# Patient Record
Sex: Male | Born: 1945 | Race: Asian | Hispanic: No | Marital: Married | Smoking: Former smoker
Health system: Southern US, Community
[De-identification: ages and names within clinical notes are randomized; demographics above are authoritative.]

## PROBLEM LIST (undated history)

## (undated) DIAGNOSIS — R918 Other nonspecific abnormal finding of lung field: Secondary | ICD-10-CM

## (undated) DIAGNOSIS — E119 Type 2 diabetes mellitus without complications: Secondary | ICD-10-CM

## (undated) DIAGNOSIS — Z87438 Personal history of other diseases of male genital organs: Secondary | ICD-10-CM

## (undated) DIAGNOSIS — J439 Emphysema, unspecified: Secondary | ICD-10-CM

## (undated) DIAGNOSIS — I519 Heart disease, unspecified: Secondary | ICD-10-CM

## (undated) DIAGNOSIS — E785 Hyperlipidemia, unspecified: Secondary | ICD-10-CM

## (undated) DIAGNOSIS — Z972 Presence of dental prosthetic device (complete) (partial): Secondary | ICD-10-CM

## (undated) DIAGNOSIS — J309 Allergic rhinitis, unspecified: Secondary | ICD-10-CM

## (undated) DIAGNOSIS — J432 Centrilobular emphysema: Secondary | ICD-10-CM

## (undated) DIAGNOSIS — R300 Dysuria: Secondary | ICD-10-CM

## (undated) DIAGNOSIS — R079 Chest pain, unspecified: Secondary | ICD-10-CM

## (undated) DIAGNOSIS — J449 Chronic obstructive pulmonary disease, unspecified: Secondary | ICD-10-CM

## (undated) DIAGNOSIS — I2510 Atherosclerotic heart disease of native coronary artery without angina pectoris: Secondary | ICD-10-CM

## (undated) DIAGNOSIS — Z8679 Personal history of other diseases of the circulatory system: Secondary | ICD-10-CM

## (undated) DIAGNOSIS — Z973 Presence of spectacles and contact lenses: Secondary | ICD-10-CM

## (undated) DIAGNOSIS — Z9861 Coronary angioplasty status: Secondary | ICD-10-CM

## (undated) DIAGNOSIS — I517 Cardiomegaly: Secondary | ICD-10-CM

## (undated) DIAGNOSIS — K08109 Complete loss of teeth, unspecified cause, unspecified class: Secondary | ICD-10-CM

## (undated) DIAGNOSIS — I252 Old myocardial infarction: Secondary | ICD-10-CM

## (undated) DIAGNOSIS — C679 Malignant neoplasm of bladder, unspecified: Secondary | ICD-10-CM

## (undated) DIAGNOSIS — I10 Essential (primary) hypertension: Secondary | ICD-10-CM

## (undated) DIAGNOSIS — R319 Hematuria, unspecified: Secondary | ICD-10-CM

## (undated) DIAGNOSIS — K219 Gastro-esophageal reflux disease without esophagitis: Secondary | ICD-10-CM

## (undated) DIAGNOSIS — I700 Atherosclerosis of aorta: Secondary | ICD-10-CM

## (undated) DIAGNOSIS — R0600 Dyspnea, unspecified: Secondary | ICD-10-CM

## (undated) DIAGNOSIS — J189 Pneumonia, unspecified organism: Secondary | ICD-10-CM

## (undated) DIAGNOSIS — R1030 Lower abdominal pain, unspecified: Secondary | ICD-10-CM

## (undated) HISTORY — DX: Atherosclerotic heart disease of native coronary artery without angina pectoris: I25.10

## (undated) HISTORY — DX: Coronary angioplasty status: Z98.61

## (undated) HISTORY — DX: Gastro-esophageal reflux disease without esophagitis: K21.9

## (undated) HISTORY — PX: OTHER SURGICAL HISTORY: SHX169

## (undated) HISTORY — DX: Essential (primary) hypertension: I10

## (undated) HISTORY — PX: COLONOSCOPY: SHX174

---
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
## 2000-06-05 ENCOUNTER — Encounter: Admission: RE | Admit: 2000-06-05 | Discharge: 2000-06-05 | Payer: Self-pay | Admitting: Family Medicine

## 2000-06-05 ENCOUNTER — Encounter: Payer: Self-pay | Admitting: Family Medicine

## 2002-01-08 HISTORY — PX: ESOPHAGOGASTRODUODENOSCOPY: SHX1529

## 2002-11-04 ENCOUNTER — Inpatient Hospital Stay (HOSPITAL_COMMUNITY): Admission: EM | Admit: 2002-11-04 | Discharge: 2002-11-06 | Payer: Self-pay | Admitting: Emergency Medicine

## 2003-01-12 ENCOUNTER — Inpatient Hospital Stay (HOSPITAL_COMMUNITY): Admission: EM | Admit: 2003-01-12 | Discharge: 2003-01-14 | Payer: Self-pay | Admitting: Critical Care Medicine

## 2004-05-08 ENCOUNTER — Ambulatory Visit: Payer: Self-pay | Admitting: Critical Care Medicine

## 2004-08-02 ENCOUNTER — Ambulatory Visit: Payer: Self-pay | Admitting: Critical Care Medicine

## 2004-11-07 ENCOUNTER — Ambulatory Visit: Payer: Self-pay | Admitting: Pulmonary Disease

## 2005-11-27 ENCOUNTER — Ambulatory Visit: Payer: Self-pay | Admitting: Critical Care Medicine

## 2006-01-03 ENCOUNTER — Ambulatory Visit: Payer: Self-pay | Admitting: Critical Care Medicine

## 2006-03-26 ENCOUNTER — Ambulatory Visit: Payer: Self-pay | Admitting: Critical Care Medicine

## 2006-05-15 ENCOUNTER — Ambulatory Visit: Payer: Self-pay | Admitting: Critical Care Medicine

## 2006-11-13 ENCOUNTER — Ambulatory Visit: Payer: Self-pay | Admitting: Critical Care Medicine

## 2006-11-13 DIAGNOSIS — J449 Chronic obstructive pulmonary disease, unspecified: Secondary | ICD-10-CM

## 2006-11-13 DIAGNOSIS — K219 Gastro-esophageal reflux disease without esophagitis: Secondary | ICD-10-CM

## 2007-03-20 ENCOUNTER — Ambulatory Visit: Payer: Self-pay | Admitting: Critical Care Medicine

## 2007-05-25 ENCOUNTER — Encounter: Payer: Self-pay | Admitting: Critical Care Medicine

## 2007-06-18 ENCOUNTER — Encounter: Payer: Self-pay | Admitting: Critical Care Medicine

## 2008-01-30 ENCOUNTER — Ambulatory Visit: Payer: Self-pay | Admitting: Internal Medicine

## 2008-02-17 ENCOUNTER — Ambulatory Visit: Payer: Self-pay | Admitting: Critical Care Medicine

## 2008-03-03 ENCOUNTER — Ambulatory Visit: Payer: Self-pay | Admitting: Critical Care Medicine

## 2008-03-23 ENCOUNTER — Encounter: Payer: Self-pay | Admitting: Critical Care Medicine

## 2008-04-14 ENCOUNTER — Ambulatory Visit: Payer: Self-pay | Admitting: Critical Care Medicine

## 2008-05-19 ENCOUNTER — Ambulatory Visit: Payer: Self-pay | Admitting: Critical Care Medicine

## 2008-05-25 ENCOUNTER — Telehealth (INDEPENDENT_AMBULATORY_CARE_PROVIDER_SITE_OTHER): Payer: Self-pay | Admitting: *Deleted

## 2008-08-06 ENCOUNTER — Ambulatory Visit: Payer: Self-pay | Admitting: Critical Care Medicine

## 2008-08-27 ENCOUNTER — Ambulatory Visit: Payer: Self-pay | Admitting: Critical Care Medicine

## 2008-10-12 ENCOUNTER — Telehealth: Payer: Self-pay | Admitting: Critical Care Medicine

## 2008-10-12 ENCOUNTER — Ambulatory Visit: Payer: Self-pay | Admitting: Critical Care Medicine

## 2008-11-09 ENCOUNTER — Telehealth: Payer: Self-pay | Admitting: Critical Care Medicine

## 2009-02-24 ENCOUNTER — Ambulatory Visit: Payer: Self-pay | Admitting: Critical Care Medicine

## 2009-05-02 ENCOUNTER — Ambulatory Visit: Payer: Self-pay | Admitting: Critical Care Medicine

## 2009-05-12 ENCOUNTER — Ambulatory Visit: Payer: Self-pay | Admitting: Critical Care Medicine

## 2009-05-13 ENCOUNTER — Encounter: Payer: Self-pay | Admitting: Critical Care Medicine

## 2009-05-31 ENCOUNTER — Inpatient Hospital Stay (HOSPITAL_COMMUNITY): Admission: AD | Admit: 2009-05-31 | Discharge: 2009-06-09 | Payer: Self-pay | Admitting: Critical Care Medicine

## 2009-05-31 ENCOUNTER — Ambulatory Visit: Payer: Self-pay | Admitting: Internal Medicine

## 2009-05-31 ENCOUNTER — Ambulatory Visit: Payer: Self-pay | Admitting: Critical Care Medicine

## 2009-05-31 DIAGNOSIS — J441 Chronic obstructive pulmonary disease with (acute) exacerbation: Secondary | ICD-10-CM

## 2009-06-02 ENCOUNTER — Encounter: Payer: Self-pay | Admitting: Cardiology

## 2009-06-07 ENCOUNTER — Encounter: Payer: Self-pay | Admitting: Internal Medicine

## 2009-06-08 ENCOUNTER — Encounter: Payer: Self-pay | Admitting: Critical Care Medicine

## 2009-06-08 ENCOUNTER — Ambulatory Visit: Payer: Self-pay | Admitting: Cardiovascular Disease

## 2009-06-08 HISTORY — PX: LEFT HEART CATH AND CORONARY ANGIOGRAPHY: CATH118249

## 2009-06-13 ENCOUNTER — Ambulatory Visit: Payer: Self-pay | Admitting: Critical Care Medicine

## 2009-06-19 ENCOUNTER — Inpatient Hospital Stay (HOSPITAL_COMMUNITY): Admission: EM | Admit: 2009-06-19 | Discharge: 2009-06-22 | Payer: Self-pay | Admitting: Emergency Medicine

## 2009-06-19 ENCOUNTER — Ambulatory Visit: Payer: Self-pay | Admitting: Internal Medicine

## 2009-06-19 DIAGNOSIS — I252 Old myocardial infarction: Secondary | ICD-10-CM

## 2009-06-19 HISTORY — DX: Old myocardial infarction: I25.2

## 2009-06-22 ENCOUNTER — Encounter: Payer: Self-pay | Admitting: Internal Medicine

## 2009-06-24 ENCOUNTER — Encounter: Payer: Self-pay | Admitting: Internal Medicine

## 2009-07-01 ENCOUNTER — Ambulatory Visit: Payer: Self-pay | Admitting: Critical Care Medicine

## 2009-07-20 ENCOUNTER — Ambulatory Visit: Payer: Self-pay | Admitting: Cardiology

## 2009-08-15 ENCOUNTER — Telehealth: Payer: Self-pay | Admitting: Internal Medicine

## 2009-08-31 ENCOUNTER — Ambulatory Visit: Payer: Self-pay | Admitting: Critical Care Medicine

## 2009-09-13 ENCOUNTER — Ambulatory Visit: Payer: Self-pay | Admitting: Internal Medicine

## 2010-02-05 LAB — CONVERTED CEMR LAB
ALT: 24 units/L (ref 0–53)
Albumin: 3.8 g/dL (ref 3.5–5.2)
Alkaline Phosphatase: 64 units/L (ref 39–117)
BUN: 13 mg/dL (ref 6–23)
CO2: 27 meq/L (ref 19–32)
Chloride: 108 meq/L (ref 96–112)
Cholesterol: 133 mg/dL (ref 0–200)
Glucose, Bld: 84 mg/dL (ref 70–99)
LDL Cholesterol: 68 mg/dL (ref 0–99)
Total CHOL/HDL Ratio: 4
Total Protein: 6.1 g/dL (ref 6.0–8.3)

## 2010-02-09 NOTE — Assessment & Plan Note (Signed)
Summary: 2 MONTH ROV/SL  Medications Added MIGRAINE RELIEF 250-250-65 MG TABS (ASPIRIN-ACETAMINOPHEN-CAFFEINE) as needed ASPIRIN 81 MG TABS (ASPIRIN) one daily      Allergies Added: NKDA  History of Present Illness: Mr. [name] 64 year old Asian male patient with a h/o severe COPD  who was admitted June 2011 with a non-ST elevation MI in the setting severe COPD flare. Underwent cardiac cath which showed non-obstructive CAD LAD 40-50%, LCX 40%, RCA 25% EF 65%. Readmitted 2 weeks later with CP and mildly elevated troponin 0.57. He had a Lexiscan Myoview prior to discharge showed an ejection fraction in 68% without evidence of ischemia or infarct.  Since that time has followed with Dr. Delford Field and says his breathing is much better. He walks 20-30 minutes each day with CP. He does get SOB if he goes fast. Compliant with all meds.  Not taking ASA. Says he is no longer smoking.   Current Medications (verified): 1)  Brovana 15 Mcg/30ml  Nebu (Arformoterol Tartrate) .... One in [place] Two Times A Day 2)  Qvar 80 Mcg/act  Aers (Beclomethasone Dipropionate) .... Three  Puffs Twice Daily 3)  Xopenex Hfa 45 Mcg/act  Aero (Levalbuterol Tartrate) .... One To Two Puff Q4h As Needed 4)  Prednisone 10 Mg  Tabs (Prednisone) .... Take One By Mouth Once Daily 5)  Spiriva Handihaler 18 Mcg Caps (Tiotropium Bromide Monohydrate) .... Inhale Contents of 1 Capsule Once A Day 6)  Omeprazole 20 Mg Cpdr (Omeprazole) .[name].. 1 Once Daily 7)  Nitrostat 0.4 Mg Subl (Nitroglycerin) .... As Directed Prn 8)  Amlodipine Besylate 5 Mg Tabs (Amlodipine Besylate) .[name].. 1 Once Daily 9)  Lipitor 80 Mg Tabs (Atorvastatin Calcium) .[name].. 1 Once Daily 10)  Imdur 30 Mg Xr24h-Tab (Isosorbide Mononitrate) .... 1/2 Tab Once Daily 11)  Migraine Relief 250-250-65 Mg Tabs (Aspirin-Acetaminophen-Caffeine) .... As Needed  Allergies (verified): No Known Drug Allergies  Past History:  Past Medical History: COPD, exsmoker GERD NSTEM 6/11 -  thought due to vasospasm    -- cath 6/11: CAD LAD 40-50%, LCX 40%, RCA 25% EF 65%.    -- Lexiscan Myoview 6/11: EF 68% without evidence of ischemia or infarct. HTN  Review of Systems       As per HPI and past medical history; otherwise all systems negative.   Vital Signs:  Patient profile:   65 year old male Height:      59 inches Weight:      129 pounds BMI:     26.15 Pulse rate:   82 / minute Resp:     16 per minute BP sitting:   134 / 86  (left arm)  Vitals Entered By: Marrion Coy, CNA (September 13, 2009 8:37 AM)  Physical Exam  General:   Well-nournished, in no acute distress. Neck: No JVD, HJR, Bruit, or thyroid enlargement. ? small cyst above sternal notch Lungs: Decreased breath sounds throughout. no wheezing Cardiovascular: RRR, PMI not displaced, heart sounds normal, no murmurs, + s4 Abdomen: BS normal. Soft without organomegaly, masses, lesions or tenderness. Extremities No edema or cyanosis. severe clubbing SKin: Warm, no lesions or rashes  Musculoskeletal: No deformities Neuro: no focal signs    Impression & Recommendations:  Problem # 1:  CAD (ICD-414.00) Non-obstructive. Continue risf factor modifcation. Add ASA 81. Also Imdur fo possible vasospasm.   Problem # 2:  Hyperlipidemia Goal LDL < 70. Continue lipitor. check labs today.   Problem # 3:  ENCOUNTER FOR LONG-TERM USE OF OTHER MEDICATIONS (ICD-V58.69)  Other  Orders: EKG w/ Interpretation (93000) TLB-BMP (Basic Metabolic Panel-BMET) (80048-METABOL) TLB-Lipid Panel (80061-LIPID) TLB-Hepatic/Liver Function Pnl (80076-HEPATIC)  Patient Instructions: 1)  Your physician recommends that you schedule a follow-up appointment in: 1 yr with Dr Gala Romney 2)  Your physician recommends that you have lab work today:  BMP  Lipid and Liver 3)  Your physician has recommended you make the following change in your medication: Start Asprin 81 mg a day

## 2010-02-09 NOTE — Assessment & Plan Note (Signed)
Summary: Admission History and Physical   CC:  Acute visit.  Pt c/o increased SOB x several days.  He also c/o chest tightness.  Pt states that he wakes in the night with SOB and sweating.  He has had prod cough with white sputum.[name]  History of Present Illness: This is a 65 year old Asian male here for follow-up of chronic obstructive lung disease. FEV1 1.07 -63%, ratio of 47 (2008)  The patient maintains nebulize therapy-Brovana  twice daily and chronic corticosteroids- Prednisone 10mg  daily and  Qvar 80  May 12, 2009--Presents for an acute office visit. Complains of worsening sob, wheezing, dry cough   Mucus is thick and hard to get up.  Last visit 2 weeks ago, tx w/ Avelox and steroid burst.  We called the pharmacy and verified he is filling meds on consistent basis. Denies chest pain, dyspnea, orthopnea, hemoptysis, fever, n/v/d, edema, headache  May 31, 2009 10:47 AM Pt unimproved.  Pt still with cough and wheeze.  Maintains prednisone and brovana.  Not any better. Still with dry cough.  Very tight in breathing.  Symptoms are progressively worse. Pt in office 4 times in last 6weeks without improvement. May 31, 2009 10:47 AM Still with cough,  feels dizzy and sweaty.  Not getting any air and is tight in the chest   Preventive Screening-Counseling & Management  Alcohol-Tobacco     Smoking Status: quit > 6 months  Current Medications (verified): 1)  Brovana 15 Mcg/73ml  Nebu (Arformoterol Tartrate) .... One in [place] Two Times A Day 2)  Qvar 80 Mcg/act  Aers (Beclomethasone Dipropionate) .... Three  Puffs Twice Daily 3)  Xopenex Hfa 45 Mcg/act  Aero (Levalbuterol Tartrate) .... One To Two Puff Q4h As Needed 4)  Omeprazole 20 Mg  Tbec (Omeprazole) .[name].. 1 By Mouth Daily 5)  Prednisone 10 Mg  Tabs (Prednisone) .... Take One By Mouth Once Daily  Allergies (verified): No Known Drug Allergies  Past History:  Past medical, surgical, family and social histories (including risk factors)  reviewed, and no changes noted (except as noted below).  Past Medical History: COPD, exsmoker GERD  Family History: Reviewed history from 03/20/2007 and no changes required. non contributory  Social History: Reviewed history from 03/20/2007 and no changes required. Patient states former smoker.   Review of Systems       The patient complains of shortness of breath with activity, shortness of breath at rest, productive cough, non-productive cough, and chest pain.  The patient denies coughing up blood, irregular heartbeats, acid heartburn, indigestion, loss of appetite, weight change, abdominal pain, difficulty swallowing, sore throat, tooth/dental problems, headaches, nasal congestion/difficulty breathing through nose, sneezing, itching, ear ache, anxiety, depression, hand/feet swelling, joint stiffness or pain, rash, change in color of mucus, and fever.    Vital Signs:  Patient profile:   65 year old male Weight:      123 pounds O2 Sat:      96 % on Room air Temp:     97.5 degrees F oral Pulse rate:   88 / minute BP sitting:   150 / 90  (left arm)  Vitals Entered By: Vernie Murders (May 31, 2009 10:33 AM)  O2 Flow:  Room air  Physical Exam  General:  dyspneic.   Head:  normocephalic and atraumatic Eyes:  PERRLA/EOM intact; conjunctiva and sclera clear Ears:  TMs intact and clear with normal canals Nose:  clear nasal discharge.   Mouth:  no deformity or lesions Neck:  no  masses, thyromegaly, or abnormal cervical nodes Chest Wall:  no deformities noted Lungs:  decreased BS bilateral and wheezes bilateral.   Heart:  regular rate and rhythm, S1, S2 without murmurs, rubs, gallops, or clicks Abdomen:  bowel sounds positive; abdomen soft and non-tender without masses, or organomegaly Genitalia:  normal male, testes descended bilaterally without masses, no hernias noted Msk:  no deformity or scoliosis noted with normal posture Pulses:  pulses normal Extremities:  no clubbing,  cyanosis, edema, or deformity noted Neurologic:  CN II-XII grossly intact with normal reflexes, coordination, muscle strength and tone Skin:  intact without lesions or rashes Cervical Nodes:  no significant adenopathy Axillary Nodes:  no significant adenopathy Inguinal Nodes:  no significant adenopathy Psych:  anxious.     Impression & Recommendations:  Problem # 1:  COPD (ICD-496) Assessment Deteriorated COPD exacerbation, failed outpatient treatment plan admit IV medrol IV rocephin oxygen cxr labs see orders  Complete Medication List: 1)  Brovana 15 Mcg/53ml Nebu (Arformoterol tartrate) .... One in neb two times a day 2)  Qvar 80 Mcg/act Aers (Beclomethasone dipropionate) .... Three  puffs twice daily 3)  Xopenex Hfa 45 Mcg/act Aero (Levalbuterol tartrate) .... One to two puff q4h as needed 4)  Omeprazole 20 Mg Tbec (Omeprazole) .[name].. 1 by mouth daily 5)  Prednisone 10 Mg Tabs (Prednisone) .... Take one by mouth once daily  Other Orders: No Charge Patient Arrived (NCPA0) (NCPA0)  Patient Instructions: 1)  You will be admitted for inpatient care at [facility]

## 2010-02-09 NOTE — Miscellaneous (Signed)
Summary: CXR   Clinical Lists Changes  Observations: Added new observation of CXR RESULTS: Findings: The chest is hyperexpanded but the lungs are clear. Heart size is normal.  No pleural effusion or focal bony abnormality.   IMPRESSION: COPD without acute disease. (05/12/2009 13:30)      CXR  Procedure date:  05/12/2009  Findings:      Findings: The chest is hyperexpanded but the lungs are clear. Heart size is normal.  No pleural effusion or focal bony abnormality.   IMPRESSION: COPD without acute disease.

## 2010-02-09 NOTE — Assessment & Plan Note (Signed)
Summary: Pulmonary OV   CC:  2 month follow up.  Pt states breathing and cough have improved.  Only having "a little" SOB when "working hard."  Coughing at times - occ prod with a small amount of white mucus.  Denies wheezing and chest tightness.  Pt states he is no longer smoking.[name]  History of Present Illness: 65 year old Asian male patient with a known history of chronic obstructive pulmonary disease with FEV1 of 63% associated CAD non obstructed with recent Non STEMI 6/11.  Hyperlipidemia and HTN   August 31, 2009 2:27 PM Now new issues. Less dyspneic. Less cough. Less mucus and no chest pain since on new cardiac meds. The pt is no longer smoking.   Pt denies any significant sore throat, nasal congestion or excess secretions, fever, chills, sweats, unintended weight loss, pleurtic or exertional chest pain, orthopnea PND, or leg swelling Pt denies any increase in rescue therapy over baseline, denies waking up needing it or having any early am or nocturnal exacerbations of coughing/wheezing/or dyspnea.    Preventive Screening-Counseling & Management  Alcohol-Tobacco     Smoking Status: current     Smoking Cessation Counseling: yes     Year Quit: 2011     Tobacco Counseling: to remain off tobacco products  Current Medications (verified): 1)  Brovana 15 Mcg/63ml  Nebu (Arformoterol Tartrate) .... One in [place] Two Times A Day 2)  Qvar 80 Mcg/act  Aers (Beclomethasone Dipropionate) .... Three  Puffs Twice Daily 3)  Xopenex Hfa 45 Mcg/act  Aero (Levalbuterol Tartrate) .... One To Two Puff Q4h As Needed 4)  Prednisone 10 Mg  Tabs (Prednisone) .... Take One By Mouth Once Daily 5)  Spiriva Handihaler 18 Mcg Caps (Tiotropium Bromide Monohydrate) .... Inhale Contents of 1 Capsule Once A Day 6)  Omeprazole 20 Mg Cpdr (Omeprazole) .[name].. 1 Once Daily 7)  Nitrostat 0.4 Mg Subl (Nitroglycerin) .... As Directed Prn 8)  Amlodipine Besylate 5 Mg Tabs (Amlodipine Besylate) .[name].. 1 Once Daily 9)  Lipitor 80  Mg Tabs (Atorvastatin Calcium) .[name].. 1 Once Daily 10)  Imdur 30 Mg Xr24h-Tab (Isosorbide Mononitrate) .... 1/2 Tab Once Daily  Allergies (verified): No Known Drug Allergies  Past History:  Past medical, surgical, family and social histories (including risk factors) reviewed, and no changes noted (except as noted below).  Past Medical History: Reviewed history from 05/31/2009 and no changes required. COPD, exsmoker GERD  Past Surgical History: Reviewed history from 07/19/2009 and no changes required. He has had his left foot and ankle repaired.      Family History: Reviewed history from 03/20/2007 and no changes required. non contributory  Social History: Reviewed history from 07/19/2009 and no changes required. Patient states former smoker.  He rolled his own cigarettes since the age of 68, but   quit smoking 6 months ago.  Occasional EtOH.  He is married, with 4   children.  He is disabled secondary to lung disease.   Review of Systems       The patient complains of shortness of breath with activity.  The patient denies shortness of breath at rest, productive cough, non-productive cough, coughing up blood, chest pain, irregular heartbeats, acid heartburn, indigestion, loss of appetite, weight change, abdominal pain, difficulty swallowing, sore throat, tooth/dental problems, headaches, nasal congestion/difficulty breathing through nose, sneezing, itching, ear ache, anxiety, depression, hand/feet swelling, joint stiffness or pain, rash, change in color of mucus, and fever.    Vital Signs:  Patient profile:   65 year old male  Height:      59 inches Weight:      129.50 pounds BMI:     26.25 O2 Sat:      96 % on Room air Temp:     97.9 degrees F oral Pulse rate:   89 / minute BP sitting:   126 / 76  (left arm) Cuff size:   regular  Vitals Entered By: Gweneth Dimitri RN (August 31, 2009 2:09 PM)  O2 Flow:  Room air CC: 2 month follow up.  Pt states breathing and cough have  improved.  Only having "a little" SOB when "working hard."  Coughing at times - occ prod with a small amount of white mucus.  Denies wheezing and chest tightness.  Pt states he is no longer smoking. Comments Medications reviewed with patient Daytime contact number verified with patient. 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   Maintain treatment program as currently prescribed.  Complete Medication List: 1)  Brovana 15 Mcg/6ml Nebu (Arformoterol tartrate) .... One in neb two times a day 2)  Qvar 80 Mcg/act Aers (Beclomethasone dipropionate) .... Three  puffs twice daily 3)  Xopenex Hfa 45 Mcg/act Aero (Levalbuterol tartrate) .... One to two puff q4h as needed 4)  Prednisone 10 Mg Tabs (Prednisone) .... Take one by mouth once daily 5)  Spiriva Handihaler 18 Mcg Caps (Tiotropium bromide monohydrate) .... Inhale contents of 1 capsule once a day 6)  Omeprazole 20 Mg Cpdr (Omeprazole) .[name].. 1 once daily 7)  Nitrostat 0.4 Mg Subl (Nitroglycerin) .... As directed prn 8)  Amlodipine Besylate 5 Mg Tabs (Amlodipine besylate) .[name].. 1 once daily 9)  Lipitor 80 Mg Tabs (Atorvastatin calcium) .[name].. 1 once daily 10)  Imdur 30 Mg Xr24h-tab (Isosorbide mononitrate) .... 1/2 tab once daily  Other Orders: Est. Patient Level III  (16109)  Patient Instructions: 1)  No change in medications 2)  Return in     4     months

## 2010-02-09 NOTE — Assessment & Plan Note (Signed)
Summary: Pulmonary  OV   CC:  2 month follow up.  c/o increased SOB, prod cough with white mucus, fever, and wheezing and chest tightness x 2wks.  .  History of Present Illness: This is a 65 year old Asian male here for follow-up of chronic obstructive lung disease.  The patient maintains nebulize therapy-Brovana  twice daily and chronic corticosteroids- Prednisone 10mg  daily .  Qvar 40 is  maintain twice daily as well.      October 12, 2008 9:24 AM Still coughing,  but is better.  Mucous is beige still.  Dyspnea is the same.  Some chest tightness  February 24, 2009 1:39 PM Still with cough and clear.  The mucous is not discolored.  The pt notes more dyspnea with exertion.   The pt notes more chest burning.     May 02, 2009 11:32 AM Not much bette,  bad days.  Pain in chest .  Yellow mucous still Cont to burn in chest  at last ov we: Avelox one daily for 5 days and stop Dexilant one a day until samples gone then omeprazole one daily At the last ov this helped significantly  Preventive Screening-Counseling & Management  Alcohol-Tobacco     Smoking Status: quit > 6 months  Current Medications (verified): 1)  Brovana 15 Mcg/21ml  Nebu (Arformoterol Tartrate) .... One in [place] Two Times A Day 2)  Qvar 80 Mcg/act  Aers (Beclomethasone Dipropionate) .... Three  Puffs Twice Daily 3)  Xopenex Hfa 45 Mcg/act  Aero (Levalbuterol Tartrate) .... One To Two Puff Q4h As Needed 4)  Omeprazole 20 Mg  Tbec (Omeprazole) .[name].. 1 By Mouth Daily 5)  Prednisone 10 Mg  Tabs (Prednisone) .... One Daily  Allergies (verified): No Known Drug Allergies  Past History:  Past medical, surgical, family and social histories (including risk factors) reviewed, and no changes noted (except as noted below).  Past Medical History: Reviewed history from 11/13/2006 and no changes required. Asthma GERD  Family History: Reviewed history from 03/20/2007 and no changes required. non contributory  Social  History: Reviewed history from 03/20/2007 and no changes required. Patient states former smoker.   Review of Systems       The patient complains of shortness of breath with activity, shortness of breath at rest, productive cough, and non-productive cough.  The patient denies coughing up blood, chest pain, irregular heartbeats, acid heartburn, indigestion, loss of appetite, weight change, abdominal pain, difficulty swallowing, sore throat, tooth/dental problems, headaches, nasal congestion/difficulty breathing through nose, sneezing, itching, ear ache, anxiety, depression, hand/feet swelling, joint stiffness or pain, rash, change in color of mucus, and fever.    Vital Signs:  Patient profile:   65 year old male Height:      59 inches Weight:      120.13 pounds BMI:     24.35 O2 Sat:      97 % on Room air Temp:     97.4 degrees F oral Pulse rate:   71 / minute BP sitting:   126 / 80  (left arm) Cuff size:   regular  Vitals Entered By: Gweneth Dimitri RN (May 02, 2009 11:20 AM)  O2 Flow:  Room air CC: 2 month follow up.  c/o increased SOB, prod cough with white mucus, fever, wheezing and chest tightness x 2wks.   Comments Medications reviewed with patient Daytime contact number verified with patient. Gweneth Dimitri RN  May 02, 2009 11:21 AM    Physical Exam  Additional Exam:  Gen: Pleasant, well-nourished, in no distress , normal affect ENT: no lesions, no post nasal drip Neck: No JVD, no TMG, no carotid bruits Lungs: No use of accessory muscles, no dullness to percussion, distant BS, scattered rhonchi and expiratory wheeze Cardiovascular: RRR, heart sounds normal, no murmurs or gallops, no peripheral edema Abdomen: soft and non-tender, no HSM, BS normal Musculoskeletal: No deformities, no cyanosis or clubbing Neuro: alert, non-focal     Impression & Recommendations:  Problem # 1:  OBSTRUCTIVE CHRONIC BRONCHITIS (ICD-491.20) Assessment Deteriorated acute  tracheobronchitis with COPD exacerbation. Plan Avelox for 5 days. Pulse prednisone Continue inhaled medications as prescribed Orders: Est. Patient Level IV (04540) Prescription Created Electronically [phone])  Medications Added to Medication List This Visit: 1)  Prednisone 10 Mg Tabs (Prednisone) .... 4 each am x 4 days, 3 x 4 days, 2 x 4 days, then one a day and stay 2)  Avelox 400 Mg Tabs (Moxifloxacin hcl) .... By mouth daily 3)  Dexilant 60 Mg Cpdr (Dexlansoprazole) .... One by mouth daily until samples gone then restart omeprazole daily  Complete Medication List: 1)  Brovana 15 Mcg/41ml Nebu (Arformoterol tartrate) .... One in neb two times a day 2)  Qvar 80 Mcg/act Aers (Beclomethasone dipropionate) .... Three  puffs twice daily 3)  Xopenex Hfa 45 Mcg/act Aero (Levalbuterol tartrate) .... One to two puff q4h as needed 4)  Omeprazole 20 Mg Tbec (Omeprazole) .[name].. 1 by mouth daily 5)  Prednisone 10 Mg Tabs (Prednisone) .... 4 each am x 4 days, 3 x 4 days, 2 x 4 days, then one a day and stay 6)  Avelox 400 Mg Tabs (Moxifloxacin hcl) .... By mouth daily 7)  Dexilant 60 Mg Cpdr (Dexlansoprazole) .... One by mouth daily until samples gone then restart omeprazole daily  Patient Instructions: 1)  Avelox one daily for 5 days and stop 2)  Dexilant one a day until samples gone then omeprazole one daily 3)  Prednisone 10mg  4 each am x 4 days, 3 x 4 days, 2 x 4 days, then resume one daily 4)  No change in inhalers/nebulizer medications 5)  Return 2 months  Prescriptions: PREDNISONE 10 MG  TABS (PREDNISONE) 4 each am x 4 days, 3 x 4 days, 2 x 4 days, then one a day and stay  #100 x 6   Entered and Authorized by:   Storm Frisk MD   Signed by:   Storm Frisk MD on 05/02/2009   Method used:   Electronically to        [facility] [address] [phone]* (retail)       [address]       [place], [place]  [address]       Ph: [phone] or [phone]       Fax:  [phone]   RxID:   [id number]

## 2010-02-09 NOTE — Assessment & Plan Note (Signed)
Summary: SOB///kp   CC:  c/o worsening sob and  1 week - fatigue.  History of Present Illness: This is a 65 year old Asian male here for follow-up of chronic obstructive lung disease. FEV1 1.07 -63%, ratio of 47 (2008)  The patient maintains nebulize therapy-Brovana  twice daily and chronic corticosteroids- Prednisone 10mg  daily and  Qvar 80   October 12, 2008 9:24 AM Still coughing,  but is better.  Mucous is beige still.  Dyspnea is the same.  Some chest tightness  February 24, 2009 1:39 PM Still with cough and clear.  The mucous is not discolored.  The pt notes more dyspnea with exertion.   The pt notes more chest burning.     May 02, 2009 11:32 AM Not much bette,  bad days.  Pain in chest .  Yellow mucous still Cont to burn in chest  at last ov we: Avelox one daily for 5 days and stop Dexilant one a day until samples gone then omeprazole one daily At the last ov this helped significantly  May 12, 2009--Presents for an acute office visit. Complains of worsening sob, wheezing, dry cough   Mucus is thick and hard to get up.  Last visit 2 weeks ago, tx w/ Avelox and steroid burst.  We called the pharmacy and verified he is filling meds on consistent basis. Denies chest pain, dyspnea, orthopnea, hemoptysis, fever, n/v/d, edema, headache  Current Medications (verified): 1)  Brovana 15 Mcg/58ml  Nebu (Arformoterol Tartrate) .... One in [place] Two Times A Day 2)  Qvar 80 Mcg/act  Aers (Beclomethasone Dipropionate) .... Three  Puffs Twice Daily 3)  Xopenex Hfa 45 Mcg/act  Aero (Levalbuterol Tartrate) .... One To Two Puff Q4h As Needed 4)  Omeprazole 20 Mg  Tbec (Omeprazole) .[name].. 1 By Mouth Daily 5)  Prednisone 10 Mg  Tabs (Prednisone) .... Take One By Mouth Once Daily  Allergies (verified): No Known Drug Allergies  Comments:  Nurse/Medical Assistant: The patient's medications and allergies were reviewed with the patient and were updated in the Medication and Allergy Lists.  Past  History:  Past Medical History: Last updated: 11/13/2006 Asthma GERD  Family History: Last updated: 03/20/2007 non contributory  Social History: Last updated: 03/20/2007 Patient states former smoker.   Risk Factors: Smoking Status: quit > 6 months (05/02/2009)  Review of Systems      See HPI  Vital Signs:  Patient profile:   65 year old male Height:      59 inches Weight:      120 pounds BMI:     24.32 O2 Sat:      96 % on Room air Temp:     97.1 degrees F oral Pulse rate:   87 / minute BP sitting:   142 / 86  (left arm) Cuff size:   regular  Vitals Entered By: Abigail Miyamoto RN (May 12, 2009 10:14 AM)  O2 Flow:  Room air  Physical Exam  Additional Exam:  Gen: Pleasant, well-nourished, in no distress , normal affect ENT: no lesions, no post nasal drip Neck: No JVD, no TMG, no carotid bruits Lungs: No use of accessory muscles, no dullness to percussion, distant BS, faint exp wheeze  Cardiovascular: RRR, heart sounds normal, no murmurs or gallops, no peripheral edema Abdomen: soft and non-tender, no HSM, BS normal Musculoskeletal: No deformities, no cyanosis or clubbing Neuro: alert, non-focal     Impression & Recommendations:  Problem # 1:  OBSTRUCTIVE CHRONIC BRONCHITIS (ICD-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 CXR today reviewed w/ pt w/ no acute process noted.  REC:  Increase Prednisone 10mg  4 tabs once daily 4 days, 3 tabs daily for 4 days, 3 tabs x 4 days, 2 tabs x 4 days then 1 by mouth once daily  Dexilant 60mg  once daily until samples are gone then get over the counter Prilosec 20mg  once daily  Mucinex DM two times a day as needed cough/congestion Begin Zyrtec 10mg  at bedtime -this is over the counter  Please contact office for sooner follow up if symptoms do not improve or worsen  follow up Dr. Delford Field in 6 weeks  Orders: T-2 View CXR (71020TC) Est. Patient Level IV (11914)  Medications Added to Medication List This Visit: 1)  Prednisone 10 Mg Tabs  (Prednisone) .... Take one by mouth once daily 2)  Prednisone 10 Mg Tabs (Prednisone) .... 4 tabs for4 days, then 3 tabs for 4 days, 2 tabs for 4 days, then 1 tab for 4 days, then stop  Complete Medication List: 1)  Brovana 15 Mcg/53ml Nebu (Arformoterol tartrate) .... One in neb two times a day 2)  Qvar 80 Mcg/act Aers (Beclomethasone dipropionate) .... Three  puffs twice daily 3)  Xopenex Hfa 45 Mcg/act Aero (Levalbuterol tartrate) .... One to two puff q4h as needed 4)  Omeprazole 20 Mg Tbec (Omeprazole) .[name].. 1 by mouth daily 5)  Prednisone 10 Mg Tabs (Prednisone) .... Take one by mouth once daily 6)  Prednisone 10 Mg Tabs (Prednisone) .... 4 tabs for4 days, then 3 tabs for 4 days, 2 tabs for 4 days, then 1 tab for 4 days, then stop  Other Orders: Nebulizer Tx (78295)  Patient Instructions: 1)  Increase Prednisone 10mg  4 tabs once daily 4 days, 3 tabs daily for 4 days, 3 tabs x 4 days, 2 tabs x 4 days then 1 by mouth once daily  2)  Dexilant 60mg  once daily until samples are gone then get over the counter Prilosec 20mg  once daily  3)  Mucinex DM two times a day as needed cough/congestion 4)  Begin Zyrtec 10mg  at bedtime -this is over the counter  5)  Please contact office for sooner follow up if symptoms do not improve or worsen  6)  follow up Dr. Delford Field in 6 weeks  Prescriptions: PREDNISONE 10 MG TABS (PREDNISONE) 4 tabs for4 days, then 3 tabs for 4 days, 2 tabs for 4 days, then 1 tab for 4 days, then stop  #40 x 0   Entered and Authorized by:   Rubye Oaks NP   Signed by:   Rubina Basinski NP on 05/12/2009   Method used:   [facility]        [facility] [address] [phone]* (retail)       [address]       [place], [place]  [address]       Ph: [phone] or [phone]       Fax: [phone]   RxID:   [id number]      Medication Administration  Medication # 1:    Medication: Xopenex 1.25mg     Diagnosis: ASTHMA (ICD-493.90)    Dose:  1.25mg     Route: inhaled    Exp Date: 09-11    Lot #: [id number]    Mfr: SEPRACOR    Patient tolerated medication without complications    Given by: Elray Buba RN (May 12, 2009 12:05 PM)  Orders Added: 1)  T-2 View CXR [71020TC] 2)  Nebulizer Tx [id number] 3)  Est. Patient Level IV [21308]

## 2010-02-09 NOTE — Assessment & Plan Note (Signed)
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 Cardiac catheterization was not done because he had had a catheter June 09, 2009 revealing minimal nonobstructive coronary artery disease. Medical management was recommended. He also had a Lexus scan Myoview prior to discharge showed an ejection fraction in 68% without evidence of ischemia or infarct.  The patient stopped all his medications when they ran out. He complains of daily chest tightness with very little activity such as cooking or cleaning around the house. He denies radiation of the pain to his neck or arm but does have associated dyspnea. He denies diaphoresis dizziness or presyncope. The patient did not seem to understand the need to continue on his medications.  I also spoke with the patient about smoking cessation but he claims that he has stopped for the past 6 months.  Current Medications (verified): 1)  Brovana 15 Mcg/58ml  Nebu (Arformoterol Tartrate) .... One in [place] Two Times A Day 2)  Qvar 80 Mcg/act  Aers (Beclomethasone Dipropionate) .... Three  Puffs Twice Daily 3)  Xopenex Hfa 45 Mcg/act  Aero (Levalbuterol Tartrate) .... One To Two Puff Q4h As Needed 4)  Prednisone 10 Mg  Tabs (Prednisone) .... Take One By Mouth Once Daily 5)  Spiriva Handihaler 18 Mcg Caps (Tiotropium Bromide Monohydrate) .... Inhale Contents of 1 Capsule Once A Day 6)  Omeprazole 20 Mg Cpdr (Omeprazole) .[name].. 1 Once Daily  Allergies: No Known Drug Allergies  Past History:  Past Medical History: Last updated:  05/31/2009 COPD, exsmoker GERD  Social History: Reviewed history from 07/19/2009 and no changes required. Patient states former smoker.  He rolled his own cigarettes since the age of 65, but   quit smoking 6 months ago.  Occasional EtOH.  He is married, with 4   children.  He is disabled secondary to lung disease.   Review of Systems       see history of present illness  Vital Signs:  Patient profile:   65 year old male Height:      59 inches Weight:      131 pounds BMI:     26.55 Pulse rate:   86 / minute Pulse rhythm:   regular BP sitting:   149 / 92  (left arm) Cuff size:   regular  Physical Exam  General:   Well-nournished, in no acute distress. Neck: No JVD, HJR, Bruit, or thyroid enlargement Lungs: No tachypnea, clear without wheezing, rales, or rhonchi Cardiovascular: RRR, PMI not displaced, heart sounds normal, no murmurs, gallops, bruit, thrill, or heave. Abdomen: BS normal. Soft without organomegaly, masses, lesions or tenderness. Extremities: right radial artery without hematoma or hemorrhage good brachial pulses, lower extremities without cyanosis, clubbing or edema. Good distal pulses bilateral SKin: Warm, no lesions or rashes  Musculoskeletal: No deformities Neuro: no focal signs    Impression & Recommendations:  Problem # 1:  CAD (ICD-414.00) Patient had a non-ST elevation MI June 19, 2009 felt secondary to vasospasm. Follow up with the scan Myoview showed no ischemia or infarct ejection fraction 68%. Prior catheter June 09, 2009  showed minimal nonobstructive coronary artery disease. I will resume his amlodipine, aspirin, atorvastatin, Imdur, and nitroglycerin p.r.n. Have asked the patient to stand these medications in hopes of the lemonade and his chest pain. The following medications were removed from the medication list:    Norvasc 5 Mg Tabs (Amlodipine besylate) .[name]... Take 1 tablet by mouth once a day    Aspirin 81 Mg Tbec (Aspirin) .[name]... Take 1 tablet  by mouth once a day    Imdur 30 Mg Xr24h-tab (Isosorbide mononitrate) .[name]... 1/2 tab by mouth once daily    Nitrostat 0.4 Mg Subl (Nitroglycerin) .[name]... As needed His updated medication list for this problem includes:    Nitrostat 0.4 Mg Subl (Nitroglycerin) .[name]... As directed prn    Amlodipine Besylate 5 Mg Tabs (Amlodipine besylate) .[name]... 1 once daily    Imdur 30 Mg Xr24h-tab (Isosorbide mononitrate) .[name]... 1/2 tab once daily  Problem # 2:  COPD (ICD-496) Patient quit smoking His updated medication list for this problem includes:    Brovana 15 Mcg/44ml Nebu (Arformoterol tartrate) ..... One in neb two times a day    Qvar 80 Mcg/act Aers (Beclomethasone dipropionate) .[name]... Three  puffs twice daily    Xopenex Hfa 45 Mcg/act Aero (Levalbuterol tartrate) ..... One to two puff q4h as needed    Spiriva Handihaler 18 Mcg Caps (Tiotropium bromide monohydrate) ..... Inhale contents of 1 capsule once a day  Patient Instructions: 1)  Your physician recommends that you schedule a follow-up appointment in: 2 MONTHS WITH DR BENSIMHON 2)  Your physician has recommended you make the following change in your medication:  3)  AMLODIPINE 5 MG 1 once daily 4)  ATORVASTATIN 80 MG 1 once daily 5)  IMDUR 30 MG 1/2 TAB once daily 6)  Your physician discussed the risks, benefits and indications for preventive aspirin therapy. It is recommended that you start (or continue) taking 81 mg of aspirin a day. 7)  Your physician recommended you take 1 tablet (or 1 spray) under tongue at onset of chest pain; you may repeat every 5 minutes for up to 3 doses. If 3 or more doses are required, call 911 and proceed to the ER immediately. Prescriptions: IMDUR 30 MG XR24H-TAB (ISOSORBIDE MONONITRATE) 1/2 TAB once daily  #30 x 11   Entered by:   Scherrie Bateman, LPN   Authorized by:   Marletta Lor, PA-C   Signed by:   Scherrie Bateman, LPN on 16/10/9602   Method used:   [facility]        [facility] [address]  [phone]* (retail)       [address]       [place], [place]  [address]       Ph: [phone] or [phone]       Fax: [phone]   RxID:   [id number] LIPITOR 80 MG TABS (ATORVASTATIN CALCIUM) 1 once daily  #30 x 11   Entered by:   Scherrie Bateman, LPN   Authorized by:   Marletta Lor, PA-C   Signed by:   Scherrie Bateman, LPN on 74/25/9563   Method used:   [facility]ronically to        [facility] [address] [phone]* (retail)       [address]       [place], [place]  [address]       Ph: [phone] or [phone]  Fax: [phone]   RxID:   [id number] AMLODIPINE BESYLATE 5 MG TABS (AMLODIPINE BESYLATE) 1 once daily  #30 x 11   Entered by:   Scherrie Bateman, LPN   Authorized by:   Marletta Lor, PA-C   Signed by:   Scherrie Bateman, LPN on 73/22/0254   Method used:   Electronically to        [facility] [address] [phone]* (retail)       [address]       [place], [place]  [address]       Ph: [phone] or [phone]       Fax: [phone]   RxID:   [id number] NITROSTAT 0.4 MG SUBL (NITROGLYCERIN) AS DIRECTED PRN  #25 x 4   Entered by:   Scherrie Bateman, LPN   Authorized by:   Marletta Lor, PA-C   Signed by:   Scherrie Bateman, LPN on 89/38/1017   Method used:   Electronically to        [facility] [address] [phone]* (retail)       [address]       [place], [place]  [address]       Ph: [phone] or [phone]       Fax: [phone]   RxID:   [id number]

## 2010-02-09 NOTE — Assessment & Plan Note (Signed)
Summary: NP follow up - post hosp   CC:  post hosp follow up -.  History of Present Illness: 65 year old Asian male patient with a known history of chronic obstructive pulmonary disease with FEV1 of 63%    Presents for post hospital follow up. Admitted 05/31/2009-  06/09/2009 for Acute exacerbation of chronic obstructive pulmonary disease.and  Nonobstructive coronary artery disease. He had atypical chest pain and dyspnea.  :  He underwent a cardiac catheterization on June 08, 2009.  The findings demonstrated the left main was widely patent, the LAD demonstrated 40 to 50% stenosis, and the left circumflex was widelypatent with a 40% stenosis in the mid circ.  Essentially, the study demonstrates diffuse nonobstructive coronary artery disease.   Tx w/  empiric antibiotics, inhaled bronchodilators,    and prednisone burst taper.  Sputum cultures were obtained and   these were negative.  There was no evidence of infection; however,     he did complete a 7-day course of empiric Avelox. Discharged on steroid taper.  He is feeling so much beter. "the best in long time" .  post hosp follow up - states breathing is doing but "still hurts" in chest occasionally and weaknessAcute exacerbation of chronic obstructive pulmonary disease.  Mr.     Jason Nest was admitted to regular medical floor.  The therapeutic     interventions include empiric antibiotics, inhaled bronchodilators,     and prednisone burst taper.  Sputum cultures were obtained and     these were negative.  There was no evidence of infection; however,     he did complete a 7-day course of empiric Avelox.  He was     successfully tapered on to oral prednisone.  From a pulmonary     standpoint, he had improved; however, he continues to complain of     exertional chest tightness.  Because of this, Cardiology was     consulted. 2. Chest tightness with nonobstructive coronary artery disease     identified by cardiac catheterization on June 08, 2009.  Mr.  Jason Nest     does have a nonobstructive coronary artery disease.  This was     identified by a cardiac catheterization.  Recommendations are for     medical management including blood pressure control, aspirin, and     control of hyperlipidemia. 3. Anxiety.  It is felt that this is a large contributing factor to     Mr. Myrna Blazer chief complaints.  Because of this, he will be going     home with p.r.n. clonazepam. 4. Musculoskeletal chest discomfort.  No evidence of ischemia.  For     this, he will be treated symptomatically. 5. Hyperlipidemia.  For this, he will be started on a statin     medication.  See discharge medication list. 6. Hypertension.  See discharge medication list.      Medications Prior to Update: 1)  Brovana 15 Mcg/1ml  Nebu (Arformoterol Tartrate) .... One in [place] Two Times A Day 2)  Qvar 80 Mcg/act  Aers (Beclomethasone Dipropionate) .... Three  Puffs Twice Daily 3)  Xopenex Hfa 45 Mcg/act  Aero (Levalbuterol Tartrate) .... One To Two Puff Q4h As Needed 4)  Omeprazole 20 Mg  Tbec (Omeprazole) .[name].. 1 By Mouth Daily 5)  Prednisone 10 Mg  Tabs (Prednisone) .... Take One By Mouth Once Daily  Current Medications (verified): 1)  Brovana 15 Mcg/46ml  Nebu (Arformoterol Tartrate) .... One in [place] Two Times A Day 2)  Qvar 80  Mcg/act  Aers (Beclomethasone Dipropionate) .... Three  Puffs Twice Daily 3)  Xopenex Hfa 45 Mcg/act  Aero (Levalbuterol Tartrate) .... One To Two Puff Q4h As Needed 4)  Omeprazole 20 Mg  Tbec (Omeprazole) .[name].. 1 By Mouth Daily 5)  Prednisone 10 Mg  Tabs (Prednisone) .... Take One By Mouth Once Daily 6)  Norvasc 5 Mg Tabs (Amlodipine Besylate) .... Take 1 Tablet By Mouth Once A Day 7)  Aspirin 81 Mg Tbec (Aspirin) .... Take 1 Tablet By Mouth Once A Day 8)  Lipitor 80 Mg Tabs (Atorvastatin Calcium) .... Take 1 Tab By Mouth At Bedtime 9)  Clonazepam 0.5 Mg Tabs (Clonazepam) .... Take 1 Tablet By Mouth Two Times A Day As Needed Anxiety 10)  Mucinex 600 Mg Xr12h-Tab  (Guaifenesin) .... 2 Tabs By Mouth Two Times A Day 11)  Hydrocodone-Acetaminophen 5-325 Mg Tabs (Hydrocodone-Acetaminophen) .[name].. 1 Tab By Mouth Every 6 Hours As Needed Pain 12)  Imdur 30 Mg Xr24h-Tab (Isosorbide Mononitrate) .... 1/2 Tab By Mouth Once Daily 13)  Spiriva Handihaler 18 Mcg Caps (Tiotropium Bromide Monohydrate) .... Inhale Contents of 1 Capsule Once A Day  Allergies (verified): No Known Drug Allergies  Past History:  Past Medical History: Last updated: 05/31/2009 COPD, exsmoker GERD  Family History: Last updated: 03/20/2007 non contributory  Social History: Last updated: 03/20/2007 Patient states former smoker.   Risk Factors: Smoking Status: quit > 6 months (05/31/2009)  Review of Systems      See HPI  Vital Signs:  Patient profile:   65 year old male Height:      59 inches Weight:      130 pounds BMI:     26.35 O2 Sat:      98 % on Room air Temp:     98.9 degrees F oral Pulse rate:   82 / minute BP sitting:   130 / 68  (left arm) Cuff size:   regular  Vitals Entered By: Boone Master CNA/MA (June 13, 2009 10:17 AM)  O2 Flow:  Room air CC: post hosp follow up - Is Patient Diabetic? No Comments Medications reviewed with patient Daytime contact number verified with patient. Boone Master CNA/MA  June 13, 2009 10:17 AM    Physical Exam  Additional Exam:  Gen: Pleasant, well-nourished, in no distress , normal affect ENT: no lesions, no post nasal drip Neck: No JVD, no TMG, no carotid bruits Lungs: No use of accessory muscles, no dullness to percussion, distant BS, no wheezing  Cardiovascular: RRR, heart sounds normal, no murmurs or gallops, no peripheral edema Abdomen: soft and non-tender, no HSM, BS normal Musculoskeletal: No deformities, no cyanosis or clubbing Neuro: alert, non-focal     Impression & Recommendations:  Problem # 1:  COPD (ICD-496) Recent exacerbation now resolving.  cont on current regimen.  REC:  Taper prednisone  10mg  as directed-down to 10mg  once daily and hold until seen back in office w/ Dr. Delford Field  follow up Dr. Delford Field in 1 week as scheduled.     Problem # 2:  CAD (ICD-414.00)  cont on current regimen follow up cards as scheulded recent cath for atypical chest pain  c./w nonobstructive CAD cont medical managemnt.  His updated medication list for this problem includes:    Norvasc 5 Mg Tabs (Amlodipine besylate) .[name]... Take 1 tablet by mouth once a day    Aspirin 81 Mg Tbec (Aspirin) .[name]... Take 1 tablet by mouth once a day    Imdur 30 Mg Xr24h-tab (  Isosorbide mononitrate) .[name]... 1/2 tab by mouth once daily  Orders: Est. Patient Level III ([address])  Medications Added to Medication List This Visit: 1)  Norvasc 5 Mg Tabs (Amlodipine besylate) .... Take 1 tablet by mouth once a day 2)  Aspirin 81 Mg Tbec (Aspirin) .... Take 1 tablet by mouth once a day 3)  Lipitor 80 Mg Tabs (Atorvastatin calcium) .... Take 1 tab by mouth at bedtime 4)  Clonazepam 0.5 Mg Tabs (Clonazepam) .... Take 1 tablet by mouth two times a day as needed anxiety 5)  Mucinex 600 Mg Xr12h-tab (Guaifenesin) .... 2 tabs by mouth two times a day 6)  Hydrocodone-acetaminophen 5-325 Mg Tabs (Hydrocodone-acetaminophen) .[name].. 1 tab by mouth every 6 hours as needed pain 7)  Imdur 30 Mg Xr24h-tab (Isosorbide mononitrate) .... 1/2 tab by mouth once daily 8)  Spiriva Handihaler 18 Mcg Caps (Tiotropium bromide monohydrate) .... Inhale contents of 1 capsule once a day  Complete Medication List: 1)  Brovana 15 Mcg/67ml Nebu (Arformoterol tartrate) .... One in neb two times a day 2)  Qvar 80 Mcg/act Aers (Beclomethasone dipropionate) .... Three  puffs twice daily 3)  Xopenex Hfa 45 Mcg/act Aero (Levalbuterol tartrate) .... One to two puff q4h as needed 4)  Omeprazole 20 Mg Tbec (Omeprazole) .[name].. 1 by mouth daily 5)  Prednisone 10 Mg Tabs (Prednisone) .... Take one by mouth once daily 6)  Norvasc 5 Mg Tabs (Amlodipine besylate) .... Take 1 tablet  by mouth once a day 7)  Aspirin 81 Mg Tbec (Aspirin) .... Take 1 tablet by mouth once a day 8)  Lipitor 80 Mg Tabs (Atorvastatin calcium) .... Take 1 tab by mouth at bedtime 9)  Clonazepam 0.5 Mg Tabs (Clonazepam) .... Take 1 tablet by mouth two times a day as needed anxiety 10)  Mucinex 600 Mg Xr12h-tab (Guaifenesin) .... 2 tabs by mouth two times a day 11)  Hydrocodone-acetaminophen 5-325 Mg Tabs (Hydrocodone-acetaminophen) .[name].. 1 tab by mouth every 6 hours as needed pain 12)  Imdur 30 Mg Xr24h-tab (Isosorbide mononitrate) .... 1/2 tab by mouth once daily 13)  Spiriva Handihaler 18 Mcg Caps (Tiotropium bromide monohydrate) .... Inhale contents of 1 capsule once a day  Patient Instructions: 1)  Taper prednisone 10mg  as directed-down to 10mg  once daily and hold until seen back in office w/ Dr. Delford Field  2)  follow up Dr. Delford Field in 1 week as scheduled.  3)  You are doing a great job-keep up the good work.  4)  We are glad you are feeling better.  5)  Please contact office for sooner follow up if symptoms do not improve or worsen

## 2010-02-09 NOTE — Consult Note (Signed)
Summary: [facility]   Imported By: Roderic Ovens 07/05/2009 14:11:11  _____________________________________________________________________  External Attachment:    Type:   Image     Comment:   External Document

## 2010-02-09 NOTE — Assessment & Plan Note (Signed)
Summary: Pulmonary OV   CC:  4 mo follow up.  states no changes in breathing. states he is coughing-productive with clear mucus and having burning in chest and stomach when eating hot/spicey foods.  requesting rxs for omeprazole and levaquin.[name]  History of Present Illness: This is a 65 year old Asian male here for follow-up of chronic obstructive lung disease.  The patient maintains nebulize therapy-Brovana  twice daily and chronic corticosteroids- Prednisone 10mg  daily .  Qvar 40 is  maintain twice daily as well.      October 12, 2008 9:24 AM Still coughing,  but is better.  Mucous is beige still.  Dyspnea is the same.  Some chest tightness  February 24, 2009 1:39 PM Still with cough and clear.  The mucous is not discolored.  The pt notes more dyspnea with exertion.   The pt notes more chest burning.        Preventive Screening-Counseling & Management  Alcohol-Tobacco     Smoking Status: quit > 6 months  Current Medications (verified): 1)  Brovana 15 Mcg/19ml  Nebu (Arformoterol Tartrate) .... One in [place] Two Times A Day 2)  Qvar 80 Mcg/act  Aers (Beclomethasone Dipropionate) .... Three  Puffs Twice Daily 3)  Xopenex Hfa 45 Mcg/act  Aero (Levalbuterol Tartrate) .... One To Two Puff Q4h As Needed 4)  Omeprazole 20 Mg  Tbec (Omeprazole) .[name].. 1 By Mouth Daily 5)  Prednisone 10 Mg  Tabs (Prednisone) .... Take As Directed 4 Each Am X3days, 3 X 3days, 2 X 3days, Then One A Day and Stay 6)  Levaquin 750 Mg  Tabs (Levofloxacin) .... One Tablet By Mouth Daily  Allergies (verified): No Known Drug Allergies  Past History:  Past medical, surgical, family and social histories (including risk factors) reviewed, and no changes noted (except as noted below).  Past Medical History: Reviewed history from 11/13/2006 and no changes required. Asthma GERD  Family History: Reviewed history from 03/20/2007 and no changes required. non contributory  Social History: Reviewed history from  03/20/2007 and no changes required. Patient states former smoker.   Review of Systems       The patient complains of shortness of breath with activity, productive cough, non-productive cough, chest pain, acid heartburn, and indigestion.  The patient denies shortness of breath at rest, coughing up blood, irregular heartbeats, loss of appetite, weight change, abdominal pain, difficulty swallowing, sore throat, tooth/dental problems, headaches, nasal congestion/difficulty breathing through nose, sneezing, itching, ear ache, anxiety, depression, hand/feet swelling, joint stiffness or pain, rash, change in color of mucus, and fever.    Vital Signs:  Patient profile:   65 year old male Height:      59 inches Weight:      124 pounds BMI:     25.14 O2 Sat:      95 % on Room air Temp:     97.9 degrees F oral Pulse rate:   83 / minute BP sitting:   134 / 74  (left arm) Cuff size:   regular  Vitals Entered By: Gweneth Dimitri RN (February 24, 2009 1:30 PM)  O2 Flow:  Room air CC: 4 mo follow up.  states no changes in breathing. states he is coughing-productive with clear mucus and having burning in chest and stomach when eating hot/spicey foods.  requesting rxs for omeprazole and levaquin. Comments Medications reviewed with patient Daytime contact number verified with patient. 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 Patient Level IV ([address]109)  Medications Added to Medication List This Visit: 1)  Omeprazole 20 Mg Tbec (Omeprazole) .[name].. 1 by mouth daily 2)  Prednisone 10 Mg Tabs (Prednisone) .... One daily 3)  Avelox 400 Mg Tabs (Moxifloxacin hcl) .... By mouth daily  Complete Medication List: 1)  Brovana 15 Mcg/48ml Nebu (Arformoterol tartrate) .... One in neb two times a day 2)  Qvar 80 Mcg/act Aers (Beclomethasone dipropionate) .... Three  puffs twice daily 3)  Xopenex Hfa 45 Mcg/act Aero (Levalbuterol tartrate) .... One to two puff q4h as needed 4)  Omeprazole 20 Mg Tbec (Omeprazole) .[name].. 1 by mouth daily 5)  Prednisone 10 Mg Tabs (Prednisone) .... One daily 6)  Avelox 400 Mg Tabs (Moxifloxacin hcl) .... By mouth daily  Patient Instructions: 1)  Avelox one daily for 5 days and stop 2)  Dexilant one a day until samples gone then omeprazole one daily 3)  No change in inhalers 4)  Return two months Prescriptions: OMEPRAZOLE 20 MG  TBEC (OMEPRAZOLE) 1 by mouth daily  #30 x 6   Entered and Authorized by:   Storm Frisk MD   Signed by:   Storm Frisk MD on 02/24/2009   Method used:   [facility] [address] [phone][facility] (retail)       [address]       [place], [place]  [address]       Ph: [phone] or [phone]       Fax: [phone]   RxID:   [id number]

## 2010-02-09 NOTE — Progress Notes (Signed)
Summary: refill   Phone Note Refill Request Message from:  Patient on August 15, 2009 9:02 AM  Refills Requested: Medication #1:  AMLODIPINE BESYLATE 5 MG TABS 1 once daily [facility]  Initial call taken by: Judie Grieve,  August 15, 2009 9:02 AM    Prescriptions: AMLODIPINE BESYLATE 5 MG TABS (AMLODIPINE BESYLATE) 1 once daily  #30 x 11   Entered by:   Hardin Negus, RMA   Authorized by:   Dolores Patty, MD, [facility]   Signed by:   Hardin Negus, RMA on 08/16/2009   Method used:   [facility]        [facility] [address] [phone]* (retail)       [address]       [place], [place]  [address]       Ph: [phone] or [phone]       Fax: [phone]   RxID:   [id number]

## 2010-02-09 NOTE — Assessment & Plan Note (Signed)
Summary: Pulmonary OV   CC:  Pt c/o chest tightness, SOB with exertion, leg weakness, and productive cough with small amounts white mucus. Pt d/c from hospital on prednisone taper. Pt c/o back pain radiating to left chest when sleeping.  History of Present Illness: 65 year old Asian male patient with a known history of chronic obstructive pulmonary disease with FEV1 of 63% associated CAD non obstructed with recent Non STEMI 6/11.  Hyperlipidemia and HTN  July 01, 2009 11:06 AM Pt was in hosp.  end of 5/11 first of 6/11.  Then got worse and went to [facility].  Had Nonstemi. Pt still notes tightness in chest , still is dyspneic.  Overall is better than end of 5/11.  Pt notes no wheeze.  Pt had Myoview with readmit 6/12- 6/15 that did not show ischemia.  Pt is on medical therapy.  No outpt cardiology OV yet scheduled.  Pt now admits he was smoking until 6 months ago.         Preventive Screening-Counseling & Management  Alcohol-Tobacco     Smoking Status: current     Smoking Cessation Counseling: yes     Year Quit: 2011     Tobacco Counseling: to remain off tobacco products   Current Medications (verified): 1)  Brovana 15 Mcg/57ml  Nebu (Arformoterol Tartrate) .... One in [place] Two Times A Day 2)  Qvar 80 Mcg/act  Aers (Beclomethasone Dipropionate) .... Three  Puffs Twice Daily 3)  Xopenex Hfa 45 Mcg/act  Aero (Levalbuterol Tartrate) .... One To Two Puff Q4h As Needed 4)  Omeprazole 20 Mg  Tbec (Omeprazole) .[name].. 1 By Mouth Daily 5)  Prednisone 10 Mg  Tabs (Prednisone) .... Take One By Mouth Once Daily 6)  Norvasc 5 Mg Tabs (Amlodipine Besylate) .... Take 1 Tablet By Mouth Once A Day 7)  Aspirin 81 Mg Tbec (Aspirin) .... Take 1 Tablet By Mouth Once A Day 8)  Lipitor 80 Mg Tabs (Atorvastatin Calcium) .... Take 1 Tab By Mouth At Bedtime 9)  Clonazepam 0.5 Mg Tabs (Clonazepam) .... Take 1 Tablet By Mouth Two Times A Day As Needed Anxiety 10)  Mucinex 600 Mg Xr12h-Tab (Guaifenesin) .... 2 Tabs By  Mouth Two Times A Day 11)  Hydrocodone-Acetaminophen 5-325 Mg Tabs (Hydrocodone-Acetaminophen) .[name].. 1 Tab By Mouth Every 6 Hours As Needed Pain 12)  Imdur 30 Mg Xr24h-Tab (Isosorbide Mononitrate) .... 1/2 Tab By Mouth Once Daily 13)  Spiriva Handihaler 18 Mcg Caps (Tiotropium Bromide Monohydrate) .... Inhale Contents of 1 Capsule Once A Day 14)  Nitrostat 0.4 Mg Subl (Nitroglycerin) .... As Needed 15)  Prednisone 10 Mg Tabs (Prednisone) .... Take 4 Tabs Once Daily X 3 Days, 3 Tabs X 3 Days, 2 Tabs X 3 Days, Then 1 Daily  Allergies (verified): No Known Drug Allergies  Past History:  Past medical, surgical, family and social histories (including risk factors) reviewed, and no changes noted (except as noted below).  Past Medical History: Reviewed history from 05/31/2009 and no changes required. COPD, exsmoker GERD  Family History: Reviewed history from 03/20/2007 and no changes required. non contributory  Social History: Reviewed history from 03/20/2007 and no changes required. Patient states former smoker.  Smoking Status:  current  Review of Systems       The patient complains of shortness of breath with activity, non-productive cough, and chest pain.  The patient denies shortness of breath at rest, productive cough, coughing up blood, irregular heartbeats, acid heartburn, indigestion, loss of appetite, weight change, abdominal pain, difficulty  swallowing, sore throat, tooth/dental problems, headaches, nasal congestion/difficulty breathing through nose, sneezing, itching, ear ache, anxiety, depression, hand/feet swelling, joint stiffness or pain, rash, change in color of mucus, and fever.    Vital Signs:  Patient profile:   65 year old male Height:      59 inches Weight:      131 pounds BMI:     26.55 O2 Sat:      96 % on Room air Temp:     97.7 degrees F oral Pulse rate:   85 / minute BP sitting:   110 / 70  (left arm) Cuff size:   regular  Vitals Entered By: Zackery Barefoot CMA (July 01, 2009 10:51 AM)  O2 Flow:  Room air CC: Pt c/o chest tightness, SOB with exertion, leg weakness, productive cough with small amounts white mucus. Pt d/c from hospital on prednisone taper. Pt c/o back pain radiating to left chest when sleeping Comments Medications reviewed with patient Verified contact number and pharmacy with patient Zackery Barefoot CMA  July 01, 2009 10:51 AM    Physical Exam  Additional Exam:  Gen: Pleasant, well-nourished, in no distress , normal affect ENT: no lesions, no post nasal drip Neck: No JVD, no TMG, no carotid bruits Lungs: No use of accessory muscles, no dullness to percussion, distant BS, no wheezing  Cardiovascular: RRR, heart sounds normal, no murmurs or gallops, no peripheral edema Abdomen: soft and non-tender, no HSM, BS normal Musculoskeletal: No deformities, no cyanosis or clubbing Neuro: alert, non-focal     Impression & Recommendations:  Problem # 1:  COPD (ICD-496) Assessment Unchanged Severe Copd , only recently stopped smoking . Language barrier has been an issue. Pts DPI technique has been poor. plan We spent today with [employer] going over the pts diagnosis and describing proper DPI use with the patient using an interpreter. No change in inhaled medications.   Maintain treatment program as currently prescribed. continue pred taper to 10mg /d and stay  Problem # 2:  CAD (ICD-414.00) Assessment: Unchanged Moderate obstructive coronary disease plan  continue medical therapy f/u per cardiology  His updated medication list for this problem includes:    Norvasc 5 Mg Tabs (Amlodipine besylate) .[name]... Take 1 tablet by mouth once a day    Aspirin 81 Mg Tbec (Aspirin) .[name]... Take 1 tablet by mouth once a day    Imdur 30 Mg Xr24h-tab (Isosorbide mononitrate) .[name]... 1/2 tab by mouth once daily    Nitrostat 0.4 Mg Subl (Nitroglycerin) .[name]... As needed  Orders: Est. Patient Level V (04540)  Medications  Added to Medication List This Visit: 1)  Nitrostat 0.4 Mg Subl (Nitroglycerin) .... As needed 2)  Prednisone 10 Mg Tabs (Prednisone) .... Take 4 tabs once daily x 3 days, 3 tabs x 3 days, 2 tabs x 3 days, then 1 daily  Complete Medication List: 1)  Brovana 15 Mcg/11ml Nebu (Arformoterol tartrate) .... One in neb two times a day 2)  Qvar 80 Mcg/act Aers (Beclomethasone dipropionate) .... Three  puffs twice daily 3)  Xopenex Hfa 45 Mcg/act Aero (Levalbuterol tartrate) .... One to two puff q4h as needed 4)  Omeprazole 20 Mg Tbec (Omeprazole) .[name].. 1 by mouth daily 5)  Prednisone 10 Mg Tabs (Prednisone) .... Take one by mouth once daily 6)  Norvasc 5 Mg Tabs (Amlodipine besylate) .... Take 1 tablet by mouth once a day 7)  Aspirin 81 Mg Tbec (Aspirin) .... Take 1 tablet by mouth once a day 8)  Lipitor 80 Mg Tabs (Atorvastatin calcium) .... Take 1 tab by mouth at bedtime 9)  Clonazepam 0.5 Mg Tabs (Clonazepam) .... Take 1 tablet by mouth two times a day as needed anxiety 10)  Mucinex 600 Mg Xr12h-tab (Guaifenesin) .... 2 tabs by mouth two times a day 11)  Hydrocodone-acetaminophen 5-325 Mg Tabs (Hydrocodone-acetaminophen) .[name].. 1 tab by mouth every 6 hours as needed pain 12)  Imdur 30 Mg Xr24h-tab (Isosorbide mononitrate) .... 1/2 tab by mouth once daily 13)  Spiriva Handihaler 18 Mcg Caps (Tiotropium bromide monohydrate) .... Inhale contents of 1 capsule once a day 14)  Nitrostat 0.4 Mg Subl (Nitroglycerin) .... As needed 15)  Prednisone 10 Mg Tabs (Prednisone) .... Take 4 tabs once daily x 3 days, 3 tabs x 3 days, 2 tabs x 3 days, then 1 daily  Patient Instructions: 1)  When prednisone is down to one daily , stay on prednisone  2)  No more antibiotcis 3)  Stay off cigarettes 4)  Get appointment with Dr Teressa Lower in Cardiology 5)  Stay on nebulizer and inhalers 6)  You have coronary artery disease and chronic lung disease 7)  Return two months   Prevention & Chronic Care Immunizations    Influenza vaccine: Fluvax MCR  (10/12/2008)    Tetanus booster: Not documented    Pneumococcal vaccine: Pneumovax  (11/13/2006)    H. zoster vaccine: Not documented  Colorectal Screening   Hemoccult: Not documented    Colonoscopy: Not documented  Other Screening   PSA: Not documented   Smoking status: current  (07/01/2009)   Smoking cessation counseling: yes  (07/01/2009)  Lipids   Total Cholesterol: Not documented   LDL: Not documented   LDL Direct: Not documented   HDL: Not documented   Triglycerides: Not documented

## 2010-03-02 ENCOUNTER — Encounter: Payer: Self-pay | Admitting: Critical Care Medicine

## 2010-03-16 NOTE — Letter (Signed)
Summary: Statement of Medical Necessity / [facility]  Statement of Medical Necessity / [facility]   Imported By: Lennie Odor 03/06/2010 11:53:05  _____________________________________________________________________  External Attachment:    Type:   Image     Comment:   External Document

## 2010-03-26 LAB — CBC
Hemoglobin: 12.7 g/dL — ABNORMAL LOW (ref 13.0–17.0)
MCV: 91.9 fL (ref 78.0–100.0)
Platelets: 237 10*3/uL (ref 150–400)
RBC: 4.03 MIL/uL — ABNORMAL LOW (ref 4.22–5.81)
WBC: 9.7 10*3/uL (ref 4.0–10.5)

## 2010-03-27 LAB — DIFFERENTIAL
Basophils Relative: 0 % (ref 0–1)
Eosinophils Absolute: 0.3 10*3/uL (ref 0.0–0.7)
Eosinophils Absolute: 0 10*3/uL (ref 0.0–0.7)
Eosinophils Relative: 0 % (ref 0–5)
Lymphocytes Relative: 17 % (ref 12–46)
Lymphs Abs: 0.8 10*3/uL (ref 0.7–4.0)
Monocytes Relative: 8 % (ref 3–12)
Monocytes Relative: 5 % (ref 3–12)
Neutro Abs: 8.1 10*3/uL — ABNORMAL HIGH (ref 1.7–7.7)
Neutrophils Relative %: 91 % — ABNORMAL HIGH (ref 43–77)

## 2010-03-27 LAB — COMPREHENSIVE METABOLIC PANEL
ALT: 95 U/L — ABNORMAL HIGH (ref 0–53)
AST: 37 U/L (ref 0–37)
Albumin: 3.3 g/dL — ABNORMAL LOW (ref 3.5–5.2)
Alkaline Phosphatase: 61 U/L (ref 39–117)
BUN: 15 mg/dL (ref 6–23)
BUN: 16 mg/dL (ref 6–23)
BUN: 19 mg/dL (ref 6–23)
Calcium: 8.3 mg/dL — ABNORMAL LOW (ref 8.4–10.5)
Calcium: 9.1 mg/dL (ref 8.4–10.5)
Chloride: 108 mEq/L (ref 96–112)
Creatinine, Ser: 0.65 mg/dL (ref 0.4–1.5)
Creatinine, Ser: 0.75 mg/dL (ref 0.4–1.5)
GFR calc Af Amer: >60 mL/min (ref >60)
Glucose, Bld: 87 mg/dL (ref 70–99)
Glucose, Bld: 147 mg/dL — ABNORMAL HIGH (ref 70–99)
Glucose, Bld: 88 mg/dL (ref 70–99)
Potassium: 3 mEq/L — ABNORMAL LOW (ref 3.5–5.1)
Potassium: 3.5 mEq/L (ref 3.5–5.1)
Sodium: 143 mEq/L (ref 135–145)
Total Protein: 5.6 g/dL — ABNORMAL LOW (ref 6.0–8.3)
Total Protein: 5.7 g/dL — ABNORMAL LOW (ref 6.0–8.3)
Total Protein: 6.2 g/dL (ref 6.0–8.3)

## 2010-03-27 LAB — BASIC METABOLIC PANEL
BUN: 18 mg/dL (ref 6–23)
BUN: 18 mg/dL (ref 6–23)
CO2: 28 mEq/L (ref 19–32)
CO2: 28 mEq/L (ref 19–32)
Calcium: 8.3 mg/dL — ABNORMAL LOW (ref 8.4–10.5)
Chloride: 108 mEq/L (ref 96–112)
Chloride: 104 mEq/L (ref 96–112)
Creatinine, Ser: 0.69 mg/dL (ref 0.4–1.5)
GFR calc Af Amer: >60 mL/min (ref >60)
Glucose, Bld: 112 mg/dL — ABNORMAL HIGH (ref 70–99)
Glucose, Bld: 86 mg/dL (ref 70–99)
Potassium: 3.8 mEq/L (ref 3.5–5.1)
Potassium: 3.9 mEq/L (ref 3.5–5.1)
Potassium: 4 mEq/L (ref 3.5–5.1)
Sodium: 138 mEq/L (ref 135–145)
Sodium: 140 mEq/L (ref 135–145)

## 2010-03-27 LAB — POCT CARDIAC MARKERS: CKMB, poc: 2.4 ng/mL (ref 1.0–8.0)

## 2010-03-27 LAB — TSH: TSH: 0.094 u[IU]/mL — ABNORMAL LOW (ref 0.350–4.500)

## 2010-03-27 LAB — CBC
HCT: 39.9 % (ref 39.0–52.0)
HCT: 42.8 % (ref 39.0–52.0)
HCT: 43 % (ref 39.0–52.0)
HCT: 43.6 % (ref 39.0–52.0)
Hemoglobin: 13.5 g/dL (ref 13.0–17.0)
Hemoglobin: 13.6 g/dL (ref 13.0–17.0)
Hemoglobin: 14.3 g/dL (ref 13.0–17.0)
Hemoglobin: 14.5 g/dL (ref 13.0–17.0)
Hemoglobin: 14.6 g/dL (ref 13.0–17.0)
MCHC: 33.9 g/dL (ref 30.0–36.0)
MCHC: 34 g/dL (ref 30.0–36.0)
MCV: 91.7 fL (ref 78.0–100.0)
MCV: 90.4 fL (ref 78.0–100.0)
MCV: 91.4 fL (ref 78.0–100.0)
Platelets: 232 10*3/uL (ref 150–400)
RBC: 4.34 MIL/uL (ref 4.22–5.81)
RBC: 4.57 MIL/uL (ref 4.22–5.81)
RBC: 4.77 MIL/uL (ref 4.22–5.81)
RDW: 13.9 % (ref 11.5–15.5)
RDW: 13.3 % (ref 11.5–15.5)
RDW: 13.4 % (ref 11.5–15.5)
WBC: 10 10*3/uL (ref 4.0–10.5)
WBC: 9.3 10*3/uL (ref 4.0–10.5)
WBC: 17.9 10*3/uL — ABNORMAL HIGH (ref 4.0–10.5)

## 2010-03-27 LAB — LIPID PANEL
HDL: 73 mg/dL (ref >39)
Total CHOL/HDL Ratio: 2.9 RATIO
Triglycerides: 123 mg/dL (ref <150)
VLDL: 25 mg/dL (ref 0–40)

## 2010-03-27 LAB — PROTIME-INR: INR: 0.93 (ref 0.00–1.49)

## 2010-03-27 LAB — CARDIAC PANEL(CRET KIN+CKTOT+MB+TROPI)
Relative Index: INVALID (ref 0.0–2.5)
Relative Index: INVALID (ref 0.0–2.5)
Total CK: 51 U/L (ref 7–232)
Total CK: 47 U/L (ref 7–232)
Troponin I: 0.14 ng/mL — ABNORMAL HIGH (ref 0.00–0.06)
Troponin I: 0.25 ng/mL — ABNORMAL HIGH (ref 0.00–0.06)
Troponin I: 0.02 ng/mL (ref 0.00–0.06)

## 2010-03-27 LAB — APTT: aPTT: 29 seconds (ref 24–37)

## 2010-03-27 LAB — BRAIN NATRIURETIC PEPTIDE: Pro B Natriuretic peptide (BNP): 65.1 pg/mL (ref 0.0–100.0)

## 2010-03-27 LAB — EXPECTORATED SPUTUM ASSESSMENT W GRAM STAIN, RFLX TO RESP C

## 2010-03-27 LAB — TROPONIN I: Troponin I: 0.57 ng/mL (ref 0.00–0.06)

## 2010-03-27 LAB — D-DIMER, QUANTITATIVE: D-Dimer, Quant: 0.3 ug/mL-FEU (ref 0.00–0.48)

## 2010-03-27 LAB — HEPARIN LEVEL (UNFRACTIONATED): Heparin Unfractionated: 0.33 IU/mL (ref 0.30–0.70)

## 2010-03-27 LAB — T4, FREE: Free T4: 1.44 ng/dL (ref 0.80–1.80)

## 2010-04-11 ENCOUNTER — Other Ambulatory Visit: Payer: Self-pay | Admitting: Critical Care Medicine

## 2010-05-23 NOTE — Assessment & Plan Note (Signed)
[facility]                             PULMONARY OFFICE NOTE   REYNOLDS, KITTEL                            MRN:          [id number]  DATE:11/13/2006                            DOB:          1945/06/06    DICTATION CANCELED     Charlcie Cradle. Delford Field, MD, [facility]  Electronically Signed    PEW/MedQ  DD: 11/13/2006  DT: 11/14/2006  Job #: [id number]

## 2010-05-23 NOTE — Assessment & Plan Note (Signed)
[facility]                             PULMONARY OFFICE NOTE   STEADMAN, PROSPERI                            MRN:          [id number]  DATE:11/13/2006                            DOB:          03-13-1945    Mr. Ritter returns in followup.  Has underlying chronic obstructive lung  disease.  His level of shortness of breath and cough are the same.  He  is due now for Pneumovax and flu vaccine.  Maintains Brovana b.i.d. by  nebulization, omeprazole 20 mg daily, Q-Var 2 sprays b.i.d. 40 mg  strength, prednisone 10 mg daily, Xopenex inhaler p.r.n.   PHYSICAL EXAMINATION:  VITAL SIGNS:  Temp 98, blood pressure 126/78,  pulse 73, saturation 97% on room air.  CHEST:  Showed distant breath sounds with prolonged expiratory phase.  No wheeze or rhonchi noted.  CARDIAC:  Showed regular rate and rhythm without S3, normal S1, S2.  ABDOMEN:  Soft, nontender.  EXTREMITIES:  Showed no edema or clubbing.  SKIN:  Clear.   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   PLAN:  For patient to maintain inhaled medicines as currently  prescribed.  Flu vaccine and Pneumovax were obtained.     Charlcie Cradle Delford Field, MD, [facility]  Electronically Signed    PEW/MedQ  DD: 11/13/2006  DT: 11/14/2006  Job #: [id number]

## 2010-05-26 NOTE — Assessment & Plan Note (Signed)
[facility]                             PULMONARY OFFICE NOTE   MAGNUS, CRESCENZO                            MRN:          [id number]  DATE:01/03/2006                            DOB:          [birth year]    Mr. [name] is a 65 year old Asian male who returns in followup for  evaluation of asthmatic bronchitis.  He is maintained on DuoNeb q.i.d.,  Qvar 2 sprays b.i.d. 40 mcg strength, prednisone 10 mg daily and ProAir  p.r.n.  Pulmonary-wise, our patient has been doing fairly well with no  new complaints.  He has some mucus in the morning which he is able to  expectorate and this improves his level of breathing and functionality.   EXAMINATION:  Temperature 97, blood pressure 138/90, pulse 85,  saturation 96% room air.  CHEST:  Showed distant breath sounds with no evidence of wheeze or  rhonchi.  CARDIAC:  Showed a regular rate and rhythm, without S3, normal S1, S2.  ABDOMEN:  Soft, nontender.  EXTREMITIES:  Showed no edema or clubbing.  SKIN:  Clear.  NEUROLOGIC:  Intact.  HEENT:  Showed no jugular venous distention or lymphadenopathy,  oropharynx clear.  NECK:  Supple.   IMPRESSION:  That of asthmatic bronchitis with stable airway  functioning, chronic mucociliary dysfunction.   PLAN:  Maintain prednisone 10 mg daily, Qvar 40 mcg two sprays b.i.d.,  ProAir p.r.n., and DuoNeb q.i.d.  The patient will return in followup in  4 months.     Charlcie Cradle Delford Field, MD, [facility]  Electronically Signed    PEW/MedQ  DD: 01/03/2006  DT: 01/03/2006  Job #: [id number]

## 2010-05-26 NOTE — Assessment & Plan Note (Signed)
[facility]                             PULMONARY OFFICE NOTE   [name], [name]                            MRN:          [id number]  DATE:05/16/2006                            DOB:          1945/03/06    Mr. [name] is a 65 year old Asian male who is here today with increased  dyspnea, worse with hot temperatures. The patient is dyspneic with  minimal exertion and has occasional dry cough. This patient maintains  Qvar two sprays b.i.d. 40 mcg strength, omeprazole 20 mg daily,  prednisone 10 mg daily, nebulized albuterol and Atrovent q.i.d.   PHYSICAL EXAMINATION:  Temperature 97.5, blood pressure 130/84, pulse  80, saturation is 96% on room air.  CHEST: Showed distant breath sounds. No evidence of wheeze or rhonchi.  CARDIAC: Showed a regular rate and rhythm without S3. Normal S1, S2.  EXTREMITIES: Showed no edema or clubbing.   IMPRESSION:  Is that of chronic obstructive lung disease, asthmatic  bronchitic component.   PLAN:  For this patient is to maintain inhaled medicines as currently  dosed. However, we will switch him from the albuterol and Atrovent  compound to Spiriva once daily and Brovana b.i.d. by nebulization. He  will maintain prednisone as is and Qvar as is and will see the patient  back in return followup in one month.     [name] Cradle Delford Field, MD, [facility]  Electronically Signed    PEW/MedQ  DD: 05/16/2006  DT: 05/16/2006  Job #: [id number]
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
## 2010-05-26 NOTE — Assessment & Plan Note (Signed)
[facility]                               PULMONARY OFFICE NOTE   [name], [name]                            MRN:          [id number]  DATE:11/27/2005                            DOB:          [birth year]    HISTORY OF PRESENT ILLNESS:  This is a 65 year old Asian male, patient of  Dr. Delford Field, with a history of asthmatic bronchitis who presents for a  routine followup.  The patient has not been seen in greater than a year.  He  presents today complaining that, over the last week, he has had some  intermittent wheezing and dry cough.  The patient does report he has run out  of his medications over the last week.  He is maintained on Qvar 40 two  puffs twice daily, prednisone 10 mg daily, DuoNeb nebulizer 4 times a day,  and p.r.n. ProAir.  He reports he has been doing well up until this last  week.  The patient denies any purulent sputum, fever, chest pain, orthopnea,  PND.  The patient has had to use ProAir over the last week several times a  day.   PAST MEDICAL HISTORY:  Reviewed.   CURRENT MEDICATIONS:  Reviewed.   PHYSICAL EXAMINATION:  GENERAL: The patient is a pleasant male in no acute  distress.  VITAL SIGNS:  He is afebrile with normal vital signs.  O2 saturation is 97%  on room air.  HEENT: Unremarkable.  NECK:  Supple without adenopathy.  LUNGS: Sounds reveal some expiratory wheezes bilaterally.  CARDIAC: Regular rate and rhythm.  ABDOMEN:  Soft.  EXTREMITIES:  Warm without any edema.   IMPRESSION AND PLAN:  Asthmatic bronchitis with exacerbation off of his  maintenance regimen.  The patient was given a Xopenex nebulizer treatment in  the office.  He is to restart Qvar 40 two puffs twice daily along with his  DuoNeb nebulizer 4 times a day.  Will continue on prednisone; however,  increase up to 20 x5 days and then resume back to 10 mg daily dosing.  The  patient is advised that he needs to keep up his routine office visits, and  he  has been scheduled to see Dr. Delford Field in 2-3 weeks or sooner if needed.     Rubye Oaks, NP  Electronically Signed      Charlcie Cradle Delford Field, MD, [facility]  Electronically Signed   TP/MedQ  DD: 11/27/2005  DT: 11/27/2005  Job #: [id number]

## 2010-05-26 NOTE — Assessment & Plan Note (Signed)
[facility]                             PULMONARY OFFICE NOTE   [name], [name]                            MRN:          [id number]  DATE:03/26/2006                            DOB:          1945/06/04    [name] returns today in followup, 65 year old [place] male.  History  of shortness of breath, asthmatic bronchitis over all improved.  Maintains prednisone 10 mg daily, Qvar 2 sprays b.i.d. 40 mcg strength,  omeprazole 20 mg daily, nebulized albuterol and Atrovent q.i.d.   EXAMINATION:  Temperature 97.9, blood pressure 130/80, pulse 86,  saturation 97% room air.  CHEST:  Showed distant breath sounds without evidence of wheeze or  rhonchi.  CARDIAC:  Showed a regular rate and rhythm without S3, normal S1, S2.  ABDOMEN:  Soft, nontender.  Extremities showed no edema, clubbing, or  venous disease.   IMPRESSION:  That of chronic obstructive pulmonary disease, asthmatic  bronchitic component.   PLAN:  The patient to maintain inhaled medications as currently dosed  and will see the patient back in return followup.     Charlcie Cradle Delford Field, MD, [facility]  Electronically Signed    PEW/MedQ  DD: 03/26/2006  DT: 03/27/2006  Job #: [id number]

## 2010-05-26 NOTE — H&P (Signed)
NAME:  [name], [name]                               ACCOUNT NO.:  [id number]   MEDICAL RECORD NO.:  [id number]                   PATIENT TYPE:  INP   LOCATION:  0356                                 FACILITY:  [facility]   PHYSICIAN:  [name], M.D. [facility]            DATE OF BIRTH:  Feb 12, 1945   DATE OF ADMISSION:  01/12/2003  DATE OF DISCHARGE:                                HISTORY & PHYSICAL   HISTORY OF PRESENT ILLNESS:  Mr. Dal is a 65 year old Asian male, history  of chronic obstructive lung disease, asthmatic bronchitis, comes to the  office today with a week and one-half history of increasing shortness of  breath, increasing chest congestion, increased respiratory distress.  He has  been using his nebulizer on an every hourly basis up to 8 times a day.  He  is admitted now for further inpatient care with failure to respond to  outpatient treatment.   PAST MEDICAL HISTORY:  History of reflux disease although had a negative  upper endoscopy.  No other major medical illnesses.  History of asthmatic  bronchitis.   PAST SURGICAL HISTORY:  None.   ALLERGIES:  None.   SOCIAL HISTORY:  The patient is an ex-smoker.  He is disabled.   FAMILY HISTORY:  Noncontributory and unaware of his family history because  he is from [place] and did not have much of awareness of his family.   His medical history and review of systems noncontributory.   PHYSICAL EXAMINATION:  GENERAL:  This is a thin Asian male in no acute  distress.  VITAL SIGNS:  Temp 97, blood pressure 110/70, pulse 91.  Saturation 95% room  air.  HEENT:  No jugular venous distention, or lymphadenopathy.  Oropharynx clear.  NECK:  Supple.  CHEST:  Inspiratory and expiratory wheeze with poor air movement.  CARDIAC:  Regular rate and rhythm, S3, normal S1 & S2.  ABDOMEN:  Protuberant.  EXTREMITIES:  No edema or clubbing.  NEUROLOGICAL:  Intact.   IMPRESSION:  Impression is that of asthmatic bronchitic flair with chronic  obstructive pulmonary disease exacerbation.   PLAN:  The plan is for the patient to be admitted to a regular room, given  intensive neb treatments, IV Solu-Medrol, IV Avelox, check sputum cultures,  pulmonary functions.  We will see the patient and follow up in the hospital  setting.                                               [name], M.D. [facility]    PW/MEDQ  D:  01/12/2003  T:  01/12/2003  Job:  [id number]

## 2010-05-26 NOTE — Discharge Summary (Signed)
NAMEERASTUS, [name]                               ACCOUNT NO.:  [id number]   MEDICAL RECORD NO.:  [id number]                   PATIENT TYPE:  INP   LOCATION:  3022                                 FACILITY:  [facility]   PHYSICIAN:  [name], M.D. [facility]            DATE OF BIRTH:  1945/11/26   DATE OF ADMISSION:  11/04/2002  DATE OF DISCHARGE:                                 DISCHARGE SUMMARY   DISCHARGE DIAGNOSES:  1. Acute exacerbation of asthmatic bronchitis with chronic obstructive     pulmonary disease flare with continued tobacco abuse.  2. Gastroesophageal reflux disease.  3. Active tobacco abuse.   HISTORY OF PRESENT ILLNESS:  Mr. [name] is a 65 year old [place] male is  admitted for asthmatic bronchitis exacerbation with a previous history of  chronic obstructive pulmonary disease and continued tobacco abuse.  He was  seen in the office and given a Depo-Medrol injection and nebulizer treatment  but within four hours required admission to the emergency department and  subsequent admission to the hospital in the acute setting.   LABORATORY DATA:  Respiratory culture is pending.  Arterial blood gas on 2  liter nasal cannula:  pH 7.34, PCO2 45, PO2 of 88, with bicarb of 24.  Wbc  is 8.5, hemoglobin 13.5, hematocrit 39.6, platelets are 261.  Sodium 140,  potassium 3.4, chloride is 109, CO2 is 25, glucose 199, BUN is 8, creatinine  0.8, calcium is 8.5.  Chest x-ray shows the questionable involvement of COPD  with no active disease.   HOSPITAL COURSE BY DISCHARGE DIAGNOSIS:  #1 - ASTHMATIC BRONCHITIC FLARE.  Mr. [name] was admitted to [facility] and treated with IV steroids  and IV antibiotics along with nebulized bronchodilators.  He reached maximum  hospital benefit by November 06, 2002 and was ready for discharge home.  Of  note, he has been placed on triple combination nebulizers of albuterol,  Atrovent, and Pulmicort.  He also has been placed on Spiriva.   #2 -  CONTINUED TOBACCO ABUSE.  He was placed on nicotine patch at 21 mg x6  weeks, 14 mg x6 weeks, then 7 mg x2 weeks, then discontinue.  Of note, he is  being treated with prednisone on a taper.   #3 - GASTROESOPHAGEAL REFLUX DISEASE.  He remained on proton pump inhibitor.   DISCHARGE MEDICATIONS:  1. Prednisone 40 mg a day for four days, 30 mg a day for four days, 20 mg a     day for four days, 10 mg a day for four days, and then stop.  2. Nicotine patches 21 mg x6 weeks, 14 mg x2 weeks, 7 mg x2 weeks, and then     stop.  3. Protonix 40 mg or Prevacid 30 mg daily.  4. Spiriva one capsule daily.  5. Elavil 20 mg daily.  6. Pulmicort 0.25, Atrovent 0.5, and albuterol 2.5 hand-held nebulizer  four     times a day.   DIET:  No restriction.   He has a follow-up appointment with nurse practitioner Minor on November 12, 2002 at 1:30 p.m., with Dr. Delford Field on December 07, 2002 at 9:15 a.m.   DISPOSITION/CONDITION ON DISCHARGE:  His acute exacerbation of asthmatic  bronchitis has improved.  He is being discharged in improved condition.      Brett Canales Minor, A.C.N.P. [facility]                 [name], M.D. [facility]    SM/MEDQ  D:  11/06/2002  T:  11/06/2002  Job:  [id number]

## 2010-05-26 NOTE — H&P (Signed)
NAMEMEGHAN, [name]                               ACCOUNT NO.:  [id number]   MEDICAL RECORD NO.:  [id number]                   PATIENT TYPE:  INP   LOCATION:  1826                                 FACILITY:  [facility]   PHYSICIAN:  [name], M.D. [facility]            DATE OF BIRTH:  1945/08/25   DATE OF ADMISSION:  11/04/2002  DATE OF DISCHARGE:                                HISTORY & PHYSICAL   CHIEF COMPLAINT:  Respiratory distress.   HISTORY OF PRESENT ILLNESS:  This is a 65 year old Asian male admitted with  asthmatic bronchitic exacerbation, previous history of COPD. He was seen in  the office this morning, given a Depo Medrol injection, and nebulizer  treatments. He got worse as the afternoon progressed and was brought back in  my EMS to the emergency department  where upon he was admitted for further  inpatient care.   PAST MEDICAL HISTORY:  Medical history of COPD only, asthmatic bronchitis.  He still actively smokes a pack a day. Operations are none.  No other major  medical illnesses.   ALLERGIES:  None.   SOCIAL HISTORY:  The patient works at [employer].  He  continues to smoke.   FAMILY HISTORY:  Noncontributory. He moved here from [place].   REVIEW OF SYSTEMS:  Noncontributory.   CURRENT MEDICATIONS:  Albuterol, Atrovent, and Pulmicort by nebulization  q.i.d.  Prevacid has been stopped recently.  He has been out of these  nebulizer treatments for about a month. He has had no medications except for  a p.r.n. albuterol inhaler at home.   PHYSICAL EXAMINATION:  VITAL SIGNS: Temperature 98, pulse 100, respirations  28, blood pressure 167/87.  GENERAL: The patient is in some distress when brought in by EMS; clearing  now after several nebulizer treatments.  CHEST: Shows poor air movement. Prolonged expiratory phase.  CARDIAC: Regular rate and rhythm.  No S3 or S4.  Normal S1 and S2.  ABDOMEN: Soft and nontender.  EXTREMITIES: No edema or clubbing.  NEUROLOGIC: Intact.  HEENT: No jugular venous distention or lymphadenopathy.  Oropharynx is  clear.  NECK: Supple.   LABORATORY DATA:  Chest x-ray is pending at the time of this dictation.  On  two liters, pH is 7.34, PCO2 45, and PO2 88.  White count 8.5, hemoglobin  13.5, platelet count 261,000. Sodium 140, potassium 3.4, chloride 109, CO2  25, BUN 8, creatinine 0.8, blood sugar 199. Liver function tests  unremarkable.   IMPRESSION:  Asthmatic bronchitic exacerbation with associated ongoing  smoking with flair.    RECOMMENDATIONS:  Give intensive nebulizer treatments, give IV Solu-Medrol,  give IV Avelox, obtain sputum C&S and gram stain, and followup chest x-ray.  [name], M.D. [facility]    PW/MEDQ  D:  11/04/2002  T:  11/04/2002  Job:  [id number]

## 2010-05-31 ENCOUNTER — Other Ambulatory Visit: Payer: Self-pay | Admitting: Critical Care Medicine

## 2010-07-14 ENCOUNTER — Encounter: Payer: Self-pay | Admitting: Internal Medicine

## 2010-07-25 ENCOUNTER — Other Ambulatory Visit: Payer: Self-pay | Admitting: Critical Care Medicine

## 2010-08-29 ENCOUNTER — Other Ambulatory Visit: Payer: Self-pay | Admitting: Critical Care Medicine

## 2010-08-29 NOTE — Telephone Encounter (Signed)
Pt last seen 08/31/2009 by PW.  No pending appts. Called, spoke with pt.  Scheduled a yearly follow up on Sep 05, 2010 at 10:15am -- pt aware and aware qvar rx will be sent to pharmacy in the meantime.

## 2010-09-05 ENCOUNTER — Ambulatory Visit (INDEPENDENT_AMBULATORY_CARE_PROVIDER_SITE_OTHER)
Admission: RE | Admit: 2010-09-05 | Discharge: 2010-09-05 | Disposition: A | Discharge status: Home / Self Care | Payer: Medicare Other | Source: Ambulatory Visit | Attending: Critical Care Medicine | Admitting: Critical Care Medicine

## 2010-09-05 ENCOUNTER — Ambulatory Visit (INDEPENDENT_AMBULATORY_CARE_PROVIDER_SITE_OTHER): Payer: Medicare Other | Admitting: Critical Care Medicine

## 2010-09-05 ENCOUNTER — Encounter: Payer: Self-pay | Admitting: Critical Care Medicine

## 2010-09-05 VITALS — BP 106/70 | HR 75 | Temp 97.9°F | Ht 60.0 in | Wt 128.4 lb

## 2010-09-05 DIAGNOSIS — J449 Chronic obstructive pulmonary disease, unspecified: Secondary | ICD-10-CM

## 2010-09-05 MED ORDER — PREDNISONE 10 MG PO TABS: ORAL_TABLET | ORAL | Status: DC

## 2010-09-05 NOTE — Progress Notes (Signed)
Subjective:    Patient ID: [name], male    DOB: September 02, 1945, 65 y.o.   MRN: [id number]  HPI  65 y.o.   Asian male patient with a known history of chronic obstructive pulmonary disease with FEV1 of 63% associated CAD non obstructed with recent Non STEMI 6/11. Hyperlipidemia and HTN  August 31, 2009 2:27 PM  Now new issues. Less dyspneic. Less cough. Less mucus and no chest pain since on new cardiac meds. The pt is no longer smoking. Pt denies any significant sore throat, nasal congestion or excess secretions, fever, chills, sweats, unintended weight loss, pleurtic or exertional chest pain, orthopnea PND, or leg swelling  Pt denies any increase in rescue therapy over baseline, denies waking up needing it or having any early am or nocturnal exacerbations of coughing/wheezing/or dyspnea.   09/05/2010 If works hard is dyspneic, if takes time is ok.  Cough is present but is minimal.  If eat poorly will cough with GERD symptoms.   No real wheeze. Notes some chest pain.  No new cardiac issues. Pt denies any significant sore throat, nasal congestion or excess secretions, fever, chills, sweats, unintended weight loss, pleurtic or exertional chest pain, orthopnea PND, or leg swelling Pt denies any increase in rescue therapy over baseline, denies waking up needing it or having any early am or nocturnal exacerbations of coughing/wheezing/or dyspnea. Pt also denies any obvious fluctuation in symptoms with  weather or environmental change or other alleviating or aggravating factors  Past Medical History  Diagnosis Date  . COPD (chronic obstructive pulmonary disease)     former smoker  . GERD (gastroesophageal reflux disease)   . NSTEMI (non-ST elevated myocardial infarction) 6/11    thought due to vasospasm . cath - CAD LAD 40-50%, LCX 40%, RCA 25% EF 65%. lexiscan myoview EF 68% w/o evidence of ischemia or infarct  . HTN (hypertension)      History reviewed. No pertinent family history.   History     Social History  . Marital Status: Married    Spouse Name: N/A    Number of Children: N/A  . Years of Education: N/A   Occupational History  . Not on file.   Social History Main Topics  . Smoking status: Former Smoker -- 50 years    Types: Cigarettes    Quit date: 01/09/2008  . Smokeless tobacco: Never Used   Comment: rolled own cigarettes since age of 26,   . Alcohol Use: Not on file     occassional   . Drug Use: Not on file  . Sexually Active: Not on file   Other Topics Concern  . Not on file   Social History Narrative   Married, 4 children.Disability secondary to lung disease.      No Known Allergies   Outpatient Prescriptions Prior to Visit  Medication Sig Dispense Refill  . amLODipine (NORVASC) 5 MG tablet Take 5 mg by mouth daily.        [name] arformoterol (BROVANA) 15 MCG/2ML NEBU Take 15 mcg by nebulization 2 (two) times daily.        [name] aspirin 81 MG tablet Take 162 mg by mouth daily.       . isosorbide mononitrate (IMDUR) 30 MG 24 hr tablet Take 15 mg by mouth daily.        . nitroGLYCERIN (NITROSTAT) 0.4 MG SL tablet Place 0.4 mg under the tongue every 5 (five) minutes as needed.        [name] omeprazole (PRILOSEC) 20  MG capsule Take 20 mg by mouth daily.        . potassium chloride SA (K-DUR,KLOR-CON) 20 MEQ tablet Take 20 mEq by mouth daily.        [name] SPIRIVA HANDIHALER 18 MCG inhalation capsule INHALE 1 CAPSULE ONCE DAILY  30 each  6  . XOPENEX HFA 45 MCG/ACT inhaler USE 1-2 PUFFS EVERY 4 HOURS AS NEEDED  1 Inhaler  6  . predniSONE (DELTASONE) 10 MG tablet Take one tablet by mouth once daily  30 tablet  2  . QVAR 80 MCG/ACT inhaler THREE PUFFS TWICE DAILY  8.7 g  0  . aspirin-acetaminophen-caffeine (MIGRAINE RELIEF) 250-250-65 MG per tablet Take 1 tablet by mouth every 6 (six) hours as needed.        [name] atorvastatin (LIPITOR) 80 MG tablet Take 80 mg by mouth daily.            Review of Systems Constitutional:   No  weight loss, night sweats,  Fevers, chills,  fatigue, lassitude. HEENT:   No headaches,  Difficulty swallowing,  Tooth/dental problems,  Sore throat,                No sneezing, itching, ear ache, nasal congestion, post nasal drip,   CV:  No chest pain,  Orthopnea, PND, swelling in lower extremities, anasarca, dizziness, palpitations  GI  No heartburn, indigestion, abdominal pain, nausea, vomiting, diarrhea, change in bowel habits, loss of appetite  Resp: Notes shortness of breath with exertion not  at rest.  No excess mucus, no productive cough,  No non-productive cough,  No coughing up of blood.  No change in color of mucus.  No wheezing.  No chest wall deformity  Skin: no rash or lesions.  GU: no dysuria, change in color of urine, no urgency or frequency.  No flank pain.  MS:  No joint pain or swelling.  No decreased range of motion.  No back pain.  Psych:  No change in mood or affect. No depression or anxiety.  No memory loss.     Objective:   Physical Exam  Filed Vitals:   09/05/10 1103  BP: 106/70  Pulse: 75  Temp: 97.9 F (36.6 C)  TempSrc: Oral  Height: 5' (1.524 m)  Weight: 128 lb 6.4 oz (58.242 kg)  SpO2: 97%    Gen: Pleasant, well-nourished, in no distress,  normal affect  ENT: No lesions,  mouth clear,  oropharynx clear, no postnasal drip  Neck: No JVD, no TMG, no carotid bruits  Lungs: No use of accessory muscles, no dullness to percussion, distant BS  Cardiovascular: RRR, heart sounds normal, no murmur or gallops, no peripheral edema  Abdomen: soft and NT, no HSM,  BS normal  Musculoskeletal: No deformities, no cyanosis or clubbing  Neuro: alert, non focal  Skin: Warm, no lesions or rashes   8/28  CXR IMPRESSION: There is no evidence of acute cardiac or pulmonary process.       Assessment & Plan:

## 2010-09-05 NOTE — Progress Notes (Signed)
Quick Note:  Called, spoke with pt. He is aware xray stable, no pna per PW. He is aware to no change in meds and to cont current meds as prescribed at OV today. He verbalized understanding of this. ______

## 2010-09-05 NOTE — Patient Instructions (Signed)
Prednisone 10mg  Take 4 for three days, 3 for three days, 2 for three days, then one a day and stay Chest xray today No other changes Return 2 months

## 2010-09-05 NOTE — Progress Notes (Signed)
Quick Note:  Notify the patient that the Xray is stable and no pneumonia No change in medications are recommended. Continue current meds as prescribed at last office visit ______ 

## 2010-09-21 ENCOUNTER — Other Ambulatory Visit: Payer: Self-pay | Admitting: Critical Care Medicine

## 2010-09-22 NOTE — Telephone Encounter (Signed)
This medication has been filled in the past for 3 puffs twice daily which is what the refill request is for.  However, pt's current med list is for 2 puffs twice daily. I spoke with PW regarding this -  Pt should be on 3 puffs bid so rx was sent to pharmacy to reflect this.

## 2010-10-04 ENCOUNTER — Other Ambulatory Visit: Payer: Self-pay | Admitting: Internal Medicine

## 2010-11-08 ENCOUNTER — Encounter: Payer: Self-pay | Admitting: Critical Care Medicine

## 2010-11-08 ENCOUNTER — Ambulatory Visit (INDEPENDENT_AMBULATORY_CARE_PROVIDER_SITE_OTHER): Payer: Medicare Other | Admitting: Critical Care Medicine

## 2010-11-08 VITALS — BP 128/78 | HR 92 | Temp 98.3°F | Ht 60.0 in | Wt 132.4 lb

## 2010-11-08 DIAGNOSIS — I2510 Atherosclerotic heart disease of native coronary artery without angina pectoris: Secondary | ICD-10-CM

## 2010-11-08 DIAGNOSIS — Z23 Encounter for immunization: Secondary | ICD-10-CM

## 2010-11-08 DIAGNOSIS — J449 Chronic obstructive pulmonary disease, unspecified: Secondary | ICD-10-CM

## 2010-11-08 NOTE — Progress Notes (Signed)
Subjective:    Patient ID: [name], male    DOB: 06-19-1945, 65 y.o.   MRN: [id number]  HPI   65 y.o.   Asian male patient with a known history of chronic obstructive pulmonary disease with FEV1 of 63% associated CAD non obstructed with recent Non STEMI 6/11. Hyperlipidemia and HTN   11/08/2010 Still with AM cough.  Mucus is white.  Notes occ chest discomfort Pt denies any significant sore throat, nasal congestion or excess secretions, fever, chills, sweats, unintended weight loss, pleurtic or exertional chest pain, orthopnea PND, or leg swelling Pt denies any increase in rescue therapy over baseline, denies waking up needing it or having any early am or nocturnal exacerbations of coughing/wheezing/or dyspnea. Pt also denies any obvious fluctuation in symptoms with  weather or environmental change or other alleviating or aggravating factors    Past Medical History  Diagnosis Date  . COPD (chronic obstructive pulmonary disease)     former smoker  . GERD (gastroesophageal reflux disease)   . NSTEMI (non-ST elevated myocardial infarction) 6/11    thought due to vasospasm . cath - CAD LAD 40-50%, LCX 40%, RCA 25% EF 65%. lexiscan myoview EF 68% w/o evidence of ischemia or infarct  . HTN (hypertension)      No family history on file.   History   Social History  . Marital Status: Married    Spouse Name: N/A    Number of Children: N/A  . Years of Education: N/A   Occupational History  . Not on file.   Social History Main Topics  . Smoking status: Former Smoker -- 50 years    Types: Cigarettes    Quit date: 01/09/2008  . Smokeless tobacco: Never Used   Comment: rolled own cigarettes since age of 65,   . Alcohol Use: Not on file     occassional   . Drug Use: Not on file  . Sexually Active: Not on file   Other Topics Concern  . Not on file   Social History Narrative   Married, 4 children.Disability secondary to lung disease.      No Known Allergies   Outpatient  Prescriptions Prior to Visit  Medication Sig Dispense Refill  . amLODipine (NORVASC) 5 MG tablet Take 5 mg by mouth daily.        [name] arformoterol (BROVANA) 15 MCG/2ML NEBU Take 15 mcg by nebulization 2 (two) times daily.        [name] aspirin 81 MG tablet Take 162 mg by mouth daily.       . beclomethasone (QVAR) 80 MCG/ACT inhaler 2 puffs 2 (two) times daily.       . isosorbide mononitrate (IMDUR) 30 MG 24 hr tablet Take 15 mg by mouth daily.        [name] KLOR-CON M20 20 MEQ tablet TAKE ONE TABLET BY MOUTH DAILY  30 tablet  5  . omeprazole (PRILOSEC) 20 MG capsule Take 20 mg by mouth daily.        [name] SPIRIVA HANDIHALER 18 MCG inhalation capsule INHALE 1 CAPSULE ONCE DAILY  30 each  6  . XOPENEX HFA 45 MCG/ACT inhaler USE 1-2 PUFFS EVERY 4 HOURS AS NEEDED  1 Inhaler  6  . predniSONE (DELTASONE) 10 MG tablet Take 4 for three days, 3 for three days, 2 for three days then one daily and stay  100 tablet  6  . nitroGLYCERIN (NITROSTAT) 0.4 MG SL tablet Place 0.4 mg under the tongue every 5 (five) minutes  as needed.        [name] QVAR 80 MCG/ACT inhaler USE THREE PUFFS TWICE DAILY  8.7 g  6      Review of Systems  Constitutional:   No  weight loss, night sweats,  Fevers, chills, fatigue, lassitude. HEENT:   No headaches,  Difficulty swallowing,  Tooth/dental problems,  Sore throat,                No sneezing, itching, ear ache, nasal congestion, post nasal drip,   CV:  No chest pain,  Orthopnea, PND, swelling in lower extremities, anasarca, dizziness, palpitations  GI  No heartburn, indigestion, abdominal pain, nausea, vomiting, diarrhea, change in bowel habits, loss of appetite  Resp: Notes shortness of breath with exertion not  at rest.  No excess mucus, no productive cough,  No non-productive cough,  No coughing up of blood.  No change in color of mucus.  No wheezing.  No chest wall deformity  Skin: no rash or lesions.  GU: no dysuria, change in color of urine, no urgency or frequency.  No flank  pain.  MS:  No joint pain or swelling.  No decreased range of motion.  No back pain.  Psych:  No change in mood or affect. No depression or anxiety.  No memory loss.     Objective:   Physical Exam   Filed Vitals:   11/08/10 1624  BP: 128/78  Pulse: 92  Temp: 98.3 F (36.8 C)  TempSrc: Oral  Height: 5' (1.524 m)  Weight: 132 lb 6.4 oz (60.056 kg)  SpO2: 98%    Gen: Pleasant, well-nourished, in no distress,  normal affect  ENT: No lesions,  mouth clear,  oropharynx clear, no postnasal drip  Neck: No JVD, no TMG, no carotid bruits  Lungs: No use of accessory muscles, no dullness to percussion, distant BS  Cardiovascular: RRR, heart sounds normal, no murmur or gallops, no peripheral edema  Abdomen: soft and NT, no HSM,  BS normal  Musculoskeletal: No deformities, no cyanosis or clubbing  Neuro: alert, non focal  Skin: Warm, no lesions or rashes   8/28  CXR IMPRESSION: There is no evidence of acute cardiac or pulmonary process.       Assessment & Plan:   COPD Stable Copd Plan No change in therapy Flu vaccine    Updated Medication List Outpatient Encounter Prescriptions as of 11/08/2010  Medication Sig Dispense Refill  . amLODipine (NORVASC) 5 MG tablet Take 5 mg by mouth daily.        [name] arformoterol (BROVANA) 15 MCG/2ML NEBU Take 15 mcg by nebulization 2 (two) times daily.        [name] aspirin 81 MG tablet Take 162 mg by mouth daily.       . beclomethasone (QVAR) 80 MCG/ACT inhaler 2 puffs 2 (two) times daily.       . isosorbide mononitrate (IMDUR) 30 MG 24 hr tablet Take 15 mg by mouth daily.        [name] KLOR-CON M20 20 MEQ tablet TAKE ONE TABLET BY MOUTH DAILY  30 tablet  5  . omeprazole (PRILOSEC) 20 MG capsule Take 20 mg by mouth daily.        . predniSONE (DELTASONE) 10 MG tablet Take 10 mg by mouth daily.        [name] SPIRIVA HANDIHALER 18 MCG inhalation capsule INHALE 1 CAPSULE ONCE DAILY  30 each  6  . XOPENEX HFA 45 MCG/ACT inhaler USE 1-2 PUFFS EVERY 4  HOURS AS NEEDED  1 Inhaler  6  . DISCONTD: predniSONE (DELTASONE) 10 MG tablet Take 4 for three days, 3 for three days, 2 for three days then one daily and stay  100 tablet  6  . nitroGLYCERIN (NITROSTAT) 0.4 MG SL tablet Place 0.4 mg under the tongue every 5 (five) minutes as needed.        [name] DISCONTD: QVAR 80 MCG/ACT inhaler USE THREE PUFFS TWICE DAILY  8.7 g  6

## 2010-11-08 NOTE — Assessment & Plan Note (Addendum)
Stable Copd Plan No change in therapy Flu vaccine

## 2010-11-08 NOTE — Patient Instructions (Signed)
No change in medications. Return in         3 months Flu vaccine today A referral to Cardiology will be made

## 2010-11-17 ENCOUNTER — Ambulatory Visit: Payer: Medicare Other | Admitting: Cardiology

## 2010-12-01 ENCOUNTER — Other Ambulatory Visit: Payer: Self-pay | Admitting: Critical Care Medicine

## 2010-12-13 ENCOUNTER — Telehealth: Payer: Self-pay | Admitting: Critical Care Medicine

## 2010-12-13 ENCOUNTER — Telehealth: Payer: Self-pay | Admitting: Cardiology

## 2010-12-13 NOTE — Telephone Encounter (Signed)
Called number left to speak with patient.  A child answered the phone and stated he is not there and to call back later, after speaking with someone in a different language.  Instructed child to have pt report to ED if he is having chest pain and to call back to discuss what is going on with him.  She stated understanding.

## 2010-12-13 NOTE — Telephone Encounter (Signed)
noted 

## 2010-12-13 NOTE — Telephone Encounter (Signed)
Pt c/o increased sob and chest pain off and on x 3 weeks. He says he called and spoke with cardiology and they could not get him an appt. Pt says he has been congested and has prod cough with white mucus at times. Pt will see Dr. Craige Cotta on Thurs., 12/6 @ 2:15pm for acute visit. Pt aware to seek emergency help if his sob or chest pain gets worse. Pt verbalized understanding. Pt also given address for the office and phone number.

## 2010-12-13 NOTE — Telephone Encounter (Signed)
New problem:  C/O Chest pain.

## 2010-12-14 ENCOUNTER — Ambulatory Visit (INDEPENDENT_AMBULATORY_CARE_PROVIDER_SITE_OTHER): Payer: Medicare Other | Admitting: Pulmonary Disease

## 2010-12-14 ENCOUNTER — Ambulatory Visit (INDEPENDENT_AMBULATORY_CARE_PROVIDER_SITE_OTHER)
Admission: RE | Admit: 2010-12-14 | Discharge: 2010-12-14 | Disposition: A | Discharge status: Home / Self Care | Payer: Medicare Other | Source: Ambulatory Visit | Attending: Pulmonary Disease | Admitting: Pulmonary Disease

## 2010-12-14 ENCOUNTER — Encounter: Payer: Self-pay | Admitting: Pulmonary Disease

## 2010-12-14 VITALS — BP 120/76 | HR 87 | Temp 98.5°F | Ht 60.0 in | Wt 132.2 lb

## 2010-12-14 DIAGNOSIS — R0789 Other chest pain: Secondary | ICD-10-CM

## 2010-12-14 DIAGNOSIS — K219 Gastro-esophageal reflux disease without esophagitis: Secondary | ICD-10-CM

## 2010-12-14 DIAGNOSIS — J449 Chronic obstructive pulmonary disease, unspecified: Secondary | ICD-10-CM

## 2010-12-14 MED ORDER — CEFUROXIME AXETIL 500 MG PO TABS: 500.0000 mg | ORAL_TABLET | Freq: Two times a day (BID) | ORAL | Status: AC

## 2010-12-14 MED ORDER — PREDNISONE 10 MG PO TABS: ORAL_TABLET | ORAL | Status: DC

## 2010-12-14 NOTE — Progress Notes (Signed)
Chief Complaint  Patient presents with  . Acute Visit    Pt c/o chest burning/hurts after everything he eats, feels dizzy, increase SOB, cough w/ white phlem, chest tightness x 3 weeks    History of Present Illness: [name] is a 65 y.o. male with COPD, CAD, GERD  He is followed by Dr. Delford Field.  He is here for an acute visit.  He has noticed discomfort in his chest for 2 to 3 weeks.  He gets a burning sensation across his chest.  This gets worse when he eats or drinks, and he sometimes feels like food gets stuck.  He has a cough, but not much sputum.  He does have some wheezing.  He has been feeling feverish, but not sure if he actually had a temperature.  His sinuses have been okay.  He denies sore throat or palpitations.  He feels that his inhalers help his symptoms some.  He has not been able to take many of his pills because of stomach discomfort.  He is still using his inhalers.  He has not been using prednisone.  Past Medical History  Diagnosis Date  . COPD (chronic obstructive pulmonary disease)     former smoker  . GERD (gastroesophageal reflux disease)   . NSTEMI (non-ST elevated myocardial infarction) 6/11    thought due to vasospasm . cath - CAD LAD 40-50%, LCX 40%, RCA 25% EF 65%. lexiscan myoview EF 68% w/o evidence of ischemia or infarct  . HTN (hypertension)     Past Surgical History  Procedure Date  . Left foot surgery     repair - and ankle     Current Outpatient Prescriptions on File Prior to Visit  Medication Sig Dispense Refill  . amLODipine (NORVASC) 5 MG tablet Take 5 mg by mouth daily.        [name] arformoterol (BROVANA) 15 MCG/2ML NEBU Take 15 mcg by nebulization 2 (two) times daily.        [name] aspirin 81 MG tablet Take 162 mg by mouth daily.       . beclomethasone (QVAR) 80 MCG/ACT inhaler 2 puffs 2 (two) times daily.       . isosorbide mononitrate (IMDUR) 30 MG 24 hr tablet Take 15 mg by mouth daily.        [name] KLOR-CON M20 20 MEQ tablet TAKE ONE TABLET BY  MOUTH DAILY  30 tablet  5  . omeprazole (PRILOSEC) 20 MG capsule TAKE ONE CAPSULE BY MOUTH EVERY DAY  30 capsule  5  . SPIRIVA HANDIHALER 18 MCG inhalation capsule INHALE 1 CAPSULE ONCE DAILY  30 each  6  . XOPENEX HFA 45 MCG/ACT inhaler USE 1-2 PUFFS EVERY 4 HOURS AS NEEDED  1 Inhaler  6    No Known Allergies  Physical Exam:  Blood pressure 120/76, pulse 87, temperature 98.5 F (36.9 C), temperature source Oral, height 5' (1.524 m), weight 132 lb 3.2 oz (59.966 kg), SpO2 99.00%.  General - Thin, speaks in full sentences HEENT - No sinus tenderness, no oral exudate, no LAN, no thyromegaly Cardiac - s1s2 regular, no murmur, pulses symmetric Chest - prolonged exhalation, faint expiratory wheeze most in LUL and RLL, no rales Abdomen - soft, nontender, normal bowel sounds, no organomegaly Extremities - no edema Skin - no rashes Neurologic - normal strength Psychiatric - normal mood, behavior  ECG - normal sinus rhythm  Dg Chest 2 View  12/14/2010  *RADIOLOGY REPORT*  Clinical Data: COPD with cough and chest pain  CHEST - 2 VIEW  Comparison: 09/05/2010  Findings: COPD with hyperinflation of the lungs.  Negative for pneumonia.  Negative for heart failure or mass lesion.  Lungs are clear.   IMPRESSION: COPD.  No acute cardiopulmonary disease.   Original Report Authenticated By: Camelia Phenes, M.D.   Assessment/Plan:  Atypical chest pain Likely related to COPD exacerbation and reflux.  OBSTRUCTIVE CHRONIC BRONCHITIS He has an acute exacerbation.  Will give him course of prednisone and antibiotics.  He is to continue his inhaler regimen.  GERD Advised him to resume using prilosec.     Outpatient Encounter Prescriptions as of 12/14/2010  Medication Sig Dispense Refill  . amLODipine (NORVASC) 5 MG tablet Take 5 mg by mouth daily.        [name] arformoterol (BROVANA) 15 MCG/2ML NEBU Take 15 mcg by nebulization 2 (two) times daily.        [name] aspirin 81 MG tablet Take 162 mg by mouth  daily.       . beclomethasone (QVAR) 80 MCG/ACT inhaler 2 puffs 2 (two) times daily.       . isosorbide mononitrate (IMDUR) 30 MG 24 hr tablet Take 15 mg by mouth daily.        [name] KLOR-CON M20 20 MEQ tablet TAKE ONE TABLET BY MOUTH DAILY  30 tablet  5  . omeprazole (PRILOSEC) 20 MG capsule TAKE ONE CAPSULE BY MOUTH EVERY DAY  30 capsule  5  . predniSONE (DELTASONE) 10 MG tablet 4 pills for 2 days, 3 pills for 2 days, 2 pills for 2 days, then 1 pill daily.  60 tablet  1  . SPIRIVA HANDIHALER 18 MCG inhalation capsule INHALE 1 CAPSULE ONCE DAILY  30 each  6  . XOPENEX HFA 45 MCG/ACT inhaler USE 1-2 PUFFS EVERY 4 HOURS AS NEEDED  1 Inhaler  6  . DISCONTD: predniSONE (DELTASONE) 10 MG tablet Take 10 mg by mouth daily.        . cefUROXime (CEFTIN) 500 MG tablet Take 1 tablet (500 mg total) by mouth 2 (two) times daily.  20 tablet  0  . DISCONTD: nitroGLYCERIN (NITROSTAT) 0.4 MG SL tablet Place 0.4 mg under the tongue every 5 (five) minutes as needed.          Abbee Cremeens Pager:  [phone] 12/14/2010, 3:24 PM

## 2010-12-14 NOTE — Assessment & Plan Note (Signed)
Advised him to resume using prilosec.

## 2010-12-14 NOTE — Assessment & Plan Note (Signed)
He has an acute exacerbation.  Will give him course of prednisone and antibiotics.  He is to continue his inhaler regimen.

## 2010-12-14 NOTE — Assessment & Plan Note (Signed)
Likely related to COPD exacerbation and reflux.

## 2010-12-14 NOTE — Patient Instructions (Signed)
Prednisone 10 mg pills: 4 pills for 2 days, 3 pills for 2 days, 2 pills for 2 days, then 1 pill daily. Ceftin 500 mg twice per day for 10 days Prilosec 20 mg daily Follow up with Dr. Delford Field in one month

## 2010-12-20 ENCOUNTER — Other Ambulatory Visit: Payer: Self-pay

## 2010-12-20 ENCOUNTER — Encounter (HOSPITAL_COMMUNITY): Payer: Self-pay | Admitting: Emergency Medicine

## 2010-12-20 ENCOUNTER — Emergency Department (HOSPITAL_COMMUNITY): Payer: Medicare Other

## 2010-12-20 ENCOUNTER — Inpatient Hospital Stay (HOSPITAL_COMMUNITY)
Admission: EM | Admit: 2010-12-20 | Discharge: 2010-12-27 | DRG: 192 | Disposition: A | Discharge status: Home / Self Care | Payer: Medicare Other | Source: Ambulatory Visit | Attending: Pulmonary Disease | Admitting: Pulmonary Disease

## 2010-12-20 DIAGNOSIS — E876 Hypokalemia: Secondary | ICD-10-CM | POA: Diagnosis present

## 2010-12-20 DIAGNOSIS — I2510 Atherosclerotic heart disease of native coronary artery without angina pectoris: Secondary | ICD-10-CM | POA: Diagnosis present

## 2010-12-20 DIAGNOSIS — K219 Gastro-esophageal reflux disease without esophagitis: Secondary | ICD-10-CM

## 2010-12-20 DIAGNOSIS — Z7982 Long term (current) use of aspirin: Secondary | ICD-10-CM

## 2010-12-20 DIAGNOSIS — R071 Chest pain on breathing: Secondary | ICD-10-CM | POA: Diagnosis present

## 2010-12-20 DIAGNOSIS — I10 Essential (primary) hypertension: Secondary | ICD-10-CM | POA: Diagnosis present

## 2010-12-20 DIAGNOSIS — J449 Chronic obstructive pulmonary disease, unspecified: Secondary | ICD-10-CM

## 2010-12-20 DIAGNOSIS — R079 Chest pain, unspecified: Secondary | ICD-10-CM

## 2010-12-20 DIAGNOSIS — I252 Old myocardial infarction: Secondary | ICD-10-CM

## 2010-12-20 DIAGNOSIS — R7309 Other abnormal glucose: Secondary | ICD-10-CM | POA: Diagnosis not present

## 2010-12-20 DIAGNOSIS — T380X5A Adverse effect of glucocorticoids and synthetic analogues, initial encounter: Secondary | ICD-10-CM | POA: Diagnosis not present

## 2010-12-20 DIAGNOSIS — J441 Chronic obstructive pulmonary disease with (acute) exacerbation: Principal | ICD-10-CM | POA: Diagnosis present

## 2010-12-20 DIAGNOSIS — IMO0002 Reserved for concepts with insufficient information to code with codable children: Secondary | ICD-10-CM

## 2010-12-20 DIAGNOSIS — Z87891 Personal history of nicotine dependence: Secondary | ICD-10-CM

## 2010-12-20 DIAGNOSIS — J471 Bronchiectasis with (acute) exacerbation: Secondary | ICD-10-CM

## 2010-12-20 DIAGNOSIS — Z79899 Other long term (current) drug therapy: Secondary | ICD-10-CM

## 2010-12-20 DIAGNOSIS — Z23 Encounter for immunization: Secondary | ICD-10-CM

## 2010-12-20 LAB — BASIC METABOLIC PANEL
Chloride: 99 mEq/L (ref 96–112)
GFR calc Af Amer: >90 mL/min (ref >90)
GFR calc non Af Amer: >90 mL/min (ref >90)
Potassium: 3.3 mEq/L — ABNORMAL LOW (ref 3.5–5.1)
Sodium: 137 mEq/L (ref 135–145)

## 2010-12-20 LAB — CBC
MCH: 31.1 pg (ref 26.0–34.0)
MCHC: 34.1 g/dL (ref 30.0–36.0)
Platelets: 286 10*3/uL (ref 150–400)
RBC: 4.83 MIL/uL (ref 4.22–5.81)

## 2010-12-20 LAB — DIFFERENTIAL
Basophils Relative: 0 % (ref 0–1)
Eosinophils Absolute: 0.3 10*3/uL (ref 0.0–0.7)
Neutro Abs: 5.9 10*3/uL (ref 1.7–7.7)
Neutrophils Relative %: 70 % (ref 43–77)

## 2010-12-20 LAB — TROPONIN I: Troponin I: <0.3 ng/mL (ref <0.30)

## 2010-12-20 MED ORDER — POTASSIUM CHLORIDE CRYS ER 20 MEQ PO TBCR
20.0000 meq | EXTENDED_RELEASE_TABLET | Freq: Every day | ORAL | Status: DC
  Administered 2010-12-20 – 2010-12-27 (×8): 20 meq via ORAL
  Filled 2010-12-20 (×8): qty 1

## 2010-12-20 MED ORDER — ARFORMOTEROL TARTRATE 15 MCG/2ML IN NEBU
15.0000 ug | INHALATION_SOLUTION | Freq: Two times a day (BID) | RESPIRATORY_TRACT | Status: DC
  Administered 2010-12-21 – 2010-12-25 (×8): 15 ug via RESPIRATORY_TRACT
  Filled 2010-12-20 (×11): qty 2

## 2010-12-20 MED ORDER — AMLODIPINE BESYLATE 5 MG PO TABS
5.0000 mg | ORAL_TABLET | Freq: Every day | ORAL | Status: DC
  Administered 2010-12-21 – 2010-12-27 (×7): 5 mg via ORAL
  Filled 2010-12-20 (×7): qty 1

## 2010-12-20 MED ORDER — POTASSIUM CHLORIDE CRYS ER 20 MEQ PO TBCR
40.0000 meq | EXTENDED_RELEASE_TABLET | Freq: Once | ORAL | Status: AC
  Administered 2010-12-20: 40 meq via ORAL
  Filled 2010-12-20: qty 2

## 2010-12-20 MED ORDER — ACETAMINOPHEN 500 MG PO TABS
1000.0000 mg | ORAL_TABLET | Freq: Once | ORAL | Status: AC
  Administered 2010-12-20: 975 mg via ORAL
  Filled 2010-12-20: qty 2

## 2010-12-20 MED ORDER — ISOSORBIDE MONONITRATE 15 MG HALF TABLET
15.0000 mg | ORAL_TABLET | Freq: Every day | ORAL | Status: DC
  Administered 2010-12-21 – 2010-12-24 (×4): 15 mg via ORAL
  Filled 2010-12-20 (×5): qty 1

## 2010-12-20 MED ORDER — ASPIRIN 81 MG PO TABS: 162.0000 mg | ORAL_TABLET | Freq: Every day | ORAL | Status: DC

## 2010-12-20 MED ORDER — ALBUTEROL SULFATE (5 MG/ML) 0.5% IN NEBU
5.0000 mg | INHALATION_SOLUTION | Freq: Once | RESPIRATORY_TRACT | Status: AC
  Administered 2010-12-20: 5 mg via RESPIRATORY_TRACT
  Filled 2010-12-20: qty 1

## 2010-12-20 MED ORDER — PREDNISONE 20 MG PO TABS
60.0000 mg | ORAL_TABLET | Freq: Once | ORAL | Status: AC
  Administered 2010-12-20: 60 mg via ORAL
  Filled 2010-12-20: qty 3

## 2010-12-20 MED ORDER — ALBUTEROL SULFATE (5 MG/ML) 0.5% IN NEBU
2.5000 mg | INHALATION_SOLUTION | Freq: Four times a day (QID) | RESPIRATORY_TRACT | Status: DC
  Administered 2010-12-20 – 2010-12-22 (×5): 2.5 mg via RESPIRATORY_TRACT
  Filled 2010-12-20 (×5): qty 0.5

## 2010-12-20 MED ORDER — ACETAMINOPHEN 325 MG PO TABS
ORAL_TABLET | ORAL | Status: AC
  Administered 2010-12-20: 975 mg via ORAL
  Filled 2010-12-20: qty 3

## 2010-12-20 MED ORDER — ASPIRIN EC 81 MG PO TBEC
162.0000 mg | DELAYED_RELEASE_TABLET | Freq: Every day | ORAL | Status: DC
  Administered 2010-12-20 – 2010-12-27 (×8): 162 mg via ORAL
  Filled 2010-12-20 (×8): qty 2

## 2010-12-20 MED ORDER — PANTOPRAZOLE SODIUM 40 MG PO TBEC
40.0000 mg | DELAYED_RELEASE_TABLET | Freq: Every day | ORAL | Status: DC
  Administered 2010-12-21: 40 mg via ORAL
  Filled 2010-12-20: qty 1

## 2010-12-20 MED ORDER — METHYLPREDNISOLONE SODIUM SUCC 125 MG IJ SOLR
80.0000 mg | Freq: Three times a day (TID) | INTRAMUSCULAR | Status: DC
  Administered 2010-12-21 – 2010-12-22 (×5): 80 mg via INTRAVENOUS
  Filled 2010-12-20 (×8): qty 1.28

## 2010-12-20 MED ORDER — FLUTICASONE PROPIONATE HFA 44 MCG/ACT IN AERO
2.0000 | INHALATION_SPRAY | Freq: Two times a day (BID) | RESPIRATORY_TRACT | Status: DC
  Administered 2010-12-20: 2 via RESPIRATORY_TRACT
  Filled 2010-12-20: qty 10.6

## 2010-12-20 MED ORDER — ALBUTEROL SULFATE (5 MG/ML) 0.5% IN NEBU
2.5000 mg | INHALATION_SOLUTION | RESPIRATORY_TRACT | Status: DC | PRN
  Administered 2010-12-21 – 2010-12-25 (×3): 2.5 mg via RESPIRATORY_TRACT
  Filled 2010-12-20 (×3): qty 0.5

## 2010-12-20 MED ORDER — HEPARIN SODIUM (PORCINE) 5000 UNIT/ML IJ SOLN
5000.0000 [IU] | Freq: Three times a day (TID) | INTRAMUSCULAR | Status: DC
  Administered 2010-12-20 – 2010-12-27 (×21): 5000 [IU] via SUBCUTANEOUS
  Filled 2010-12-20 (×23): qty 1

## 2010-12-20 MED ORDER — TIOTROPIUM BROMIDE MONOHYDRATE 18 MCG IN CAPS
18.0000 ug | ORAL_CAPSULE | Freq: Every day | RESPIRATORY_TRACT | Status: DC
  Administered 2010-12-22 – 2010-12-24 (×3): 18 ug via RESPIRATORY_TRACT
  Filled 2010-12-20 (×3): qty 5

## 2010-12-20 NOTE — ED Notes (Signed)
Attempted to call report on pt to floor, RN unable to take at this time. Will return call

## 2010-12-20 NOTE — ED Notes (Signed)
Pt reports onset of shortness breath after spraying pesticide spray at home.  Pt states, "I was spraying the spray to kill some bugs".  Pt reports coughing.  Pt denies fever. Pt endorses chest pain after coughing.  Pt endorses increased shortness of breath over th past 3 days without improvement with prescribed asthma medications.

## 2010-12-20 NOTE — ED Notes (Signed)
MD aware of pt complaint of chest pain.  Pt complaining 3/10 burning chest pain.  Pt denies nausea/vomiting/shortness of breath at this time.

## 2010-12-20 NOTE — H&P (Signed)
Name: [name] MRN: [id number] DOB: September 24, 1945    LOS: 0  PCCM ADMISSION NOTE  History of Present Illness:  65 YO asian male presents on 12/12 to [facility] ER with 4 days of progressive chest tightness, non-productive cough, increased wheeze and shortness of breath after spraying Sudden death fogger in his face trying to kill a cockroach. He was seen in the ER by EDP. Treated with SABA and systemic steroids. Currently appears clinically better but still reports significant chest discomfort. He will be admitted for further therapy.    Tests / Events   Past Medical History  Diagnosis Date  . COPD (chronic obstructive pulmonary disease)     former smoker  . GERD (gastroesophageal reflux disease)   . NSTEMI (non-ST elevated myocardial infarction) 6/11    thought due to vasospasm . cath - CAD LAD 40-50%, LCX 40%, RCA 25% EF 65%. lexiscan myoview EF 68% w/o evidence of ischemia or infarct  . HTN (hypertension)   . Asthma    Past Surgical History  Procedure Date  . Left foot surgery     repair - and ankle    Prior to Admission medications   Medication Sig Start Date End Date Taking? Authorizing Provider  amLODipine (NORVASC) 5 MG tablet Take 5 mg by mouth daily.     Yes Historical Provider, MD  arformoterol (BROVANA) 15 MCG/2ML NEBU Take 15 mcg by nebulization 2 (two) times daily.     Yes Historical Provider, MD  aspirin 81 MG tablet Take 162 mg by mouth daily.    Yes Historical Provider, MD  beclomethasone (QVAR) 80 MCG/ACT inhaler 2 puffs 2 (two) times daily.  08/29/10  Yes [name], MD  cefUROXime (CEFTIN) 500 MG tablet Take 1 tablet (500 mg total) by mouth 2 (two) times daily. 12/14/10 12/24/10 Yes Coralyn Helling, MD  isosorbide mononitrate (IMDUR) 30 MG 24 hr tablet Take 15 mg by mouth daily.     Yes Historical Provider, MD  KLOR-CON M20 20 MEQ tablet TAKE ONE TABLET BY MOUTH DAILY 10/04/10  Yes Dolores Patty, MD  omeprazole (PRILOSEC) 20 MG capsule TAKE ONE CAPSULE BY MOUTH EVERY  DAY 12/01/10  Yes [name], MD  predniSONE (DELTASONE) 10 MG tablet 4 pills for 2 days, 3 pills for 2 days, 2 pills for 2 days, then 1 pill daily. Took 3  tablets today 12-11 and due to take 3 tablets tomorrow 12-20-10  12/14/10 12/14/11 Yes Coralyn Helling, MD  SPIRIVA HANDIHALER 18 MCG inhalation capsule INHALE 1 CAPSULE ONCE DAILY 07/25/10  Yes [name], MD  XOPENEX HFA 45 MCG/ACT inhaler USE 1-2 PUFFS EVERY 4 HOURS AS NEEDED 04/11/10  Yes [name], MD   Allergies No Known Allergies  Family History No family history on file.  Social History  reports that he quit smoking about 2 years ago. His smoking use included Cigarettes. He quit after 50 years of use. He has never used smokeless tobacco. His alcohol and drug histories not on file. He still actively smokes.   Review Of Systems   Review of Systems  Constitutional: Negative for fever, chills, weight loss, malaise/fatigue and diaphoresis.  HENT: Negative for hearing loss, ear pain, congestion, sore throat, tinnitus and ear discharge.   Eyes: Negative.   Respiratory: Positive for cough, shortness of breath and wheezing. Negative for hemoptysis and sputum production.   Cardiovascular: Positive for chest pain. Negative for palpitations, orthopnea, claudication, leg swelling and PND.  Gastrointestinal: Negative.  Negative for heartburn.  Genitourinary: Negative.  Musculoskeletal: Negative.   Skin: Negative.  Negative for rash.  Neurological: Positive for headaches. Negative for weakness.  Endo/Heme/Allergies: Negative.   Psychiatric/Behavioral: The patient is nervous/anxious.   (see HPI for additional ROS)  Vital Signs: Temp:  [98.8 F (37.1 C)] 98.8 F (37.1 C) (12/12 0816) Pulse Rate:  [89-95] 94  (12/12 1122) Resp:  [21-28] 24  (12/12 1122) BP: (131-146)/(83-86) 131/83 mmHg (12/12 1122) SpO2:  [93 %-99 %] 93 % (12/12 1122)    Physical Examination: General: awake, oriented, anxious Neuro:  No focal  deficits HEENT: no PND, no JVD, no adenopathy Cardiovascular:  rrr Lungs:  Prolonged expiratory wheeze Abdomen: soft NT Musculoskeletal:  No OM Skin:  No edema brisk CR    Labs and Imaging:  PCXR:clear, no infiltrates.   Lab 12/20/10 0848  NA 137  K 3.3*  CL 99  CO2 31  BUN 11  CREATININE 0.82  GLUCOSE 82    Lab 12/20/10 0848  HGB 15.0  HCT 44.0  WBC 8.4  PLT 286     Assessment and Plan:  Chest pain most likely due to bronchospasm in setting  Acute exacerbation of chronic obstructive airways disease after inhaling Sudden Death Fogger . Plan -admit to med-surg -pulse steroids -scheduled and PRN nebs -cycle enzymes -hope 24-48 hr obs   GERD plan: cont ppi     BABCOCK,PETE 12/20/2010, 11:59 AM  Attending note: I have seen and examined Mr. Berrey and agree with Mr. Hulan Fray note above.    Max Fickle Pager: [phone]

## 2010-12-20 NOTE — ED Notes (Signed)
EKG completed at this time time.  Pt receiving breathing treatment at this time. Pt with no acute distress at this time.

## 2010-12-20 NOTE — ED Notes (Signed)
Pulmonology MD at bedside

## 2010-12-20 NOTE — ED Notes (Signed)
Inserted Foley Catheter with no resistance. Pt complained of minimal pain

## 2010-12-20 NOTE — ED Notes (Signed)
Ambulated pt around nursing area without oxygen.  Pt able to ambulate greater than 300 feet without increased work of breathing.  SaO2=93% on RA prior to ambulation.  SaO2 = 94% after ambulation.  Pt. Complained of dizziness and chest tightness during ambulation.  MD aware of pt complaint of chest tightness and dizziness.

## 2010-12-20 NOTE — ED Notes (Signed)
Pt with even and unlabored respirations at this time.  Pt resting comfortably with no acute distress.  Minimal expiratory wheezing noted in right upper 1/3. No needs expressed by pt when assistance offered at this time.

## 2010-12-20 NOTE — ED Provider Notes (Signed)
History     CSN: [id number] Arrival date & time: 12/20/2010  8:02 AM   None     Chief Complaint  Patient presents with  . Shortness of Breath    (Consider location/radiation/quality/duration/timing/severity/associated sxs/prior treatment) HPI Complains of pleuritic chest pain with shortness of breath and coughing onset 4 days ago no known fever feels like asthma he's had in the past. Treated with Xopenex without adequate relief. Complains of diffuse chest pain worse with coughing or deep inspiration constant, nonradiating nonexertional not improved by anything Past Medical History  Diagnosis Date  . COPD (chronic obstructive pulmonary disease)     former smoker  . GERD (gastroesophageal reflux disease)   . NSTEMI (non-ST elevated myocardial infarction) 6/11    thought due to vasospasm . cath - CAD LAD 40-50%, LCX 40%, RCA 25% EF 65%. lexiscan myoview EF 68% w/o evidence of ischemia or infarct  . HTN (hypertension)     Past Surgical History  Procedure Date  . Left foot surgery     repair - and ankle     No family history on file.  History  Substance Use Topics  . Smoking status: Former Smoker -- 50 years    Types: Cigarettes    Quit date: 01/09/2008  . Smokeless tobacco: Never Used   Comment: rolled own cigarettes since age of 51,   . Alcohol Use: Not on file     occassional       Review of Systems  Constitutional: Negative.   HENT: Negative.   Respiratory: Positive for cough and shortness of breath.   Cardiovascular: Positive for chest pain.  Gastrointestinal: Negative.   Musculoskeletal: Negative.   Skin: Negative.   Neurological: Negative.   Hematological: Negative.   Psychiatric/Behavioral: Negative.     Allergies  Review of patient's allergies indicates no known allergies.  Home Medications   Current Outpatient Rx  Name Route Sig Dispense Refill  . AMLODIPINE BESYLATE 5 MG PO TABS Oral Take 5 mg by mouth daily.      . ARFORMOTEROL TARTRATE 15  MCG/2ML IN NEBU Nebulization Take 15 mcg by nebulization 2 (two) times daily.      . ASPIRIN 81 MG PO TABS Oral Take 162 mg by mouth daily.     . BECLOMETHASONE DIPROPIONATE 80 MCG/ACT IN AERS  2 puffs 2 (two) times daily.     [name] CEFUROXIME AXETIL 500 MG PO TABS Oral Take 1 tablet (500 mg total) by mouth 2 (two) times daily. 20 tablet 0  . ISOSORBIDE MONONITRATE ER 30 MG PO TB24 Oral Take 15 mg by mouth daily.      [name] KLOR-CON M20 20 MEQ PO TBCR  TAKE ONE TABLET BY MOUTH DAILY 30 tablet 5  . OMEPRAZOLE 20 MG PO CPDR  TAKE ONE CAPSULE BY MOUTH EVERY DAY 30 capsule 5  . PREDNISONE 10 MG PO TABS  4 pills for 2 days, 3 pills for 2 days, 2 pills for 2 days, then 1 pill daily. 60 tablet 1  . SPIRIVA HANDIHALER 18 MCG IN CAPS  INHALE 1 CAPSULE ONCE DAILY 30 each 6  . XOPENEX HFA 45 MCG/ACT IN AERO  USE 1-2 PUFFS EVERY 4 HOURS AS NEEDED 1 Inhaler 6    There were no vitals taken for this visit.  Physical Exam  Constitutional: He appears well-developed and well-nourished. No distress.  HENT:  Head: Normocephalic and atraumatic.  Eyes: Conjunctivae are normal. Pupils are equal, round, and reactive to light.  Neck: Neck supple.  No tracheal deviation present. No thyromegaly present.  Cardiovascular: Normal rate and regular rhythm.   No murmur heard. Pulmonary/Chest: Effort normal. He has wheezes.       Prolonged expiratory phase with expiratory wheezes no respiratory distress  Abdominal: Soft. Bowel sounds are normal. He exhibits no distension. There is no tenderness.  Musculoskeletal: Normal range of motion. He exhibits no edema and no tenderness.  Neurological: He is alert. Coordination normal.  Skin: Skin is warm and dry. No rash noted.  Psychiatric: He has a normal mood and affect.    ED Course  Procedures (including critical care time)  Labs Reviewed - No data to display No results found.  Date: 12/20/2010  Rate: 80  Rhythm: normal sinus rhythm  QRS Axis: normal  Intervals: normal   ST/T Wave abnormalities: normal  Conduction Disutrbances:none  Narrative Interpretation:   Old EKG Reviewed: unchanged Unchanged from 06/19/2009  No diagnosis found.  9:45 AM patient continued to complain of chest pain to the nurse. Pain is pleuritic and worse with moving or position change. Repeat EKG performed at 9:50 AM  Date: 12/20/2010  Rate: 90  Rhythm: normal sinus rhythm  QRS Axis: normal  Intervals: normal  ST/T Wave abnormalities: normal  Conduction Disutrbances:none  Narrative Interpretation:   Old EKG Reviewed: unchanged  Results for orders placed during the hospital encounter of 12/20/10  CBC      Component Value Range   WBC 8.4  4.0 - 10.5 (K/uL)   RBC 4.83  4.22 - 5.81 (MIL/uL)   Hemoglobin 15.0  13.0 - 17.0 (g/dL)   HCT 16.1  09.6 - 04.5 (%)   MCV 91.1  78.0 - 100.0 (fL)   MCH 31.1  26.0 - 34.0 (pg)   MCHC 34.1  30.0 - 36.0 (g/dL)   RDW 40.9  81.1 - 91.4 (%)   Platelets 286  150 - 400 (K/uL)  DIFFERENTIAL      Component Value Range   Neutrophils Relative 70  43 - 77 (%)   Neutro Abs 5.9  1.7 - 7.7 (K/uL)   Lymphocytes Relative 13  12 - 46 (%)   Lymphs Abs 1.1  0.7 - 4.0 (K/uL)   Monocytes Relative 13 (*) 3 - 12 (%)   Monocytes Absolute 1.1 (*) 0.1 - 1.0 (K/uL)   Eosinophils Relative 4  0 - 5 (%)   Eosinophils Absolute 0.3  0.0 - 0.7 (K/uL)   Basophils Relative 0  0 - 1 (%)   Basophils Absolute 0.0  0.0 - 0.1 (K/uL)  BASIC METABOLIC PANEL      Component Value Range   Sodium 137  135 - 145 (mEq/L)   Potassium 3.3 (*) 3.5 - 5.1 (mEq/L)   Chloride 99  96 - 112 (mEq/L)   CO2 31  19 - 32 (mEq/L)   Glucose, Bld 82  70 - 99 (mg/dL)   BUN 11  6 - 23 (mg/dL)   Creatinine, Ser 7.82  0.50 - 1.35 (mg/dL)   Calcium 8.4  8.4 - 95.6 (mg/dL)   GFR calc non Af Amer >90  >90 (mL/min)   GFR calc Af Amer >90  >90 (mL/min)  TROPONIN I      Component Value Range   Troponin I <0.30  <0.30 (ng/mL)   Dg Chest 2 View  12/14/2010  *RADIOLOGY REPORT*  Clinical Data:  COPD with cough and chest pain  CHEST - 2 VIEW  Comparison: 09/05/2010  Findings: COPD with hyperinflation of the lungs.  Negative  for pneumonia.  Negative for heart failure or mass lesion.  Lungs are clear.  IMPRESSION: COPD.  No acute cardiopulmonary disease.  Original Report Authenticated By: Camelia Phenes, M.D.   Dg Chest Port 1 View  12/20/2010  *RADIOLOGY REPORT*  Clinical Data: Shortness of breath.  PORTABLE CHEST - 1 VIEW  Comparison: 12/14/2010.  Findings: COPD. No infiltrate, congestive heart failure or pneumothorax.  Mildly tortuous aorta.  Central pulmonary vascular prominence.  IMPRESSION: COPD.  No segmental infiltrate.  Mildly tortuous aorta.  Original Report Authenticated By: Fuller Canada, M.D.  11:40 AM pain improved after treatment with Tylenol. Patient states she is breathing normally. Pulmonary critical care called by me to arrange for follow. They will come to evaluate patient in the emergency department  MDM  Chest pain felt to be musculoskeletal in etiology and atypical for acute coronary syndrome Diagnosis: Exacerbation of COPD #2 atypical chest pain #3 hypokalemia      Patient be admitted by pulmonary critical care service  Doug Sou, MD 12/20/10 1210

## 2010-12-21 DIAGNOSIS — R079 Chest pain, unspecified: Secondary | ICD-10-CM

## 2010-12-21 DIAGNOSIS — J449 Chronic obstructive pulmonary disease, unspecified: Secondary | ICD-10-CM

## 2010-12-21 LAB — CARDIAC PANEL(CRET KIN+CKTOT+MB+TROPI)
Relative Index: INVALID (ref 0.0–2.5)
Relative Index: INVALID (ref 0.0–2.5)
Relative Index: INVALID (ref 0.0–2.5)
Total CK: 64 U/L (ref 7–232)
Total CK: 57 U/L (ref 7–232)
Troponin I: <0.3 ng/mL (ref <0.30)
Troponin I: <0.3 ng/mL (ref <0.30)

## 2010-12-21 LAB — BASIC METABOLIC PANEL
BUN: 20 mg/dL (ref 6–23)
Calcium: 8.7 mg/dL (ref 8.4–10.5)
GFR calc Af Amer: >90 mL/min (ref >90)
GFR calc non Af Amer: >90 mL/min (ref >90)
Glucose, Bld: 170 mg/dL — ABNORMAL HIGH (ref 70–99)
Potassium: 4.3 mEq/L (ref 3.5–5.1)
Sodium: 136 mEq/L (ref 135–145)

## 2010-12-21 LAB — CBC
HCT: 46.9 % (ref 39.0–52.0)
Hemoglobin: 15.4 g/dL (ref 13.0–17.0)
MCH: 30 pg (ref 26.0–34.0)
MCHC: 32.8 g/dL (ref 30.0–36.0)
RDW: 13.7 % (ref 11.5–15.5)

## 2010-12-21 LAB — GLUCOSE, CAPILLARY
Glucose-Capillary: 205 mg/dL — ABNORMAL HIGH (ref 70–99)
Glucose-Capillary: 143 mg/dL — ABNORMAL HIGH (ref 70–99)

## 2010-12-21 MED ORDER — TRAMADOL HCL 50 MG PO TABS
50.0000 mg | ORAL_TABLET | Freq: Four times a day (QID) | ORAL | Status: DC
  Administered 2010-12-21 – 2010-12-27 (×24): 50 mg via ORAL
  Filled 2010-12-21 (×27): qty 1

## 2010-12-21 MED ORDER — INSULIN ASPART 100 UNIT/ML ~~LOC~~ SOLN
2.0000 [IU] | Freq: Three times a day (TID) | SUBCUTANEOUS | Status: DC
  Administered 2010-12-21: 5 [IU] via SUBCUTANEOUS
  Administered 2010-12-21: 2 [IU] via SUBCUTANEOUS
  Administered 2010-12-21: 3 [IU] via SUBCUTANEOUS
  Administered 2010-12-22 (×2): 2 [IU] via SUBCUTANEOUS
  Administered 2010-12-22 – 2010-12-23 (×4): 3 [IU] via SUBCUTANEOUS
  Administered 2010-12-24 (×2): 2 [IU] via SUBCUTANEOUS
  Administered 2010-12-24: 5 [IU] via SUBCUTANEOUS
  Administered 2010-12-25 (×3): 3 [IU] via SUBCUTANEOUS
  Administered 2010-12-26: 2 [IU] via SUBCUTANEOUS
  Administered 2010-12-26: 3 [IU] via SUBCUTANEOUS
  Administered 2010-12-26: 2 [IU] via SUBCUTANEOUS
  Administered 2010-12-26 – 2010-12-27 (×2): 3 [IU] via SUBCUTANEOUS
  Filled 2010-12-21: qty 3

## 2010-12-21 MED ORDER — BUDESONIDE 0.5 MG/2ML IN SUSP
0.5000 mg | Freq: Two times a day (BID) | RESPIRATORY_TRACT | Status: DC
  Administered 2010-12-21 – 2010-12-27 (×12): 0.5 mg via RESPIRATORY_TRACT
  Filled 2010-12-21 (×14): qty 2

## 2010-12-21 MED ORDER — PANTOPRAZOLE SODIUM 40 MG PO TBEC
40.0000 mg | DELAYED_RELEASE_TABLET | Freq: Two times a day (BID) | ORAL | Status: DC
  Administered 2010-12-21 – 2010-12-27 (×12): 40 mg via ORAL
  Filled 2010-12-21 (×11): qty 1

## 2010-12-21 NOTE — Progress Notes (Signed)
Name: [name] MRN: [id number] DOB: 01/02/1946    LOS: 1  PCCM f/u NOTE  History of Present Illness:  65 YO asian male presented on 12/12 to [facility] ER with 4 days of progressive chest tightness, non-productive cough, increased wheeze and shortness of breath after spraying Sudden death fogger in his face trying to kill a cockroach. He was seen in the ER by EDP. Treated with SABA and systemic steroids> persistent chest discomfor so admitted for further therapy.    Subjective Still no improvement  Vital Signs: Temp:  [98 F (36.7 C)-98.3 F (36.8 C)] 98 F (36.7 C) (12/13 0600) Pulse Rate:  [63-94] 67  (12/13 0600) Resp:  [17-24] 20  (12/13 0600) BP: (113-131)/(63-83) 118/63 mmHg (12/13 0600) SpO2:  [93 %-100 %] 97 % (12/13 0843) Weight:  [58.06 kg (128 lb)] 128 lb (58.06 kg) (12/12 2258) I/O last 3 completed shifts: In: 240 [P.O.:240] Out: -   Physical Examination: General: awake, oriented, anxious Neuro:  No focal deficits HEENT: no PND, no JVD, no adenopathy Cardiovascular:  rrr Lungs:  Prolonged expiratory wheeze, without sig change c/w exam on 12/12 Abdomen: soft NT Musculoskeletal:  No OM Skin:  No edema brisk CR    Labs and Imaging:  PCXR:clear, no infiltrates.   Lab 12/21/10 0545 12/20/10 0848  NA 136 137  K 4.3 3.3*  CL 102 99  CO2 24 31  BUN 20 11  CREATININE 0.60 0.82  GLUCOSE 170* 82    Lab 12/21/10 0545 12/20/10 0848  HGB 15.4 15.0  HCT 46.9 44.0  WBC 7.4 8.4  PLT 309 286     Assessment and Plan:  Chest pain most likely due to bronchospasm in setting  Acute exacerbation of chronic obstructive airways disease after inhaling Sudden Death Fogger . Cardiac enzymes negative.  Plan -pulse steroids (no change in current rx) -scheduled and PRN nebs -cont current rx. Hope d/c home next 24 to 48 hrs.   GERD plan: cont ppi max dose   Hyperglycemia (steroid induced) CBG (last 3)  No results found for this basename: GLUCAP:3 in the last 72  hours Plan: -start ssi   BABCOCK,PETE   Pt seen and examined, xrays reviewed, agree with above imp and plan  Sandrea Hughs, MD Pulmonary and Critical Care Medicine [facility] Cell [phone]

## 2010-12-21 NOTE — Plan of Care (Signed)
Problem: Phase I Progression Outcomes Goal: OOB as tolerated unless otherwise ordered Outcome: Progressing Advised on safety and out of bed   with assist smoking cessation discussed pt need  consult

## 2010-12-21 NOTE — Plan of Care (Signed)
Problem: Consults Goal: General Medical Patient Education See Patient Education Module for specific education. Outcome: Completed/Met Date Met:  12/21/10 pneunonia vaccine education

## 2010-12-22 ENCOUNTER — Inpatient Hospital Stay (HOSPITAL_COMMUNITY): Payer: Medicare Other

## 2010-12-22 DIAGNOSIS — K219 Gastro-esophageal reflux disease without esophagitis: Secondary | ICD-10-CM

## 2010-12-22 LAB — GLUCOSE, CAPILLARY
Glucose-Capillary: 146 mg/dL — ABNORMAL HIGH (ref 70–99)
Glucose-Capillary: 173 mg/dL — ABNORMAL HIGH (ref 70–99)

## 2010-12-22 MED ORDER — ALBUTEROL SULFATE (5 MG/ML) 0.5% IN NEBU
2.5000 mg | INHALATION_SOLUTION | Freq: Four times a day (QID) | RESPIRATORY_TRACT | Status: DC
  Administered 2010-12-22 – 2010-12-23 (×5): 2.5 mg via RESPIRATORY_TRACT
  Filled 2010-12-22 (×4): qty 0.5

## 2010-12-22 MED ORDER — METHYLPREDNISOLONE SODIUM SUCC 125 MG IJ SOLR
80.0000 mg | Freq: Two times a day (BID) | INTRAMUSCULAR | Status: DC
  Administered 2010-12-22 – 2010-12-25 (×6): 80 mg via INTRAVENOUS
  Filled 2010-12-22 (×6): qty 1.28

## 2010-12-22 NOTE — Progress Notes (Signed)
Name: [name] MRN: [id number] DOB: 03/23/1945    LOS: 2  PCCM f/u NOTE  History of Present Illness:  65 YO asian male presented  12/12 to [facility] ER with 4 days of progressive chest tightness, non-productive cough, increased wheeze and shortness of breath after spraying Sudden death fogger in his face trying to kill a cockroach. He was seen in the ER by EDP. Treated with SABA and systemic steroids> persistent chest discomfor so admitted for further therapy.    Subjective Chest pain a little better but still SOB.   Vital Signs: Temp:  [97.8 F (36.6 C)-98.3 F (36.8 C)] 97.8 F (36.6 C) (12/14 0600) Pulse Rate:  [77-94] 77  (12/14 0600) Resp:  [16-20] 16  (12/14 0600) BP: (110-128)/(72-86) 128/86 mmHg (12/14 0600) SpO2:  [91 %-98 %] 95 % (12/14 0842) Weight:  [58 kg (127 lb 13.9 oz)-58.786 kg (129 lb 9.6 oz)] 127 lb 13.9 oz (58 kg) (12/14 0600) I/O last 3 completed shifts: In: 120 [P.O.:120] Out: -   Physical Examination: General: awake, oriented, anxious Neuro:  No focal deficits HEENT: no PND, no JVD, no adenopathy Cardiovascular:  rrr Lungs:  Prolonged expiratory wheeze, air movement a little better  Abdomen: soft NT Musculoskeletal:  No OM Skin:  No edema brisk CR    Labs and Imaging:  PA and Lat cxr 12/14 COPD. No active lung disease.     Assessment and Plan:  Chest pain most likely due to bronchospasm in setting  Acute exacerbation of chronic obstructive airways disease after inhaling Sudden Death Fogger . Cardiac enzymes negative. Slowly improving Plan -pulse steroids -scheduled and PRN nebs -cont current rx. Hope d/c home 12/17  GERD plan: cont ppi max dose   Hyperglycemia (steroid induced) CBG (last 3)   Basename 12/22/10 0647 12/21/10 2239 12/21/10 1648  GLUCAP 146* 143* 171*   Plan: -cont  ssi   BABCOCK,PETE   Pt seen and examined, xrays reviewed, agree with above imp and plan  Sandrea Hughs, MD Pulmonary and Critical Care  Medicine [facility] Cell [phone]

## 2010-12-23 DIAGNOSIS — J471 Bronchiectasis with (acute) exacerbation: Secondary | ICD-10-CM

## 2010-12-23 LAB — GLUCOSE, CAPILLARY
Glucose-Capillary: 156 mg/dL — ABNORMAL HIGH (ref 70–99)
Glucose-Capillary: 158 mg/dL — ABNORMAL HIGH (ref 70–99)

## 2010-12-23 MED ORDER — GUAIFENESIN ER 600 MG PO TB12
1200.0000 mg | ORAL_TABLET | Freq: Two times a day (BID) | ORAL | Status: DC
  Administered 2010-12-23 – 2010-12-27 (×9): 1200 mg via ORAL
  Filled 2010-12-23 (×10): qty 2

## 2010-12-23 MED ORDER — ALBUTEROL SULFATE (5 MG/ML) 0.5% IN NEBU
2.5000 mg | INHALATION_SOLUTION | Freq: Three times a day (TID) | RESPIRATORY_TRACT | Status: DC
  Administered 2010-12-23 – 2010-12-24 (×2): 2.5 mg via RESPIRATORY_TRACT
  Filled 2010-12-23 (×2): qty 0.5

## 2010-12-23 NOTE — Progress Notes (Signed)
Name: [name] MRN: [id number] DOB: 05/14/1945    LOS: 3  PCCM f/u NOTE  History of Present Illness:  65 YO asian male presented 12/12 to [facility] ER with 4 days of progressive chest tightness, non-productive cough, increased wheeze and shortness of breath after spraying "sudden death fogger" in his face trying to kill a cockroach. He was seen in the ER by EDP. Treated with SABA and systemic steroids> persistent chest discomfort> so admitted for further therapy.    Subjective Chest pain a little better but still SOB.   Vital Signs: Temp:  [98.1 F (36.7 C)-98.5 F (36.9 C)] 98.1 F (36.7 C) (12/15 0538) Pulse Rate:  [87-92] 87  (12/15 0538) Resp:  [16-22] 20  (12/15 0538) BP: (116-136)/(63-82) 136/82 mmHg (12/15 0538) SpO2:  [94 %-96 %] 94 % (12/15 0538) Weight:  [59.2 kg (130 lb 8.2 oz)] 130 lb 8.2 oz (59.2 kg) (12/15 0500) I/O last 3 completed shifts: In: 960 [P.O.:960] Out: -   MEDS: Reviewed in EPIC> Solumedrol 80mg  Q12H Albut/ budes NEBS Spiriva   Physical Examination: General: awake, oriented, anxious Neuro:  No focal deficits HEENT: no PND, no JVD, no adenopathy Cardiovascular:  rrr Lungs:  Prolonged expiratory wheeze, air movement a little better  Abdomen: soft NT Musculoskeletal:  No OM Skin:  No edema brisk CR    Imaging:  PA and Lat cxr 12/14: COPD. No active lung disease.  Labs: FBS=115; BS yest 143-176     Assessment and Plan:  1. Chest pain most likely due to bronchospasm in setting of acute exacerbation of chronic obstructive airways disease after inhaling Sudden Death Fogger .  NSTEMI (non-ST elevated myocardial infarction) 6/11 - thought due to vasospasm . cath - CAD LAD 40-50%, LCX 40%, RCA 25% EF 65%. lexiscan myoview EF 68% w/o evidence of ischemia or infarct 2. COPD - former smoker, followed by DrWright:  -Cardiac enzymes negative.Pt slowly improving Plan: -add Mucinex -pulse steroids -scheduled and PRN nebs -cont current rx. Hope d/c  home 12/17  2. GERD plan: cont ppi max dose   3. Hyperglycemia (steroid induced) CBG (last 3)  Plan: -cont  ssi   Darriona Dehaas M Pulmonary and Critical Care Medicine [employer]

## 2010-12-24 DIAGNOSIS — R079 Chest pain, unspecified: Secondary | ICD-10-CM

## 2010-12-24 DIAGNOSIS — K219 Gastro-esophageal reflux disease without esophagitis: Secondary | ICD-10-CM

## 2010-12-24 DIAGNOSIS — J449 Chronic obstructive pulmonary disease, unspecified: Secondary | ICD-10-CM

## 2010-12-24 DIAGNOSIS — J471 Bronchiectasis with (acute) exacerbation: Secondary | ICD-10-CM

## 2010-12-24 LAB — GLUCOSE, CAPILLARY
Glucose-Capillary: 126 mg/dL — ABNORMAL HIGH (ref 70–99)
Glucose-Capillary: 210 mg/dL — ABNORMAL HIGH (ref 70–99)

## 2010-12-24 LAB — EXPECTORATED SPUTUM ASSESSMENT W GRAM STAIN, RFLX TO RESP C

## 2010-12-24 MED ORDER — ALBUTEROL SULFATE (5 MG/ML) 0.5% IN NEBU
2.5000 mg | INHALATION_SOLUTION | Freq: Two times a day (BID) | RESPIRATORY_TRACT | Status: DC
  Administered 2010-12-24: 2.5 mg via RESPIRATORY_TRACT
  Filled 2010-12-24: qty 0.5

## 2010-12-24 MED ORDER — SENNA 8.6 MG PO TABS
1.0000 | ORAL_TABLET | Freq: Every day | ORAL | Status: DC
  Administered 2010-12-24 – 2010-12-26 (×3): 8.6 mg via ORAL
  Filled 2010-12-24 (×5): qty 1

## 2010-12-24 MED ORDER — POLYETHYLENE GLYCOL 3350 17 G PO PACK
17.0000 g | PACK | Freq: Every day | ORAL | Status: DC
  Administered 2010-12-24 – 2010-12-27 (×4): 17 g via ORAL
  Filled 2010-12-24 (×5): qty 1

## 2010-12-24 MED ORDER — LEVOFLOXACIN 750 MG PO TABS
750.0000 mg | ORAL_TABLET | Freq: Every day | ORAL | Status: DC
  Administered 2010-12-24: 750 mg via ORAL
  Filled 2010-12-24 (×2): qty 1

## 2010-12-24 NOTE — Progress Notes (Signed)
Name: [name] MRN: [id number] DOB: 1945/08/19    LOS: 4  PCCM PROGRESS NOTE  History of Present Illness:  65 YO asian male presented 12/12 to [facility] ER with 4 days of progressive chest tightness, non-productive cough, increased wheeze and shortness of breath after spraying "sudden death fogger" in his face trying to kill a cockroach. He was seen in the ER by EDP. Treated with SABA and systemic steroids> persistent chest discomfort> so admitted for further therapy.    Subjective "Same, no better" he says. Notes congestion, wheezing & coughing up thick green "oysters"  Vital Signs: Temp:  [98 F (36.7 C)-98.6 F (37 C)] 98 F (36.7 C) (12/16 0611) Pulse Rate:  [85-99] 99  (12/16 0611) Resp:  [20-22] 22  (12/16 0611) BP: (122-163)/(69-81) 163/81 mmHg (12/16 0611) SpO2:  [93 %-98 %] 98 % (12/16 0759) Weight:  [57.9 kg (127 lb 10.3 oz)] 127 lb 10.3 oz (57.9 kg) (12/16 9147) I/O last 3 completed shifts: In: 1320 [P.O.:1320] Out: -   MEDS: Reviewed in EPIC> Solumedrol 80mg  Q12H Albut/ Brovana/ Budes NEBS Spiriva Mucinex & Fluids...   Physical Examination: General: awake, oriented, anxious Neuro:  No focal deficits HEENT: no PND, no JVD, no adenopathy Cardiovascular:  rrr Lungs:  Prolonged expiratory wheeze, air movement a little better, congested cough w/ thick green sputum produced. Abdomen: soft NT Musculoskeletal:  No OM Skin:  No edema brisk CR    Imaging:  PA and Lat Cxr 12/14: COPD. No active lung disease.  Labs: FBS=114; BS yest 115-158  Cultures: No sputum has been sent this adm==> check C&S, start Levaquin po empirically.   Assessment and Plan:  1. Chest pain most likely due to bronchospasm in setting of acute exacerbation of chronic obstructive airways disease after inhaling Sudden Death Fogger :  NSTEMI (non-ST elevated myocardial infarction) 6/11 - thought due to vasospasm . cath - CAD LAD 40-50%, LCX 40%, RCA 25% EF 65%. lexiscan myoview EF 68% w/o  evidence of ischemia or infarct -Cardiac enzymes negative.Pt slowly improving 2. COPD exac- former smoker, followed by DrWright> refractory episode likely due to mucus plugging: Plan: Maximize regimen w/ Solumed, NEBS, Spiriva, Mucinex, Fluids... -check sputum C&S, start Levaquin empirically  2. GERD plan: cont ppi max dose   3. Hyperglycemia (steroid induced) CBG (last 3)  Plan: -cont  ssi   Marlynn Hinckley M Pulmonary and Critical Care Medicine [employer]

## 2010-12-25 LAB — GLUCOSE, CAPILLARY: Glucose-Capillary: 165 mg/dL — ABNORMAL HIGH (ref 70–99)

## 2010-12-25 MED ORDER — MOXIFLOXACIN HCL IN NACL 400 MG/250ML IV SOLN
400.0000 mg | INTRAVENOUS | Status: DC
  Administered 2010-12-25 – 2010-12-27 (×3): 400 mg via INTRAVENOUS
  Filled 2010-12-25 (×3): qty 250

## 2010-12-25 MED ORDER — IPRATROPIUM BROMIDE 0.02 % IN SOLN
0.5000 mg | Freq: Four times a day (QID) | RESPIRATORY_TRACT | Status: DC
  Administered 2010-12-25 – 2010-12-27 (×10): 0.5 mg via RESPIRATORY_TRACT
  Filled 2010-12-25 (×10): qty 2.5

## 2010-12-25 MED ORDER — ISOSORBIDE MONONITRATE ER 30 MG PO TB24
30.0000 mg | ORAL_TABLET | Freq: Every day | ORAL | Status: DC
  Administered 2010-12-25: 15 mg via ORAL
  Administered 2010-12-26 – 2010-12-27 (×2): 30 mg via ORAL
  Filled 2010-12-25 (×3): qty 1

## 2010-12-25 MED ORDER — ALBUTEROL SULFATE (5 MG/ML) 0.5% IN NEBU
2.5000 mg | INHALATION_SOLUTION | RESPIRATORY_TRACT | Status: DC
  Administered 2010-12-25 – 2010-12-27 (×11): 2.5 mg via RESPIRATORY_TRACT
  Filled 2010-12-25 (×11): qty 0.5

## 2010-12-25 MED ORDER — METHYLPREDNISOLONE SODIUM SUCC 125 MG IJ SOLR
80.0000 mg | Freq: Four times a day (QID) | INTRAMUSCULAR | Status: DC
  Administered 2010-12-25 – 2010-12-26 (×5): 80 mg via INTRAVENOUS
  Filled 2010-12-25 (×4): qty 1.28
  Filled 2010-12-25: qty 2
  Filled 2010-12-25: qty 1.28

## 2010-12-25 NOTE — Progress Notes (Signed)
Name: [name] MRN: [id number] DOB: January 16, 1945    LOS: 5  PCCM PROGRESS NOTE  History of Present Illness:  65 YO asian male presented 12/12 to [facility] ER with 4 days of progressive chest tightness, non-productive cough, increased wheeze and shortness of breath after spraying "sudden death fogger" in his face trying to kill a cockroach. He was seen in the ER by EDP. Treated with SABA and systemic steroids> persistent chest discomfort> so admitted for further therapy.    Subjective No real changes, still having chest tightness and cough  Notes congestion, wheezing & coughing up thick green "oysters"  ABX  Levaquin 12/16 (bronchitis)>>12/17 12/17 avelox IV (bronchitis)>>  Cultures: Sputum 12/16>>> Vital Signs: Temp:  [97.9 F (36.6 C)-98 F (36.7 C)] 98 F (36.7 C) (12/17 0607) Pulse Rate:  [79-89] 79  (12/17 0607) Resp:  [20] 20  (12/17 0607) BP: (132-139)/(77-82) 132/82 mmHg (12/17 0607) SpO2:  [92 %-95 %] 92 % (12/17 0615) Weight:  [58.5 kg (128 lb 15.5 oz)] 128 lb 15.5 oz (58.5 kg) (12/17 7829)      Physical Examination: General: awake, oriented, anxious Neuro:  No focal deficits HEENT: no PND, no JVD, no adenopathy Cardiovascular:  rrr Lungs:  Prolonged expiratory wheeze, exp wheeze, poor airflow Abdomen: soft NT Musculoskeletal:  No OM Skin:  No edema brisk CR    Imaging:  PA and Lat Cxr 12/14: COPD. No active lung disease.     Assessment and Plan:  1. Chest pain most likely due to bronchospasm in setting of acute exacerbation of chronic obstructive airways disease after inhaling Sudden Death Fogger :  Doubt chest pain is d/t CAD NSTEMI (non-ST elevated myocardial infarction) 6/11 - thought due to vasospasm . cath - CAD LAD 40-50%, LCX 40%, RCA 25% EF 65%. lexiscan myoview EF 68% w/o evidence of ischemia or infarct -Cardiac enzymes negative  Monitor  2. COPD exac- former smoker, followed by DrWright> refractory episode likely due to mucus plugging: Plan:  Maximize regimen w/ Solumed, NEBS, -increase BD frequency, stop spiriva, add atrovent, flutter valve, Increase medrol frequency,  2. GERD plan: cont ppi max dose   3. Hyperglycemia (steroid induced) CBG (last 3)  Plan: -cont  ssi   [name] Pulmonary and Critical Care Medicine [facility]

## 2010-12-26 DIAGNOSIS — J449 Chronic obstructive pulmonary disease, unspecified: Secondary | ICD-10-CM

## 2010-12-26 DIAGNOSIS — J471 Bronchiectasis with (acute) exacerbation: Secondary | ICD-10-CM

## 2010-12-26 LAB — GLUCOSE, CAPILLARY
Glucose-Capillary: 145 mg/dL — ABNORMAL HIGH (ref 70–99)
Glucose-Capillary: 113 mg/dL — ABNORMAL HIGH (ref 70–99)
Glucose-Capillary: 192 mg/dL — ABNORMAL HIGH (ref 70–99)
Glucose-Capillary: 181 mg/dL — ABNORMAL HIGH (ref 70–99)

## 2010-12-26 LAB — CBC
HCT: 40.8 % (ref 39.0–52.0)
Hemoglobin: 14 g/dL (ref 13.0–17.0)
MCH: 30.6 pg (ref 26.0–34.0)
MCHC: 34.3 g/dL (ref 30.0–36.0)
MCV: 89.3 fL (ref 78.0–100.0)
Platelets: 279 K/uL (ref 150–400)
RBC: 4.57 MIL/uL (ref 4.22–5.81)
RDW: 13.5 % (ref 11.5–15.5)
WBC: 18.5 K/uL — ABNORMAL HIGH (ref 4.0–10.5)

## 2010-12-26 LAB — BASIC METABOLIC PANEL WITH GFR
BUN: 27 mg/dL — ABNORMAL HIGH (ref 6–23)
CO2: 27 meq/L (ref 19–32)
Calcium: 8.3 mg/dL — ABNORMAL LOW (ref 8.4–10.5)
Chloride: 100 meq/L (ref 96–112)
Creatinine, Ser: 0.84 mg/dL (ref 0.50–1.35)
GFR calc Af Amer: >90 mL/min
GFR calc non Af Amer: 90 mL/min — ABNORMAL LOW
Glucose, Bld: 139 mg/dL — ABNORMAL HIGH (ref 70–99)
Potassium: 4.2 meq/L (ref 3.5–5.1)
Sodium: 137 meq/L (ref 135–145)

## 2010-12-26 MED ORDER — METHYLPREDNISOLONE SODIUM SUCC 40 MG IJ SOLR
40.0000 mg | Freq: Three times a day (TID) | INTRAMUSCULAR | Status: DC
  Administered 2010-12-26 – 2010-12-27 (×3): 40 mg via INTRAVENOUS
  Filled 2010-12-26 (×5): qty 1

## 2010-12-26 NOTE — Progress Notes (Signed)
Name: [name] MRN: [id number] DOB: 11-Sep-1945    LOS: 6  PCCM PROGRESS NOTE  History of Present Illness:  65 YO asian male with COPD, FEV 1 63%  presented 12/12 to [facility] ER with 4 days of progressive chest tightness, non-productive cough, increased wheeze and shortness of breath after spraying "sudden death fogger" in his face trying to kill a cockroach. He was seen in the ER by EDP. Treated with SABA and systemic steroids> persistent chest discomfort> so admitted for further therapy.  Pulm [facility]   Subjective No real changes, still having chest tightness and cough  Notes congestion, wheezing & coughing up thick green "oysters" CO sob. Large component of VCD  ABX  Levaquin 12/16 (bronchitis)>>12/17 12/17 avelox IV (bronchitis)>>  Cultures: Sputum 12/16>>>GNR>> Vital Signs: Temp:  [97.9 F (36.6 C)-98 F (36.7 C)] 98 F (36.7 C) (12/18 0552) Pulse Rate:  [85-90] 90  (12/18 0552) Resp:  [18-20] 18  (12/18 0552) BP: (134-142)/(73-83) 136/83 mmHg (12/18 0552) SpO2:  [93 %-95 %] 95 % (12/18 0907) Weight:  [131 lb 2.8 oz (59.5 kg)] 131 lb 2.8 oz (59.5 kg) (12/18 1191)      Physical Examination: General: awake, oriented, anxious Neuro:  No focal deficits HEENT: no PND, no JVD, no adenopathy Cardiovascular:  rrr Lungs:  Prolonged expiratory wheeze, exp wheeze, poor airflow. Large component of VCD  Abdomen: soft NT Musculoskeletal:  No OM Skin:  No edema brisk CR    Imaging:  PA and Lat Cxr 12/14: COPD. No active lung disease.     Assessment and Plan:  1. Chest pain most likely due to bronchospasm in setting of acute exacerbation of chronic obstructive airways disease after inhaling Sudden Death Fogger :  Doubt chest pain is d/t CAD NSTEMI (non-ST elevated myocardial infarction) 6/11 - thought due to vasospasm . cath - CAD LAD 40-50%, LCX 40%, RCA 25% EF 65%. lexiscan myoview EF 68% w/o evidence of ischemia or infarct -Cardiac enzymes negative  Monitor  2. COPD  exac- former smoker, followed by DrWright> refractory episode likely due to mucus plugging: Plan: Maximize regimen w/ Solumed, NEBS, -increase BD frequency, stop spiriva, add atrovent, flutter valve Agree tha this sounds more like upper airway pseudowheeze, hence will decrease medrol frequency  2. GERD plan: cont ppi max dose   3. Hyperglycemia (steroid induced) CBG (last 3)  Plan: -cont  ssi   Brett Canales Minor ACNP [facility] PCCM Pager [phone] till 3 pm If no answer page [phone] 12/26/2010, 9:56 AM Shriyans Kuenzi V.

## 2010-12-27 LAB — CULTURE, RESPIRATORY W GRAM STAIN

## 2010-12-27 LAB — GLUCOSE, CAPILLARY: Glucose-Capillary: 183 mg/dL — ABNORMAL HIGH (ref 70–99)

## 2010-12-27 MED ORDER — IPRATROPIUM BROMIDE 0.02 % IN SOLN: 0.5000 mg | Freq: Four times a day (QID) | RESPIRATORY_TRACT | Status: DC

## 2010-12-27 MED ORDER — ALBUTEROL SULFATE (5 MG/ML) 0.5% IN NEBU: 2.5000 mg | INHALATION_SOLUTION | Freq: Four times a day (QID) | RESPIRATORY_TRACT | Status: DC

## 2010-12-27 MED ORDER — BUDESONIDE 0.5 MG/2ML IN SUSP: 0.5000 mg | Freq: Two times a day (BID) | RESPIRATORY_TRACT | Status: DC

## 2010-12-27 MED ORDER — GUAIFENESIN ER 600 MG PO TB12: 1200.0000 mg | ORAL_TABLET | Freq: Two times a day (BID) | ORAL | Status: DC

## 2010-12-27 MED ORDER — MOXIFLOXACIN HCL 400 MG PO TABS
400.0000 mg | ORAL_TABLET | Freq: Every day | ORAL | Status: DC
  Filled 2010-12-27: qty 1

## 2010-12-27 MED ORDER — PREDNISONE 10 MG PO TABS: ORAL_TABLET | ORAL | Status: DC

## 2010-12-27 MED ORDER — PANTOPRAZOLE SODIUM 40 MG PO TBEC: 40.0000 mg | DELAYED_RELEASE_TABLET | Freq: Two times a day (BID) | ORAL | Status: DC

## 2010-12-27 MED ORDER — MOXIFLOXACIN HCL 400 MG PO TABS: 400.0000 mg | ORAL_TABLET | Freq: Every day | ORAL | Status: AC

## 2010-12-27 MED ORDER — ISOSORBIDE MONONITRATE ER 30 MG PO TB24: 30.0000 mg | ORAL_TABLET | Freq: Every day | ORAL | Status: DC

## 2010-12-27 NOTE — Progress Notes (Signed)
   CARE MANAGEMENT NOTE 12/27/2010  Patient:  [name],[name]   Account Number:  [id number]  Date Initiated:  12/21/2010  Documentation initiated by:  [facility]  Subjective/Objective Assessment:   Acute exacerbation of COPD.     Action/Plan:   Anticipated DC Date:  12/29/2010   Anticipated DC Plan:  HOME/SELF CARE      DC Planning Services  CM consult      Choice offered to / List presented to:     DME arranged  NEBULIZER MACHINE      DME agency  [facility]        Status of service:  Completed, signed off Medicare Important Message given?   (If response is "NO", the following Medicare IM given date fields will be blank) Date Medicare IM given:   Date Additional Medicare IM given:    Discharge Disposition:  HOME/SELF CARE  Per UR Regulation:  Reviewed for med. necessity/level of care/duration of stay  Comments:  12/27/10 Merdith Adan,RN,BSN 1537 PT FOR DC HOME TODAY.  NEEDS HOME NEBULIZER MACHINE. REFERRAL TO [facility] WITH [facility] FOR DME NEEDS. Phone #[phone]   12-27-10 Eulah Pont - [phone] UR completed.  12-21-10 11:27am Avie Arenas, RNBSN [phone] UR Completed.

## 2010-12-27 NOTE — Discharge Summary (Signed)
Physician Discharge Summary  Patient ID: [name] MRN: [id number] DOB/AGE: October 16, 1945 65 y.o.  Admit date: 12/20/2010 Discharge date: 12/27/2010    Discharge Diagnoses:  Principal Problem:  *Chest pain Active Problems:  GERD  Acute exacerbation of chronic obstructive airways disease    Brief Summary: [name] is a 65 y.o. y/o male with a PMH of COPD, GERD, hypertension, asthma the patient presented on 12/12 with 4 days of progressive chest tightness, nonproductive cough, increased wheezing and shortness of breath. Symptoms began after he used a "sudden death fogger" trying to kill cockroach and spray this in his face. Patient was treated in the emergency room with short acting beta agonist and systemic steroids without improvement and pulmonary critical care was called to admit the patient.  Hospital Course by Discharge Summary   Chest pain-- this is most likely due to bronchospasm in the setting of acute exacerbation of COPD after inhaling bug spray. Chest pain likely not do to coronary artery disease. Cardiac enzymes have been negative this admission. He'll continue aspirin on discharge.   Acute exacerbation of chronic obstructive airways disease--likely brought on by inhaling bug spray. However patient has had  refractory episodes most likely due to mucous plugging and upper airway pseudo-wheeze. Patient was initially treated with IV antibiotics, inhaled bronchodilators, steroids, aggressive pulmonary hygiene. In the setting of likely upper airway pseudo-wheeze Spiriva has been stopped, max GERD treatment and patient has been started on every 6 hour nebulizers. We will discharge the patient on this regimen and have him followup with Dr. Delford [name] in the pulmonary office for further determination of new baseline COPD regimen. Patient be discharged on by mouth Avelox to complete a five-day course and by mouth prednisone taper.   GERD-- we'll discharge the patient on max dose PPI, we'll  change him from his low-dose over-the-counter to twice-daily Protonix.  Hyperglycemia-- likely steroid induced. Patient's blood sugar should return to normal as prednisone is tapered down.   ABX  Levaquin 12/16 (bronchitis)>>12/17  12/17 avelox IV (bronchitis)>>    Cultures:  Sputum 12/16>>>GNR>>H Flu    Discharge Exam: General: awake, oriented, anxious  Neuro: No focal deficits  HEENT: no PND, no JVD, no adenopathy  Cardiovascular: rrr  Lungs: resps even non labored on RA, no audible wheeze, component of VCD  Abdomen: soft NT  Musculoskeletal: No OM  Skin: No edema brisk CR   Discharge Labs BMET    Component Value Date/Time   NA 137 12/26/2010 0655   K 4.2 12/26/2010 0655   CL 100 12/26/2010 0655   CO2 27 12/26/2010 0655   GLUCOSE 139* 12/26/2010 0655   BUN 27* 12/26/2010 0655   CREATININE 0.84 12/26/2010 0655   CALCIUM 8.3* 12/26/2010 0655   GFRNONAA 90* 12/26/2010 0655   GFRAA >90 12/26/2010 0655   Lab Results  Component Value Date   WBC 18.5* 12/26/2010   HGB 14.0 12/26/2010   HCT 40.8 12/26/2010   MCV 89.3 12/26/2010   PLT 279 12/26/2010      Discharge Orders    Future Appointments: Provider: Department: Dept Phone: Center:   01/17/2011 10:30 AM [name] Levans, MD [facility] [phone] None       [name]  Home Medication Instructions JYN:[id number]   Printed on:12/27/10 1544  Medication Information                    XOPENEX HFA 45 MCG/ACT inhaler USE 1-2 PUFFS EVERY 4 HOURS AS NEEDED  amLODipine (NORVASC) 5 MG tablet Take 5 mg by mouth daily.             aspirin 81 MG tablet Take 162 mg by mouth daily.            KLOR-CON M20 20 MEQ tablet TAKE ONE TABLET BY MOUTH DAILY           guaiFENesin (MUCINEX) 600 MG 12 hr tablet Take 2 tablets (1,200 mg total) by mouth 2 (two) times daily.           isosorbide mononitrate (IMDUR) 30 MG 24 hr tablet Take 1 tablet (30 mg total) by mouth daily.           moxifloxacin (AVELOX)  400 MG tablet Take 1 tablet (400 mg total) by mouth daily at 6 PM.           predniSONE (DELTASONE) 10 MG tablet 4 tabs PO daily x 3 days then 3 tabs PO daily x 3 days then 2 tabs PO daily x 3 days then 1 tab PO daily x 3 days then STOP            pantoprazole (PROTONIX) 40 MG tablet Take 1 tablet (40 mg total) by mouth 2 (two) times daily before a meal.           albuterol (PROVENTIL) (5 MG/ML) 0.5% nebulizer solution Take 0.5 mLs (2.5 mg total) by nebulization every 6 (six) hours.           budesonide (PULMICORT) 0.5 MG/2ML nebulizer solution Take 2 mLs (0.5 mg total) by nebulization 2 (two) times daily.           ipratropium (ATROVENT) 0.02 % nebulizer solution Take 2.5 mLs (0.5 mg total) by nebulization every 6 (six) hours.              Follow-up Information    Follow up with [name] Levans, MD on 01/17/2011. (1030am)    Contact information:   [address] [facility] [address] [place] [address] [place] [address] [phone]          Disposition: Home  Discharged Condition: [name] has met maximum benefit of inpatient care and is medically stable and cleared for discharge.  Patient is pending follow up as above.      Time spent on disposition:  Greater than 35 minutes.   SignedDanford Bad, NP 12/27/2010  3:57 PM  *Care during the described time interval was provided by me and/or other providers on the critical care team. I have reviewed this patient's available data, including medical history, events of note, physical examination and test results as part of my evaluation.

## 2010-12-27 NOTE — Progress Notes (Signed)
Name: [name] MRN: [id number] DOB: 02-Feb-1945    LOS: 7  PCCM PROGRESS NOTE  History of Present Illness:  65 YO asian male with COPD, FEV 1 63%  presented 12/12 to [facility] ER with 4 days of progressive chest tightness, non-productive cough, increased wheeze and shortness of breath after spraying "sudden death fogger" in his face trying to kill a cockroach. He was seen in the ER by EDP. Treated with SABA and systemic steroids> persistent chest discomfort> so admitted for further therapy.  Pulm [name]   Subjective Much improved, decreased congestion, cough, breathing better  ABX  Levaquin 12/16 (bronchitis)>>12/17 12/17 avelox IV (bronchitis)>>  Cultures: Sputum 12/16>>>GNR>> Vital Signs: Temp:  [97.9 F (36.6 C)] 97.9 F (36.6 C) (12/19 0538) Pulse Rate:  [80-86] 86  (12/19 0538) Resp:  [18] 18  (12/19 0538) BP: (127-142)/(66-81) 142/81 mmHg (12/19 0538) SpO2:  [9 %-97 %] 9 % (12/19 1211) I/O last 3 completed shifts: In: 970 [P.O.:720; IV Piggyback:250] Out: -     Physical Examination: General: awake, oriented, anxious Neuro:  No focal deficits HEENT: no PND, no JVD, no adenopathy Cardiovascular:  rrr Lungs:  Prolonged expiratory wheeze, improved airflow. + component of VCD  Abdomen: soft NT Musculoskeletal:  No OM Skin:  No edema brisk CR    Imaging:  PA and Lat Cxr 12/14: COPD. No active lung disease.     Assessment and Plan:  1. Chest pain most likely due to bronchospasm in setting of acute exacerbation of chronic obstructive airways disease after inhaling Sudden Death Fogger :  Doubt chest pain is d/t CAD NSTEMI (non-ST elevated myocardial infarction) 6/11 - thought due to vasospasm . cath - CAD LAD 40-50%, LCX 40%, RCA 25% EF 65%. lexiscan myoview EF 68% w/o evidence of ischemia or infarct -Cardiac enzymes negative    2. COPD exac- former smoker, followed by DrWright> refractory episode likely due to mucus plugging: Plan: Maximize regimen w/ Solumed,  NEBS, -decrease BD frequency to q6h, stopped spiriva, add atrovent, flutter valve Agree that this sounds more like upper airway pseudowheeze >> change to PO prednisone PO avelox x 5ds total  2. GERD plan: cont ppi max dose   3. Hyperglycemia (steroid induced) CBG (last 3)  Plan: -cont  ssi   Louvinia Cumbo V. [phone] If no answer page [phone] 12/27/2010, 2:19 PM Ziva Nunziata V.

## 2010-12-28 ENCOUNTER — Telehealth: Payer: Self-pay | Admitting: Critical Care Medicine

## 2010-12-28 MED ORDER — ESOMEPRAZOLE MAGNESIUM 40 MG PO CPDR: 40.0000 mg | DELAYED_RELEASE_CAPSULE | Freq: Every day | ORAL | Status: DC

## 2010-12-28 NOTE — Telephone Encounter (Signed)
Spoke with Aneta Mins and called in new rx for nexium. He states that the pulmicort does need a PA and will not be covered under medicare part B. He is faxing PA request form to triage. Will await fax.

## 2010-12-28 NOTE — Telephone Encounter (Signed)
Spoke with Aneta Mins. He states that the protonix is not preffered but nexium is- please advise if okay to switch to this.  I advised that pulmicort should be filed under medicare part B. He states that this pharmacy does not file under part B medicare. He is going to call the insurance company and make sure that this is what the issue is and call us back with recs.

## 2010-12-28 NOTE — Telephone Encounter (Signed)
Called the pharmacy and was placed on hold for over 5 min- WCB later

## 2010-12-28 NOTE — Telephone Encounter (Signed)
Received PA forms for budesonide, atrovent and albuterol neb sol- Called number listed on form [phone] to initiate PA's. Pt ID number [id number].  Was advised by rep that all of the meds need to be filed under part B. [facility] does not file under part B so the pt will need to get these meds from a different pharm.  ATC pt and advise- had to [facility]

## 2010-12-28 NOTE — Telephone Encounter (Signed)
Ok to switch to nexium.

## 2010-12-29 NOTE — Telephone Encounter (Signed)
[facility] does not file with MCR Part B so they cannot fill this medication for the patient. We have also [facility] from [facility] and when I spoke with the pharmacist there they had not tried running with MCR Part B. Pharmacist did this and received an error regarding deductible  And they were going to contact the insurance company to find out what this is about. Pt informed that [facility] cannot fill the Budesonide and [facility] is checking into this for him. I also told him that he may need to take his insurance cards to the pharmacy to make sure they have the correct information.

## 2010-12-29 NOTE — Telephone Encounter (Signed)
Patient returning call.  [phone]

## 2011-01-04 ENCOUNTER — Telehealth: Payer: Self-pay | Admitting: Critical Care Medicine

## 2011-01-04 MED ORDER — ALBUTEROL SULFATE (2.5 MG/3ML) 0.083% IN NEBU: 2.5000 mg | INHALATION_SOLUTION | Freq: Four times a day (QID) | RESPIRATORY_TRACT | Status: DC | PRN

## 2011-01-04 NOTE — Telephone Encounter (Signed)
PER CY ok to send  Albuterol 2.5 mg /20ml every  6 hours in nebulizer. Dx 491.30 [facility]  [address]

## 2011-01-04 NOTE — Telephone Encounter (Signed)
Albuterol prescription was written for Albuterol 5mg /mL sol instead of Albuterol 2.5mg /30mL Sol.  Pls advise if okay to send rx for Albuterol 2.5mg /40mL to use every 6 hours in nebulizer. Prescription needs to be sent with diagnosis code and instructions to file under MCR Part B. Dr. Delford Field is not in the office. Dr. Maple Hudson, pls advise if okay to change Albuterol prescription.

## 2011-01-10 ENCOUNTER — Telehealth: Payer: Self-pay | Admitting: Critical Care Medicine

## 2011-01-10 ENCOUNTER — Other Ambulatory Visit: Payer: Self-pay | Admitting: *Deleted

## 2011-01-10 MED ORDER — BUDESONIDE 0.5 MG/2ML IN SUSP: 0.5000 mg | Freq: Two times a day (BID) | RESPIRATORY_TRACT | Status: DC

## 2011-01-10 NOTE — Telephone Encounter (Signed)
Fax received from [facility] for Budesonide PA. Pharmacist aware that this [facility] has been [facility] [address] .

## 2011-01-13 ENCOUNTER — Other Ambulatory Visit: Payer: Self-pay | Admitting: Critical Care Medicine

## 2011-01-15 ENCOUNTER — Other Ambulatory Visit: Payer: Self-pay | Admitting: *Deleted

## 2011-01-15 MED ORDER — ALBUTEROL SULFATE (2.5 MG/3ML) 0.083% IN NEBU: 2.5000 mg | INHALATION_SOLUTION | Freq: Four times a day (QID) | RESPIRATORY_TRACT | Status: DC | PRN

## 2011-01-17 ENCOUNTER — Ambulatory Visit (INDEPENDENT_AMBULATORY_CARE_PROVIDER_SITE_OTHER): Payer: Medicare HMO | Admitting: Critical Care Medicine

## 2011-01-17 ENCOUNTER — Other Ambulatory Visit: Payer: Self-pay | Admitting: Internal Medicine

## 2011-01-17 ENCOUNTER — Encounter: Payer: Self-pay | Admitting: Critical Care Medicine

## 2011-01-17 DIAGNOSIS — J449 Chronic obstructive pulmonary disease, unspecified: Secondary | ICD-10-CM

## 2011-01-17 MED ORDER — MOXIFLOXACIN HCL 400 MG PO TABS: 400.0000 mg | ORAL_TABLET | Freq: Every day | ORAL | Status: AC

## 2011-01-17 MED ORDER — PREDNISONE 10 MG PO TABS: ORAL_TABLET | ORAL | Status: DC

## 2011-01-17 MED ORDER — ISOSORBIDE MONONITRATE ER 30 MG PO TB24: 30.0000 mg | ORAL_TABLET | Freq: Every day | ORAL | Status: DC

## 2011-01-17 NOTE — Progress Notes (Signed)
Subjective:    Patient ID: [name], male    DOB: 25-Jul-1945, 66 y.o.   MRN: [id number]  HPI   66 y.o.   Asian male patient with a known history of chronic obstructive pulmonary disease with FEV1 of 63% associated CAD non obstructed with recent Non STEMI 6/11. Hyperlipidemia and HTN   01/17/2011 Pt in hospital in 12/12 for exacerbation. Pt still with chest tightness and cough and is minimal.  Cough is productive of food material Cough is worse in the AM.  Past Medical History  Diagnosis Date  . COPD (chronic obstructive pulmonary disease)     former smoker  . GERD (gastroesophageal reflux disease)   . NSTEMI (non-ST elevated myocardial infarction) 6/11    thought due to vasospasm . cath - CAD LAD 40-50%, LCX 40%, RCA 25% EF 65%. lexiscan myoview EF 68% w/o evidence of ischemia or infarct  . HTN (hypertension)   . Asthma      No family history on file.   History   Social History  . Marital Status: Married    Spouse Name: N/A    Number of Children: N/A  . Years of Education: N/A   Occupational History  . Not on file.   Social History Main Topics  . Smoking status: Former Smoker -- 50 years    Types: Cigarettes    Quit date: 01/09/2008  . Smokeless tobacco: Never Used   Comment: rolled own cigarettes since age of 29,   . Alcohol Use: Not on file     occassional   . Drug Use: Not on file  . Sexually Active: Not on file     pt sts he only smoke 1 cigarette, not every day   Other Topics Concern  . Not on file   Social History Narrative   Married, 4 children.Disability secondary to lung disease.      No Known Allergies   Outpatient Prescriptions Prior to Visit  Medication Sig Dispense Refill  . albuterol (PROVENTIL) (2.5 MG/3ML) 0.083% nebulizer solution Take 3 mLs (2.5 mg total) by nebulization every 6 (six) hours as needed for wheezing. Dx 496,  FILE MCR PART B  360 mL  5  . amLODipine (NORVASC) 5 MG tablet Take 5 mg by mouth daily.        [name] aspirin 81 MG  tablet Take 81 mg by mouth daily.       . budesonide (PULMICORT) 0.5 MG/2ML nebulizer solution Take 2 mLs (0.5 mg total) by nebulization 2 (two) times daily. DX:  496, FILE MCR PART B  120 mL  5  . esomeprazole (NEXIUM) 40 MG capsule Take 1 capsule (40 mg total) by mouth daily.  30 capsule  6  . ipratropium (ATROVENT) 0.02 % nebulizer solution Take 2.5 mLs (0.5 mg total) by nebulization every 6 (six) hours.  300 mL  0  . KLOR-CON M20 20 MEQ tablet TAKE ONE TABLET BY MOUTH DAILY  30 tablet  5  . predniSONE (DELTASONE) 10 MG tablet 4 tabs PO daily x 3 days then 3 tabs PO daily x 3 days then 2 tabs PO daily x 3 days then 1 tab PO daily x 3 days then STOP   30 tablet  0  . XOPENEX HFA 45 MCG/ACT inhaler INHALE 1 TO 2 PUFFS EVERY 4 HOURS AS NEEDED  1 Inhaler  6  . guaiFENesin (MUCINEX) 600 MG 12 hr tablet Take 2 tablets (1,200 mg total) by mouth 2 (two) times daily.      [name]  isosorbide mononitrate (IMDUR) 30 MG 24 hr tablet Take 1 tablet (30 mg total) by mouth daily.      . pantoprazole (PROTONIX) 40 MG tablet Take 1 tablet (40 mg total) by mouth 2 (two) times daily before a meal.  60 tablet  0      Review of Systems  Constitutional:   No  weight loss, night sweats,  Fevers, chills, fatigue, lassitude. HEENT:   No headaches,  Difficulty swallowing,  Tooth/dental problems,  Sore throat,                No sneezing, itching, ear ache, nasal congestion, post nasal drip,   CV:  No chest pain,  Orthopnea, PND, swelling in lower extremities, anasarca, dizziness, palpitations  GI  No heartburn, indigestion, abdominal pain, nausea, vomiting, diarrhea, change in bowel habits, loss of appetite  Resp: Notes shortness of breath with exertion not  at rest.  No excess mucus, no productive cough,  No non-productive cough,  No coughing up of blood.  No change in color of mucus.  No wheezing.  No chest wall deformity  Skin: no rash or lesions.  GU: no dysuria, change in color of urine, no urgency or frequency.   No flank pain.  MS:  No joint pain or swelling.  No decreased range of motion.  No back pain.  Psych:  No change in mood or affect. No depression or anxiety.  No memory loss.     Objective:   Physical Exam   Filed Vitals:   01/17/11 1044  BP: 152/98  Pulse: 82  Temp: 98.5 F (36.9 C)  TempSrc: Oral  Height: 5' (1.524 m)  Weight: 134 lb (60.782 kg)  SpO2: 97%    Gen: Pleasant, well-nourished, in no distress,  normal affect  ENT: No lesions,  mouth clear,  oropharynx clear, no postnasal drip  Neck: No JVD, no TMG, no carotid bruits  Lungs: No use of accessory muscles, no dullness to percussion, distant BS  Cardiovascular: RRR, heart sounds normal, no murmur or gallops, no peripheral edema  Abdomen: soft and NT, no HSM,  BS normal  Musculoskeletal: No deformities, no cyanosis or clubbing  Neuro: alert, non focal  Skin: Warm, no lesions or rashes   8/28  CXR IMPRESSION: There is no evidence of acute cardiac or pulmonary process.       Assessment & Plan:   OBSTRUCTIVE CHRONIC BRONCHITIS Recent hosp stay for Copd AB exacerbation and chest pain syndrome.  ? If any of this is ischemic with pt out of IMDUR The pt did not follow instructions on pred and avelox upon d/c Language barrier   Plan Resume avelox one daily REsume prednisone pulse REsume Imdur, sent to pharmacy Return 3 weeks for recheck with Tammy Parrett>>>Med Calendar      Updated Medication List Outpatient Encounter Prescriptions as of 01/17/2011  Medication Sig Dispense Refill  . albuterol (PROVENTIL) (2.5 MG/3ML) 0.083% nebulizer solution Take 3 mLs (2.5 mg total) by nebulization every 6 (six) hours as needed for wheezing. Dx 496,  FILE MCR PART B  360 mL  5  . amLODipine (NORVASC) 5 MG tablet Take 5 mg by mouth daily.        [name] aspirin 81 MG tablet Take 81 mg by mouth daily.       . beclomethasone (QVAR) 80 MCG/ACT inhaler Inhale 2 puffs into the lungs 2 (two) times daily.      . budesonide  (PULMICORT) 0.5 MG/2ML nebulizer solution Take 2  mLs (0.5 mg total) by nebulization 2 (two) times daily. DX:  496, FILE MCR PART B  120 mL  5  . cefUROXime (CEFTIN) 500 MG tablet Take 500 mg by mouth 2 (two) times daily.      [name] esomeprazole (NEXIUM) 40 MG capsule Take 1 capsule (40 mg total) by mouth daily.  30 capsule  6  . ipratropium (ATROVENT) 0.02 % nebulizer solution Take 2.5 mLs (0.5 mg total) by nebulization every 6 (six) hours.  300 mL  0  . KLOR-CON M20 20 MEQ tablet TAKE ONE TABLET BY MOUTH DAILY  30 tablet  5  . predniSONE (DELTASONE) 10 MG tablet 4 tabs PO daily x 3 days then 3 tabs PO daily x 3 days then 2 tabs PO daily x 3 days then 1 tab PO daily x 3 days then STOP  30 tablet  0  . tiotropium (SPIRIVA) 18 MCG inhalation capsule Place 18 mcg into inhaler and inhale daily.      Pauline Aus HFA 45 MCG/ACT inhaler INHALE 1 TO 2 PUFFS EVERY 4 HOURS AS NEEDED  1 Inhaler  6  . DISCONTD: predniSONE (DELTASONE) 10 MG tablet 4 tabs PO daily x 3 days then 3 tabs PO daily x 3 days then 2 tabs PO daily x 3 days then 1 tab PO daily x 3 days then STOP   30 tablet  0  . isosorbide mononitrate (IMDUR) 30 MG 24 hr tablet Take 1 tablet (30 mg total) by mouth daily.  30 tablet  6  . moxifloxacin (AVELOX) 400 MG tablet Take 1 tablet (400 mg total) by mouth daily.  5 tablet  0  . pantoprazole (PROTONIX) 40 MG tablet Take 1 tablet (40 mg total) by mouth 2 (two) times daily before a meal.  60 tablet  0  . DISCONTD: guaiFENesin (MUCINEX) 600 MG 12 hr tablet Take 2 tablets (1,200 mg total) by mouth 2 (two) times daily.      [name] DISCONTD: isosorbide mononitrate (IMDUR) 30 MG 24 hr tablet Take 1 tablet (30 mg total) by mouth daily.

## 2011-01-17 NOTE — Patient Instructions (Addendum)
Resume avelox one daily REsume prednisone pulse REsume Imdur, sent to pharmacy Return 3 weeks for recheck with Tammy Parrett

## 2011-01-18 NOTE — Assessment & Plan Note (Signed)
Recent hosp stay for Copd AB exacerbation and chest pain syndrome.  ? If any of this is ischemic with pt out of IMDUR The pt did not follow instructions on pred and avelox upon d/c Language barrier   Plan Resume avelox one daily REsume prednisone pulse REsume Imdur, sent to pharmacy Return 3 weeks for recheck with Tammy Parrett>>>Med Calendar

## 2011-02-07 ENCOUNTER — Ambulatory Visit (INDEPENDENT_AMBULATORY_CARE_PROVIDER_SITE_OTHER): Payer: Medicare HMO | Admitting: Adult Health

## 2011-02-07 ENCOUNTER — Encounter: Payer: Self-pay | Admitting: Adult Health

## 2011-02-07 VITALS — BP 140/100 | HR 79 | Temp 97.6°F | Ht 60.0 in | Wt 133.6 lb

## 2011-02-07 DIAGNOSIS — J449 Chronic obstructive pulmonary disease, unspecified: Secondary | ICD-10-CM

## 2011-02-07 DIAGNOSIS — J441 Chronic obstructive pulmonary disease with (acute) exacerbation: Secondary | ICD-10-CM

## 2011-02-07 MED ORDER — OMEPRAZOLE 20 MG PO CPDR: 20.0000 mg | DELAYED_RELEASE_CAPSULE | Freq: Two times a day (BID) | ORAL | Status: DC

## 2011-02-07 MED ORDER — BUDESONIDE 0.5 MG/2ML IN SUSP: 0.5000 mg | Freq: Two times a day (BID) | RESPIRATORY_TRACT | Status: DC

## 2011-02-07 MED ORDER — ARFORMOTEROL TARTRATE 15 MCG/2ML IN NEBU: 15.0000 ug | INHALATION_SOLUTION | Freq: Two times a day (BID) | RESPIRATORY_TRACT | Status: DC

## 2011-02-07 NOTE — Assessment & Plan Note (Signed)
Recent exacerbation now resolved with abx and steroid taper.  Will try to send pulmicort /Brovana thru DME to help with cost factor.   Will cont on current regimen of  spiriva , pulmicort /brovana  May need to change to Symbicort if not covered better by DME.

## 2011-02-07 NOTE — Patient Instructions (Signed)
We are sending PUlmicort NEB request to DME company to see if cheaper.  Continue on current regimen.  follow up Dr. Delford Field  In 6 weeks and As needed

## 2011-02-07 NOTE — Progress Notes (Signed)
Subjective:    Patient ID: [name], male    DOB: 1945/05/31, 66 y.o.   MRN: [id number]  HPI   66 y.o.   Asian male patient with a known history of chronic obstructive pulmonary disease with FEV1 of 63% associated CAD non obstructed with recent Non STEMI 6/11. Hyperlipidemia and HTN   01/17/2011  Pt in hospital in 12/12 for exacerbation. Pt still with chest tightness and cough and is minimal.  Cough is productive of food material Cough is worse in the AM. >rx Avelox and steroid taper   02/07/2011 Follow up  Pt returns for follow up . 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  He feels good now, back to baselline. No increased SABA use.  He is confused with his meds today. Wants to take pulmicort and brovana but too expensive thru pharmacy. Also has 3 PPI on list. He does not know which is covered by insurance. Has bottle of prilosec w/ recent refill. Will contact pharmacy and update MAR.  No fever or chest pain . No edema.     Past Medical History  Diagnosis Date  . COPD (chronic obstructive pulmonary disease)     former smoker  . GERD (gastroesophageal reflux disease)   . NSTEMI (non-ST elevated myocardial infarction) 6/11    thought due to vasospasm . cath - CAD LAD 40-50%, LCX 40%, RCA 25% EF 65%. lexiscan myoview EF 68% w/o evidence of ischemia or infarct  . HTN (hypertension)   . Asthma      No family history on file.   History   Social History  . Marital Status: Married    Spouse Name: N/A    Number of Children: N/A  . Years of Education: N/A   Occupational History  . Not on file.   Social History Main Topics  . Smoking status: Former Smoker -- 50 years    Types: Cigarettes    Quit date: 01/09/2008  . Smokeless tobacco: Never Used   Comment: rolled own cigarettes since age of 70,   . Alcohol Use: Not on file     occassional   . Drug Use: Not on file  . Sexually Active: Not on file     pt sts he only smoke 1 cigarette, not every day    Other Topics Concern  . Not on file   Social History Narrative   Married, 4 children.Disability secondary to lung disease.      No Known Allergies   Outpatient Prescriptions Prior to Visit  Medication Sig Dispense Refill  . amLODipine (NORVASC) 5 MG tablet TAKE 1 TABLET EVERY DAY  30 tablet  6  . aspirin 81 MG tablet Take 81 mg by mouth daily.       . beclomethasone (QVAR) 80 MCG/ACT inhaler Inhale 2 puffs into the lungs 2 (two) times daily.      . budesonide (PULMICORT) 0.5 MG/2ML nebulizer solution Take 2 mLs (0.5 mg total) by nebulization 2 (two) times daily. DX:  496, FILE MCR PART B  120 mL  5  . esomeprazole (NEXIUM) 40 MG capsule Take 1 capsule (40 mg total) by mouth daily.  30 capsule  6  . pantoprazole (PROTONIX) 40 MG tablet Take 1 tablet (40 mg total) by mouth 2 (two) times daily before a meal.  60 tablet  0  . predniSONE (DELTASONE) 10 MG tablet 4 tabs PO daily x 3 days then 3 tabs PO daily x 3 days then 2 tabs  PO daily x 3 days then 1 tab PO daily x 3 days then STOP  30 tablet  0  . tiotropium (SPIRIVA) 18 MCG inhalation capsule Place 18 mcg into inhaler and inhale daily.      Pauline Aus HFA 45 MCG/ACT inhaler INHALE 1 TO 2 PUFFS EVERY 4 HOURS AS NEEDED  1 Inhaler  6  . albuterol (PROVENTIL) (2.5 MG/3ML) 0.083% nebulizer solution Take 3 mLs (2.5 mg total) by nebulization every 6 (six) hours as needed for wheezing. Dx 496,  FILE MCR PART B  360 mL  5  . cefUROXime (CEFTIN) 500 MG tablet Take 500 mg by mouth 2 (two) times daily.      [name] ipratropium (ATROVENT) 0.02 % nebulizer solution Take 2.5 mLs (0.5 mg total) by nebulization every 6 (six) hours.  300 mL  0  . isosorbide mononitrate (IMDUR) 30 MG 24 hr tablet Take 1 tablet (30 mg total) by mouth daily.  30 tablet  6  . KLOR-CON M20 20 MEQ tablet TAKE ONE TABLET BY MOUTH DAILY  30 tablet  5      Review of Systems  Constitutional:   No  weight loss, night sweats,  Fevers, chills, + fatigue, lassitude. HEENT:   No  headaches,  Difficulty swallowing,  Tooth/dental problems,  Sore throat,                No sneezing, itching, ear ache, nasal congestion, post nasal drip,   CV:  No chest pain,  Orthopnea, PND, swelling in lower extremities, anasarca, dizziness, palpitations  GI  No heartburn, indigestion, abdominal pain, nausea, vomiting, diarrhea, change in bowel habits, loss of appetite  Resp:  .  No excess mucus, no productive cough,  No non-productive cough,  No coughing up of blood.  No change in color of mucus.  No wheezing.  No chest wall deformity  Skin: no rash or lesions.  GU: no dysuria, change in color of urine, no urgency or frequency.  No flank pain.  MS:  No joint pain or swelling.  No decreased range of motion.  No back pain.  Psych:  No change in mood or affect. No depression or anxiety.  No memory loss.     Objective:   Physical Exam   Filed Vitals:   02/07/11 0942  BP: 140/100  Pulse: 79  Temp: 97.6 F (36.4 C)  TempSrc: Oral  Height: 5' (1.524 m)  Weight: 133 lb 9.6 oz (60.601 kg)  SpO2: 97%    Gen: Pleasant, well-nourished, in no distress,  normal affect   ENT: No lesions,  mouth clear,  oropharynx clear, no postnasal drip  Neck: No JVD, no TMG, no carotid bruits  Lungs: No use of accessory muscles, no dullness to percussion, distant BS, no wheezing  Cardiovascular: RRR, heart sounds normal, no murmur or gallops, no peripheral edema  Abdomen: soft and NT, no HSM,  BS normal  Musculoskeletal: No deformities, no cyanosis or clubbing  Neuro: alert, non focal  Skin: Warm, no lesions or rashes   8/28  CXR IMPRESSION: There is no evidence of acute cardiac or pulmonary process.       Assessment & Plan:    Updated Medication List Outpatient Encounter Prescriptions as of 02/07/2011  Medication Sig Dispense Refill  . amLODipine (NORVASC) 5 MG tablet TAKE 1 TABLET EVERY DAY  30 tablet  6  . aspirin 81 MG tablet Take 81 mg by mouth daily.       .  beclomethasone (  QVAR) 80 MCG/ACT inhaler Inhale 2 puffs into the lungs 2 (two) times daily.      . budesonide (PULMICORT) 0.5 MG/2ML nebulizer solution Take 2 mLs (0.5 mg total) by nebulization 2 (two) times daily. DX:  496, FILE MCR PART B  120 mL  5  . esomeprazole (NEXIUM) 40 MG capsule Take 1 capsule (40 mg total) by mouth daily.  30 capsule  6  . isosorbide mononitrate (IMDUR) 30 MG 24 hr tablet Take 15 mg by mouth daily.      . pantoprazole (PROTONIX) 40 MG tablet Take 1 tablet (40 mg total) by mouth 2 (two) times daily before a meal.  60 tablet  0  . predniSONE (DELTASONE) 10 MG tablet 4 tabs PO daily x 3 days then 3 tabs PO daily x 3 days then 2 tabs PO daily x 3 days then 1 tab PO daily x 3 days then STOP  30 tablet  0  . tiotropium (SPIRIVA) 18 MCG inhalation capsule Place 18 mcg into inhaler and inhale daily.      Pauline Aus HFA 45 MCG/ACT inhaler INHALE 1 TO 2 PUFFS EVERY 4 HOURS AS NEEDED  1 Inhaler  6  . albuterol (PROVENTIL) (2.5 MG/3ML) 0.083% nebulizer solution Take 3 mLs (2.5 mg total) by nebulization every 6 (six) hours as needed for wheezing. Dx 496,  FILE MCR PART B  360 mL  5  . omeprazole (PRILOSEC) 20 MG capsule Take 1 capsule (20 mg total) by mouth 2 (two) times daily.  60 capsule  1  . DISCONTD: cefUROXime (CEFTIN) 500 MG tablet Take 500 mg by mouth 2 (two) times daily.      [name] DISCONTD: ipratropium (ATROVENT) 0.02 % nebulizer solution Take 2.5 mLs (0.5 mg total) by nebulization every 6 (six) hours.  300 mL  0  . DISCONTD: isosorbide mononitrate (IMDUR) 30 MG 24 hr tablet Take 1 tablet (30 mg total) by mouth daily.  30 tablet  6  . DISCONTD: KLOR-CON M20 20 MEQ tablet TAKE ONE TABLET BY MOUTH DAILY  30 tablet  5

## 2011-02-09 ENCOUNTER — Telehealth: Payer: Self-pay | Admitting: Critical Care Medicine

## 2011-02-09 NOTE — Telephone Encounter (Signed)
I spoke with Okey Regal from [place] and she states that pt's nebulizer medications are not cheaper than getting it from local pharmacy. She states bc they can't bill to medicaid the budesonide alone will cost $131 and brovana will be well over $200. Looks like pt saw TP and this is why it was being sent to dme company to see if it was cheaper. Per libby pt is going to have to get this filled through a local pharmacy. atc pt but phone line keeps  Ringing na to leave VM [facility]

## 2011-02-09 NOTE — Telephone Encounter (Signed)
[id number] x1 for [name] 

## 2011-02-12 NOTE — Telephone Encounter (Signed)
I called the pt and I was told by family member that he went to the pharmacy to pick up his medication. Carron Curie, CMA

## 2011-02-17 ENCOUNTER — Other Ambulatory Visit: Payer: Self-pay | Admitting: Critical Care Medicine

## 2011-03-21 ENCOUNTER — Ambulatory Visit (INDEPENDENT_AMBULATORY_CARE_PROVIDER_SITE_OTHER): Payer: Medicare HMO | Admitting: Critical Care Medicine

## 2011-03-21 ENCOUNTER — Encounter: Payer: Self-pay | Admitting: Critical Care Medicine

## 2011-03-21 VITALS — BP 118/84 | HR 80 | Temp 97.5°F | Ht 60.0 in | Wt 130.0 lb

## 2011-03-21 DIAGNOSIS — J449 Chronic obstructive pulmonary disease, unspecified: Secondary | ICD-10-CM

## 2011-03-21 MED ORDER — ALBUTEROL SULFATE HFA 108 (90 BASE) MCG/ACT IN AERS: 2.0000 | INHALATION_SPRAY | RESPIRATORY_TRACT | Status: DC | PRN

## 2011-03-21 MED ORDER — ALBUTEROL SULFATE (2.5 MG/3ML) 0.083% IN NEBU: 2.5000 mg | INHALATION_SOLUTION | Freq: Three times a day (TID) | RESPIRATORY_TRACT | Status: DC

## 2011-03-21 NOTE — Progress Notes (Signed)
Subjective:    Patient ID: [name], male    DOB: 05-17-1945, 66 y.o.   MRN: [id number]  HPI   66 y.o.   Asian male patient with a known history of chronic obstructive pulmonary disease with FEV1 of 63% associated CAD non obstructed with recent Non STEMI 6/11. Hyperlipidemia and HTN   01/17/2011  Pt in hospital in 12/12 for exacerbation. Pt still with chest tightness and cough and is minimal.  Cough is productive of food material Cough is worse in the AM. >rx Avelox and steroid taper    02/07/11  Follow up  Pt returns for follow up . 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  He feels good now, back to baselline. No increased SABA use.  He is confused with his meds today. Wants to take pulmicort and brovana but too expensive thru pharmacy. Also has 3 PPI on list. He does not know which is covered by insurance. Has bottle of prilosec w/ recent refill. Will contact pharmacy and update MAR.  No fever or chest pain . No edema.   3/13  Not able to pay for neb meds.   Notes some cough with white mucus. No real heart burn.  Dyspnea is the same. Pt denies any significant sore throat, nasal congestion or excess secretions, fever, chills, sweats, unintended weight loss, pleurtic or exertional chest pain, orthopnea PND, or leg swelling Pt denies any increase in rescue therapy over baseline, denies waking up needing it or having any early am or nocturnal exacerbations of coughing/wheezing/or dyspnea. Pt also denies any obvious fluctuation in symptoms with  weather or environmental change or other alleviating or aggravating factors    Past Medical History  Diagnosis Date  . COPD (chronic obstructive pulmonary disease)     former smoker  . GERD (gastroesophageal reflux disease)   . NSTEMI (non-ST elevated myocardial infarction) 6/11    thought due to vasospasm . cath - CAD LAD 40-50%, LCX 40%, RCA 25% EF 65%. lexiscan myoview EF 68% w/o evidence of ischemia or infarct  . HTN  (hypertension)   . Asthma      No family history on file.   History   Social History  . Marital Status: Married    Spouse Name: N/A    Number of Children: N/A  . Years of Education: N/A   Occupational History  . Not on file.   Social History Main Topics  . Smoking status: Former Smoker -- 50 years    Types: Cigarettes    Quit date: 01/09/2008  . Smokeless tobacco: Never Used   Comment: rolled own cigarettes since age of 39,   . Alcohol Use: Not on file     occassional   . Drug Use: Not on file  . Sexually Active: Not on file     pt sts he only smoke 1 cigarette, not every day   Other Topics Concern  . Not on file   Social History Narrative   Married, 4 children.Disability secondary to lung disease.      No Known Allergies   Outpatient Prescriptions Prior to Visit  Medication Sig Dispense Refill  . amLODipine (NORVASC) 5 MG tablet TAKE 1 TABLET EVERY DAY  30 tablet  6  . aspirin 81 MG tablet Take 81 mg by mouth daily.       . beclomethasone (QVAR) 80 MCG/ACT inhaler Inhale 2 puffs into the lungs 2 (two) times daily.      . isosorbide mononitrate (  IMDUR) 30 MG 24 hr tablet Take 15-30 mg by mouth daily.       [name] SPIRIVA HANDIHALER 18 MCG inhalation capsule INHALE 1 CAPSULE ONCE DAILY  30 each  6  . albuterol (PROVENTIL) (2.5 MG/3ML) 0.083% nebulizer solution Take 3 mLs (2.5 mg total) by nebulization every 6 (six) hours as needed for wheezing. Dx 496,  FILE MCR PART B  360 mL  5  . arformoterol (BROVANA) 15 MCG/2ML NEBU Take 2 mLs (15 mcg total) by nebulization 2 (two) times daily. Dx code: 496  120 mL  11  . omeprazole (PRILOSEC) 20 MG capsule Take 1 capsule (20 mg total) by mouth 2 (two) times daily.  60 capsule  1  . predniSONE (DELTASONE) 10 MG tablet 4 tabs PO daily x 3 days then 3 tabs PO daily x 3 days then 2 tabs PO daily x 3 days then 1 tab PO daily x 3 days then STOP  30 tablet  0  . tiotropium (SPIRIVA) 18 MCG inhalation capsule Place 18 mcg into inhaler and  inhale daily.      . budesonide (PULMICORT) 0.5 MG/2ML nebulizer solution Take 2 mLs (0.5 mg total) by nebulization 2 (two) times daily. DX:  496  120 mL  11  . esomeprazole (NEXIUM) 40 MG capsule Take 1 capsule (40 mg total) by mouth daily.  30 capsule  6  . pantoprazole (PROTONIX) 40 MG tablet Take 1 tablet (40 mg total) by mouth 2 (two) times daily before a meal.  60 tablet  0  . XOPENEX HFA 45 MCG/ACT inhaler INHALE 1 TO 2 PUFFS EVERY 4 HOURS AS NEEDED  1 Inhaler  6      Review of Systems  Constitutional:   No  weight loss, night sweats,  Fevers, chills, + fatigue, lassitude. HEENT:   No headaches,  Difficulty swallowing,  Tooth/dental problems,  Sore throat,                No sneezing, itching, ear ache, nasal congestion, post nasal drip,   CV:  No chest pain,  Orthopnea, PND, swelling in lower extremities, anasarca, dizziness, palpitations  GI  No heartburn, indigestion, abdominal pain, nausea, vomiting, diarrhea, change in bowel habits, loss of appetite  Resp:  .  No excess mucus, no productive cough,  No non-productive cough,  No coughing up of blood.  No change in color of mucus.  No wheezing.  No chest wall deformity  Skin: no rash or lesions.  GU: no dysuria, change in color of urine, no urgency or frequency.  No flank pain.  MS:  No joint pain or swelling.  No decreased range of motion.  No back pain.  Psych:  No change in mood or affect. No depression or anxiety.  No memory loss.     Objective:   Physical Exam   Filed Vitals:   03/21/11 0913  BP: 118/84  Pulse: 80  Temp: 97.5 F (36.4 C)  TempSrc: Oral  Height: 5' (1.524 m)  Weight: 130 lb (58.968 kg)  SpO2: 97%    Gen: Pleasant, well-nourished, in no distress,  normal affect   ENT: No lesions,  mouth clear,  oropharynx clear, no postnasal drip  Neck: No JVD, no TMG, no carotid bruits  Lungs: No use of accessory muscles, no dullness to percussion, distant BS, no wheezing  Cardiovascular: RRR, heart  sounds normal, no murmur or gallops, no peripheral edema  Abdomen: soft and NT, no HSM,  BS normal  Musculoskeletal: No deformities, no cyanosis or clubbing  Neuro: alert, non focal  Skin: Warm, no lesions or rashes   8/28  CXR IMPRESSION: There is no evidence of acute cardiac or pulmonary process.       Assessment & Plan:    Updated Medication List Outpatient Encounter Prescriptions as of 03/21/2011  Medication Sig Dispense Refill  . albuterol (PROVENTIL) (2.5 MG/3ML) 0.083% nebulizer solution Take 3 mLs (2.5 mg total) by nebulization 3 (three) times daily. And as needed. Dx 496,  FILE MCR PART B  360 mL  5  . amLODipine (NORVASC) 5 MG tablet TAKE 1 TABLET EVERY DAY  30 tablet  6  . aspirin 81 MG tablet Take 81 mg by mouth daily.       . beclomethasone (QVAR) 80 MCG/ACT inhaler Inhale 2 puffs into the lungs 2 (two) times daily.      . isosorbide mononitrate (IMDUR) 30 MG 24 hr tablet Take 15-30 mg by mouth daily.       [name] omeprazole (PRILOSEC) 20 MG capsule Take 20 mg by mouth daily.      . potassium chloride SA (K-DUR,KLOR-CON) 20 MEQ tablet Take 20 mEq by mouth daily.      . predniSONE (DELTASONE) 10 MG tablet Take 10 mg by mouth daily.      [name] SPIRIVA HANDIHALER 18 MCG inhalation capsule INHALE 1 CAPSULE ONCE DAILY  30 each  6  . DISCONTD: albuterol (PROVENTIL) (2.5 MG/3ML) 0.083% nebulizer solution Take 3 mLs (2.5 mg total) by nebulization every 6 (six) hours as needed for wheezing. Dx 496,  FILE MCR PART B  360 mL  5  . DISCONTD: arformoterol (BROVANA) 15 MCG/2ML NEBU Take 2 mLs (15 mcg total) by nebulization 2 (two) times daily. Dx code: 496  120 mL  11  . DISCONTD: omeprazole (PRILOSEC) 20 MG capsule Take 1 capsule (20 mg total) by mouth 2 (two) times daily.  60 capsule  1  . DISCONTD: predniSONE (DELTASONE) 10 MG tablet 4 tabs PO daily x 3 days then 3 tabs PO daily x 3 days then 2 tabs PO daily x 3 days then 1 tab PO daily x 3 days then STOP  30 tablet  0  . DISCONTD:  tiotropium (SPIRIVA) 18 MCG inhalation capsule Place 18 mcg into inhaler and inhale daily.      [name] albuterol (PROVENTIL HFA;VENTOLIN HFA) 108 (90 BASE) MCG/ACT inhaler Inhale 2 puffs into the lungs every 4 (four) hours as needed for wheezing.  1 Inhaler  2  . DISCONTD: budesonide (PULMICORT) 0.5 MG/2ML nebulizer solution Take 2 mLs (0.5 mg total) by nebulization 2 (two) times daily. DX:  496  120 mL  11  . DISCONTD: esomeprazole (NEXIUM) 40 MG capsule Take 1 capsule (40 mg total) by mouth daily.  30 capsule  6  . DISCONTD: pantoprazole (PROTONIX) 40 MG tablet Take 1 tablet (40 mg total) by mouth 2 (two) times daily before a meal.  60 tablet  0  . DISCONTD: XOPENEX HFA 45 MCG/ACT inhaler INHALE 1 TO 2 PUFFS EVERY 4 HOURS AS NEEDED  1 Inhaler  6

## 2011-03-21 NOTE — Patient Instructions (Signed)
Stop brovana and budesonide and xopenex Stay on Qvar Stay on prednisone Start proventil/ventolin as needed Start albuterol in nebulizer three times daily Return 3 months

## 2011-03-22 NOTE — Assessment & Plan Note (Signed)
Copd Golds C Cannot afford inhaled neb meds Plan Stop brovana and budesonide and xopenex Stay on Qvar Stay on prednisone Start proventil/ventolin as needed Start albuterol in nebulizer three times daily Return 3 months

## 2011-05-02 ENCOUNTER — Other Ambulatory Visit: Payer: Self-pay | Admitting: *Deleted

## 2011-05-02 MED ORDER — POTASSIUM CHLORIDE CRYS ER 20 MEQ PO TBCR: 20.0000 meq | EXTENDED_RELEASE_TABLET | Freq: Every day | ORAL | Status: DC

## 2011-06-05 ENCOUNTER — Other Ambulatory Visit: Payer: Self-pay | Admitting: Critical Care Medicine

## 2011-07-11 ENCOUNTER — Encounter: Payer: Self-pay | Admitting: Critical Care Medicine

## 2011-07-11 ENCOUNTER — Ambulatory Visit (INDEPENDENT_AMBULATORY_CARE_PROVIDER_SITE_OTHER): Payer: Medicare HMO | Admitting: Critical Care Medicine

## 2011-07-11 VITALS — BP 138/88 | HR 91 | Temp 98.6°F | Ht 60.0 in | Wt 127.0 lb

## 2011-07-11 DIAGNOSIS — J449 Chronic obstructive pulmonary disease, unspecified: Secondary | ICD-10-CM

## 2011-07-11 NOTE — Progress Notes (Signed)
Subjective:    Patient ID: [name], male    DOB: 1945/03/15, 66 y.o.   MRN: [id number]  HPI   66 y.o.   Asian male patient with a known history of chronic obstructive pulmonary disease with FEV1 of 63% associated CAD non obstructed with recent Non STEMI 6/11. Hyperlipidemia and HTN    07/11/2011 CAT11.  No real change. Min cough.  Dyspnea sl better, worse if works hard, cuts grass. Pt denies any significant sore throat, nasal congestion or excess secretions, fever, chills, sweats, unintended weight loss, pleurtic or exertional chest pain, orthopnea PND, or leg swelling Pt denies any increase in rescue therapy over baseline, denies waking up needing it or having any early am or nocturnal exacerbations of coughing/wheezing/or dyspnea. Pt also denies any obvious fluctuation in symptoms with  weather or environmental change or other alleviating or aggravating factors    Past Medical History  Diagnosis Date  . COPD (chronic obstructive pulmonary disease)     former smoker  . GERD (gastroesophageal reflux disease)   . NSTEMI (non-ST elevated myocardial infarction) 6/11    thought due to vasospasm . cath - CAD LAD 40-50%, LCX 40%, RCA 25% EF 65%. lexiscan myoview EF 68% w/o evidence of ischemia or infarct  . HTN (hypertension)   . Asthma      No family history on file.   History   Social History  . Marital Status: Married    Spouse Name: N/A    Number of Children: N/A  . Years of Education: N/A   Occupational History  . Not on file.   Social History Main Topics  . Smoking status: Former Smoker -- 50 years    Types: Cigarettes    Quit date: 01/09/2008  . Smokeless tobacco: Never Used   Comment: rolled own cigarettes since age of 39,   . Alcohol Use: Not on file     occassional   . Drug Use: Not on file  . Sexually Active: Not on file     pt sts he only smoke 1 cigarette, not every day   Other Topics Concern  . Not on file   Social History Narrative   Married, 4  children.Disability secondary to lung disease.      No Known Allergies   Outpatient Prescriptions Prior to Visit  Medication Sig Dispense Refill  . albuterol (PROVENTIL) (2.5 MG/3ML) 0.083% nebulizer solution Take 3 mLs (2.5 mg total) by nebulization 3 (three) times daily. And as needed. Dx 496,  FILE MCR PART B  360 mL  5  . amLODipine (NORVASC) 5 MG tablet TAKE 1 TABLET EVERY DAY  30 tablet  6  . aspirin 81 MG tablet Take 81 mg by mouth daily.       [name] omeprazole (PRILOSEC) 20 MG capsule Take 20 mg by mouth daily.      . predniSONE (DELTASONE) 10 MG tablet Take 10 mg by mouth daily.      [name] SPIRIVA HANDIHALER 18 MCG inhalation capsule INHALE 1 CAPSULE ONCE DAILY  30 each  6  . QVAR 80 MCG/ACT inhaler USE THREE PUFFS TWICE DAILY  8.7 g  2  . albuterol (PROVENTIL HFA;VENTOLIN HFA) 108 (90 BASE) MCG/ACT inhaler Inhale 2 puffs into the lungs every 4 (four) hours as needed for wheezing.  1 Inhaler  2  . beclomethasone (QVAR) 80 MCG/ACT inhaler Inhale 2 puffs into the lungs 2 (two) times daily.      . isosorbide mononitrate (IMDUR) 30 MG 24 hr  tablet Take 15-30 mg by mouth daily.       . potassium chloride SA (K-DUR,KLOR-CON) 20 MEQ tablet Take 1 tablet (20 mEq total) by mouth daily.  30 tablet  2      Review of Systems  Constitutional:   No  weight loss, night sweats,  Fevers, chills, + fatigue, lassitude. HEENT:   No headaches,  Difficulty swallowing,  Tooth/dental problems,  Sore throat,                No sneezing, itching, ear ache, nasal congestion, post nasal drip,   CV:  No chest pain,  Orthopnea, PND, swelling in lower extremities, anasarca, dizziness, palpitations  GI  No heartburn, indigestion, abdominal pain, nausea, vomiting, diarrhea, change in bowel habits, loss of appetite  Resp:  .  No excess mucus, no productive cough,  No non-productive cough,  No coughing up of blood.  No change in color of mucus.  No wheezing.  No chest wall deformity  Skin: no rash or lesions.  GU:  no dysuria, change in color of urine, no urgency or frequency.  No flank pain.  MS:  No joint pain or swelling.  No decreased range of motion.  No back pain.  Psych:  No change in mood or affect. No depression or anxiety.  No memory loss.     Objective:   Physical Exam   Filed Vitals:   07/11/11 1450  BP: 138/88  Pulse: 91  Temp: 98.6 F (37 C)  TempSrc: Oral  Height: 5' (1.524 m)  Weight: 57.607 kg (127 lb)  SpO2: 94%    Gen: Pleasant, well-nourished, in no distress,  normal affect   ENT: No lesions,  mouth clear,  oropharynx clear, no postnasal drip  Neck: No JVD, no TMG, no carotid bruits  Lungs: No use of accessory muscles, no dullness to percussion, distant BS, no wheezing  Cardiovascular: RRR, heart sounds normal, no murmur or gallops, no peripheral edema  Abdomen: soft and NT, no HSM,  BS normal  Musculoskeletal: No deformities, no cyanosis or clubbing  Neuro: alert, non focal  Skin: Warm, no lesions or rashes   8/28  CXR IMPRESSION: There is no evidence of acute cardiac or pulmonary process.       Assessment & Plan:   COPD Copd Golds C, stable if can take /get Inhaled meds Plan No change in inhaled or maintenance medications. Return in  4  mo   Updated Medication List Outpatient Encounter Prescriptions as of 07/11/2011  Medication Sig Dispense Refill  . albuterol (PROVENTIL) (2.5 MG/3ML) 0.083% nebulizer solution Take 3 mLs (2.5 mg total) by nebulization 3 (three) times daily. And as needed. Dx 496,  FILE MCR PART B  360 mL  5  . amLODipine (NORVASC) 5 MG tablet TAKE 1 TABLET EVERY DAY  30 tablet  6  . aspirin 81 MG tablet Take 81 mg by mouth daily.       . beclomethasone (QVAR) 80 MCG/ACT inhaler 2 puffs 2 (two) times daily.       [name] omeprazole (PRILOSEC) 20 MG capsule Take 20 mg by mouth daily.      . predniSONE (DELTASONE) 10 MG tablet Take 10 mg by mouth daily.      [name] SPIRIVA HANDIHALER 18 MCG inhalation capsule INHALE 1 CAPSULE ONCE DAILY   30 each  6  . DISCONTD: QVAR 80 MCG/ACT inhaler USE THREE PUFFS TWICE DAILY  8.7 g  2  . albuterol (PROVENTIL HFA;VENTOLIN HFA) 108 (  90 BASE) MCG/ACT inhaler Inhale 2 puffs into the lungs every 4 (four) hours as needed for wheezing.  1 Inhaler  2  . DISCONTD: beclomethasone (QVAR) 80 MCG/ACT inhaler Inhale 2 puffs into the lungs 2 (two) times daily.      [name] DISCONTD: isosorbide mononitrate (IMDUR) 30 MG 24 hr tablet Take 15-30 mg by mouth daily.       [name] DISCONTD: potassium chloride SA (K-DUR,KLOR-CON) 20 MEQ tablet Take 1 tablet (20 mEq total) by mouth daily.  30 tablet  2

## 2011-07-11 NOTE — Patient Instructions (Addendum)
No change in medications. Return in         4 months 

## 2011-07-12 NOTE — Assessment & Plan Note (Addendum)
Copd Golds C, stable if can take /get Inhaled meds Plan No change in inhaled or maintenance medications. Return in  4  mo

## 2011-08-03 ENCOUNTER — Other Ambulatory Visit: Payer: Self-pay | Admitting: Critical Care Medicine

## 2011-08-29 ENCOUNTER — Other Ambulatory Visit: Payer: Self-pay | Admitting: Critical Care Medicine

## 2011-09-21 ENCOUNTER — Other Ambulatory Visit: Payer: Self-pay | Admitting: Critical Care Medicine

## 2011-10-20 ENCOUNTER — Other Ambulatory Visit: Payer: Self-pay | Admitting: Critical Care Medicine

## 2011-10-23 NOTE — Telephone Encounter (Signed)
Isosorbide not on pt's current med list.  Will need to contact office.

## 2011-11-13 ENCOUNTER — Other Ambulatory Visit: Payer: Self-pay | Admitting: Critical Care Medicine

## 2011-11-24 ENCOUNTER — Other Ambulatory Visit: Payer: Self-pay | Admitting: Critical Care Medicine

## 2011-11-26 MED ORDER — PREDNISONE 10 MG PO TABS: 10.0000 mg | ORAL_TABLET | Freq: Every day | ORAL | Status: DC

## 2011-12-11 ENCOUNTER — Ambulatory Visit (INDEPENDENT_AMBULATORY_CARE_PROVIDER_SITE_OTHER): Payer: Medicare HMO | Admitting: Critical Care Medicine

## 2011-12-11 ENCOUNTER — Encounter: Payer: Self-pay | Admitting: Critical Care Medicine

## 2011-12-11 VITALS — BP 120/80 | HR 77 | Temp 98.0°F | Ht 59.0 in | Wt 130.8 lb

## 2011-12-11 DIAGNOSIS — J4489 Other specified chronic obstructive pulmonary disease: Secondary | ICD-10-CM

## 2011-12-11 DIAGNOSIS — J209 Acute bronchitis, unspecified: Secondary | ICD-10-CM

## 2011-12-11 DIAGNOSIS — J449 Chronic obstructive pulmonary disease, unspecified: Secondary | ICD-10-CM

## 2011-12-11 MED ORDER — AZITHROMYCIN 250 MG PO TABS: 250.0000 mg | ORAL_TABLET | Freq: Every day | ORAL | Status: DC

## 2011-12-11 MED ORDER — PREDNISONE 10 MG PO TABS: ORAL_TABLET | ORAL | Status: DC

## 2011-12-11 NOTE — Patient Instructions (Signed)
Azithromycin 250mg  Take two once then one daily until gone Prednisone 10mg  Take 4 for three days 3 for three days 2 for three days then one daily and stay No other medication changes Return 4 months

## 2011-12-11 NOTE — Progress Notes (Signed)
Subjective:    Patient ID: [name], male    DOB: 02/22/1945, 66 y.o.   MRN: [id number]  HPI   66 y.o.   Asian male patient with a known history of chronic obstructive pulmonary disease with FEV1 of 63% associated CAD non obstructed with recent Non STEMI 6/11. Hyperlipidemia and HTN    07/11/2011 CAT11.  No real change. Min cough.  Dyspnea sl better, worse if works hard, cuts grass. Pt denies any significant sore throat, nasal congestion or excess secretions, fever, chills, sweats, unintended weight loss, pleurtic or exertional chest pain, orthopnea PND, or leg swelling Pt denies any increase in rescue therapy over baseline, denies waking up needing it or having any early am or nocturnal exacerbations of coughing/wheezing/or dyspnea. Pt also denies any obvious fluctuation in symptoms with  weather or environmental change or other alleviating or aggravating factors  12/11/2011 Pt notes more dyspnea over past few months. Having to use albuterol several times a day Notes mucus is white.  No chest pain  No f/c/s.  No GERD symptoms Notes more night symptoms     Past Medical History  Diagnosis Date  . COPD (chronic obstructive pulmonary disease)     former smoker  . GERD (gastroesophageal reflux disease)   . NSTEMI (non-ST elevated myocardial infarction) 6/11    thought due to vasospasm . cath - CAD LAD 40-50%, LCX 40%, RCA 25% EF 65%. lexiscan myoview EF 68% w/o evidence of ischemia or infarct  . HTN (hypertension)   . Asthma      No family history on file.   History   Social History  . Marital Status: Married    Spouse Name: N/A    Number of Children: N/A  . Years of Education: N/A   Occupational History  . Not on file.   Social History Main Topics  . Smoking status: Former Smoker -- 1.0 packs/day for 50 years    Types: Cigarettes    Quit date: 01/09/2008  . Smokeless tobacco: Never Used     Comment: rolled own cigarettes since age of 9,   . Alcohol Use: Not on file      Comment: occassional   . Drug Use: Not on file  . Sexually Active: Not on file     Comment: pt sts he only smoke 1 cigarette, not every day   Other Topics Concern  . Not on file   Social History Narrative   Married, 4 children.Disability secondary to lung disease.      No Known Allergies   Outpatient Prescriptions Prior to Visit  Medication Sig Dispense Refill  . aspirin 81 MG tablet Take 81 mg by mouth daily as needed.       . beclomethasone (QVAR) 80 MCG/ACT inhaler Inhale 2 puffs into the lungs 2 (two) times daily.  1 Inhaler  4  . omeprazole (PRILOSEC) 20 MG capsule TAKE ONE CAPSULE BY MOUTH EVERY DAY  30 capsule  1  . PROVENTIL HFA 108 (90 BASE) MCG/ACT inhaler INHALE 2 PUFFS INTO THE LUNGS EVERY 4 (FOUR) HOURS AS NEEDED FOR WHEEZING.  6.7 each  2  . SPIRIVA HANDIHALER 18 MCG inhalation capsule INHALE 1 CAPSULE ONCE DAILY  30 each  6  . [DISCONTINUED] albuterol (PROVENTIL) (2.5 MG/3ML) 0.083% nebulizer solution Take 3 mLs (2.5 mg total) by nebulization 3 (three) times daily. And as needed. Dx 496,  FILE MCR PART B  360 mL  5  . [DISCONTINUED] amLODipine (NORVASC) 5 MG tablet TAKE 1  TABLET EVERY DAY  30 tablet  6  . [DISCONTINUED] beclomethasone (QVAR) 80 MCG/ACT inhaler 2 puffs 2 (two) times daily.       . [DISCONTINUED] omeprazole (PRILOSEC) 20 MG capsule Take 20 mg by mouth daily.      . [DISCONTINUED] predniSONE (DELTASONE) 10 MG tablet Take 1 tablet (10 mg total) by mouth daily.  30 tablet  1  Last reviewed on 12/11/2011 11:19 AM by Storm Frisk, MD    Review of Systems  Constitutional:   No  weight loss, night sweats,  Fevers, chills, + fatigue, lassitude. HEENT:   No headaches,  Difficulty swallowing,  Tooth/dental problems,  Sore throat,                No sneezing, itching, ear ache, nasal congestion, post nasal drip,   CV:  No chest pain,  Orthopnea, PND, swelling in lower extremities, anasarca, dizziness, palpitations  GI  No heartburn, indigestion,  abdominal pain, nausea, vomiting, diarrhea, change in bowel habits, loss of appetite  Resp:  .  No excess mucus, no productive cough,  No non-productive cough,  No coughing up of blood.  No change in color of mucus.  No wheezing.  No chest wall deformity  Skin: no rash or lesions.  GU: no dysuria, change in color of urine, no urgency or frequency.  No flank pain.  MS:  No joint pain or swelling.  No decreased range of motion.  No back pain.  Psych:  No change in mood or affect. No depression or anxiety.  No memory loss.     Objective:   Physical Exam   Filed Vitals:   12/11/11 1056  BP: 120/80  Pulse: 77  Temp: 98 F (36.7 C)  TempSrc: Oral  Height: 4\' 11"  (1.499 m)  Weight: 130 lb 12.8 oz (59.33 kg)  SpO2: 96%    Gen: Pleasant, well-nourished, in no distress,  normal affect   ENT: No lesions,  mouth clear,  oropharynx clear, no postnasal drip  Neck: No JVD, no TMG, no carotid bruits  Lungs: No use of accessory muscles, no dullness to percussion, distant BS, no wheezing  Cardiovascular: RRR, heart sounds normal, no murmur or gallops, no peripheral edema  Abdomen: soft and NT, no HSM,  BS normal  Musculoskeletal: No deformities, no cyanosis or clubbing  Neuro: alert, non focal  Skin: Warm, no lesions or rashes   8/28  CXR IMPRESSION: There is no evidence of acute cardiac or pulmonary process.       Assessment & Plan:   COPD Gold C Chronic obstructive lung disease gold stage C. with exacerbation Plan  Pulse prednisone Azithromycin for 5 days Maintain inhaled medications as prescribed    Updated Medication List Outpatient Encounter Prescriptions as of 12/11/2011  Medication Sig Dispense Refill  . albuterol (PROVENTIL) (2.5 MG/3ML) 0.083% nebulizer solution Take 2.5 mg by nebulization 2 (two) times daily. And as needed. Dx 496,  FILE MCR PART B      . amLODipine (NORVASC) 5 MG tablet       . aspirin 81 MG tablet Take 81 mg by mouth daily as needed.        . beclomethasone (QVAR) 80 MCG/ACT inhaler Inhale 2 puffs into the lungs 2 (two) times daily.  1 Inhaler  4  . omeprazole (PRILOSEC) 20 MG capsule TAKE ONE CAPSULE BY MOUTH EVERY DAY  30 capsule  1  . potassium chloride SA (K-DUR,KLOR-CON) 20 MEQ tablet Take 20 mEq by mouth  daily.      . predniSONE (DELTASONE) 10 MG tablet Take 4 for three days 3 for three days 2 for three days then one daily and stay  60 tablet  6  . PROVENTIL HFA 108 (90 BASE) MCG/ACT inhaler INHALE 2 PUFFS INTO THE LUNGS EVERY 4 (FOUR) HOURS AS NEEDED FOR WHEEZING.  6.7 each  2  . SPIRIVA HANDIHALER 18 MCG inhalation capsule INHALE 1 CAPSULE ONCE DAILY  30 each  6  . [DISCONTINUED] albuterol (PROVENTIL) (2.5 MG/3ML) 0.083% nebulizer solution Take 3 mLs (2.5 mg total) by nebulization 3 (three) times daily. And as needed. Dx 496,  FILE MCR PART B  360 mL  5  . [DISCONTINUED] amLODipine (NORVASC) 5 MG tablet TAKE 1 TABLET EVERY DAY  30 tablet  6  . [DISCONTINUED] beclomethasone (QVAR) 80 MCG/ACT inhaler 2 puffs 2 (two) times daily.       . [DISCONTINUED] omeprazole (PRILOSEC) 20 MG capsule Take 20 mg by mouth daily.      . [DISCONTINUED] predniSONE (DELTASONE) 10 MG tablet Take 1 tablet (10 mg total) by mouth daily.  30 tablet  1  . [DISCONTINUED] predniSONE (DELTASONE) 10 MG tablet Take 20 mg by mouth daily as needed.      [name] azithromycin (ZITHROMAX) 250 MG tablet Take 1 tablet (250 mg total) by mouth daily. Take two once then one daily until gone  6 each  0

## 2011-12-11 NOTE — Assessment & Plan Note (Signed)
Chronic obstructive lung disease gold stage C. with exacerbation Plan  Pulse prednisone Azithromycin for 5 days Maintain inhaled medications as prescribed

## 2011-12-18 ENCOUNTER — Other Ambulatory Visit: Payer: Self-pay | Admitting: Critical Care Medicine

## 2012-01-11 ENCOUNTER — Other Ambulatory Visit: Payer: Self-pay | Admitting: Critical Care Medicine

## 2012-02-15 ENCOUNTER — Ambulatory Visit (HOSPITAL_COMMUNITY): Admit: 2012-02-15 | Payer: Self-pay | Admitting: Cardiology

## 2012-02-15 ENCOUNTER — Other Ambulatory Visit: Payer: Self-pay

## 2012-02-15 ENCOUNTER — Encounter (HOSPITAL_COMMUNITY)
Admission: EM | Disposition: A | Discharge status: Home / Self Care | Payer: Self-pay | Source: Home / Self Care | Attending: Cardiology

## 2012-02-15 ENCOUNTER — Encounter (HOSPITAL_COMMUNITY): Payer: Self-pay | Admitting: *Deleted

## 2012-02-15 ENCOUNTER — Inpatient Hospital Stay (HOSPITAL_COMMUNITY)
Admission: EM | Admit: 2012-02-15 | Discharge: 2012-02-17 | DRG: 249 | Disposition: A | Discharge status: Home / Self Care | Payer: Medicare HMO | Attending: Cardiology | Admitting: Cardiology

## 2012-02-15 DIAGNOSIS — J309 Allergic rhinitis, unspecified: Secondary | ICD-10-CM

## 2012-02-15 DIAGNOSIS — I219 Acute myocardial infarction, unspecified: Secondary | ICD-10-CM

## 2012-02-15 DIAGNOSIS — Z720 Tobacco use: Secondary | ICD-10-CM

## 2012-02-15 DIAGNOSIS — I2510 Atherosclerotic heart disease of native coronary artery without angina pectoris: Secondary | ICD-10-CM

## 2012-02-15 DIAGNOSIS — Z955 Presence of coronary angioplasty implant and graft: Secondary | ICD-10-CM

## 2012-02-15 DIAGNOSIS — Z79899 Other long term (current) drug therapy: Secondary | ICD-10-CM

## 2012-02-15 DIAGNOSIS — K219 Gastro-esophageal reflux disease without esophagitis: Secondary | ICD-10-CM | POA: Diagnosis present

## 2012-02-15 DIAGNOSIS — J4489 Other specified chronic obstructive pulmonary disease: Secondary | ICD-10-CM | POA: Diagnosis present

## 2012-02-15 DIAGNOSIS — I10 Essential (primary) hypertension: Secondary | ICD-10-CM

## 2012-02-15 DIAGNOSIS — Z7902 Long term (current) use of antithrombotics/antiplatelets: Secondary | ICD-10-CM

## 2012-02-15 DIAGNOSIS — E1169 Type 2 diabetes mellitus with other specified complication: Secondary | ICD-10-CM

## 2012-02-15 DIAGNOSIS — I2119 ST elevation (STEMI) myocardial infarction involving other coronary artery of inferior wall: Principal | ICD-10-CM

## 2012-02-15 DIAGNOSIS — J449 Chronic obstructive pulmonary disease, unspecified: Secondary | ICD-10-CM | POA: Diagnosis present

## 2012-02-15 DIAGNOSIS — Z87891 Personal history of nicotine dependence: Secondary | ICD-10-CM

## 2012-02-15 DIAGNOSIS — I213 ST elevation (STEMI) myocardial infarction of unspecified site: Secondary | ICD-10-CM

## 2012-02-15 DIAGNOSIS — J441 Chronic obstructive pulmonary disease with (acute) exacerbation: Secondary | ICD-10-CM | POA: Diagnosis present

## 2012-02-15 DIAGNOSIS — I252 Old myocardial infarction: Secondary | ICD-10-CM

## 2012-02-15 DIAGNOSIS — Z7982 Long term (current) use of aspirin: Secondary | ICD-10-CM

## 2012-02-15 DIAGNOSIS — E785 Hyperlipidemia, unspecified: Secondary | ICD-10-CM

## 2012-02-15 HISTORY — DX: Hyperlipidemia, unspecified: E78.5

## 2012-02-15 HISTORY — DX: Allergic rhinitis, unspecified: J30.9

## 2012-02-15 HISTORY — PX: LEFT HEART CATHETERIZATION WITH CORONARY ANGIOGRAM: SHX5451

## 2012-02-15 HISTORY — DX: Old myocardial infarction: I25.2

## 2012-02-15 HISTORY — PX: PERCUTANEOUS CORONARY STENT INTERVENTION (PCI-S): SHX5485

## 2012-02-15 LAB — CBC
HCT: 44.9 % (ref 39.0–52.0)
MCH: 31.7 pg (ref 26.0–34.0)
MCHC: 35.3 g/dL (ref 30.0–36.0)
MCV: 90.7 fL (ref 78.0–100.0)
Platelets: 288 10*3/uL (ref 150–400)
RBC: 5.08 MIL/uL (ref 4.22–5.81)
RBC: 4.95 MIL/uL (ref 4.22–5.81)
WBC: 8.7 10*3/uL (ref 4.0–10.5)

## 2012-02-15 LAB — POCT I-STAT TROPONIN I

## 2012-02-15 LAB — BASIC METABOLIC PANEL
BUN: 16 mg/dL (ref 6–23)
CO2: 27 mEq/L (ref 19–32)
Chloride: 104 mEq/L (ref 96–112)
Creatinine, Ser: 0.85 mg/dL (ref 0.50–1.35)
Glucose, Bld: 102 mg/dL — ABNORMAL HIGH (ref 70–99)

## 2012-02-15 LAB — POCT I-STAT, CHEM 8
BUN: 20 mg/dL (ref 6–23)
Chloride: 106 mEq/L (ref 96–112)
Creatinine, Ser: 1 mg/dL (ref 0.50–1.35)
Potassium: 3.9 mEq/L (ref 3.5–5.1)
Sodium: 141 mEq/L (ref 135–145)

## 2012-02-15 LAB — COMPREHENSIVE METABOLIC PANEL
ALT: 17 U/L (ref 0–53)
AST: 18 U/L (ref 0–37)
Albumin: 3.4 g/dL — ABNORMAL LOW (ref 3.5–5.2)
CO2: 25 mEq/L (ref 19–32)
Calcium: 8.6 mg/dL (ref 8.4–10.5)
Sodium: 139 mEq/L (ref 135–145)
Total Protein: 6.3 g/dL (ref 6.0–8.3)

## 2012-02-15 LAB — POCT ACTIVATED CLOTTING TIME: Activated Clotting Time: 442 seconds

## 2012-02-15 SURGERY — LEFT HEART CATHETERIZATION WITH CORONARY ANGIOGRAM
Anesthesia: LOCAL

## 2012-02-15 MED ORDER — ONDANSETRON HCL 4 MG/2ML IJ SOLN
INTRAMUSCULAR | Status: AC
  Administered 2012-02-15: 4 mg via INTRAVENOUS
  Filled 2012-02-15: qty 2

## 2012-02-15 MED ORDER — DIPHENHYDRAMINE HCL 25 MG PO CAPS
25.0000 mg | ORAL_CAPSULE | Freq: Once | ORAL | Status: AC
  Administered 2012-02-15: 25 mg via ORAL
  Filled 2012-02-15: qty 1

## 2012-02-15 MED ORDER — NITROGLYCERIN 1 MG/10 ML FOR IR/CATH LAB
INTRA_ARTERIAL | Status: AC
  Filled 2012-02-15: qty 10

## 2012-02-15 MED ORDER — HEPARIN (PORCINE) IN NACL 2-0.9 UNIT/ML-% IJ SOLN
INTRAMUSCULAR | Status: AC
  Filled 2012-02-15: qty 1000

## 2012-02-15 MED ORDER — ALBUTEROL SULFATE HFA 108 (90 BASE) MCG/ACT IN AERS: 2.0000 | INHALATION_SPRAY | Freq: Four times a day (QID) | RESPIRATORY_TRACT | Status: DC | PRN

## 2012-02-15 MED ORDER — ASPIRIN EC 81 MG PO TBEC
81.0000 mg | DELAYED_RELEASE_TABLET | Freq: Every day | ORAL | Status: DC
  Administered 2012-02-15 – 2012-02-17 (×3): 81 mg via ORAL
  Filled 2012-02-15 (×3): qty 1

## 2012-02-15 MED ORDER — HEPARIN SODIUM (PORCINE) 5000 UNIT/ML IJ SOLN: 60.0000 [IU]/kg | INTRAMUSCULAR | Status: DC

## 2012-02-15 MED ORDER — MORPHINE SULFATE 2 MG/ML IJ SOLN: 2.0000 mg | INTRAMUSCULAR | Status: DC | PRN

## 2012-02-15 MED ORDER — LIDOCAINE HCL (PF) 1 % IJ SOLN
INTRAMUSCULAR | Status: AC
  Filled 2012-02-15: qty 30

## 2012-02-15 MED ORDER — TIOTROPIUM BROMIDE MONOHYDRATE 18 MCG IN CAPS
18.0000 ug | ORAL_CAPSULE | Freq: Every day | RESPIRATORY_TRACT | Status: DC
  Administered 2012-02-16 – 2012-02-17 (×2): 18 ug via RESPIRATORY_TRACT
  Filled 2012-02-15 (×2): qty 5

## 2012-02-15 MED ORDER — HEPARIN SODIUM (PORCINE) 5000 UNIT/ML IJ SOLN
60.0000 [IU]/kg | Freq: Once | INTRAMUSCULAR | Status: DC
  Administered 2012-02-15: 4000 [IU] via INTRAVENOUS

## 2012-02-15 MED ORDER — LISINOPRIL 5 MG PO TABS
5.0000 mg | ORAL_TABLET | Freq: Every day | ORAL | Status: DC
  Administered 2012-02-15 – 2012-02-17 (×3): 5 mg via ORAL
  Filled 2012-02-15 (×3): qty 1

## 2012-02-15 MED ORDER — SODIUM CHLORIDE 0.9 % IV SOLN: INTRAVENOUS | Status: AC

## 2012-02-15 MED ORDER — FENTANYL CITRATE 0.05 MG/ML IJ SOLN
INTRAMUSCULAR | Status: AC
  Filled 2012-02-15: qty 2

## 2012-02-15 MED ORDER — ATORVASTATIN CALCIUM 80 MG PO TABS
80.0000 mg | ORAL_TABLET | Freq: Every day | ORAL | Status: DC
  Administered 2012-02-15 – 2012-02-16 (×2): 80 mg via ORAL
  Filled 2012-02-15 (×3): qty 1

## 2012-02-15 MED ORDER — MORPHINE SULFATE 2 MG/ML IJ SOLN
INTRAMUSCULAR | Status: AC
  Administered 2012-02-15: 2 mg via INTRAVENOUS
  Filled 2012-02-15: qty 1

## 2012-02-15 MED ORDER — FLUTICASONE PROPIONATE HFA 44 MCG/ACT IN AERO
2.0000 | INHALATION_SPRAY | Freq: Two times a day (BID) | RESPIRATORY_TRACT | Status: DC
  Administered 2012-02-15 – 2012-02-17 (×4): 2 via RESPIRATORY_TRACT
  Filled 2012-02-15: qty 10.6

## 2012-02-15 MED ORDER — HEPARIN SODIUM (PORCINE) 5000 UNIT/ML IJ SOLN
INTRAMUSCULAR | Status: AC
  Filled 2012-02-15: qty 1

## 2012-02-15 MED ORDER — SODIUM CHLORIDE 0.9 % IV SOLN: 1.0000 mL/kg/h | INTRAVENOUS | Status: AC

## 2012-02-15 MED ORDER — POTASSIUM CHLORIDE CRYS ER 20 MEQ PO TBCR
20.0000 meq | EXTENDED_RELEASE_TABLET | Freq: Every day | ORAL | Status: DC
  Administered 2012-02-15 – 2012-02-17 (×3): 20 meq via ORAL
  Filled 2012-02-15 (×3): qty 1

## 2012-02-15 MED ORDER — ATROPINE SULFATE 1 MG/ML IJ SOLN
INTRAMUSCULAR | Status: AC
  Filled 2012-02-15: qty 1

## 2012-02-15 MED ORDER — CLOPIDOGREL BISULFATE 300 MG PO TABS
ORAL_TABLET | ORAL | Status: AC
  Filled 2012-02-15: qty 2

## 2012-02-15 MED ORDER — PREDNISONE 10 MG PO TABS
10.0000 mg | ORAL_TABLET | Freq: Every day | ORAL | Status: DC
  Administered 2012-02-15 – 2012-02-17 (×3): 10 mg via ORAL
  Filled 2012-02-15 (×4): qty 1

## 2012-02-15 MED ORDER — ONDANSETRON HCL 4 MG/2ML IJ SOLN: 4.0000 mg | Freq: Four times a day (QID) | INTRAMUSCULAR | Status: DC | PRN

## 2012-02-15 MED ORDER — SODIUM CHLORIDE 0.9 % IV SOLN
INTRAVENOUS | Status: DC
  Administered 2012-02-15: 01:00:00 via INTRAVENOUS

## 2012-02-15 MED ORDER — ONDANSETRON HCL 4 MG/2ML IJ SOLN
4.0000 mg | Freq: Once | INTRAMUSCULAR | Status: AC
  Administered 2012-02-15: 4 mg via INTRAVENOUS

## 2012-02-15 MED ORDER — CLOPIDOGREL BISULFATE 75 MG PO TABS
75.0000 mg | ORAL_TABLET | Freq: Every day | ORAL | Status: DC
  Administered 2012-02-15 – 2012-02-17 (×3): 75 mg via ORAL
  Filled 2012-02-15 (×3): qty 1

## 2012-02-15 MED ORDER — LORATADINE 10 MG PO TABS
10.0000 mg | ORAL_TABLET | Freq: Every day | ORAL | Status: DC
  Administered 2012-02-15 – 2012-02-17 (×3): 10 mg via ORAL
  Filled 2012-02-15 (×3): qty 1

## 2012-02-15 MED ORDER — MIDAZOLAM HCL 2 MG/2ML IJ SOLN
INTRAMUSCULAR | Status: AC
  Filled 2012-02-15: qty 2

## 2012-02-15 MED ORDER — ACETAMINOPHEN 325 MG PO TABS: 650.0000 mg | ORAL_TABLET | ORAL | Status: DC | PRN

## 2012-02-15 MED ORDER — BIVALIRUDIN 250 MG IV SOLR
INTRAVENOUS | Status: AC
  Filled 2012-02-15: qty 250

## 2012-02-15 MED ORDER — HEPARIN (PORCINE) IN NACL 100-0.45 UNIT/ML-% IJ SOLN
650.0000 [IU]/h | INTRAMUSCULAR | Status: DC
  Filled 2012-02-15: qty 250

## 2012-02-15 MED ORDER — MORPHINE SULFATE 2 MG/ML IJ SOLN
2.0000 mg | Freq: Once | INTRAMUSCULAR | Status: AC
  Administered 2012-02-15: 2 mg via INTRAVENOUS

## 2012-02-15 MED ORDER — AMLODIPINE BESYLATE 5 MG PO TABS
5.0000 mg | ORAL_TABLET | Freq: Every day | ORAL | Status: DC
  Filled 2012-02-15: qty 1

## 2012-02-15 NOTE — CV Procedure (Signed)
  Cardiac Catheterization Procedure Note  Name: [name] MRN: [id number] DOB: 1945/04/19  Procedure: Left Heart Cath, Selective Coronary Angiography, LV angiography,  PTCA/Stent of the mid RCA  Indication: 67 year old [place] male presents with an acute ST elevation myocardial infarction with 2-3 mm of ST elevation in the inferior leads.   Diagnostic Procedure Details: The right groin was prepped, draped, and anesthetized with 1% lidocaine. Using the modified Seldinger technique, a 6 French sheath was introduced into the right femoral artery. Standard Judkins catheters were used for selective coronary angiography and left ventriculography. Catheter exchanges were performed over a wire.  The diagnostic procedure was well-tolerated without immediate complications.  PROCEDURAL FINDINGS Hemodynamics: AO 157/87 with a mean of 114 mmHg LV 158/12 mmHg  Coronary angiography: Coronary dominance: right  Left mainstem: There is mild tapering of the distal left main of 20%.  Left anterior descending (LAD): The LAD has 30-40% disease in the mid vessel at the takeoff of the diagonal. The diagonal has 50% ostial stenosis.  Left circumflex (LCx): The left circumflex gives rise to a single marginal branch. There is a 50-60% stenosis in the mid left circumflex.  Right coronary artery (RCA): The right coronary is a large dominant vessel. It has a focal 99% stenosis in the mid vessel. The distal vessel is diffusely diseased up to 20-30%. The posterior lateral branch has a 50% stenosis proximally.  Left ventriculography: Left ventricular systolic function is normal, LVEF is estimated at 55-65%, there is no significant mitral regurgitation   PCI Procedure Note:  Following the diagnostic procedure, the decision was made to proceed with PCI of the mid RCA. Weight-based bivalirudin was given for anticoagulation. Plavix 600 mg was given orally. Once a therapeutic ACT was achieved, a 6 [place] FL4 guide  catheter was inserted.  A pro-water coronary guidewire was used to cross the lesion.  The lesion was predilated with a 2.5 mm compliant balloon.  The lesion was then stented with a 3.0 x 12 mm Veriflex stent.  The stent was postdilated with a 3.75 mm noncompliant balloon.  Following PCI, there was 0% residual stenosis and TIMI-3 flow. Final angiography confirmed an excellent result. Femoral hemostasis was achieved with manual compression.  The patient tolerated the PCI procedure well. There were no immediate procedural complications.  The patient was transferred to the post catheterization recovery area for further monitoring.  PCI Data: Vessel - RCA/Segment - mid Percent Stenosis (pre)  99% TIMI-flow 3 Stent 3.5 x 12 mm Veriflex Percent Stenosis (post) 0% TIMI-flow (post) 3  Final Conclusions:   1. Single vessel obstructive coronary disease with culprit lesion in the mid RCA. 2. Normal LV function. 3. Successful stenting of the mid RCA with a bare-metal stents.  Recommendations: Continue dual antiplatelet therapy for one year. Patient will be admitted to fast track and may be eligible for discharge in 48 hours if his course is stable.  [name] [facility] 02/15/2012, 2:02 AM

## 2012-02-15 NOTE — Progress Notes (Signed)
Cardiac CathLab Sheath Removal: Right groin level 0 prior to removal, sheath removed at 0414, manual pressure applied for 30 minutes with out complications. Patient tolerated well, vital signs stable. Groin level 0 after sheath pull. Peripheral pulse +2. Instructions provided regarding bedrest, site protection and care to patient and spouse who is at the bedside. Pressure dressing applied to site.  Jorge Ny [place]

## 2012-02-15 NOTE — Progress Notes (Signed)
ANTICOAGULATION CONSULT NOTE - Initial Consult  Pharmacy Consult for heparin Indication: chest pain/ACS  No Known Allergies  Patient Measurements: Height: 5' (152.4 cm) Weight: 120 lb (54.432 kg) IBW/kg (Calculated) : 50  Heparin Dosing Weight: 54 kg  Vital Signs: Pulse Rate: 62  (02/07 0104)  Labs:  Basename 02/15/12 0109  HGB 16.3  HCT 48.0  PLT --  APTT --  LABPROT --  INR --  HEPARINUNFRC --  CREATININE 1.00  CKTOTAL --  CKMB --  TROPONINI --    Estimated Creatinine Clearance: 50.7 ml/min (by C-G formula based on Cr of 1).   Medical History: Past Medical History  Diagnosis Date  . COPD (chronic obstructive pulmonary disease)     former smoker  . GERD (gastroesophageal reflux disease)   . NSTEMI (non-ST elevated myocardial infarction) 6/11    thought due to vasospasm . cath - CAD LAD 40-50%, LCX 40%, RCA 25% EF 65%. lexiscan myoview EF 68% w/o evidence of ischemia or infarct  . HTN (hypertension)   . Asthma     Medications:  Scheduled:    . [COMPLETED] heparin  60 Units/kg Intravenous Once  . heparin  60 Units/kg Intravenous STAT  . [COMPLETED]  morphine injection  2 mg Intravenous Once  . [COMPLETED] ondansetron (ZOFRAN) IV  4 mg Intravenous Once    Assessment: 67 yo male presented with r/o ACS. Pharmacy to manage heparin. Patient has already received 4000 units IV heparin bolus. Baseline labs pending.   Goal of Therapy:  Heparin level 0.3-0.7 units/ml Monitor platelets by anticoagulation protocol: Yes   Plan:  1. Heparin IV infusion of 650 units/hr.  2. Heparin level in 6 hours.  3. Daily CBC, heparin level.  Emeline Gins 02/15/2012,1:16 AM

## 2012-02-15 NOTE — Progress Notes (Signed)
Pt is sneezing uncontrollably, nose running perfusely, eyes watery, appears to have allergy to something. Theodore Demark PA notified, benedryl and claritin ordered. Emelda Brothers RN

## 2012-02-15 NOTE — Progress Notes (Signed)
Patient Name: [name] Date of Encounter: 02/15/2012     Active Problems:  CAD  OBSTRUCTIVE CHRONIC BRONCHITIS  COPD Gold C    SUBJECTIVE - Patient feels much better, has mild chest pain, 2/10 in severity. Has mild cough and SOB which is at her baseline due to COPD.  - surgical site look fine - No fever or chills.  CURRENT MEDS Scheduled Meds:    . amLODipine  5 mg Oral Daily  . aspirin EC  81 mg Oral Daily  . atorvastatin  80 mg Oral q1800  . clopidogrel  75 mg Oral Q breakfast  . fluticasone  2 puff Inhalation BID  . heparin      . potassium chloride SA  20 mEq Oral Daily  . predniSONE  10 mg Oral Q breakfast  . tiotropium  18 mcg Inhalation Daily   Continuous Infusions:    . sodium chloride    . sodium chloride     PRN Meds:.acetaminophen, albuterol, morphine injection, ondansetron (ZOFRAN) IV  OBJECTIVE  Filed Vitals:   02/15/12 0515 02/15/12 0530 02/15/12 0600 02/15/12 0630  BP:  134/73 132/77 135/72  Pulse: 66 65 66 68  Temp:      TempSrc:      Resp: 14 19 16 15   Height:      Weight:      SpO2: 99% 99% 98% 98%    Intake/Output Summary (Last 24 hours) at 02/15/12 0705 Last data filed at 02/15/12 0700  Gross per 24 hour  Intake 286.67 ml  Output    650 ml  Net -363.33 ml   CVP:  Filed Weights   02/15/12 0114 02/15/12 0231  Weight: 120 lb (54.432 kg) 130 lb 4.7 oz (59.1 kg)    PHYSICAL EXAM  General: Pleasant, NAD. Neuro: Alert and oriented X 3. Moves all extremities spontaneously. Psych: Normal affect. HEENT:  Normal  Neck: Supple without bruits or JVD. Lungs: Good air movement, has diffused wheezing, no rales or rubs.  Heart: RRR no s3, s4, or murmurs. Abdomen: Soft, non-tender, non-distended, BS + x 4.  Extremities: No clubbing, cyanosis or edema. DP/PT/Radials 2+ and equal bilaterally.  Accessory Clinical Findings  CBC  Basename 02/15/12 0532 02/15/12 0109 02/15/12 0108  WBC 8.7 -- 14.0*  NEUTROABS -- -- --  HGB 15.3 16.3  --  HCT 44.9 48.0 --  MCV 90.7 -- 89.8  PLT 258 -- 288   Basic Metabolic Panel   Lab 02/15/12 1610 02/15/12 0109 02/15/12 0108  NA 138 141 139  K 4.1 3.9 --  CL 104 106 104  CO2 27 -- 25  GLUCOSE 102* 117* 114*  BUN 16 20 19   CREATININE 0.85 1.00 0.90  CALCIUM 8.5 -- 8.6  MG -- -- --  PHOS -- -- --     Liver Function Tests  Basename 02/15/12 0108  AST 18  ALT 17  ALKPHOS 66  BILITOT 0.2*  PROT 6.3  ALBUMIN 3.4*   No results found for this basename: LIPASE:2,AMYLASE:2 in the last 72 hours Cardiac Enzymes No results found for this basename: CKTOTAL:3,CKMB:3,CKMBINDEX:3,TROPONINI:3 in the last 72 hours BNP No components found with this basename: POCBNP:3 D-Dimer No results found for this basename: DDIMER:2 in the last 72 hours Hemoglobin A1C No results found for this basename: HGBA1C in the last 72 hours Fasting Lipid Panel No results found for this basename: CHOL,HDL,LDLCALC,TRIG,CHOLHDL,LDLDIRECT in the last 72 hours Thyroid Function Tests No results found for this basename: TSH,T4TOTAL,FREET3,T3FREE,THYROIDAB in the last  72 hours   TELE: sinus rhythm, regular, HR is about 70/min  ECG: ST elevation improved significantly.   2D-Echo: 06/08/09  Left ventricle: The cavity size was normal. Zenaida Tesar thickness was increased in a pattern of mild LVH. The estimated ejection fraction was 65%. Caylor Cerino motion was normal; there were no regional Carmalita Wakefield motion abnormalities. Doppler parameters are consistent with abnormal left ventricular relaxation (grade 1 diastolic dysfunction).   Radiology/Studies  No results found.  ASSESSMENT AND PLAN  Patient is 67 yo man with PMH of CAD and COPD, who presents with several hours of chest pain ( patient could not tell exact how long his chest pain lasted). He was found to have STEMI in inferior leads. He is s/p 3.5x12 BMS (postdilated to 3.75) to mid RCA. LV function preserved.   #: STEMI: s/p BMS. EKG is better. Chest pain  improved.  - continue ASA and Plavix. - continue Lipitor - consider to do 2D echo - risk factor stratification: check A1c and lipid profile  # COPD: has diffused wheezing, but no rale or productive cough, no need for antibiotics currently.  - Continue albuterol,  Flovent and Spiriva inhalers, and prednisone  # HTN: bp is 144/66 by A line.  -continue amlodipine. May increase to 10 mg daily.  Signed, Lorretta Harp NP Patient examined and agree except changes made. Will change amlodopine to lisinopril. Tobacco cessation. Cardiac rehab. Move to telemetry.  Valera Castle, MD 02/15/2012 9:11 AM

## 2012-02-15 NOTE — ED Notes (Signed)
Pt took 325 of asa at home today

## 2012-02-15 NOTE — ED Notes (Addendum)
Per EMS: pt c/o chest pain. Family reported chest pain for two days. pt given 6mg  morphine, nitro x1. Pt is diaphoretic, EKG showed ST elevation. No shortness of breath. Pt speaks very limited english. EDP Opitiz and Dr. Charm Barges with cardiology at bedside

## 2012-02-15 NOTE — ED Notes (Signed)
Zoll pads placed on pt.

## 2012-02-15 NOTE — Care Management Note (Signed)
    Page 1 of 1   02/15/2012     9:13:46 AM   CARE MANAGEMENT NOTE 02/15/2012  Patient:  [name], [name]   Account Number:  [id number]  Date Initiated:  02/15/2012  Documentation initiated by:  Junius Creamer  Subjective/Objective Assessment:   adm w mi     Action/Plan:   lives w wife   Anticipated DC Date:     Anticipated DC Plan:        DC Planning Services  CM consult      Choice offered to / List presented to:             Status of service:   Medicare Important Message given?   (If response is "NO", the following Medicare IM given date fields will be blank) Date Medicare IM given:   Date Additional Medicare IM given:    Discharge Disposition:    Per UR Regulation:  Reviewed for med. necessity/level of care/duration of stay  If discussed at Long Length of Stay Meetings, dates discussed:    Comments:  2/7 0912 debbie Kristina Bertone rn,bsn

## 2012-02-15 NOTE — ED Notes (Signed)
Pt transport to cath lab.

## 2012-02-15 NOTE — ED Notes (Signed)
4000 unit heparin bolus given in left AC IV site

## 2012-02-15 NOTE — ED Provider Notes (Signed)
History     CSN: [id number]  Arrival date & time 02/15/12  0106   First MD Initiated Contact with Patient 02/15/12 0106      Chief Complaint  Patient presents with  . Code STEMI    (Consider location/radiation/quality/duration/timing/severity/associated sxs/prior treatment) HPI History provided by EMS and limited history provided by patient as he is not primarily English-speaking. Son-in-law called EMS tonight for severe chest pain onset a few hours prior to calling EMS. No associated vomiting. Unable to say if he has any shortness of breath but does have a history of COPD. has history of in NSTEMI in the past. Onset at home. Level V caveat applies for language barrier.  Code STEMI called in route to the hospital. Aspirin provided.  Past Medical History  Diagnosis Date  . COPD (chronic obstructive pulmonary disease)     former smoker  . GERD (gastroesophageal reflux disease)   . NSTEMI (non-ST elevated myocardial infarction) 6/11    thought due to vasospasm . cath - CAD LAD 40-50%, LCX 40%, RCA 25% EF 65%. lexiscan myoview EF 68% w/o evidence of ischemia or infarct  . HTN (hypertension)   . Asthma     Past Surgical History  Procedure Date  . Left foot surgery     repair - and ankle     No family history on file.  History  Substance Use Topics  . Smoking status: Former Smoker -- 1.0 packs/day for 50 years    Types: Cigarettes    Quit date: 01/09/2008  . Smokeless tobacco: Never Used     Comment: rolled own cigarettes since age of 65,   . Alcohol Use: Not on file     Comment: occassional       Review of Systems  Unable to perform ROS  positive for chest pain otherwise unable to obtain - level V caveat as above  Allergies  Review of patient's allergies indicates no known allergies.  Home Medications   Current Outpatient Rx  Name  Route  Sig  Dispense  Refill  . ALBUTEROL SULFATE (2.5 MG/3ML) 0.083% IN NEBU   Nebulization   Take 2.5 mg by nebulization 2 (two)  times daily. And as needed. Dx 496,  FILE MCR PART B         . ALBUTEROL SULFATE (2.5 MG/3ML) 0.083% IN NEBU      USE 1 VIAL VIA NEBULIZER EVERY 6 HOURS AS NEEDED FOR WHEEZING   375 mL   5   . AMLODIPINE BESYLATE 5 MG PO TABS               . ASPIRIN 81 MG PO TABS   Oral   Take 81 mg by mouth daily as needed.          . AZITHROMYCIN 250 MG PO TABS   Oral   Take 1 tablet (250 mg total) by mouth daily. Take two once then one daily until gone   6 each   0   . BECLOMETHASONE DIPROPIONATE 80 MCG/ACT IN AERS   Inhalation   Inhale 2 puffs into the lungs 2 (two) times daily.   1 Inhaler   4   . OMEPRAZOLE 20 MG PO CPDR      TAKE ONE CAPSULE BY MOUTH EVERY DAY   30 capsule   3   . POTASSIUM CHLORIDE CRYS ER 20 MEQ PO TBCR   Oral   Take 20 mEq by mouth daily.         [name]  PREDNISONE 10 MG PO TABS      Take 4 for three days 3 for three days 2 for three days then one daily and stay   60 tablet   6   . PROVENTIL HFA 108 (90 BASE) MCG/ACT IN AERS      INHALE 2 PUFFS INTO THE LUNGS EVERY 4 (FOUR) HOURS AS NEEDED FOR WHEEZING.   6.7 each   2   . SPIRIVA HANDIHALER 18 MCG IN CAPS      INHALE 1 CAPSULE ONCE DAILY   30 each   6     Pulse 62  Resp 24  SpO2 100%  Physical Exam  Constitutional: He appears well-developed and well-nourished.  HENT:  Head: Normocephalic and atraumatic.  Eyes: Conjunctivae normal are normal. Pupils are equal, round, and reactive to light. No scleral icterus.  Neck: Normal range of motion. Neck supple.  Cardiovascular: Normal rate, regular rhythm and intact distal pulses.   Pulmonary/Chest: Effort normal and breath sounds normal. No stridor. No respiratory distress. He has no wheezes.  Abdominal: Soft. Bowel sounds are normal. He exhibits no distension. There is no tenderness.  Musculoskeletal: Normal range of motion. He exhibits no edema and no tenderness.  Neurological:       Awake alert and appropriately interactive despite  language barrier  Skin: Skin is warm and dry.    ED Course  Procedures (including critical care time)  Results for orders placed during the hospital encounter of 02/15/12  APTT      Component Value Range   aPTT 29  24 - 37 seconds  CBC      Component Value Range   WBC 14.0 (*) 4.0 - 10.5 K/uL   RBC 5.08  4.22 - 5.81 MIL/uL   Hemoglobin 16.1  13.0 - 17.0 g/dL   HCT 16.1  09.6 - 04.5 %   MCV 89.8  78.0 - 100.0 fL   MCH 31.7  26.0 - 34.0 pg   MCHC 35.3  30.0 - 36.0 g/dL   RDW 40.9  81.1 - 91.4 %   Platelets 288  150 - 400 K/uL  COMPREHENSIVE METABOLIC PANEL      Component Value Range   Sodium 139  135 - 145 mEq/L   Potassium 3.9  3.5 - 5.1 mEq/L   Chloride 104  96 - 112 mEq/L   CO2 25  19 - 32 mEq/L   Glucose, Bld 114 (*) 70 - 99 mg/dL   BUN 19  6 - 23 mg/dL   Creatinine, Ser 7.82  0.50 - 1.35 mg/dL   Calcium 8.6  8.4 - 95.6 mg/dL   Total Protein 6.3  6.0 - 8.3 g/dL   Albumin 3.4 (*) 3.5 - 5.2 g/dL   AST 18  0 - 37 U/L   ALT 17  0 - 53 U/L   Alkaline Phosphatase 66  39 - 117 U/L   Total Bilirubin 0.2 (*) 0.3 - 1.2 mg/dL   GFR calc non Af Amer 86 (*) >90 mL/min   GFR calc Af Amer >90  >90 mL/min  PROTIME-INR      Component Value Range   Prothrombin Time 12.4  11.6 - 15.2 seconds   INR 0.93  0.00 - 1.49  POCT I-STAT, CHEM 8      Component Value Range   Sodium 141  135 - 145 mEq/L   Potassium 3.9  3.5 - 5.1 mEq/L   Chloride 106  96 - 112 mEq/L   BUN 20  6 - 23 mg/dL   Creatinine, Ser 4.09  0.50 - 1.35 mg/dL   Glucose, Bld 811 (*) 70 - 99 mg/dL   Calcium, Ion 9.14 (*) 1.13 - 1.30 mmol/L   TCO2 24  0 - 100 mmol/L   Hemoglobin 16.3  13.0 - 17.0 g/dL   HCT 78.2  95.6 - 21.3 %  POCT I-STAT TROPONIN I      Component Value Range   Troponin i, poc 0.14 (*) 0.00 - 0.08 ng/mL   Comment NOTIFIED PHYSICIAN     Comment 3           MRSA PCR SCREENING      Component Value Range   MRSA by PCR NEGATIVE  NEGATIVE  CBC      Component Value Range   WBC 8.7  4.0 - 10.5 K/uL    RBC 4.95  4.22 - 5.81 MIL/uL   Hemoglobin 15.3  13.0 - 17.0 g/dL   HCT 08.6  57.8 - 46.9 %   MCV 90.7  78.0 - 100.0 fL   MCH 30.9  26.0 - 34.0 pg   MCHC 34.1  30.0 - 36.0 g/dL   RDW 62.9  52.8 - 41.3 %   Platelets 258  150 - 400 K/uL   CRITICAL CARE Performed by: Sunnie Nielsen   Total critical care time: 30  Critical care time was exclusive of separately billable procedures and treating other patients.  Critical care was necessary to treat or prevent imminent or life-threatening deterioration.  Critical care was time spent personally by me on the following activities: development of treatment plan with patient and/or surrogate as well as nursing, discussions with consultants, evaluation of patient's response to treatment, examination of patient, obtaining history from patient or surrogate, ordering and performing treatments and interventions, ordering and review of laboratory studies, ordering and review of radiographic studies, pulse oximetry and re-evaluation of patient's condition. Family updated bedside, patient's son-in-law confirms limited history of present illness per EMS as above    Date: 02/15/2012  Rate: 60  Rhythm: normal sinus rhythm  QRS Axis: normal  Intervals: normal  ST/T Wave abnormalities: ST elevations inferiorly  Conduction Disutrbances:none  Narrative Interpretation:   Old EKG Reviewed: changes noted previous ECG NSR no ST changes    1. STEMI (ST elevation myocardial infarction)    ASA PTA  Cardiology fellow bedside on patient arrival to emergency dept. Cath Lab activated and pending. Labs and heparin ordered. IV morphine provided pain control. Patient sent to the Cath Lab without any significant changes MDM   Chest pain/ STEMI  ECG reviewed.  Troponin elevated  Cath Lab  Cardiology admit      Sunnie Nielsen, MD 02/15/12 [phone]

## 2012-02-15 NOTE — Progress Notes (Signed)
Ed completed through interpreter. Family came at end and added clarification. Pt thinking about quitting smoking, gave resources. Thinking about [facility] and requests his name be sent to [place] [facility]. Will send. Pt's nose dripped/ran entire time with him.Many times pt needed to sneeze but could not. Skin appears red across bridge of nose.  [id number] [name] CES, ACSM

## 2012-02-15 NOTE — Progress Notes (Signed)
Report given to [place]; Pt transferred to 3W by RN; oriented to room; hooked up to portable monitor;

## 2012-02-15 NOTE — Progress Notes (Signed)
Chaplain responded to EMS Code [facility] page. Patient was assessed by medical and taken to Cath Lab.  Chaplain provided pastoral present to family present.  Assisted family to cath lab waiting area to wait of medical up-date from Dr.  Memory Argue comfort measures and assistance to family during this time.  Chaplain will follow-up at later time to check on status. Patient admitted to [address]. On-Call Chaplain Janell Quiet [phone]

## 2012-02-15 NOTE — H&P (Signed)
Cardiology H&P  Primary Care Povider: No primary provider on file.  HPI: [name] is a 67 y.o.male with COPD and prior NSTEMI without obvious culprit lesion who presented to the ED tonight with chest pain and inferior STEMI.  He was in a great deal of pain at presentation and difficult to obtain a history from.  He also speaks limited [place].  His son-in-law was the only family present and some of the history was obtained from him.  The patient reports pain that began a few hours ago but he is not sure when.  It is severe and substernal.  He has not had any nausea/vomiting.  He does not remember ever having pain this severe before.  His son-in-law reports that he has COPD and takes multiple inhalers for this.  He was taken directly to the cath lab and underwent PCI of his mRCA with BMS.    Past Medical History  Diagnosis Date  . COPD (chronic obstructive pulmonary disease)     former smoker  . GERD (gastroesophageal reflux disease)   . NSTEMI (non-ST elevated myocardial infarction) 6/11    thought due to vasospasm . cath - CAD LAD 40-50%, LCX 40%, RCA 25% EF 65%. lexiscan myoview EF 68% w/o evidence of ischemia or infarct  . HTN (hypertension)   . Asthma     Past Surgical History  Procedure Date  . Left foot surgery     repair - and ankle     No family history on file. - unknown, patient could not provide  Social History:  reports that he quit smoking about 4 years ago. His smoking use included Cigarettes. He has a 50 pack-year smoking history. He has never used smokeless tobacco. His alcohol and drug histories not on file.  Allergies: No Known Allergies  Current Facility-Administered Medications  Medication Dose Route Frequency Provider Last Rate Last Dose  . 0.9 %  sodium chloride infusion  1 mL/kg/hr Intravenous Continuous Peter M Swaziland, MD      . 0.9 %  sodium chloride infusion   Intravenous Continuous Peter M Swaziland, MD      . acetaminophen (TYLENOL) tablet 650 mg  650 mg Oral  Q4H PRN Peter M Swaziland, MD      . albuterol (PROVENTIL HFA;VENTOLIN HFA) 108 (90 BASE) MCG/ACT inhaler 2 puff  2 puff Inhalation Q6H PRN Peter M Swaziland, MD      . amLODipine (NORVASC) tablet 5 mg  5 mg Oral Daily Peter M Swaziland, MD      . aspirin EC tablet 81 mg  81 mg Oral Daily Peter M Swaziland, MD      . atorvastatin (LIPITOR) tablet 80 mg  80 mg Oral q1800 Peter M Swaziland, MD      . clopidogrel (PLAVIX) tablet 75 mg  75 mg Oral Q breakfast Peter M Swaziland, MD      . fluticasone (FLOVENT HFA) 44 MCG/ACT inhaler 2 puff  2 puff Inhalation BID Peter M Swaziland, MD      . heparin 5000 UNIT/ML injection           . morphine 2 MG/ML injection 2 mg  2 mg Intravenous Q2H PRN Peter M Swaziland, MD      . ondansetron [facility]) injection 4 mg  4 mg Intravenous Q6H PRN Peter M Swaziland, MD      . potassium chloride SA (K-DUR,KLOR-CON) CR tablet 20 mEq  20 mEq Oral Daily Peter M Swaziland, MD      .  predniSONE (DELTASONE) tablet 10 mg  10 mg Oral Q breakfast Peter M Swaziland, MD      . tiotropium [facility]) inhalation capsule 18 mcg  18 mcg Inhalation Daily Peter M Swaziland, MD        ROS: unable to obtain a full review of systems due to language barrier  Physical Exam: Blood pressure 116/72, pulse 68, temperature 97.4 F (36.3 C), temperature source Oral, resp. rate 15, height 5\' 4"  (1.626 m), weight 59.1 kg (130 lb 4.7 oz), SpO2 100.00%.  GENERAL: no acute distress.  EYES: Extra ocular movements are intact. There is no lid lag. Sclera is anicteric.  ENT: Oropharynx is clear. Dentition is within normal limits.  NECK: Supple. The thyroid is not enlarged.  LYMPH: There are no masses or lymphadenopathy present.  HEART: Regular rate and rhythm with no m/g/r.  Normal S1/S2. No JVD LUNGS: Clear to auscultation There are no rales, rhonchi, or wheezes.  ABDOMEN: Soft, non-tender, and non-distended with normoactive bowel sounds. There is no hepatosplenomegaly.  EXTREMITIES: No clubbing, cyanosis, or edema.  PULSES: Femoral  pulses were +2 and equal bilaterally. DP/PT pulses were +2 and equal bilaterally.  SKIN: Warm, dry, and intact.  NEUROLOGIC: The patient was oriented to person, place, and time. No overt neurologic deficits were detected.  PSYCH: Normal judgment and insight, mood is appropriate.   Results: Results for orders placed during the hospital encounter of 02/15/12 (from the past 24 hour(s))  POCT I-STAT TROPONIN I     Status: Abnormal   Collection Time   02/15/12  1:07 AM      Component Value Range   Troponin i, poc 0.14 (*) 0.00 - 0.08 ng/mL   Comment NOTIFIED PHYSICIAN     Comment 3           APTT     Status: Normal   Collection Time   02/15/12  1:08 AM      Component Value Range   aPTT 29  24 - 37 seconds  CBC     Status: Abnormal   Collection Time   02/15/12  1:08 AM      Component Value Range   WBC 14.0 (*) 4.0 - 10.5 K/uL   RBC 5.08  4.22 - 5.81 MIL/uL   Hemoglobin 16.1  13.0 - 17.0 g/dL   HCT 16.1  09.6 - 04.5 %   MCV 89.8  78.0 - 100.0 fL   MCH 31.7  26.0 - 34.0 pg   MCHC 35.3  30.0 - 36.0 g/dL   RDW 40.9  81.1 - 91.4 %   Platelets 288  150 - 400 K/uL  COMPREHENSIVE METABOLIC PANEL     Status: Abnormal   Collection Time   02/15/12  1:08 AM      Component Value Range   Sodium 139  135 - 145 mEq/L   Potassium 3.9  3.5 - 5.1 mEq/L   Chloride 104  96 - 112 mEq/L   CO2 25  19 - 32 mEq/L   Glucose, Bld 114 (*) 70 - 99 mg/dL   BUN 19  6 - 23 mg/dL   Creatinine, Ser 7.82  0.50 - 1.35 mg/dL   Calcium 8.6  8.4 - 95.6 mg/dL   Total Protein 6.3  6.0 - 8.3 g/dL   Albumin 3.4 (*) 3.5 - 5.2 g/dL   AST 18  0 - 37 U/L   ALT 17  0 - 53 U/L   Alkaline Phosphatase 66  39 - 117 U/L  Total Bilirubin 0.2 (*) 0.3 - 1.2 mg/dL   GFR calc non Af Amer 86 (*) >90 mL/min   GFR calc Af Amer >90  >90 mL/min  PROTIME-INR     Status: Normal   Collection Time   02/15/12  1:08 AM      Component Value Range   Prothrombin Time 12.4  11.6 - 15.2 seconds   INR 0.93  0.00 - 1.49  POCT I-STAT, CHEM 8      Status: Abnormal   Collection Time   02/15/12  1:09 AM      Component Value Range   Sodium 141  135 - 145 mEq/L   Potassium 3.9  3.5 - 5.1 mEq/L   Chloride 106  96 - 112 mEq/L   BUN 20  6 - 23 mg/dL   Creatinine, Ser 1.47  0.50 - 1.35 mg/dL   Glucose, Bld 829 (*) 70 - 99 mg/dL   Calcium, Ion 5.62 (*) 1.13 - 1.30 mmol/L   TCO2 24  0 - 100 mmol/L   Hemoglobin 16.3  13.0 - 17.0 g/dL   HCT 13.0  86.5 - 78.4 %    EKG: NSR with inferior STE  Assessment/Plan: 67 yo with COPD admitted with inferior STEMI 1. CAD/STEMI: s/p 3.5x12 BMS (postdilated to 3.75) to mid RCA.  LV function preserved - ASA/plavix - lipitor and BB as tolerated 2. COPD: - continue home meds 3. HTN:  - continue amlodipine  Swayze Pries 02/15/2012, 2:38 AM

## 2012-02-15 NOTE — ED Notes (Signed)
Troponin results given to Dr. Dierdre Highman by B. Bing Plume, EMT

## 2012-02-15 NOTE — Progress Notes (Signed)
CARDIAC REHAB PHASE I   PRE:  Rate/Rhythm: 78SR    BP: sitting 132/73    SaO2:   MODE:  Ambulation: 250 ft   POST:  Rate/Rhythm: 88 SR    BP: sitting 142/62     SaO2:   Pt unsteady upon standing. Apparently has bad foot. Used gait belt. Pt sts his chest is tight. Could not tell if this was with breathing or movement or exertion. Seems that it did not get worse with exertion. Pt sts he is really tired walking. To recliner and fell asleep. Will educate with translator.  [phone]  Harriet Masson CES, ACSM

## 2012-02-16 LAB — BASIC METABOLIC PANEL
BUN: 20 mg/dL (ref 6–23)
Calcium: 8.9 mg/dL (ref 8.4–10.5)
Creatinine, Ser: 0.87 mg/dL (ref 0.50–1.35)
GFR calc non Af Amer: 87 mL/min — ABNORMAL LOW (ref >90)
Glucose, Bld: 98 mg/dL (ref 70–99)

## 2012-02-16 LAB — LIPID PANEL
Total CHOL/HDL Ratio: 4.1 RATIO
VLDL: 17 mg/dL (ref 0–40)

## 2012-02-16 MED ORDER — SODIUM CHLORIDE 0.9 % IJ SOLN
3.0000 mL | Freq: Two times a day (BID) | INTRAMUSCULAR | Status: DC
  Administered 2012-02-16 – 2012-02-17 (×2): 3 mL via INTRAVENOUS

## 2012-02-16 MED ORDER — DIPHENHYDRAMINE HCL 25 MG PO CAPS
25.0000 mg | ORAL_CAPSULE | Freq: Every day | ORAL | Status: DC | PRN
  Administered 2012-02-16: 25 mg via ORAL
  Filled 2012-02-16: qty 1

## 2012-02-16 MED ORDER — SODIUM CHLORIDE 0.9 % IJ SOLN
3.0000 mL | Freq: Two times a day (BID) | INTRAMUSCULAR | Status: DC
  Administered 2012-02-16 – 2012-02-17 (×3): 3 mL via INTRAVENOUS

## 2012-02-16 NOTE — Progress Notes (Signed)
CARDIAC REHAB PHASE I   PRE:  Rate/Rhythm: 91SR w/PVCs   BP:  Supine:   Sitting: 110/64  Standing:    SaO2: 97%RA  MODE:  Ambulation: 400 ft   POST:  Rate/Rhythem: 101ST w/PVCs  BP:  Supine:   Sitting: 122/62  Standing:    SaO2: 96%RA 0910-0930 Pt tolerated ambulation fairly with minimal assistance. Pt c/o of mild chest tightness, but stated that some was relieved by walking. Pt still has runny nose, but said it was much better than yesterday. Returned pt to upright bed with call light and phone within reach.  Deetta Perla

## 2012-02-16 NOTE — Progress Notes (Signed)
Patient ID: [name], male   DOB: Mar 24, 1945, 67 y.o.   MRN: [id number]   Patient Name: [name] Date of Encounter: 02/16/2012    SUBJECTIVE  No chest pain or discomfort. Complains of his nose being dry and stopped up. He is wheezing this morning. He's been a smoker prior to admission.  CURRENT MEDS . aspirin EC  81 mg Oral Daily  . atorvastatin  80 mg Oral q1800  . clopidogrel  75 mg Oral Q breakfast  . fluticasone  2 puff Inhalation BID  . lisinopril  5 mg Oral Daily  . loratadine  10 mg Oral Daily  . potassium chloride SA  20 mEq Oral Daily  . predniSONE  10 mg Oral Q breakfast  . tiotropium  18 mcg Inhalation Daily    OBJECTIVE  Filed Vitals:   02/15/12 2006 02/15/12 2030 02/16/12 0547 02/16/12 0742  BP: 122/78  104/71   Pulse: 78  75   Temp: 98.1 F (36.7 C)  98.4 F (36.9 C)   TempSrc: Oral  Oral   Resp: 17  17   Height:      Weight:   128 lb 14.4 oz (58.469 kg)   SpO2: 94% 98% 93% 94%    Intake/Output Summary (Last 24 hours) at 02/16/12 0947 Last data filed at 02/16/12 0700  Gross per 24 hour  Intake     54 ml  Output      0 ml  Net     54 ml   Filed Weights   02/15/12 0114 02/15/12 0231 02/16/12 0547  Weight: 120 lb (54.432 kg) 130 lb 4.7 oz (59.1 kg) 128 lb 14.4 oz (58.469 kg)    PHYSICAL EXAM  General: Pleasant, NAD. Neuro: Alert and oriented X 3. Moves all extremities spontaneously. Psych: Normal affect. HEENT:  Normal  Neck: Supple without bruits or JVD. Lungs:  Resp regular and unlabored, inspiratory expiratory wheezing Heart: RRR no s3, s4, or murmurs. Abdomen: Soft, non-tender, non-distended, BS + x 4.  Extremities: No clubbing, cyanosis or edema. DP/PT/Radials 2+ and equal bilaterally.  Accessory Clinical Findings  CBC  Recent Labs  02/15/12 0108 02/15/12 0109 02/15/12 0532  WBC 14.0*  --  8.7  HGB 16.1 16.3 15.3  HCT 45.6 48.0 44.9  MCV 89.8  --  90.7  PLT 288  --  258   Basic Metabolic Panel  Recent Labs   30/86/57 0532 02/16/12 0550  NA 138 138  K 4.1 4.1  CL 104 102  CO2 27 24  GLUCOSE 102* 98  BUN 16 20  CREATININE 0.85 0.87  CALCIUM 8.5 8.9   Liver Function Tests  Recent Labs  02/15/12 0108  AST 18  ALT 17  ALKPHOS 66  BILITOT 0.2*  PROT 6.3  ALBUMIN 3.4*   No results found for this basename: LIPASE, AMYLASE,  in the last 72 hours Cardiac Enzymes No results found for this basename: CKTOTAL, CKMB, CKMBINDEX, TROPONINI,  in the last 72 hours BNP No components found with this basename: POCBNP,  D-Dimer No results found for this basename: DDIMER,  in the last 72 hours Hemoglobin A1C No results found for this basename: HGBA1C,  in the last 72 hours Fasting Lipid Panel  Recent Labs  02/16/12 0550  CHOL 199  HDL 48  LDLCALC 134*  TRIG 86  CHOLHDL 4.1   Thyroid Function Tests No results found for this basename: TSH, T4TOTAL, FREET3, T3FREE, THYROIDAB,  in the last 72 hours  TELE  Normal  sinus rhythm  ECG  Normal sinus rhythm, evolving inferior changes  Radiology/Studies  No results found.  ASSESSMENT AND PLAN  Active Problems:   CAD   OBSTRUCTIVE CHRONIC BRONCHITIS   COPD Gold C   Patient status post a inferior Michelina Mexicano STEMI and bare-metal stent placed to the RCA. He has COPD and is wheezing this morning. Continue with respiratory therapy as ordered. Cardiac rehabilitation in place. If stable tomorrow will discharge home.  Signed, Valera Castle MD

## 2012-02-17 ENCOUNTER — Encounter (HOSPITAL_COMMUNITY): Payer: Self-pay | Admitting: Physician Assistant

## 2012-02-17 DIAGNOSIS — I10 Essential (primary) hypertension: Secondary | ICD-10-CM

## 2012-02-17 DIAGNOSIS — E1169 Type 2 diabetes mellitus with other specified complication: Secondary | ICD-10-CM

## 2012-02-17 DIAGNOSIS — Z720 Tobacco use: Secondary | ICD-10-CM

## 2012-02-17 DIAGNOSIS — I2119 ST elevation (STEMI) myocardial infarction involving other coronary artery of inferior wall: Secondary | ICD-10-CM

## 2012-02-17 DIAGNOSIS — J309 Allergic rhinitis, unspecified: Secondary | ICD-10-CM

## 2012-02-17 MED ORDER — CLOPIDOGREL BISULFATE 75 MG PO TABS: 75.0000 mg | ORAL_TABLET | Freq: Every day | ORAL | Status: DC

## 2012-02-17 MED ORDER — VARENICLINE TARTRATE 1 MG PO TABS: 1.0000 mg | ORAL_TABLET | Freq: Two times a day (BID) | ORAL | Status: DC

## 2012-02-17 MED ORDER — ATORVASTATIN CALCIUM 80 MG PO TABS: 80.0000 mg | ORAL_TABLET | Freq: Every day | ORAL | Status: DC

## 2012-02-17 MED ORDER — NITROGLYCERIN 0.4 MG SL SUBL: 0.4000 mg | SUBLINGUAL_TABLET | SUBLINGUAL | Status: DC | PRN

## 2012-02-17 MED ORDER — LISINOPRIL 5 MG PO TABS: 5.0000 mg | ORAL_TABLET | Freq: Every day | ORAL | Status: DC

## 2012-02-17 MED ORDER — PANTOPRAZOLE SODIUM 40 MG PO TBEC: 40.0000 mg | DELAYED_RELEASE_TABLET | Freq: Every day | ORAL | Status: DC

## 2012-02-17 NOTE — Progress Notes (Signed)
Patient ID: [name], male   DOB: 20-Aug-1945, 67 y.o.   MRN: [id number]   Patient Name: [name] Date of Encounter: 02/17/2012    SUBJECTIVE No further chest pain or shortness of breath. Does not want to smoke again. Wants Chantix.  CURRENT MEDS . aspirin EC  81 mg Oral Daily  . atorvastatin  80 mg Oral q1800  . clopidogrel  75 mg Oral Q breakfast  . fluticasone  2 puff Inhalation BID  . lisinopril  5 mg Oral Daily  . loratadine  10 mg Oral Daily  . potassium chloride SA  20 mEq Oral Daily  . predniSONE  10 mg Oral Q breakfast  . sodium chloride  3 mL Intravenous Q12H  . sodium chloride  3 mL Intravenous Q12H  . tiotropium  18 mcg Inhalation Daily    OBJECTIVE  Filed Vitals:   02/16/12 1400 02/16/12 2100 02/17/12 0600 02/17/12 0902  BP: 127/69 128/66 143/77   Pulse: 77 70 85   Temp: 97.7 F (36.5 C) 97.8 F (36.6 C) 97.6 F (36.4 C)   TempSrc: Oral Oral Oral   Resp: 16 18 18    Height:      Weight:   128 lb 15.5 oz (58.5 kg)   SpO2: 97% 98% 98% 96%    Intake/Output Summary (Last 24 hours) at 02/17/12 0953 Last data filed at 02/16/12 1700  Gross per 24 hour  Intake    600 ml  Output      0 ml  Net    600 ml   Filed Weights   02/15/12 0231 02/16/12 0547 02/17/12 0600  Weight: 130 lb 4.7 oz (59.1 kg) 128 lb 14.4 oz (58.469 kg) 128 lb 15.5 oz (58.5 kg)    PHYSICAL EXAM  General: Pleasant, NAD. Neuro: Alert and oriented X 3. Moves all extremities spontaneously. Psych: Normal affect. HEENT:  Normal  Neck: Supple without bruits or JVD. Lungs:  Resp regular and unlabored, CTA. Heart: RRR no s3, s4, or murmurs. Abdomen: Soft, non-tender, non-distended, BS + x 4.  Extremities: No clubbing, cyanosis or edema. DP/PT/Radials 2+ and equal bilaterally.  Accessory Clinical Findings  CBC  Recent Labs  02/15/12 0108 02/15/12 0109 02/15/12 0532  WBC 14.0*  --  8.7  HGB 16.1 16.3 15.3  HCT 45.6 48.0 44.9  MCV 89.8  --  90.7  PLT 288  --  258   Basic  Metabolic Panel  Recent Labs  02/15/12 0532 02/16/12 0550  NA 138 138  K 4.1 4.1  CL 104 102  CO2 27 24  GLUCOSE 102* 98  BUN 16 20  CREATININE 0.85 0.87  CALCIUM 8.5 8.9   Liver Function Tests  Recent Labs  02/15/12 0108  AST 18  ALT 17  ALKPHOS 66  BILITOT 0.2*  PROT 6.3  ALBUMIN 3.4*   No results found for this basename: LIPASE, AMYLASE,  in the last 72 hours Cardiac Enzymes No results found for this basename: CKTOTAL, CKMB, CKMBINDEX, TROPONINI,  in the last 72 hours BNP No components found with this basename: POCBNP,  D-Dimer No results found for this basename: DDIMER,  in the last 72 hours Hemoglobin A1C  Recent Labs  02/16/12 0550  HGBA1C 6.2*   Fasting Lipid Panel  Recent Labs  02/16/12 0550  CHOL 199  HDL 48  LDLCALC 134*  TRIG 86  CHOLHDL 4.1   Thyroid Function Tests No results found for this basename: TSH, T4TOTAL, FREET3, T3FREE, THYROIDAB,  in the last  72 hours  TELE  Normal sinus rhythm  ECG   Radiology/Studies  No results found.  ASSESSMENT AND PLAN  Active Problems:   CAD   OBSTRUCTIVE CHRONIC BRONCHITIS   COPD Gold C    He is ready for discharge. We'll prescribe Chantix. His wheezing has improved but will not start a beta blocker. He needs to followup with Dr. Swaziland in the office in a couple weeks.  Signed, Valera Castle MD

## 2012-02-17 NOTE — Discharge Summary (Signed)
Discharge Summary   Patient ID: [name],  MRN: [id number], DOB/AGE: [birth year] 67 y.o.  Admit date: 02/15/2012 Discharge date: 02/17/2012  Primary Physician: No primary provider on file. Primary Cardiologist: seen in consultation by Dr. Gala Romney in 2011; Dr. Swaziland performed PCI  Discharge Diagnoses Principal Problem:   STEMI (ST elevation myocardial infarction)  - s/p cardiac cath 02/15/12: 20% distal LM, 30-40% mid LAD, 50% ostial diag, 50-60% mid LCx, 99% focal mid RCA stenosis s/p BMS; 20% distal RCA disease, 50% prox PLB; LVEF 55-65%  - DAPT- ASA/Plavix x 1 year Active Problems:   CAD  - ASA/Plavix/ACEi/statin/NTG SL PRN  - BB deferred secondary to COPD and active wheezing on initial admission   - Addition of BB to be determined on follow-up   COPD Gold C  - To resume Spiriva and SABA   GERD  - Omeprazole switched to pantoprazole with concomitant Plavix use   Hyperlipidemia  - High-dose atorvastatin added  - Check LFTs in 6 weeks   Essential hypertension  - ACEi added, check BMET on follow-up   Tobacco abuse  - To discharge on Chantix for NRT  - Establish/follow-up with PCP   Allergic rhinitis   Allergies No Known Allergies  Diagnostic Studies/Procedures  CARDIAC CATHETERIZATION + PERCUTANEOUS CORONARY INTERVENTION - 02/15/12  Hemodynamics:  AO 157/87 with a mean of 114 mmHg  LV 158/12 mmHg  Coronary angiography:  Coronary dominance: right  Left mainstem: There is mild tapering of the distal left main of 20%.  Left anterior descending (LAD): The LAD has 30-40% disease in the mid vessel at the takeoff of the diagonal. The diagonal has 50% ostial stenosis.  Left circumflex (LCx): The left circumflex gives rise to a single marginal branch. There is a 50-60% stenosis in the mid left circumflex.  Right coronary artery (RCA): The right coronary is a large dominant vessel. It has a focal 99% stenosis in the mid vessel. The distal vessel is diffusely diseased up to  20-30%. The posterior lateral branch has a 50% stenosis proximally.  Left ventriculography: Left ventricular systolic function is normal, LVEF is estimated at 55-65%, there is no significant mitral regurgitation   PCI Data:  Vessel - RCA/Segment - mid  Percent Stenosis (pre) 99%  TIMI-flow 3  Stent 3.5 x 12 mm Veriflex  Percent Stenosis (post) 0%  TIMI-flow (post) 3  Final Conclusions:  1. Single vessel obstructive coronary disease with culprit lesion in the mid RCA.  2. Normal LV function.  3. Successful stenting of the mid RCA with a bare-metal stents.  History of Present Illness/Hospital Course    [name] is a 67 y.o. [place] male who speaks limited English who was admitted to [facility] on 02/15/12 with the above problem list. He has a history of significant tobacco abuse results of COPD, and prior and STEMI without an obvious culprit lesion identified during cath in 2011 (this was believed to be secondary to coronary vasospasm). Given his limited fluency and English, but history was difficult to obtain. He presented to [facility] emergency department complaining of several hours of substernal chest pain. EKG in the ED revealed acute inferolateral ST elevation. Code STEMI was activated, and he was transported emergently to the cardiac cath lab.   Access was obtained to the right groin. This demonstrated  the details described above. A 99% mid RCA stenosis was identified as the culprit lesion. A bare-metal stent was successfully placed to the area stenosis. Recommendation was made to continue  dual antiplatelet therapy-aspirin/Plavix- for one year per Dr. Swaziland. He tolerated procedure well without complications. He was admitted to CCU to be placed on fast-track and eligible discharge in 48 hours post PCI. He remained stable overnight with improved chest pain and resolution of ST changes. A lipid panel was obtained which did reveal hyperlipidemia. He was started on high-dose  atorvastatin and a low-dose ACE inhibitor. Given active wheezing on initial admission, a beta blocker was deferred. Hgb A1C returned WNL at 6.2%. He was subsequently transferred to telemetry. He ambulated well with cardiac rehabilitation. He did develop rhinorrhea attributed to allergies, and was started on Claritin and Benadryl with improvement.   He was evaluated by Dr. Daleen Squibb today, and deemed stable for discharge. He has elected to receive nicotine replacement therapy in an effort to assist with tobacco cessation in the form of Chantix. He will followup in the office in </= 7 days given post-STEMI status. Will need to have a BMET checked on follow-up for the addition of ACEi, and LFTs in 6 weeks for starting a statin. The addition of a beta blocker will be assessed on followup. This information, including post cath instructions, has been clearly outlined in the discharge AVS.    Discharge Vitals:  Blood pressure 143/77, pulse 85, temperature 97.6 F (36.4 C), temperature source Oral, resp. rate 18, height 5\' 4"  (1.626 m), weight 58.5 kg (128 lb 15.5 oz), SpO2 96.00%.   Labs: Recent Labs     02/15/12  0108  02/15/12  0109  02/15/12  0532  WBC  14.0*   --   8.7  HGB  16.1  16.3  15.3  HCT  45.6  48.0  44.9  MCV  89.8   --   90.7  PLT  288   --   258    Recent Labs Lab 02/15/12 0108 02/15/12 0109 02/15/12 0532 02/16/12 0550  NA 139 141 138 138  K 3.9 3.9 4.1 4.1  CL 104 106 104 102  CO2 25  --  27 24  BUN 19 20 16 20   CREATININE 0.90 1.00 0.85 0.87  CALCIUM 8.6  --  8.5 8.9  PROT 6.3  --   --   --   BILITOT 0.2*  --   --   --   ALKPHOS 66  --   --   --   ALT 17  --   --   --   AST 18  --   --   --   GLUCOSE 114* 117* 102* 98   Recent Labs     02/16/12  0550  HGBA1C  6.2*   Recent Labs     02/16/12  0550  CHOL  199  HDL  48  LDLCALC  134*  TRIG  86  CHOLHDL  4.1   Disposition:  Discharge Orders   Future Orders Complete By Expires     Amb Referral to Cardiac  Rehabilitation  As directed     Diet - low sodium heart healthy  As directed     Increase activity slowly  As directed           Follow-up Information   Follow up with [facility]. (Office will call you with an appointment date and time. )    Contact information:   [address] [place] [address]       Please follow up. (Please follow-up/establish with a primary care provider for post-hospital follow-up in 1-2 weeks. )  Discharge Medications:    Medication List    STOP taking these medications       omeprazole 20 MG capsule  Commonly known as:  PRILOSEC      TAKE these medications       albuterol (2.5 MG/3ML) 0.083% nebulizer solution  Commonly known as:  PROVENTIL  Take 2.5 mg by nebulization 2 (two) times daily. And as needed. Dx 496,  FILE MCR PART B     albuterol 108 (90 BASE) MCG/ACT inhaler  Commonly known as:  PROVENTIL HFA;VENTOLIN HFA  Inhale 2 puffs into the lungs every 6 (six) hours as needed. For shortness of breath     amLODipine 5 MG tablet  Commonly known as:  NORVASC  Take 5 mg by mouth daily.     aspirin 81 MG tablet  Take 81 mg by mouth daily as needed.     atorvastatin 80 MG tablet  Commonly known as:  LIPITOR  Take 1 tablet (80 mg total) by mouth daily at 6 PM.     beclomethasone 80 MCG/ACT inhaler  Commonly known as:  QVAR  Inhale 2 puffs into the lungs 2 (two) times daily.     clopidogrel 75 MG tablet  Commonly known as:  PLAVIX  Take 1 tablet (75 mg total) by mouth daily with breakfast.     lisinopril 5 MG tablet  Commonly known as:  PRINIVIL,ZESTRIL  Take 1 tablet (5 mg total) by mouth daily.     nitroGLYCERIN 0.4 MG SL tablet  Commonly known as:  NITROSTAT  Place 1 tablet (0.4 mg total) under the tongue every 5 (five) minutes as needed for chest pain.     pantoprazole 40 MG tablet  Commonly known as:  PROTONIX  Take 1 tablet (40 mg total) by mouth daily.     potassium chloride SA 20 MEQ tablet    Commonly known as:  K-DUR,KLOR-CON  Take 20 mEq by mouth daily.     SPIRIVA HANDIHALER 18 MCG inhalation capsule  Generic drug:  tiotropium  INHALE 1 CAPSULE ONCE DAILY     varenicline 1 MG tablet  Commonly known as:  CHANTIX CONTINUING MONTH PAK  Take 1 tablet (1 mg total) by mouth 2 (two) times daily.       Outstanding Labs/Studies: BMET in ~ 1 week; LFTs in 6 weeks  Duration of Discharge Encounter: Greater than 30 minutes including physician time.  Signed, R. Hurman Horn, PA-C 02/17/2012, 11:00 AM   Jesse Sans. Daleen Squibb, MD, [facility] Pager:  [phone]

## 2012-02-18 MED FILL — Dextrose Inj 5%: INTRAVENOUS | Qty: 50 | Status: AC

## 2012-02-21 ENCOUNTER — Other Ambulatory Visit: Payer: Medicare HMO

## 2012-02-21 ENCOUNTER — Ambulatory Visit: Payer: Medicare HMO | Admitting: Nurse Practitioner

## 2012-02-25 ENCOUNTER — Other Ambulatory Visit (INDEPENDENT_AMBULATORY_CARE_PROVIDER_SITE_OTHER): Payer: Medicare HMO

## 2012-02-25 ENCOUNTER — Ambulatory Visit (INDEPENDENT_AMBULATORY_CARE_PROVIDER_SITE_OTHER): Payer: Medicare HMO | Admitting: Nurse Practitioner

## 2012-02-25 ENCOUNTER — Encounter: Payer: Self-pay | Admitting: Nurse Practitioner

## 2012-02-25 VITALS — BP 126/72 | HR 77 | Ht 64.0 in | Wt 131.0 lb

## 2012-02-25 DIAGNOSIS — R0989 Other specified symptoms and signs involving the circulatory and respiratory systems: Secondary | ICD-10-CM

## 2012-02-25 DIAGNOSIS — I219 Acute myocardial infarction, unspecified: Secondary | ICD-10-CM

## 2012-02-25 DIAGNOSIS — I213 ST elevation (STEMI) myocardial infarction of unspecified site: Secondary | ICD-10-CM

## 2012-02-25 DIAGNOSIS — I214 Non-ST elevation (NSTEMI) myocardial infarction: Secondary | ICD-10-CM

## 2012-02-25 NOTE — Progress Notes (Signed)
[name] Date of Birth: [birth year] Medical Record #[id number]  History of Present Illness: [name] is seen back today for a post hospital visit. He is seen for [name]. He has had a recent STEMI with BMS to the RCA. Presented pretty quickly. Other issues include HLD, HTN, tobacco abuse with COPD and GERD. He is on Plavix for one year with his Aspirin. He is also given Chantix.   He comes in today. He is here with an interpreter and his daughter. He is doing much better. No more chest pain. Sometimes gets a little headache and some mild nausea. But overall, much better than on his presentation with his MI. Taking his medicines. Needs follow up labs today. Not smoking! No problems with his groin.  Current Outpatient Prescriptions on File Prior to Visit  Medication Sig Dispense Refill  . albuterol (PROVENTIL HFA;VENTOLIN HFA) 108 (90 BASE) MCG/ACT inhaler Inhale 2 puffs into the lungs every 6 (six) hours as needed. For shortness of breath      . albuterol (PROVENTIL) (2.5 MG/3ML) 0.083% nebulizer solution Take 2.5 mg by nebulization 2 (two) times daily. And as needed. Dx 496,  FILE MCR PART B      . aspirin 81 MG tablet Take 81 mg by mouth daily as needed.       [name] atorvastatin (LIPITOR) 80 MG tablet Take 1 tablet (80 mg total) by mouth daily at 6 PM.  30 tablet  3  . beclomethasone (QVAR) 80 MCG/ACT inhaler Inhale 2 puffs into the lungs 2 (two) times daily.  1 Inhaler  4  . clopidogrel (PLAVIX) 75 MG tablet Take 1 tablet (75 mg total) by mouth daily with breakfast.  30 tablet  3  . lisinopril (PRINIVIL,ZESTRIL) 5 MG tablet Take 1 tablet (5 mg total) by mouth daily.  30 tablet  3  . nitroGLYCERIN (NITROSTAT) 0.4 MG SL tablet Place 1 tablet (0.4 mg total) under the tongue every 5 (five) minutes as needed for chest pain.  25 tablet  3  . pantoprazole (PROTONIX) 40 MG tablet Take 1 tablet (40 mg total) by mouth daily.  30 tablet  3  . SPIRIVA HANDIHALER 18 MCG inhalation capsule INHALE 1 CAPSULE  ONCE DAILY  30 each  6  . varenicline (CHANTIX CONTINUING MONTH PAK) 1 MG tablet Take 1 tablet (1 mg total) by mouth 2 (two) times daily.  30 tablet  0  . [DISCONTINUED] arformoterol (BROVANA) 15 MCG/2ML NEBU Take 2 mLs (15 mcg total) by nebulization 2 (two) times daily. Dx code: 496  120 mL  11  . [DISCONTINUED] budesonide (PULMICORT) 0.5 MG/2ML nebulizer solution Take 2 mLs (0.5 mg total) by nebulization 2 (two) times daily. DX:  496  120 mL  11  . [DISCONTINUED] esomeprazole (NEXIUM) 40 MG capsule Take 1 capsule (40 mg total) by mouth daily.  30 capsule  6   No current facility-administered medications on file prior to visit.    No Known Allergies  Past Medical History  Diagnosis Date  . COPD (chronic obstructive pulmonary disease)     former smoker  . GERD (gastroesophageal reflux disease)   . CAD (coronary artery disease), native coronary artery 6/11, 2/14    a. thought due to vasospasm . cath - CAD LAD 40-50%, LCX 40%, RCA 25% EF 65%. lexiscan myoview EF 68% w/o evidence of ischemia or infarct b. STEMI s/p BMS-mid RCA  . HTN (hypertension)   . Asthma   . Hyperlipidemia   . Tobacco  abuse   . Allergic rhinitis     Past Surgical History  Procedure Laterality Date  . Left foot surgery      repair - and ankle   . Coronary angioplasty with stent placement  02/15/12    20% distal LM, 30-40% mid LAD, 50% ostial diag, 50-60% mid LCx, 99% focal mid RCA stenosis s/p BMS; 20% distal RCA disease, 50% prox PLB; LVEF 55-65%    History  Smoking status  . Former Smoker -- 1.00 packs/day for 50 years  . Types: Cigarettes  . Quit date: 01/09/2008  Smokeless tobacco  . Never Used    Comment: rolled own cigarettes since age of 73,     History  Alcohol Use No    Comment: occassional     History reviewed. No pertinent family history.  Review of Systems: The review of systems is per the HPI.  All other systems were reviewed and are negative.  Physical Exam: BP 126/72  Pulse 77  Ht  5\' 4"  (1.626 m)  Wt 131 lb (59.421 kg)  BMI 22.47 kg/m2 Patient is very pleasant and in no acute distress. Skin is warm and dry. Color is normal.  HEENT is unremarkable. Normocephalic/atraumatic. PERRL. Sclera are nonicteric. Neck is supple. No masses. No JVD. Lungs are clear. Cardiac exam shows a regular rate and rhythm. Abdomen is soft. Extremities are without edema. Gait and ROM are intact. No gross neurologic deficits noted.  LABORATORY DATA: Pending  EKG today shows sinus rhythm and has normalized. Tracing was reviewed with [name].     Chemistry      Component Value Date/Time   NA 138 02/16/2012 0550   K 4.1 02/16/2012 0550   CL 102 02/16/2012 0550   CO2 24 02/16/2012 0550   BUN 20 02/16/2012 0550   CREATININE 0.87 02/16/2012 0550      Component Value Date/Time   CALCIUM 8.9 02/16/2012 0550   ALKPHOS 66 02/15/2012 0108   AST 18 02/15/2012 0108   ALT 17 02/15/2012 0108   BILITOT 0.2* 02/15/2012 0108     Lab Results  Component Value Date   WBC 8.7 02/15/2012   HGB 15.3 02/15/2012   HCT 44.9 02/15/2012   MCV 90.7 02/15/2012   PLT 258 02/15/2012   Lab Results  Component Value Date   CKTOTAL 57 12/21/2010   CKMB 2.4 12/21/2010   TROPONINI <0.30 12/21/2010   Coronary angiography:   Left mainstem: There is mild tapering of the distal left main of 20%.  Left anterior descending (LAD): The LAD has 30-40% disease in the mid vessel at the takeoff of the diagonal. The diagonal has 50% ostial stenosis.  Left circumflex (LCx): The left circumflex gives rise to a single marginal branch. There is a 50-60% stenosis in the mid left circumflex.  Right coronary artery (RCA): The right coronary is a large dominant vessel. It has a focal 99% stenosis in the mid vessel. The distal vessel is diffusely diseased up to 20-30%. The posterior lateral branch has a 50% stenosis proximally.   Left ventriculography: Left ventricular systolic function is normal, LVEF is estimated at 55-65%, there is no significant mitral  regurgitation   PCI Procedure Note: Following the diagnostic procedure, the decision was made to proceed with PCI of the mid RCA. Weight-based bivalirudin was given for anticoagulation. Plavix 600 mg was given orally. Once a therapeutic ACT was achieved, a 6 [place] FL4 guide catheter was inserted. A pro-water coronary guidewire was used to cross the lesion.  The lesion was predilated with a 2.5 mm compliant balloon. The lesion was then stented with a 3.0 x 12 mm Veriflex stent. The stent was postdilated with a 3.75 mm noncompliant balloon. Following PCI, there was 0% residual stenosis and TIMI-3 flow. Final angiography confirmed an excellent result. Femoral hemostasis was achieved with manual compression. The patient tolerated the PCI procedure well. There were no immediate procedural complications. The patient was transferred to the post catheterization recovery area for further monitoring.   PCI Data:  Vessel - RCA/Segment - mid  Percent Stenosis (pre) 99%  TIMI-flow 3  Stent 3.5 x 12 mm Veriflex  Percent Stenosis (post) 0%  TIMI-flow (post) 3   Final Conclusions:  1. Single vessel obstructive coronary disease with culprit lesion in the mid RCA.  2. Normal LV function.  3. Successful stenting of the mid RCA with a bare-metal stents.   Recommendations: Continue dual antiplatelet therapy for one year. Patient will be admitted to fast track and may be eligible for discharge in 48 hours if his course is stable.  [facility]  02/15/2012, 2:02 AM  Assessment / Plan: 1. Recent STEMI - s/p BMS to the RCA. He is on Plavix/Aspirin for one year given his MI. He looks to be doing well. No change in his current regimen. EF was normal at 55 to 65%.   2. Tobacco abuse - not smoking with the help of Chantix. Hopefully he will be able to abstain. Seems to be tolerating ok.   3. HLD - on statin therapy  4. COPD - I have held off on starting beta blocker therapy at this time.   I think overall he  is doing ok. No change in his medicines. Will see him back in 3 weeks.   Patient is agreeable to this plan and will call if any problems develop in the interim.

## 2012-02-25 NOTE — Patient Instructions (Addendum)
I will see you in 3 weeks. We will check fasting labs at that visit  I congratulate you for not smoking  Stay on your current medicines  Call the [facility] office at [phone] if you have any questions, problems or concerns.

## 2012-03-07 ENCOUNTER — Telehealth: Payer: Self-pay | Admitting: Cardiology

## 2012-03-07 ENCOUNTER — Telehealth: Payer: Self-pay | Admitting: Critical Care Medicine

## 2012-03-07 NOTE — Telephone Encounter (Signed)
New problem    Daughter calling.    C/o side effect plavix  75 mg . Chest burns.

## 2012-03-07 NOTE — Telephone Encounter (Signed)
See previous 03/07/12 note.

## 2012-03-07 NOTE — Telephone Encounter (Signed)
Spoke with patient's daughter she stated patient is unable to take plavix.States makes father sick,nausea,chest burns,weak.States takes plavix with food.States has been sick after taking plavix for 1 week.Daughter was told Dr.Jordan not in office will send him message for his advice.

## 2012-03-07 NOTE — Telephone Encounter (Signed)
Follow-up:    Patient returned your call.  Please call back. 

## 2012-03-07 NOTE — Telephone Encounter (Signed)
Called and spoke with pts daughter and she was confused about who she had called.  i explained to her that PW is the pts pulmonologist.  Daughter stated that the pt had an MI a couple of weeks ago and was in the hospital and started on several different meds.  She stated that the plavix that the pt is taking has been causing some burning in his chest.  Pt would like to stop this medication. i advised the daughter that he should not stop this medication without talking with cardiology first.  Daughter stated that she has lm at the office for cardiology as well but wanted to make PW aware.  Will forward this message to PW.  Daughter stated that she will call cardiology back. Nothing further is needed.

## 2012-03-07 NOTE — Telephone Encounter (Signed)
He had stenting of the RCA with a bare metal stent on 02/15/12. Ideally would like him to take dual antiplatelet therapy for one year. Given intolerance we could stop Plavix at one month- so I would have him take until 03/14/12 then discontinue. Take protonix for GI upset.  Danean Marner Swaz[place]and MD, [facility]

## 2012-03-07 NOTE — Telephone Encounter (Signed)
Spoke with patient's daughter was told received message from Dr.Jordan he advised ideally needs to take plavix for 1 year but given the intolerance may stop plavix 03/14/12.Advised to take protonix 40 mg daily for GI upset.

## 2012-03-07 NOTE — Telephone Encounter (Signed)
Patient's daughter called no answer.LMTC.

## 2012-03-10 ENCOUNTER — Other Ambulatory Visit: Payer: Self-pay | Admitting: Critical Care Medicine

## 2012-03-10 ENCOUNTER — Other Ambulatory Visit (HOSPITAL_COMMUNITY): Payer: Self-pay | Admitting: Physician Assistant

## 2012-03-14 ENCOUNTER — Other Ambulatory Visit: Payer: Self-pay | Admitting: Critical Care Medicine

## 2012-03-17 ENCOUNTER — Ambulatory Visit (INDEPENDENT_AMBULATORY_CARE_PROVIDER_SITE_OTHER): Payer: Medicare HMO | Admitting: Nurse Practitioner

## 2012-03-17 ENCOUNTER — Ambulatory Visit (INDEPENDENT_AMBULATORY_CARE_PROVIDER_SITE_OTHER): Payer: Medicare HMO | Admitting: *Deleted

## 2012-03-17 ENCOUNTER — Encounter: Payer: Self-pay | Admitting: Nurse Practitioner

## 2012-03-17 VITALS — BP 130/70 | HR 72 | Ht <= 58 in | Wt 132.4 lb

## 2012-03-17 DIAGNOSIS — I219 Acute myocardial infarction, unspecified: Secondary | ICD-10-CM

## 2012-03-17 DIAGNOSIS — E785 Hyperlipidemia, unspecified: Secondary | ICD-10-CM

## 2012-03-17 DIAGNOSIS — R0989 Other specified symptoms and signs involving the circulatory and respiratory systems: Secondary | ICD-10-CM

## 2012-03-17 DIAGNOSIS — I213 ST elevation (STEMI) myocardial infarction of unspecified site: Secondary | ICD-10-CM

## 2012-03-17 LAB — LIPID PANEL
Cholesterol: 203 mg/dL — ABNORMAL HIGH (ref 0–200)
HDL: 42.3 mg/dL (ref >39.00)
Total CHOL/HDL Ratio: 5
Triglycerides: 125 mg/dL (ref 0.0–149.0)
VLDL: 25 mg/dL (ref 0.0–40.0)

## 2012-03-17 LAB — LDL CHOLESTEROL, DIRECT: Direct LDL: 142.6 mg/dL

## 2012-03-17 MED ORDER — OMEPRAZOLE 20 MG PO CPDR: 20.0000 mg | DELAYED_RELEASE_CAPSULE | Freq: Every day | ORAL | Status: DC

## 2012-03-17 NOTE — Patient Instructions (Addendum)
I'm glad you are not smoking  Stay on your current medicines - you can get back on your Omeprazole like you were taking before.  Keep walking  Checking labs today  See Dr. Swaziland in 3 months.  Call the [facility] office at [phone] if you have any questions, problems or concerns.

## 2012-03-17 NOTE — Addendum Note (Signed)
Addended by: Tonita Phoenix on: 03/17/2012 09:59 AM   Modules accepted: Orders

## 2012-03-17 NOTE — Progress Notes (Signed)
[name] Date of Birth: [birth year] Medical Record #[id number]  History of Present Illness: [name] is seen back today for a 3 week check. He is seen for Dr. Swaziland. He has had a recent STEMI with BMS to the RCA (02/15/12). EF was normal. Other issues include HLD, HTN, tobacco abuse, COPD and GERD. He was to be on Plavix for a year.  I saw him 3 weeks ago. He was not smoking. Doing ok.  He comes back today. He is here with the interpreter. Has not tolerated the Plavix due to GI upset. This was stopped on 03/14/12. Told to take Protonix for GI upset. He says this made him feel bad - said it made him "burn up all over". He is off the Protonix and the Plavix. Now feels fine. Wants to get back to Omeprazole. Says he is not having any heart pain. He is walking daily.   Current Outpatient Prescriptions on File Prior to Visit  Medication Sig Dispense Refill  . albuterol (PROVENTIL HFA;VENTOLIN HFA) 108 (90 BASE) MCG/ACT inhaler Inhale 2 puffs into the lungs every 6 (six) hours as needed. For shortness of breath      . albuterol (PROVENTIL) (2.5 MG/3ML) 0.083% nebulizer solution Take 2.5 mg by nebulization 2 (two) times daily. And as needed. Dx 496,  FILE MCR PART B      . aspirin 81 MG tablet Take 81 mg by mouth daily as needed.       [name] atorvastatin (LIPITOR) 80 MG tablet Take 1 tablet (80 mg total) by mouth daily at 6 PM.  30 tablet  3  . beclomethasone (QVAR) 80 MCG/ACT inhaler Inhale 2 puffs into the lungs 2 (two) times daily.  1 Inhaler  4  . lisinopril (PRINIVIL,ZESTRIL) 5 MG tablet Take 1 tablet (5 mg total) by mouth daily.  30 tablet  3  . nitroGLYCERIN (NITROSTAT) 0.4 MG SL tablet Place 1 tablet (0.4 mg total) under the tongue every 5 (five) minutes as needed for chest pain.  25 tablet  3  . SPIRIVA HANDIHALER 18 MCG inhalation capsule INHALE 1 CAPSULE ONCE DAILY  30 capsule  6  . [DISCONTINUED] arformoterol (BROVANA) 15 MCG/2ML NEBU Take 2 mLs (15 mcg total) by nebulization 2 (two) times daily.  Dx code: 496  120 mL  11  . [DISCONTINUED] budesonide (PULMICORT) 0.5 MG/2ML nebulizer solution Take 2 mLs (0.5 mg total) by nebulization 2 (two) times daily. DX:  496  120 mL  11  . [DISCONTINUED] esomeprazole (NEXIUM) 40 MG capsule Take 1 capsule (40 mg total) by mouth daily.  30 capsule  6   No current facility-administered medications on file prior to visit.    No Known Allergies  Past Medical History  Diagnosis Date  . COPD (chronic obstructive pulmonary disease)     former smoker  . GERD (gastroesophageal reflux disease)   . CAD (coronary artery disease), native coronary artery 6/11, 2/14    a. thought due to vasospasm . cath - CAD LAD 40-50%, LCX 40%, RCA 25% EF 65%. lexiscan myoview EF 68% w/o evidence of ischemia or infarct b. STEMI s/p BMS-mid RCA  . HTN (hypertension)   . Asthma   . Hyperlipidemia   . Tobacco abuse   . Allergic rhinitis     Past Surgical History  Procedure Laterality Date  . Left foot surgery      repair - and ankle   . Coronary angioplasty with stent placement  02/15/12    20% distal  LM, 30-40% mid LAD, 50% ostial diag, 50-60% mid LCx, 99% focal mid RCA stenosis s/p BMS; 20% distal RCA disease, 50% prox PLB; LVEF 55-65%    History  Smoking status  . Former Smoker -- 1.00 packs/day for 50 years  . Types: Cigarettes  . Quit date: 01/09/2008  Smokeless tobacco  . Never Used    Comment: rolled own cigarettes since age of 70,     History  Alcohol Use No    Comment: occassional     History reviewed. No pertinent family history.  Review of Systems: The review of systems is per the HPI.  All other systems were reviewed and are negative.  Physical Exam: BP 130/70  Pulse 72  Ht 4\' 7"  (1.397 m)  Wt 132 lb 6.4 oz (60.056 kg)  BMI 30.77 kg/m2 Patient is pleasant and in no acute distress. Skin is warm and dry. Color is normal.  HEENT is unremarkable. Normocephalic/atraumatic. PERRL. Sclera are nonicteric. Neck is supple. No masses. No JVD. Lungs  are clear. Cardiac exam shows a regular rate and rhythm. Abdomen is soft. Extremities are without edema. Gait and ROM are intact. No gross neurologic deficits noted.  LABORATORY DATA: Pending for today    Chemistry      Component Value Date/Time   NA 138 02/16/2012 0550   K 4.1 02/16/2012 0550   CL 102 02/16/2012 0550   CO2 24 02/16/2012 0550   BUN 20 02/16/2012 0550   CREATININE 0.87 02/16/2012 0550      Component Value Date/Time   CALCIUM 8.9 02/16/2012 0550   ALKPHOS 66 02/15/2012 0108   AST 18 02/15/2012 0108   ALT 17 02/15/2012 0108   BILITOT 0.2* 02/15/2012 0108     Lab Results  Component Value Date   CHOL 199 02/16/2012   HDL 48 02/16/2012   LDLCALC 213* 02/16/2012   TRIG 86 02/16/2012   CHOLHDL 4.1 02/16/2012    Assessment / Plan: 1. Recent STEMI - one month out from his event/PCI - no longer on Plavix due to intolerance. Doing ok.   2. GERD - he wants to get back on his Omeprazole that he was taking before. This is ok. No longer on Plavix now.   3. Tobacco abuse - not smoking.  4. HLD - checking labs today.  Will see Dr. Swaziland back in 3 months.   Patient is agreeable to this plan and will call if any problems develop in the interim.

## 2012-03-18 ENCOUNTER — Other Ambulatory Visit: Payer: Self-pay | Admitting: *Deleted

## 2012-03-18 ENCOUNTER — Encounter: Payer: Self-pay | Admitting: *Deleted

## 2012-03-18 DIAGNOSIS — E785 Hyperlipidemia, unspecified: Secondary | ICD-10-CM

## 2012-03-18 MED ORDER — PRAVASTATIN SODIUM 20 MG PO TABS: 20.0000 mg | ORAL_TABLET | Freq: Every evening | ORAL | Status: DC

## 2012-03-18 MED ORDER — VARENICLINE TARTRATE 0.5 MG PO TABS: 0.5000 mg | ORAL_TABLET | Freq: Two times a day (BID) | ORAL | Status: DC

## 2012-03-18 MED ORDER — VARENICLINE TARTRATE 1 MG PO TABS: 1.0000 mg | ORAL_TABLET | Freq: Two times a day (BID) | ORAL | Status: DC

## 2012-03-18 NOTE — Telephone Encounter (Signed)
Spoke to pharmacist at [facility] chantix starting pk refilled for 30 days then continuing pack refilled for 60 days.

## 2012-03-29 ENCOUNTER — Other Ambulatory Visit: Payer: Self-pay | Admitting: Critical Care Medicine

## 2012-04-21 ENCOUNTER — Ambulatory Visit: Payer: Self-pay | Admitting: Nurse Practitioner

## 2012-05-05 ENCOUNTER — Other Ambulatory Visit: Payer: Self-pay | Admitting: *Deleted

## 2012-05-05 ENCOUNTER — Telehealth: Payer: Self-pay | Admitting: *Deleted

## 2012-05-05 DIAGNOSIS — E785 Hyperlipidemia, unspecified: Secondary | ICD-10-CM

## 2012-05-05 NOTE — Telephone Encounter (Signed)
Pt showed up today and wanted to know what pt letter said about 4/29, I s/w pt and explained tomorrow pt comes in for a lipid profile and pt stated not taking pravachol ran out and never refilled it. I stated if pt not taking med we will have to wait to do lab work, we will do lab work on visit with Dr. Thomasene Lot 06/18/12. Pt speaks very little english, I asked to call someone I could t/w and pt said there is no one I put in order today for lab and made [facility] aware of pt.

## 2012-05-27 ENCOUNTER — Encounter: Payer: Self-pay | Admitting: Critical Care Medicine

## 2012-05-27 ENCOUNTER — Ambulatory Visit (INDEPENDENT_AMBULATORY_CARE_PROVIDER_SITE_OTHER): Payer: Medicare HMO | Admitting: Critical Care Medicine

## 2012-05-27 ENCOUNTER — Telehealth: Payer: Self-pay | Admitting: Critical Care Medicine

## 2012-05-27 VITALS — BP 116/80 | HR 75 | Temp 98.1°F | Ht <= 58 in | Wt 138.0 lb

## 2012-05-27 DIAGNOSIS — J449 Chronic obstructive pulmonary disease, unspecified: Secondary | ICD-10-CM

## 2012-05-27 NOTE — Assessment & Plan Note (Signed)
Chronic obstructive lung disease gold stage C. Stable at this time Plan Maintain inhaled medications as prescribed Return 4 months

## 2012-05-27 NOTE — Patient Instructions (Signed)
No change in medications. Return in         4 months 

## 2012-05-27 NOTE — Progress Notes (Signed)
Subjective:    Patient ID: [name], male    DOB: April 16, 1945, 67 y.o.   MRN: [id number]  HPI   67 y.o.   Asian male patient with a known history of chronic obstructive pulmonary disease with FEV1 of 63% associated CAD non obstructed with recent Non STEMI 6/11. Hyperlipidemia and HTN    05/27/2012 Does well if on meds. Pt coughs up white mucus.  No real chest pain.  No edema in feet.  Occ chest pain and uses NTG. THis helps.  Not often the pain.   Past Medical History  Diagnosis Date  . COPD (chronic obstructive pulmonary disease)     former smoker  . GERD (gastroesophageal reflux disease)   . CAD (coronary artery disease), native coronary artery 6/11, 2/14    a. thought due to vasospasm . cath - CAD LAD 40-50%, LCX 40%, RCA 25% EF 65%. lexiscan myoview EF 68% w/o evidence of ischemia or infarct b. STEMI s/p BMS-mid RCA  . HTN (hypertension)   . Asthma   . Hyperlipidemia   . Tobacco abuse   . Allergic rhinitis      No family history on file.   History   Social History  . Marital Status: Married    Spouse Name: N/A    Number of Children: N/A  . Years of Education: N/A   Occupational History  . Not on file.   Social History Main Topics  . Smoking status: Former Smoker -- 1.00 packs/day for 50 years    Types: Cigarettes    Quit date: 01/09/2008  . Smokeless tobacco: Never Used     Comment: rolled own cigarettes since age of 60,   . Alcohol Use: No     Comment: occassional   . Drug Use: No  . Sexually Active: Not Currently     Comment: pt sts he only smoke 1 cigarette, not every day   Other Topics Concern  . Not on file   Social History Narrative   Married, 4 children.   Disability secondary to lung disease.            No Known Allergies   Outpatient Prescriptions Prior to Visit  Medication Sig Dispense Refill  . albuterol (PROVENTIL HFA;VENTOLIN HFA) 108 (90 BASE) MCG/ACT inhaler Inhale 2 puffs into the lungs every 6 (six) hours as needed. For shortness  of breath      . albuterol (PROVENTIL) (2.5 MG/3ML) 0.083% nebulizer solution Take 2.5 mg by nebulization 2 (two) times daily. And as needed. Dx 496,  FILE MCR PART B      . aspirin 81 MG tablet Take 81 mg by mouth daily as needed.       . nitroGLYCERIN (NITROSTAT) 0.4 MG SL tablet Place 1 tablet (0.4 mg total) under the tongue every 5 (five) minutes as needed for chest pain.  25 tablet  3  . potassium chloride SA (K-DUR,KLOR-CON) 20 MEQ tablet Take 20 mEq by mouth daily.      . pravastatin (PRAVACHOL) 20 MG tablet Take 1 tablet (20 mg total) by mouth every evening.  30 tablet  6  . QVAR 80 MCG/ACT inhaler INHALE 2 PUFFS INTO THE LUNGS 2 (TWO) TIMES DAILY.  8.7 g  5  . SPIRIVA HANDIHALER 18 MCG inhalation capsule INHALE 1 CAPSULE ONCE DAILY  30 capsule  6  . amLODipine (NORVASC) 5 MG tablet Take 5 mg by mouth daily.      [name] atorvastatin (LIPITOR) 80 MG tablet Take 1 tablet (  80 mg total) by mouth daily at 6 PM.  30 tablet  3  . lisinopril (PRINIVIL,ZESTRIL) 5 MG tablet Take 1 tablet (5 mg total) by mouth daily.  30 tablet  3  . omeprazole (PRILOSEC) 20 MG capsule Take 1 capsule (20 mg total) by mouth daily.  30 capsule  11  . varenicline (CHANTIX CONTINUING MONTH PAK) 1 MG tablet Take 1 tablet (1 mg total) by mouth 2 (two) times daily.  30 tablet  1  . varenicline (CHANTIX) 0.5 MG tablet Take 1 tablet (0.5 mg total) by mouth 2 (two) times daily.  30 tablet  0   No facility-administered medications prior to visit.      Review of Systems  Constitutional:   No  weight loss, night sweats,  Fevers, chills, + fatigue, lassitude. HEENT:   No headaches,  Difficulty swallowing,  Tooth/dental problems,  Sore throat,                No sneezing, itching, ear ache, nasal congestion, post nasal drip,   CV:  No chest pain,  Orthopnea, PND, swelling in lower extremities, anasarca, dizziness, palpitations  GI  No heartburn, indigestion, abdominal pain, nausea, vomiting, diarrhea, change in bowel habits,  loss of appetite  Resp:  .  No excess mucus, no productive cough,  No non-productive cough,  No coughing up of blood.  No change in color of mucus.  No wheezing.  No chest wall deformity  Skin: no rash or lesions.  GU: no dysuria, change in color of urine, no urgency or frequency.  No flank pain.  MS:  No joint pain or swelling.  No decreased range of motion.  No back pain.  Psych:  No change in mood or affect. No depression or anxiety.  No memory loss.     Objective:   Physical Exam   Filed Vitals:   05/27/12 0849  BP: 116/80  Pulse: 75  Temp: 98.1 F (36.7 C)  TempSrc: Oral  Height: 4\' 9"  (1.448 m)  Weight: 138 lb (62.596 kg)  SpO2: 99%    Gen: Pleasant, well-nourished, in no distress,  normal affect   ENT: No lesions,  mouth clear,  oropharynx clear, no postnasal drip  Neck: No JVD, no TMG, no carotid bruits  Lungs: No use of accessory muscles, no dullness to percussion, distant BS, no wheezing  Cardiovascular: RRR, heart sounds normal, no murmur or gallops, no peripheral edema  Abdomen: soft and NT, no HSM,  BS normal  Musculoskeletal: No deformities, no cyanosis or clubbing  Neuro: alert, non focal  Skin: Warm, no lesions or rashes      Assessment & Plan:   COPD Gold C Chronic obstructive lung disease gold stage C. Stable at this time Plan Maintain inhaled medications as prescribed Return 4 months    Updated Medication List Outpatient Encounter Prescriptions as of 05/27/2012  Medication Sig Dispense Refill  . albuterol (PROVENTIL HFA;VENTOLIN HFA) 108 (90 BASE) MCG/ACT inhaler Inhale 2 puffs into the lungs every 6 (six) hours as needed. For shortness of breath      . albuterol (PROVENTIL) (2.5 MG/3ML) 0.083% nebulizer solution Take 2.5 mg by nebulization 2 (two) times daily. And as needed. Dx 496,  FILE MCR PART B      . aspirin 81 MG tablet Take 81 mg by mouth daily as needed.       . nitroGLYCERIN (NITROSTAT) 0.4 MG SL tablet Place 1 tablet (0.4  mg total) under the tongue every 5 (  five) minutes as needed for chest pain.  25 tablet  3  . potassium chloride SA (K-DUR,KLOR-CON) 20 MEQ tablet Take 20 mEq by mouth daily.      . pravastatin (PRAVACHOL) 20 MG tablet Take 1 tablet (20 mg total) by mouth every evening.  30 tablet  6  . predniSONE (DELTASONE) 10 MG tablet Take 10 mg by mouth daily.      [name] QVAR 80 MCG/ACT inhaler INHALE 2 PUFFS INTO THE LUNGS 2 (TWO) TIMES DAILY.  8.7 g  5  . SPIRIVA HANDIHALER 18 MCG inhalation capsule INHALE 1 CAPSULE ONCE DAILY  30 capsule  6  . amLODipine (NORVASC) 5 MG tablet Take 5 mg by mouth daily.      . [DISCONTINUED] atorvastatin (LIPITOR) 80 MG tablet Take 1 tablet (80 mg total) by mouth daily at 6 PM.  30 tablet  3  . [DISCONTINUED] lisinopril (PRINIVIL,ZESTRIL) 5 MG tablet Take 1 tablet (5 mg total) by mouth daily.  30 tablet  3  . [DISCONTINUED] omeprazole (PRILOSEC) 20 MG capsule Take 1 capsule (20 mg total) by mouth daily.  30 capsule  11  . [DISCONTINUED] varenicline (CHANTIX CONTINUING MONTH PAK) 1 MG tablet Take 1 tablet (1 mg total) by mouth 2 (two) times daily.  30 tablet  1  . [DISCONTINUED] varenicline (CHANTIX) 0.5 MG tablet Take 1 tablet (0.5 mg total) by mouth 2 (two) times daily.  30 tablet  0   No facility-administered encounter medications on file as of 05/27/2012.

## 2012-05-28 ENCOUNTER — Other Ambulatory Visit: Payer: Self-pay

## 2012-05-28 ENCOUNTER — Telehealth: Payer: Self-pay | Admitting: Critical Care Medicine

## 2012-05-28 MED ORDER — AMLODIPINE BESYLATE 5 MG PO TABS: 5.0000 mg | ORAL_TABLET | Freq: Every day | ORAL | Status: DC

## 2012-05-28 NOTE — Telephone Encounter (Signed)
i am ok to refill this med  

## 2012-05-28 NOTE — Telephone Encounter (Signed)
ATC patient x2 No answer, lmomtcb  Medication has already been sent to pharmacy

## 2012-05-28 NOTE — Telephone Encounter (Signed)
Empty encounter

## 2012-05-28 NOTE — Telephone Encounter (Signed)
I spoke with pt and he is requesting refill on amlodipine. I advised him Dr. Clarise Cruz has been refilling this for him and that who he needs to call for refill. Pt stated no Dr. Delford Field is his doctor and who has been refilling this. I advised him again our records show his cardiologists has been refill this. He stated if we don't want to refill this then he just want take it. I asked him if he has even called his cardiologists for the refill and he stated he has not. I attempted to give pt the phone # and he just keep repeating to me he did not understand. He asked that I ask Dr. Delford Field for the refill. Please advise Dr. Delford Field thanks

## 2012-05-29 NOTE — Telephone Encounter (Signed)
Pt aware. Jennifer Castillo, CMA  

## 2012-05-30 MED ORDER — AMLODIPINE BESYLATE 5 MG PO TABS: 5.0000 mg | ORAL_TABLET | Freq: Every day | ORAL | Status: DC

## 2012-05-30 NOTE — Addendum Note (Signed)
Addended by: Meda Klinefelter D on: 05/30/2012 04:48 PM   Modules accepted: Orders

## 2012-06-05 ENCOUNTER — Other Ambulatory Visit: Payer: Self-pay | Admitting: *Deleted

## 2012-06-05 MED ORDER — ALBUTEROL SULFATE HFA 108 (90 BASE) MCG/ACT IN AERS: 2.0000 | INHALATION_SPRAY | Freq: Four times a day (QID) | RESPIRATORY_TRACT | Status: DC | PRN

## 2012-06-18 ENCOUNTER — Ambulatory Visit (INDEPENDENT_AMBULATORY_CARE_PROVIDER_SITE_OTHER): Payer: Medicare HMO | Admitting: Cardiology

## 2012-06-18 ENCOUNTER — Encounter: Payer: Self-pay | Admitting: Cardiology

## 2012-06-18 ENCOUNTER — Other Ambulatory Visit (INDEPENDENT_AMBULATORY_CARE_PROVIDER_SITE_OTHER): Payer: Medicare HMO

## 2012-06-18 VITALS — BP 144/82 | HR 83 | Ht <= 58 in | Wt 133.1 lb

## 2012-06-18 DIAGNOSIS — I2510 Atherosclerotic heart disease of native coronary artery without angina pectoris: Secondary | ICD-10-CM

## 2012-06-18 DIAGNOSIS — Z720 Tobacco use: Secondary | ICD-10-CM

## 2012-06-18 DIAGNOSIS — K219 Gastro-esophageal reflux disease without esophagitis: Secondary | ICD-10-CM

## 2012-06-18 DIAGNOSIS — F17200 Nicotine dependence, unspecified, uncomplicated: Secondary | ICD-10-CM

## 2012-06-18 DIAGNOSIS — E785 Hyperlipidemia, unspecified: Secondary | ICD-10-CM

## 2012-06-18 LAB — HEPATIC FUNCTION PANEL
ALT: 24 U/L (ref 0–53)
AST: 24 U/L (ref 0–37)
Albumin: 3.9 g/dL (ref 3.5–5.2)
Alkaline Phosphatase: 56 U/L (ref 39–117)
Bilirubin, Direct: 0 mg/dL (ref 0.0–0.3)
Total Bilirubin: 0.6 mg/dL (ref 0.3–1.2)
Total Protein: 7.2 g/dL (ref 6.0–8.3)

## 2012-06-18 LAB — LIPID PANEL
Cholesterol: 156 mg/dL (ref 0–200)
HDL: 45.3 mg/dL (ref >39.00)
LDL Cholesterol: 89 mg/dL (ref 0–99)
Total CHOL/HDL Ratio: 3
Triglycerides: 111 mg/dL (ref 0.0–149.0)
VLDL: 22.2 mg/dL (ref 0.0–40.0)

## 2012-06-18 MED ORDER — NITROGLYCERIN 0.4 MG SL SUBL: 0.4000 mg | SUBLINGUAL_TABLET | SUBLINGUAL | Status: DC | PRN

## 2012-06-18 NOTE — Progress Notes (Signed)
[name] Date of Birth: Mar 11, 1945 Medical Record #[id number]  History of Present Illness: [name] is seen back today for followup of his coronary disease. 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 EF was normal. Other issues include HLD, HTN, tobacco abuse, COPD and GERD. He reports that he is no longer smoking. He is seen with an interpreter today. In general he states he feels a lot better. He has however been using sublingual nitroglycerin for symptoms of chest pain. This is worse with exertion and has occurred for times in the past month. He thinks it is worse over the past 2 weeks. This is sometimes associated with shortness of breath. He denies any significant reflux symptoms.  Current Outpatient Prescriptions on File Prior to Visit  Medication Sig Dispense Refill  . albuterol (PROVENTIL HFA;VENTOLIN HFA) 108 (90 BASE) MCG/ACT inhaler Inhale 2 puffs into the lungs every 6 (six) hours as needed.  3 Inhaler  1  . albuterol (PROVENTIL) (2.5 MG/3ML) 0.083% nebulizer solution Take 2.5 mg by nebulization 2 (two) times daily. And as needed. Dx 496,  FILE MCR PART B      . amLODipine (NORVASC) 5 MG tablet Take 1 tablet (5 mg total) by mouth daily.  30 tablet  11  . aspirin 81 MG tablet Take 81 mg by mouth daily as needed.       . potassium chloride SA (K-DUR,KLOR-CON) 20 MEQ tablet Take 20 mEq by mouth daily.      . pravastatin (PRAVACHOL) 20 MG tablet Take 1 tablet (20 mg total) by mouth every evening.  30 tablet  6  . predniSONE (DELTASONE) 10 MG tablet Take 10 mg by mouth daily.      [name] QVAR 80 MCG/ACT inhaler INHALE 2 PUFFS INTO THE LUNGS 2 (TWO) TIMES DAILY.  8.7 g  5  . SPIRIVA HANDIHALER 18 MCG inhalation capsule INHALE 1 CAPSULE ONCE DAILY  30 capsule  6  . [DISCONTINUED] arformoterol (BROVANA) 15 MCG/2ML NEBU Take 2 mLs (15 mcg total) by nebulization 2 (two) times daily. Dx code: 496  120 mL  11  . [DISCONTINUED] budesonide (PULMICORT) 0.5 MG/2ML nebulizer solution Take 2 mLs  (0.5 mg total) by nebulization 2 (two) times daily. DX:  496  120 mL  11  . [DISCONTINUED] esomeprazole (NEXIUM) 40 MG capsule Take 1 capsule (40 mg total) by mouth daily.  30 capsule  6   No current facility-administered medications on file prior to visit.    No Known Allergies  Past Medical History  Diagnosis Date  . COPD (chronic obstructive pulmonary disease)     former smoker  . GERD (gastroesophageal reflux disease)   . CAD (coronary artery disease), native coronary artery 6/11, 2/14    a. thought due to vasospasm . cath - CAD LAD 40-50%, LCX 40%, RCA 25% EF 65%. lexiscan myoview EF 68% w/o evidence of ischemia or infarct b. STEMI s/p BMS-mid RCA  . HTN (hypertension)   . Asthma   . Hyperlipidemia   . Tobacco abuse   . Allergic rhinitis     Past Surgical History  Procedure Laterality Date  . Left foot surgery      repair - and ankle   . Coronary angioplasty with stent placement  02/15/12    20% distal LM, 30-40% mid LAD, 50% ostial diag, 50-60% mid LCx, 99% focal mid RCA stenosis s/p BMS; 20% distal RCA disease, 50% prox PLB; LVEF 55-65%    History  Smoking status  .  Former Smoker -- 1.00 packs/day for 50 years  . Types: Cigarettes  . Quit date: 01/09/2008  Smokeless tobacco  . Never Used    Comment: rolled own cigarettes since age of 21,     History  Alcohol Use No    Comment: occassional     History reviewed. No pertinent family history.  Review of Systems: The review of systems is per the HPI.  All other systems were reviewed and are negative.  Physical Exam: BP 144/82  Pulse 83  Ht 4\' 9"  (1.448 m)  Wt 133 lb 1.9 oz (60.383 kg)  BMI 28.8 kg/m2  SpO2 95% Patient is pleasant and in no acute distress. Skin is warm and dry. Color is normal.  HEENT is unremarkable. Normocephalic/atraumatic. PERRL. Sclera are nonicteric. Neck is supple. No masses. No JVD. Lungs are clear. Cardiac exam shows a regular rate and rhythm. Abdomen is soft. Extremities are without  edema. Gait and ROM are intact. No gross neurologic deficits noted.  LABORATORY DATA: Pending for today    Chemistry      Component Value Date/Time   NA 138 02/16/2012 0550   K 4.1 02/16/2012 0550   CL 102 02/16/2012 0550   CO2 24 02/16/2012 0550   BUN 20 02/16/2012 0550   CREATININE 0.87 02/16/2012 0550      Component Value Date/Time   CALCIUM 8.9 02/16/2012 0550   ALKPHOS 66 02/15/2012 0108   AST 18 02/15/2012 0108   ALT 17 02/15/2012 0108   BILITOT 0.2* 02/15/2012 0108     Lab Results  Component Value Date   CHOL 203* 03/17/2012   HDL 42.30 03/17/2012   LDLCALC 134* 02/16/2012   LDLDIRECT 142.6 03/17/2012   TRIG 125.0 03/17/2012   CHOLHDL 5 03/17/2012    Assessment / Plan: 1. Status post inferior STEMI - status post stenting of the RCA with a bare-metal stent. Patient intolerant to Plavix due to to GI upset. He appears to be having some recurrent anginal symptoms. I recommended a stress Myoview to evaluate further.  2. GERD -well controlled on omeprazole.  3. Tobacco abuse - not smoking.  4. HLD - on high-dose statin therapy. Encourage dietary modification.  5. COPD-on chronic steroid therapy. Followed by Dr. Delford Field.

## 2012-06-18 NOTE — Patient Instructions (Signed)
We will schedule you for a nuclear stress test.  Continue your current medication   

## 2012-06-25 ENCOUNTER — Ambulatory Visit (HOSPITAL_COMMUNITY): Payer: Medicare HMO | Attending: Cardiology | Admitting: Radiology

## 2012-06-25 VITALS — BP 136/86 | Ht <= 58 in | Wt 134.0 lb

## 2012-06-25 DIAGNOSIS — K219 Gastro-esophageal reflux disease without esophagitis: Secondary | ICD-10-CM

## 2012-06-25 DIAGNOSIS — R0609 Other forms of dyspnea: Secondary | ICD-10-CM | POA: Insufficient documentation

## 2012-06-25 DIAGNOSIS — R079 Chest pain, unspecified: Secondary | ICD-10-CM | POA: Insufficient documentation

## 2012-06-25 DIAGNOSIS — I4891 Unspecified atrial fibrillation: Secondary | ICD-10-CM | POA: Insufficient documentation

## 2012-06-25 DIAGNOSIS — I10 Essential (primary) hypertension: Secondary | ICD-10-CM | POA: Insufficient documentation

## 2012-06-25 DIAGNOSIS — J449 Chronic obstructive pulmonary disease, unspecified: Secondary | ICD-10-CM | POA: Insufficient documentation

## 2012-06-25 DIAGNOSIS — I252 Old myocardial infarction: Secondary | ICD-10-CM | POA: Insufficient documentation

## 2012-06-25 DIAGNOSIS — R0789 Other chest pain: Secondary | ICD-10-CM

## 2012-06-25 DIAGNOSIS — E785 Hyperlipidemia, unspecified: Secondary | ICD-10-CM | POA: Insufficient documentation

## 2012-06-25 DIAGNOSIS — Z720 Tobacco use: Secondary | ICD-10-CM

## 2012-06-25 DIAGNOSIS — Z87891 Personal history of nicotine dependence: Secondary | ICD-10-CM | POA: Insufficient documentation

## 2012-06-25 DIAGNOSIS — I2510 Atherosclerotic heart disease of native coronary artery without angina pectoris: Secondary | ICD-10-CM | POA: Insufficient documentation

## 2012-06-25 DIAGNOSIS — R5381 Other malaise: Secondary | ICD-10-CM | POA: Insufficient documentation

## 2012-06-25 DIAGNOSIS — J4489 Other specified chronic obstructive pulmonary disease: Secondary | ICD-10-CM | POA: Insufficient documentation

## 2012-06-25 DIAGNOSIS — R5383 Other fatigue: Secondary | ICD-10-CM | POA: Insufficient documentation

## 2012-06-25 DIAGNOSIS — R0989 Other specified symptoms and signs involving the circulatory and respiratory systems: Secondary | ICD-10-CM | POA: Insufficient documentation

## 2012-06-25 DIAGNOSIS — R0602 Shortness of breath: Secondary | ICD-10-CM

## 2012-06-25 HISTORY — PX: NM MYOVIEW LTD: HXRAD82

## 2012-06-25 MED ORDER — TECHNETIUM TC 99M SESTAMIBI GENERIC - CARDIOLITE
10.0000 | Freq: Once | INTRAVENOUS | Status: AC | PRN
  Administered 2012-06-25: 10 via INTRAVENOUS

## 2012-06-25 MED ORDER — REGADENOSON 0.4 MG/5ML IV SOLN
0.4000 mg | Freq: Once | INTRAVENOUS | Status: AC
  Administered 2012-06-25: 0.4 mg via INTRAVENOUS

## 2012-06-25 MED ORDER — TECHNETIUM TC 99M SESTAMIBI GENERIC - CARDIOLITE
30.0000 | Freq: Once | INTRAVENOUS | Status: AC | PRN
  Administered 2012-06-25: 30 via INTRAVENOUS

## 2012-06-25 NOTE — Progress Notes (Signed)
[facility] SITE 3 NUCLEAR MED [address] [place], [place] [address] [phone]    Cardiology Nuclear Med Study  [name] is a 67 y.o. male     MRN : [id number]     DOB: 06-08-1945  Procedure Date: 06/25/2012  Nuclear Med Background Indication for Stress Test:  Evaluation for Ischemia History:  Asthma, COPD and H/O AFIB, '11 ECHO: EF:65% 2/14 MI-Heart Cath: EF: 55-65% mod CAD-STENT RCA, 06/22/09 MPS: EF; 68%  Cardiac Risk Factors: History of Smoking, Hypertension and Lipids  Symptoms:  Chest Pain, DOE, Fatigue and SOB   Nuclear Pre-Procedure Caffeine/Decaff Intake:  None NPO After: 8:00pm   Lungs:  clear O2 Sat: 98% on room air. IV 0.9% NS with Angio Cath:  22g  IV Site: R Hand  IV Started by:  Bonnita Levan, RN  Chest Size (in):  42 Cup Size: n/a  Height: 4\' 9"  (1.448 m)  Weight:  134 lb (60.782 kg)  BMI:  Body mass index is 28.99 kg/(m^2). Tech Comments:  This patient was scheduled for a treadmill test x2 and again had to be switched to a walking Lexiscan and again couldn't walk with that either. If this patient needs another stress test he needs to be scheduled for a sitting Lexiscan only. Aminophylline 75 mg given for the reversal of all symptoms.    Nuclear Med Study 1 or 2 day study: 1 day  Stress Test Type:  Stress  Reading MD: Marca Ancona, MD  Order Authorizing Provider:  Peter Swaziland, MD  Resting Radionuclide: Technetium 65m Sestamibi  Resting Radionuclide Dose: 11.0 mCi   Stress Radionuclide:  Technetium 65m Sestamibi  Stress Radionuclide Dose: 33.0 mCi           Stress Protocol Rest HR: 62 Stress HR: 100  Rest BP: 136/86 Stress BP: 149/90  Exercise Time (min): 4:26 METS: 4.60   Predicted Max HR: 153 bpm % Max HR: 65.36 bpm Rate Pressure Product: 21308   Dose of Adenosine (mg):  n/a Dose of Lexiscan: 0.4 mg  Dose of Atropine (mg): n/a Dose of Dobutamine: n/a mcg/kg/min (at max HR)  Stress Test Technologist: Milana Na, EMT-P  Nuclear  Technologist:  Domenic Polite, CNMT     Rest Procedure:  Myocardial perfusion imaging was performed at rest 45 minutes following the intravenous administration of Technetium 79m Sestamibi. Rest ECG: NSR - Normal EKG  Stress Procedure:  The patient received IV Lexiscan 0.4 mg over 15-seconds with concurrent low level exercise and then Technetium 91m Sestamibi was injected at 30-seconds while the patient continued walking one more minute. This patient was switched to a walking Lexiscan and became very sob with the Lexiscan injection. Quantitative spect images were obtained after a 45-minute delay. Stress ECG: No significant change from baseline ECG  QPS Raw Data Images:  Normal; no motion artifact; normal heart/lung ratio. Stress Images:  Medium-sized, mild basal to mid inferior perfusion defect.  Rest Images:  Small, mild basal to mid inferior perfusion defect.  Subtraction (SDS):  Primarily reversible, medium-sized basal to mid inferior perfusion defect.  Transient Ischemic Dilatation (Normal <1.22):  1.01 Lung/Heart Ratio (Normal <0.45):  0.25  Quantitative Gated Spect Images QGS EDV:  66 ml QGS ESV:  25 ml  Impression Exercise Capacity:  Lexiscan with low level exercise. BP Response:  Normal blood pressure response. Clinical Symptoms:  Severe dyspnea on treadmill.  ECG Impression:  No significant ST segment change suggestive of ischemia. Comparison with Prior Nuclear Study: No images to  compare  Overall Impression:  Intermediate risk stress nuclear study with a medium-sized, mild primarily reversible basal to mid inferior perfusion defect suggestive of ischemia. .  LV Ejection Fraction: 63%.  LV Wall Motion:  NL LV Function; NL Wall Motion  Marca Ancona 06/25/2012

## 2012-06-27 ENCOUNTER — Other Ambulatory Visit: Payer: Self-pay

## 2012-06-27 DIAGNOSIS — R079 Chest pain, unspecified: Secondary | ICD-10-CM

## 2012-06-30 ENCOUNTER — Other Ambulatory Visit (INDEPENDENT_AMBULATORY_CARE_PROVIDER_SITE_OTHER): Payer: Medicare HMO

## 2012-06-30 ENCOUNTER — Encounter: Payer: Self-pay | Admitting: *Deleted

## 2012-06-30 ENCOUNTER — Other Ambulatory Visit: Payer: Self-pay | Admitting: Cardiology

## 2012-06-30 DIAGNOSIS — R9439 Abnormal result of other cardiovascular function study: Secondary | ICD-10-CM

## 2012-06-30 DIAGNOSIS — I2510 Atherosclerotic heart disease of native coronary artery without angina pectoris: Secondary | ICD-10-CM | POA: Insufficient documentation

## 2012-06-30 DIAGNOSIS — R079 Chest pain, unspecified: Secondary | ICD-10-CM

## 2012-06-30 LAB — PROTIME-INR
INR: 1 ratio (ref 0.8–1.0)
Prothrombin Time: 10.8 s (ref 10.2–12.4)

## 2012-06-30 LAB — CBC WITH DIFFERENTIAL/PLATELET
Basophils Absolute: 0 10*3/uL (ref 0.0–0.1)
Eosinophils Absolute: 0.2 10*3/uL (ref 0.0–0.7)
Hemoglobin: 14.5 g/dL (ref 13.0–17.0)
Lymphocytes Relative: 29.2 % (ref 12.0–46.0)
MCHC: 33.5 g/dL (ref 30.0–36.0)
Neutro Abs: 4.1 10*3/uL (ref 1.4–7.7)
Neutrophils Relative %: 59.4 % (ref 43.0–77.0)
RDW: 13.9 % (ref 11.5–14.6)

## 2012-06-30 LAB — BASIC METABOLIC PANEL
BUN: 12 mg/dL (ref 6–23)
CO2: 27 mEq/L (ref 19–32)
Chloride: 106 mEq/L (ref 96–112)
Potassium: 3.2 mEq/L — ABNORMAL LOW (ref 3.5–5.1)

## 2012-07-01 ENCOUNTER — Ambulatory Visit (HOSPITAL_COMMUNITY)
Admission: RE | Admit: 2012-07-01 | Discharge: 2012-07-02 | Disposition: A | Discharge status: Home / Self Care | Payer: Medicare HMO | Source: Ambulatory Visit | Attending: Cardiology | Admitting: Cardiology

## 2012-07-01 ENCOUNTER — Encounter (HOSPITAL_COMMUNITY)
Admission: RE | Disposition: A | Discharge status: Home / Self Care | Payer: Self-pay | Source: Ambulatory Visit | Attending: Cardiology

## 2012-07-01 DIAGNOSIS — Z79899 Other long term (current) drug therapy: Secondary | ICD-10-CM | POA: Insufficient documentation

## 2012-07-01 DIAGNOSIS — K219 Gastro-esophageal reflux disease without esophagitis: Secondary | ICD-10-CM | POA: Diagnosis present

## 2012-07-01 DIAGNOSIS — I2510 Atherosclerotic heart disease of native coronary artery without angina pectoris: Secondary | ICD-10-CM

## 2012-07-01 DIAGNOSIS — J309 Allergic rhinitis, unspecified: Secondary | ICD-10-CM | POA: Insufficient documentation

## 2012-07-01 DIAGNOSIS — E876 Hypokalemia: Secondary | ICD-10-CM

## 2012-07-01 DIAGNOSIS — J441 Chronic obstructive pulmonary disease with (acute) exacerbation: Secondary | ICD-10-CM | POA: Diagnosis present

## 2012-07-01 DIAGNOSIS — Z955 Presence of coronary angioplasty implant and graft: Secondary | ICD-10-CM

## 2012-07-01 DIAGNOSIS — Z720 Tobacco use: Secondary | ICD-10-CM

## 2012-07-01 DIAGNOSIS — I10 Essential (primary) hypertension: Secondary | ICD-10-CM | POA: Diagnosis present

## 2012-07-01 DIAGNOSIS — R9439 Abnormal result of other cardiovascular function study: Secondary | ICD-10-CM

## 2012-07-01 DIAGNOSIS — J449 Chronic obstructive pulmonary disease, unspecified: Secondary | ICD-10-CM | POA: Insufficient documentation

## 2012-07-01 DIAGNOSIS — J4489 Other specified chronic obstructive pulmonary disease: Secondary | ICD-10-CM | POA: Insufficient documentation

## 2012-07-01 DIAGNOSIS — E1169 Type 2 diabetes mellitus with other specified complication: Secondary | ICD-10-CM | POA: Diagnosis present

## 2012-07-01 DIAGNOSIS — IMO0002 Reserved for concepts with insufficient information to code with codable children: Secondary | ICD-10-CM | POA: Insufficient documentation

## 2012-07-01 DIAGNOSIS — Z87891 Personal history of nicotine dependence: Secondary | ICD-10-CM | POA: Insufficient documentation

## 2012-07-01 DIAGNOSIS — F17200 Nicotine dependence, unspecified, uncomplicated: Secondary | ICD-10-CM | POA: Insufficient documentation

## 2012-07-01 DIAGNOSIS — E785 Hyperlipidemia, unspecified: Secondary | ICD-10-CM | POA: Insufficient documentation

## 2012-07-01 HISTORY — PX: LEFT HEART CATHETERIZATION WITH CORONARY ANGIOGRAM: SHX5451

## 2012-07-01 HISTORY — PX: PERCUTANEOUS CORONARY STENT INTERVENTION (PCI-S): SHX5485

## 2012-07-01 LAB — POCT ACTIVATED CLOTTING TIME: Activated Clotting Time: 555 seconds

## 2012-07-01 SURGERY — LEFT HEART CATHETERIZATION WITH CORONARY ANGIOGRAM
Anesthesia: LOCAL

## 2012-07-01 MED ORDER — POTASSIUM CHLORIDE CRYS ER 20 MEQ PO TBCR: 40.0000 meq | EXTENDED_RELEASE_TABLET | Freq: Once | ORAL | Status: DC

## 2012-07-01 MED ORDER — SODIUM CHLORIDE 0.9 % IV SOLN
250.0000 mL | INTRAVENOUS | Status: DC | PRN
Start: 2012-07-01 — End: 2012-07-01

## 2012-07-01 MED ORDER — ASPIRIN 81 MG PO CHEW
324.0000 mg | CHEWABLE_TABLET | ORAL | Status: AC
  Administered 2012-07-01: 324 mg via ORAL
  Filled 2012-07-01: qty 4

## 2012-07-01 MED ORDER — VERAPAMIL HCL 2.5 MG/ML IV SOLN
INTRAVENOUS | Status: AC
  Filled 2012-07-01: qty 2

## 2012-07-01 MED ORDER — SODIUM CHLORIDE 0.9 % IV SOLN
INTRAVENOUS | Status: DC
Start: 2012-07-02 — End: 2012-07-01
  Administered 2012-07-01: 10:00:00 via INTRAVENOUS

## 2012-07-01 MED ORDER — POTASSIUM CHLORIDE CRYS ER 20 MEQ PO TBCR
20.0000 meq | EXTENDED_RELEASE_TABLET | Freq: Every day | ORAL | Status: DC
  Administered 2012-07-01 – 2012-07-02 (×2): 20 meq via ORAL
  Filled 2012-07-01 (×2): qty 1

## 2012-07-01 MED ORDER — SODIUM CHLORIDE 0.9 % IV SOLN: 1.0000 mL/kg/h | INTRAVENOUS | Status: AC

## 2012-07-01 MED ORDER — TIOTROPIUM BROMIDE MONOHYDRATE 18 MCG IN CAPS
18.0000 ug | ORAL_CAPSULE | Freq: Every day | RESPIRATORY_TRACT | Status: DC
  Administered 2012-07-02: 08:00:00 18 ug via RESPIRATORY_TRACT
  Filled 2012-07-01: qty 5

## 2012-07-01 MED ORDER — FAMOTIDINE IN NACL 20-0.9 MG/50ML-% IV SOLN
INTRAVENOUS | Status: AC
  Filled 2012-07-01: qty 50

## 2012-07-01 MED ORDER — ALBUTEROL SULFATE (5 MG/ML) 0.5% IN NEBU: 2.5000 mg | INHALATION_SOLUTION | Freq: Four times a day (QID) | RESPIRATORY_TRACT | Status: DC | PRN

## 2012-07-01 MED ORDER — BIVALIRUDIN 250 MG IV SOLR
INTRAVENOUS | Status: AC
  Filled 2012-07-01: qty 250

## 2012-07-01 MED ORDER — LIDOCAINE HCL (PF) 1 % IJ SOLN
INTRAMUSCULAR | Status: AC
  Filled 2012-07-01: qty 30

## 2012-07-01 MED ORDER — SODIUM CHLORIDE 0.9 % IJ SOLN: 3.0000 mL | INTRAMUSCULAR | Status: DC | PRN

## 2012-07-01 MED ORDER — PREDNISONE 10 MG PO TABS
10.0000 mg | ORAL_TABLET | Freq: Every day | ORAL | Status: DC
  Administered 2012-07-02: 10 mg via ORAL
  Filled 2012-07-01: qty 1

## 2012-07-01 MED ORDER — ACETAMINOPHEN 325 MG PO TABS: 650.0000 mg | ORAL_TABLET | ORAL | Status: DC | PRN

## 2012-07-01 MED ORDER — HEPARIN (PORCINE) IN NACL 2-0.9 UNIT/ML-% IJ SOLN
INTRAMUSCULAR | Status: AC
  Filled 2012-07-01: qty 1000

## 2012-07-01 MED ORDER — AMLODIPINE BESYLATE 5 MG PO TABS
5.0000 mg | ORAL_TABLET | Freq: Every day | ORAL | Status: DC
  Administered 2012-07-01 – 2012-07-02 (×2): 5 mg via ORAL
  Filled 2012-07-01 (×2): qty 1

## 2012-07-01 MED ORDER — FAMOTIDINE IN NACL 20-0.9 MG/50ML-% IV SOLN
20.0000 mg | Freq: Two times a day (BID) | INTRAVENOUS | Status: DC
  Administered 2012-07-01: 20 mg via INTRAVENOUS

## 2012-07-01 MED ORDER — FLUTICASONE PROPIONATE HFA 44 MCG/ACT IN AERO
1.0000 | INHALATION_SPRAY | Freq: Two times a day (BID) | RESPIRATORY_TRACT | Status: DC
  Administered 2012-07-01 – 2012-07-02 (×2): 1 via RESPIRATORY_TRACT
  Filled 2012-07-01: qty 10.6

## 2012-07-01 MED ORDER — FENTANYL CITRATE 0.05 MG/ML IJ SOLN
INTRAMUSCULAR | Status: AC
  Filled 2012-07-01: qty 2

## 2012-07-01 MED ORDER — SIMVASTATIN 20 MG PO TABS
20.0000 mg | ORAL_TABLET | Freq: Every day | ORAL | Status: DC
  Administered 2012-07-01: 20 mg via ORAL
  Filled 2012-07-01 (×2): qty 1

## 2012-07-01 MED ORDER — ONDANSETRON HCL 4 MG/2ML IJ SOLN: 4.0000 mg | Freq: Four times a day (QID) | INTRAMUSCULAR | Status: DC | PRN

## 2012-07-01 MED ORDER — NITROGLYCERIN 0.2 MG/ML ON CALL CATH LAB
INTRAVENOUS | Status: AC
  Filled 2012-07-01: qty 1

## 2012-07-01 MED ORDER — HEPARIN SODIUM (PORCINE) 1000 UNIT/ML IJ SOLN
INTRAMUSCULAR | Status: AC
  Filled 2012-07-01: qty 1

## 2012-07-01 MED ORDER — SODIUM CHLORIDE 0.9 % IJ SOLN: 3.0000 mL | Freq: Two times a day (BID) | INTRAMUSCULAR | Status: DC

## 2012-07-01 MED ORDER — CLOPIDOGREL BISULFATE 300 MG PO TABS
ORAL_TABLET | ORAL | Status: AC
  Filled 2012-07-01: qty 2

## 2012-07-01 MED ORDER — CLOPIDOGREL BISULFATE 75 MG PO TABS
75.0000 mg | ORAL_TABLET | Freq: Every day | ORAL | Status: DC
  Administered 2012-07-02: 75 mg via ORAL
  Filled 2012-07-01: qty 1

## 2012-07-01 MED ORDER — ASPIRIN 81 MG PO TABS: 81.0000 mg | ORAL_TABLET | Freq: Every day | ORAL | Status: DC

## 2012-07-01 MED ORDER — FAMOTIDINE IN NACL 20-0.9 MG/50ML-% IV SOLN: 20.0000 mg | Freq: Two times a day (BID) | INTRAVENOUS | Status: DC

## 2012-07-01 MED ORDER — ASPIRIN EC 81 MG PO TBEC
81.0000 mg | DELAYED_RELEASE_TABLET | Freq: Every day | ORAL | Status: DC
  Administered 2012-07-02: 81 mg via ORAL
  Filled 2012-07-01: qty 1

## 2012-07-01 MED ORDER — ALBUTEROL SULFATE HFA 108 (90 BASE) MCG/ACT IN AERS
2.0000 | INHALATION_SPRAY | Freq: Four times a day (QID) | RESPIRATORY_TRACT | Status: DC | PRN
  Filled 2012-07-01: qty 6.7

## 2012-07-01 MED ORDER — NITROGLYCERIN 0.4 MG SL SUBL
0.4000 mg | SUBLINGUAL_TABLET | SUBLINGUAL | Status: DC | PRN
  Administered 2012-07-02 (×2): 0.4 mg via SUBLINGUAL
  Filled 2012-07-01: qty 25

## 2012-07-01 MED ORDER — MIDAZOLAM HCL 2 MG/2ML IJ SOLN
INTRAMUSCULAR | Status: AC
  Filled 2012-07-01: qty 2

## 2012-07-01 NOTE — H&P (Signed)
PREVIOUS OFFICE NOTE DRAFTED BY DR. Swaziland 06/18/12 ADDENDED TO REFLECT CURRENT PRESENTATION.   [name] Date of Birth: April 21, 1945 Medical Record #[id number]  History of Present Illness: [name] is seen back today for followup of his coronary disease. 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 EF was normal. Other issues include HLD, HTN, tobacco abuse, COPD and GERD. He reports that he is no longer smoking. He is seen with an interpreter today. In general he states he feels a lot better. He has however been using sublingual nitroglycerin for symptoms of chest pain. This is worse with exertion and has occurred for times in the past month. He thinks it is worse over the past 2 weeks. This is sometimes associated with shortness of breath. He denies any significant reflux symptoms.  ADDENDUM: The patients symptoms were concerning for recurrent angina, especially in the setting of Plavix intolerance due to GI upset. ETT Myoview was arranged on 06/25/12. This was ultimately switched to a Lexiscan study as the due to suboptimal exercise capacity. This was interpreted as intermediate risk with a medium-sized, mild primarily reversible basal to mid inferior perfusion defect suggestive of ischemia; LVEF 63%. Given these findings, the recommendation was made to proceed with repeat cardiac catheterization. The risks, benefits and details were discussed with the patient who agreed to proceed. He presents today for this procedure.    No current facility-administered medications on file prior to encounter.   Current Outpatient Prescriptions on File Prior to Encounter  Medication Sig Dispense Refill  . albuterol (PROVENTIL HFA;VENTOLIN HFA) 108 (90 BASE) MCG/ACT inhaler Inhale 2 puffs into the lungs every 6 (six) hours as needed.  3 Inhaler  1  . albuterol (PROVENTIL) (2.5 MG/3ML) 0.083% nebulizer solution Take 2.5 mg by nebulization 2 (two) times daily. And as needed. Dx 496,  FILE MCR PART B      .  amLODipine (NORVASC) 5 MG tablet Take 1 tablet (5 mg total) by mouth daily.  30 tablet  11  . aspirin 81 MG tablet Take 81 mg by mouth daily as needed.       . nitroGLYCERIN (NITROSTAT) 0.4 MG SL tablet Place 1 tablet (0.4 mg total) under the tongue every 5 (five) minutes as needed for chest pain.  25 tablet  11  . potassium chloride SA (K-DUR,KLOR-CON) 20 MEQ tablet Take 20 mEq by mouth daily.      . pravastatin (PRAVACHOL) 20 MG tablet Take 1 tablet (20 mg total) by mouth every evening.  30 tablet  6  . predniSONE (DELTASONE) 10 MG tablet Take 10 mg by mouth daily.      . [DISCONTINUED] arformoterol (BROVANA) 15 MCG/2ML NEBU Take 2 mLs (15 mcg total) by nebulization 2 (two) times daily. Dx code: 496  120 mL  11  . [DISCONTINUED] budesonide (PULMICORT) 0.5 MG/2ML nebulizer solution Take 2 mLs (0.5 mg total) by nebulization 2 (two) times daily. DX:  496  120 mL  11  . [DISCONTINUED] esomeprazole (NEXIUM) 40 MG capsule Take 1 capsule (40 mg total) by mouth daily.  30 capsule  6    No Known Allergies  Past Medical History  Diagnosis Date  . COPD (chronic obstructive pulmonary disease)     former smoker  . GERD (gastroesophageal reflux disease)   . CAD (coronary artery disease), native coronary artery 6/11, 2/14    a. thought due to vasospasm . cath - CAD LAD 40-50%, LCX 40%, RCA 25% EF 65%. lexiscan myoview EF  68% w/o evidence of ischemia or infarct b. STEMI s/p BMS-mid RCA  . HTN (hypertension)   . Asthma   . Hyperlipidemia   . Tobacco abuse   . Allergic rhinitis     Past Surgical History  Procedure Laterality Date  . Left foot surgery      repair - and ankle   . Coronary angioplasty with stent placement  02/15/12    20% distal LM, 30-40% mid LAD, 50% ostial diag, 50-60% mid LCx, 99% focal mid RCA stenosis s/p BMS; 20% distal RCA disease, 50% prox PLB; LVEF 55-65%   Family history:  Mother deceased. Father deceased. No cardiac family history.   History  Smoking status  . Former  Smoker -- 1.00 packs/day for 50 years  . Types: Cigarettes  . Quit date: 01/09/2008  Smokeless tobacco  . Never Used    Comment: rolled own cigarettes since age of 31,     History  Alcohol Use No    Comment: occassional    Review of Systems: The review of systems is per the HPI.  All other systems were reviewed and are negative.  Physical Exam: BP 135/83  Pulse 74  Temp(Src) 97.6 F (36.4 C) (Oral)  Resp 18  Ht 4\' 9"  (1.448 m)  Wt 60.782 kg (134 lb)  BMI 28.99 kg/m2  SpO2 99% Patient is pleasant and in no acute distress. Skin is warm and dry. Color is normal.  HEENT is unremarkable. Normocephalic/atraumatic. PERRL. Sclera are nonicteric. Neck is supple. No masses. No JVD. Lungs are clear. Cardiac exam shows a regular rate and rhythm. Abdomen is soft. Extremities are without edema. Gait and ROM are intact. No gross neurologic deficits noted.  LABORATORY DATA: Pending for today    Chemistry      Component Value Date/Time   NA 142 06/30/2012 1041   K 3.2* 06/30/2012 1041   CL 106 06/30/2012 1041   CO2 27 06/30/2012 1041   BUN 12 06/30/2012 1041   CREATININE 1.0 06/30/2012 1041      Component Value Date/Time   CALCIUM 8.8 06/30/2012 1041   ALKPHOS 56 06/18/2012 1028   AST 24 06/18/2012 1028   ALT 24 06/18/2012 1028   BILITOT 0.6 06/18/2012 1028     CBC    Component Value Date/Time   WBC 6.9 06/30/2012 1041   RBC 4.74 06/30/2012 1041   HGB 14.5 06/30/2012 1041   HCT 43.2 06/30/2012 1041   PLT 289.0 06/30/2012 1041   MCV 91.0 06/30/2012 1041   MCH 30.9 02/15/2012 0532   MCHC 33.5 06/30/2012 1041   RDW 13.9 06/30/2012 1041   LYMPHSABS 2.0 06/30/2012 1041   MONOABS 0.6 06/30/2012 1041   EOSABS 0.2 06/30/2012 1041   BASOSABS 0.0 06/30/2012 1041    EKG: NSR, 65 bpm, IVCD II, III, aVF, no ST/T changes  Assessment / Plan:  67yo male with PMHx s/f STEMI (s/p BMS-RCA), COPD, HLD, GERD and tobacco abuse who presents today for repeat cardiac catheterization in the setting of abnormal  stress test.   1. CAD, recent h/o STEMI s/p BMS-RCA 2. GERD 3. COPD on chronic steroid therapy 4. HLD 5. Tobacco abuse 6. Plavix intolerance 7. Hypokalemia  Will proceed with repeat cardiac catheterization today. Precath labwork indicated at K of 3.2. Will replete. Further plan to be determined by the interventionalist at the time of cath.  Patient seen and examined and history reviewed. Agree with above findings and plan.  Theron Arista [facility] 07/01/2012 12:33 PM

## 2012-07-01 NOTE — Interval H&P Note (Signed)
History and Physical Interval Note:  07/01/2012 12:34 PM  [name]  has presented today for surgery, with the diagnosis of abnoraml stress test  The various methods of treatment have been discussed with the patient and family. After consideration of risks, benefits and other options for treatment, the patient has consented to  Procedure(s): LEFT HEART CATHETERIZATION WITH CORONARY ANGIOGRAM (N/A) as a surgical intervention .  The patient's history has been reviewed, patient examined, no change in status, stable for surgery.  I have reviewed the patient's chart and labs.  Questions were answered to the patient's satisfaction.     Theron Arista [facility] 07/01/2012 12:34 PM

## 2012-07-01 NOTE — CV Procedure (Signed)
Cardiac Catheterization Procedure Note  Name: [name] MRN: [id number] DOB: 1945/07/05  Procedure: Left Heart Cath, Selective Coronary Angiography, LV angiography, PTCA and stenting of the proximal RCA and PLOM  Indication: 67 year old Asian male status post stenting of the mid RCA in February 2014. He presents now with class III angina. Stress Myoview study shows evidence of inferior wall ischemia. He has been 1 antianginal medication. He is intolerant to beta blockers.  Procedural Details:  The right wrist was prepped, draped, and anesthetized with 1% lidocaine. Using the modified Seldinger technique, a 5 French sheath was introduced into the right radial artery. 3 mg of verapamil was administered through the sheath, weight-based unfractionated heparin was administered intravenously. Standard Judkins catheters were used for selective coronary angiography and left ventriculography. Catheter exchanges were performed over an exchange length guidewire.  PROCEDURAL FINDINGS Hemodynamics: AO 131/70 with a mean of 99 mmHg LV 126/8 mmHg   Coronary angiography: Coronary dominance: right  Left mainstem: Normal.  Left anterior descending (LAD): The left anterior descending artery is a large vessel extending to the apex. It gives rise to a single moderate diagonal branch. There is 30% disease in the proximal and mid LAD.  Left circumflex (LCx): The left circumflex gives rise to a single marginal branch. It has mild disease in the mid vessel up to 20%.  Right coronary artery (RCA): The right coronary is a dominant vessel. In the proximal vessel there is a 90% focal stenosis. The stent in the mid vessel is widely patent. In the distal RCA there is diffuse 30-40% disease. The posterior lateral branch has a focal 80% stenosis proximally.  Left ventriculography: Left ventricular systolic function is normal, LVEF is estimated at 55-65%, there is no significant mitral regurgitation   PCI Note:   Following the diagnostic procedure, the decision was made to proceed with PCI. The radial sheath was upsized to a 6 [place]. Weight-based bivalirudin was given for anticoagulation. Plavix 600 mg was given orally. Once a therapeutic ACT was achieved, a 6 [place] FR4 guide catheter was inserted.  A pro-water coronary guidewire was used to cross the lesion.  The lesion in the proximal RCA was predilated with a 2.5 mm balloon.  The lesion was then stented with a 4.0 x 12 mm Veriflex stent.  The stent was postdilated with a 4.0 mm noncompliant balloon. At this point the lesion in the posterior lateral branch appear to be more severe following intracoronary nitroglycerin. We predilated this lesion also with a 2.5 mm balloon. The lesion was then stented with a 3.0 x 12 mm Veriflex stent. The stent was postdilated with a 3.0 mm noncompliant balloon.  Following PCI, there was 0% residual stenosis and TIMI-3 flow. Final angiography confirmed an excellent result. The patient tolerated the procedure well. There were no immediate procedural complications. A TR band was used for radial hemostasis. The patient was transferred to the post catheterization recovery area for further monitoring.  PCI Data: Vessel - RCA/Segment - proximal and PLOM Percent Stenosis (pre)  90%/80% TIMI-flow 3 Stent 4.0 x 12 mm and 3.0 x 12 mm Veriflex stents Percent Stenosis (post) 0% TIMI-flow (post) 3   Final Conclusions:   1. Severe single vessel obstructive coronary disease. New lesions in the proximal RCA and the PLOM branch. The stent in the mid RCA is still widely patent.  2. Normal LV function. 3. Successful stenting of the proximal RCA and the PLOM with bare-metal stents.  Recommendations:  Continue dual antiplatelet therapy for at  least one month.  Theron Arista [facility] 07/01/2012, 1:37 PM

## 2012-07-01 NOTE — Progress Notes (Signed)
TR BAND REMOVAL  LOCATION:  right radial  DEFLATED PER PROTOCOL:  yes  TIME BAND OFF / DRESSING APPLIED:   1700   SITE UPON ARRIVAL:   Level 0  SITE AFTER BAND REMOVAL:  Level 0  REVERSE ALLEN'S TEST:    positive  CIRCULATION SENSATION AND MOVEMENT:  Within Normal Limits  yes  COMMENTS:  Restriction for arm given by interpreter in cath lab per reportp

## 2012-07-02 ENCOUNTER — Encounter (HOSPITAL_COMMUNITY): Payer: Self-pay | Admitting: *Deleted

## 2012-07-02 DIAGNOSIS — K219 Gastro-esophageal reflux disease without esophagitis: Secondary | ICD-10-CM

## 2012-07-02 DIAGNOSIS — J449 Chronic obstructive pulmonary disease, unspecified: Secondary | ICD-10-CM

## 2012-07-02 DIAGNOSIS — E876 Hypokalemia: Secondary | ICD-10-CM

## 2012-07-02 DIAGNOSIS — R9439 Abnormal result of other cardiovascular function study: Secondary | ICD-10-CM

## 2012-07-02 DIAGNOSIS — I2510 Atherosclerotic heart disease of native coronary artery without angina pectoris: Secondary | ICD-10-CM

## 2012-07-02 DIAGNOSIS — F17200 Nicotine dependence, unspecified, uncomplicated: Secondary | ICD-10-CM

## 2012-07-02 DIAGNOSIS — I10 Essential (primary) hypertension: Secondary | ICD-10-CM

## 2012-07-02 LAB — CBC
Hemoglobin: 15.3 g/dL (ref 13.0–17.0)
MCHC: 34.4 g/dL (ref 30.0–36.0)
WBC: 8.2 10*3/uL (ref 4.0–10.5)

## 2012-07-02 LAB — BASIC METABOLIC PANEL
Chloride: 107 mEq/L (ref 96–112)
GFR calc Af Amer: >90 mL/min (ref >90)
GFR calc non Af Amer: 89 mL/min — ABNORMAL LOW (ref >90)
Glucose, Bld: 91 mg/dL (ref 70–99)
Potassium: 3.8 mEq/L (ref 3.5–5.1)
Sodium: 139 mEq/L (ref 135–145)

## 2012-07-02 MED ORDER — BISOPROLOL FUMARATE 5 MG PO TABS
5.0000 mg | ORAL_TABLET | Freq: Every day | ORAL | Status: DC
  Administered 2012-07-02: 5 mg via ORAL
  Filled 2012-07-02: qty 1

## 2012-07-02 MED ORDER — PANTOPRAZOLE SODIUM 40 MG PO TBEC: 40.0000 mg | DELAYED_RELEASE_TABLET | Freq: Every day | ORAL | Status: DC

## 2012-07-02 MED ORDER — ATORVASTATIN CALCIUM 20 MG PO TABS: 20.0000 mg | ORAL_TABLET | Freq: Every day | ORAL | Status: DC

## 2012-07-02 MED ORDER — CLOPIDOGREL BISULFATE 75 MG PO TABS: 75.0000 mg | ORAL_TABLET | Freq: Every day | ORAL | Status: DC

## 2012-07-02 MED ORDER — BISOPROLOL FUMARATE 5 MG PO TABS: 5.0000 mg | ORAL_TABLET | Freq: Every day | ORAL | Status: DC

## 2012-07-02 MED FILL — Sodium Chloride IV Soln 0.9%: INTRAVENOUS | Qty: 50 | Status: AC

## 2012-07-02 NOTE — Progress Notes (Signed)
CARDIAC REHAB PHASE I   PRE:  Rate/Rhythm: 75 SR  BP:  Supine:   Sitting: 151/74  Standing:    SaO2:   MODE:  Ambulation: 1000 ft   POST:  Rate/Rhythm: 88 SR  BP:  Supine:   Sitting: 166/69  Standing:    SaO2:  1610-9604 Pt tolerated ambulation well without c/o of cp or SOB. BP after walk 166/69. Reviewed stent discharge education with pt. Discussed plavix, walking, proper use of sl NTG, and Outpt. CRP. Pt states that he did not come last admission to cardiac rehab because he is to busy.He states that he will try to come this time. Will send referral to [facility] here for ambulation and education.   Melina Copa RN 07/02/2012 8:42 AM   \

## 2012-07-02 NOTE — Discharge Summary (Signed)
Discharge Summary   Patient ID: [name],  MRN: [id number], DOB/AGE: Aug 24, 1945 67 y.o.  Admit date: 07/01/2012 Discharge date: 07/02/2012  Primary Physician: No primary provider on file. Primary Cardiologist: P. [place], MD  Discharge Diagnoses Principal Problem:   Abnormal stress test   - Lexiscan Myoview 06/18/12 showed intermediate risk with a medium-sized, mild primarily reversible basal to mid inferior perfusion defect suggestive of ischemia; LVEF 63%  - Cardiac cath + PCI 07/01/12: 30% prox LAD, 30% mid LAD, 20% mid LCx, 90% prox RCA s/p BMS, patent mid RCA stent, 30-40% distal RCA, 80% PLB s/p BMS; EF 55-65%  - DAPT- ASA/Plavix x at least 1 month   - Continue use past this timeframe can be determined at follow-up Active Problems:   CAD  - Discharged on ASA/Plavix/bisoprolol (B1 selective)/statin/NTG SL PRN  - GI upset on Plavix in the past => Protonix added   COPD Gold C  - On chronic steroid therapy    GERD  - Protonix added    Hyperlipidemia  - Pravastatin replaced with moderate-dose atorvastatin  - Recommend checking LFTs, lipid panel in 6 weeks   Essential hypertension   Hypokalemia  - Repleted   Allergies No Known Allergies  Diagnostic Studies/Procedures  CARDIAC CATHETERIZATION + PERCUTANEOUS CORONARY INTERVENTION - 07/01/12  Hemodynamics:  AO 131/70 with a mean of 99 mmHg  LV 126/8 mmHg  Coronary angiography:  Coronary dominance: right  Left mainstem: Normal.  Left anterior descending (LAD): The left anterior descending artery is a large vessel extending to the apex. It gives rise to a single moderate diagonal branch. There is 30% disease in the proximal and mid LAD.  Left circumflex (LCx): The left circumflex gives rise to a single marginal branch. It has mild disease in the mid vessel up to 20%.  Right coronary artery (RCA): The right coronary is a dominant vessel. In the proximal vessel there is a 90% focal stenosis. The stent in the mid vessel is  widely patent. In the distal RCA there is diffuse 30-40% disease. The posterior lateral branch has a focal 80% stenosis proximally.  Left ventriculography: Left ventricular systolic function is normal, LVEF is estimated at 55-65%, there is no significant mitral regurgitation   PCI Data:  Vessel - RCA/Segment - proximal and PLOM  Percent Stenosis (pre) 90%/80%  TIMI-flow 3  Stent 4.0 x 12 mm and 3.0 x 12 mm Veriflex stents  Percent Stenosis (post) 0%  TIMI-flow (post) 3  History of Present Illness [name] is a 67 y.o. male who was hospitalized at [facility] on 07/01/12 with the above problem list.   He has a PMHx s/f CAD/recent h/o STEMI (s/p BMS-RCA), COPD, HLD, GERD and tobacco abuse who presented yesterday for repeat cardiac catheterization in the setting of class III angina and abnormal stress test.   He was mildly hypokalemic on arrival (K 3.2). This was repleted. He was informed, consented and prepped for cardiac catheterization which was accessed via the R radial artery and revealed the above details and resulted in the successful placement of two drug eluting stents to a 90% prox RCA and 80% PLOM branch, respectively. He tolerated the procedure well without complications and the recommendation was made to continue DAPT- ASA/Plavix x at least 12 months.   Hospital Course   He was transferred to CRU for overnight monitoring. He was asymptomatic overnight, but did develop substernal chest discomfort at rest relieved by NTG SL x 2 early this morning. EKG performed revealed  no ischemic changes. The patient had previously been intolerant to beta blockers given his steroid-dependent COPD. A B1 selective BB in bisoprolol was added for CAD and antianginal benefit. He was deemed stable for discharge. He also has a history of GI upset on Plavix. Protonix was added. He will be discharged on the medication regimen outlined below otherwise. He will follow-up in the office in 2-4 weeks. LFTs and  lipid panel should be obtained in 6 weeks given new statin therapy. This information, including post-cath instructions and activity restrictions, has been clearly outlined in the discharge AVS.   Discharge Vitals:  Blood pressure 151/74, pulse 77, temperature 98 F (36.7 C), temperature source Oral, resp. rate 20, height 4\' 9"  (1.448 m), weight 61.7 kg (136 lb 0.4 oz), SpO2 97.00%.   Labs: Recent Labs     06/30/12  1041  07/02/12  0530  WBC  6.9  8.2  HGB  14.5  15.3  HCT  43.2  44.5  MCV  91.0  87.8  PLT  289.0  255    Recent Labs Lab 06/30/12 1041 07/02/12 0530  NA 142 139  K 3.2* 3.8  CL 106 107  CO2 27 25  BUN 12 16  CREATININE 1.0 0.83  CALCIUM 8.8 8.5  GLUCOSE 108* 91   Disposition:  Discharge Orders   Future Appointments Provider Department Dept Phone   07/25/2012 9:00 AM Rosalio Macadamia, NP  [facility] [place]) [phone]   Future Orders Complete By Expires     Amb Referral to Cardiac Rehabilitation  As directed     Diet - low sodium heart healthy  As directed     Increase activity slowly  As directed           Follow-up Information   Follow up with Norma Fredrickson, NP On 07/25/2012. (At 9:00 AM for post-hospital cardiology follow-up. )    Contact information:   [address] [place] [address] [phone]       Discharge Medications:    Medication List    STOP taking these medications       omeprazole 20 MG capsule  Commonly known as:  PRILOSEC     pravastatin 20 MG tablet  Commonly known as:  PRAVACHOL      TAKE these medications       albuterol (2.5 MG/3ML) 0.083% nebulizer solution  Commonly known as:  PROVENTIL  Take 2.5 mg by nebulization 2 (two) times daily. And as needed. Dx 496,  FILE MCR PART B     albuterol 108 (90 BASE) MCG/ACT inhaler  Commonly known as:  PROVENTIL HFA;VENTOLIN HFA  Inhale 2 puffs into the lungs every 6 (six) hours as needed.     amLODipine 5 MG tablet  Commonly known  as:  NORVASC  Take 1 tablet (5 mg total) by mouth daily.     aspirin 81 MG tablet  Take 81 mg by mouth daily as needed.     atorvastatin 20 MG tablet  Commonly known as:  LIPITOR  Take 1 tablet (20 mg total) by mouth daily.     beclomethasone 80 MCG/ACT inhaler  Commonly known as:  QVAR  Inhale 2 puffs into the lungs 2 (two) times daily.     bisoprolol 5 MG tablet  Commonly known as:  ZEBETA  Take 1 tablet (5 mg total) by mouth daily.     clopidogrel 75 MG tablet  Commonly known as:  PLAVIX  Take 1 tablet (  75 mg total) by mouth daily with breakfast.     nitroGLYCERIN 0.4 MG SL tablet  Commonly known as:  NITROSTAT  Place 1 tablet (0.4 mg total) under the tongue every 5 (five) minutes as needed for chest pain.     pantoprazole 40 MG tablet  Commonly known as:  PROTONIX  Take 1 tablet (40 mg total) by mouth daily at 6 (six) AM.     potassium chloride SA 20 MEQ tablet  Commonly known as:  K-DUR,KLOR-CON  Take 20 mEq by mouth daily.     predniSONE 10 MG tablet  Commonly known as:  DELTASONE  Take 10 mg by mouth daily.     tiotropium 18 MCG inhalation capsule  Commonly known as:  SPIRIVA  Place 18 mcg into inhaler and inhale daily.       Outstanding Labs/Studies: LFTs, lipid panel in 6 weeks  Duration of Discharge Encounter: Greater than 30 minutes including physician time.  Signed, R. Hurman Horn, PA-C 07/02/2012, 11:47 AM

## 2012-07-02 NOTE — Progress Notes (Signed)
Patient discharge with interpreter at bedside  No complications.

## 2012-07-02 NOTE — Progress Notes (Addendum)
Patient Name: [name] Date of Encounter: 07/02/2012     Principal Problem:   Abnormal stress test Active Problems:   CAD   COPD Gold C   GERD   Hyperlipidemia   Essential hypertension   Hypokalemia    SUBJECTIVE: He had an episode of chest pain at rest, described as "burning" similar to prior angina and completely relieved by NTG SL x 2 ~ 0500 this morning. Chest pain free currently. Had been ambulating in room w/o incident.    OBJECTIVE  Filed Vitals:   07/01/12 2000 07/01/12 2037 07/02/12 0044 07/02/12 0551  BP: 130/67  136/76 141/82  Pulse: 73  69 69  Temp: 97.4 F (36.3 C)  97.8 F (36.6 C) 98.3 F (36.8 C)  TempSrc: Oral  Oral Oral  Resp: 17  20 20   Height:      Weight:   61.7 kg (136 lb 0.4 oz)   SpO2: 98% 98% 97% 96%    Intake/Output Summary (Last 24 hours) at 07/02/12 0724 Last data filed at 07/02/12 0556  Gross per 24 hour  Intake 534.21 ml  Output    600 ml  Net -65.79 ml   Weight change:   PHYSICAL EXAM  General: Well developed, well nourished, in no acute distress. Head: Normocephalic, atraumatic, sclera non-icteric, no xanthomas, nares are without discharge.  Neck: Supple without bruits or JVD. Lungs:  Distant lungs sound, increased AP diameter, resp regular and unlabored, CTA. Heart: RRR no s3, s4, or murmurs. Abdomen: Soft, non-tender, non-distended, BS + x 4.  Msk:  Strength and tone appears normal for age. Extremities: R radial site without evidence of swelling, ecchymosis, discharge or tenderness. No clubbing, cyanosis or edema. DP/PT/Radials 2+ and equal bilaterally. Neuro: Alert and oriented X 3. Moves all extremities spontaneously. Psych: Normal affect.  LABS:  Recent Labs     06/30/12  1041  07/02/12  0530  WBC  6.9  8.2  HGB  14.5  15.3  HCT  43.2  44.5  MCV  91.0  87.8  PLT  289.0  255   Recent Labs Lab 06/30/12 1041 07/02/12 0530  NA 142 139  K 3.2* 3.8  CL 106 107  CO2 27 25  BUN 12 16  CREATININE 1.0 0.83    CALCIUM 8.8 8.5  GLUCOSE 108* 91   TELE: NSR, 60-80 bpm  ECG at 0556 this AM: NSR, 69 bpm, subtle STE (< 0.5 mm) V1, V2 when compared to yesterday's tracings, isolated PVC, no ST/T changes in II, III, aVF  Radiology/Studies:  No results found.  Current Medications:  . amLODipine  5 mg Oral Daily  . aspirin EC  81 mg Oral Daily  . clopidogrel  75 mg Oral Q breakfast  . fluticasone  1 puff Inhalation BID  . potassium chloride SA  20 mEq Oral Daily  . predniSONE  10 mg Oral Daily  . simvastatin  20 mg Oral q1800  . tiotropium  18 mcg Inhalation Daily    ASSESSMENT AND PLAN:  67yo male with PMHx s/f CAD/recent h/o STEMI (s/p BMS-RCA), COPD, HLD, GERD and tobacco abuse who presented yesterday for repeat cardiac catheterization in the setting of class III angina and abnormal stress test.   1. Class III angina/abnormal stress test  2. CAD, recent h/o STEMI s/p BMS-RCA  3. GERD  4. COPD on chronic steroid therapy  5. HLD  6. Tobacco abuse  7. Plavix intolerance  8. Hypokalemia  9. Beta blocker intolerance  Cardiac catheterization yesterday revealed 30% prox LAD, 30% mid LAD, 20% mid LCx, 90% prox RCA s/p BMS, patent mid RCA stent, 30-40% distal RCA, 80% PLB s/p BMS; EF 55-65%. Recommended DAPT- ASA/Plavix x at least 1 month. The patient is intolerant to beta blockers. He has had GI upset on Plavix in the past. Add Protonix. He did have an episode of substernal chest pain this AM reminiscent of his typical angina, and relieved by NTG SL x 2. There were nonspecific EKG changes in the anteroseptal leads. LAD with minimal nonobstructive disease on cath yesterday. Will have Dr. [place] review. Currently chest pain free. There is a note of possible vasospasm in the patient's history. Consider adding CCB or long-acting nitrate. Continue ASA/Plavix/statin/NTG SL PRN. Normokalemic this AM.      Signed, R. Hurman Horn, PA-C 07/02/2012, 7:24 AM Patient seen and examined and history  reviewed. Agree with above findings and plan. Patient did have some chest burning this am relieved with sl NTG. Ecg without change. Just ambulated with cardiac Rehab without any difficulty. BP is elevated. Patient was not on beta blocker in the past due to history of COPD. I favor starting a selective beta blocker for BP and angina. Will start bisoprolol 5 mg daily. Continue amlodipine. May use Ntg sl prn. If no relief he is to come to ED. Will DC home today and follow up in office in 2 weeks. Stressed importance of smoking cessation.  Theron Arista [facility] 07/02/2012 8:30 AM

## 2012-07-02 NOTE — Discharge Summary (Signed)
Patient seen and examined and history reviewed. Agree with above findings and plan. See earlier rounding note.  Thedora Hinders 07/02/2012 3:54 PM

## 2012-07-25 ENCOUNTER — Encounter: Payer: Self-pay | Admitting: Nurse Practitioner

## 2012-07-25 ENCOUNTER — Ambulatory Visit (INDEPENDENT_AMBULATORY_CARE_PROVIDER_SITE_OTHER): Payer: Medicare HMO | Admitting: Nurse Practitioner

## 2012-07-25 VITALS — BP 160/78 | HR 60 | Ht 60.0 in | Wt 138.4 lb

## 2012-07-25 DIAGNOSIS — I2510 Atherosclerotic heart disease of native coronary artery without angina pectoris: Secondary | ICD-10-CM

## 2012-07-25 LAB — BASIC METABOLIC PANEL
BUN: 13 mg/dL (ref 6–23)
CO2: 28 mEq/L (ref 19–32)
Calcium: 8.9 mg/dL (ref 8.4–10.5)
Chloride: 105 mEq/L (ref 96–112)
Creatinine, Ser: 1 mg/dL (ref 0.4–1.5)
GFR: 80.95 mL/min (ref >60.00)
Glucose, Bld: 86 mg/dL (ref 70–99)
Potassium: 3.8 mEq/L (ref 3.5–5.1)
Sodium: 138 mEq/L (ref 135–145)

## 2012-07-25 LAB — CBC WITH DIFFERENTIAL/PLATELET
Basophils Absolute: 0 10*3/uL (ref 0.0–0.1)
Basophils Relative: 0.6 % (ref 0.0–3.0)
Eosinophils Absolute: 0.4 10*3/uL (ref 0.0–0.7)
Eosinophils Relative: 6.4 % — ABNORMAL HIGH (ref 0.0–5.0)
HCT: 44 % (ref 39.0–52.0)
Hemoglobin: 14.8 g/dL (ref 13.0–17.0)
Lymphocytes Relative: 34.7 % (ref 12.0–46.0)
Lymphs Abs: 2.1 10*3/uL (ref 0.7–4.0)
MCHC: 33.7 g/dL (ref 30.0–36.0)
MCV: 90.2 fl (ref 78.0–100.0)
Monocytes Absolute: 0.6 10*3/uL (ref 0.1–1.0)
Monocytes Relative: 9.1 % (ref 3.0–12.0)
Neutro Abs: 3 10*3/uL (ref 1.4–7.7)
Neutrophils Relative %: 49.2 % (ref 43.0–77.0)
Platelets: 305 10*3/uL (ref 150.0–400.0)
RBC: 4.88 Mil/uL (ref 4.22–5.81)
RDW: 13.5 % (ref 11.5–14.6)
WBC: 6.1 10*3/uL (ref 4.5–10.5)

## 2012-07-25 NOTE — Patient Instructions (Addendum)
Stay on your current medicines  We will see you in 4 weeks with fasting labs at that visit  We will check labs today  Call the [facility] office at [phone] if you have any questions, problems or concerns.

## 2012-07-25 NOTE — Progress Notes (Signed)
[name] Date of Birth: 04-28-1945 Medical Record #[id number]  History of Present Illness: [name] is seen back today for a post hospital visit. Seen for Dr. [place]. 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 EF was normal. Other issues include HLD, HTN, tobacco abuse, COPD (on chronic steroid therapy) and GERD. He reports that he is no longer smoking.  Most recently admitted for cath following abnormal Myoview with class III angina and inferior ischemia. He had 2 bare metal Veriflex to a 90% proximal RCA and 80% PLOM branch respectively. To continue DAPT for at least 1 month and perhaps longer. He has had trouble tolerating Plavix in the past. His pravastatin was replaced with moderate dose Lipitor. Protonix was added due to GI upset with Plavix in the past. B1 selective BB started as well - he has been intolerant in the past due to his COPD.   Comes in today. Here with an interpreter. Doing ok. His history is very hard to follow even with the interpreter but overall it does sound like he is feeling better since his PCI. He has only used NTG x 1 - apparently was using more prior to the PCI. No medicines taken yet today. Seems to be taking all of his medicines. Some generalized weakness and also complaining of ED symptoms.   Current Outpatient Prescriptions  Medication Sig Dispense Refill  . albuterol (PROVENTIL HFA;VENTOLIN HFA) 108 (90 BASE) MCG/ACT inhaler Inhale 2 puffs into the lungs every 6 (six) hours as needed.  3 Inhaler  1  . albuterol (PROVENTIL) (2.5 MG/3ML) 0.083% nebulizer solution Take 2.5 mg by nebulization 2 (two) times daily. And as needed. Dx 496,  FILE MCR PART B      . amLODipine (NORVASC) 5 MG tablet Take 1 tablet (5 mg total) by mouth daily.  30 tablet  11  . aspirin 81 MG tablet Take 81 mg by mouth daily as needed.       [name] atorvastatin (LIPITOR) 20 MG tablet Take 1 tablet (20 mg total) by mouth daily.  30 tablet  3  . beclomethasone (QVAR) 80 MCG/ACT inhaler  Inhale 2 puffs into the lungs 2 (two) times daily.      . bisoprolol (ZEBETA) 5 MG tablet Take 1 tablet (5 mg total) by mouth daily.  30 tablet  3  . clopidogrel (PLAVIX) 75 MG tablet Take 1 tablet (75 mg total) by mouth daily with breakfast.  30 tablet  1  . fluticasone (FLOVENT HFA) 44 MCG/ACT inhaler Inhale 1 puff into the lungs 2 (two) times daily.      . nitroGLYCERIN (NITROSTAT) 0.4 MG SL tablet Place 1 tablet (0.4 mg total) under the tongue every 5 (five) minutes as needed for chest pain.  25 tablet  11  . pantoprazole (PROTONIX) 40 MG tablet Take 1 tablet (40 mg total) by mouth daily at 6 (six) AM.  30 tablet  3  . potassium chloride SA (K-DUR,KLOR-CON) 20 MEQ tablet Take 20 mEq by mouth daily.      . predniSONE (DELTASONE) 10 MG tablet Take 10 mg by mouth daily.      . [DISCONTINUED] arformoterol (BROVANA) 15 MCG/2ML NEBU Take 2 mLs (15 mcg total) by nebulization 2 (two) times daily. Dx code: 496  120 mL  11  . [DISCONTINUED] budesonide (PULMICORT) 0.5 MG/2ML nebulizer solution Take 2 mLs (0.5 mg total) by nebulization 2 (two) times daily. DX:  496  120 mL  11  . [DISCONTINUED]  esomeprazole (NEXIUM) 40 MG capsule Take 1 capsule (40 mg total) by mouth daily.  30 capsule  6   No current facility-administered medications for this visit.    No Known Allergies  Past Medical History  Diagnosis Date  . COPD (chronic obstructive pulmonary disease)     former smoker  . GERD (gastroesophageal reflux disease)   . CAD (coronary artery disease), native coronary artery 6/11, 2/14, 6/14    a. thought due to vasospasm . cath - CAD LAD 40-50%, LCX 40%, RCA 25% EF 65%. lexiscan myoview EF 68% w/o evidence of ischemia or infarct b. STEMI s/p BMS-mid RCA c. BMS x 2- prox RCA, PLOM  . HTN (hypertension)   . Asthma   . Hyperlipidemia   . Tobacco abuse   . Allergic rhinitis     Past Surgical History  Procedure Laterality Date  . Left foot surgery      repair - and ankle   . Coronary angioplasty  with stent placement  02/15/12    20% distal LM, 30-40% mid LAD, 50% ostial diag, 50-60% mid LCx, 99% focal mid RCA stenosis s/p BMS; 20% distal RCA disease, 50% prox PLB; LVEF 55-65%  . Coronary angioplasty with stent placement  07/01/12    30% prox LAD, 30% mid LAD, 20% mid LCx, 90% prox RCA s/p BMS, patent mid RCA stent, 30-40% distal RCA, 80% PLB s/p BMS; EF 55-65%    History  Smoking status  . Former Smoker -- 1.00 packs/day for 50 years  . Types: Cigarettes  . Quit date: 01/09/2008  Smokeless tobacco  . Never Used    Comment: rolled own cigarettes since age of 34,     History  Alcohol Use No    Comment: occassional     History reviewed. No pertinent family history.  Review of Systems: The review of systems is per the HPI.  All other systems were reviewed and are negative.  Physical Exam: BP 160/78  Pulse 60  Ht 5' (1.524 m)  Wt 138 lb 6.4 oz (62.778 kg)  BMI 27.03 kg/m2 BP by me is down to 120/70.  Patient is very pleasant and in no acute distress. Skin is warm and dry. Color is normal.  HEENT is unremarkable. Normocephalic/atraumatic. PERRL. Sclera are nonicteric. Neck is supple. No masses. No JVD. Lungs are clear. Cardiac exam shows a regular rate and rhythm. Abdomen is soft. Extremities are without edema. Gait and ROM are intact. No gross neurologic deficits noted.  LABORATORY DATA: BMET and CBC pending    Chemistry      Component Value Date/Time   NA 139 07/02/2012 0530   K 3.8 07/02/2012 0530   CL 107 07/02/2012 0530   CO2 25 07/02/2012 0530   BUN 16 07/02/2012 0530   CREATININE 0.83 07/02/2012 0530      Component Value Date/Time   CALCIUM 8.5 07/02/2012 0530   ALKPHOS 56 06/18/2012 1028   AST 24 06/18/2012 1028   ALT 24 06/18/2012 1028   BILITOT 0.6 06/18/2012 1028     Lab Results  Component Value Date   WBC 8.2 07/02/2012   HGB 15.3 07/02/2012   HCT 44.5 07/02/2012   MCV 87.8 07/02/2012   PLT 255 07/02/2012    Coronary angiography:   Left mainstem: Normal.    Left anterior descending (LAD): The left anterior descending artery is a large vessel extending to the apex. It gives rise to a single moderate diagonal branch. There is 30% disease in the proximal  and mid LAD.  Left circumflex (LCx): The left circumflex gives rise to a single marginal branch. It has mild disease in the mid vessel up to 20%.  Right coronary artery (RCA): The right coronary is a dominant vessel. In the proximal vessel there is a 90% focal stenosis. The stent in the mid vessel is widely patent. In the distal RCA there is diffuse 30-40% disease. The posterior lateral branch has a focal 80% stenosis proximally.   Left ventriculography: Left ventricular systolic function is normal, LVEF is estimated at 55-65%, there is no significant mitral regurgitation   Final Conclusions:  1. Severe single vessel obstructive coronary disease. New lesions in the proximal RCA and the PLOM branch. The stent in the mid RCA is still widely patent.  2. Normal LV function.  3. Successful stenting of the proximal RCA and the PLOM with bare-metal stents.   Recommendations:  Continue dual antiplatelet therapy for at least one month.   Theron Arista [facility]  07/01/2012, 1:37 PM    Assessment / Plan: 1. CAD with recent stenting of the proximal RCA and PLOM with BMS - back on Plavix for at least a month - will try to keep on longer if able to tolerate - he currently seems to be doing well. Only used NTG x 1  2. ED - will readdress on return visit  3. HLD - recheck labs on return visit  4. HTN - BP by me has improved.   Patient is agreeable to this plan and will call if any problems develop in the interim.   Rosalio Macadamia, RN, ANP-C [facility] [address] [place], [place]  [address]

## 2012-07-31 ENCOUNTER — Other Ambulatory Visit: Payer: Self-pay | Admitting: *Deleted

## 2012-07-31 MED ORDER — ALBUTEROL SULFATE HFA 108 (90 BASE) MCG/ACT IN AERS: 2.0000 | INHALATION_SPRAY | Freq: Four times a day (QID) | RESPIRATORY_TRACT | Status: DC | PRN

## 2012-07-31 MED ORDER — ALBUTEROL SULFATE (2.5 MG/3ML) 0.083% IN NEBU: 2.5000 mg | INHALATION_SOLUTION | Freq: Two times a day (BID) | RESPIRATORY_TRACT | Status: DC

## 2012-08-01 ENCOUNTER — Telehealth: Payer: Self-pay | Admitting: Critical Care Medicine

## 2012-08-01 NOTE — Telephone Encounter (Signed)
This was faxed on 07/31/2012 per our records.

## 2012-08-22 ENCOUNTER — Encounter: Payer: Self-pay | Admitting: Nurse Practitioner

## 2012-08-22 ENCOUNTER — Ambulatory Visit (INDEPENDENT_AMBULATORY_CARE_PROVIDER_SITE_OTHER): Payer: Medicare HMO | Admitting: Nurse Practitioner

## 2012-08-22 VITALS — BP 138/82 | HR 60

## 2012-08-22 DIAGNOSIS — E785 Hyperlipidemia, unspecified: Secondary | ICD-10-CM

## 2012-08-22 LAB — HEPATIC FUNCTION PANEL
ALT: 23 U/L (ref 0–53)
AST: 19 U/L (ref 0–37)
Albumin: 3.9 g/dL (ref 3.5–5.2)
Alkaline Phosphatase: 55 U/L (ref 39–117)
Bilirubin, Direct: 0.1 mg/dL (ref 0.0–0.3)
Total Bilirubin: 0.8 mg/dL (ref 0.3–1.2)
Total Protein: 6.6 g/dL (ref 6.0–8.3)

## 2012-08-22 LAB — BASIC METABOLIC PANEL
BUN: 15 mg/dL (ref 6–23)
CO2: 32 mEq/L (ref 19–32)
Calcium: 8.8 mg/dL (ref 8.4–10.5)
Chloride: 105 mEq/L (ref 96–112)
Creatinine, Ser: 1 mg/dL (ref 0.4–1.5)
GFR: 81.9 mL/min (ref >60.00)
Glucose, Bld: 96 mg/dL (ref 70–99)
Potassium: 4.2 mEq/L (ref 3.5–5.1)
Sodium: 138 mEq/L (ref 135–145)

## 2012-08-22 LAB — LIPID PANEL
Cholesterol: 209 mg/dL — ABNORMAL HIGH (ref 0–200)
HDL: 37.3 mg/dL — ABNORMAL LOW (ref >39.00)
Total CHOL/HDL Ratio: 6
Triglycerides: 163 mg/dL — ABNORMAL HIGH (ref 0.0–149.0)
VLDL: 32.6 mg/dL (ref 0.0–40.0)

## 2012-08-22 LAB — LDL CHOLESTEROL, DIRECT: Direct LDL: 148.5 mg/dL

## 2012-08-22 NOTE — Patient Instructions (Addendum)
We will be checking labs today  Stop the bisoprolol but stay on all your other medicines  I will see you in a month  Call the [facility] office at [phone] if you have any questions, problems or concerns.

## 2012-08-22 NOTE — Progress Notes (Signed)
Gabryel Crisafulli Date of Birth: 12/24/1945 Medical Record #[id number]  History of Present Illness: Mr. Riebel is seen back today for a 4 week check. Seen for Dr. [place]. 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 EF was normal. Other issues include HLD, HTN, tobacco abuse, COPD (on chronic steroid therapy) and GERD. He reports that he is no longer smoking. He has had trouble tolerating Plavix in the past.   Admitted back in June of 2014 for cath following abnormal Myoview with class III angina and inferior ischemia. He had 2 bare metal Veriflex to a 90% proximal RCA and 80% PLOM branch respectively. To continue DAPT for at least 1 month and perhaps longer. He has had trouble tolerating Plavix in the past. His pravastatin was replaced with moderate dose Lipitor. Protonix was added due to GI upset with Plavix in the past. B1 selective BB started as well - he has been intolerant in the past due to his COPD.   Seen a month ago and seemed to be doing ok. Was complaining of ED symptoms.   Comes back today. No interpreter (despite call). He is fasting today.  He actually does speak some English and seems to understand what I am asking. Some dizziness reported by him. Says it is coming from the Plavix/Prontonix/Zebeta. No chest pain. No NTG use. Shortness of breath is unchanged. Other than the dizziness, he feels good.   Current Outpatient Prescriptions  Medication Sig Dispense Refill  . albuterol (PROVENTIL HFA;VENTOLIN HFA) 108 (90 BASE) MCG/ACT inhaler Inhale 2 puffs into the lungs every 6 (six) hours as needed.  1 Inhaler  3  . albuterol (PROVENTIL) (2.5 MG/3ML) 0.083% nebulizer solution Take 3 mLs (2.5 mg total) by nebulization 2 (two) times daily. And as needed. Dx 496,  FILE MCR PART B  360 mL  2  . amLODipine (NORVASC) 5 MG tablet Take 1 tablet (5 mg total) by mouth daily.  30 tablet  11  . aspirin 81 MG tablet Take 81 mg by mouth daily as needed.       [name] atorvastatin (LIPITOR) 20 MG tablet  Take 1 tablet (20 mg total) by mouth daily.  30 tablet  3  . beclomethasone (QVAR) 80 MCG/ACT inhaler Inhale 2 puffs into the lungs 2 (two) times daily.      . clopidogrel (PLAVIX) 75 MG tablet Take 1 tablet (75 mg total) by mouth daily with breakfast.  30 tablet  1  . fluticasone (FLOVENT HFA) 44 MCG/ACT inhaler Inhale 1 puff into the lungs 2 (two) times daily.      . nitroGLYCERIN (NITROSTAT) 0.4 MG SL tablet Place 1 tablet (0.4 mg total) under the tongue every 5 (five) minutes as needed for chest pain.  25 tablet  11  . pantoprazole (PROTONIX) 40 MG tablet Take 1 tablet (40 mg total) by mouth daily at 6 (six) AM.  30 tablet  3  . potassium chloride SA (K-DUR,KLOR-CON) 20 MEQ tablet Take 20 mEq by mouth daily.      . predniSONE (DELTASONE) 10 MG tablet Take 10 mg by mouth daily.      [name] SPIRIVA HANDIHALER 18 MCG inhalation capsule Place 18 mcg into inhaler and inhale daily.       . [DISCONTINUED] arformoterol (BROVANA) 15 MCG/2ML NEBU Take 2 mLs (15 mcg total) by nebulization 2 (two) times daily. Dx code: 496  120 mL  11  . [DISCONTINUED] budesonide (PULMICORT) 0.5 MG/2ML nebulizer solution Take 2 mLs (0.5  mg total) by nebulization 2 (two) times daily. DX:  496  120 mL  11  . [DISCONTINUED] esomeprazole (NEXIUM) 40 MG capsule Take 1 capsule (40 mg total) by mouth daily.  30 capsule  6   No current facility-administered medications for this visit.    No Known Allergies  Past Medical History  Diagnosis Date  . COPD (chronic obstructive pulmonary disease)     former smoker  . GERD (gastroesophageal reflux disease)   . CAD (coronary artery disease), native coronary artery 6/11, 2/14, 6/14    a. thought due to vasospasm . cath - CAD LAD 40-50%, LCX 40%, RCA 25% EF 65%. lexiscan myoview EF 68% w/o evidence of ischemia or infarct b. STEMI s/p BMS-mid RCA c. BMS x 2- prox RCA, PLOM  . HTN (hypertension)   . Asthma   . Hyperlipidemia   . Tobacco abuse   . Allergic rhinitis     Past Surgical  History  Procedure Laterality Date  . Left foot surgery      repair - and ankle   . Coronary angioplasty with stent placement  02/15/12    20% distal LM, 30-40% mid LAD, 50% ostial diag, 50-60% mid LCx, 99% focal mid RCA stenosis s/p BMS; 20% distal RCA disease, 50% prox PLB; LVEF 55-65%  . Coronary angioplasty with stent placement  07/01/12    30% prox LAD, 30% mid LAD, 20% mid LCx, 90% prox RCA s/p BMS, patent mid RCA stent, 30-40% distal RCA, 80% PLB s/p BMS; EF 55-65%    History  Smoking status  . Former Smoker -- 1.00 packs/day for 50 years  . Types: Cigarettes  . Quit date: 01/09/2008  Smokeless tobacco  . Never Used    Comment: rolled own cigarettes since age of 48,     History  Alcohol Use No    Comment: occassional     History reviewed. No pertinent family history.  Review of Systems: The review of systems is per the HPI.  All other systems were reviewed and are negative.  Physical Exam: BP 138/82  Pulse 60 BP by me is 118/60.  Patient is very pleasant and in no acute distress. Skin is warm and dry. Color is normal.  HEENT is unremarkable. Normocephalic/atraumatic. PERRL. Sclera are nonicteric. Neck is supple. No masses. No JVD. Lungs are clear. Cardiac exam shows a regular rate and rhythm. Abdomen is soft. Extremities are without edema. Gait and ROM are intact. No gross neurologic deficits noted.  LABORATORY DATA: Fasting labs pending for today.     Chemistry      Component Value Date/Time   NA 138 07/25/2012 0926   K 3.8 07/25/2012 0926   CL 105 07/25/2012 0926   CO2 28 07/25/2012 0926   BUN 13 07/25/2012 0926   CREATININE 1.0 07/25/2012 0926      Component Value Date/Time   CALCIUM 8.9 07/25/2012 0926   ALKPHOS 56 06/18/2012 1028   AST 24 06/18/2012 1028   ALT 24 06/18/2012 1028   BILITOT 0.6 06/18/2012 1028     Lab Results  Component Value Date   WBC 6.1 07/25/2012   HGB 14.8 07/25/2012   HCT 44.0 07/25/2012   MCV 90.2 07/25/2012   PLT 305.0 07/25/2012   Lab  Results  Component Value Date   CHOL 156 06/18/2012   HDL 45.30 06/18/2012   LDLCALC 89 06/18/2012   LDLDIRECT 142.6 03/17/2012   TRIG 111.0 06/18/2012   CHOLHDL 3 06/18/2012    Coronary angiography:  Left mainstem: Normal.  Left anterior descending (LAD): The left anterior descending artery is a large vessel extending to the apex. It gives rise to a single moderate diagonal branch. There is 30% disease in the proximal and mid LAD.  Left circumflex (LCx): The left circumflex gives rise to a single marginal branch. It has mild disease in the mid vessel up to 20%.  Right coronary artery (RCA): The right coronary is a dominant vessel. In the proximal vessel there is a 90% focal stenosis. The stent in the mid vessel is widely patent. In the distal RCA there is diffuse 30-40% disease. The posterior lateral branch has a focal 80% stenosis proximally.  Left ventriculography: Left ventricular systolic function is normal, LVEF is estimated at 55-65%, there is no significant mitral regurgitation   Final Conclusions:  1. Severe single vessel obstructive coronary disease. New lesions in the proximal RCA and the PLOM branch. The stent in the mid RCA is still widely patent.  2. Normal LV function.  3. Successful stenting of the proximal RCA and the PLOM with bare-metal stents.   Recommendations:  Continue dual antiplatelet therapy for at least one month.  Theron Arista [facility]  07/01/2012, 1:37 PM   Assessment / Plan: 1. CAD - with stenting of the proximal RCA and PLOM with BMS - remains on Plavix - will try to continue for another month.   2. HTN - BP looks good. Complaining of dizziness with his Plavix/Protonix/Zebeta - I think this is coming from the Zebeta - will stop only this and continue his other medicines for hopefully another month.   3. ED   4. HLD - now on lipitor - needs repeat labs today.   See him back in 4 weeks.   Patient is agreeable to this plan and will call if any problems develop  in the interim.   Rosalio Macadamia, RN, ANP-C [facility] [address] [place], [place]  [address]

## 2012-08-27 ENCOUNTER — Encounter: Payer: Self-pay | Admitting: Physician Assistant

## 2012-09-02 ENCOUNTER — Encounter: Payer: Self-pay | Admitting: Physician Assistant

## 2012-09-02 ENCOUNTER — Ambulatory Visit (INDEPENDENT_AMBULATORY_CARE_PROVIDER_SITE_OTHER): Payer: Medicare HMO | Admitting: Physician Assistant

## 2012-09-02 ENCOUNTER — Ambulatory Visit: Payer: Medicare HMO | Admitting: Physician Assistant

## 2012-09-02 VITALS — BP 140/80 | HR 73 | Ht <= 58 in | Wt 138.2 lb

## 2012-09-02 DIAGNOSIS — Z1211 Encounter for screening for malignant neoplasm of colon: Secondary | ICD-10-CM

## 2012-09-02 NOTE — Patient Instructions (Addendum)
We recommend you have a colonoscopy or virtual colonoscopy x ray for screening for colon cancer. If you decide to go ahead with the colonoscopy come back in February 2015 to get scheduled. Call in December 2-14 to make office appointment for 02/2013.

## 2012-09-02 NOTE — Progress Notes (Signed)
Subjective:    Patient ID: [name], male    DOB: 10/16/1945, 67 y.o.   MRN: [id number]  HPI  [name] is a 66 year old [place] male known to very mildly to Dr. Jarold Motto. Patient is referred by his primary care physician today to discuss screening colonoscopy. Patient has not had prior colon screening. He did have an upper endoscopy here in 2004 which was a normal exam. Patient has no current GI complaints, specifically denies any alteration in bowel habits melena or hematochezia and has not had any abdominal pain. He is uncertain about his family history He does have history of coronary artery disease and had an MI in February of 2014 and had a stent placed to the RCA at that time. He has been maintained on Plavix and aspirin. He had an abnormal Myoview in June of 2014 as well as anginal symptoms and had repeat cath done with 2 more stents placed at that time. Patient relates that he has been feeling well since. He is concerned about having a colonoscopy and at this time states that he does not wish to pursue colonoscopy.    Review of Systems  Constitutional: Negative.   HENT: Negative.   Eyes: Negative.   Respiratory: Negative.   Cardiovascular: Negative.   Gastrointestinal: Negative.   Endocrine: Negative.   Genitourinary: Negative.   Musculoskeletal: Negative.   Skin: Negative.   Allergic/Immunologic: Negative.   Neurological: Negative.   Hematological: Negative.   Psychiatric/Behavioral: Negative.    Outpatient Prescriptions Prior to Visit  Medication Sig Dispense Refill  . albuterol (PROVENTIL HFA;VENTOLIN HFA) 108 (90 BASE) MCG/ACT inhaler Inhale 2 puffs into the lungs every 6 (six) hours as needed.  1 Inhaler  3  . albuterol (PROVENTIL) (2.5 MG/3ML) 0.083% nebulizer solution Take 3 mLs (2.5 mg total) by nebulization 2 (two) times daily. And as needed. Dx 496,  FILE MCR PART B  360 mL  2  . amLODipine (NORVASC) 5 MG tablet Take 1 tablet (5 mg total) by mouth daily.  30  tablet  11  . aspirin 81 MG tablet Take 81 mg by mouth daily as needed.       [name] atorvastatin (LIPITOR) 20 MG tablet Take 1 tablet (20 mg total) by mouth daily.  30 tablet  3  . beclomethasone (QVAR) 80 MCG/ACT inhaler Inhale 2 puffs into the lungs 2 (two) times daily.      . clopidogrel (PLAVIX) 75 MG tablet Take 1 tablet (75 mg total) by mouth daily with breakfast.  30 tablet  1  . fluticasone (FLOVENT HFA) 44 MCG/ACT inhaler Inhale 1 puff into the lungs 2 (two) times daily.      . nitroGLYCERIN (NITROSTAT) 0.4 MG SL tablet Place 1 tablet (0.4 mg total) under the tongue every 5 (five) minutes as needed for chest pain.  25 tablet  11  . pantoprazole (PROTONIX) 40 MG tablet Take 1 tablet (40 mg total) by mouth daily at 6 (six) AM.  30 tablet  3  . potassium chloride SA (K-DUR,KLOR-CON) 20 MEQ tablet Take 20 mEq by mouth daily.      . predniSONE (DELTASONE) 10 MG tablet Take 10 mg by mouth daily.      [name] SPIRIVA HANDIHALER 18 MCG inhalation capsule Place 18 mcg into inhaler and inhale daily.        No facility-administered medications prior to visit.   No Known Allergies Patient Active Problem List   Diagnosis Date Noted  . Abnormal stress test 07/02/2012  .  Hypokalemia 07/02/2012  . STEMI (ST elevation myocardial infarction) 02/17/2012  . Hyperlipidemia 02/17/2012  . Essential hypertension 02/17/2012  . Tobacco abuse 02/17/2012  . Allergic rhinitis 02/17/2012  . CAD 06/14/2009  . COPD Gold C 05/31/2009  . OBSTRUCTIVE CHRONIC BRONCHITIS 11/13/2006  . GERD 11/13/2006   History  Substance Use Topics  . Smoking status: Former Smoker -- 1.00 packs/day for 50 years    Types: Cigarettes    Quit date: 01/09/2008  . Smokeless tobacco: Never Used     Comment: rolled own cigarettes since age of 89,   . Alcohol Use: No     Comment: occassional    Family history is unknown by patient.     Objective:   Physical Exam  well-developed Asian male in no acute distress. Accompanied by an  interpreter blood pressure 140/80 pulse 73 height 4 foot 9 weight 1:30. HEENT nontraumatic normocephalic EOMI PERRLA sclera anicteric, Supple no JVD, Cardiovascular regular rate and rhythm with S1-S2 no murmur or gallop, Pulmonary clear bilaterally, Abdomen soft nontender nondistended bowel sounds are active there is no palpable mass or hepatosplenomegaly, Rectal exam not done, Extremities no clubbing cyanosis or edema skin warm and, Psych mood and affect normal and appropriate        Assessment & Plan:  #53 67 year old  [place] male referred for colon neoplasia screening. Patient is asymptomatic. #2 coronary artery disease status post MI February 2014 with stent placed to the RCA. Patient had angina and an abnormal Myoview 06/19/2012 and required repeat catheter with placement of 2 additional coronary stents. He is on Plavix and aspirin.  #3 COPD  Plan; patient is not a good candidate for screening colonoscopy at this time with recent coronary stent placement 2 months ago . Would prefer to wait one year post MI and stent placement to pursue screening colonoscopy off Plavix . Long discussion with the patient via interpreter regarding timing of colonoscopy and also of alternative screening methods including virtual colonoscopy which could be done now with him on Plavix and aspirin. He is not sure that he is interested in pursuing any colon screening but voices clear understanding of his options and understands purpose of prevention of colon cancer . Should he decide to pursue virtual colonoscopy he will call back to be scheduled. If he would like to proceed with colonoscopy have advised him to come back in February or March of 2015 when he is a  year out post MI.

## 2012-09-14 ENCOUNTER — Other Ambulatory Visit: Payer: Self-pay | Admitting: Critical Care Medicine

## 2012-09-19 ENCOUNTER — Ambulatory Visit (INDEPENDENT_AMBULATORY_CARE_PROVIDER_SITE_OTHER): Payer: Medicare HMO | Admitting: Nurse Practitioner

## 2012-09-19 ENCOUNTER — Encounter: Payer: Self-pay | Admitting: Nurse Practitioner

## 2012-09-19 VITALS — BP 128/76 | HR 72 | Ht 59.0 in | Wt 143.2 lb

## 2012-09-19 DIAGNOSIS — I2510 Atherosclerotic heart disease of native coronary artery without angina pectoris: Secondary | ICD-10-CM

## 2012-09-19 MED ORDER — ATORVASTATIN CALCIUM 40 MG PO TABS: 40.0000 mg | ORAL_TABLET | Freq: Every day | ORAL | Status: DC

## 2012-09-19 NOTE — Progress Notes (Signed)
[name] Date of Birth: [birth year] Medical Record #[id number]  History of Present Illness: [name] is seen back today for a one month check. Seen for Dr. Swaziland. 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 EF was normal. Other issues include HLD, HTN, tobacco abuse, COPD (on chronic steroid therapy) and GERD. He reports that he is no longer smoking. He has had trouble tolerating Plavix in the past.   Admitted back in June of 2014 for cath following abnormal Myoview with class III angina and inferior ischemia. He had 2 bare metal Veriflex to a 90% proximal RCA and 80% PLOM branch respectively. To continue DAPT for at least 1 month and perhaps longer. He has had trouble tolerating Plavix in the past. His pravastatin was replaced with moderate dose Lipitor. Protonix was added due to GI upset with Plavix in the past. B1 selective BB started as well - he has been intolerant in the past due to his COPD.   Seen a month ago for follow up and complaining of dizziness. Says it is coming from the Plavix/Prontonix/Zebeta. No chest pain. No NTG use. Shortness of breath is unchanged. Other than the dizziness, he was feeling good. We stopped his Zebeta and tried to keep him on his Plavix a little longer.   Comes back today. Here with an interpreter today. He is doing good. No chest pain. Not dizzy any more. Feels good on his current medicines. Walking 30 minutes a day. We did review his lipids and have increased his dose today.   Current Outpatient Prescriptions  Medication Sig Dispense Refill  . albuterol (PROVENTIL HFA;VENTOLIN HFA) 108 (90 BASE) MCG/ACT inhaler Inhale 2 puffs into the lungs every 6 (six) hours as needed.  1 Inhaler  3  . albuterol (PROVENTIL) (2.5 MG/3ML) 0.083% nebulizer solution Take 3 mLs (2.5 mg total) by nebulization 2 (two) times daily. And as needed. Dx 496,  FILE MCR PART B  360 mL  2  . amLODipine (NORVASC) 5 MG tablet Take 1 tablet (5 mg total) by mouth daily.  30 tablet   11  . aspirin 81 MG tablet Take 81 mg by mouth daily as needed.       [name] atorvastatin (LIPITOR) 20 MG tablet Take 1 tablet (20 mg total) by mouth daily.  30 tablet  3  . beclomethasone (QVAR) 80 MCG/ACT inhaler Inhale 2 puffs into the lungs 2 (two) times daily.      . clopidogrel (PLAVIX) 75 MG tablet Take 1 tablet (75 mg total) by mouth daily with breakfast.  30 tablet  1  . fluticasone (FLOVENT HFA) 44 MCG/ACT inhaler Inhale 1 puff into the lungs 2 (two) times daily.      . nitroGLYCERIN (NITROSTAT) 0.4 MG SL tablet Place 1 tablet (0.4 mg total) under the tongue every 5 (five) minutes as needed for chest pain.  25 tablet  11  . pantoprazole (PROTONIX) 40 MG tablet Take 1 tablet (40 mg total) by mouth daily at 6 (six) AM.  30 tablet  3  . potassium chloride SA (K-DUR,KLOR-CON) 20 MEQ tablet Take 20 mEq by mouth daily.      . predniSONE (DELTASONE) 10 MG tablet Take 10 mg by mouth daily.      [name] QVAR 80 MCG/ACT inhaler INHALE 2 PUFFS INTO THE LUNGS 2 (TWO) TIMES DAILY.  8.7 g  5  . SPIRIVA HANDIHALER 18 MCG inhalation capsule Place 18 mcg into inhaler and inhale daily.       . [  DISCONTINUED] arformoterol (BROVANA) 15 MCG/2ML NEBU Take 2 mLs (15 mcg total) by nebulization 2 (two) times daily. Dx code: 496  120 mL  11  . [DISCONTINUED] budesonide (PULMICORT) 0.5 MG/2ML nebulizer solution Take 2 mLs (0.5 mg total) by nebulization 2 (two) times daily. DX:  496  120 mL  11  . [DISCONTINUED] esomeprazole (NEXIUM) 40 MG capsule Take 1 capsule (40 mg total) by mouth daily.  30 capsule  6   No current facility-administered medications for this visit.    No Known Allergies  Past Medical History  Diagnosis Date  . COPD (chronic obstructive pulmonary disease)     former smoker  . GERD (gastroesophageal reflux disease)   . CAD (coronary artery disease), native coronary artery 6/11, 2/14, 6/14    a. thought due to vasospasm . cath - CAD LAD 40-50%, LCX 40%, RCA 25% EF 65%. lexiscan myoview EF 68% w/o  evidence of ischemia or infarct b. STEMI s/p BMS-mid RCA c. BMS x 2- prox RCA, PLOM  . HTN (hypertension)   . Asthma   . Hyperlipidemia   . Tobacco abuse   . Allergic rhinitis     Past Surgical History  Procedure Laterality Date  . Left foot surgery      repair - and ankle   . Coronary angioplasty with stent placement  02/15/12    20% distal LM, 30-40% mid LAD, 50% ostial diag, 50-60% mid LCx, 99% focal mid RCA stenosis s/p BMS; 20% distal RCA disease, 50% prox PLB; LVEF 55-65%  . Coronary angioplasty with stent placement  07/01/12    30% prox LAD, 30% mid LAD, 20% mid LCx, 90% prox RCA s/p BMS, patent mid RCA stent, 30-40% distal RCA, 80% PLB s/p BMS; EF 55-65%    History  Smoking status  . Former Smoker -- 1.00 packs/day for 50 years  . Types: Cigarettes  . Quit date: 01/09/2008  Smokeless tobacco  . Never Used    Comment: rolled own cigarettes since age of 89,     History  Alcohol Use No    Comment: occassional     History reviewed. No pertinent family history.  Review of Systems: The review of systems is per the HPI.  All other systems were reviewed and are negative.  Physical Exam: BP 128/76  Pulse 72  Ht 4\' 11"  (1.499 m)  Wt 143 lb 3.2 oz (64.955 kg)  BMI 28.91 kg/m2 Patient is very pleasant and in no acute distress. Skin is warm and dry. Color is normal.  HEENT is unremarkable. Normocephalic/atraumatic. PERRL. Sclera are nonicteric. Neck is supple. No masses. No JVD. Lungs are clear. Cardiac exam shows a regular rate and rhythm. Abdomen is soft. Extremities are without edema. Gait and ROM are intact. No gross neurologic deficits noted.  LABORATORY DATA:   Chemistry      Component Value Date/Time   NA 138 08/22/2012 0955   K 4.2 08/22/2012 0955   CL 105 08/22/2012 0955   CO2 32 08/22/2012 0955   BUN 15 08/22/2012 0955   CREATININE 1.0 08/22/2012 0955      Component Value Date/Time   CALCIUM 8.8 08/22/2012 0955   ALKPHOS 55 08/22/2012 0955   AST 19 08/22/2012 0955    ALT 23 08/22/2012 0955   BILITOT 0.8 08/22/2012 0955     Lab Results  Component Value Date   WBC 6.1 07/25/2012   HGB 14.8 07/25/2012   HCT 44.0 07/25/2012   MCV 90.2 07/25/2012   PLT 305.0  07/25/2012   Lab Results  Component Value Date   CHOL 209* 08/22/2012   HDL 37.30* 08/22/2012   LDLCALC 89 06/18/2012   LDLDIRECT 148.5 08/22/2012   TRIG 163.0* 08/22/2012   CHOLHDL 6 08/22/2012    Wt Readings from Last 3 Encounters:  09/19/12 143 lb 3.2 oz (64.955 kg)  09/02/12 138 lb 3.2 oz (62.687 kg)  07/25/12 138 lb 6.4 oz (62.778 kg)     Assessment / Plan: 1. CAD - with stenting of the proximal RCA and PLOM with BMS - remains on Plavix - tolerating his current regimen without any trouble. See Dr. Swaziland back in 3 months.    2. HTN - BP looks good. Dizziness resolved with stopping Zebeta.   3. ED   4. HLD - now on lipitor - will increase his dose based on recent labs. Recheck labs in 3 months.   Patient is agreeable to this plan and will call if any problems develop in the interim.   Rosalio Macadamia, RN, ANP-C  [facility]  [address]  [place], [place] [address]

## 2012-09-19 NOTE — Patient Instructions (Addendum)
Stay on your current medicines but I am going to increase your Lipitor to 40 mg a day - this prescription is at the drug store  See Dr. Swaziland in 3 months with fasting labs  Stay active and try to work on your weight  Call the [facility] office at [phone] if you have any questions, problems or concerns.

## 2012-10-01 ENCOUNTER — Encounter (HOSPITAL_COMMUNITY): Payer: Self-pay | Admitting: Emergency Medicine

## 2012-10-01 ENCOUNTER — Emergency Department (HOSPITAL_COMMUNITY)
Admission: EM | Admit: 2012-10-01 | Discharge: 2012-10-01 | Disposition: A | Discharge status: Home / Self Care | Payer: No Typology Code available for payment source | Attending: Emergency Medicine | Admitting: Emergency Medicine

## 2012-10-01 ENCOUNTER — Emergency Department (HOSPITAL_COMMUNITY): Payer: No Typology Code available for payment source

## 2012-10-01 DIAGNOSIS — Y9389 Activity, other specified: Secondary | ICD-10-CM | POA: Insufficient documentation

## 2012-10-01 DIAGNOSIS — IMO0002 Reserved for concepts with insufficient information to code with codable children: Secondary | ICD-10-CM | POA: Diagnosis not present

## 2012-10-01 DIAGNOSIS — I10 Essential (primary) hypertension: Secondary | ICD-10-CM | POA: Diagnosis not present

## 2012-10-01 DIAGNOSIS — Z9861 Coronary angioplasty status: Secondary | ICD-10-CM | POA: Diagnosis not present

## 2012-10-01 DIAGNOSIS — Z79899 Other long term (current) drug therapy: Secondary | ICD-10-CM | POA: Diagnosis not present

## 2012-10-01 DIAGNOSIS — Z7902 Long term (current) use of antithrombotics/antiplatelets: Secondary | ICD-10-CM | POA: Insufficient documentation

## 2012-10-01 DIAGNOSIS — I2510 Atherosclerotic heart disease of native coronary artery without angina pectoris: Secondary | ICD-10-CM | POA: Insufficient documentation

## 2012-10-01 DIAGNOSIS — Z87891 Personal history of nicotine dependence: Secondary | ICD-10-CM | POA: Insufficient documentation

## 2012-10-01 DIAGNOSIS — E785 Hyperlipidemia, unspecified: Secondary | ICD-10-CM | POA: Diagnosis not present

## 2012-10-01 DIAGNOSIS — R05 Cough: Secondary | ICD-10-CM | POA: Insufficient documentation

## 2012-10-01 DIAGNOSIS — J449 Chronic obstructive pulmonary disease, unspecified: Secondary | ICD-10-CM | POA: Insufficient documentation

## 2012-10-01 DIAGNOSIS — Z7982 Long term (current) use of aspirin: Secondary | ICD-10-CM | POA: Insufficient documentation

## 2012-10-01 DIAGNOSIS — J4489 Other specified chronic obstructive pulmonary disease: Secondary | ICD-10-CM | POA: Insufficient documentation

## 2012-10-01 DIAGNOSIS — R0602 Shortness of breath: Secondary | ICD-10-CM | POA: Insufficient documentation

## 2012-10-01 DIAGNOSIS — K219 Gastro-esophageal reflux disease without esophagitis: Secondary | ICD-10-CM | POA: Insufficient documentation

## 2012-10-01 DIAGNOSIS — Y92410 Unspecified street and highway as the place of occurrence of the external cause: Secondary | ICD-10-CM | POA: Insufficient documentation

## 2012-10-01 DIAGNOSIS — R0789 Other chest pain: Secondary | ICD-10-CM

## 2012-10-01 DIAGNOSIS — S298XXA Other specified injuries of thorax, initial encounter: Secondary | ICD-10-CM | POA: Insufficient documentation

## 2012-10-01 DIAGNOSIS — R059 Cough, unspecified: Secondary | ICD-10-CM | POA: Insufficient documentation

## 2012-10-01 LAB — HEPATIC FUNCTION PANEL
Albumin: 3.9 g/dL (ref 3.5–5.2)
Alkaline Phosphatase: 55 U/L (ref 39–117)
Bilirubin, Direct: 0.1 mg/dL (ref 0.0–0.3)
Indirect Bilirubin: 0.3 mg/dL (ref 0.3–0.9)
Total Bilirubin: 0.4 mg/dL (ref 0.3–1.2)
Total Protein: 7 g/dL (ref 6.0–8.3)

## 2012-10-01 LAB — POCT I-STAT, CHEM 8
Chloride: 104 mEq/L (ref 96–112)
Creatinine, Ser: 0.9 mg/dL (ref 0.50–1.35)
Glucose, Bld: 104 mg/dL — ABNORMAL HIGH (ref 70–99)
HCT: 49 % (ref 39.0–52.0)
Hemoglobin: 16.7 g/dL (ref 13.0–17.0)
Sodium: 139 mEq/L (ref 135–145)
TCO2: 22 mmol/L (ref 0–100)

## 2012-10-01 LAB — TROPONIN I: Troponin I: <0.3 ng/mL (ref <0.30)

## 2012-10-01 LAB — CBC WITH DIFFERENTIAL/PLATELET
Basophils Absolute: 0 10*3/uL (ref 0.0–0.1)
Basophils Relative: 0 % (ref 0–1)
Eosinophils Relative: 2 % (ref 0–5)
HCT: 46.6 % (ref 39.0–52.0)
Lymphocytes Relative: 19 % (ref 12–46)
MCHC: 35 g/dL (ref 30.0–36.0)
Monocytes Absolute: 0.7 10*3/uL (ref 0.1–1.0)
Neutro Abs: 6.6 10*3/uL (ref 1.7–7.7)
Platelets: 299 10*3/uL (ref 150–400)
RDW: 13.1 % (ref 11.5–15.5)
WBC: 9.3 10*3/uL (ref 4.0–10.5)

## 2012-10-01 MED ORDER — ONDANSETRON HCL 4 MG/2ML IJ SOLN
4.0000 mg | Freq: Once | INTRAMUSCULAR | Status: AC
  Administered 2012-10-01: 4 mg via INTRAVENOUS
  Filled 2012-10-01: qty 2

## 2012-10-01 MED ORDER — SODIUM CHLORIDE 0.9 % IV SOLN
Freq: Once | INTRAVENOUS | Status: AC
  Administered 2012-10-01: 10:00:00 via INTRAVENOUS

## 2012-10-01 MED ORDER — IOHEXOL 350 MG/ML SOLN
100.0000 mL | Freq: Once | INTRAVENOUS | Status: AC | PRN
  Administered 2012-10-01: 100 mL via INTRAVENOUS

## 2012-10-01 MED ORDER — TRAMADOL HCL 50 MG PO TABS: ORAL_TABLET | ORAL | Status: DC

## 2012-10-01 MED ORDER — MORPHINE SULFATE 4 MG/ML IJ SOLN
4.0000 mg | Freq: Once | INTRAMUSCULAR | Status: AC
  Administered 2012-10-01: 4 mg via INTRAVENOUS
  Filled 2012-10-01: qty 1

## 2012-10-01 MED ORDER — IPRATROPIUM BROMIDE 0.02 % IN SOLN
0.5000 mg | Freq: Once | RESPIRATORY_TRACT | Status: AC
  Administered 2012-10-01: 0.5 mg via RESPIRATORY_TRACT
  Filled 2012-10-01: qty 2.5

## 2012-10-01 MED ORDER — ALBUTEROL SULFATE (5 MG/ML) 0.5% IN NEBU
5.0000 mg | INHALATION_SOLUTION | Freq: Once | RESPIRATORY_TRACT | Status: AC
  Administered 2012-10-01: 5 mg via RESPIRATORY_TRACT
  Filled 2012-10-01: qty 1

## 2012-10-01 MED ORDER — METHOCARBAMOL 500 MG PO TABS: 500.0000 mg | ORAL_TABLET | Freq: Four times a day (QID) | ORAL | Status: DC | PRN
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
## 2012-10-01 NOTE — ED Notes (Signed)
Lamar Sprinkles (daughter) [phone]
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
## 2012-10-21 ENCOUNTER — Ambulatory Visit: Payer: Medicare HMO | Admitting: Critical Care Medicine

## 2012-10-22 ENCOUNTER — Encounter: Payer: Self-pay | Admitting: Critical Care Medicine

## 2012-10-22 ENCOUNTER — Ambulatory Visit (INDEPENDENT_AMBULATORY_CARE_PROVIDER_SITE_OTHER): Payer: Commercial Managed Care - HMO | Admitting: Critical Care Medicine

## 2012-10-22 VITALS — BP 138/86 | HR 76 | Temp 97.8°F | Ht 64.0 in | Wt 141.2 lb

## 2012-10-22 DIAGNOSIS — Z23 Encounter for immunization: Secondary | ICD-10-CM

## 2012-10-22 DIAGNOSIS — J449 Chronic obstructive pulmonary disease, unspecified: Secondary | ICD-10-CM

## 2012-10-22 DIAGNOSIS — J4489 Other specified chronic obstructive pulmonary disease: Secondary | ICD-10-CM | POA: Diagnosis not present

## 2012-10-22 MED ORDER — AZITHROMYCIN 250 MG PO TABS: ORAL_TABLET | ORAL | Status: DC

## 2012-10-22 MED ORDER — ALBUTEROL SULFATE (2.5 MG/3ML) 0.083% IN NEBU: 2.5000 mg | INHALATION_SOLUTION | Freq: Two times a day (BID) | RESPIRATORY_TRACT | Status: DC

## 2012-10-22 MED ORDER — AZITHROMYCIN 250 MG PO TABS: 250.0000 mg | ORAL_TABLET | Freq: Every day | ORAL | Status: DC

## 2012-10-22 NOTE — Patient Instructions (Signed)
Azithromycin 250mg  Take two once then one daily until gone Albuterol as needed and twice daily Both Sent daily to [facility] near [address] No other medication changes Flu vaccine and pneumovax given Return 4 months
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
## 2012-10-23 NOTE — Assessment & Plan Note (Signed)
COPD gold stage C.  stable at this time Access issues to medications due to cost Plan Samples given Patient directed to a pharmacy with lower cost on albuterol nebulizer

## 2012-12-17 ENCOUNTER — Other Ambulatory Visit: Payer: Self-pay | Admitting: Critical Care Medicine

## 2012-12-18 ENCOUNTER — Ambulatory Visit (INDEPENDENT_AMBULATORY_CARE_PROVIDER_SITE_OTHER): Payer: Medicare HMO | Admitting: Cardiology

## 2012-12-18 ENCOUNTER — Encounter: Payer: Self-pay | Admitting: Cardiology

## 2012-12-18 VITALS — BP 150/80 | HR 88 | Ht 64.0 in | Wt 143.0 lb

## 2012-12-18 DIAGNOSIS — F17200 Nicotine dependence, unspecified, uncomplicated: Secondary | ICD-10-CM

## 2012-12-18 DIAGNOSIS — I10 Essential (primary) hypertension: Secondary | ICD-10-CM

## 2012-12-18 DIAGNOSIS — I2510 Atherosclerotic heart disease of native coronary artery without angina pectoris: Secondary | ICD-10-CM

## 2012-12-18 DIAGNOSIS — E785 Hyperlipidemia, unspecified: Secondary | ICD-10-CM

## 2012-12-18 DIAGNOSIS — Z720 Tobacco use: Secondary | ICD-10-CM

## 2012-12-18 NOTE — Progress Notes (Signed)
Philopateer Baird Date of Birth: 1945/12/26 Medical Record #[id number]  History of Present Illness: [name] is seen back today for a follow up visit.  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 EF was normal. In June of 2014 for cath following abnormal Myoview with class III angina and inferior ischemia. He had 2 bare metal Veriflex to a 90% proximal RCA and 80% PLOM branch respectively.Other issues include HLD, HTN, tobacco abuse, COPD (on chronic steroid therapy) and GERD. He reports that he is no longer smoking. He has had trouble tolerating Plavix in the past but seems to be doing OK on it now. Breathing better off Zbeta. No cough. Denies any recurrent angina. Tolerating his meds well.  Current Outpatient Prescriptions  Medication Sig Dispense Refill  . albuterol (PROVENTIL HFA;VENTOLIN HFA) 108 (90 BASE) MCG/ACT inhaler Inhale 2 puffs into the lungs every 6 (six) hours as needed.  1 Inhaler  3  . albuterol (PROVENTIL) (2.5 MG/3ML) 0.083% nebulizer solution Take 3 mLs (2.5 mg total) by nebulization 2 (two) times daily. And as needed. Dx 496,  FILE MCR PART B  270 mL  11  . amLODipine (NORVASC) 5 MG tablet Take 1 tablet (5 mg total) by mouth daily.  30 tablet  11  . aspirin 81 MG tablet Take 81 mg by mouth daily as needed.       . beclomethasone (QVAR) 80 MCG/ACT inhaler Inhale 2 puffs into the lungs 2 (two) times daily.      . clopidogrel (PLAVIX) 75 MG tablet Take 1 tablet (75 mg total) by mouth daily with breakfast.  30 tablet  1  . nitroGLYCERIN (NITROSTAT) 0.4 MG SL tablet Place 1 tablet (0.4 mg total) under the tongue every 5 (five) minutes as needed for chest pain.  25 tablet  11  . pantoprazole (PROTONIX) 40 MG tablet Take 1 tablet (40 mg total) by mouth daily at 6 (six) AM.  30 tablet  3  . potassium chloride SA (K-DUR,KLOR-CON) 20 MEQ tablet Take 20 mEq by mouth daily.      . predniSONE (DELTASONE) 10 MG tablet TAKE 1 TABLET (10 MG TOTAL) BY MOUTH DAILY.  30 tablet  4  . SPIRIVA  HANDIHALER 18 MCG inhalation capsule Place 18 mcg into inhaler and inhale daily.       [name] atorvastatin (LIPITOR) 40 MG tablet Take 1 tablet (40 mg total) by mouth daily.  30 tablet  3  . [DISCONTINUED] arformoterol (BROVANA) 15 MCG/2ML NEBU Take 2 mLs (15 mcg total) by nebulization 2 (two) times daily. Dx code: 496  120 mL  11  . [DISCONTINUED] budesonide (PULMICORT) 0.5 MG/2ML nebulizer solution Take 2 mLs (0.5 mg total) by nebulization 2 (two) times daily. DX:  496  120 mL  11  . [DISCONTINUED] esomeprazole (NEXIUM) 40 MG capsule Take 1 capsule (40 mg total) by mouth daily.  30 capsule  6   No current facility-administered medications for this visit.    No Known Allergies  Past Medical History  Diagnosis Date  . COPD (chronic obstructive pulmonary disease)     former smoker  . GERD (gastroesophageal reflux disease)   . CAD (coronary artery disease), native coronary artery 6/11, 2/14, 6/14    a. thought due to vasospasm . cath - CAD LAD 40-50%, LCX 40%, RCA 25% EF 65%. lexiscan myoview EF 68% w/o evidence of ischemia or infarct b. STEMI s/p BMS-mid RCA c. BMS x 2- prox RCA, PLOM  . HTN (  hypertension)   . Asthma   . Hyperlipidemia   . Tobacco abuse   . Allergic rhinitis     Past Surgical History  Procedure Laterality Date  . Left foot surgery      repair - and ankle   . Coronary angioplasty with stent placement  02/15/12    20% distal LM, 30-40% mid LAD, 50% ostial diag, 50-60% mid LCx, 99% focal mid RCA stenosis s/p BMS; 20% distal RCA disease, 50% prox PLB; LVEF 55-65%  . Coronary angioplasty with stent placement  07/01/12    30% prox LAD, 30% mid LAD, 20% mid LCx, 90% prox RCA s/p BMS, patent mid RCA stent, 30-40% distal RCA, 80% PLB s/p BMS; EF 55-65%    History  Smoking status  . Former Smoker -- 1.00 packs/day for 50 years  . Types: Cigarettes  . Quit date: 01/09/2008  Smokeless tobacco  . Never Used    Comment: rolled own cigarettes since age of 19,     History   Alcohol Use No    Comment: occassional     History reviewed. No pertinent family history.  Review of Systems: The review of systems is per the HPI.  All other systems were reviewed and are negative.  Physical Exam: BP 150/80  Pulse 88  Ht 5\' 4"  (1.626 m)  Wt 143 lb (64.864 kg)  BMI 24.53 kg/m2 Patient is very pleasant and in no acute distress. Skin is warm and dry. Color is normal.  HEENT is unremarkable. Normocephalic/atraumatic. PERRL. Sclera are nonicteric. Neck is supple. No masses. No JVD. Lungs are clear. Cardiac exam shows a regular rate and rhythm. Abdomen is soft. Extremities are without edema. Gait and ROM are intact. No gross neurologic deficits noted.  LABORATORY DATA:   Chemistry      Component Value Date/Time   NA 139 10/01/2012 0918   K 3.5 10/01/2012 0918   CL 104 10/01/2012 0918   CO2 32 08/22/2012 0955   BUN 17 10/01/2012 0918   CREATININE 0.90 10/01/2012 0918      Component Value Date/Time   CALCIUM 8.8 08/22/2012 0955   ALKPHOS 55 10/01/2012 0908   AST 23 10/01/2012 0908   ALT 30 10/01/2012 0908   BILITOT 0.4 10/01/2012 0908     Lab Results  Component Value Date   WBC 9.3 10/01/2012   HGB 16.7 10/01/2012   HCT 49.0 10/01/2012   MCV 87.6 10/01/2012   PLT 299 10/01/2012   Lab Results  Component Value Date   CHOL 209* 08/22/2012   HDL 37.30* 08/22/2012   LDLCALC 89 06/18/2012   LDLDIRECT 148.5 08/22/2012   TRIG 163.0* 08/22/2012   CHOLHDL 6 08/22/2012    Wt Readings from Last 3 Encounters:  12/18/12 143 lb (64.864 kg)  10/22/12 141 lb 3.2 oz (64.048 kg)  09/19/12 143 lb 3.2 oz (64.955 kg)     Assessment / Plan: 1. CAD - with stenting of the proximal RCA and PLOM with BMS 6/14, stenting of the mid RCA 2/14- remains on Plavix - tolerating his current regimen without any trouble. Consider stopping plavix in June 2015.   2. HTN - BP well controlled.  3. ED   4. HLD - now on lipitor -he is not fasting today. Will follow up in 3 months and check fasting labs  then.  5. COPD- followed by Dr. Delford Field.

## 2012-12-18 NOTE — Patient Instructions (Signed)
Continue your current therapy  I will see you again in 3 months with fasting lab work

## 2012-12-25 ENCOUNTER — Other Ambulatory Visit: Payer: Self-pay | Admitting: Internal Medicine

## 2012-12-25 DIAGNOSIS — R229 Localized swelling, mass and lump, unspecified: Secondary | ICD-10-CM

## 2012-12-28 ENCOUNTER — Other Ambulatory Visit: Payer: Self-pay | Admitting: Critical Care Medicine

## 2013-01-12 ENCOUNTER — Ambulatory Visit
Admission: RE | Admit: 2013-01-12 | Discharge: 2013-01-12 | Disposition: A | Discharge status: Home / Self Care | Payer: Commercial Managed Care - HMO | Source: Ambulatory Visit | Attending: Internal Medicine | Admitting: Internal Medicine

## 2013-01-12 DIAGNOSIS — R229 Localized swelling, mass and lump, unspecified: Secondary | ICD-10-CM

## 2013-01-18 ENCOUNTER — Other Ambulatory Visit: Payer: Self-pay | Admitting: Nurse Practitioner

## 2013-02-10 ENCOUNTER — Other Ambulatory Visit: Payer: Self-pay | Admitting: Internal Medicine

## 2013-02-10 DIAGNOSIS — J449 Chronic obstructive pulmonary disease, unspecified: Secondary | ICD-10-CM

## 2013-02-13 ENCOUNTER — Ambulatory Visit
Admission: RE | Admit: 2013-02-13 | Discharge: 2013-02-13 | Disposition: A | Discharge status: Home / Self Care | Payer: Commercial Managed Care - HMO | Source: Ambulatory Visit | Attending: Internal Medicine | Admitting: Internal Medicine

## 2013-02-13 DIAGNOSIS — J449 Chronic obstructive pulmonary disease, unspecified: Secondary | ICD-10-CM

## 2013-02-13 MED ORDER — IOHEXOL 300 MG/ML  SOLN
75.0000 mL | Freq: Once | INTRAMUSCULAR | Status: AC | PRN
  Administered 2013-02-13: 75 mL via INTRAVENOUS

## 2013-03-05 ENCOUNTER — Other Ambulatory Visit: Payer: Self-pay | Admitting: Internal Medicine

## 2013-03-06 ENCOUNTER — Other Ambulatory Visit: Payer: Self-pay | Admitting: Critical Care Medicine

## 2013-03-26 ENCOUNTER — Ambulatory Visit (INDEPENDENT_AMBULATORY_CARE_PROVIDER_SITE_OTHER): Payer: Commercial Managed Care - HMO | Admitting: Cardiology

## 2013-03-26 ENCOUNTER — Encounter: Payer: Self-pay | Admitting: Cardiology

## 2013-03-26 ENCOUNTER — Other Ambulatory Visit: Payer: Commercial Managed Care - HMO | Admitting: *Deleted

## 2013-03-26 VITALS — BP 132/90 | HR 68 | Ht 64.0 in | Wt 148.0 lb

## 2013-03-26 DIAGNOSIS — Z720 Tobacco use: Secondary | ICD-10-CM

## 2013-03-26 DIAGNOSIS — E785 Hyperlipidemia, unspecified: Secondary | ICD-10-CM

## 2013-03-26 DIAGNOSIS — J449 Chronic obstructive pulmonary disease, unspecified: Secondary | ICD-10-CM | POA: Diagnosis not present

## 2013-03-26 DIAGNOSIS — I10 Essential (primary) hypertension: Secondary | ICD-10-CM

## 2013-03-26 DIAGNOSIS — I2510 Atherosclerotic heart disease of native coronary artery without angina pectoris: Secondary | ICD-10-CM

## 2013-03-26 DIAGNOSIS — F17200 Nicotine dependence, unspecified, uncomplicated: Secondary | ICD-10-CM

## 2013-03-26 LAB — BASIC METABOLIC PANEL
BUN: 18 mg/dL (ref 6–23)
CALCIUM: 9 mg/dL (ref 8.4–10.5)
CO2: 26 mEq/L (ref 19–32)
CREATININE: 0.9 mg/dL (ref 0.4–1.5)
Chloride: 103 mEq/L (ref 96–112)
GFR: 88 mL/min (ref >60.00)
GLUCOSE: 125 mg/dL — AB (ref 70–99)
Potassium: 4.4 mEq/L (ref 3.5–5.1)
SODIUM: 138 meq/L (ref 135–145)

## 2013-03-26 LAB — LIPID PANEL
Cholesterol: 254 mg/dL — ABNORMAL HIGH (ref 0–200)
HDL: 58.6 mg/dL (ref >39.00)
LDL Cholesterol: 176 mg/dL — ABNORMAL HIGH (ref 0–99)
TRIGLYCERIDES: 99 mg/dL (ref 0.0–149.0)
Total CHOL/HDL Ratio: 4
VLDL: 19.8 mg/dL (ref 0.0–40.0)

## 2013-03-26 LAB — CBC WITH DIFFERENTIAL/PLATELET
BASOS ABS: 0 10*3/uL (ref 0.0–0.1)
Basophils Relative: 0.2 % (ref 0.0–3.0)
Eosinophils Absolute: 0 10*3/uL (ref 0.0–0.7)
Eosinophils Relative: 0.1 % (ref 0.0–5.0)
HCT: 46.3 % (ref 39.0–52.0)
Hemoglobin: 15.5 g/dL (ref 13.0–17.0)
LYMPHS PCT: 10.8 % — AB (ref 12.0–46.0)
Lymphs Abs: 1.4 10*3/uL (ref 0.7–4.0)
MCHC: 33.5 g/dL (ref 30.0–36.0)
MCV: 89.5 fl (ref 78.0–100.0)
Monocytes Absolute: 0.6 10*3/uL (ref 0.1–1.0)
Monocytes Relative: 4.8 % (ref 3.0–12.0)
NEUTROS PCT: 84.1 % — AB (ref 43.0–77.0)
Neutro Abs: 11.3 10*3/uL — ABNORMAL HIGH (ref 1.4–7.7)
PLATELETS: 325 10*3/uL (ref 150.0–400.0)
RBC: 5.17 Mil/uL (ref 4.22–5.81)
RDW: 14.3 % (ref 11.5–14.6)
WBC: 13.4 10*3/uL — ABNORMAL HIGH (ref 4.5–10.5)

## 2013-03-26 LAB — TSH: TSH: 0.24 u[IU]/mL — ABNORMAL LOW (ref 0.35–5.50)

## 2013-03-26 NOTE — Patient Instructions (Signed)
You need to reduce your calorie intake- particularly reduce your rice intake.  We will check your lab work today.  Let me know if you have increased chest pain  I will see you in 6 months.

## 2013-03-26 NOTE — Addendum Note (Signed)
Addended by: Burnett Kanaris on: 03/26/2013 09:50 AM   Modules accepted: Orders

## 2013-03-26 NOTE — Progress Notes (Signed)
[name] Date of Birth: [birth year] Medical Record #[id number]  History of Present Illness: [name] is seen back today for a follow up visit. He is seen with an interpreter. 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 EF was normal. In June of 2014 he had a cath following abnormal Myoview with class III angina and inferior ischemia. He had 2 bare metal Veriflex to a 90% proximal RCA and 80% PLOM branch respectively.Other issues include HLD, HTN, tobacco abuse- quit in 2010, COPD (on chronic steroid therapy) and GERD.  Breathing better off Zbeta. No cough. He reports 3 episodes of angina since December relieved readily with sl Ntg. Complains of feeling full and heavy. He has gained 5 lbs.  Current Outpatient Prescriptions  Medication Sig Dispense Refill  . albuterol (PROVENTIL HFA) 108 (90 BASE) MCG/ACT inhaler Inhale 2 puffs into the lungs every 6 (six) hours as needed for wheezing or shortness of breath.  6.7 each  4  . albuterol (PROVENTIL) (2.5 MG/3ML) 0.083% nebulizer solution Take 3 mLs (2.5 mg total) by nebulization 2 (two) times daily. And as needed. Dx 496,  FILE MCR PART B  270 mL  11  . amLODipine (NORVASC) 5 MG tablet Take 1 tablet (5 mg total) by mouth daily.  30 tablet  11  . aspirin 81 MG tablet Take 81 mg by mouth daily as needed.       [name] atorvastatin (LIPITOR) 40 MG tablet TAKE 1 TABLET (40 MG TOTAL) BY MOUTH DAILY.  30 tablet  3  . clopidogrel (PLAVIX) 75 MG tablet Take 1 tablet (75 mg total) by mouth daily with breakfast.  30 tablet  1  . KLOR-CON M20 20 MEQ tablet TAKE 1 TABLET (20 MEQ TOTAL) BY MOUTH DAILY.  30 tablet  1  . nitroGLYCERIN (NITROSTAT) 0.4 MG SL tablet Place 1 tablet (0.4 mg total) under the tongue every 5 (five) minutes as needed for chest pain.  25 tablet  11  . omeprazole (PRILOSEC) 20 MG capsule TAKE ONE CAPSULE BY MOUTH EVERY DAY  30 capsule  5  . pantoprazole (PROTONIX) 40 MG tablet Take 1 tablet (40 mg total) by mouth daily at 6 (six) AM.  30  tablet  3  . predniSONE (DELTASONE) 10 MG tablet TAKE 1 TABLET (10 MG TOTAL) BY MOUTH DAILY.  30 tablet  4  . QVAR 80 MCG/ACT inhaler INHALE 2 PUFFS INTO THE LUNGS 2 (TWO) TIMES DAILY.  8.7 g  5  . SPIRIVA HANDIHALER 18 MCG inhalation capsule Place 18 mcg into inhaler and inhale daily.       . [DISCONTINUED] arformoterol (BROVANA) 15 MCG/2ML NEBU Take 2 mLs (15 mcg total) by nebulization 2 (two) times daily. Dx code: 496  120 mL  11  . [DISCONTINUED] budesonide (PULMICORT) 0.5 MG/2ML nebulizer solution Take 2 mLs (0.5 mg total) by nebulization 2 (two) times daily. DX:  496  120 mL  11  . [DISCONTINUED] esomeprazole (NEXIUM) 40 MG capsule Take 1 capsule (40 mg total) by mouth daily.  30 capsule  6   No current facility-administered medications for this visit.    No Known Allergies  Past Medical History  Diagnosis Date  . COPD (chronic obstructive pulmonary disease)     former smoker  . GERD (gastroesophageal reflux disease)   . CAD (coronary artery disease), native coronary artery 6/11, 2/14, 6/14    a. thought due to vasospasm . cath - CAD LAD 40-50%, LCX 40%, RCA 25%  EF 65%. lexiscan myoview EF 68% w/o evidence of ischemia or infarct b. STEMI s/p BMS-mid RCA c. BMS x 2- prox RCA, PLOM  . HTN (hypertension)   . Asthma   . Hyperlipidemia   . Tobacco abuse   . Allergic rhinitis     Past Surgical History  Procedure Laterality Date  . Left foot surgery      repair - and ankle   . Coronary angioplasty with stent placement  02/15/12    20% distal LM, 30-40% mid LAD, 50% ostial diag, 50-60% mid LCx, 99% focal mid RCA stenosis s/p BMS; 20% distal RCA disease, 50% prox PLB; LVEF 55-65%  . Coronary angioplasty with stent placement  07/01/12    30% prox LAD, 30% mid LAD, 20% mid LCx, 90% prox RCA s/p BMS, patent mid RCA stent, 30-40% distal RCA, 80% PLB s/p BMS; EF 55-65%    History  Smoking status  . Former Smoker -- 1.00 packs/day for 50 years  . Types: Cigarettes  . Quit date:  01/09/2008  Smokeless tobacco  . Never Used    Comment: rolled own cigarettes since age of 68,     History  Alcohol Use No    Comment: occassional     History reviewed. No pertinent family history.  Review of Systems: The review of systems is per the HPI.  All other systems were reviewed and are negative.  Physical Exam: BP 132/90  Pulse 68  Ht 5\' 4"  (1.626 m)  Wt 148 lb (67.132 kg)  BMI 25.39 kg/m2 Patient is very pleasant and in no acute distress. Skin is warm and dry. Color is normal.  HEENT is unremarkable. Normocephalic/atraumatic. PERRL. Sclera are nonicteric. Neck is supple. Some soft tissue swelling at base of neck anteriorly. No JVD. Lungs are clear. Cardiac exam shows a regular rate and rhythm. Abdomen is soft. Extremities are without edema. Gait and ROM are intact. No gross neurologic deficits noted.  LABORATORY DATA:   Chemistry      Component Value Date/Time   NA 139 10/01/2012 0918   K 3.5 10/01/2012 0918   CL 104 10/01/2012 0918   CO2 32 08/22/2012 0955   BUN 17 10/01/2012 0918   CREATININE 0.90 10/01/2012 0918      Component Value Date/Time   CALCIUM 8.8 08/22/2012 0955   ALKPHOS 55 10/01/2012 0908   AST 23 10/01/2012 0908   ALT 30 10/01/2012 0908   BILITOT 0.4 10/01/2012 0908     Lab Results  Component Value Date   WBC 9.3 10/01/2012   HGB 16.7 10/01/2012   HCT 49.0 10/01/2012   MCV 87.6 10/01/2012   PLT 299 10/01/2012   Lab Results  Component Value Date   CHOL 209* 08/22/2012   HDL 37.30* 08/22/2012   LDLCALC 89 06/18/2012   LDLDIRECT 148.5 08/22/2012   TRIG 163.0* 08/22/2012   CHOLHDL 6 08/22/2012    Wt Readings from Last 3 Encounters:  03/26/13 148 lb (67.132 kg)  12/18/12 143 lb (64.864 kg)  10/22/12 141 lb 3.2 oz (64.048 kg)     Assessment / Plan: 1. CAD - with stenting of the proximal RCA and PLOM with BMS 6/14, stenting of the mid RCA 2/14- remains on Plavix - tolerating his current regimen without any trouble. Infrequent symptoms of angina.  Consider stopping plavix in June 2015.   2. HTN - BP well controlled.  3. ED   4. HLD - now on lipitor -will check fasting lab work today.   5.  COPD- followed by Dr. Joya Gaskins.  6. Weight gain. Will check TFTs today. I suspect this is related to chronic steroids.

## 2013-03-27 ENCOUNTER — Other Ambulatory Visit: Payer: Self-pay

## 2013-03-27 DIAGNOSIS — E785 Hyperlipidemia, unspecified: Secondary | ICD-10-CM

## 2013-03-27 MED ORDER — ATORVASTATIN CALCIUM 80 MG PO TABS: 80.0000 mg | ORAL_TABLET | Freq: Every day | ORAL | Status: DC

## 2013-04-30 ENCOUNTER — Other Ambulatory Visit: Payer: Self-pay | Admitting: Cardiology

## 2013-05-14 ENCOUNTER — Other Ambulatory Visit: Payer: Self-pay | Admitting: Critical Care Medicine

## 2013-05-19 ENCOUNTER — Other Ambulatory Visit: Payer: Self-pay | Admitting: Critical Care Medicine

## 2013-06-09 ENCOUNTER — Other Ambulatory Visit: Payer: Self-pay | Admitting: Cardiology

## 2013-06-18 ENCOUNTER — Other Ambulatory Visit: Payer: Self-pay | Admitting: Critical Care Medicine

## 2013-06-29 ENCOUNTER — Other Ambulatory Visit (INDEPENDENT_AMBULATORY_CARE_PROVIDER_SITE_OTHER): Payer: Commercial Managed Care - HMO

## 2013-06-29 DIAGNOSIS — E785 Hyperlipidemia, unspecified: Secondary | ICD-10-CM

## 2013-06-29 LAB — HEPATIC FUNCTION PANEL
ALT: 25 U/L (ref 0–53)
AST: 22 U/L (ref 0–37)
Albumin: 4 g/dL (ref 3.5–5.2)
Alkaline Phosphatase: 46 U/L (ref 39–117)
BILIRUBIN DIRECT: 0 mg/dL (ref 0.0–0.3)
TOTAL PROTEIN: 6.5 g/dL (ref 6.0–8.3)
Total Bilirubin: 0.7 mg/dL (ref 0.2–1.2)

## 2013-06-29 LAB — LIPID PANEL
CHOL/HDL RATIO: 4
Cholesterol: 231 mg/dL — ABNORMAL HIGH (ref 0–200)
HDL: 52.6 mg/dL (ref >39.00)
LDL Cholesterol: 141 mg/dL — ABNORMAL HIGH (ref 0–99)
NonHDL: 178.4
Triglycerides: 188 mg/dL — ABNORMAL HIGH (ref 0.0–149.0)
VLDL: 37.6 mg/dL (ref 0.0–40.0)

## 2013-07-07 ENCOUNTER — Other Ambulatory Visit: Payer: Self-pay

## 2013-07-07 DIAGNOSIS — I2510 Atherosclerotic heart disease of native coronary artery without angina pectoris: Secondary | ICD-10-CM

## 2013-07-07 DIAGNOSIS — E785 Hyperlipidemia, unspecified: Secondary | ICD-10-CM

## 2013-07-07 MED ORDER — EZETIMIBE 10 MG PO TABS: 10.0000 mg | ORAL_TABLET | Freq: Every day | ORAL | Status: DC

## 2013-08-01 ENCOUNTER — Inpatient Hospital Stay (HOSPITAL_COMMUNITY)
Admission: EM | Admit: 2013-08-01 | Discharge: 2013-08-04 | DRG: 192 | Disposition: A | Discharge status: Home / Self Care | Payer: Medicare HMO | Attending: Cardiology | Admitting: Cardiology

## 2013-08-01 ENCOUNTER — Encounter (HOSPITAL_COMMUNITY): Payer: Self-pay | Admitting: Emergency Medicine

## 2013-08-01 ENCOUNTER — Emergency Department (HOSPITAL_COMMUNITY): Payer: Medicare HMO

## 2013-08-01 DIAGNOSIS — Z87891 Personal history of nicotine dependence: Secondary | ICD-10-CM | POA: Diagnosis not present

## 2013-08-01 DIAGNOSIS — I252 Old myocardial infarction: Secondary | ICD-10-CM | POA: Diagnosis not present

## 2013-08-01 DIAGNOSIS — IMO0002 Reserved for concepts with insufficient information to code with codable children: Secondary | ICD-10-CM | POA: Diagnosis not present

## 2013-08-01 DIAGNOSIS — D72829 Elevated white blood cell count, unspecified: Secondary | ICD-10-CM | POA: Diagnosis not present

## 2013-08-01 DIAGNOSIS — K219 Gastro-esophageal reflux disease without esophagitis: Secondary | ICD-10-CM | POA: Diagnosis present

## 2013-08-01 DIAGNOSIS — Z7902 Long term (current) use of antithrombotics/antiplatelets: Secondary | ICD-10-CM | POA: Diagnosis not present

## 2013-08-01 DIAGNOSIS — Z9861 Coronary angioplasty status: Secondary | ICD-10-CM | POA: Diagnosis not present

## 2013-08-01 DIAGNOSIS — J441 Chronic obstructive pulmonary disease with (acute) exacerbation: Secondary | ICD-10-CM | POA: Diagnosis not present

## 2013-08-01 DIAGNOSIS — I10 Essential (primary) hypertension: Secondary | ICD-10-CM | POA: Diagnosis present

## 2013-08-01 DIAGNOSIS — E876 Hypokalemia: Secondary | ICD-10-CM | POA: Diagnosis not present

## 2013-08-01 DIAGNOSIS — E785 Hyperlipidemia, unspecified: Secondary | ICD-10-CM | POA: Diagnosis present

## 2013-08-01 DIAGNOSIS — R079 Chest pain, unspecified: Secondary | ICD-10-CM | POA: Diagnosis present

## 2013-08-01 DIAGNOSIS — Z7982 Long term (current) use of aspirin: Secondary | ICD-10-CM

## 2013-08-01 DIAGNOSIS — I200 Unstable angina: Secondary | ICD-10-CM | POA: Diagnosis present

## 2013-08-01 DIAGNOSIS — J449 Chronic obstructive pulmonary disease, unspecified: Secondary | ICD-10-CM

## 2013-08-01 DIAGNOSIS — I2510 Atherosclerotic heart disease of native coronary artery without angina pectoris: Secondary | ICD-10-CM | POA: Diagnosis present

## 2013-08-01 DIAGNOSIS — J45901 Unspecified asthma with (acute) exacerbation: Principal | ICD-10-CM

## 2013-08-01 DIAGNOSIS — E1169 Type 2 diabetes mellitus with other specified complication: Secondary | ICD-10-CM | POA: Diagnosis present

## 2013-08-01 DIAGNOSIS — Z720 Tobacco use: Secondary | ICD-10-CM | POA: Diagnosis present

## 2013-08-01 LAB — CBC
HCT: 44.6 % (ref 39.0–52.0)
Hemoglobin: 15.3 g/dL (ref 13.0–17.0)
MCH: 30.7 pg (ref 26.0–34.0)
MCHC: 34.3 g/dL (ref 30.0–36.0)
MCV: 89.4 fL (ref 78.0–100.0)
Platelets: 248 10*3/uL (ref 150–400)
RBC: 4.99 MIL/uL (ref 4.22–5.81)
RDW: 14.1 % (ref 11.5–15.5)
WBC: 13.2 10*3/uL — AB (ref 4.0–10.5)

## 2013-08-01 LAB — BASIC METABOLIC PANEL
Anion gap: 15 (ref 5–15)
BUN: 13 mg/dL (ref 6–23)
CALCIUM: 8.6 mg/dL (ref 8.4–10.5)
CO2: 25 mEq/L (ref 19–32)
Chloride: 100 mEq/L (ref 96–112)
Creatinine, Ser: 0.84 mg/dL (ref 0.50–1.35)
GFR calc Af Amer: >90 mL/min (ref >90)
GFR, EST NON AFRICAN AMERICAN: 88 mL/min — AB (ref >90)
GLUCOSE: 92 mg/dL (ref 70–99)
POTASSIUM: 3.6 meq/L — AB (ref 3.7–5.3)
SODIUM: 140 meq/L (ref 137–147)

## 2013-08-01 LAB — PRO B NATRIURETIC PEPTIDE: Pro B Natriuretic peptide (BNP): 60.5 pg/mL (ref 0–125)

## 2013-08-01 LAB — TROPONIN I: Troponin I: <0.3 ng/mL (ref <0.30)

## 2013-08-01 LAB — PROTIME-INR
INR: 0.9 (ref 0.00–1.49)
Prothrombin Time: 12.2 seconds (ref 11.6–15.2)

## 2013-08-01 LAB — I-STAT TROPONIN, ED: Troponin i, poc: 0.01 ng/mL (ref 0.00–0.08)

## 2013-08-01 LAB — D-DIMER, QUANTITATIVE (NOT AT ARMC)

## 2013-08-01 MED ORDER — ALBUTEROL SULFATE (2.5 MG/3ML) 0.083% IN NEBU: 2.5000 mg | INHALATION_SOLUTION | RESPIRATORY_TRACT | Status: DC | PRN

## 2013-08-01 MED ORDER — HEPARIN (PORCINE) IN NACL 100-0.45 UNIT/ML-% IJ SOLN
850.0000 [IU]/h | INTRAMUSCULAR | Status: DC
  Administered 2013-08-01: 1000 [IU]/h via INTRAVENOUS
  Administered 2013-08-02: 800 [IU]/h via INTRAVENOUS
  Filled 2013-08-01 (×4): qty 250

## 2013-08-01 MED ORDER — SODIUM CHLORIDE 0.9 % IJ SOLN: 3.0000 mL | Freq: Two times a day (BID) | INTRAMUSCULAR | Status: DC

## 2013-08-01 MED ORDER — AMLODIPINE BESYLATE 10 MG PO TABS
10.0000 mg | ORAL_TABLET | Freq: Every day | ORAL | Status: DC
  Administered 2013-08-02 – 2013-08-04 (×3): 10 mg via ORAL
  Filled 2013-08-01 (×3): qty 1

## 2013-08-01 MED ORDER — NITROGLYCERIN 0.4 MG SL SUBL
0.4000 mg | SUBLINGUAL_TABLET | SUBLINGUAL | Status: DC | PRN
  Administered 2013-08-01 (×2): 0.4 mg via SUBLINGUAL
  Filled 2013-08-01: qty 1

## 2013-08-01 MED ORDER — BUDESONIDE-FORMOTEROL FUMARATE 160-4.5 MCG/ACT IN AERO
2.0000 | INHALATION_SPRAY | Freq: Two times a day (BID) | RESPIRATORY_TRACT | Status: DC
  Administered 2013-08-01 – 2013-08-04 (×6): 2 via RESPIRATORY_TRACT
  Filled 2013-08-01: qty 6

## 2013-08-01 MED ORDER — TIOTROPIUM BROMIDE MONOHYDRATE 18 MCG IN CAPS
18.0000 ug | ORAL_CAPSULE | Freq: Every day | RESPIRATORY_TRACT | Status: DC
Start: 2013-08-02 — End: 2013-08-04
  Administered 2013-08-02 – 2013-08-04 (×3): 18 ug via RESPIRATORY_TRACT
  Filled 2013-08-01: qty 5

## 2013-08-01 MED ORDER — ACETAMINOPHEN 325 MG PO TABS: 650.0000 mg | ORAL_TABLET | ORAL | Status: DC | PRN

## 2013-08-01 MED ORDER — ATORVASTATIN CALCIUM 80 MG PO TABS
80.0000 mg | ORAL_TABLET | Freq: Every day | ORAL | Status: DC
  Administered 2013-08-01 – 2013-08-04 (×4): 80 mg via ORAL
  Filled 2013-08-01 (×4): qty 1

## 2013-08-01 MED ORDER — ASPIRIN EC 81 MG PO TBEC
81.0000 mg | DELAYED_RELEASE_TABLET | Freq: Every day | ORAL | Status: DC
  Administered 2013-08-01 – 2013-08-04 (×4): 81 mg via ORAL
  Filled 2013-08-01 (×4): qty 1

## 2013-08-01 MED ORDER — SODIUM CHLORIDE 0.9 % IJ SOLN: 3.0000 mL | INTRAMUSCULAR | Status: DC | PRN

## 2013-08-01 MED ORDER — ASPIRIN 81 MG PO CHEW
324.0000 mg | CHEWABLE_TABLET | Freq: Once | ORAL | Status: AC
  Administered 2013-08-01: 324 mg via ORAL
  Filled 2013-08-01: qty 4

## 2013-08-01 MED ORDER — ONDANSETRON HCL 4 MG/2ML IJ SOLN
4.0000 mg | Freq: Four times a day (QID) | INTRAMUSCULAR | Status: DC | PRN
  Administered 2013-08-02: 4 mg via INTRAVENOUS
  Filled 2013-08-01: qty 2

## 2013-08-01 MED ORDER — ALBUTEROL SULFATE (2.5 MG/3ML) 0.083% IN NEBU
2.5000 mg | INHALATION_SOLUTION | Freq: Four times a day (QID) | RESPIRATORY_TRACT | Status: DC
  Administered 2013-08-01: 2.5 mg via RESPIRATORY_TRACT
  Filled 2013-08-01: qty 3

## 2013-08-01 MED ORDER — SODIUM CHLORIDE 0.9 % IV SOLN: 250.0000 mL | INTRAVENOUS | Status: DC | PRN

## 2013-08-01 MED ORDER — EZETIMIBE 10 MG PO TABS
10.0000 mg | ORAL_TABLET | Freq: Every day | ORAL | Status: DC
  Administered 2013-08-01 – 2013-08-03 (×3): 10 mg via ORAL
  Filled 2013-08-01 (×3): qty 1

## 2013-08-01 MED ORDER — POTASSIUM CHLORIDE CRYS ER 20 MEQ PO TBCR
20.0000 meq | EXTENDED_RELEASE_TABLET | Freq: Every day | ORAL | Status: DC
  Administered 2013-08-02 – 2013-08-04 (×3): 20 meq via ORAL
  Filled 2013-08-01 (×3): qty 1

## 2013-08-01 MED ORDER — HEPARIN BOLUS VIA INFUSION
4000.0000 [IU] | Freq: Once | INTRAVENOUS | Status: AC
  Administered 2013-08-01: 4000 [IU] via INTRAVENOUS
  Filled 2013-08-01: qty 4000

## 2013-08-01 MED ORDER — PANTOPRAZOLE SODIUM 40 MG PO TBEC
40.0000 mg | DELAYED_RELEASE_TABLET | Freq: Every day | ORAL | Status: DC
  Administered 2013-08-01 – 2013-08-04 (×4): 40 mg via ORAL
  Filled 2013-08-01 (×4): qty 1

## 2013-08-01 MED ORDER — PREDNISONE 50 MG PO TABS
60.0000 mg | ORAL_TABLET | Freq: Every day | ORAL | Status: DC
  Administered 2013-08-02 – 2013-08-04 (×3): 60 mg via ORAL
  Filled 2013-08-01 (×4): qty 1

## 2013-08-01 NOTE — ED Notes (Signed)
Pt back from XR. Pt reports no pain but c/o feet still being numb.

## 2013-08-01 NOTE — Plan of Care (Signed)
Problem: Consults Goal: Cardiac Cath Patient Education (See Patient Education module for education specifics.) Outcome: Not Met (add Reason) Not met at this time due to language barrier. Goal: Tobacco Cessation referral if indicated Outcome: Not Met (add Reason) Not met due to language barrier

## 2013-08-01 NOTE — H&P (Signed)
Physician History and Physical    Patient ID: [name] MRN: [id number] DOB/AGE: 05-26-1945 68 y.o. Admit date: 08/01/2013  Primary Care Physician: [name], MD Primary Cardiologist Elaina Cara [name] MD  HPI: Mr. [name] is well known to me. He is seen with family who act as interpreter. He has been experiencing progressive chest pain over the past 2 weeks. This is described as a burning in the epigastric region radiating into his chest. He also has pressure and SOB. Symptoms relieved with sl Ntg. He has COPD and does note more wheezing. Chest pain symptoms are similar to prior MI but not as severe. He is not smoking. Sometimes he skips his medication because it makes him jittery. Plavix was stopped in June since he was a year out from his stent. 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 EF was normal. In June of 2014 he had a cath following abnormal Myoview with class III angina and inferior ischemia. He had 2 bare metal Veriflex to a 90% proximal RCA and 80% PLOM branch respectively.Other issues include HLD, HTN, tobacco abuse- quit in 2010, COPD (on chronic steroid therapy) and GERD. Beta blockers including bisoprolol have made his breathing worse.   Review of systems complete and found to be negative unless listed above  Past Medical History  Diagnosis Date  . COPD (chronic obstructive pulmonary disease)     former smoker  . GERD (gastroesophageal reflux disease)   . CAD (coronary artery disease), native coronary artery 6/11, 2/14, 6/14    a. thought due to vasospasm . cath - CAD LAD 40-50%, LCX 40%, RCA 25% EF 65%. lexiscan myoview EF 68% w/o evidence of ischemia or infarct b. STEMI s/p BMS-mid RCA c. BMS x 2- prox RCA, PLOM  . HTN (hypertension)   . Asthma   . Hyperlipidemia   . Tobacco abuse   . Allergic rhinitis     History reviewed. No pertinent family history.  History   Social History  . Marital Status: Married    Spouse Name: N/A    Number of Children: N/A  .  Years of Education: N/A   Occupational History  . disability    Social History Main Topics  . Smoking status: Former Smoker -- 1.00 packs/day for 50 years    Types: Cigarettes    Quit date: 01/09/2008  . Smokeless tobacco: Never Used     Comment: rolled own cigarettes since age of 80,   . Alcohol Use: No     Comment: occassional   . Drug Use: No  . Sexual Activity: Not Currently     Comment: pt sts he only smoke 1 cigarette, not every day   Other Topics Concern  . Not on file   Social History Narrative   Married, 4 children.   Disability secondary to lung disease.           Past Surgical History  Procedure Laterality Date  . Left foot surgery      repair - and ankle   . Coronary angioplasty with stent placement  02/15/12    20% distal LM, 30-40% mid LAD, 50% ostial diag, 50-60% mid LCx, 99% focal mid RCA stenosis s/p BMS; 20% distal RCA disease, 50% prox PLB; LVEF 55-65%  . Coronary angioplasty with stent placement  07/01/12    30% prox LAD, 30% mid LAD, 20% mid LCx, 90% prox RCA s/p BMS, patent mid RCA stent, 30-40% distal RCA, 80% PLB s/p BMS; EF 55-65%  Medication List    ASK your doctor about these medications       albuterol 108 (90 BASE) MCG/ACT inhaler  Commonly known as:  PROVENTIL HFA;VENTOLIN HFA  Inhale 2 puffs into the lungs every 6 (six) hours as needed for wheezing or shortness of breath.     albuterol (2.5 MG/3ML) 0.083% nebulizer solution  Commonly known as:  PROVENTIL  Take 3 mLs (2.5 mg total) by nebulization 2 (two) times daily. And as needed. Dx 496,  FILE MCR PART B     amLODipine 5 MG tablet  Commonly known as:  NORVASC  Take 10 mg by mouth daily.     aspirin 81 MG tablet  Take 81 mg by mouth daily as needed.     budesonide-formoterol 160-4.5 MCG/ACT inhaler  Commonly known as:  SYMBICORT  Inhale 2 puffs into the lungs 2 (two) times daily.     ezetimibe 10 MG tablet  Commonly known as:  ZETIA  Take 1 tablet (10 mg total) by mouth  daily.     nitroGLYCERIN 0.4 MG SL tablet  Commonly known as:  NITROSTAT  Place 1 tablet (0.4 mg total) under the tongue every 5 (five) minutes as needed for chest pain.     omeprazole 20 MG capsule  Commonly known as:  PRILOSEC  Take 20 mg by mouth daily.     potassium chloride SA 20 MEQ tablet  Commonly known as:  K-DUR,KLOR-CON  Take 20 mEq by mouth daily.     predniSONE 10 MG tablet  Commonly known as:  DELTASONE  Take 10 mg by mouth daily with breakfast.     SPIRIVA HANDIHALER 18 MCG inhalation capsule  Generic drug:  tiotropium  Place 18 mcg into inhaler and inhale daily.        Physical Exam: Blood pressure 125/75, pulse 76, temperature 98.6 F (37 C), temperature source Oral, resp. rate 20, SpO2 96.00%. Current Weight  03/26/13 148 lb (67.132 kg)  12/18/12 143 lb (64.864 kg)  10/22/12 141 lb 3.2 oz (64.048 kg)    GENERAL:  Well appearing asian male in NAD HEENT:  PERRL, EOMI, sclera are clear. Oropharynx is clear. NECK:  No jugular venous distention, carotid upstroke brisk and symmetric, no bruits, no thyromegaly or adenopathy LUNGS:  Bilateral diffuse wheezing. CHEST:  Unremarkable HEART:  RRR,  PMI not displaced or sustained,S1 and S2 within normal limits, no S3, no S4: no clicks, no rubs, no murmurs ABD:  Soft, nontender. BS +, no masses or bruits. No hepatomegaly, no splenomegaly EXT:  2 + pulses throughout, no edema, no cyanosis no clubbing SKIN:  Warm and dry.  No rashes NEURO:  Alert and oriented x 3. Cranial nerves II through XII intact. PSYCH:  Cognitively intact    Labs:   Lab Results  Component Value Date   WBC 13.2* 08/01/2013   HGB 15.3 08/01/2013   HCT 44.6 08/01/2013   MCV 89.4 08/01/2013   PLT 248 08/01/2013    Recent Labs Lab 08/01/13 1155  NA 140  K 3.6*  CL 100  CO2 25  BUN 13  CREATININE 0.84  CALCIUM 8.6  GLUCOSE 92   Lab Results  Component Value Date   CKMB 2.4 12/21/2010   CKMB 2.5 12/21/2010   CKMB 2.0 12/21/2010    TROPONINI <0.30 10/01/2012   TROPONINI <0.30 10/01/2012   TROPONINI <0.30 12/21/2010    Lab Results  Component Value Date   CHOL 231* 06/29/2013   CHOL 254* 03/26/2013  CHOL 209* 08/22/2012   Lab Results  Component Value Date   HDL 52.60 06/29/2013   HDL 58.60 03/26/2013   HDL 37.30* 08/22/2012   Lab Results  Component Value Date   LDLCALC 141* 06/29/2013   LDLCALC 176* 03/26/2013   LDLCALC 89 06/18/2012   Lab Results  Component Value Date   TRIG 188.0* 06/29/2013   TRIG 99.0 03/26/2013   TRIG 163.0* 08/22/2012   Lab Results  Component Value Date   CHOLHDL 4 06/29/2013   CHOLHDL 4 03/26/2013   CHOLHDL 6 08/22/2012   Lab Results  Component Value Date   LDLDIRECT 148.5 08/22/2012   LDLDIRECT 142.6 03/17/2012    Lab Results  Component Value Date   PROBNP 60.5 08/01/2013   PROBNP 65.1 06/02/2009   Lab Results  Component Value Date   TSH 0.24* 03/26/2013   Lab Results  Component Value Date   HGBA1C 6.2* 02/16/2012   D-dimer < 0.27  Radiology: CLINICAL DATA: Shortness of breath.  EXAM:  CHEST 2 VIEW  COMPARISON: 10/01/2012.  FINDINGS:  The cardiac silhouette, mediastinal and hilar contours are within  normal limits and stable. The lungs demonstrate mild chronic  emphysematous changes and pulmonary scarring. No acute infiltrate,  mass or pleural effusion.  IMPRESSION:  Chronic lung changes but no acute overlying pulmonary process.  Electronically Signed  By: Kalman Jewels M.D.  On: 08/01/2013 13:25  EKG: NSR, normal Ecg  ASSESSMENT AND PLAN:  1. Unstable angina pectoris. Patient has had 2 prior PCIs of the RCA with BMS. Higher risk for restenosis. Will anticoagulate with IV heparin. Check serial enzymes and Ecg. Plan cardiac cath/ possible PCI on Monday. Not a candidate for beta blocker due to COPD with active wheezing. Continue ASA. Statin therapy.  2. COPD exacerbation with bronchospasm. Rx with albuterol nebs. Steroid taper. Not febrile. No cough and CXR clear so will  not start antibiotics at this point.  3. CAD s/p stenting of RCA as noted.  4. Hyperlipidemia. On lipitor and Zetia.  5. HTN  6. Tobacco abuse. Patient reports he is not smoking now.   Signed: Zacory Fiola [name], [place]  08/01/2013, 2:24 PM

## 2013-08-01 NOTE — ED Notes (Signed)
Pt transport to xray.

## 2013-08-01 NOTE — ED Provider Notes (Signed)
CSN: [id number]     Arrival date & time 08/01/13  1139 History   First MD Initiated Contact with Patient 08/01/13 1156     Chief Complaint  Patient presents with  . Chest Pain  . Shortness of Breath     (Consider location/radiation/quality/duration/timing/severity/associated sxs/prior Treatment) The history is provided by the patient and medical records. The history is limited by a language barrier. A language interpreter was used.   This is a 68 y.o. M with PMH significant for COPD, HTN, HLP, CAD, MI x2 with stenting, presenting to the ED for generalized chest heaviness and shortness of breath over the past 2 weeks. Patient states symptoms have been intermittent, variable in severity.  No alleviating or exacerbating factors.  He does not typically experience anginal pain. Denies palpitations, diaphoresis, nausea, vomiting, or abdominal pain.  No pain of neck of upper extremities.  Pt also notes some mild dizziness and lightheadedness.  He has noticed some slight swelling of BLE and states they feel very heavy.  Denies significant weight gain or night time orthopnea.  No recent illness, fever, chills, sweats. Pt is not a smoker.  Patient stopped plavix last month.  Pt is a former smoker.  He follows with cardiology, Dr. [name].  Past Medical History  Diagnosis Date  . COPD (chronic obstructive pulmonary disease)     former smoker  . GERD (gastroesophageal reflux disease)   . CAD (coronary artery disease), native coronary artery 6/11, 2/14, 6/14    a. thought due to vasospasm . cath - CAD LAD 40-50%, LCX 40%, RCA 25% EF 65%. lexiscan myoview EF 68% w/o evidence of ischemia or infarct b. STEMI s/p BMS-mid RCA c. BMS x 2- prox RCA, PLOM  . HTN (hypertension)   . Asthma   . Hyperlipidemia   . Tobacco abuse   . Allergic rhinitis    Past Surgical History  Procedure Laterality Date  . Left foot surgery      repair - and ankle   . Coronary angioplasty with stent placement  02/15/12    20%  distal LM, 30-40% mid LAD, 50% ostial diag, 50-60% mid LCx, 99% focal mid RCA stenosis s/p BMS; 20% distal RCA disease, 50% prox PLB; LVEF 55-65%  . Coronary angioplasty with stent placement  07/01/12    30% prox LAD, 30% mid LAD, 20% mid LCx, 90% prox RCA s/p BMS, patent mid RCA stent, 30-40% distal RCA, 80% PLB s/p BMS; EF 55-65%   History reviewed. No pertinent family history. History  Substance Use Topics  . Smoking status: Former Smoker -- 1.00 packs/day for 50 years    Types: Cigarettes    Quit date: 01/09/2008  . Smokeless tobacco: Never Used     Comment: rolled own cigarettes since age of 77,   . Alcohol Use: No     Comment: occassional     Review of Systems  Respiratory: Positive for shortness of breath.   Cardiovascular: Positive for chest pain.  Neurological: Positive for dizziness and light-headedness.  All other systems reviewed and are negative.     Allergies  Review of patient's allergies indicates no known allergies.  Home Medications   Prior to Admission medications   Medication Sig Start Date End Date Taking? Authorizing Provider  albuterol (PROVENTIL) (2.5 MG/3ML) 0.083% nebulizer solution Take 3 mLs (2.5 mg total) by nebulization 2 (two) times daily. And as needed. Dx 496,  FILE MCR PART B 10/22/12   Elsie Stain, MD  amLODipine (NORVASC) 5 MG  tablet TAKE 1 TABLET (5 MG TOTAL) BY MOUTH DAILY. 06/09/13   Peter M [name], MD  aspirin 81 MG tablet Take 81 mg by mouth daily as needed.     Historical Provider, MD  atorvastatin (LIPITOR) 80 MG tablet Take 1 tablet (80 mg total) by mouth daily. 03/27/13   Peter M [name], MD  clopidogrel (PLAVIX) 75 MG tablet Take 1 tablet (75 mg total) by mouth daily with breakfast. 07/02/12   Roger A Arguello, PA-C  ezetimibe (ZETIA) 10 MG tablet Take 1 tablet (10 mg total) by mouth daily. 07/07/13   Peter M [name], MD  KLOR-CON M20 20 MEQ tablet TAKE 1 TABLET BY MOUTH DAILY. 04/30/13   Peter M [name], MD  nitroGLYCERIN (NITROSTAT)  0.4 MG SL tablet Place 1 tablet (0.4 mg total) under the tongue every 5 (five) minutes as needed for chest pain. 06/18/12   Peter M [name], MD  omeprazole (PRILOSEC) 20 MG capsule TAKE ONE CAPSULE BY MOUTH EVERY DAY 06/18/13   Elsie Stain, MD  pantoprazole (PROTONIX) 40 MG tablet Take 1 tablet (40 mg total) by mouth daily at 6 (six) AM. 07/02/12   Roger A Arguello, PA-C  predniSONE (DELTASONE) 10 MG tablet TAKE 1 TABLET (10 MG TOTAL) BY MOUTH DAILY. 05/14/13   Elsie Stain, MD  PROVENTIL HFA 108 (90 BASE) MCG/ACT inhaler INHALE 2 PUFFS INTO THE LUNGS EVERY 6 HOURS AS NEEDED FOR WHEEZING OR SHORTNESS OF BREATH 05/19/13   Elsie Stain, MD  QVAR 80 MCG/ACT inhaler INHALE 2 PUFFS INTO THE LUNGS 2 (TWO) TIMES DAILY. 03/06/13   Elsie Stain, MD  SPIRIVA HANDIHALER 18 MCG inhalation capsule Place 18 mcg into inhaler and inhale daily.  08/18/12   Historical Provider, MD   BP 129/66  Pulse 86  Temp(Src) 98.6 F (37 C) (Oral)  Resp 22  SpO2 94%  Physical Exam  Nursing note and vitals reviewed. Constitutional: He is oriented to person, place, and time. He appears well-developed and well-nourished. No distress.  HENT:  Head: Normocephalic and atraumatic.  Mouth/Throat: Oropharynx is clear and moist.  Eyes: Conjunctivae and EOM are normal. Pupils are equal, round, and reactive to light.  Neck: Normal range of motion. Neck supple.  Cardiovascular: Normal rate, regular rhythm, normal heart sounds, intact distal pulses and normal pulses.   Palpable pulses to extremities x4  Pulmonary/Chest: Effort normal and breath sounds normal. No respiratory distress. He has no wheezes.  Abdominal: Soft. Bowel sounds are normal. There is no tenderness. There is no guarding.  Musculoskeletal: Normal range of motion. He exhibits no edema.  Trace edema BLE No calf asymmetry, tenderness, or palpable cords; no overlying erythema or warmth to touch  Neurological: He is alert and oriented to person, place, and  time.  Skin: Skin is warm and dry. He is not diaphoretic.  Psychiatric: He has a normal mood and affect.    ED Course  Procedures (including critical care time)  Labs Review Labs Reviewed  BASIC METABOLIC PANEL - Abnormal; Notable for the following:    Potassium 3.6 (*)    GFR calc non Af Amer 88 (*)    All other components within normal limits  CBC - Abnormal; Notable for the following:    WBC 13.2 (*)    All other components within normal limits  PRO B NATRIURETIC PEPTIDE  D-DIMER, QUANTITATIVE  I-STAT TROPOININ, ED    Imaging Review Dg Chest 2 View  08/01/2013   CLINICAL DATA:  Shortness of  breath.  EXAM: CHEST  2 VIEW  COMPARISON:  10/01/2012.  FINDINGS: The cardiac silhouette, mediastinal and hilar contours are within normal limits and stable. The lungs demonstrate mild chronic emphysematous changes and pulmonary scarring. No acute infiltrate, mass or pleural effusion.  IMPRESSION: Chronic lung changes but no acute overlying pulmonary process.   Electronically Signed   By: Kalman Jewels M.D.   On: 08/01/2013 13:25     EKG Interpretation   Date/Time:  Saturday August 01 2013 11:44:02 EDT Ventricular Rate:  87 PR Interval:  136 QRS Duration: 82 QT Interval:  354 QTC Calculation: 425 R Axis:   55 Text Interpretation:  Normal sinus rhythm Normal ECG Confirmed by BEATON   MD, ROBERT ([id number]) on 08/01/2013 12:05:00 PM      MDM   Final diagnoses:  Chest pain, unspecified chest pain type   68 y.o. M with intermittent chest pain and SOB x2 weeks.  Sx variable in severity without alleviating factors.  Pt has known CAD, STEMI February 2014, under care of Dr. [name].  EKG obtained in triage, NSR without acute changes.  Will obtain basic labs, trop, d-dimer, cxr.  ASA and SL NTG given.  Lab work is reassuring.  Trop and d-dimer negative.  BNP WNL.  CXR clear.  After meds pt states he is feeling much better however his sx are concerning for unstable angina given his prior  cardiac hx.  Case discussed with Dr. [name], will admit to cardiology service for further evaluation/management.  Larene Pickett, PA-C 08/01/13 [phone]

## 2013-08-01 NOTE — ED Notes (Signed)
Pt and family member reports pt having sob and chest heaviness for over two weeks. Having dizziness and feeling lightheaded. ekg done at triage.

## 2013-08-02 DIAGNOSIS — I2510 Atherosclerotic heart disease of native coronary artery without angina pectoris: Secondary | ICD-10-CM

## 2013-08-02 DIAGNOSIS — K219 Gastro-esophageal reflux disease without esophagitis: Secondary | ICD-10-CM

## 2013-08-02 DIAGNOSIS — I200 Unstable angina: Secondary | ICD-10-CM

## 2013-08-02 DIAGNOSIS — J441 Chronic obstructive pulmonary disease with (acute) exacerbation: Principal | ICD-10-CM

## 2013-08-02 DIAGNOSIS — I10 Essential (primary) hypertension: Secondary | ICD-10-CM

## 2013-08-02 DIAGNOSIS — J45901 Unspecified asthma with (acute) exacerbation: Principal | ICD-10-CM

## 2013-08-02 LAB — LIPID PANEL
Cholesterol: 172 mg/dL (ref 0–200)
HDL: 52 mg/dL (ref >39)
LDL CALC: 94 mg/dL (ref 0–99)
TRIGLYCERIDES: 129 mg/dL (ref <150)
Total CHOL/HDL Ratio: 3.3 RATIO
VLDL: 26 mg/dL (ref 0–40)

## 2013-08-02 LAB — BASIC METABOLIC PANEL
Anion gap: 13 (ref 5–15)
BUN: 15 mg/dL (ref 6–23)
CO2: 23 mEq/L (ref 19–32)
CREATININE: 0.99 mg/dL (ref 0.50–1.35)
Calcium: 8.3 mg/dL — ABNORMAL LOW (ref 8.4–10.5)
Chloride: 102 mEq/L (ref 96–112)
GFR, EST NON AFRICAN AMERICAN: 82 mL/min — AB (ref >90)
Glucose, Bld: 105 mg/dL — ABNORMAL HIGH (ref 70–99)
Potassium: 4.1 mEq/L (ref 3.7–5.3)
Sodium: 138 mEq/L (ref 137–147)

## 2013-08-02 LAB — TROPONIN I: Troponin I: <0.3 ng/mL (ref <0.30)

## 2013-08-02 LAB — HEPARIN LEVEL (UNFRACTIONATED)
Heparin Unfractionated: 0.44 IU/mL (ref 0.30–0.70)
Heparin Unfractionated: 0.72 IU/mL — ABNORMAL HIGH (ref 0.30–0.70)
Heparin Unfractionated: 0.89 IU/mL — ABNORMAL HIGH (ref 0.30–0.70)

## 2013-08-02 MED ORDER — NITROGLYCERIN IN D5W 200-5 MCG/ML-% IV SOLN: 5.0000 ug/min | INTRAVENOUS | Status: DC

## 2013-08-02 MED ORDER — MORPHINE SULFATE 2 MG/ML IJ SOLN: 2.0000 mg | INTRAMUSCULAR | Status: DC | PRN

## 2013-08-02 NOTE — Progress Notes (Signed)
ANTICOAGULATION CONSULT NOTE - Follow Up Consult  Pharmacy Consult for heparin Indication: chest pain/ACS  No Known Allergies  Patient Measurements: Height: 5' 4.17" (163 cm) Weight: 146 lb (66.225 kg) IBW/kg (Calculated) : 59.6 Heparin Dosing Weight:   Vital Signs: Temp: 98.8 F (37.1 C) (07/26 1546) Temp src: Oral (07/26 1546) BP: 114/55 mmHg (07/26 1546) Pulse Rate: 86 (07/26 1546)  Labs:  Recent Labs  08/01/13 1155 08/01/13 1830 08/01/13 2250 08/02/13 0001 08/02/13 0717 08/02/13 1640  HGB 15.3  --   --   --   --   --   HCT 44.6  --   --   --   --   --   PLT 248  --   --   --   --   --   LABPROT  --  12.2  --   --   --   --   INR  --  0.90  --   --   --   --   HEPARINUNFRC  --   --   --  0.89* 0.72* 0.44  CREATININE 0.84  --   --   --  0.99  --   TROPONINI  --  <0.30 <0.30  --  <0.30  --     Estimated Creatinine Clearance: 60.2 ml/min (by C-G formula based on Cr of 0.99).   Medications:  Scheduled:  . amLODipine  10 mg Oral Daily  . aspirin EC  81 mg Oral Daily  . atorvastatin  80 mg Oral q1800  . budesonide-formoterol  2 puff Inhalation BID  . ezetimibe  10 mg Oral Daily  . pantoprazole  40 mg Oral Daily  . potassium chloride SA  20 mEq Oral Daily  . predniSONE  60 mg Oral Q breakfast  . sodium chloride  3 mL Intravenous Q12H  . sodium chloride  3 mL Intravenous Q12H  . tiotropium  18 mcg Inhalation Daily   Infusions:  . heparin 800 Units/hr (08/02/13 9357)    Assessment: 68 yo male with chest pain receiving anticoagulation with Heparin.  His heparin level is now therapeutic after rate adjustment.  Goal of Therapy:  Heparin level 0.3-0.7 units/ml Monitor platelets by anticoagulation protocol: Yes   Plan:  Continue Heparin at 800 units/hr Check AM Heparin level and CBC  Legrand Como, Pharm.D., BCPS, AAHIVP Clinical Pharmacist Phone: [phone] or [phone] 08/02/2013, 6:00 PM

## 2013-08-02 NOTE — Progress Notes (Signed)
ANTICOAGULATION CONSULT NOTE - Follow Up Consult  Pharmacy Consult for Heparin Indication: chest pain/ACS  No Known Allergies  Patient Measurements: Height: 5' 4.17" (163 cm) Weight: 146 lb (66.225 kg) IBW/kg (Calculated) : 59.6  Vital Signs: Temp: 100.7 F (38.2 C) (07/25 2203) Temp src: Oral (07/25 2203) BP: 133/64 mmHg (07/25 2203) Pulse Rate: 88 (07/25 2203)  Labs:  Recent Labs  08/01/13 1155 08/01/13 1830 08/01/13 2250 08/02/13 0001  HGB 15.3  --   --   --   HCT 44.6  --   --   --   PLT 248  --   --   --   LABPROT  --  12.2  --   --   INR  --  0.90  --   --   HEPARINUNFRC  --   --   --  0.89*  CREATININE 0.84  --   --   --   TROPONINI  --  <0.30 <0.30  --     Estimated Creatinine Clearance: 71 ml/min (by C-G formula based on Cr of 0.84).   Medications:  Prescriptions prior to admission  Medication Sig Dispense Refill  . albuterol (PROVENTIL HFA;VENTOLIN HFA) 108 (90 BASE) MCG/ACT inhaler Inhale 2 puffs into the lungs every 6 (six) hours as needed for wheezing or shortness of breath.      [name] albuterol (PROVENTIL) (2.5 MG/3ML) 0.083% nebulizer solution Take 3 mLs (2.5 mg total) by nebulization 2 (two) times daily. And as needed. Dx 496,  FILE MCR PART B  270 mL  11  . amLODipine (NORVASC) 5 MG tablet Take 10 mg by mouth daily.      [name] aspirin 81 MG tablet Take 81 mg by mouth daily as needed.       . budesonide-formoterol (SYMBICORT) 160-4.5 MCG/ACT inhaler Inhale 2 puffs into the lungs 2 (two) times daily.      [name] ezetimibe (ZETIA) 10 MG tablet Take 1 tablet (10 mg total) by mouth daily.  30 tablet  6  . nitroGLYCERIN (NITROSTAT) 0.4 MG SL tablet Place 1 tablet (0.4 mg total) under the tongue every 5 (five) minutes as needed for chest pain.  25 tablet  11  . omeprazole (PRILOSEC) 20 MG capsule Take 20 mg by mouth daily.      . potassium chloride SA (K-DUR,KLOR-CON) 20 MEQ tablet Take 20 mEq by mouth daily.      . predniSONE (DELTASONE) 10 MG tablet Take 10 mg by  mouth daily with breakfast.      . SPIRIVA HANDIHALER 18 MCG inhalation capsule Place 18 mcg into inhaler and inhale daily.         Assessment: 68 y.o. male with chest pain for heparin   Goal of Therapy:  Heparin level 0.3-0.7 units/ml Monitor platelets by anticoagulation protocol: Yes   Plan:  Decrease Heparin 900 units/hr Follow-up am labs.   Brilynn Biasi, [name] Curb 08/02/2013,12:27 AM

## 2013-08-02 NOTE — ED Provider Notes (Signed)
Medical screening examination/treatment/procedure(s) were performed by non-physician practitioner and as supervising physician I was immediately available for consultation/collaboration.    Dot Lanes, MD 08/02/13 [phone]

## 2013-08-02 NOTE — Progress Notes (Signed)
ANTICOAGULATION CONSULT NOTE - Follow Up Consult  Pharmacy Consult for heparin Indication: chest pain/ACS  No Known Allergies  Patient Measurements: Height: 5' 4.17" (163 cm) Weight: 146 lb (66.225 kg) IBW/kg (Calculated) : 59.6 Heparin Dosing Weight:   Vital Signs: Temp: 99.6 F (37.6 C) (07/26 0500) Temp src: Oral (07/26 0500) BP: 121/70 mmHg (07/26 0500) Pulse Rate: 86 (07/26 0500)  Labs:  Recent Labs  08/01/13 1155 08/01/13 1830 08/01/13 2250 08/02/13 0001 08/02/13 0717  HGB 15.3  --   --   --   --   HCT 44.6  --   --   --   --   PLT 248  --   --   --   --   LABPROT  --  12.2  --   --   --   INR  --  0.90  --   --   --   HEPARINUNFRC  --   --   --  0.89* 0.72*  CREATININE 0.84  --   --   --  0.99  TROPONINI  --  <0.30 <0.30  --  <0.30    Estimated Creatinine Clearance: 60.2 ml/min (by C-G formula based on Cr of 0.99).   Medications:  Scheduled:  . amLODipine  10 mg Oral Daily  . aspirin EC  81 mg Oral Daily  . atorvastatin  80 mg Oral q1800  . budesonide-formoterol  2 puff Inhalation BID  . ezetimibe  10 mg Oral Daily  . pantoprazole  40 mg Oral Daily  . potassium chloride SA  20 mEq Oral Daily  . predniSONE  60 mg Oral Q breakfast  . sodium chloride  3 mL Intravenous Q12H  . sodium chloride  3 mL Intravenous Q12H  . tiotropium  18 mcg Inhalation Daily   Infusions:  . heparin 900 Units/hr (08/02/13 0032)    Assessment: 68 yo male with chest pain is currently on slightly supratherapeutic heparin.  Heparin level is 0.72. Goal of Therapy:  Heparin level 0.3-0.7 units/ml Monitor platelets by anticoagulation protocol: Yes   Plan:  1) Reduce heparin to 800 units/hr.  2) 8hr heparin level  Maricela Schreur, Tsz-Yin 08/02/2013,8:13 AM

## 2013-08-02 NOTE — Progress Notes (Signed)
TELEMETRY: Reviewed telemetry pt in NSR with occ PVC: Filed Vitals:   08/01/13 2203 08/01/13 2300 08/02/13 0500 08/02/13 0813  BP: 133/64  121/70 123/62  Pulse: 88  86 82  Temp: 100.7 F (38.2 C)  99.6 F (37.6 C) 98.6 F (37 C)  TempSrc: Oral  Oral Oral  Resp: 18  18 18   Height:  5' 4.17" (1.63 m)    Weight:      SpO2: 97%   96%    Intake/Output Summary (Last 24 hours) at 08/02/13 0900 Last data filed at 08/02/13 0700  Gross per 24 hour  Intake    480 ml  Output      0 ml  Net    480 ml   Filed Weights   08/01/13 1643  Weight: 146 lb (66.225 kg)    Subjective Complains of chest burning across precordium after eating breakfast. Breathing is better.  [name] amLODipine  10 mg Oral Daily  . aspirin EC  81 mg Oral Daily  . atorvastatin  80 mg Oral q1800  . budesonide-formoterol  2 puff Inhalation BID  . ezetimibe  10 mg Oral Daily  . pantoprazole  40 mg Oral Daily  . potassium chloride SA  20 mEq Oral Daily  . predniSONE  60 mg Oral Q breakfast  . sodium chloride  3 mL Intravenous Q12H  . sodium chloride  3 mL Intravenous Q12H  . tiotropium  18 mcg Inhalation Daily   . heparin 800 Units/hr (08/02/13 0814)  . nitroGLYCERIN      LABS: Basic Metabolic Panel:  Recent Labs  08/01/13 1155 08/02/13 0717  NA 140 138  K 3.6* 4.1  CL 100 102  CO2 25 23  GLUCOSE 92 105*  BUN 13 15  CREATININE 0.84 0.99  CALCIUM 8.6 8.3*   Liver Function Tests: No results found for this basename: AST, ALT, ALKPHOS, BILITOT, PROT, ALBUMIN,  in the last 72 hours No results found for this basename: LIPASE, AMYLASE,  in the last 72 hours CBC:  Recent Labs  08/01/13 1155  WBC 13.2*  HGB 15.3  HCT 44.6  MCV 89.4  PLT 248   Cardiac Enzymes:  Recent Labs  08/01/13 1830 08/01/13 2250 08/02/13 0717  TROPONINI <0.30 <0.30 <0.30   BNP:  Recent Labs  08/01/13 1155  PROBNP 60.5   D-Dimer:  Recent Labs  08/01/13 1239  DDIMER <0.27   Hemoglobin A1C: No results found  for this basename: HGBA1C,  in the last 72 hours Fasting Lipid Panel:  Recent Labs  08/02/13 0717  CHOL 172  HDL 52  LDLCALC 94  TRIG 129  CHOLHDL 3.3   Thyroid Function Tests: No results found for this basename: TSH, T4TOTAL, FREET3, T3FREE, THYROIDAB,  in the last 72 hours   Radiology/Studies:  Dg Chest 2 View  08/01/2013   CLINICAL DATA:  Shortness of breath.  EXAM: CHEST  2 VIEW  COMPARISON:  10/01/2012.  FINDINGS: The cardiac silhouette, mediastinal and hilar contours are within normal limits and stable. The lungs demonstrate mild chronic emphysematous changes and pulmonary scarring. No acute infiltrate, mass or pleural effusion.  IMPRESSION: Chronic lung changes but no acute overlying pulmonary process.   Electronically Signed   By: Kalman Jewels M.D.   On: 08/01/2013 13:25    PHYSICAL EXAM General: Well developed, well nourished, in no acute distress. Head: Normocephalic, atraumatic, sclera non-icteric, oropharynx is clear Neck: Negative for carotid bruits. JVD not elevated. No adenopathy Lungs: Decreased BS without  wheezes, rales, or rhonchi. Breathing is unlabored. Heart: RRR S1 S2 without murmurs, rubs, or gallops.  Abdomen: Soft, non-tender, non-distended with normoactive bowel sounds. No hepatomegaly. No rebound/guarding. No obvious abdominal masses. Msk:  Strength and tone appears normal for age. Extremities: No clubbing, cyanosis or edema.  Distal pedal pulses are 2+ and equal bilaterally. Neuro: Alert and oriented X 3. Moves all extremities spontaneously. Psych:  Responds to questions appropriately with a normal affect.  ASSESSMENT AND PLAN: 1. Unstable angina. He has ruled out for MI. Ecg pending this am. Suspect current burning pain is GERD. Will give Protonix. Continue IV heparin, norvasc, ASA. Plan cardiac cath tomorrow.  2. COPD exacerbation with bronchospasm. Clinically better with steroids and nebulizers.   3.CAD s/p BMS of RCA x 2.   4. Hyperlipidemia.  On lipitor and Zetia.  5. HTN  6. Tobacco abuse- currently stopped.  Present on Admission:  . Unstable angina  Signed, Avelino Herren [name], [place] 08/02/2013 9:00 AM

## 2013-08-03 ENCOUNTER — Encounter (HOSPITAL_COMMUNITY)
Admission: EM | Disposition: A | Discharge status: Home / Self Care | Payer: Self-pay | Source: Home / Self Care | Attending: Cardiology

## 2013-08-03 ENCOUNTER — Encounter (HOSPITAL_COMMUNITY): Payer: Self-pay | Admitting: General Practice

## 2013-08-03 DIAGNOSIS — E785 Hyperlipidemia, unspecified: Secondary | ICD-10-CM

## 2013-08-03 DIAGNOSIS — J449 Chronic obstructive pulmonary disease, unspecified: Secondary | ICD-10-CM

## 2013-08-03 DIAGNOSIS — I2510 Atherosclerotic heart disease of native coronary artery without angina pectoris: Secondary | ICD-10-CM

## 2013-08-03 HISTORY — PX: LEFT HEART CATHETERIZATION WITH CORONARY ANGIOGRAM: SHX5451

## 2013-08-03 LAB — CBC
HEMATOCRIT: 44.4 % (ref 39.0–52.0)
Hemoglobin: 14.6 g/dL (ref 13.0–17.0)
MCH: 30.1 pg (ref 26.0–34.0)
MCHC: 32.9 g/dL (ref 30.0–36.0)
MCV: 91.5 fL (ref 78.0–100.0)
Platelets: 232 10*3/uL (ref 150–400)
RBC: 4.85 MIL/uL (ref 4.22–5.81)
RDW: 14 % (ref 11.5–15.5)
WBC: 15.9 10*3/uL — ABNORMAL HIGH (ref 4.0–10.5)

## 2013-08-03 LAB — HEPARIN LEVEL (UNFRACTIONATED): HEPARIN UNFRACTIONATED: 0.35 [IU]/mL (ref 0.30–0.70)

## 2013-08-03 SURGERY — LEFT HEART CATHETERIZATION WITH CORONARY ANGIOGRAM
Anesthesia: LOCAL

## 2013-08-03 MED ORDER — NITROGLYCERIN 1 MG/10 ML FOR IR/CATH LAB
INTRA_ARTERIAL | Status: AC
  Filled 2013-08-03: qty 10

## 2013-08-03 MED ORDER — SODIUM CHLORIDE 0.9 % IV SOLN
INTRAVENOUS | Status: DC
  Administered 2013-08-03: 55 mL via INTRAVENOUS

## 2013-08-03 MED ORDER — LIDOCAINE HCL (PF) 1 % IJ SOLN
INTRAMUSCULAR | Status: AC
  Filled 2013-08-03: qty 30

## 2013-08-03 MED ORDER — HEPARIN (PORCINE) IN NACL 2-0.9 UNIT/ML-% IJ SOLN
INTRAMUSCULAR | Status: AC
  Filled 2013-08-03: qty 500

## 2013-08-03 MED ORDER — HEPARIN SODIUM (PORCINE) 1000 UNIT/ML IJ SOLN
INTRAMUSCULAR | Status: AC
Start: 2013-08-03 — End: 2013-08-03
  Filled 2013-08-03: qty 1

## 2013-08-03 MED ORDER — SODIUM CHLORIDE 0.9 % IV SOLN: 1.0000 mL/kg/h | INTRAVENOUS | Status: AC

## 2013-08-03 MED ORDER — HEPARIN (PORCINE) IN NACL 2-0.9 UNIT/ML-% IJ SOLN
INTRAMUSCULAR | Status: AC
Start: 2013-08-03 — End: 2013-08-03
  Filled 2013-08-03: qty 1000

## 2013-08-03 NOTE — CV Procedure (Signed)
    Cardiac Catheterization Procedure Note  Name: [name] MRN: [id number] DOB: May 30, 1945  Procedure: Left Heart Cath, Selective Coronary Angiography, LV angiography  Indication: [name] presents with unstable angina. Prior BMS of the proximal, mid RCA and PLOM in 2014.   Procedural Details: The right wrist was prepped, draped, and anesthetized with 1% lidocaine. Using the modified Seldinger technique, a 6 French slender sheath was introduced into the right radial artery. 3 mg of verapamil was administered through the sheath, weight-based unfractionated heparin was administered intravenously. Standard Judkins catheters were used for selective coronary angiography and left ventriculography. Catheter exchanges were performed over an exchange length guidewire. There were no immediate procedural complications. A TR band was used for radial hemostasis at the completion of the procedure.  The patient was transferred to the post catheterization recovery area for further monitoring.  Procedural Findings: Hemodynamics: AO 123/62 mean 87 mm Hg LV 122/19 mm Hg  Coronary angiography: Coronary dominance: right  Left mainstem: Normal  Left anterior descending (LAD): Mild disease in the proximal and mid LAD less than 20%.  Left circumflex (LCx): Mild disease in the mid vessel to 20%  Right coronary artery (RCA): The stents in the proximal and Mid RCA are widely patent. The stent in the POM has mild diffuse disease up to 30%.  Left ventriculography: Left ventricular systolic function is normal, LVEF is estimated at 55-65%, there is no significant mitral regurgitation   Final Conclusions:   1. Nonobstructive CAD 2. Normal LV function.  Recommendations: Will continue medical therapy. I think his initial presentation was related to COPD exacerbation with bronchospasm. Plan Steroid taper to prior dose over the next 5-6 days.   Peter [name], [place]  08/03/2013, 3:54 PM

## 2013-08-03 NOTE — Progress Notes (Signed)
UR Completed Samra Pesch Graves-Bigelow, RN,BSN [phone]  

## 2013-08-03 NOTE — H&P (View-Only) (Signed)
Patient Name: [name] Date of Encounter: 08/03/2013     Active Problems:   Unstable angina   Asthma exacerbation in COPD    SUBJECTIVE  Refused to sign consent. I called a vietnamese interpreter and we spoke about the cardiac cath. He is willing to proceed. Angry he cannot eat or drink anything. Feeling better after our talk.   CURRENT MEDS . amLODipine  10 mg Oral Daily  . aspirin EC  81 mg Oral Daily  . atorvastatin  80 mg Oral q1800  . budesonide-formoterol  2 puff Inhalation BID  . ezetimibe  10 mg Oral Daily  . pantoprazole  40 mg Oral Daily  . potassium chloride SA  20 mEq Oral Daily  . predniSONE  60 mg Oral Q breakfast  . sodium chloride  3 mL Intravenous Q12H  . sodium chloride  3 mL Intravenous Q12H  . tiotropium  18 mcg Inhalation Daily    OBJECTIVE  Filed Vitals:   08/02/13 1546 08/02/13 1945 08/03/13 0555 08/03/13 0951  BP: 114/55 109/70 126/78   Pulse: 86 90 74 75  Temp: 98.8 F (37.1 C) 98.1 F (36.7 C) 98.2 F (36.8 C)   TempSrc: Oral Oral Oral   Resp: 18 18 18 19   Height:      Weight:      SpO2: 96% 95% 100% 98%   No intake or output data in the 24 hours ending 08/03/13 1139 Filed Weights   08/01/13 1643  Weight: 146 lb (66.225 kg)    PHYSICAL EXAM  General: Pleasant, NAD. Neuro: Alert and oriented X 3. Moves all extremities spontaneously. Psych: Normal affect. HEENT:  Normal  Neck: Supple without bruits or JVD. Lungs:  Resp regular and unlabored, CTA. Heart: RRR no s3, s4, or murmurs. Abdomen: Soft, non-tender, non-distended, BS + x 4.  Extremities: No clubbing, cyanosis or edema. DP/PT/Radials 2+ and equal bilaterally.  Accessory Clinical Findings  CBC  Recent Labs  08/01/13 1155 08/03/13 0825  WBC 13.2* 15.9*  HGB 15.3 14.6  HCT 44.6 44.4  MCV 89.4 91.5  PLT 248 585   Basic Metabolic Panel  Recent Labs  08/01/13 1155 08/02/13 0717  NA 140 138  K 3.6* 4.1  CL 100 102  CO2 25 23  GLUCOSE 92 105*  BUN 13 15    CREATININE 0.84 0.99  CALCIUM 8.6 8.3*    Cardiac Enzymes  Recent Labs  08/01/13 1830 08/01/13 2250 08/02/13 0717  TROPONINI <0.30 <0.30 <0.30    D-Dimer  Recent Labs  08/01/13 1239  DDIMER <0.27   Hemoglobin A1C  Fasting Lipid Panel  Recent Labs  08/02/13 0717  CHOL 172  HDL 52  LDLCALC 94  TRIG 129  CHOLHDL 3.3    TELE  NSR with some PVCs  Radiology/Studies  Dg Chest 2 View  08/01/2013   CLINICAL DATA:  Shortness of breath.  EXAM: CHEST  2 VIEW  COMPARISON:  10/01/2012.  FINDINGS: The cardiac silhouette, mediastinal and hilar contours are within normal limits and stable. The lungs demonstrate mild chronic emphysematous changes and pulmonary scarring. No acute infiltrate, mass or pleural effusion.  IMPRESSION: Chronic lung changes but no acute overlying pulmonary process.      ASSESSMENT AND PLAN  [name] is a 68 y.o. male with a history of HLD, HTN, tobacco abuse- quit in 2010, COPD (on chronic steroid therapy), GERD and CAD s/p BMS of RCA x 2 in 02/2012 and BMS x2 to [place] and PLOM in 06/2012  who presented to [facility] on 08/01/13 with chest pain concerning for [place].  Unstable angina- in the setting of CAD with multiple stents placed last year. -- He has ruled out for MI.  -- Chest burning suspected to be related to GERD -continue on Protonix.  -- Continue IV heparin, norvasc, ASA. Plan for cardiac cath today.   COPD exacerbation with bronchospasm. Clinically better with steroids and nebulizers.   Hyperlipidemia. Well controlled on lipitor and Zetia.   HTN - BP controlled   Tobacco abuse- currently stopped.  Leukocytosis- WBC 15.9. CXR with no acute pulmonary process. Afebrile currently.  -- Continue to monitor  Hypokalemia- resolved. On K supplementation.   Tyrell Antonio PA-C  Pager [phone]  I have examined the patient and reviewed assessment and plan and discussed with patient.  Agree with above as stated.  Planning for cath with an  interpreter present.  Elener Custodio S.

## 2013-08-03 NOTE — Interval H&P Note (Signed)
History and Physical Interval Note:  08/03/2013 3:19 PM  [name]  has presented today for surgery, with the diagnosis of chest pain  The various methods of treatment have been discussed with the patient and family. After consideration of risks, benefits and other options for treatment, the patient has consented to  Procedure(s): LEFT HEART CATHETERIZATION WITH CORONARY ANGIOGRAM (N/A) as a surgical intervention .  The patient's history has been reviewed, patient examined, no change in status, stable for surgery.  I have reviewed the patient's chart and labs.  Questions were answered to the patient's satisfaction.   Cath Lab Visit (complete for each Cath Lab visit)  Clinical Evaluation Leading to the Procedure:   ACS: Yes.    Non-ACS:    Anginal Classification: CCS IV  Anti-ischemic medical therapy: Maximal Therapy (2 or more classes of medications)  Non-Invasive Test Results: No non-invasive testing performed  Prior CABG: No previous CABG        [name] Cvp [facility] 08/03/2013 3:19 PM

## 2013-08-03 NOTE — Progress Notes (Signed)
Translation line called spoke with translator  # [phone] , to interpret for the consent for cardiac cath, pt has stated that he wishes to discuss the procedure further with doctor before signing consent. Pt has questions related to not having procedure done.

## 2013-08-03 NOTE — Progress Notes (Addendum)
Patient Name: [name] Date of Encounter: 08/03/2013     Active Problems:   Unstable angina   Asthma exacerbation in COPD    SUBJECTIVE  Refused to sign consent. I called a vietnamese interpreter and we spoke about the cardiac cath. He is willing to proceed. Angry he cannot eat or drink anything. Feeling better after our talk.   CURRENT MEDS . amLODipine  10 mg Oral Daily  . aspirin EC  81 mg Oral Daily  . atorvastatin  80 mg Oral q1800  . budesonide-formoterol  2 puff Inhalation BID  . ezetimibe  10 mg Oral Daily  . pantoprazole  40 mg Oral Daily  . potassium chloride SA  20 mEq Oral Daily  . predniSONE  60 mg Oral Q breakfast  . sodium chloride  3 mL Intravenous Q12H  . sodium chloride  3 mL Intravenous Q12H  . tiotropium  18 mcg Inhalation Daily    OBJECTIVE  Filed Vitals:   08/02/13 1546 08/02/13 1945 08/03/13 0555 08/03/13 0951  BP: 114/55 109/70 126/78   Pulse: 86 90 74 75  Temp: 98.8 F (37.1 C) 98.1 F (36.7 C) 98.2 F (36.8 C)   TempSrc: Oral Oral Oral   Resp: 18 18 18 19   Height:      Weight:      SpO2: 96% 95% 100% 98%   No intake or output data in the 24 hours ending 08/03/13 1139 Filed Weights   08/01/13 1643  Weight: 146 lb (66.225 kg)    PHYSICAL EXAM  General: Pleasant, NAD. Neuro: Alert and oriented X 3. Moves all extremities spontaneously. Psych: Normal affect. HEENT:  Normal  Neck: Supple without bruits or JVD. Lungs:  Resp regular and unlabored, CTA. Heart: RRR no s3, s4, or murmurs. Abdomen: Soft, non-tender, non-distended, BS + x 4.  Extremities: No clubbing, cyanosis or edema. DP/PT/Radials 2+ and equal bilaterally.  Accessory Clinical Findings  CBC  Recent Labs  08/01/13 1155 08/03/13 0825  WBC 13.2* 15.9*  HGB 15.3 14.6  HCT 44.6 44.4  MCV 89.4 91.5  PLT 248 528   Basic Metabolic Panel  Recent Labs  08/01/13 1155 08/02/13 0717  NA 140 138  K 3.6* 4.1  CL 100 102  CO2 25 23  GLUCOSE 92 105*  BUN 13 15    CREATININE 0.84 0.99  CALCIUM 8.6 8.3*    Cardiac Enzymes  Recent Labs  08/01/13 1830 08/01/13 2250 08/02/13 0717  TROPONINI <0.30 <0.30 <0.30    D-Dimer  Recent Labs  08/01/13 1239  DDIMER <0.27   Hemoglobin A1C  Fasting Lipid Panel  Recent Labs  08/02/13 0717  CHOL 172  HDL 52  LDLCALC 94  TRIG 129  CHOLHDL 3.3    TELE  NSR with some PVCs  Radiology/Studies  Dg Chest 2 View  08/01/2013   CLINICAL DATA:  Shortness of breath.  EXAM: CHEST  2 VIEW  COMPARISON:  10/01/2012.  FINDINGS: The cardiac silhouette, mediastinal and hilar contours are within normal limits and stable. The lungs demonstrate mild chronic emphysematous changes and pulmonary scarring. No acute infiltrate, mass or pleural effusion.  IMPRESSION: Chronic lung changes but no acute overlying pulmonary process.      ASSESSMENT AND PLAN  Anthonee Gelin is a 68 y.o. male with a history of HLD, HTN, tobacco abuse- quit in 2010, COPD (on chronic steroid therapy), GERD and CAD s/p BMS of RCA x 2 in 02/2012 and BMS x2 to [place] and PLOM in 06/2012  who presented to [facility] on 08/01/13 with chest pain concerning for [place].  Unstable angina- in the setting of CAD with multiple stents placed last year. -- He has ruled out for MI.  -- Chest burning suspected to be related to GERD -continue on Protonix.  -- Continue IV heparin, norvasc, ASA. Plan for cardiac cath today.   COPD exacerbation with bronchospasm. Clinically better with steroids and nebulizers.   Hyperlipidemia. Well controlled on lipitor and Zetia.   HTN - BP controlled   Tobacco abuse- currently stopped.  Leukocytosis- WBC 15.9. CXR with no acute pulmonary process. Afebrile currently.  -- Continue to monitor  Hypokalemia- resolved. On K supplementation.   Tyrell Antonio PA-C  Pager [phone]  I have examined the patient and reviewed assessment and plan and discussed with patient.  Agree with above as stated.  Planning for cath with an  interpreter present.  VARANASI,JAYADEEP S.

## 2013-08-03 NOTE — Progress Notes (Addendum)
ANTICOAGULATION CONSULT NOTE - Follow Up Consult  Pharmacy Consult for Heparin Indication: chest pain/ACS  No Known Allergies  Patient Measurements: Height: 5' 4.17" (163 cm) Weight: 146 lb (66.225 kg) IBW/kg (Calculated) : 59.6  Vital Signs: Temp: 98.2 F (36.8 C) (07/27 0555) Temp src: Oral (07/27 0555) BP: 126/78 mmHg (07/27 0555) Pulse Rate: 75 (07/27 0951)  Labs:  Recent Labs  08/01/13 1155 08/01/13 1830 08/01/13 2250  08/02/13 0717 08/02/13 1640 08/03/13 0415 08/03/13 0825  HGB 15.3  --   --   --   --   --   --  14.6  HCT 44.6  --   --   --   --   --   --  44.4  PLT 248  --   --   --   --   --   --  232  LABPROT  --  12.2  --   --   --   --   --   --   INR  --  0.90  --   --   --   --   --   --   HEPARINUNFRC  --   --   --   < > 0.72* 0.44 0.35  --   CREATININE 0.84  --   --   --  0.99  --   --   --   TROPONINI  --  <0.30 <0.30  --  <0.30  --   --   --   < > = values in this interval not displayed.  Estimated Creatinine Clearance: 60.2 ml/min (by C-G formula based on Cr of 0.99).   Medications:  Heparin 800 units/hr  Assessment: 67yom on heparin for CP/ACS. Heparin level (0.35) is therapeutic but is trending towards lower end of goal - will increase slightly to keep therapeutic and follow-up post cath orders. - H/H and Plts wnl - No significant bleeding reported  Goal of Therapy:  Heparin level 0.3-0.7 units/ml Monitor platelets by anticoagulation protocol: Yes   Plan:  1. Increase heparin drip to 850 units/hr (8.5 ml/hr) 2. Follow-up daily heparin level/CBC or post cath orders  Earleen Newport [phone] 08/03/2013,12:09 PM

## 2013-08-04 ENCOUNTER — Encounter (HOSPITAL_COMMUNITY): Payer: Self-pay | Admitting: Cardiology

## 2013-08-04 DIAGNOSIS — R079 Chest pain, unspecified: Secondary | ICD-10-CM

## 2013-08-04 MED ORDER — PREDNISONE 10 MG PO TABS: 10.0000 mg | ORAL_TABLET | Freq: Every day | ORAL | Status: DC

## 2013-08-04 MED ORDER — OMEPRAZOLE 20 MG PO CPDR: 20.0000 mg | DELAYED_RELEASE_CAPSULE | Freq: Two times a day (BID) | ORAL | Status: DC

## 2013-08-04 MED ORDER — ACETAMINOPHEN 325 MG PO TABS: 650.0000 mg | ORAL_TABLET | ORAL | Status: DC | PRN

## 2013-08-04 MED ORDER — PREDNISONE 20 MG PO TABS
40.0000 mg | ORAL_TABLET | Freq: Every day | ORAL | Status: DC
  Filled 2013-08-04: qty 2

## 2013-08-04 MED ORDER — ATORVASTATIN CALCIUM 20 MG PO TABS: 80.0000 mg | ORAL_TABLET | Freq: Every day | ORAL | Status: DC

## 2013-08-04 NOTE — Discharge Instructions (Signed)
Call [facility] at [phone] if any bleeding, swelling or drainage at cath site.  May shower, no tub baths for 48 hours for groin sticks.  No lifting over 5 pounds for 3 days No driving for 3 days.  Heart Healthy diet.  Call if further problems    B?nh ph?i t?c ngh?n m?n tnh (Chronic Obstructive Pulmonary Disease) B?nh ph?i t?c ngh?n m?n tnh (COPD) l tnh tr?ng ph? bi?n ? ph?i, trong ?, dng kh t? ph?i ra b? h?n ch?. COPD l m?t thu?t ng? chung c th? s? d?ng ?? m t? nh?ng v?n ?? khc nhau ? ph?i lm h?n ch? dng kh, k? c? vim ph? qu?n kinh nin v b?nh kh ph? th?ng. N?u qu v? b? COPD, ch?c n?ng ph?i c?a qu v? c th? s? khng bao gi? tr? l?i bnh th??ng, nh?ng c nh?ng bi?n php qu v? c th? th?c hi?n ?? c?i thi?n ch?c n?ng ph?i v lm qu v? c?m th?y kh?e h?n.  NGUYN NHN   Ht thu?c (ph? bi?n).  Ti?p xc v?i khi thu?c l th? ??ng.  Nh?ng v?n ?? v? di truy?n.  B?nh vim ph?i m?n tnh ho?c nhi?m trng l?p ?i l?p l?i. TRI?U CH?NG   Kh th?, ??c bi?t khi tham gia vo ho?t ??ng th? ch?t.  Ho su, ko di (m?n tnh) v?i m?t l??ng l?n d?ch nh?y ??c.  Th? kh kh.  Th? nhanh (nh?p th? nhanh).  Da ??i mu xm ho?c h?i xanh (xanh tm), ??c bi?t ? cc ngn tay, ngn chn ho?c mi.  M?t m?i.  S?t cn.  Nhi?m trng th??ng xuyn ho?c cc giai ?o?n m cc tri?u ch?ng v? th? tr? nn t?i t? h?n (b?nh tr?m tr?ng h?n).  T?c ng?c. CH?N ?ON  Chuyn gia ch?m [place] s?c kh?e c?a qu v? s? khai thc b?nh s? v ti?n hnh khm th?c th? ?? c ch?n ?on ban ??u. Cc ki?m tra b? sung v? COPD c th? bao g?m:   Ki?m tra ch?c n?ng ph?i (ph?i).  Ch?p X quang ng?c.  Ch?p CT.  Xt nghi?m mu. ?I?U TR?  Cc bi?n php ?i?u tr? s?n c gip qu v? c?m th?y kh?e h?n khi b? COPD bao g?m:   Thu?c dng qua ?ng x?t ho?c my kh dung. Nh?ng lo?i thu?c ny gip x? l cc tri?u ch?ng c?a COPD v lm qu v? th? d? dng h?n.  B? sung thm  xy. B? sung thm  xy ch? c tc  d?ng n?u qu v? c n?ng ?? -xy trong mu th?p.  T?p th? d?c v ho?t ??ng th? ch?t. Nh?ng bi?n php ny h?u ch cho t?t c? nh?ng ng??i b? COPD. M?t s? ng??i c th? h??ng l?i t? ch??ng trnh ph?c h?i ch?c n?ng h h?p. H??NG D?N CH?M [place] T?I NH   S? d?ng t?t c? cc lo?i thu?c (d?ng ht ho?c d?ng vin) theo ch? d?n c?a chuyn gia ch?m [place] s?c kh?e.  Hessie Diener s? d?ng thu?c ho?c xi-r ho khng c?n k ??n lm kh ???ng h h?p c?a qu v? (ch?ng h?n thu?c khng histamin) v lm ch?m qu trnh lo?i b? cc ch?t ti?t, tr? khi chuyn gia ch?m [place] s?c kh?e h??ng d?n khc.  N?u qu v? l ng??i ht thu?c, ?i?u quan tr?ng nh?t m qu v? c th? lm l b? ht thu?c. Ti?p t?c ht thu?c s? lm t?n th??ng ph?i thm v gy ra cc v?n ?? v? h h?p. ?? ngh? chuyn gia  ch?m [place] s?c kh?e gip ?? ?? b? ht thu?c. Chuyn gia ch?m [place] s?c kh?e c th? h??ng d?n qu v? s? d?ng cc ngu?n l?c trong c?ng ??ng ho?c cc b?nh vi?n c h? tr? cai thu?c.  Trnh ti?p xc v?i cc ch?t kch thch, ch?ng h?n khi thu?c, ha ch?t v khi lm tr?m tr?ng thm v?n ?? th? c?a qu v?.  S? d?ng li?u php -xy v h?i ph?c ch?c n?ng h h?p n?u ???c chuyn gia ch?m [place] s?c kh?e ch? d?n. N?u qu v? c?n li?u php -xy t?i nh, hy h?i chuyn gia ch?m [place] s?c kh?e xem li?u qu v? c c?n mua my ?o ?? bo ha -xy trong mu ?? ?o n?ng ?? -xy ? nh hay khng.  Trnh ti?p xc v?i nh?ng ng??i c b?nh truy?n nhi?m.  Hessie Diener thay ??i nhi?t ?? v ?? ?m qu m?c.  ?n th?c ?n c l?i cho s?c kh?e. ?n nhi?u b?a nh? h?n, th??ng xuyn h?n v ngh? ng?i tr??c khi ?n c th? gip qu v? duy tr s?c b?n c?a mnh.  Ti?p t?c ho?t ??ng tch c?c, nh?ng hy cn b?ng ho?t ??ng v?i th?i gian ngh? ng?i. T?p th? d?c v ho?t ??ng th? ch?t s? gip qu v? duy tr kh? n?ng lm nh?ng ?i?u qu v? mu?n lm.  Trnh nhi?m trng v nh?p vi?n l vi?c r?t quan tr?ng khi qu v? b? COPD. B?o ??m vi?c s? d?ng t?t c? cc lo?i v?c-xin m chuyn gia ch?m [place] s?c kh?e khuy?n ngh?, ??c bi?t l  v?c-xin ph? c?u khu?n v v?c xin cm. Hy h?i chuyn gia ch?m [place] s?c kh?e xem qu v? c c?n dng v?c-xin vim ph?i hay khng.  Tm hi?u v s? d?ng cc k? thu?t th? gin ?? x? l c?ng th?ng.  Tm hi?u v s? d?ng cc k? thu?t th? c ki?m sot theo ch? d?n c?a chuyn gia ch?m [place] s?c kh?e. K? thu?t th? c ki?m sot bao g?m:  Th? mm mi. B?t ??u v?i vi?c th? vo (ht) b?ng m?i trong 1 giy. Sau ?, mm mi nh? th? qu v? s? hut so v th? ra (th?)  Th? b?ng c? honh. B?t ??u b?ng cch ??t m?t tay ln b?ng, ngay trn th?t l?ng c?a qu v?. Ht vo th?t ch?m b?ng m?i. Bn tay trn b?ng c?n di chuy?n ra ngoi. Sau ? mm mi v th? ra th?t ch?m. Qu v? s? c th? c?m nh?n ???c bn tay trn b?ng di chuy?n trong khi th? ra.  Tm hi?u v s? d?ng k? thu?t ho c ki?m sot ?? ??y d?ch nh?y ra kh?i ph?i. Ho c ki?m sot l m?t lo?t cc l?n ho ng?n, t?ng d?n. Cc b??c c?a k? thu?t ho c ki?m sot l: 1. H?i nghing ??u v? pha tr??c. 2. Th? th?t su b?ng cch s? d?ng th? b?ng c? honh. 3. C? g?ng nn th? trong 3 giy. 4. Lun ?? mi?ng h?i h trong khi ho hai l?n. 5. Nh? b?t c? d?ch nh?y no vo kh?n gi?y. 6. Ngh? ng?i v l?p l?i cc b??c ny m?t l?n ho?c hai l?n khi c?n thi?t. ?I KHM N?U:   Qu v? b? ho ra nhi?u d?ch nh?y h?n bnh th??ng.  C s? thay ??i v? mu s?c ho?c ?? ??c c?a d?ch nh?y.  Qu v? th? n?ng nh?c h?n bnh th??ng.  Qu v? th? nhanh h?n bnh th??ng. NGAY L?P T?C ?I KHM N?U:   Qu v? b?  kh th? trong khi ngh? ng?i.  Qu v? b? kh th? khi?n qu v? khng th?:  Ni chuy?n.  Th?c hi?n cc ho?t ??ng th? ch?t bnh th??ng.  Qu v? b? ?au ng?c ko di h?n 5 pht.  Mu da c?a qu v? xanh tm h?n bnh th??ng.  Qu v? ?o ?? bo ha x-xy th?p trong h?n 5 pht b?ng m?t my ?o ?? bo ha -xy trong mu. ??M B?O QU V?:   Hi?u r cc h??ng d?n ny.  S? theo di tnh tr?ng c?a mnh.  S? yu c?u tr? gip ngay l?p t?c n?u qu v? c?m th?y khng kh?e ho?c th?y tr?m tr?ng h?n. Document  Released: 10/04/2004 Document Revised: 05/11/2013 [facility] Patient Information 2015 [place]. This information is not intended to replace advice given to you by your health care provider. Make sure you discuss any questions you have with your health care provider.

## 2013-08-04 NOTE — Progress Notes (Addendum)
SUBJECTIVE:  Still has some SHOB.  No chest pain.  Feels better with prednisone.  OBJECTIVE:   Vitals:   Filed Vitals:   08/03/13 1930 08/03/13 2026 08/03/13 2102 08/04/13 0639  BP: 96/46  122/61 130/72  Pulse: 72  89 79  Temp:   97.9 F (36.6 C) 98.3 F (36.8 C)  TempSrc:   Oral Oral  Resp:   20 20  Height:      Weight:    145 lb 3.2 oz (65.862 kg)  SpO2: 98% 98% 92% 96%   I&O's:   Intake/Output Summary (Last 24 hours) at 08/04/13 1236 Last data filed at 08/03/13 2102  Gross per 24 hour  Intake 787.32 ml  Output    575 ml  Net 212.32 ml   TELEMETRY: Reviewed telemetry pt in NSR:     PHYSICAL EXAM General: Well developed, well nourished, in no acute distress Head:   Normal cephalic and atramatic  Lungs:  Mild wheezing bilaterally to auscultation. Heart:  HRRR S1 S2  No JVD.   Abdomen: abdomen soft and non-tender Msk:  Back normal,  Normal strength and tone for age. Extremities:  No edema.  2+ right radial pulse Neuro: Alert and oriented. Psych:  Normal affect, responds appropriately   LABS: Basic Metabolic Panel:  Recent Labs  08/02/13 0717  NA 138  K 4.1  CL 102  CO2 23  GLUCOSE 105*  BUN 15  CREATININE 0.99  CALCIUM 8.3*   Liver Function Tests: No results found for this basename: AST, ALT, ALKPHOS, BILITOT, PROT, ALBUMIN,  in the last 72 hours No results found for this basename: LIPASE, AMYLASE,  in the last 72 hours CBC:  Recent Labs  08/03/13 0825  WBC 15.9*  HGB 14.6  HCT 44.4  MCV 91.5  PLT 232   Cardiac Enzymes:  Recent Labs  08/01/13 1830 08/01/13 2250 08/02/13 0717  TROPONINI <0.30 <0.30 <0.30   BNP: No components found with this basename: POCBNP,  D-Dimer:  Recent Labs  08/01/13 1239  DDIMER <0.27   Hemoglobin A1C: No results found for this basename: HGBA1C,  in the last 72 hours Fasting Lipid Panel:  Recent Labs  08/02/13 0717  CHOL 172  HDL 52  LDLCALC 94  TRIG 129  CHOLHDL 3.3   Thyroid Function  Tests: No results found for this basename: TSH, T4TOTAL, FREET3, T3FREE, THYROIDAB,  in the last 72 hours Anemia Panel: No results found for this basename: VITAMINB12, FOLATE, FERRITIN, TIBC, IRON, RETICCTPCT,  in the last 72 hours Coag Panel:   Lab Results  Component Value Date   INR 0.90 08/01/2013   INR 1.0 06/30/2012   INR 0.93 02/15/2012    RADIOLOGY: Dg Chest 2 View  08/01/2013   CLINICAL DATA:  Shortness of breath.  EXAM: CHEST  2 VIEW  COMPARISON:  10/01/2012.  FINDINGS: The cardiac silhouette, mediastinal and hilar contours are within normal limits and stable. The lungs demonstrate mild chronic emphysematous changes and pulmonary scarring. No acute infiltrate, mass or pleural effusion.  IMPRESSION: Chronic lung changes but no acute overlying pulmonary process.   Electronically Signed   By: Kalman Jewels M.D.   On: 08/01/2013 13:25      ASSESSMENT: Kathyrn Lass:    CAD: medical therapy planned.  SHOB- thought to be a COPD exacerbation.  Plan Prednisone taper over about a week. 50 mg for two days, 40 mg for two days, 30 mg for two days, 20 mg for two days, then 10 mg  daily.  Plan d/c later today .  Jettie Booze., MD  08/04/2013  12:36 PM

## 2013-08-04 NOTE — Discharge Summary (Signed)
Physician Discharge Summary       Patient ID: [name] MRN: [id number] DOB/AGE: 03/13/1945 68 y.o.  Admit date: 08/01/2013 Discharge date: 08/04/2013  Discharge Diagnoses:  Principal Problem:   Asthma exacerbation in COPD Active Problems:   CAD, hx of stents to RCA and patent to nonobstructive on cath 03/06/13   GERD   Hyperlipidemia   Essential hypertension   Tobacco abuse, has stopped   Chest pain, negative MI, secondary due to COPD exacerbation    Discharged Condition: good  Primary Cardiologist:  Dr. [name]  Procedures: 08/03/13 cardiac cath by Dr. [name]  Hospital Course: 68 year old male followed by Dr. [name] had been experiencing progressive chest pain over the past 2 weeks. This is described as a burning in the epigastric region radiating into his chest. He also has pressure and SOB. Symptoms relieved with sl Ntg. He has COPD and does note more wheezing. Chest pain symptoms are similar to prior MI but not as severe. He is not smoking. Sometimes he skips his medication because it makes him jittery. Plavix was stopped in June since he was a year out from his stent. 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 EF was normal. In June of 2014 he had a cath following abnormal Myoview with class III angina and inferior ischemia. He had 2 bare metal Veriflex to a 90% proximal RCA and 80% PLOM branch respectively.Other issues include HLD, HTN, tobacco abuse- quit in 2010, COPD (on chronic steroid therapy) and GERD. Beta blockers including bisoprolol have made his breathing worse.   He was admitted and placed on IV Heparin.  Not a candidate for BB due to COPD with wheezing. He also had COPD exacerbation with bronchospasm and treated with albuterol nebs and steroids.   Cardiac enzymes were negative.  Protonix added as well for burning pain- GI. Pt was scheduled for cardiac cath which he underwent 08/03/13.   CARDIAC Cath: Procedural Findings:  Hemodynamics:  AO 123/62 mean  87 mm Hg  LV 122/19 mm Hg  Coronary angiography:  Coronary dominance: right  Left mainstem: Normal  Left anterior descending (LAD): Mild disease in the proximal and mid LAD less than 20%.  Left circumflex (LCx): Mild disease in the mid vessel to 20%  Right coronary artery (RCA): The stents in the proximal and Mid RCA are widely patent. The stent in the POM has mild diffuse disease up to 30%.  Left ventriculography: Left ventricular systolic function is normal, LVEF is estimated at 55-65%, there is no significant mitral regurgitation  Final Conclusions:  1. Nonobstructive CAD  2. Normal LV function.  Recommendations: Will continue medical therapy. I think his initial presentation was related to COPD exacerbation with bronchospasm. Plan Steroid taper to prior dose over the next 5-6 days.   By the AM of 08/04/13 pt was improved, much better with steroids.  Plan for steroid taper.  Pt is chronically on 10 mg prednisone.  White count elevated due to increase of steroids. Pt was seen and evaluated by Dr. Irish Lack and found stable for discharge home. We also increased his Prilosec to 2 daily.  Consults: None  Significant Diagnostic Studies:  BMET    Component Value Date/Time   NA 138 08/02/2013 0717   K 4.1 08/02/2013 0717   CL 102 08/02/2013 0717   CO2 23 08/02/2013 0717   GLUCOSE 105* 08/02/2013 0717   BUN 15 08/02/2013 0717   CREATININE 0.99 08/02/2013 0717   CALCIUM 8.3* 08/02/2013 4098  GFRNONAA 82* 08/02/2013 0717   GFRAA >90 08/02/2013 0717    CBC    Component Value Date/Time   WBC 15.9* 08/03/2013 0825   RBC 4.85 08/03/2013 0825   HGB 14.6 08/03/2013 0825   HCT 44.4 08/03/2013 0825   PLT 232 08/03/2013 0825   MCV 91.5 08/03/2013 0825   MCH 30.1 08/03/2013 0825   MCHC 32.9 08/03/2013 0825   RDW 14.0 08/03/2013 0825   LYMPHSABS 1.4 03/26/2013 0950   MONOABS 0.6 03/26/2013 0950   EOSABS 0.0 03/26/2013 0950   BASOSABS 0.0 03/26/2013 0950   Troponin <0.30 X 3 Pro BNP 60.5  D-Dimer  <0.27   CHEST 2 VIEW  COMPARISON: 10/01/2012.  FINDINGS:  The cardiac silhouette, mediastinal and hilar contours are within  normal limits and stable. The lungs demonstrate mild chronic  emphysematous changes and pulmonary scarring. No acute infiltrate,  mass or pleural effusion.  IMPRESSION:  Chronic lung changes but no acute overlying pulmonary process.   Discharge Exam: Blood pressure 125/94, pulse 90, temperature 97.7 F (36.5 C), temperature source Oral, resp. rate 20, height 5' 4.17" (1.63 m), weight 145 lb 3.2 oz (65.862 kg), SpO2 94.00%.   Disposition: 01-Home or Self Care     Medication List         acetaminophen 325 MG tablet  Commonly known as:  TYLENOL  Take 2 tablets (650 mg total) by mouth every 4 (four) hours as needed for headache or mild pain.     albuterol 108 (90 BASE) MCG/ACT inhaler  Commonly known as:  PROVENTIL HFA;VENTOLIN HFA  Inhale 2 puffs into the lungs every 6 (six) hours as needed for wheezing or shortness of breath.     albuterol (2.5 MG/3ML) 0.083% nebulizer solution  Commonly known as:  PROVENTIL  Take 3 mLs (2.5 mg total) by nebulization 2 (two) times daily. And as needed. Dx 496,  FILE MCR PART B     amLODipine 5 MG tablet  Commonly known as:  NORVASC  Take 10 mg by mouth daily.     aspirin 81 MG tablet  Take 81 mg by mouth daily as needed.     atorvastatin 20 MG tablet  Commonly known as:  LIPITOR  Take 4 tablets (80 mg total) by mouth daily at 6 PM.     budesonide-formoterol 160-4.5 MCG/ACT inhaler  Commonly known as:  SYMBICORT  Inhale 2 puffs into the lungs 2 (two) times daily.     ezetimibe 10 MG tablet  Commonly known as:  ZETIA  Take 1 tablet (10 mg total) by mouth daily.     nitroGLYCERIN 0.4 MG SL tablet  Commonly known as:  NITROSTAT  Place 1 tablet (0.4 mg total) under the tongue every 5 (five) minutes as needed for chest pain.     omeprazole 20 MG capsule  Commonly known as:  PRILOSEC  Take 1 capsule (20 mg  total) by mouth 2 (two) times daily before a meal.     potassium chloride SA 20 MEQ tablet  Commonly known as:  K-DUR,KLOR-CON  Take 20 mEq by mouth daily.     predniSONE 10 MG tablet  Commonly known as:  DELTASONE  - Take 1 tablet (10 mg total) by mouth daily with breakfast. Take 4 tabs every morning for 2 days 08/05/13 and 08/06/13  - Take 3 tabs every morning for 2 days 08/07/13 and 08/08/13  - Then 2 tabs every morning for 2 days 08/09/13 and 08/10/13  - Then resume 10 mg  daily     SPIRIVA HANDIHALER 18 MCG inhalation capsule  Generic drug:  tiotropium  Place 18 mcg into inhaler and inhale daily.       Follow-up Information   Follow up with Peter [name], MD. (our office will call you with date and time, if you have not heard in 1-2 days call the office.)    Specialty:  Cardiology   Contact information:   [address] [place] [address] [phone]        Discharge Instructions: Call [facility] at [phone] if any bleeding, swelling or drainage at cath site.  May shower, no tub baths for 48 hours for groin sticks.  No lifting over 5 pounds for 3 days No driving for 3 days.  Heart Healthy diet.  Call if further problems    Signed: [facility]: [facility] 08/04/2013, 2:15 PM  Time spent on discharge  >30 minutes.    I have examined the patient and reviewed assessment and plan and discussed with patient.  Agree with above as stated.  Noncardiac cause of SHOB.  Prednisone taper.  Brockton Mckesson S.

## 2013-08-06 ENCOUNTER — Telehealth: Payer: Self-pay | Admitting: Cardiology

## 2013-08-10 ENCOUNTER — Other Ambulatory Visit: Payer: Self-pay | Admitting: Critical Care Medicine

## 2013-08-11 NOTE — Telephone Encounter (Signed)
Closed enounter °

## 2013-08-11 NOTE — Telephone Encounter (Signed)
Received escribe request for prednisone 10 mg tablets. Per pt's chart, prednisone 10 mg rx was printed by Cecilie Kicks, NP on 08/04/13 for #[phone] x 6. Spoke with Judson Roch with [facility].  Was advised pt did bring in the prednisone rx from Cecilie Kicks, NP and picked up medication on Aug 08, 2013. Judson Roch states the escribe prednisone request we received was sent "by accident" and nothing further needed at this time.

## 2013-08-26 ENCOUNTER — Encounter: Payer: Self-pay | Admitting: Cardiology

## 2013-08-26 ENCOUNTER — Encounter (HOSPITAL_COMMUNITY): Payer: Self-pay | Admitting: Emergency Medicine

## 2013-08-26 ENCOUNTER — Emergency Department (HOSPITAL_COMMUNITY): Payer: Medicare HMO

## 2013-08-26 ENCOUNTER — Inpatient Hospital Stay (HOSPITAL_COMMUNITY)
Admission: EM | Admit: 2013-08-26 | Discharge: 2013-08-31 | DRG: 190 | Disposition: A | Discharge status: Home / Self Care | Payer: Medicare HMO | Attending: Internal Medicine | Admitting: Internal Medicine

## 2013-08-26 ENCOUNTER — Ambulatory Visit (INDEPENDENT_AMBULATORY_CARE_PROVIDER_SITE_OTHER): Payer: Commercial Managed Care - HMO | Admitting: Cardiology

## 2013-08-26 VITALS — BP 114/64 | HR 87 | Ht 63.0 in | Wt 149.2 lb

## 2013-08-26 DIAGNOSIS — J441 Chronic obstructive pulmonary disease with (acute) exacerbation: Secondary | ICD-10-CM

## 2013-08-26 DIAGNOSIS — J449 Chronic obstructive pulmonary disease, unspecified: Secondary | ICD-10-CM

## 2013-08-26 DIAGNOSIS — Z7982 Long term (current) use of aspirin: Secondary | ICD-10-CM | POA: Diagnosis not present

## 2013-08-26 DIAGNOSIS — K219 Gastro-esophageal reflux disease without esophagitis: Secondary | ICD-10-CM | POA: Diagnosis present

## 2013-08-26 DIAGNOSIS — E876 Hypokalemia: Secondary | ICD-10-CM | POA: Diagnosis present

## 2013-08-26 DIAGNOSIS — R079 Chest pain, unspecified: Secondary | ICD-10-CM

## 2013-08-26 DIAGNOSIS — T380X5A Adverse effect of glucocorticoids and synthetic analogues, initial encounter: Secondary | ICD-10-CM | POA: Diagnosis present

## 2013-08-26 DIAGNOSIS — E1169 Type 2 diabetes mellitus with other specified complication: Secondary | ICD-10-CM | POA: Diagnosis present

## 2013-08-26 DIAGNOSIS — I10 Essential (primary) hypertension: Secondary | ICD-10-CM

## 2013-08-26 DIAGNOSIS — R000 Tachycardia, unspecified: Secondary | ICD-10-CM | POA: Diagnosis present

## 2013-08-26 DIAGNOSIS — I2510 Atherosclerotic heart disease of native coronary artery without angina pectoris: Secondary | ICD-10-CM

## 2013-08-26 DIAGNOSIS — J9600 Acute respiratory failure, unspecified whether with hypoxia or hypercapnia: Secondary | ICD-10-CM | POA: Diagnosis present

## 2013-08-26 DIAGNOSIS — E118 Type 2 diabetes mellitus with unspecified complications: Secondary | ICD-10-CM

## 2013-08-26 DIAGNOSIS — R071 Chest pain on breathing: Secondary | ICD-10-CM

## 2013-08-26 DIAGNOSIS — Z9861 Coronary angioplasty status: Secondary | ICD-10-CM | POA: Diagnosis not present

## 2013-08-26 DIAGNOSIS — Z87891 Personal history of nicotine dependence: Secondary | ICD-10-CM

## 2013-08-26 DIAGNOSIS — J45901 Unspecified asthma with (acute) exacerbation: Secondary | ICD-10-CM | POA: Diagnosis present

## 2013-08-26 DIAGNOSIS — D72829 Elevated white blood cell count, unspecified: Secondary | ICD-10-CM | POA: Diagnosis present

## 2013-08-26 DIAGNOSIS — J309 Allergic rhinitis, unspecified: Secondary | ICD-10-CM | POA: Diagnosis present

## 2013-08-26 DIAGNOSIS — R0602 Shortness of breath: Secondary | ICD-10-CM | POA: Diagnosis present

## 2013-08-26 DIAGNOSIS — E785 Hyperlipidemia, unspecified: Secondary | ICD-10-CM | POA: Diagnosis present

## 2013-08-26 DIAGNOSIS — R739 Hyperglycemia, unspecified: Secondary | ICD-10-CM

## 2013-08-26 LAB — I-STAT CHEM 8, ED
BUN: 15 mg/dL (ref 6–23)
CREATININE: 0.9 mg/dL (ref 0.50–1.35)
Calcium, Ion: 1.05 mmol/L — ABNORMAL LOW (ref 1.13–1.30)
Chloride: 102 mEq/L (ref 96–112)
GLUCOSE: 129 mg/dL — AB (ref 70–99)
HCT: 49 % (ref 39.0–52.0)
HEMOGLOBIN: 16.7 g/dL (ref 13.0–17.0)
POTASSIUM: 3 meq/L — AB (ref 3.7–5.3)
Sodium: 140 mEq/L (ref 137–147)
TCO2: 27 mmol/L (ref 0–100)

## 2013-08-26 LAB — PRO B NATRIURETIC PEPTIDE: Pro B Natriuretic peptide (BNP): 84.4 pg/mL (ref 0–125)

## 2013-08-26 LAB — I-STAT TROPONIN, ED: Troponin i, poc: 0.01 ng/mL (ref 0.00–0.08)

## 2013-08-26 LAB — BASIC METABOLIC PANEL
ANION GAP: 16 — AB (ref 5–15)
BUN: 15 mg/dL (ref 6–23)
CO2: 24 mEq/L (ref 19–32)
Calcium: 8.7 mg/dL (ref 8.4–10.5)
Chloride: 102 mEq/L (ref 96–112)
Creatinine, Ser: 0.87 mg/dL (ref 0.50–1.35)
GFR calc Af Amer: >90 mL/min (ref >90)
GFR, EST NON AFRICAN AMERICAN: 87 mL/min — AB (ref >90)
GLUCOSE: 123 mg/dL — AB (ref 70–99)
Potassium: 3.3 mEq/L — ABNORMAL LOW (ref 3.7–5.3)
SODIUM: 142 meq/L (ref 137–147)

## 2013-08-26 LAB — I-STAT ARTERIAL BLOOD GAS, ED
ACID-BASE EXCESS: 5 mmol/L — AB (ref 0.0–2.0)
BICARBONATE: 29.2 meq/L — AB (ref 20.0–24.0)
O2 SAT: 100 %
TCO2: 30 mmol/L (ref 0–100)
pCO2 arterial: 41.7 mmHg (ref 35.0–45.0)
pH, Arterial: 7.454 — ABNORMAL HIGH (ref 7.350–7.450)
pO2, Arterial: 161 mmHg — ABNORMAL HIGH (ref 80.0–100.0)

## 2013-08-26 LAB — CBC
HCT: 46 % (ref 39.0–52.0)
Hemoglobin: 15.3 g/dL (ref 13.0–17.0)
MCH: 30.2 pg (ref 26.0–34.0)
MCHC: 33.3 g/dL (ref 30.0–36.0)
MCV: 90.7 fL (ref 78.0–100.0)
PLATELETS: 257 10*3/uL (ref 150–400)
RBC: 5.07 MIL/uL (ref 4.22–5.81)
RDW: 14.3 % (ref 11.5–15.5)
WBC: 11.9 10*3/uL — ABNORMAL HIGH (ref 4.0–10.5)

## 2013-08-26 LAB — MRSA PCR SCREENING: MRSA by PCR: NEGATIVE

## 2013-08-26 LAB — TROPONIN I

## 2013-08-26 LAB — GLUCOSE, CAPILLARY: GLUCOSE-CAPILLARY: 354 mg/dL — AB (ref 70–99)

## 2013-08-26 MED ORDER — ONDANSETRON HCL 4 MG PO TABS: 4.0000 mg | ORAL_TABLET | Freq: Four times a day (QID) | ORAL | Status: DC | PRN

## 2013-08-26 MED ORDER — ACETAMINOPHEN 650 MG RE SUPP: 650.0000 mg | Freq: Four times a day (QID) | RECTAL | Status: DC | PRN

## 2013-08-26 MED ORDER — MORPHINE SULFATE 2 MG/ML IJ SOLN
2.0000 mg | INTRAMUSCULAR | Status: DC | PRN
  Administered 2013-08-27 – 2013-08-28 (×2): 2 mg via INTRAVENOUS
  Filled 2013-08-26 (×2): qty 1

## 2013-08-26 MED ORDER — HEPARIN SODIUM (PORCINE) 5000 UNIT/ML IJ SOLN
5000.0000 [IU] | Freq: Three times a day (TID) | INTRAMUSCULAR | Status: DC
  Administered 2013-08-26 – 2013-08-31 (×15): 5000 [IU] via SUBCUTANEOUS
  Filled 2013-08-26 (×18): qty 1

## 2013-08-26 MED ORDER — LEVOFLOXACIN IN D5W 500 MG/100ML IV SOLN
500.0000 mg | INTRAVENOUS | Status: DC
  Administered 2013-08-27 – 2013-08-28 (×2): 500 mg via INTRAVENOUS
  Filled 2013-08-26 (×3): qty 100

## 2013-08-26 MED ORDER — AMLODIPINE BESYLATE 5 MG PO TABS
5.0000 mg | ORAL_TABLET | Freq: Every day | ORAL | Status: DC
  Administered 2013-08-27 – 2013-08-29 (×3): 5 mg via ORAL
  Filled 2013-08-26 (×3): qty 1

## 2013-08-26 MED ORDER — POTASSIUM CHLORIDE CRYS ER 20 MEQ PO TBCR
20.0000 meq | EXTENDED_RELEASE_TABLET | Freq: Every day | ORAL | Status: DC
  Administered 2013-08-27 – 2013-08-29 (×3): 20 meq via ORAL
  Filled 2013-08-26 (×3): qty 1

## 2013-08-26 MED ORDER — IPRATROPIUM-ALBUTEROL 0.5-2.5 (3) MG/3ML IN SOLN: 3.0000 mL | RESPIRATORY_TRACT | Status: DC | PRN

## 2013-08-26 MED ORDER — ONDANSETRON HCL 4 MG/2ML IJ SOLN: 4.0000 mg | Freq: Four times a day (QID) | INTRAMUSCULAR | Status: DC | PRN

## 2013-08-26 MED ORDER — POTASSIUM CHLORIDE CRYS ER 20 MEQ PO TBCR: 40.0000 meq | EXTENDED_RELEASE_TABLET | Freq: Once | ORAL | Status: DC

## 2013-08-26 MED ORDER — EZETIMIBE 10 MG PO TABS
10.0000 mg | ORAL_TABLET | Freq: Every day | ORAL | Status: DC
  Administered 2013-08-26 – 2013-08-31 (×6): 10 mg via ORAL
  Filled 2013-08-26 (×6): qty 1

## 2013-08-26 MED ORDER — ACETAMINOPHEN 325 MG PO TABS: 650.0000 mg | ORAL_TABLET | Freq: Four times a day (QID) | ORAL | Status: DC | PRN

## 2013-08-26 MED ORDER — PANTOPRAZOLE SODIUM 40 MG PO TBEC
40.0000 mg | DELAYED_RELEASE_TABLET | Freq: Every day | ORAL | Status: DC
  Administered 2013-08-26 – 2013-08-31 (×6): 40 mg via ORAL
  Filled 2013-08-26 (×6): qty 1

## 2013-08-26 MED ORDER — ATORVASTATIN CALCIUM 40 MG PO TABS
40.0000 mg | ORAL_TABLET | Freq: Every morning | ORAL | Status: DC
  Administered 2013-08-27 – 2013-08-31 (×5): 40 mg via ORAL
  Filled 2013-08-26 (×5): qty 1

## 2013-08-26 MED ORDER — ALBUTEROL (5 MG/ML) CONTINUOUS INHALATION SOLN
10.0000 mg/h | INHALATION_SOLUTION | Freq: Once | RESPIRATORY_TRACT | Status: AC
  Administered 2013-08-26: 10 mg/h via RESPIRATORY_TRACT
  Filled 2013-08-26: qty 20

## 2013-08-26 MED ORDER — PREDNISONE 10 MG PO TABS: 10.0000 mg | ORAL_TABLET | Freq: Every day | ORAL | Status: DC

## 2013-08-26 MED ORDER — ASPIRIN 81 MG PO CHEW
81.0000 mg | CHEWABLE_TABLET | Freq: Every day | ORAL | Status: DC
  Administered 2013-08-26 – 2013-08-31 (×6): 81 mg via ORAL
  Filled 2013-08-26 (×6): qty 1

## 2013-08-26 MED ORDER — IPRATROPIUM-ALBUTEROL 0.5-2.5 (3) MG/3ML IN SOLN
3.0000 mL | RESPIRATORY_TRACT | Status: DC
  Administered 2013-08-26: 3 mL via RESPIRATORY_TRACT
  Filled 2013-08-26: qty 3

## 2013-08-26 MED ORDER — NITROGLYCERIN 0.4 MG SL SUBL: 0.4000 mg | SUBLINGUAL_TABLET | SUBLINGUAL | Status: DC | PRN

## 2013-08-26 MED ORDER — ASPIRIN 81 MG PO TABS
81.0000 mg | ORAL_TABLET | Freq: Every day | ORAL | Status: DC
  Filled 2013-08-26: qty 1

## 2013-08-26 MED ORDER — LEVOFLOXACIN IN D5W 500 MG/100ML IV SOLN
500.0000 mg | Freq: Once | INTRAVENOUS | Status: AC
  Administered 2013-08-26: 500 mg via INTRAVENOUS
  Filled 2013-08-26: qty 100

## 2013-08-26 MED ORDER — PNEUMOCOCCAL VAC POLYVALENT 25 MCG/0.5ML IJ INJ
0.5000 mL | INJECTION | INTRAMUSCULAR | Status: AC
  Administered 2013-08-27: 0.5 mL via INTRAMUSCULAR
  Filled 2013-08-26: qty 0.5

## 2013-08-26 MED ORDER — METHYLPREDNISOLONE SODIUM SUCC 125 MG IJ SOLR
60.0000 mg | Freq: Four times a day (QID) | INTRAMUSCULAR | Status: DC
  Filled 2013-08-26 (×2): qty 0.96

## 2013-08-26 MED ORDER — SODIUM CHLORIDE 0.9 % IJ SOLN
3.0000 mL | Freq: Two times a day (BID) | INTRAMUSCULAR | Status: DC
  Administered 2013-08-27 – 2013-08-31 (×8): 3 mL via INTRAVENOUS

## 2013-08-26 MED ORDER — METHYLPREDNISOLONE SODIUM SUCC 125 MG IJ SOLR
60.0000 mg | Freq: Four times a day (QID) | INTRAMUSCULAR | Status: DC
  Administered 2013-08-27 (×3): 60 mg via INTRAVENOUS
  Filled 2013-08-26 (×4): qty 0.96

## 2013-08-26 MED ORDER — IPRATROPIUM-ALBUTEROL 0.5-2.5 (3) MG/3ML IN SOLN
3.0000 mL | Freq: Two times a day (BID) | RESPIRATORY_TRACT | Status: DC
  Administered 2013-08-27: 3 mL via RESPIRATORY_TRACT
  Filled 2013-08-26: qty 3

## 2013-08-26 NOTE — ED Notes (Signed)
Attempted report X1

## 2013-08-26 NOTE — Patient Instructions (Signed)
Your physician recommends that you schedule a follow-up appointment with your PCP in 1 week and 4 Months with [name]  Go to ER if not better with your Asthma

## 2013-08-26 NOTE — Progress Notes (Signed)
**Note De-Identified  Obfuscation** Patient removed from BIPAP and continues to tolerating Albuterol CAT; BBS dim/slight expiratory wheeze.

## 2013-08-26 NOTE — Progress Notes (Signed)
BIPAP in room in case needed throughout night. RT will continue to monitor,

## 2013-08-26 NOTE — Progress Notes (Signed)
ANTIBIOTIC CONSULT NOTE - INITIAL  Pharmacy Consult for Levaquin Indication: acute bronchitis  No Known Allergies  Patient Measurements:    Vital Signs: Temp: 99.3 F (37.4 C) (08/19 1656) Temp src: Oral (08/19 1656) BP: 126/68 mmHg (08/19 1808) Pulse Rate: 96 (08/19 1808) Intake/Output from previous day:   Intake/Output from this shift:    Labs:  Recent Labs  08/26/13 1653 08/26/13 1704  WBC 11.9*  --   HGB 15.3 16.7  PLT 257  --   CREATININE 0.87 0.90   The CrCl is unknown because both a height and weight (above a minimum accepted value) are required for this calculation. No results found for this basename: VANCOTROUGH, VANCOPEAK, VANCORANDOM, GENTTROUGH, GENTPEAK, GENTRANDOM, TOBRATROUGH, TOBRAPEAK, TOBRARND, AMIKACINPEAK, AMIKACINTROU, AMIKACIN,  in the last 72 hours   Microbiology: No results found for this or any previous visit (from the past 720 hour(s)).  Medical History: Past Medical History  Diagnosis Date  . COPD (chronic obstructive pulmonary disease)     former smoker  . GERD (gastroesophageal reflux disease)   . CAD (coronary artery disease), native coronary artery 6/11, 2/14, 6/14; 7/15    a. thought due to vasospasm . cath - CAD LAD 40-50%, LCX 40%, RCA 25% EF 65%. lexiscan myoview EF 68% w/o evidence of ischemia or infarct b. STEMI s/p BMS-mid RCA c. BMS x 2- prox RCA, PLOM  . HTN (hypertension)   . Asthma   . Hyperlipidemia   . Tobacco abuse   . Allergic rhinitis     Medications:  See electronic PTA medication list  Assessment: 68 y/o male with COPD who presents to the ED with increased SOB. Pharmacy consulted to begin Levaquin for acute bronchitis. He is afebrile, WBC are slightly elevated, and renal function is normal. No antibiotics have been given yet.  Goal of Therapy:  Resolution of infection  Plan:  - Levaquin 500 mg IV q24h - Recommend 7 days of therapy - Monitor renal function  [facility], Pharm.D., BCPS Clinical  Pharmacist Pager: [phone] 08/26/2013 6:56 PM

## 2013-08-26 NOTE — ED Provider Notes (Addendum)
CSN: [id number]     Arrival date & time 08/26/13  1638 History   First MD Initiated Contact with Patient 08/26/13 1638     Chief Complaint  Patient presents with  . Respiratory Distress   Patient is a 68 y.o. male presenting with shortness of breath. The history is provided by the patient.  Shortness of Breath Severity:  Severe Onset quality:  Gradual Duration:  2 days Timing:  Constant Progression:  Worsening Chronicity:  Recurrent Relieved by:  Nothing Worsened by:  Activity Ineffective treatments:  Inhaler Associated symptoms: chest pain, diaphoresis and wheezing   Associated symptoms: no fever, no sputum production and no vomiting    the patient has a history of COPD and congestive heart failure. For the last few days he's had issues with worsening shortness of breath. He has tried inhalers as well as nitroglycerin. Today he started having some discomfort in his chest. He describes it as central as well as left and right-sided. Nothing seems to make it worse. The symptoms became very severe today. He is having severe distress with his breathing. He does not feel like he can take it any longer.  EMS gave the patient Solu-Medrol as well as albuterol Atrovent treatments.  Past Medical History  Diagnosis Date  . COPD (chronic obstructive pulmonary disease)     former smoker  . GERD (gastroesophageal reflux disease)   . CAD (coronary artery disease), native coronary artery 6/11, 2/14, 6/14; 7/15    a. thought due to vasospasm . cath - CAD LAD 40-50%, LCX 40%, RCA 25% EF 65%. lexiscan myoview EF 68% w/o evidence of ischemia or infarct b. STEMI s/p BMS-mid RCA c. BMS x 2- prox RCA, PLOM  . HTN (hypertension)   . Asthma   . Hyperlipidemia   . Tobacco abuse   . Allergic rhinitis    Past Surgical History  Procedure Laterality Date  . Left foot surgery      repair - and ankle   . Coronary angioplasty with stent placement  02/15/12    20% distal LM, 30-40% mid LAD, 50% ostial diag,  50-60% mid LCx, 99% focal mid RCA stenosis s/p BMS; 20% distal RCA disease, 50% prox PLB; LVEF 55-65%  . Coronary angioplasty with stent placement  07/01/12    30% prox LAD, 30% mid LAD, 20% mid LCx, 90% prox RCA s/p BMS, patent mid RCA stent, 30-40% distal RCA, 80% PLB s/p BMS; EF 55-65%  . Cardiac catheterization  08/03/13    non obstructive disease LCX 20%; LAD 20%; stents in prox RCA and mid are patent, mild in stent stenosis POM, 30%, normal EF 55-65%   No family history on file. History  Substance Use Topics  . Smoking status: Former Smoker -- 1.00 packs/day for 50 years    Types: Cigarettes    Quit date: 01/09/2008  . Smokeless tobacco: Never Used     Comment: rolled own cigarettes since age of 48,   . Alcohol Use: No     Comment: occassional     Review of Systems  Constitutional: Positive for diaphoresis. Negative for fever.  Respiratory: Positive for shortness of breath and wheezing. Negative for sputum production.   Cardiovascular: Positive for chest pain.  Gastrointestinal: Negative for vomiting.  All other systems reviewed and are negative.     Allergies  Review of patient's allergies indicates no known allergies.  Home Medications   Prior to Admission medications   Medication Sig Start Date End Date Taking? Authorizing Provider  albuterol (PROVENTIL HFA;VENTOLIN HFA) 108 (90 BASE) MCG/ACT inhaler Inhale 2 puffs into the lungs every 6 (six) hours as needed for wheezing or shortness of breath.   Yes Historical Provider, MD  albuterol (PROVENTIL) (2.5 MG/3ML) 0.083% nebulizer solution Take 3 mLs (2.5 mg total) by nebulization 2 (two) times daily. And as needed. Dx 496,  FILE MCR PART B 10/22/12  Yes Elsie Stain, MD  amLODipine (NORVASC) 5 MG tablet Take 5 mg by mouth daily.    Yes Historical Provider, MD  aspirin 81 MG tablet Take 81 mg by mouth daily.    Yes Historical Provider, MD  atorvastatin (LIPITOR) 40 MG tablet Take 40 mg by mouth every morning.   Yes  Historical Provider, MD  budesonide-formoterol (SYMBICORT) 160-4.5 MCG/ACT inhaler Inhale 2 puffs into the lungs 2 (two) times daily.   Yes Historical Provider, MD  ezetimibe (ZETIA) 10 MG tablet Take 1 tablet (10 mg total) by mouth daily. 07/07/13  Yes Peter M [name], MD  omeprazole (PRILOSEC) 20 MG capsule Take 1 capsule (20 mg total) by mouth 2 (two) times daily before a meal. 08/04/13  Yes Cecilie Kicks, NP  omeprazole (PRILOSEC) 20 MG capsule Take 20 mg by mouth daily.   Yes Historical Provider, MD  potassium chloride SA (K-DUR,KLOR-CON) 20 MEQ tablet Take 20 mEq by mouth daily.   Yes Historical Provider, MD  predniSONE (DELTASONE) 10 MG tablet Take 1 tablet (10 mg total) by mouth daily with breakfast. Take 4 tabs every morning for 2 days 08/28/13 and 08/29/13 Take 3 tabs every morning for 2 days 08/30/13 and 08/31/13 Then 2 tabs every morning for 2 days 09/01/13 and 09/02/13 Then resume 10 mg daily 08/26/13  Yes Cecilie Kicks, NP  SPIRIVA HANDIHALER 18 MCG inhalation capsule Place 18 mcg into inhaler and inhale daily.  08/18/12  Yes Historical Provider, MD  nitroGLYCERIN (NITROSTAT) 0.4 MG SL tablet Place 1 tablet (0.4 mg total) under the tongue every 5 (five) minutes as needed for chest pain. 06/18/12   Peter M [name], MD   BP 119/73  Pulse 106  Temp(Src) 99.3 F (37.4 C) (Oral)  Resp 19  SpO2 97% Physical Exam  Nursing note and vitals reviewed. Constitutional: He appears distressed.  HENT:  Head: Normocephalic and atraumatic.  Right Ear: External ear normal.  Left Ear: External ear normal.  Eyes: Conjunctivae are normal. Right eye exhibits no discharge. Left eye exhibits no discharge. No scleral icterus.  Neck: Neck supple. No tracheal deviation present.  Cardiovascular: Normal rate, regular rhythm and intact distal pulses.   Pulmonary/Chest: Accessory muscle usage present. No stridor. Tachypnea noted. He is in respiratory distress. He has decreased breath sounds. He has wheezes. He has  rales.  Abdominal: Soft. Bowel sounds are normal. He exhibits no distension. There is no tenderness. There is no rebound and no guarding.  Musculoskeletal: He exhibits no edema and no tenderness.  Neurological: He is alert. He has normal strength. No cranial nerve deficit (no facial droop, extraocular movements intact, no slurred speech) or sensory deficit. He exhibits normal muscle tone. He displays no seizure activity. Coordination normal.  Skin: Skin is warm. No rash noted. He is diaphoretic.  Psychiatric: He has a normal mood and affect.    ED Course  Procedures (including critical care time) Labs Review Labs Reviewed  CBC - Abnormal; Notable for the following:    WBC 11.9 (*)    All other components within normal limits  I-STAT CHEM 8, ED - Abnormal; Notable  for the following:    Potassium 3.0 (*)    Glucose, Bld 129 (*)    Calcium, Ion 1.05 (*)    All other components within normal limits  I-STAT ARTERIAL BLOOD GAS, ED - Abnormal; Notable for the following:    pH, Arterial 7.454 (*)    pO2, Arterial 161.0 (*)    Bicarbonate 29.2 (*)    Acid-Base Excess 5.0 (*)    All other components within normal limits  PRO B NATRIURETIC PEPTIDE  BASIC METABOLIC PANEL  Randolm Idol, ED    Imaging Review Dg Chest Port 1 View  08/26/2013   CLINICAL DATA:  Respiratory distress.  EXAM: PORTABLE CHEST - 1 VIEW  COMPARISON:  08/01/2013 radiographs.  CT 02/13/2013.  FINDINGS: 1713 hr. The heart size and mediastinal contours are normal. The lungs are clear. There is no pleural effusion or pneumothorax. No acute osseous findings are demonstrated. Telemetry leads overlie the chest.  IMPRESSION: No active cardiopulmonary process.   Electronically Signed   By: Camie Patience M.D.   On: 08/26/2013 17:20     EKG Interpretation   Date/Time:  Wednesday August 26 2013 16:44:50 EDT Ventricular Rate:  128 PR Interval:  129 QRS Duration: 89 QT Interval:  339 QTC Calculation: 495 R Axis:   87 Text  Interpretation:  Sinus tachycardia Borderline right axis deviation  Borderline prolonged QT interval Since last tracing rate faster Confirmed  by Areyanna Figeroa  MD-J, Ryker [place]bury ([id number]) on 08/26/2013 4:53:29 PM      MDM  1645 patient appears to be in severe respiratory distress. Significant labored breathing. Labs and x-rays.  We will start BiPAP and reassess. If the patient does not improve he may end up requiring admission. 1730  the patient is significantly improved after the treatment with BiPAP. He is no longer diaphoretic he is no longer struggling. Patient admits he is feeling much better.  Final diagnoses:  COPD exacerbation    Patient's EKG is unremarkable. His chest x-ray does not show evidence of congestive heart failure or pneumonia. I suspect his symptoms are related to a COPD exacerbation.  His hypokalemia is most likely related to the beta agonist treatments.  Plan on admission to the hospital for further treatment.    Dorie Rank, MD 08/26/13 1750  Discussed with Dr Wyline Copas.  Will take patient off Bipap and see how he is doing.  Pt appears much more comfortable.  May be able to go to regular floor as opposed to stepdown.  Dorie Rank, MD 08/26/13 [phone]

## 2013-08-26 NOTE — Progress Notes (Signed)
08/30/2013   PCP: [name], MD   Chief Complaint  Patient presents with  . Follow-up    post hospital for chest pain, pt c/o swelling in leg and SOB, dizziness and blurred vision    Primary Cardiologist:  Dr. P. [name]   HPI:  68 year old male followed by Dr. [name] is here today for hospital followup. He was admitted for chest pain. He also was noted to be short of breath he has COPD and had more wheezing. He has a history of coronary disease with a STEMI in 2014 with a bare-metal stent to the RCA. EF was normal. Patient underwent cardiac catheterization on this admission and he had nonobstructive coronary disease with stents in the proximal and mid RCA were patent the stent in the POM with mild diffuse disease. He had normal LV function. It was thought his chest pain was related to COPD exacerbation. He was on nonsteroidal taper.  Presents today with continued shortness of breath him not sure he was taking his steroids appropriately he had an interpreter with him today.  He has not been using his nebulizer at home secondary to financial considerations.  He asked me to refill his steroids which I did he is tight on exam.  He denies any chest pain. I explained to the patient and the interpreter that he needed to go to the emergency room if the shortness of breath did not improve with the higher dose of steroids.  No Known Allergies  No current facility-administered medications for this visit.   Current Outpatient Prescriptions  Medication Sig Dispense Refill  . [DISCONTINUED] arformoterol (BROVANA) 15 MCG/2ML NEBU Take 2 mLs (15 mcg total) by nebulization 2 (two) times daily. Dx code: 496  120 mL  11  . [DISCONTINUED] budesonide (PULMICORT) 0.5 MG/2ML nebulizer solution Take 2 mLs (0.5 mg total) by nebulization 2 (two) times daily. DX:  496  120 mL  11  . [DISCONTINUED] esomeprazole (NEXIUM) 40 MG capsule Take 1 capsule (40 mg total) by mouth daily.  30 capsule  6     Facility-Administered Medications Ordered in Other Visits  Medication Dose Route Frequency Provider Last Rate Last Dose  . acetaminophen (TYLENOL) tablet 650 mg  650 mg Oral Q6H PRN Donne Hazel, MD       Or  . acetaminophen (TYLENOL) suppository 650 mg  650 mg Rectal Q6H PRN Donne Hazel, MD      . aspirin chewable tablet 81 mg  81 mg Oral Daily Donne Hazel, MD   81 mg at 08/30/13 1013  . atorvastatin (LIPITOR) tablet 40 mg  40 mg Oral q morning - 10a Donne Hazel, MD   40 mg at 08/30/13 1013  . budesonide-formoterol (SYMBICORT) 160-4.5 MCG/ACT inhaler 2 puff  2 puff Inhalation BID Cherene Altes, MD   2 puff at 08/30/13 1001  . dextromethorphan-guaiFENesin (MUCINEX DM) 30-600 MG per 12 hr tablet 1 tablet  1 tablet Oral BID Allie Bossier, MD   1 tablet at 08/30/13 1013  . ezetimibe (ZETIA) tablet 10 mg  10 mg Oral Daily Donne Hazel, MD   10 mg at 08/30/13 1014  . heparin injection 5,000 Units  5,000 Units Subcutaneous 3 times per day Donne Hazel, MD   5,000 Units at 08/30/13 1535  . insulin aspart (novoLOG) injection 0-5 Units  0-5 Units Subcutaneous QHS Samella Parr, NP      . insulin aspart (  novoLOG) injection 0-9 Units  0-9 Units Subcutaneous TID WC Samella Parr, NP   3 Units at 08/30/13 2015  . levalbuterol (XOPENEX) nebulizer solution 0.63 mg  0.63 mg Nebulization Q3H PRN Cherene Altes, MD      . levofloxacin [facility]) tablet 500 mg  500 mg Oral Daily Cherene Altes, MD   500 mg at 08/30/13 1014  . lisinopril (PRINIVIL,ZESTRIL) tablet 5 mg  5 mg Oral Daily Cherene Altes, MD   5 mg at 08/30/13 1014  . morphine 2 MG/ML injection 2 mg  2 mg Intravenous Q4H PRN Donne Hazel, MD   2 mg at 08/28/13 2217  . nitroGLYCERIN (NITROSTAT) SL tablet 0.4 mg  0.4 mg Sublingual Q5 min PRN Donne Hazel, MD      . ondansetron [facility]) tablet 4 mg  4 mg Oral Q6H PRN Donne Hazel, MD       Or  . ondansetron [facility]) injection 4 mg  4 mg Intravenous Q6H PRN Donne Hazel, MD      . pantoprazole (PROTONIX) EC tablet 40 mg  40 mg Oral Daily Donne Hazel, MD   40 mg at 08/30/13 1000  . [START ON 08/31/2013] predniSONE (DELTASONE) tablet 20 mg  20 mg Oral Q breakfast Cherene Altes, MD      . sodium chloride 0.9 % injection 3 mL  3 mL Intravenous Q12H Donne Hazel, MD   3 mL at 08/30/13 1015  . tiotropium (SPIRIVA) inhalation capsule 18 mcg  18 mcg Inhalation Daily Cherene Altes, MD   18 mcg at 08/30/13 1001    Past Medical History  Diagnosis Date  . COPD (chronic obstructive pulmonary disease)     former smoker  . GERD (gastroesophageal reflux disease)   . CAD (coronary artery disease), native coronary artery 6/11, 2/14, 6/14; 7/15    a. thought due to vasospasm . cath - CAD LAD 40-50%, LCX 40%, RCA 25% EF 65%. lexiscan myoview EF 68% w/o evidence of ischemia or infarct b. STEMI s/p BMS-mid RCA c. BMS x 2- prox RCA, PLOM  . HTN (hypertension)   . Asthma   . Hyperlipidemia   . Tobacco abuse   . Allergic rhinitis     Past Surgical History  Procedure Laterality Date  . Left foot surgery      repair - and ankle   . Coronary angioplasty with stent placement  02/15/12    20% distal LM, 30-40% mid LAD, 50% ostial diag, 50-60% mid LCx, 99% focal mid RCA stenosis s/p BMS; 20% distal RCA disease, 50% prox PLB; LVEF 55-65%  . Coronary angioplasty with stent placement  07/01/12    30% prox LAD, 30% mid LAD, 20% mid LCx, 90% prox RCA s/p BMS, patent mid RCA stent, 30-40% distal RCA, 80% PLB s/p BMS; EF 55-65%  . Cardiac catheterization  08/03/13    non obstructive disease LCX 20%; LAD 20%; stents in prox RCA and mid are patent, mild in stent stenosis POM, 30%, normal EF 55-65%    TIW:PYKDXIP:JA colds or fevers, no weight changes + SOB Skin:no rashes or ulcers HEENT:no blurred vision, no congestion CV:see HPI PUL:see HPI GI:no diarrhea constipation or melena, no indigestion GU:no hematuria, no dysuria MS:no joint pain, no claudication Neuro:no  syncope, no lightheadedness Endo:no diabetes, no thyroid disease  Wt Readings from Last 3 Encounters:  08/29/13 147 lb 0.8 oz (66.7 kg)  08/26/13 149 lb 3.2 oz (67.677 kg)  08/04/13  145 lb 3.2 oz (65.862 kg)    PHYSICAL EXAM BP 114/64  Pulse 87  Ht 5\' 3"  (1.6 m)  Wt 149 lb 3.2 oz (67.677 kg)  BMI 26.44 kg/m2  SpO2 92% General:Pleasant affect, NAD Skin:Warm and dry, brisk capillary refill HEENT:normocephalic, sclera clear, mucus membranes moist Neck:supple, no JVD, no bruits  Heart:S1S2 RRR without murmur, gallup, rub or click Lungs:diminished breath sounds without rales, rhonchi, + wheezes KGS:UPJS, non tender, + BS, do not palpate liver spleen or masses Ext:no lower ext edema, 2+ pedal pulses, 2+ radial pulses Neuro:alert and oriented, MAE, follows commands, + facial symmetry EKG: Sinus rhythm no acute changes heart rate 87  ASSESSMENT AND PLAN CAD, hx of stents to RCA and patent to nonobstructive on cath 03/06/13 Stable coronary artery disease on recent cardiac cath no chest pain reported. Continues with shortness of breath secondary to COPD  COPD exacerbation Recent COPD exacerbation with chest pain with stable cardiac cath.  I've asked the patient he uses albuterol inhaler and to use his albute nebulizer as well, he does not have the medication, I asked him to see his primary care today or to go to the emergency room. I refilled his steroids to take 4 tablets for 2 days 3 tablets for 2 days 2 tablets for 2 days and then resume 10 mg daily who called this into the pharmacy.   In no acute distress currently but he definitely needs nebulizers.  Essential hypertension Stable

## 2013-08-26 NOTE — H&P (Addendum)
[facility] History and Physical  [name] NFA:[id number] DOB: [birth year] DOA: 08/26/2013  Referring physician: Emergency Department PCP: [name], MD  Specialists:   Chief Complaint: SOB  HPI: [name] is a 68 y.o. male  With a hx of prior tobacco abuse (quit 54yrs ago), cad, htn, and copd who presents to the ed with worsening sob and wheezing. Pt reportedly tried bronchodilators at home without improvement. Sx cont to worsen and pt ultimately presented to ED. En route, pt was given IV steroids and breathing tx without significant improvement. Upon arrival to ED, pt was started on bipap and cont on breathing tx with marked improvement afterwards. Breath sounds initially were diminished with wheezing, since improved with multiple nebs. CXR in the ED was found to be unremarkable.  When seen, pt was weaned off bipap. Daughter at bedside reports pt seems much improved, however pt remains with increased work of breathing, wheezing, and diaphoresis. Pt reports mild chest "tightness" worse with deep inspiration. No sick contacts however daughter is concerned about possible dust exposure at home.  Review of Systems: Per above, the remainder of the 10pt ros reviewed and are neg  Past Medical History  Diagnosis Date  . COPD (chronic obstructive pulmonary disease)     former smoker  . GERD (gastroesophageal reflux disease)   . CAD (coronary artery disease), native coronary artery 6/11, 2/14, 6/14; 7/15    a. thought due to vasospasm . cath - CAD LAD 40-50%, LCX 40%, RCA 25% EF 65%. lexiscan myoview EF 68% w/o evidence of ischemia or infarct b. STEMI s/p BMS-mid RCA c. BMS x 2- prox RCA, PLOM  . HTN (hypertension)   . Asthma   . Hyperlipidemia   . Tobacco abuse   . Allergic rhinitis    Past Surgical History  Procedure Laterality Date  . Left foot surgery      repair - and ankle   . Coronary angioplasty with stent placement  02/15/12    20% distal LM, 30-40% mid LAD, 50% ostial  diag, 50-60% mid LCx, 99% focal mid RCA stenosis s/p BMS; 20% distal RCA disease, 50% prox PLB; LVEF 55-65%  . Coronary angioplasty with stent placement  07/01/12    30% prox LAD, 30% mid LAD, 20% mid LCx, 90% prox RCA s/p BMS, patent mid RCA stent, 30-40% distal RCA, 80% PLB s/p BMS; EF 55-65%  . Cardiac catheterization  08/03/13    non obstructive disease LCX 20%; LAD 20%; stents in prox RCA and mid are patent, mild in stent stenosis POM, 30%, normal EF 55-65%   Social History:  reports that he quit smoking about 5 years ago. His smoking use included Cigarettes. He has a 50 pack-year smoking history. He has never used smokeless tobacco. He reports that he does not drink alcohol or use illicit drugs.  where does patient live--home, ALF, SNF? and with whom if at home?  Can patient participate in ADLs?  No Known Allergies  No family history on file. reviewed and is noncontributory to this particular case (be sure to complete)  Prior to Admission medications   Medication Sig Start Date End Date Taking? Authorizing Provider  albuterol (PROVENTIL HFA;VENTOLIN HFA) 108 (90 BASE) MCG/ACT inhaler Inhale 2 puffs into the lungs every 6 (six) hours as needed for wheezing or shortness of breath.   Yes Historical Provider, MD  albuterol (PROVENTIL) (2.5 MG/3ML) 0.083% nebulizer solution Take 3 mLs (2.5 mg total) by nebulization 2 (two) times daily. And as needed. Dx 496,  FILE MCR PART B 10/22/12  Yes Elsie Stain, MD  amLODipine (NORVASC) 5 MG tablet Take 5 mg by mouth daily.    Yes Historical Provider, MD  aspirin 81 MG tablet Take 81 mg by mouth daily.    Yes Historical Provider, MD  atorvastatin (LIPITOR) 40 MG tablet Take 40 mg by mouth every morning.   Yes Historical Provider, MD  budesonide-formoterol (SYMBICORT) 160-4.5 MCG/ACT inhaler Inhale 2 puffs into the lungs 2 (two) times daily.   Yes Historical Provider, MD  ezetimibe (ZETIA) 10 MG tablet Take 1 tablet (10 mg total) by mouth daily.  07/07/13  Yes Peter M [name], MD  omeprazole (PRILOSEC) 20 MG capsule Take 1 capsule (20 mg total) by mouth 2 (two) times daily before a meal. 08/04/13  Yes Cecilie Kicks, NP  omeprazole (PRILOSEC) 20 MG capsule Take 20 mg by mouth daily.   Yes Historical Provider, MD  potassium chloride SA (K-DUR,KLOR-CON) 20 MEQ tablet Take 20 mEq by mouth daily.   Yes Historical Provider, MD  predniSONE (DELTASONE) 10 MG tablet Take 1 tablet (10 mg total) by mouth daily with breakfast. Take 4 tabs every morning for 2 days 08/28/13 and 08/29/13 Take 3 tabs every morning for 2 days 08/30/13 and 08/31/13 Then 2 tabs every morning for 2 days 09/01/13 and 09/02/13 Then resume 10 mg daily 08/26/13  Yes Cecilie Kicks, NP  SPIRIVA HANDIHALER 18 MCG inhalation capsule Place 18 mcg into inhaler and inhale daily.  08/18/12  Yes Historical Provider, MD  nitroGLYCERIN (NITROSTAT) 0.4 MG SL tablet Place 1 tablet (0.4 mg total) under the tongue every 5 (five) minutes as needed for chest pain. 06/18/12   Peter M [name], MD   Physical Exam: Filed Vitals:   08/26/13 1700 08/26/13 1715 08/26/13 1804 08/26/13 1808  BP: 119/77 119/73  126/68  Pulse: 114 106  96  Temp:      TempSrc:      Resp: 19 19  18   SpO2: 98% 97% 97% 96%     General:  Awake, appears diaphoretic, increased work of breathing  Eyes: PERRL B  ENT: membranes moist, dentition fair  Neck: trachea midline, neck supple  Cardiovascular: tachycardic, s1, s2  Respiratory: increased work, of breathing, decreased bs throughout with end-expiratory wheezing  Abdomen: soft, nondistended  Skin: normal skin turgor, no abnormal skin lesions seen  Musculoskeletal: perfused, no clubbing  Psychiatric: mood/affect normal//no auditory/visual hallucinations  Neurologic: cn2-12 grossly intact, strength/sensation intact  Labs on Admission:  Basic Metabolic Panel:  Recent Labs Lab 08/26/13 1653 08/26/13 1704  NA 142 140  K 3.3* 3.0*  CL 102 102  CO2 24  --   GLUCOSE  123* 129*  BUN 15 15  CREATININE 0.87 0.90  CALCIUM 8.7  --    Liver Function Tests: No results found for this basename: AST, ALT, ALKPHOS, BILITOT, PROT, ALBUMIN,  in the last 168 hours No results found for this basename: LIPASE, AMYLASE,  in the last 168 hours No results found for this basename: AMMONIA,  in the last 168 hours CBC:  Recent Labs Lab 08/26/13 1653 08/26/13 1704  WBC 11.9*  --   HGB 15.3 16.7  HCT 46.0 49.0  MCV 90.7  --   PLT 257  --    Cardiac Enzymes: No results found for this basename: CKTOTAL, CKMB, CKMBINDEX, TROPONINI,  in the last 168 hours  BNP (last 3 results)  Recent Labs  08/01/13 1155 08/26/13 1653  PROBNP 60.5 84.4   CBG:  No results found for this basename: GLUCAP,  in the last 168 hours  Radiological Exams on Admission: Dg Chest Port 1 View  08/26/2013   CLINICAL DATA:  Respiratory distress.  EXAM: PORTABLE CHEST - 1 VIEW  COMPARISON:  08/01/2013 radiographs.  CT 02/13/2013.  FINDINGS: 1713 hr. The heart size and mediastinal contours are normal. The lungs are clear. There is no pleural effusion or pneumothorax. No acute osseous findings are demonstrated. Telemetry leads overlie the chest.  IMPRESSION: No active cardiopulmonary process.   Electronically Signed   By: Camie Patience M.D.   On: 08/26/2013 17:20    EKG: Independently reviewed. Sinus tach  Assessment/Plan Principal Problem:   OBSTRUCTIVE CHRONIC BRONCHITIS Active Problems:   Essential hypertension   Asthma exacerbation in COPD   COPD exacerbation   1. Acute COPD exacerbation 1. Improved with IV steroids, nebs, and bipap in ED although pt still appears with increased work of breathing, has decreased BS throughout, and appears diaphoretic 2. CXR unremarkable 3. Will cont scheduled IV steroids 60mg  IV q6hrs 4. Cont scheduled duonebs q4hrs with q2PRN 5. Cont to wean O2 as tolerated 6. Will start empiric levaquin given severity of sx as well as mildly elevated WBC and low  grade temp of 6F 7. Admit to stepdown for closer monitoring 8. Pt is agreeable to mechanical ventilation should the need arise 2. HTN 1. BP stable 2. Cont home meds 3. CAD 1. Mild chest pain/pressure reported, although I suspect this may be musculoskeletal component in setting of COPD exacerbation 2. EKG unremarkable 3. Will follow serial enzymes 4. Hypokalemia 1. Replaced in ED 2. Follow lytes and cont to replace as needed 5. Leukocytosis 1. Per above, empiric levaquin started 2. Monitor 6. DVT prophylaxis 1. Heparin subq  Code Status: Full Family Communication: Pt in room, daughter at bedside Disposition Plan: Home when symptoms resolve  Time spent: 50min  CH[facility], [facility] Pager [phone]  If 7PM-7AM, please contact night-coverage [email or web address] Password [facility] 08/26/2013, 6:44 PM

## 2013-08-26 NOTE — ED Notes (Signed)
Pt tolerating BiPap well at this time, no distress noted.

## 2013-08-26 NOTE — ED Notes (Signed)
Respiratory at bedside putting pt on Bipap

## 2013-08-26 NOTE — ED Notes (Addendum)
Pt to ED via [facility] from home- per pt for the past 3 days pt reports respiratory distress at home, upon EMS arrival pt tripod using accessory muscles to breath and diaphoretic- EMS administered 10 of Albuterol without relief and 125 Solumedrol.  Dr. Tomi Bamberger at bedside upon arrival.

## 2013-08-27 DIAGNOSIS — E876 Hypokalemia: Secondary | ICD-10-CM

## 2013-08-27 DIAGNOSIS — R7309 Other abnormal glucose: Secondary | ICD-10-CM

## 2013-08-27 DIAGNOSIS — J441 Chronic obstructive pulmonary disease with (acute) exacerbation: Secondary | ICD-10-CM | POA: Diagnosis not present

## 2013-08-27 DIAGNOSIS — J45901 Unspecified asthma with (acute) exacerbation: Principal | ICD-10-CM

## 2013-08-27 LAB — COMPREHENSIVE METABOLIC PANEL
ALT: 45 U/L (ref 0–53)
ANION GAP: 19 — AB (ref 5–15)
AST: 18 U/L (ref 0–37)
Albumin: 3.2 g/dL — ABNORMAL LOW (ref 3.5–5.2)
Alkaline Phosphatase: 74 U/L (ref 39–117)
BUN: 18 mg/dL (ref 6–23)
CO2: 22 mEq/L (ref 19–32)
Calcium: 9 mg/dL (ref 8.4–10.5)
Chloride: 99 mEq/L (ref 96–112)
Creatinine, Ser: 0.83 mg/dL (ref 0.50–1.35)
GFR calc non Af Amer: 88 mL/min — ABNORMAL LOW (ref >90)
Glucose, Bld: 283 mg/dL — ABNORMAL HIGH (ref 70–99)
Potassium: 4.1 mEq/L (ref 3.7–5.3)
Sodium: 140 mEq/L (ref 137–147)
TOTAL PROTEIN: 6.4 g/dL (ref 6.0–8.3)
Total Bilirubin: 0.4 mg/dL (ref 0.3–1.2)

## 2013-08-27 LAB — TROPONIN I: Troponin I: <0.3 ng/mL (ref <0.30)

## 2013-08-27 LAB — CBC
HCT: 43.1 % (ref 39.0–52.0)
HEMOGLOBIN: 14.1 g/dL (ref 13.0–17.0)
MCH: 29.4 pg (ref 26.0–34.0)
MCHC: 32.7 g/dL (ref 30.0–36.0)
MCV: 89.8 fL (ref 78.0–100.0)
PLATELETS: 258 10*3/uL (ref 150–400)
RBC: 4.8 MIL/uL (ref 4.22–5.81)
RDW: 14.3 % (ref 11.5–15.5)
WBC: 8.5 10*3/uL (ref 4.0–10.5)

## 2013-08-27 LAB — GLUCOSE, CAPILLARY
GLUCOSE-CAPILLARY: 193 mg/dL — AB (ref 70–99)
GLUCOSE-CAPILLARY: 170 mg/dL — AB (ref 70–99)
GLUCOSE-CAPILLARY: 151 mg/dL — AB (ref 70–99)
Glucose-Capillary: 145 mg/dL — ABNORMAL HIGH (ref 70–99)

## 2013-08-27 LAB — HEMOGLOBIN A1C
Hgb A1c MFr Bld: 7.4 % — ABNORMAL HIGH (ref <5.7)
Mean Plasma Glucose: 166 mg/dL — ABNORMAL HIGH (ref <117)

## 2013-08-27 MED ORDER — INSULIN ASPART 100 UNIT/ML ~~LOC~~ SOLN
0.0000 [IU] | Freq: Three times a day (TID) | SUBCUTANEOUS | Status: DC
  Administered 2013-08-27 (×2): 2 [IU] via SUBCUTANEOUS
  Administered 2013-08-29: 3 [IU] via SUBCUTANEOUS
  Administered 2013-08-29: 2 [IU] via SUBCUTANEOUS
  Administered 2013-08-29: 1 [IU] via SUBCUTANEOUS
  Administered 2013-08-30 (×2): 3 [IU] via SUBCUTANEOUS
  Administered 2013-08-31: 1 [IU] via SUBCUTANEOUS

## 2013-08-27 MED ORDER — LEVALBUTEROL HCL 0.63 MG/3ML IN NEBU
0.6300 mg | INHALATION_SOLUTION | Freq: Four times a day (QID) | RESPIRATORY_TRACT | Status: DC | PRN
  Administered 2013-08-27: 0.63 mg via RESPIRATORY_TRACT
  Filled 2013-08-27: qty 3

## 2013-08-27 MED ORDER — INSULIN ASPART 100 UNIT/ML ~~LOC~~ SOLN
0.0000 [IU] | Freq: Every day | SUBCUTANEOUS | Status: DC
  Administered 2013-08-30: 2 [IU] via SUBCUTANEOUS

## 2013-08-27 MED ORDER — DM-GUAIFENESIN ER 30-600 MG PO TB12
1.0000 | ORAL_TABLET | Freq: Two times a day (BID) | ORAL | Status: DC
  Administered 2013-08-27 – 2013-08-31 (×8): 1 via ORAL
  Filled 2013-08-27 (×9): qty 1

## 2013-08-27 MED ORDER — METHYLPREDNISOLONE SODIUM SUCC 125 MG IJ SOLR
60.0000 mg | Freq: Every day | INTRAMUSCULAR | Status: DC
  Administered 2013-08-28 – 2013-08-29 (×2): 60 mg via INTRAVENOUS
  Filled 2013-08-27 (×2): qty 0.96

## 2013-08-27 NOTE — Progress Notes (Signed)
Pt not to wear bipap tonight, only if needed. Left in room by bedside. RT will continue to monitor.

## 2013-08-27 NOTE — Progress Notes (Signed)
Utilization Review Completed.  

## 2013-08-27 NOTE — Progress Notes (Addendum)
[name]  Edgar Reisz KTG:[id number] DOB: 06-06-1945 DOA: 08/26/2013 PCP: [name], MD   Brief narrative: 68 yo Asian male with PMH of COPD, CAD, HTN, and tobacco abuse (quit 2 years ago), presented to the ED with worsening SOB and wheezing, despite bronchodilator treatments at home.  En route, he was give IV steroids, and breathing treatment with minimal relief.  In the ED, He was started on BIPAP and  breathing treatments were continued, with marked improvements in symptoms.  When weaned off of the BIPAP, he felt short of breath, had increased work of breathing, wheezing, and was diaphoretic.  CXR obtained was unremarkable.   Patient denies sick exposure, but daughter is concerned about dust exposure at home.   HPI/Subjective: Alert- patient is sitting up in the bed, states he is "tight" in his chest, and has increased work of breathing when he talks.   Assessment/Plan:   Acute hypoxic respiratory failure due to COPD exacerbation -patient off BiPAP and on cannula oxygen -chest tightness and wheezing markedly improved but not resolved so monitor closely in event needs to resume BiPAP -IV steroids continued at 60mg  IV q6hrs -cancelled Duoneb in favor of Xopenex due to tachycardia and chest tightness with duoneb  -Levaquin is started empirically given severity of symptoms. -Flutter valve q 4hr while awake -Mucinex DM BID  Essential hypertension -BP stable  -continue home medication  Hyperglycemia  -improved from 354 on 8/19 to 145 on 8/20 -Suspect due to IV steroids  -Novolog sliding scale Q8H -Patient denies personal history or family history of diabetes -will obtain A1c  Hypokalemia -repleted and now resolved    DVT prophylaxis:  Heparin sq Code Status:  Full Family Communication: No family at bedside Disposition Plan/Expected LOS: Remain in SDU- high risk to resume BiPAP   Consultants: None   Procedures: None    Cultures:   8/20- Negative MRSA PCR  Antibiotics: 8/19 Levaquin >>>  Objective: Blood pressure 131/75, pulse 96, temperature 97.5 F (36.4 C), temperature source Oral, resp. rate 20, height 5\' 4"  (1.626 m), weight 66.8 kg (147 lb 4.3 oz), SpO2 100.00%.  Intake/Output Summary (Last 24 hours) at 08/27/13 1102 Last data filed at 08/27/13 0600  Gross per 24 hour  Intake    100 ml  Output    700 ml  Net   -600 ml     Exam: Gen: alert, patient  has increased work of breathing when he talks Chest: decreased breath sounds in the back lower lung fields w/poor air movement throughout, on RA but with observed minimal exertion dyspnea Cardiac: Regular rate and rhythm, S1-S2, no rubs murmurs or gallops, no peripheral edema, no JVD Abdomen: Soft nontender nondistended without obvious hepatosplenomegaly, no ascites Extremities: Symmetrical in appearance without cyanosis, clubbing or effusion  Scheduled Meds:  Scheduled Meds: . amLODipine  5 mg Oral Daily  . aspirin  81 mg Oral Daily  . atorvastatin  40 mg Oral q morning - 10a  . ezetimibe  10 mg Oral Daily  . heparin  5,000 Units Subcutaneous 3 times per day  . insulin aspart  0-5 Units Subcutaneous QHS  . insulin aspart  0-9 Units Subcutaneous TID WC  . levofloxacin (LEVAQUIN) IV  500 mg Intravenous Q24H  . methylPREDNISolone (SOLU-MEDROL) injection  60 mg Intravenous Q6H  . pantoprazole  40 mg Oral Daily  . potassium chloride SA  20 mEq Oral Daily  . potassium chloride  40 mEq Oral Once  .  sodium chloride  3 mL Intravenous Q12H   Continuous Infusions:   Data Reviewed: Basic Metabolic Panel:  Recent Labs Lab 08/26/13 1653 08/26/13 1704 08/27/13 0150  NA 142 140 140  K 3.3* 3.0* 4.1  CL 102 102 99  CO2 24  --  22  GLUCOSE 123* 129* 283*  BUN 15 15 18   CREATININE 0.87 0.90 0.83  CALCIUM 8.7  --  9.0   Liver Function Tests:  Recent Labs Lab 08/27/13 0150  AST 18  ALT 45  ALKPHOS 74  BILITOT 0.4  PROT 6.4   ALBUMIN 3.2*   No results found for this basename: LIPASE, AMYLASE,  in the last 168 hours No results found for this basename: AMMONIA,  in the last 168 hours CBC:  Recent Labs Lab 08/26/13 1653 08/26/13 1704 08/27/13 0150  WBC 11.9*  --  8.5  HGB 15.3 16.7 14.1  HCT 46.0 49.0 43.1  MCV 90.7  --  89.8  PLT 257  --  258   Cardiac Enzymes:  Recent Labs Lab 08/26/13 2034 08/27/13 0150 08/27/13 0800  TROPONINI <0.30 <0.30 <0.30   BNP (last 3 results)  Recent Labs  08/01/13 1155 08/26/13 1653  PROBNP 60.5 84.4   CBG:  Recent Labs Lab 08/26/13 2222 08/27/13 0818  GLUCAP 354* 145*    Recent Results (from the past 240 hour(s))  MRSA PCR SCREENING     Status: None   Collection Time    08/26/13  7:56 PM      Result Value Ref Range Status   MRSA by PCR NEGATIVE  NEGATIVE Final   Comment:            The GeneXpert MRSA Assay (FDA     approved for NASAL specimens     only), is one component of a     comprehensive MRSA colonization     surveillance program. It is not     intended to diagnose MRSA     infection nor to guide or     monitor treatment for     MRSA infections.     Studies:  Recent x-ray studies have been reviewed in detail by the Attending Physician  Time spent :    **If unable to reach the above provider after paging please contact the Flow Manager @ [email or web address], PA-C On-Call/Text Page: [email or web address]      password [email or web address]  If 7PM-7AM, please contact night-coverage [email or web address] Password [email or web address] 08/27/2013, 11:02 AM   LOS: 1 day   Examined patient with ANP Ebony Hail, discussed assessment and plan and agree with the above plan. Patient with multiple complex medical problems> 40 minutes spent on direct patient care

## 2013-08-28 LAB — CBC
HCT: 41.7 % (ref 39.0–52.0)
HEMOGLOBIN: 13.9 g/dL (ref 13.0–17.0)
MCH: 30.2 pg (ref 26.0–34.0)
MCHC: 33.3 g/dL (ref 30.0–36.0)
MCV: 90.5 fL (ref 78.0–100.0)
Platelets: 246 10*3/uL (ref 150–400)
RBC: 4.61 MIL/uL (ref 4.22–5.81)
RDW: 14.2 % (ref 11.5–15.5)
WBC: 21 10*3/uL — AB (ref 4.0–10.5)

## 2013-08-28 LAB — BASIC METABOLIC PANEL
ANION GAP: 12 (ref 5–15)
BUN: 23 mg/dL (ref 6–23)
CHLORIDE: 104 meq/L (ref 96–112)
CO2: 24 mEq/L (ref 19–32)
Calcium: 9 mg/dL (ref 8.4–10.5)
Creatinine, Ser: 0.85 mg/dL (ref 0.50–1.35)
GFR calc non Af Amer: 88 mL/min — ABNORMAL LOW (ref >90)
Glucose, Bld: 170 mg/dL — ABNORMAL HIGH (ref 70–99)
POTASSIUM: 4 meq/L (ref 3.7–5.3)
Sodium: 140 mEq/L (ref 137–147)

## 2013-08-28 LAB — GLUCOSE, CAPILLARY
GLUCOSE-CAPILLARY: 119 mg/dL — AB (ref 70–99)
GLUCOSE-CAPILLARY: 205 mg/dL — AB (ref 70–99)
Glucose-Capillary: 118 mg/dL — ABNORMAL HIGH (ref 70–99)
Glucose-Capillary: 162 mg/dL — ABNORMAL HIGH (ref 70–99)

## 2013-08-28 MED ORDER — LEVALBUTEROL HCL 0.63 MG/3ML IN NEBU
0.6300 mg | INHALATION_SOLUTION | RESPIRATORY_TRACT | Status: DC | PRN
  Filled 2013-08-28: qty 3

## 2013-08-28 NOTE — Progress Notes (Signed)
[name]  [name] ZOX:[id number] DOB: [birth year] DOA: 08/26/2013 PCP: [name], MD  Brief narrative: 68 yo male with PMH of COPD, CAD, HTN, and tobacco abuse (quit 2 years ago), who presented to the ED with worsening SOB and wheezing, despite bronchodilator treatments at home. En route, he was give IV steroids, and breathing treatments with minimal relief.   In the ED, He was started on BIPAP and breathing treatments were continued, with marked improvements in symptoms. When weaned off of the BIPAP, he felt short of breath, had increased work of breathing, wheezing, and was diaphoretic. CXR was unremarkable. Patient denies sick exposure, but daughter is concerned about dust exposure at home.   HPI/Subjective: states still feels "tight" in the chest, and has episodes of coughing.  Assessment/Plan:  Acute hypoxic respiratory failure due to COPD exacerbation -chest tightness improved but not resolved- monitor closely in event needs resumption of BiPAP -patient off nasal canula, on RA at O2 sat of 96 -IV steroids frequency decreased to 60mg  IV daily -Xopenex>>>  -Levaquin>>> -Flutter valve q 4hr while awake>>> -Mucinex DM BID>>>  Diabetes - newly diagnosed  -A1c 7.4 -Diabetes could be secondary to chronic/frequent steroid use for COPD  -Novolog sliding scare -Diabetes educator service ordered -Educated patient about following up with PCP (Dr. Jeanie Cooks) for the close management of his diabetes  Essential hypertension -BP stable - continue home meds  Hypokalemia -resolved w/ replacement   DVT prophylaxis: Heparin sq Code Status: Full Family Communication: No family at bedside  Disposition Plan/Expected LOS: Remain in SDU- high risk to resume BiPAP  Consultants: None  Procedures: None  Antibiotics: 8/19 Levaquin>>  Objective: Blood pressure 123/68, pulse 81, temperature 98 F (36.7 C), temperature source Oral, resp. rate  16, height 5\' 4"  (1.626 m), weight 67.5 kg (148 lb 13 oz), SpO2 96.00%.  Intake/Output Summary (Last 24 hours) at 08/28/13 1332 Last data filed at 08/28/13 1323  Gross per 24 hour  Intake   1283 ml  Output   1650 ml  Net   -367 ml   Exam: Gen: alert and oriented Chest: decreased breath sounds throughout the lung fields w/o active wheeze  Cardiac: Regular rate and rhythm, S1-S2, no rubs murmurs or gallops, no peripheral edema Abdomen: Soft nontender nondistended without obvious hepatosplenomegaly, no ascites Extremities: Symmetrical in appearance without cyanosis, clubbing or effusion  Scheduled Meds:  Scheduled Meds: . amLODipine  5 mg Oral Daily  . aspirin  81 mg Oral Daily  . atorvastatin  40 mg Oral q morning - 10a  . dextromethorphan-guaiFENesin  1 tablet Oral BID  . ezetimibe  10 mg Oral Daily  . heparin  5,000 Units Subcutaneous 3 times per day  . insulin aspart  0-5 Units Subcutaneous QHS  . insulin aspart  0-9 Units Subcutaneous TID WC  . levofloxacin (LEVAQUIN) IV  500 mg Intravenous Q24H  . methylPREDNISolone (SOLU-MEDROL) injection  60 mg Intravenous Daily  . pantoprazole  40 mg Oral Daily  . potassium chloride SA  20 mEq Oral Daily  . potassium chloride  40 mEq Oral Once  . sodium chloride  3 mL Intravenous Q12H    Data Reviewed: Basic Metabolic Panel:  Recent Labs Lab 08/26/13 1653 08/26/13 1704 08/27/13 0150 08/28/13 0338  NA 142 140 140 140  K 3.3* 3.0* 4.1 4.0  CL 102 102 99 104  CO2 24  --  22 24  GLUCOSE 123* 129* 283* 170*  BUN  15 15 18 23   CREATININE 0.87 0.90 0.83 0.85  CALCIUM 8.7  --  9.0 9.0   Liver Function Tests:  Recent Labs Lab 08/27/13 0150  AST 18  ALT 45  ALKPHOS 74  BILITOT 0.4  PROT 6.4  ALBUMIN 3.2*   CBC:  Recent Labs Lab 08/26/13 1653 08/26/13 1704 08/27/13 0150 08/28/13 0338  WBC 11.9*  --  8.5 21.0*  HGB 15.3 16.7 14.1 13.9  HCT 46.0 49.0 43.1 41.7  MCV 90.7  --  89.8 90.5  PLT 257  --  258 246    Cardiac Enzymes:  Recent Labs Lab 08/26/13 2034 08/27/13 0150 08/27/13 0800  TROPONINI <0.30 <0.30 <0.30   BNP (last 3 results)  Recent Labs  08/01/13 1155 08/26/13 1653  PROBNP 60.5 84.4   CBG:  Recent Labs Lab 08/27/13 1159 08/27/13 1708 08/27/13 2130 08/28/13 0834 08/28/13 1155  GLUCAP 193* 170* 151* 118* 119*    Recent Results (from the past 240 hour(s))  MRSA PCR SCREENING     Status: None   Collection Time    08/26/13  7:56 PM      Result Value Ref Range Status   MRSA by PCR NEGATIVE  NEGATIVE Final   Comment:            The GeneXpert MRSA Assay (FDA     approved for NASAL specimens     only), is one component of a     comprehensive MRSA colonization     surveillance program. It is not     intended to diagnose MRSA     infection nor to guide or     monitor treatment for     MRSA infections.     Studies:  Recent x-ray studies have been reviewed in detail by the Attending Physician  Time spent : 35 mins  Silas Flood [facility] Pager [phone]  **If unable to reach the above provider after paging please contact the [place] @ [phone]  On-Call/Text Page:      [email or web address]      password [email or web address]  If 7PM-7AM, please contact night-coverage [email or web address] Password [email or web address] 08/28/2013, 1:32 PM   LOS: 2 days   I have personally examined this patient and reviewed the entire database. I have reviewed the above note, made any necessary editorial changes, and agree with its content.  Cherene Altes, MD [facility]

## 2013-08-29 LAB — GLUCOSE, CAPILLARY
GLUCOSE-CAPILLARY: 197 mg/dL — AB (ref 70–99)
GLUCOSE-CAPILLARY: 137 mg/dL — AB (ref 70–99)
Glucose-Capillary: 150 mg/dL — ABNORMAL HIGH (ref 70–99)
Glucose-Capillary: 139 mg/dL — ABNORMAL HIGH (ref 70–99)
Glucose-Capillary: 234 mg/dL — ABNORMAL HIGH (ref 70–99)

## 2013-08-29 MED ORDER — TIOTROPIUM BROMIDE MONOHYDRATE 18 MCG IN CAPS
18.0000 ug | ORAL_CAPSULE | Freq: Every day | RESPIRATORY_TRACT | Status: DC
  Administered 2013-08-30 – 2013-08-31 (×2): 18 ug via RESPIRATORY_TRACT
  Filled 2013-08-29: qty 5

## 2013-08-29 MED ORDER — PREDNISONE 20 MG PO TABS
40.0000 mg | ORAL_TABLET | Freq: Every day | ORAL | Status: DC
  Administered 2013-08-30: 40 mg via ORAL
  Filled 2013-08-29 (×2): qty 2

## 2013-08-29 MED ORDER — LISINOPRIL 5 MG PO TABS
5.0000 mg | ORAL_TABLET | Freq: Every day | ORAL | Status: DC
  Administered 2013-08-29 – 2013-08-31 (×3): 5 mg via ORAL
  Filled 2013-08-29 (×3): qty 1

## 2013-08-29 MED ORDER — BUDESONIDE-FORMOTEROL FUMARATE 160-4.5 MCG/ACT IN AERO
2.0000 | INHALATION_SPRAY | Freq: Two times a day (BID) | RESPIRATORY_TRACT | Status: DC
  Administered 2013-08-29 – 2013-08-31 (×3): 2 via RESPIRATORY_TRACT
  Filled 2013-08-29 (×2): qty 6

## 2013-08-29 MED ORDER — LEVOFLOXACIN 750 MG PO TABS: 750.0000 mg | ORAL_TABLET | Freq: Every day | ORAL | Status: DC

## 2013-08-29 MED ORDER — LEVOFLOXACIN 500 MG PO TABS
500.0000 mg | ORAL_TABLET | Freq: Every day | ORAL | Status: DC
  Administered 2013-08-29 – 2013-08-31 (×3): 500 mg via ORAL
  Filled 2013-08-29 (×3): qty 1

## 2013-08-29 NOTE — Progress Notes (Signed)
Received on unit at [place], very sob, came up in [place]. Will allow to rest, check sat to see if needs o2 on. Sat is 95% on r/a, oriented to unit.

## 2013-08-29 NOTE — Progress Notes (Signed)
[name]  [name] TJQ:[id number] DOB: 19-Apr-1945 DOA: 08/26/2013 PCP: [name], MD  Brief narrative: 68 yo male with PMH of COPD, CAD, HTN, and tobacco abuse (quit 2 years ago), who presented to the ED with worsening SOB and wheezing, despite bronchodilator treatments at home. En route, he was give IV steroids, and breathing treatments with minimal relief.   In the ED, he was started on BIPAP and breathing treatments were continued, with marked improvements in symptoms. When weaned off of the BIPAP, he felt short of breath, had increased work of breathing, wheezing, and was diaphoretic. CXR was unremarkable.   Since admission the pt has slowly improved.  He is now free of BIPAP, and requiring only nasal cannula oxygen.  We will begin to taper steroids, begin to ambulate w/ assist, and determine if home O2 will be necessary.  He is stable enough for transfer to a medical bed.     HPI/Subjective: Pt states he still feels sob.  He appears to be resting more comfortably.  Denies cp, n/v, or abdom pain.    Assessment/Plan:  Acute hypoxic respiratory failure due to acute bronchospastic COPD exacerbation -continue usual tx for COPD exac w/ nebs, steroids, and empiric abx - change abx to oral form - begin steroid taper - ambulate and assess O2 stability   Diabetes - newly diagnosed  -A1c 7.4 meeting criteria for true DM - likely secondary to chronic/frequent steroid use for COPD - Novolog sliding scale - attempt to control w/ diet alone, but may need to initiate glucophage prior to d/c - Diabetes educator service ordered - educated patient about following up with PCP (Dr. Jeanie Cooks) for the close management of his diabetes  Essential hypertension -BP stable - continue home meds  Hypokalemia -resolved w/ replacement   DVT prophylaxis: Heparin sq Code Status: Full Family Communication: No family at bedside  Disposition Plan/Expected LOS:  transfer to medical bed - ambulate - possible d/c home in 24hrs   Consultants: None  Procedures: None  Antibiotics: 8/19 Levaquin>>  Objective: Blood pressure 136/85, pulse 78, temperature 97.8 F (36.6 C), temperature source Oral, resp. rate 15, height 5\' 4"  (1.626 m), weight 66.7 kg (147 lb 0.8 oz), SpO2 97.00%.  Intake/Output Summary (Last 24 hours) at 08/29/13 1355 Last data filed at 08/29/13 1055  Gross per 24 hour  Intake   1083 ml  Output   1500 ml  Net   -417 ml   Exam: Gen: no acute resp distress Chest: distant breath sounds throughout all lung fields w/o active wheeze  Cardiac: Regular rate and rhythm, no rubs murmurs or gallops Abdomen: Soft nontender nondistended without obvious hepatosplenomegaly, no ascites, BS+  Extremities: Symmetrical in appearance without cyanosis, clubbing or edema   Scheduled Meds:  Scheduled Meds: . amLODipine  5 mg Oral Daily  . aspirin  81 mg Oral Daily  . atorvastatin  40 mg Oral q morning - 10a  . dextromethorphan-guaiFENesin  1 tablet Oral BID  . ezetimibe  10 mg Oral Daily  . heparin  5,000 Units Subcutaneous 3 times per day  . insulin aspart  0-5 Units Subcutaneous QHS  . insulin aspart  0-9 Units Subcutaneous TID WC  . levofloxacin (LEVAQUIN) IV  500 mg Intravenous Q24H  . methylPREDNISolone (SOLU-MEDROL) injection  60 mg Intravenous Daily  . pantoprazole  40 mg Oral Daily  . potassium chloride SA  20 mEq Oral Daily  . potassium chloride  40  mEq Oral Once  . sodium chloride  3 mL Intravenous Q12H    Data Reviewed: Basic Metabolic Panel:  Recent Labs Lab 08/26/13 1653 08/26/13 1704 08/27/13 0150 08/28/13 0338  NA 142 140 140 140  K 3.3* 3.0* 4.1 4.0  CL 102 102 99 104  CO2 24  --  22 24  GLUCOSE 123* 129* 283* 170*  BUN 15 15 18 23   CREATININE 0.87 0.90 0.83 0.85  CALCIUM 8.7  --  9.0 9.0   Liver Function Tests:  Recent Labs Lab 08/27/13 0150  AST 18  ALT 45  ALKPHOS 74  BILITOT 0.4  PROT 6.4    ALBUMIN 3.2*   CBC:  Recent Labs Lab 08/26/13 1653 08/26/13 1704 08/27/13 0150 08/28/13 0338  WBC 11.9*  --  8.5 21.0*  HGB 15.3 16.7 14.1 13.9  HCT 46.0 49.0 43.1 41.7  MCV 90.7  --  89.8 90.5  PLT 257  --  258 246   Cardiac Enzymes:  Recent Labs Lab 08/26/13 2034 08/27/13 0150 08/27/13 0800  TROPONINI <0.30 <0.30 <0.30   BNP (last 3 results)  Recent Labs  08/01/13 1155 08/26/13 1653  PROBNP 60.5 84.4   CBG:  Recent Labs Lab 08/28/13 1751 08/28/13 2210 08/29/13 0821 08/29/13 1054 08/29/13 1229  GLUCAP 205* 162* 150* 197* 139*    Recent Results (from the past 240 hour(s))  MRSA PCR SCREENING     Status: None   Collection Time    08/26/13  7:56 PM      Result Value Ref Range Status   MRSA by PCR NEGATIVE  NEGATIVE Final   Comment:            The GeneXpert MRSA Assay (FDA     approved for NASAL specimens     only), is one component of a     comprehensive MRSA colonization     surveillance program. It is not     intended to diagnose MRSA     infection nor to guide or     monitor treatment for     MRSA infections.     Studies:  Recent x-ray studies have been reviewed in detail by the Attending Physician  Time spent : 36 mins  Cherene Altes, MD [facility] For Consults/Admissions - Flow Manager - [phone] Office  [phone] Pager [phone]  On-Call/Text Page:      [email or web address]      password [email or web address]   08/29/2013, 1:55 PM   LOS: 3 days

## 2013-08-30 LAB — GLUCOSE, CAPILLARY
Glucose-Capillary: 116 mg/dL — ABNORMAL HIGH (ref 70–99)
Glucose-Capillary: 204 mg/dL — ABNORMAL HIGH (ref 70–99)
Glucose-Capillary: 208 mg/dL — ABNORMAL HIGH (ref 70–99)
Glucose-Capillary: 247 mg/dL — ABNORMAL HIGH (ref 70–99)

## 2013-08-30 MED ORDER — PREDNISONE 20 MG PO TABS
20.0000 mg | ORAL_TABLET | Freq: Every day | ORAL | Status: DC
  Administered 2013-08-31: 20 mg via ORAL
  Filled 2013-08-30: qty 1

## 2013-08-30 NOTE — Progress Notes (Signed)
Ambulated pt in hallway multiple times, sats remained 93-96% on RA.

## 2013-08-30 NOTE — Progress Notes (Signed)
PT Cancellation Note  Patient Details Name: [name] MRN: [id number] DOB: December 20, 1945   Cancelled Treatment:    Reason Eval/Treat Not Completed: Other (comment) Discussed pt with RN, who reports pt had just walked the hallways (see previous RN note)  Will return tomorrow for formal PT eval  Thanks,  Roney Marion, [employer] Pager [phone] Office [phone]    [place] 08/30/2013, 3:57 PM

## 2013-08-30 NOTE — Assessment & Plan Note (Signed)
Stable

## 2013-08-30 NOTE — Assessment & Plan Note (Signed)
Stable coronary artery disease on recent cardiac cath no chest pain reported. Continues with shortness of breath secondary to COPD

## 2013-08-30 NOTE — Assessment & Plan Note (Addendum)
Recent COPD exacerbation with chest pain with stable cardiac cath.  I've asked the patient he uses albuterol inhaler and to use his albute nebulizer as well, he does not have the medication, I asked him to see his primary care today or to go to the emergency room. I refilled his steroids to take 4 tablets for 2 days 3 tablets for 2 days 2 tablets for 2 days and then resume 10 mg daily who called this into the pharmacy.   In no acute distress currently but he definitely needs nebulizers.

## 2013-08-30 NOTE — Progress Notes (Signed)
[name]  Raymel Cull BPZ:[id number] DOB: 04-Jun-1945 DOA: 08/26/2013 PCP: [name], MD  Brief narrative: 68 yo male with PMH of COPD, CAD, HTN, and tobacco abuse (quit 2 years ago), who presented to the ED with worsening SOB and wheezing, despite bronchodilator treatments at home. En route, he was give IV steroids, and breathing treatments with minimal relief.   In the ED, he was started on BIPAP and breathing treatments were continued, with marked improvements in symptoms. When weaned off of the BIPAP, he felt short of breath, had increased work of breathing, wheezing, and was diaphoretic. CXR was unremarkable.   Since admission the pt has slowly improved.  He is now free of BIPAP, and requiring only nasal cannula oxygen.  We will begin to taper steroids, begin to ambulate w/ assist, and determine if home O2 will be necessary.  He is stable enough for transfer to a medical bed.     HPI/Subjective: Reports that he feels better when at rest, but becomes severely SOB and light headed when attempting to ambulate.  Denies n/v, cp, or abdom pain.    Assessment/Plan:  Acute hypoxic respiratory failure due to acute bronchospastic COPD exacerbation -continue usual tx for COPD exac w/ nebs, steroids, and empiric abx - steroid taper - ambulate and assess O2 stability   Diabetes - newly diagnosed  -A1c 7.4 meeting criteria for true DM - likely secondary to chronic/frequent steroid use for COPD - Novolog sliding scale - attempt to control w/ diet alone, but may need to initiate glucophage prior to d/c - Diabetes educator service ordered - educated patient about following up with PCP (Dr. Jeanie Cooks) for the close management of his diabetes  Essential hypertension -BP stable - continue home meds  Hypokalemia -resolved w/ replacement   DVT prophylaxis: Heparin sq Code Status: Full Family Communication: No family at bedside  Disposition Plan/Expected LOS:  transfer to medical bed - ambulate - possible d/c home in 24hrs   Consultants: None  Procedures: None  Antibiotics: 8/19 Levaquin>>  Objective: Blood pressure 135/78, pulse 75, temperature 97.6 F (36.4 C), temperature source Oral, resp. rate 20, height 5\' 4"  (1.626 m), weight 66.7 kg (147 lb 0.8 oz), SpO2 96.00%.  Intake/Output Summary (Last 24 hours) at 08/30/13 1440 Last data filed at 08/29/13 1700  Gross per 24 hour  Intake    240 ml  Output    250 ml  Net    -10 ml   Exam: Gen: no acute resp distress Chest: distant breath sounds throughout all lung fields w/o active wheeze  Cardiac: Regular rate and rhythm, no rubs murmurs or gallops Abdomen: Soft nontender nondistended no ascites, BS+  Extremities: without cyanosis, clubbing or edema   Scheduled Meds:  Scheduled Meds: . aspirin  81 mg Oral Daily  . atorvastatin  40 mg Oral q morning - 10a  . budesonide-formoterol  2 puff Inhalation BID  . dextromethorphan-guaiFENesin  1 tablet Oral BID  . ezetimibe  10 mg Oral Daily  . heparin  5,000 Units Subcutaneous 3 times per day  . insulin aspart  0-5 Units Subcutaneous QHS  . insulin aspart  0-9 Units Subcutaneous TID WC  . levofloxacin  500 mg Oral Daily  . lisinopril  5 mg Oral Daily  . pantoprazole  40 mg Oral Daily  . predniSONE  40 mg Oral Q breakfast  . sodium chloride  3 mL Intravenous Q12H  . tiotropium  18 mcg Inhalation  Daily    Data Reviewed: Basic Metabolic Panel:  Recent Labs Lab 08/26/13 1653 08/26/13 1704 08/27/13 0150 08/28/13 0338  NA 142 140 140 140  K 3.3* 3.0* 4.1 4.0  CL 102 102 99 104  CO2 24  --  22 24  GLUCOSE 123* 129* 283* 170*  BUN 15 15 18 23   CREATININE 0.87 0.90 0.83 0.85  CALCIUM 8.7  --  9.0 9.0   Liver Function Tests:  Recent Labs Lab 08/27/13 0150  AST 18  ALT 45  ALKPHOS 74  BILITOT 0.4  PROT 6.4  ALBUMIN 3.2*   CBC:  Recent Labs Lab 08/26/13 1653 08/26/13 1704 08/27/13 0150 08/28/13 0338  WBC 11.9*   --  8.5 21.0*  HGB 15.3 16.7 14.1 13.9  HCT 46.0 49.0 43.1 41.7  MCV 90.7  --  89.8 90.5  PLT 257  --  258 246   Cardiac Enzymes:  Recent Labs Lab 08/26/13 2034 08/27/13 0150 08/27/13 0800  TROPONINI <0.30 <0.30 <0.30   BNP (last 3 results)  Recent Labs  08/01/13 1155 08/26/13 1653  PROBNP 60.5 84.4   CBG:  Recent Labs Lab 08/29/13 1229 08/29/13 1644 08/29/13 2146 08/30/13 0756 08/30/13 1158  GLUCAP 139* 234* 137* 116* 204*    Recent Results (from the past 240 hour(s))  MRSA PCR SCREENING     Status: None   Collection Time    08/26/13  7:56 PM      Result Value Ref Range Status   MRSA by PCR NEGATIVE  NEGATIVE Final   Comment:            The GeneXpert MRSA Assay (FDA     approved for NASAL specimens     only), is one component of a     comprehensive MRSA colonization     surveillance program. It is not     intended to diagnose MRSA     infection nor to guide or     monitor treatment for     MRSA infections.     Studies:  Recent x-ray studies have been reviewed in detail by the Attending Physician  Time spent : 25 mins  Cherene Altes, MD [facility] For Consults/Admissions - Flow Manager - [phone] Office  [phone] Pager [phone]  On-Call/Text Page:      [email or web address]      password [facility]   08/30/2013, 2:40 PM   LOS: 4 days

## 2013-08-31 LAB — GLUCOSE, CAPILLARY
Glucose-Capillary: 94 mg/dL (ref 70–99)
Glucose-Capillary: 139 mg/dL — ABNORMAL HIGH (ref 70–99)
Glucose-Capillary: 323 mg/dL — ABNORMAL HIGH (ref 70–99)

## 2013-08-31 MED ORDER — LISINOPRIL 5 MG PO TABS
5.0000 mg | ORAL_TABLET | Freq: Every day | ORAL | Status: DC
Start: 2013-08-31 — End: 2013-09-18

## 2013-08-31 MED ORDER — LIVING WELL WITH DIABETES BOOK
Freq: Once | Status: AC
  Administered 2013-08-31: 11:00:00
  Filled 2013-08-31: qty 1

## 2013-08-31 MED ORDER — BLOOD GLUCOSE METER KIT: PACK | Status: DC

## 2013-08-31 MED ORDER — ALBUTEROL SULFATE (2.5 MG/3ML) 0.083% IN NEBU: 2.5000 mg | INHALATION_SOLUTION | Freq: Two times a day (BID) | RESPIRATORY_TRACT | Status: DC

## 2013-08-31 MED ORDER — ALBUTEROL SULFATE (2.5 MG/3ML) 0.083% IN NEBU: 2.0000 mL | INHALATION_SOLUTION | Freq: Four times a day (QID) | RESPIRATORY_TRACT | Status: DC | PRN

## 2013-08-31 NOTE — Progress Notes (Signed)
AVS discharge was given and went over with patient and his family. Ask patient if he wanted to use interpreter over the phone,  he declined and wanted his family/friend member to interpret. Patient was given prescription for lisinopril to take to his pharmacy and he was reminded to pick up his glucose meter and glucose  supplies from his pharmacy. Patient demostrated how to check his blood sugar twice today with minimal prompting before being discharged. Patient was also made aware [facility]. Patient that he did not have any questions. Patient ambulated to his transportation with his family.

## 2013-08-31 NOTE — Plan of Care (Addendum)
Note/chart reviewed.  Katie Jaeveon [place], RD, LDN Pager #: [phone] After-Hours Pager #: [phone]  

## 2013-08-31 NOTE — Care Management Note (Signed)
    Page 1 of 1   08/31/2013     10:38:10 AM CARE MANAGEMENT NOTE 08/31/2013  Patient:  MAVERIK, FOOT   Account Number:  [id number]  Date Initiated:  08/27/2013  Documentation initiated by:  MAYO,HENRIETTA  Subjective/Objective Assessment:   dx COPD exac; lives with spouse    PCP  [name]     Action/Plan:   Anticipated DC Date:     Anticipated DC Plan:        [place]  CM consult      Choice offered to / List presented to:             Status of service:  In process, will continue to follow Medicare Important Message given?  YES (If response is "NO", the following Medicare IM given date fields will be blank) Date Medicare IM given:  08/31/2013 Medicare IM given by:  Magdalen Spatz Date Additional Medicare IM given:   Additional Medicare IM given by:    Discharge Disposition:    Per UR Regulation:  Reviewed for med. necessity/level of care/duration of stay  If discussed at [place] of Stay Meetings, dates discussed:    Comments:

## 2013-08-31 NOTE — Discharge Summary (Signed)
DISCHARGE SUMMARY  [name]  MR#: [id number]  DOB:[birth year]  Date of Admission: 08/26/2013 Date of Discharge: 08/31/2013  Attending Physician:Elesa Garman T  Patient's GMW:NUUVOZD,GUYQI A, MD  Consults:  none   Disposition: d/c home   Follow-up Appts:     Follow-up Information   Follow up with AVBUERE,EDWIN A, MD. Schedule an appointment as soon as possible for a visit in 1 week.   Specialty:  Internal Medicine   Contact information:   [address] [place] [address] [phone]      Tests Needing Follow-up: -Check CBG log to determine if initiation of oral DM medication indicated  -Assure pt is tolerating ACE and that K+ and Crt are stable   Discharge Diagnoses: Acute hypoxic respiratory failure due to acute bronchospastic COPD exacerbation  Diabetes - newly diagnosed  Essential hypertension  Hypokalemia   Initial presentation: 68 yo [place] male with PMH of COPD, CAD, HTN, and tobacco abuse (quit 2 years ago), who presented to the ED with worsening SOB and wheezing, despite bronchodilator treatments at home. En route, he was give IV steroids, and breathing treatments with minimal relief.   In the ED, he was started on BIPAP and breathing treatments were continued, with marked improvements in symptoms. When weaned off of the BIPAP, he felt short of breath, had increased work of breathing, wheezing, and was diaphoretic. CXR was unremarkable.   Hospital Course: Since admission the pt has slowly improved. He was liberated from BIPAP, and eventually weaned all the way down to room air. Ambulatory sats were 90% or greater w/o supplemental O2 prior to d/c.     Acute hypoxic respiratory failure due to acute bronchospastic COPD exacerbation  -corrected with usual tx for COPD exac w/ nebs, steroids, and empiric abx - steroids rapidly tapered to off prior to d/c - ambulated and O2 stable on RA prior to d/c - resumed home inhaler regimen prior to d/c    Diabetes - newly diagnosed  -A1c 7.4 meeting criteria for true DM - likely secondary to chronic/frequent steroid use for COPD - Novolog sliding scale utilized during admission - will attempt to control w/ diet alone after d/c, but may need to initiate glucophage in outpt setting if CBGs remain above goal - Diabetes educator spoke w/ pt at length - Rx for CBG meter provided - educated patient about following up with PCP (Dr. Jeanie Cooks) for the close management of his diabetes and to record his CBG values at least daily  - norvasc stopped and ACE initiated - pt already on statin and ASA  Essential hypertension  -BP stable - continue home meds   Hypokalemia  -resolved w/ replacement     Medication List    STOP taking these medications       amLODipine 5 MG tablet  Commonly known as:  NORVASC     potassium chloride SA 20 MEQ tablet  Commonly known as:  K-DUR,KLOR-CON     predniSONE 10 MG tablet  Commonly known as:  DELTASONE      TAKE these medications       albuterol 108 (90 BASE) MCG/ACT inhaler  Commonly known as:  PROVENTIL HFA;VENTOLIN HFA  Inhale 2 puffs into the lungs every 6 (six) hours as needed for wheezing or shortness of breath.     albuterol (2.5 MG/3ML) 0.083% nebulizer solution  Commonly known as:  PROVENTIL  Take 3 mLs (2.5 mg total) by nebulization 2 (two) times daily. And as needed. Dx 496,  FILE MCR PART B  aspirin 81 MG tablet  Take 81 mg by mouth daily.     atorvastatin 40 MG tablet  Commonly known as:  LIPITOR  Take 40 mg by mouth every morning.     blood glucose meter kit and supplies  Dispense based on patient and insurance preference. Use up to four times daily as directed. (FOR ICD-9 250.00, 250.01).     budesonide-formoterol 160-4.5 MCG/ACT inhaler  Commonly known as:  SYMBICORT  Inhale 2 puffs into the lungs 2 (two) times daily.     ezetimibe 10 MG tablet  Commonly known as:  ZETIA  Take 1 tablet (10 mg total) by mouth daily.      lisinopril 5 MG tablet  Commonly known as:  PRINIVIL,ZESTRIL  Take 1 tablet (5 mg total) by mouth daily.     nitroGLYCERIN 0.4 MG SL tablet  Commonly known as:  NITROSTAT  Place 1 tablet (0.4 mg total) under the tongue every 5 (five) minutes as needed for chest pain.     omeprazole 20 MG capsule  Commonly known as:  PRILOSEC  Take 1 capsule (20 mg total) by mouth 2 (two) times daily before a meal.     SPIRIVA HANDIHALER 18 MCG inhalation capsule  Generic drug:  tiotropium  Place 18 mcg into inhaler and inhale daily.       Day of Discharge BP 126/79  Pulse 86  Temp(Src) 98.1 F (36.7 C) (Oral)  Resp 18  Ht 5' 4"  (1.626 m)  Wt 66.7 kg (147 lb 0.8 oz)  BMI 25.23 kg/m2  SpO2 98%  Physical Exam: General: No acute respiratory distress - ambulating in room w/o dyspnea  Lungs: Clear to auscultation bilaterally without wheezes or crackles Cardiovascular: Regular rate and rhythm without murmur gallop or rub normal S1 and S2 Abdomen: Nontender, nondistended, soft, bowel sounds positive, no rebound, no ascites, no appreciable mass Extremities: No significant cyanosis, clubbing, or edema bilateral lower extremities  Results for orders placed during the hospital encounter of 08/26/13 (from the past 24 hour(s))  GLUCOSE, CAPILLARY     Status: Abnormal   Collection Time    08/30/13  5:48 PM      Result Value Ref Range   Glucose-Capillary 208 (*) 70 - 99 mg/dL   Comment 1 Notify RN    GLUCOSE, CAPILLARY     Status: Abnormal   Collection Time    08/30/13 10:12 PM      Result Value Ref Range   Glucose-Capillary 247 (*) 70 - 99 mg/dL   Comment 1 Notify RN     Comment 2 Documented in Chart    GLUCOSE, CAPILLARY     Status: None   Collection Time    08/31/13  7:21 AM      Result Value Ref Range   Glucose-Capillary 94  70 - 99 mg/dL  GLUCOSE, CAPILLARY     Status: Abnormal   Collection Time    08/31/13 11:37 AM      Result Value Ref Range   Glucose-Capillary 139 (*) 70 - 99 mg/dL      Time spent in discharge (includes decision making & examination of pt): >30 minutes  08/31/2013, 3:17 PM   Cherene Altes, MD [facility] Office  [phone] Pager [phone]  On-Call/Text Page:      [email or web address]      password [facility]

## 2013-08-31 NOTE — Plan of Care (Signed)
Problem: Discharge Progression Outcomes Goal: Dyspnea controlled Outcome: Adequate for Discharge No dyspnea at rest or with ambulation.

## 2013-08-31 NOTE — Progress Notes (Signed)
Inpatient Diabetes Program Recommendations  AACE/ADA: New Consensus Statement on Inpatient Glycemic Control (2013)  Target Ranges:  Prepandial:   less than 140 mg/dL      Peak postprandial:   less than 180 mg/dL (1-2 hours)      Critically ill patients:  140 - 180 mg/dL     Results for [name], [name] (MRN [id number]) as of 08/31/2013 10:25  Ref. Range 08/30/2013 07:56 08/30/2013 11:58 08/30/2013 17:48 08/30/2013 22:12  Glucose-Capillary Latest Range: 70-99 mg/dL 116 (H) 204 (H) 208 (H) 247 (H)    Results for [name], [name] (MRN [id number]) as of 08/31/2013 10:25  Ref. Range 08/27/2013 09:10  Hemoglobin A1C Latest Range: <5.7 % 7.4 (H)     Admitted with COPD flare.  New diagnosis of DM made this admission based on A1c of 7.4%.  Attempted to speak with pt about new diagnosis.  Discussed A1C results with him and explained what an A1C is, basic pathophysiology of DM Type 2, basic home care, importance of checking CBGs and maintaining good CBG control to prevent long-term and short-term complications.  Encouraged patient to check his CBGs at least once per day and to vary the time of day he checks and to record all CBGs in a book for Dr. Jeanie Cooks.   Conversation with patient limited in that patient's first language is [place] and his English is very limited.  Patient understood some of our conversation however he told me that he really wants the MD and the RN to review his list of medications with him before d/c so he knows when and what to take at what time.  RNs to provide ongoing basic DM education at bedside with this patient.  Have ordered educational booklet, RD consult, and DM videos.  Patient told me he will watch DM videos as best he can even though he may not understand all the information in [name].  Have also asked RN caring for patient to please allow patient to practice checking fingerstick sugars at least once or twice before d/c.  Reviewed some basic dietary information with patient.   Instructed patient to avoid sweet tea and regular sodas and fruit juice.  Patient stated he drinks mostly water and tea with sugar.  Encouraged patient to drink tea without sugar.  Also encouraged patient to eat smaller portions of rice and larger portions of vegetables.  Will place Outpatient DM education referral to the [place] and DM management center.  Will have OP center arrange for [employer].    MD- Please make sure to give patient a Rx for a CBG meter at time of discharge.  Use Order # [id number].  May want to consider starting Metformin 500 mg bid at time of d/c home     Will follow Wyn Quaker RN, MSN, CDE Diabetes Coordinator Inpatient Diabetes Program Team Pager: [phone] (8a-10p)

## 2013-08-31 NOTE — Progress Notes (Signed)
OT Cancellation Note  Patient Details Name: Sion Reinders MRN: [id number] DOB: 21-Apr-1945   Cancelled Treatment:    Reason Eval/Treat Not Completed: OT screened, no needs identified, will sign off. Pt independent with PT 500 feet earlier today. In to speak with patient and he does not report any issues with BADLs. Pt up and moving around his room independently.   Almon Register [phone] 08/31/2013, 3:58 PM

## 2013-08-31 NOTE — Discharge Instructions (Signed)
B?nh ti?u ???ng típ 2 °(Type 2 Diabetes Mellitus) °B?nh ti?u ???ng típ 2, th??ng g?i ??n gi?n là ti?u ???ng típ 2, là m?t b?nh kéo dài (m?n tính). Trong ti?u ???ng típ 2, tuy?n t?y không s?n xu?t ?? insulin (hocmon), các t? bào ít ?áp ?ng v?i insulin làm cho ( kháng insulin), ho?c c? hai. Thông th??ng, insulin v?n chuy?n ???ng t? th?c ?n vào các t? bào ? mô. Các t? bào ? mô s? d?ng ???ng ?? s?n sinh ra n?ng l??ng. Thi?u h?t insulin ho?c không ?áp ?ng bình th??ng v?i insulin gây ra l??ng ???ng d? th?a tích t? trong máu thay vì ?i vào các t? bào ? mô. K?t qu? là, làm cho l??ng ???ng trong máu cao (t?ng ???ng huy?t). ?nh h??ng c?a hàm l??ng ???ng (glucose) cao có th? gây ra nhi?u bi?n ch?ng.  °B?nh ti?u ???ng típ 2 tr??c ?ây còn ???c g?i là b?nh ti?u ???ng kh?i phát ? ng??i l?n, nh?ng nó có th? x?y ra ? b?t c? l?a tu?i nào.  °CÁC Y?U T? NGUY C?  °M?t ng??i d? b? b?nh ti?u ???ng típ 2 n?u có ai ?ó trong gia ?ình b? b?nh này, ??ng th?i có m?t ho?c nhi?u y?u t? nguy c? chính sau ?ây: °· Th?a cân. °· L?i s?ng ít ho?t ??ng. °· Ti?n s? liên t?c ?n th?c ?n nhi?u n?ng l??ng. °Duy trì cân n?ng bình th??ng và ho?t ??ng thân th? th??ng xuyên có th? làm gi?m nguy c? phát tri?n b?nh ti?u ???ng típ 2. °TRI?U CH?NG  °Ban ??u, m?t ng??i b? b?nh ti?u ???ng típ 2 có th? không có các tri?u ch?ng. Các tri?u ch?ng c?a b?nh ti?u ???ng típ 2 xu?t hi?n t? t?. Các tri?u ch?ng bao g?m: °· Khát n??c nhi?u (ch?ng khát nhi?u). °· Ti?u ti?n nhi?u (?a ni?u). °· ?i ti?u nhi?u vào ban ?êm (ti?u ?êm). °· S?t cân. Hi?n t??ng gi?m cân này có th? r?t nhanh. °· Th??ng xuyên b? nhi?m trùng tái phát. °· M?t m?i (m?t) °· Y?u. °· Thay ??i th? l?c, ch?ng h?n nh? nhìn m?. °· Mùi trái cây trong h?i th? c?a quý v?. °· ?au b?ng. °· Bu?n nôn ho?c nôn m?a. °· V?t c?t ho?c v?t b?m tím lâu lành. °· ?au bu?t ho?c tê ? bàn tay ho?c bàn chân. °CH?N ?OÁN °B?nh ti?u ???ng típ 2 th??ng không ???c ch?n ?oán cho ??n khi xu?t hi?n các bi?n ch?ng c?a b?nh ti?u ???ng. B?nh ti?u  ???ng típ 2 ???c ch?n ?oán khi xu?t hi?n các tri?u ch?ng c?ng nh? bi?n ch?ng và khi l??ng ???ng huy?t t?ng. L??ng ???ng huy?t có th? ???c ki?m tra b?ng m?t ho?c nhi?u xét nghi?m máu sau ?ây: °· Xét nghi?m ???ng huy?t lúc ?ói. Quý v? s? không ???c phép ?n trong ít nh?t là 8 ti?ng tr??c khi l?y m?u máu. °· Xét nghi?m ???ng huy?t ng?u nhiên. ???ng huy?t ???c xét nghi?m b?t k? lúc nào trong ngày, b?t k? quý v? ?n lúc nào. °· Xét nghi?m ???ng huy?t A1c hemoglobin. Xét nghi?m A1c hemoglobin cung c?p thông tin v? vi?c ki?m soát ???ng huy?t trong 3 tháng tr??c ?ó. °· Xét nghi?m dung n?p glucose theo ???ng u?ng (OGTT). ???ng huy?t c?a quý v? ???c ?o sau khi quý v? ch?a ?n (nh?n ?n) trong 2 gi? và sau ?ó là sau khi quý v? u?ng ?? u?ng có ch?a glucose. °?I?U TR?  °· Quý v? có th? c?n dùng insulin ho?c thu?c tr? ti?u ???ng hàng ngày ??   gi? cho l??ng ???ng huy?t trong ph?m vi mong mu?n. °· N?u quý v? dùng insulin, quý v? có th? c?n ?i?u ch?nh li?u thu?c tùy thu?c vào l??ng carbohydrate mà quý v? ?n trong m?i b?a ?n chính ho?c b?a ?n nh?. °M?c tiêu ?i?u tr? là ?? duy trì l??ng ???ng trong máu tr??c b?a ?n (glucose tr??c ?n) ? m?c 70-130 mg/dL. °H??NG D?N CH?M SÓC T?I NHÀ  °· L??ng A1c hemoglobin c?a quý v? ???c ki?m tra hai l?n m?i n?m. °· Th?c hi?n vi?c theo dõi ???ng huy?t hàng ngày theo ch? d?n c?a chuyên gia ch?m sóc s?c kh?e. °· Theo dõi k[place] trong n??c ti?u khi quý v? b? b?nh và theo ch? d?n c?a chuyên gia ch?m sóc s?c kh?e. °· S? d?ng thu?c tr? ti?u ???ng ho?c insulin theo ch? d?n c?a chuyên gia ch?m sóc s?c kh?e ?? duy trì l??ng ???ng huy?t trong ph?m vi mong mu?n. °¨ Không bao gi? ?? h?t thu?c tr? ti?u ???ng ho?c insulin. Thu?c c?n ph?i dùng hàng ngày. °¨ N?u quý v? ?ang dùng insulin, quý v? có th? ?i?u ch?nh l??ng insulin d?a vào l??ng carbohydrates quý v? ?n. Carbohydrate có th? làm t?ng l??ng ???ng huy?t nh?ng c?n ph?i bao g?m trong ch? ?? ?n u?ng c?a quý v?. Carbohydrate cung c?p vitamin, khoáng ch?t và ch?t x?, là  m?t ph?n thi?t y?u c?a ch? ?? ?n u?ng có l?i cho s?c kh?e. Carbohydrate ???c tìm th?y trong trái cây, rau, ng? c?c, các s?n ph?m t? s?a, các lo?i ??u và các lo?i th?c ph?m có b? sung thêm ???ng. °· ?n th?c ?n có l?i cho s?c kh?e. Quý v? c?n h?n g?p m?t chuyên gia dinh d??ng có ??ng ký hành ngh? ?? giúp quý v? ??a ra m?t k? ho?ch ?n u?ng phù h?p. °· Gi?m cân n?u quý v? th?a cân. °· Mang theo th? c?nh báo y t? ho?c ?eo ?? trang s?c có c?nh báo y t?. °· Mang theo ?? ?n nh? ch?a 15 gam carbohydrate m?i lúc ?? ?i?u tr? h? ???ng huy?t (h? ???ng huy?t). M?t s? ví d? v? ?? ?n nh? ch?a 15 gam carbohydrate bao g?m: °¨ Viên glucose, 3 ho?c 4. °¨ Gel glucose, ?ng 15 gam. °¨ Nho khô, 2 mu?ng (24 gam). °¨ Th?ch hình h?t ??u, 6. °¨ Bánh quy hình con gi?ng, 8. °¨ N??c u?ng có ga thông th??ng, 4 aox? (120 ml) °¨ K?o chíp chíp, 9. °· Nh?n bi?t h? ???ng huy?t. H? ???ng huy?t x?y ra khi l??ng ???ng huy?t t? 70 mg/dL tr? xu?ng. Nguy c? h? ???ng huy?t gia t?ng khi nh?n ?n ho?c b? b?a, trong và sau khi t?p th? d?c c??ng ?? cao và trong khi ng?. Các tri?u ch?ng h? ???ng huy?t có th? bao g?m: °¨ Run ho?c l?c. °¨ Gi?m kh? n?ng t?p trung. °¨ ?? m? hôi. °¨ Nh?p tim t?ng. °¨ ?au ??u. °¨ Khô mi?ng. °¨ ?ói. °¨ D? b? kích thích. °¨ Lo âu. °¨ Ng? không yên. °¨ Thay ??i l?i nói ho?c s? ph?i h?p. °¨ B? lú l?n. °· ?i?u tr? h? ???ng huy?t k?p th?i. N?u quý v? t?nh táo và có th? nu?t m?t cách an toàn, hãy theo quy t?c 15:15: °¨ Dùng 15-20 gam glucose ho?c carbohydrate có tác d?ng nhanh. L?a ch?n tác ??ng nhanh bao g?m gel glucose, viên glucose ho?c 4 aox? (120 ml) n??c ép trái cây, soda bình th??ng ho?c s?a ít béo. °¨ Ki?m tra l??ng ???ng huy?t c?a quý v?   15 phút sau khi u?ng glucose. °¨ Dùng t? 15-20 gam glucose tr? lên n?u l??ng ???ng huy?t ???c ?o l?i v?n ? m?c 70 mg/dL tr? xu?ng. °¨ ?n theo b?a ?n bình th??ng ho?c ?? ?n nh? trong vòng 1 ti?ng sau khi l??ng ???ng huy?t tr? l?i bình th??ng. °· Hãy c?nh giác v?i c?m giác r?t khát và ?i ti?u ti?n nhi?u  l?n h?n bình th??ng vì ?ây là nh?ng d?u hi?u s?m c?a t?ng ???ng huy?t. Vi?c phát hi?n t?ng ???ng huy?t s?m cho phép ?i?u tr? k?p th?i. ?i?u tr? t?ng ???ng huy?t theo ch? d?n c?a chuyên gia ch?m sóc s?c kh?e. °· M?i tu?n tham gia vào ít nh?t 150 phút ho?t ??ng thân th? v?i c??ng ?? trung bình, phân b? trong ít nh?t 3 ngày trong tu?n ho?c theo ch? d?n c?a chuyên gia ch?m sóc s?c kh?e. Ngoài ra, quý v? nên tham gia vào bài t?p có s?c c?n ít nh?t 2 l?n m?t tu?n ho?c theo ch? d?n c?a chuyên gia ch?m sóc s?c kh?e. C? g?ng dành không quá 90 phút m?i l?n không ho?t ??ng. °· ?i?u ch?nh thu?c và l??ng th?c ?n khi c?n n?u quý v? b?t ??u m?t bài t?p ho?c m?t môn th? thao m?i. °· Làm theo k? ho?ch trong ngày b? b?nh c?a quý v? b?t c? lúc nào mà quý v? không th? ?n ho?c u?ng nh? bình th??ng. °· Không s? d?ng các s?n ph?m thu?c lá bao g?m thu?c lá hút, thu?c lá d?ng nhai ho?c thu?c lá ?i?n t?. N?u quý v? c?n giúp ?? ?? cai thu?c, hãy h?i chuyên gia ch?m sóc s?c kh?e. °· Gi?i h?n l??ng r??u quý v? u?ng không quá 1 ly m?i ngày v?i ph? n? không mang thai và 2 ly m?i ngày v?i nam gi?i. Quý v? ch? nên u?ng r??u khi ?n. Nói chuy?n v?i chuyên gia ch?m sóc s?c kh?e xem u?ng r??u có an toàn cho quý v? hay không. Cho chuyên gia ch?m sóc s?c kh?e bi?t n?u quý v? u?ng r??u vài l?n m?i tu?n. °· Tuân th? m?i cu?c h?n khám l?i theo ch? d?n c?a chuyên gia ch?m sóc s?c kh?e. ?i?u này là quan tr?ng. °· S?p x?p bu?i khám m?t ngay sau khi ch?n ?oán b?nh ti?u ???ng típ 2 và sau ?ó là hàng n?m. °· Th?c hi?n ch?m sóc da và bàn chân hàng ngày. Ki?m tra da và bàn chân hàng ngày xem có v?t c?t, v?t b?m tím, t?y ??, v?n ?? v? móng, ch?y máu, m?n n??c hay l? loét không. Bàn chân c?n ???c chuyên gia ch?m sóc s?c kh?e khám hàng n?m. °· ?ánh r?ng và l?i ít nh?t hai l?n m?i ngày và dùng ch? nha khoa ít nh?t m?t l?n m?i ngày. G?p nha s? ?? khám l?i th??ng xuyên. °· Chia s? k? ho?ch qu?n lý b?nh ti?u ???ng c?a quý v? ? n?i làm vi?c ho?c tr??ng h?c c?a quý  v?. °· Luôn c?p nh?t vi?c tiêm ch?ng. Ng??i b? b?nh ti?u ???ng trên 65 tu?i ???c khuy?n ngh? tiêm v?cxin viêm ph?i. Trong m?t s? tr??ng h?p, quý v? có th? ???c tiêm hai m?i khác nhau. H?i chuyên gia ch?m sóc s?c kh?e xem vi?c tiêm phòng viêm ph?i c?a quý v? có c?p nh?t không. °· H?c cách qu?n lý c?ng th?ng. °· Xin ???c giáo d?c và h? tr? v? b?nh ti?u ???ng th??ng xuyên khi c?n. °· Tham gia ho?c tìm cách ph?c h?i ch?c n?ng khi c?n thi?t ??   duy tr ho?c c?i thi?n kh? n?ng ??c l?p v ch?t l??ng cu?c s?ng. Yu c?u chuy?n sang v?t l tr? li?u ho?c li?u php ngh? nghi?p n?u qu v? b? t bn chn ho?c t tay, ho?c kh ch?i ??u, kh m?c qu?n o, ?n u?ng ho?c ho?t ??ng th? ch?t. ?I KHM N?U:   Qu v? khng th? ?n ho?c u?ng trong h?n 6 ti?ng.  Qu v? b? bu?n nn v nn m?a trong h?n 6 ti?ng.  L??ng ???ng huy?t c?a qu v? cao trn 240 mg/dL.  C thay ??i tr?ng thi tinh th?n.  Qu v? b? thm m?t c?n b?nh nghim tr?ng.  Qu v? b? tiu ch?y trong h?n 6 ti?ng.  Qu v? ? b? ?m ho?c b? s?t trong m?t vi ngy v khng ?? h?n.  Qu v? b? ?au trong khi tham gia b?t k? ho?t ??ng thn th? no. NGAY L?P T?C ?I KHM N?U:  Qu v? b? kh th?.  Qu v? c l??ng ketone ? m?c trung bnh ??n cao. ??M B?O QU V?:  Hi?u r cc h??ng d?n ny.  S? theo di tnh tr?ng c?a mnh.  S? yu c?u tr? gip ngay l?p t?c n?u qu v? c?m th?y khng kh?e ho?c th?y tr?m tr?ng h?n. Document Released: 12/25/2004 Document Revised: 05/11/2013 [facility] Patient Information 2015 [employer], [place]. This information is not intended to replace advice given to you by your health care provider. Make sure you discuss any questions you have with your health care provider.  Type 2 Diabetes Mellitus Type 2 diabetes mellitus, often simply referred to as type 2 diabetes, is a long-lasting (chronic) disease. In type 2 diabetes, the pancreas does not make enough insulin (a hormone), the cells are less responsive to the insulin that is made (insulin  resistance), or both. Normally, insulin moves sugars from food into the tissue cells. The tissue cells use the sugars for energy. The lack of insulin or the lack of normal response to insulin causes excess sugars to build up in the blood instead of going into the tissue cells. As a result, high blood sugar (hyperglycemia) develops. The effect of high sugar (glucose) levels can cause many complications. Type 2 diabetes was also previously called adult-onset diabetes, but it can occur at any age.  RISK FACTORS  A person is predisposed to developing type 2 diabetes if someone in the family has the disease and also has one or more of the following primary risk factors:  Overweight.  An inactive lifestyle.  A history of consistently eating high-calorie foods. Maintaining a normal weight and regular physical activity can reduce the chance of developing type 2 diabetes. SYMPTOMS  A person with type 2 diabetes may not show symptoms initially. The symptoms of type 2 diabetes appear slowly. The symptoms include:  Increased thirst (polydipsia).  Increased urination (polyuria).  Increased urination during the night (nocturia).  Weight loss. This weight loss may be rapid.  Frequent, recurring infections.  Tiredness (fatigue).  Weakness.  Vision changes, such as blurred vision.  Fruity smell to your breath.  Abdominal pain.  Nausea or vomiting.  Cuts or bruises which are slow to heal.  Tingling or numbness in the hands or feet. DIAGNOSIS Type 2 diabetes is frequently not diagnosed until complications of diabetes are present. Type 2 diabetes is diagnosed when symptoms or complications are present and when blood glucose levels are increased. Your blood glucose level may be checked by one or more of the following blood tests:  A fasting  blood glucose test. You will not be allowed to eat for at least 8 hours before a blood sample is taken.  A random blood glucose test. Your blood glucose  is checked at any time of the day regardless of when you ate.  A hemoglobin A1c blood glucose test. A hemoglobin A1c test provides information about blood glucose control over the previous 3 months.  An oral glucose tolerance test (OGTT). Your blood glucose is measured after you have not eaten (fasted) for 2 hours and then after you drink a glucose-containing beverage. TREATMENT   You may need to take insulin or diabetes medicine daily to keep blood glucose levels in the desired range.  If you use insulin, you may need to adjust the dosage depending on the carbohydrates that you eat with each meal or snack. The treatment goal is to maintain the before meal blood sugar (preprandial glucose) level at 70-130 mg/dL. HOME CARE INSTRUCTIONS   Have your hemoglobin A1c level checked twice a year.  Perform daily blood glucose monitoring as directed by your health care provider.  Monitor urine ketones when you are ill and as directed by your health care provider.  Take your diabetes medicine or insulin as directed by your health care provider to maintain your blood glucose levels in the desired range.  Never run out of diabetes medicine or insulin. It is needed every day.  If you are using insulin, you may need to adjust the amount of insulin given based on your intake of carbohydrates. Carbohydrates can raise blood glucose levels but need to be included in your diet. Carbohydrates provide vitamins, minerals, and fiber which are an essential part of a healthy diet. Carbohydrates are found in fruits, vegetables, whole grains, dairy products, legumes, and foods containing added sugars.  Eat healthy foods. You should make an appointment to see a registered dietitian to help you create an eating plan that is right for you.  Lose weight if you are overweight.  Carry a medical alert card or wear your medical alert jewelry.  Carry a 15-gram carbohydrate snack with you at all times to treat low blood  glucose (hypoglycemia). Some examples of 15-gram carbohydrate snacks include:  Glucose tablets, 3 or 4.  Glucose gel, 15-gram tube.  Raisins, 2 tablespoons (24 grams).  Jelly beans, 6.  Animal crackers, 8.  Regular pop, 4 ounces (120 mL).  Gummy treats, 9.  Recognize hypoglycemia. Hypoglycemia occurs with blood glucose levels of 70 mg/dL and below. The risk for hypoglycemia increases when fasting or skipping meals, during or after intense exercise, and during sleep. Hypoglycemia symptoms can include:  Tremors or shakes.  Decreased ability to concentrate.  Sweating.  Increased heart rate.  Headache.  Dry mouth.  Hunger.  Irritability.  Anxiety.  Restless sleep.  Altered speech or coordination.  Confusion.  Treat hypoglycemia promptly. If you are alert and able to safely swallow, follow the 15:15 rule:  Take 15-20 grams of rapid-acting glucose or carbohydrate. Rapid-acting options include glucose gel, glucose tablets, or 4 ounces (120 mL) of fruit juice, regular soda, or low-fat milk.  Check your blood glucose level 15 minutes after taking the glucose.  Take 15-20 grams more of glucose if the repeat blood glucose level is still 70 mg/dL or below.  Eat a meal or snack within 1 hour once blood glucose levels return to normal.  Be alert to feeling very thirsty and urinating more frequently than usual, which are early signs of hyperglycemia. An early awareness of  hyperglycemia allows for prompt treatment. Treat hyperglycemia as directed by your health care provider.  Engage in at least 150 minutes of moderate-intensity physical activity a week, spread over at least 3 days of the week or as directed by your health care provider. In addition, you should engage in resistance exercise at least 2 times a week or as directed by your health care provider. Try to spend no more than 90 minutes at one time inactive.  Adjust your medicine and food intake as needed if you  start a new exercise or sport.  Follow your sick-day plan anytime you are unable to eat or drink as usual.  Do not use any tobacco products including cigarettes, chewing tobacco, or electronic cigarettes. If you need help quitting, ask your health care provider.  Limit alcohol intake to no more than 1 drink per day for nonpregnant women and 2 drinks per day for men. You should drink alcohol only when you are also eating food. Talk with your health care provider whether alcohol is safe for you. Tell your health care provider if you drink alcohol several times a week.  Keep all follow-up visits as directed by your health care provider. This is important.  Schedule an eye exam soon after the diagnosis of type 2 diabetes and then annually.  Perform daily skin and foot care. Examine your skin and feet daily for cuts, bruises, redness, nail problems, bleeding, blisters, or sores. A foot exam by a health care provider should be done annually.  Brush your teeth and gums at least twice a day and floss at least once a day. Follow up with your dentist regularly.  Share your diabetes management plan with your workplace or school.  Stay up-to-date with immunizations. It is recommended that people with diabetes who are over 105 years old get the pneumonia vaccine. In some cases, two separate shots may be given. Ask your health care provider if your pneumonia vaccination is up-to-date.  Learn to manage stress.  Obtain ongoing diabetes education and support as needed.  Participate in or seek rehabilitation as needed to maintain or improve independence and quality of life. Request a physical or occupational therapy referral if you are having foot or hand numbness, or difficulties with grooming, dressing, eating, or physical activity. SEEK MEDICAL CARE IF:   You are unable to eat food or drink fluids for more than 6 hours.  You have nausea and vomiting for more than 6 hours.  Your blood glucose level is  over 240 mg/dL.  There is a change in mental status.  You develop an additional serious illness.  You have diarrhea for more than 6 hours.  You have been sick or have had a fever for a couple of days and are not getting better.  You have pain during any physical activity.  SEEK IMMEDIATE MEDICAL CARE IF:  You have difficulty breathing.  You have moderate to large ketone levels. MAKE SURE YOU:  Understand these instructions.  Will watch your condition.  Will get help right away if you are not doing well or get worse. Document Released: 12/25/2004 Document Revised: 05/11/2013 Document Reviewed: 07/24/2011 [facility] Patient Information 2015 [place], [place]. This information is not intended to replace advice given to you by your health care provider. Make sure you discuss any questions you have with your health care provider.   B?nh ph?i t?c ngh?n m?n tnh (Chronic Obstructive Pulmonary Disease) B?nh ph?i t?c ngh?n m?n tnh (COPD) l tnh tr?ng ph? bi?n ? ph?i,  trong ?, dng kh t? ph?i ra b? h?n ch?. COPD l m?t thu?t ng? chung c th? s? d?ng ?? m t? nh?ng v?n ?? khc nhau ? ph?i lm h?n ch? dng kh, k? c? vim ph? qu?n kinh nin v b?nh kh ph? th?ng. N?u qu v? b? COPD, ch?c n?ng ph?i c?a qu v? c th? s? khng bao gi? tr? l?i bnh th??ng, nh?ng c nh?ng bi?n php qu v? c th? th?c hi?n ?? c?i thi?n ch?c n?ng ph?i v lm qu v? c?m th?y kh?e h?n.  NGUYN NHN   Ht thu?c (ph? bi?n).  Ti?p xc v?i khi thu?c l th? ??ng.  Nh?ng v?n ?? v? di truy?n.  B?nh vim ph?i m?n tnh ho?c nhi?m trng l?p ?i l?p l?i. TRI?U CH?NG   Kh th?, ??c bi?t khi tham gia vo ho?t ??ng th? ch?t.  Ho su, ko di (m?n tnh) v?i m?t l??ng l?n d?ch nh?y ??c.  Th? kh kh.  Th? nhanh (nh?p th? nhanh).  Da ??i mu xm ho?c h?i xanh (xanh tm), ??c bi?t ? cc ngn tay, ngn chn ho?c mi.  M?t m?i.  S?t cn.  Nhi?m trng th??ng xuyn ho?c cc giai ?o?n m cc tri?u ch?ng v? th? tr? nn  t?i t? h?n (b?nh tr?m tr?ng h?n).  T?c ng?c. CH?N ?ON  Chuyn gia ch?m [place] s?c kh?e c?a qu v? s? khai thc b?nh s? v ti?n hnh khm th?c th? ?? c ch?n ?on ban ??u. Cc ki?m tra b? sung v? COPD c th? bao g?m:   Ki?m tra ch?c n?ng ph?i (ph?i).  Ch?p X quang ng?c.  Ch?p CT.  Xt nghi?m mu. ?I?U TR?  Cc bi?n php ?i?u tr? s?n c gip qu v? c?m th?y kh?e h?n khi b? COPD bao g?m:   Thu?c dng qua ?ng x?t ho?c my kh dung. Nh?ng lo?i thu?c ny gip x? l cc tri?u ch?ng c?a COPD v lm qu v? th? d? dng h?n.  B? sung thm  xy. B? sung thm  xy ch? c tc d?ng n?u qu v? c n?ng ?? -xy trong mu th?p.  T?p th? d?c v ho?t ??ng th? ch?t. Nh?ng bi?n php ny h?u ch cho t?t c? nh?ng ng??i b? COPD. M?t s? ng??i c th? h??ng l?i t? ch??ng trnh ph?c h?i ch?c n?ng h h?p. H??NG D?N CH?M [place] T?I NH   S? d?ng t?t c? cc lo?i thu?c (d?ng ht ho?c d?ng vin) theo ch? d?n c?a chuyn gia ch?m [place] s?c kh?e.  Hessie Diener s? d?ng thu?c ho?c xi-r ho khng c?n k ??n lm kh ???ng h h?p c?a qu v? (ch?ng h?n thu?c khng histamin) v lm ch?m qu trnh lo?i b? cc ch?t ti?t, tr? khi chuyn gia ch?m [place] s?c kh?e h??ng d?n khc.  N?u qu v? l ng??i ht thu?c, ?i?u quan tr?ng nh?t m qu v? c th? lm l b? ht thu?c. Ti?p t?c ht thu?c s? lm t?n th??ng ph?i thm v gy ra cc v?n ?? v? h h?p. ?? ngh? chuyn gia ch?m [place] s?c kh?e gip ?? ?? b? ht thu?c. Chuyn gia ch?m  s?c kh?e c th? h??ng d?n qu v? s? d?ng cc ngu?n l?c trong c?ng ??ng ho?c cc b?nh vi?n c h? tr? cai thu?c.  Trnh ti?p xc v?i cc ch?t kch thch, ch?ng h?n khi thu?c, ha ch?t v khi lm tr?m tr?ng thm v?n ?? th? c?a qu v?.  S? d?ng li?u php -xy v h?i ph?c ch?c n?ng h  h?p n?u ???c chuyn gia ch?m [place] s?c kh?e ch? d?n. N?u qu v? c?n li?u php -xy t?i nh, hy h?i chuyn gia ch?m [place] s?c kh?e xem li?u qu v? c c?n mua my ?o ?? bo ha -xy trong mu ?? ?o n?ng ?? -xy ? nh hay khng.  Trnh ti?p xc v?i nh?ng  ng??i c b?nh truy?n nhi?m.  Hessie Diener thay ??i nhi?t ?? v ?? ?m qu m?c.  ?n th?c ?n c l?i cho s?c kh?e. ?n nhi?u b?a nh? h?n, th??ng xuyn h?n v ngh? ng?i tr??c khi ?n c th? gip qu v? duy tr s?c b?n c?a mnh.  Ti?p t?c ho?t ??ng tch c?c, nh?ng hy cn b?ng ho?t ??ng v?i th?i gian ngh? ng?i. T?p th? d?c v ho?t ??ng th? ch?t s? gip qu v? duy tr kh? n?ng lm nh?ng ?i?u qu v? mu?n lm.  Trnh nhi?m trng v nh?p vi?n l vi?c r?t quan tr?ng khi qu v? b? COPD. B?o ??m vi?c s? d?ng t?t c? cc lo?i v?c-xin m chuyn gia ch?m [place] s?c kh?e khuy?n ngh?, ??c bi?t l v?c-xin ph? c?u khu?n v v?c xin cm. Hy h?i chuyn gia ch?m [place] s?c kh?e xem qu v? c c?n dng v?c-xin vim ph?i hay khng.  Tm hi?u v s? d?ng cc k? thu?t th? gin ?? x? l c?ng th?ng.  Tm hi?u v s? d?ng cc k? thu?t th? c ki?m sot theo ch? d?n c?a chuyn gia ch?m [place] s?c kh?e. K? thu?t th? c ki?m sot bao g?m:  Th? mm mi. B?t ??u v?i vi?c th? vo (ht) b?ng m?i trong 1 giy. Sau ?, mm mi nh? th? qu v? s? hut so v th? ra (th?)  Th? b?ng c? honh. B?t ??u b?ng cch ??t m?t tay ln b?ng, ngay trn th?t l?ng c?a qu v?. Ht vo th?t ch?m b?ng m?i. Bn tay trn b?ng c?n di chuy?n ra ngoi. Sau ? mm mi v th? ra th?t ch?m. Qu v? s? c th? c?m nh?n ???c bn tay trn b?ng di chuy?n trong khi th? ra.  Tm hi?u v s? d?ng k? thu?t ho c ki?m sot ?? ??y d?ch nh?y ra kh?i ph?i. Ho c ki?m sot l m?t lo?t cc l?n ho ng?n, t?ng d?n. Cc b??c c?a k? thu?t ho c ki?m sot l: 1. H?i nghing ??u v? pha tr??c. 2. Th? th?t su b?ng cch s? d?ng th? b?ng c? honh. 3. C? g?ng nn th? trong 3 giy. 4. Lun ?? mi?ng h?i h trong khi ho hai l?n. 5. Nh? b?t c? d?ch nh?y no vo kh?n gi?y. 6. Ngh? ng?i v l?p l?i cc b??c ny m?t l?n ho?c hai l?n khi c?n thi?t. ?I [facility] N?U:   Qu v? b? ho ra nhi?u d?ch nh?y h?n bnh th??ng.  C s? thay ??i v? mu s?c ho?c ?? ??c c?a d?ch nh?y.  Qu v? th? n?ng nh?c h?n bnh th??ng.  Qu  v? th? nhanh h?n bnh th??ng. NGAY L?P T?C ?I [facility] N?U:   Qu v? b? kh th? trong khi ngh? ng?i.  Qu v? b? kh th? khi?n qu v? khng th?:  Ni chuy?n.  Th?c hi?n cc ho?t ??ng th? ch?t bnh th??ng.  Qu v? b? ?au ng?c ko di h?n 5 pht.  Mu da c?a qu v? xanh tm h?n bnh th??ng.  Qu v? ?o ?? bo ha x-xy th?p trong h?n 5 pht b?ng m?t my ?o ?? bo ha -xy trong mu. ??M B?O  QU V?:   Hi?u r cc h??ng d?n ny.  S? theo di tnh tr?ng c?a mnh.  S? yu c?u tr? gip ngay l?p t?c n?u qu v? c?m th?y khng kh?e ho?c th?y tr?m tr?ng h?n. Document Released: 10/04/2004 Document Revised: 05/11/2013 [facility] Patient Information 2015 [place]. This information is not intended to replace advice given to you by your health care provider. Make sure you discuss any questions you have with your health care provider.  Chronic Obstructive Pulmonary Disease Chronic obstructive pulmonary disease (COPD) is a common lung condition in which airflow from the lungs is limited. COPD is a general term that can be used to describe many different lung problems that limit airflow, including both chronic bronchitis and emphysema. If you have COPD, your lung function will probably never return to normal, but there are measures you can take to improve lung function and make yourself feel better.  CAUSES   Smoking (common).   Exposure to secondhand smoke.   Genetic problems.  Chronic inflammatory lung diseases or recurrent infections. SYMPTOMS   Shortness of breath, especially with physical activity.   Deep, persistent (chronic) cough with a large amount of thick mucus.   Wheezing.   Rapid breaths (tachypnea).   Gray or bluish discoloration (cyanosis) of the skin, especially in fingers, toes, or lips.   Fatigue.   Weight loss.   Frequent infections or episodes when breathing symptoms become much worse (exacerbations).   Chest tightness. DIAGNOSIS  Your health care  provider will take a medical history and perform a physical examination to make the initial diagnosis. Additional tests for COPD may include:   Lung (pulmonary) function tests.  Chest X-ray.  CT scan.  Blood tests. TREATMENT  Treatment available to help you feel better when you have COPD includes:   Inhaler and nebulizer medicines. These help manage the symptoms of COPD and make your breathing more comfortable.  Supplemental oxygen. Supplemental oxygen is only helpful if you have a low oxygen level in your blood.   Exercise and physical activity. These are beneficial for nearly all people with COPD. Some people may also benefit from a pulmonary rehabilitation program. HOME CARE INSTRUCTIONS   Take all medicines (inhaled or pills) as directed by your health care provider.  Avoid over-the-counter medicines or cough syrups that dry up your airway (such as antihistamines) and slow down the elimination of secretions unless instructed otherwise by your health care provider.   If you are a smoker, the most important thing that you can do is stop smoking. Continuing to smoke will cause further lung damage and breathing trouble. Ask your health care provider for help with quitting smoking. He or she can direct you to community resources or hospitals that provide support.  Avoid exposure to irritants such as smoke, chemicals, and fumes that aggravate your breathing.  Use oxygen therapy and pulmonary rehabilitation if directed by your health care provider. If you require home oxygen therapy, ask your health care provider whether you should purchase a pulse oximeter to measure your oxygen level at home.   Avoid contact with individuals who have a contagious illness.  Avoid extreme temperature and humidity changes.  Eat healthy foods. Eating smaller, more frequent meals and resting before meals may help you maintain your strength.  Stay active, but balance activity with periods of rest.  Exercise and physical activity will help you maintain your ability to do things you want to do.  Preventing infection and hospitalization is very important when you  have COPD. Make sure to receive all the vaccines your health care provider recommends, especially the pneumococcal and influenza vaccines. Ask your health care provider whether you need a pneumonia vaccine.  Learn and use relaxation techniques to manage stress.  Learn and use controlled breathing techniques as directed by your health care provider. Controlled breathing techniques include:   Pursed lip breathing. Start by breathing in (inhaling) through your nose for 1 second. Then, purse your lips as if you were going to whistle and breathe out (exhale) through the pursed lips for 2 seconds.   Diaphragmatic breathing. Start by putting one hand on your abdomen just above your waist. Inhale slowly through your nose. The hand on your abdomen should move out. Then purse your lips and exhale slowly. You should be able to feel the hand on your abdomen moving in as you exhale.   Learn and use controlled coughing to clear mucus from your lungs. Controlled coughing is a series of short, progressive coughs. The steps of controlled coughing are:  1. Lean your head slightly forward.  2. Breathe in deeply using diaphragmatic breathing.  3. Try to hold your breath for 3 seconds.  4. Keep your mouth slightly open while coughing twice.  5. Spit any mucus out into a tissue.  6. Rest and repeat the steps once or twice as needed. SEEK MEDICAL CARE IF:   You are coughing up more mucus than usual.   There is a change in the color or thickness of your mucus.   Your breathing is more labored than usual.   Your breathing is faster than usual.  SEEK IMMEDIATE MEDICAL CARE IF:   You have shortness of breath while you are resting.   You have shortness of breath that prevents you from:  Being able to talk.   Performing your usual  physical activities.   You have chest pain lasting longer than 5 minutes.   Your skin color is more cyanotic than usual.  You measure low oxygen saturations for longer than 5 minutes with a pulse oximeter. MAKE SURE YOU:   Understand these instructions.  Will watch your condition.  Will get help right away if you are not doing well or get worse. Document Released: 10/04/2004 Document Revised: 05/11/2013 Document Reviewed: 08/21/2012 [facility] Patient Information 2015 [place], [place]. This information is not intended to replace advice given to you by your health care provider. Make sure you discuss any questions you have with your health care provider.

## 2013-08-31 NOTE — Plan of Care (Signed)
Problem: Food- and Nutrition-Related Knowledge Deficit (NB-1.1) Goal: Nutrition education Formal process to instruct or train a patient/client in a skill or to impart knowledge to help patients/clients voluntarily manage or modify food choices and eating behavior to maintain or improve health. Outcome: Completed/Met Date Met:  08/31/13  RD consulted for nutrition education regarding diabetes.   Pt is newly diagnosed with diabetes. Pt reports never having previous diabetes education before. Discussed what diabetes is and the role of insulin in the body. Pt reports eating an asian diet with plenty of rice and vegetables, discussed the effect rice and other carbohydrates have on blood sugar levels. Pt is unsure of plan outside of hospital, encouraged pt to seek outpatient services for further diabetes education.     Lab Results  Component Value Date    HGBA1C 7.4* 08/27/2013    RD provided "Type 2 Diabetes Nutrition Therapy" and "Using Nutrition Labels: Carbohydrates" handout from the Academy of Nutrition and Dietetics. Discussed different food groups and their effects on blood sugar, emphasizing carbohydrate-containing foods. Provided list of carbohydrates and recommended serving sizes of common foods.  Discussed importance of controlled and consistent carbohydrate intake throughout the day. Provided examples of ways to balance meals/snacks and encouraged intake of high-fiber, whole grain complex carbohydrates. Teach back method used.  Expect fair compliance.  Body mass index is 25.23 kg/(m^2). Pt meets criteria for overweight based on current BMI.  Current diet order is modified carb, patient is consuming approximately 100% of meals at this time. Labs and medications reviewed. No further nutrition interventions warranted at this time. RD contact information provided. If additional nutrition issues arise, please re-consult RD.   , MS, PLDN Provisionally Licensed Dietitian  Nutritionist Pager: [phone]         

## 2013-08-31 NOTE — Evaluation (Signed)
Physical Therapy Evaluation Patient Details Name: [name] MRN: [id number] DOB: September 11, 1945 Today's Date: 08/31/2013   History of Present Illness  68 y.o. male With a hx of prior tobacco abuse (quit 13yrs ago), cad, htn, and copd who presents to the ed with worsening sob and wheezing. Dx of obstructive chronic bronchitis.   Clinical Impression  *Pt independently walked 500' without assistive device, no LOB. Mild SOB with walking, SaO2 95% on RA, HR 96 with walking. No further PT indicated as pt is independent with mobility. Pt is safe to walk independently in halls, RN notified. PT signing off. **    Follow Up Recommendations No PT follow up    Equipment Recommendations  None recommended by PT    Recommendations for Other Services       Precautions / Restrictions Precautions Precautions: None Restrictions Weight Bearing Restrictions: No      Mobility  Bed Mobility Overal bed mobility: Independent                Transfers Overall transfer level: Independent                  Ambulation/Gait Ambulation/Gait assistance: Independent Ambulation Distance (Feet): 500 Feet Assistive device: None Gait Pattern/deviations: WFL(Within Functional Limits)   Gait velocity interpretation: at or above normal speed for age/gender General Gait Details: Mild 2/4 SOB with walking, SaO2 95% on RA with walking, HR 96 walking  Stairs            Wheelchair Mobility    Modified Rankin (Stroke Patients Only)       Balance Overall balance assessment: Independent                                           Pertinent Vitals/Pain Pain Assessment: 0-10 Pain Score: 3  Pain Location: chest Pain Descriptors / Indicators: Tightness Pain Intervention(s): Monitored during session    Home Living Family/patient expects to be discharged to:: Private residence Living Arrangements: Spouse/significant other Available Help at Discharge: Family;Available 24  hours/day   Home Access: Level entry     Home Layout: One level        Prior Function Level of Independence: Independent               Hand Dominance        Extremity/Trunk Assessment   Upper Extremity Assessment: Overall WFL for tasks assessed           Lower Extremity Assessment: Overall WFL for tasks assessed      Cervical / Trunk Assessment: Normal  Communication   Communication: No difficulties;Prefers language other than English (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, corporate)  but can communicate in [name])  Cognition Arousal/Alertness: Awake/alert Behavior During Therapy: WFL for tasks assessed/performed Overall Cognitive Status: Within Functional Limits for tasks assessed                      General Comments      Exercises        Assessment/Plan    PT Assessment Patent does not need any further PT services  PT Diagnosis     PT Problem List    PT Treatment Interventions     PT Goals (Current goals can be found in the Care Plan section) Acute Rehab PT Goals Patient Stated Goal: to go home PT Goal Formulation: No goals set, d/c therapy  Frequency     Barriers to discharge        Co-evaluation               End of Session   Activity Tolerance: Patient tolerated treatment well Patient left: in chair;with call bell/phone within reach Nurse Communication: Mobility status         Time: 5993-5701 PT Time Calculation (min): 11 min   Charges:     PT Treatments $Gait Training: 8-22 mins   PT G Codes:          Philomena Doheny 08/31/2013, 9:42 AM [phone]

## 2013-09-11 ENCOUNTER — Other Ambulatory Visit: Payer: Self-pay | Admitting: Critical Care Medicine

## 2013-09-18 ENCOUNTER — Encounter: Payer: Self-pay | Admitting: Critical Care Medicine

## 2013-09-18 ENCOUNTER — Ambulatory Visit (INDEPENDENT_AMBULATORY_CARE_PROVIDER_SITE_OTHER): Payer: Commercial Managed Care - HMO | Admitting: Critical Care Medicine

## 2013-09-18 VITALS — BP 130/82 | HR 83 | Temp 97.0°F | Ht 61.0 in | Wt 142.6 lb

## 2013-09-18 DIAGNOSIS — E669 Obesity, unspecified: Secondary | ICD-10-CM

## 2013-09-18 DIAGNOSIS — J449 Chronic obstructive pulmonary disease, unspecified: Secondary | ICD-10-CM

## 2013-09-18 MED ORDER — LOSARTAN POTASSIUM 50 MG PO TABS: 50.0000 mg | ORAL_TABLET | Freq: Every day | ORAL | Status: DC

## 2013-09-18 MED ORDER — PREDNISONE 10 MG PO TABS: ORAL_TABLET | ORAL | Status: DC

## 2013-09-18 MED ORDER — LEVOFLOXACIN 500 MG PO TABS: 500.0000 mg | ORAL_TABLET | Freq: Every day | ORAL | Status: DC

## 2013-09-18 NOTE — Assessment & Plan Note (Signed)
Copd Gold C with exacerbation Plan Prednisone 10mg  Take 4 for three days 3 for three days 2 for three days 1 for three days and stop Levaquin 500mg  one daily for 7days Stop lisinopril  Start losartan one daily Stay on symbicort and spiriva Return 2 weeks recheck tammy parrett and Flu vaccine at that time along with Prevnar 13 vaccine,

## 2013-09-18 NOTE — Progress Notes (Signed)
Subjective:    Patient ID: [name], male    DOB: April 14, 1945, 68 y.o.   MRN: [id number]  HPI   68 y.o.   Asian male patient with a known history of chronic obstructive pulmonary disease with FEV1 of 63% associated CAD non obstructed with recent Non STEMI 6/11. Hyperlipidemia and HTN   09/18/2013 Chief Complaint  Patient presents with  . Acute Visit    increased SOB, prod cough with thick mucus, and chest burning/lung pain.  More dyspnea, cough worse, thick mucus, heartburn, pain in chest area. No radiation of pain.  Burning pain is noted .   No edema in feet.  NTG is used and helps to some degree. Pt in hosp 08/2013 2x for dyspnea and copd flare   Adm 7/28 for Chest pain: Cath done: 08/03/13.  CARDIAC Cath:  Procedural Findings:  Hemodynamics:  AO 123/62 mean 87 mm Hg  LV 122/19 mm Hg  Coronary angiography:  Coronary dominance: right  Left mainstem: Normal  Left anterior descending (LAD): Mild disease in the proximal and mid LAD less than 20%.  Left circumflex (LCx): Mild disease in the mid vessel to 20%  Right coronary artery (RCA): The stents in the proximal and Mid RCA are widely patent. The stent in the POM has mild diffuse disease up to 30%.  Left ventriculography: Left ventricular systolic function is normal, LVEF is estimated at 55-65%, there is no significant mitral regurgitation  Final Conclusions:  1. Nonobstructive CAD  2. Normal LV function.  Recommendations: Will continue medical therapy. I think his initial presentation was related to COPD exacerbation with bronchospasm. Plan Steroid taper to prior dose over the next 5-6 days.     Review of Systems  Constitutional:   No  weight loss, night sweats,  Fevers, chills, + fatigue, lassitude. HEENT:   No headaches,  Difficulty swallowing,  Tooth/dental problems,  Sore throat,                No sneezing, itching, ear ache, nasal congestion, post nasal drip,   CV:  No chest pain,  Orthopnea, PND, swelling in lower  extremities, anasarca, dizziness, palpitations  GI  No heartburn, indigestion, abdominal pain, nausea, vomiting, diarrhea, change in bowel habits, loss of appetite  Resp:  .  Notes excess mucus, notes productive cough,  No non-productive cough,  No coughing up of blood.  Notes  change in color of mucus.  No wheezing.  No chest wall deformity  Skin: no rash or lesions.  GU: no dysuria, change in color of urine, no urgency or frequency.  No flank pain.  MS:  No joint pain or swelling.  No decreased range of motion.  No back pain.  Psych:  No change in mood or affect. No depression or anxiety.  No memory loss.     Objective:   Physical Exam   Filed Vitals:   09/18/13 1025  BP: 130/82  Pulse: 83  Temp: 97 F (36.1 C)  TempSrc: Oral  Height: 5' 1"  (1.549 m)  Weight: 142 lb 9.6 oz (64.683 kg)  SpO2: 94%    Gen: Pleasant, well-nourished, in no distress,  normal affect   ENT: No lesions,  mouth clear,  oropharynx clear, no postnasal drip  Neck: No JVD, no TMG, no carotid bruits  Lungs: No use of accessory muscles, no dullness to percussion, distant BS,  exp wheezing  Cardiovascular: RRR, heart sounds normal, no murmur or gallops, no peripheral edema  Abdomen: soft and NT, no  HSM,  BS normal  Musculoskeletal: No deformities, no cyanosis or clubbing  Neuro: alert, non focal  Skin: Warm, no lesions or rashes      Assessment & Plan:   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 albuterol (PROVENTIL HFA) 108 (90 BASE) MCG/ACT inhaler 2 puffs twice daily  . albuterol (PROVENTIL) (2.5 MG/3ML) 0.083% nebulizer solution USE 1 VIAL IN NEBULIZER TWICE A DAY AS NEEDED  . amLODipine (NORVASC) 5 MG tablet Take 1 tablet by mouth daily.  . Marland Kitchenspirin 81 MG tablet Take 81 mg by mouth daily.   . budesonide-formoterol (SYMBICORT) 160-4.5 MCG/ACT inhaler Inhale 2 puffs into the lungs 2 (two) times daily.  . Marland Kitchenzetimibe (ZETIA) 10 MG tablet Take 1 tablet (10 mg total) by mouth daily.  . Marland KitchenLOR-CON M20 20 MEQ tablet Take 1 tablet by mouth daily.  . pantoprazole (PROTONIX) 40 MG tablet Take 40 mg by mouth daily.  . Marland KitchenPIRIVA HANDIHALER 18 MCG inhalation capsule Place 18 mcg into inhaler and inhale daily.   . [DISCONTINUED] lisinopril (PRINIVIL,ZESTRIL) 5 MG tablet Take 1 tablet (5 mg total) by mouth daily.  . [DISCONTINUED] PROVENTIL HFA 108 (90 BASE) MCG/ACT inhaler INHALE 2 PUFFS INTO THE LUNGS EVERY 6 HOURS AS NEEDED FOR WHEEZING OR SHORTNESS OF BREATH  . Blood Glucose Monitoring Suppl (BLOOD GLUCOSE METER KIT AND SUPPLIES) Dispense based on patient and insurance preference. Use up to four times daily as directed. (FOR ICD-9 250.00, 250.01).  . Marland Kitchenevofloxacin (LEVAQUIN) 500 MG tablet Take 1 tablet (500 mg total) by mouth daily.  . Marland Kitchenosartan (COZAAR) 50 MG tablet Take 1 tablet (50 mg total) by mouth daily.  . nitroGLYCERIN (NITROSTAT) 0.4 MG SL tablet Place 1 tablet (0.4 mg total) under the tongue every 5 (five) minutes as needed for chest pain.  . predniSONE (DELTASONE) 10 MG tablet Take 4 for three days 3 for three days 2 for three days 1 for three days and stop  . [DISCONTINUED] albuterol (PROVENTIL HFA;VENTOLIN HFA) 108 (90 BASE) MCG/ACT inhaler Inhale 2 puffs into the lungs every 6 (six) hours as needed for wheezing or shortness of breath.  . [DISCONTINUED] atorvastatin (LIPITOR) 40 MG tablet Take 40 mg by mouth every morning.  . [DISCONTINUED] omeprazole  (PRILOSEC) 20 MG capsule Take 1 capsule (20 mg total) by mouth 2 (two) times daily before a meal.

## 2013-09-18 NOTE — Patient Instructions (Signed)
Prednisone 10mg  Take 4 for three days 3 for three days 2 for three days 1 for three days and stop Levaquin 500mg  one daily for 7days Stop lisinopril  Start losartan one daily Stay on symbicort and spiriva Return 2 weeks recheck tammy parrett and Flu vaccine at that time along with Prevnar 13 vaccine,  Too ill for this now

## 2013-09-25 ENCOUNTER — Other Ambulatory Visit: Payer: Self-pay | Admitting: Critical Care Medicine

## 2013-10-01 ENCOUNTER — Ambulatory Visit (INDEPENDENT_AMBULATORY_CARE_PROVIDER_SITE_OTHER): Payer: Commercial Managed Care - HMO | Admitting: Adult Health

## 2013-10-01 ENCOUNTER — Encounter: Payer: Self-pay | Admitting: Adult Health

## 2013-10-01 VITALS — BP 112/64 | HR 83 | Temp 98.6°F | Ht 61.0 in | Wt 145.6 lb

## 2013-10-01 DIAGNOSIS — J4489 Other specified chronic obstructive pulmonary disease: Secondary | ICD-10-CM

## 2013-10-01 DIAGNOSIS — Z23 Encounter for immunization: Secondary | ICD-10-CM

## 2013-10-01 DIAGNOSIS — J449 Chronic obstructive pulmonary disease, unspecified: Secondary | ICD-10-CM

## 2013-10-01 NOTE — Progress Notes (Signed)
   Subjective:    Patient ID: [name], male    DOB: 28-Jan-1945, 68 y.o.   MRN: [id number]  HPI 79  Asian male patient with a known history of chronic obstructive pulmonary disease with FEV1 of 63% associated CAD non obstructed with recent Non STEMI 6/11. Hyperlipidemia and HTN   10/01/2013 Follow up COPD  Interpretor present for exam.  Returns for 2 week follow up .  Seen last ov for AECOPD , tx/ w steroid and levaquin.  Also changed ACE to Losartan.  He is feeling better with less cough and wheezing .  Pt is tolerating Losartan w/ good b/p 112/64.  Has someone at home to ready instructions.  Wants flu shot today.    Review of Systems Constitutional:   No  weight loss, night sweats,  Fevers, chills,  +tigue, or  lassitude.  HEENT:   No headaches,  Difficulty swallowing,  Tooth/dental problems, or  Sore throat,                No sneezing, itching, ear ache,  +nasal congestion, post nasal drip,   CV:  No chest pain,  Orthopnea, PND, swelling in lower extremities, anasarca, dizziness, palpitations, syncope.   GI  No heartburn, indigestion, abdominal pain, nausea, vomiting, diarrhea, change in bowel habits, loss of appetite, bloody stools.   Resp:   No chest wall deformity  Skin: no rash or lesions.  GU: no dysuria, change in color of urine, no urgency or frequency.  No flank pain, no hematuria   MS:  No joint pain or swelling.  No decreased range of motion.  No back pain.  Psych:  No change in mood or affect. No depression or anxiety.  No memory loss.         Objective:   Physical Exam GEN: A/Ox3; pleasant , NAD, elderly   HEENT:  [place]/AT,  EACs-clear, TMs-wnl, NOSE-clear, THROAT-clear, no lesions, no postnasal drip or exudate noted.   NECK:  Supple w/ fair ROM; no JVD; normal carotid impulses w/o bruits; no thyromegaly or nodules palpated; no lymphadenopathy.  RESP  Clear  P & A; w/o, wheezes/ rales/ or rhonchi.no accessory muscle use, no dullness to  percussion  CARD:  RRR, no m/r/g  , no peripheral edema, pulses intact, no cyanosis or clubbing.  GI:   Soft & nt; nml bowel sounds; no organomegaly or masses detected.  Musco: Warm bil, no deformities or joint swelling noted.   Neuro: alert, no focal deficits noted.    Skin: Warm, no lesions or rashes         Assessment & Plan:

## 2013-10-01 NOTE — Patient Instructions (Signed)
Continue on Symbicort and Spiriva  Flu shot today  Follow up with Primary doctor for blood pressure management .  Follow up Dr. Joya Gaskins  In 3 months , will give Prevnar vaccine at that time .  Please contact office for sooner follow up if symptoms do not improve or worsen or seek emergency care

## 2013-10-01 NOTE — Assessment & Plan Note (Signed)
Exacerbation now resolved   Plan  Continue on Symbicort and Spiriva  Flu shot today  Follow up with Primary doctor for blood pressure management .  Follow up Dr. Joya Gaskins  In 3 months , will give Prevnar vaccine at that time  Please contact office for sooner follow up if symptoms do not improve or worsen or seek emergency care

## 2013-10-01 NOTE — Telephone Encounter (Signed)
Received levaquin rx request from pharmacy.   Levaquin was given x 7 days on 09/18/13 OV with PW. Called, spoke with pt - pt reports he finished the 7 days course given on 09/18/13 but has some chest heaviness.  Believes the abx helps with this and would like rx.  Pt has a pending appt for a 2 wk follow up with TP tomorrow.  We have rescheduled this to today at 3:15 at [facility] office.  Pt confirmed appt and voiced no further questions or concerns at this time.

## 2013-10-02 ENCOUNTER — Ambulatory Visit: Payer: Commercial Managed Care - HMO | Admitting: Adult Health

## 2013-10-02 ENCOUNTER — Ambulatory Visit: Payer: Medicare HMO | Admitting: Adult Health

## 2013-10-03 ENCOUNTER — Other Ambulatory Visit: Payer: Self-pay | Admitting: Critical Care Medicine

## 2013-10-05 NOTE — Telephone Encounter (Signed)
Received refill request for prednisone 10 mg tablets.   Medication is not on pt's current med list. According to last OV with Dr. Joya Gaskins on 09/18/13: Prednisone 10mg  Take 4 for three days 3 for three days 2 for three days 1 for three days and stop During visit on 10/01/13 with Tammy P, NP, it was not mentioned to continue with prednisone in pt instructions.  Called, spoke with pt.  Pt reports he did not request this and nothing further needed.

## 2013-10-09 ENCOUNTER — Ambulatory Visit: Payer: Medicare HMO | Admitting: *Deleted

## 2013-10-14 ENCOUNTER — Other Ambulatory Visit: Payer: Self-pay | Admitting: Critical Care Medicine

## 2013-10-30 ENCOUNTER — Other Ambulatory Visit: Payer: Self-pay

## 2013-10-30 MED ORDER — POTASSIUM CHLORIDE CRYS ER 20 MEQ PO TBCR: 20.0000 meq | EXTENDED_RELEASE_TABLET | Freq: Every day | ORAL | Status: DC

## 2013-12-17 ENCOUNTER — Encounter (HOSPITAL_COMMUNITY): Payer: Self-pay | Admitting: Cardiology

## 2014-01-04 ENCOUNTER — Other Ambulatory Visit: Payer: Commercial Managed Care - HMO

## 2014-01-05 ENCOUNTER — Ambulatory Visit: Payer: Commercial Managed Care - HMO | Admitting: Cardiology

## 2014-01-12 ENCOUNTER — Other Ambulatory Visit: Payer: Self-pay | Admitting: Critical Care Medicine

## 2014-03-05 ENCOUNTER — Other Ambulatory Visit: Payer: Self-pay | Admitting: Critical Care Medicine

## 2014-03-25 ENCOUNTER — Telehealth: Payer: Self-pay | Admitting: Critical Care Medicine

## 2014-03-25 NOTE — Telephone Encounter (Signed)
i am ok with the change to ventolin

## 2014-03-25 NOTE — Telephone Encounter (Signed)
Received a form from [facility] stating that they will not cover the pts proventil HFA but they will cover the ventolin HFA.  PW please advise if ok to change this medication.  Thanks  No Known Allergies  Current Outpatient Prescriptions on File Prior to Visit  Medication Sig Dispense Refill  . albuterol (PROVENTIL HFA) 108 (90 BASE) MCG/ACT inhaler 2 puffs twice daily    . albuterol (PROVENTIL) (2.5 MG/3ML) 0.083% nebulizer solution USE 1 VIAL IN NEBULIZER TWICE A DAY AS NEEDED 375 mL 0  . amLODipine (NORVASC) 5 MG tablet Take 1 tablet by mouth daily.    [name] aspirin 81 MG tablet Take 81 mg by mouth daily.     . Blood Glucose Monitoring Suppl (BLOOD GLUCOSE METER KIT AND SUPPLIES) Dispense based on patient and insurance preference. Use up to four times daily as directed. (FOR ICD-9 250.00, 250.01). 1 each 0  . budesonide-formoterol (SYMBICORT) 160-4.5 MCG/ACT inhaler Inhale 2 puffs into the lungs 2 (two) times daily.    [name] ezetimibe (ZETIA) 10 MG tablet Take 1 tablet (10 mg total) by mouth daily. 30 tablet 6  . losartan (COZAAR) 50 MG tablet Take 1 tablet (50 mg total) by mouth daily. 60 tablet 6  . nitroGLYCERIN (NITROSTAT) 0.4 MG SL tablet Place 1 tablet (0.4 mg total) under the tongue every 5 (five) minutes as needed for chest pain. 25 tablet 11  . pantoprazole (PROTONIX) 40 MG tablet Take 40 mg by mouth daily.    . potassium chloride SA (KLOR-CON M20) 20 MEQ tablet Take 1 tablet (20 mEq total) by mouth daily. 30 tablet 6  . PROVENTIL HFA 108 (90 BASE) MCG/ACT inhaler INHALE 2 PUFFS INTO THE LUNGS EVERY 6 HOURS AS NEEDED FOR WHEEZING OR SHORTNESS OF BREATH 6.7 each 5  . PROVENTIL HFA 108 (90 BASE) MCG/ACT inhaler INHALE 2 PUFFS INTO THE LUNGS EVERY 6 HOURS AS NEEDED FOR WHEEZING OR SHORTNESS OF BREATH 6.7 Inhaler 5  . SPIRIVA HANDIHALER 18 MCG inhalation capsule Place 18 mcg into inhaler and inhale daily.     . [DISCONTINUED] arformoterol (BROVANA) 15 MCG/2ML NEBU Take 2 mLs (15 mcg total) by  nebulization 2 (two) times daily. Dx code: 496 120 mL 11  . [DISCONTINUED] budesonide (PULMICORT) 0.5 MG/2ML nebulizer solution Take 2 mLs (0.5 mg total) by nebulization 2 (two) times daily. DX:  496 120 mL 11  . [DISCONTINUED] esomeprazole (NEXIUM) 40 MG capsule Take 1 capsule (40 mg total) by mouth daily. 30 capsule 6   No current facility-administered medications on file prior to visit.

## 2014-03-26 MED ORDER — ALBUTEROL SULFATE HFA 108 (90 BASE) MCG/ACT IN AERS: 2.0000 | INHALATION_SPRAY | Freq: Four times a day (QID) | RESPIRATORY_TRACT | Status: DC | PRN

## 2014-03-26 NOTE — Telephone Encounter (Signed)
Rx sent to pharmacy for Ventolin.  Patient notified.

## 2014-04-30 ENCOUNTER — Emergency Department (HOSPITAL_COMMUNITY): Payer: Commercial Managed Care - HMO

## 2014-04-30 ENCOUNTER — Encounter (HOSPITAL_COMMUNITY): Payer: Self-pay | Admitting: Emergency Medicine

## 2014-04-30 ENCOUNTER — Inpatient Hospital Stay (HOSPITAL_COMMUNITY)
Admission: EM | Admit: 2014-04-30 | Discharge: 2014-05-06 | DRG: 871 | Disposition: A | Discharge status: Home / Self Care | Payer: Commercial Managed Care - HMO | Attending: Internal Medicine | Admitting: Internal Medicine

## 2014-04-30 DIAGNOSIS — J209 Acute bronchitis, unspecified: Secondary | ICD-10-CM | POA: Diagnosis present

## 2014-04-30 DIAGNOSIS — J45909 Unspecified asthma, uncomplicated: Secondary | ICD-10-CM | POA: Diagnosis present

## 2014-04-30 DIAGNOSIS — I739 Peripheral vascular disease, unspecified: Secondary | ICD-10-CM | POA: Diagnosis present

## 2014-04-30 DIAGNOSIS — E1165 Type 2 diabetes mellitus with hyperglycemia: Secondary | ICD-10-CM | POA: Diagnosis present

## 2014-04-30 DIAGNOSIS — I10 Essential (primary) hypertension: Secondary | ICD-10-CM | POA: Diagnosis not present

## 2014-04-30 DIAGNOSIS — J441 Chronic obstructive pulmonary disease with (acute) exacerbation: Secondary | ICD-10-CM | POA: Diagnosis present

## 2014-04-30 DIAGNOSIS — E785 Hyperlipidemia, unspecified: Secondary | ICD-10-CM | POA: Diagnosis not present

## 2014-04-30 DIAGNOSIS — Z7982 Long term (current) use of aspirin: Secondary | ICD-10-CM | POA: Diagnosis not present

## 2014-04-30 DIAGNOSIS — R0602 Shortness of breath: Secondary | ICD-10-CM | POA: Diagnosis not present

## 2014-04-30 DIAGNOSIS — I252 Old myocardial infarction: Secondary | ICD-10-CM | POA: Diagnosis not present

## 2014-04-30 DIAGNOSIS — K219 Gastro-esophageal reflux disease without esophagitis: Secondary | ICD-10-CM | POA: Diagnosis present

## 2014-04-30 DIAGNOSIS — T380X5A Adverse effect of glucocorticoids and synthetic analogues, initial encounter: Secondary | ICD-10-CM | POA: Diagnosis present

## 2014-04-30 DIAGNOSIS — Z87891 Personal history of nicotine dependence: Secondary | ICD-10-CM

## 2014-04-30 DIAGNOSIS — E876 Hypokalemia: Secondary | ICD-10-CM | POA: Diagnosis present

## 2014-04-30 DIAGNOSIS — R05 Cough: Secondary | ICD-10-CM | POA: Diagnosis not present

## 2014-04-30 DIAGNOSIS — J439 Emphysema, unspecified: Secondary | ICD-10-CM | POA: Diagnosis not present

## 2014-04-30 DIAGNOSIS — J181 Lobar pneumonia, unspecified organism: Secondary | ICD-10-CM | POA: Diagnosis not present

## 2014-04-30 DIAGNOSIS — IMO0001 Reserved for inherently not codable concepts without codable children: Secondary | ICD-10-CM | POA: Insufficient documentation

## 2014-04-30 DIAGNOSIS — D72829 Elevated white blood cell count, unspecified: Secondary | ICD-10-CM | POA: Diagnosis not present

## 2014-04-30 DIAGNOSIS — E1169 Type 2 diabetes mellitus with other specified complication: Secondary | ICD-10-CM | POA: Diagnosis present

## 2014-04-30 DIAGNOSIS — A419 Sepsis, unspecified organism: Principal | ICD-10-CM | POA: Diagnosis present

## 2014-04-30 DIAGNOSIS — I2510 Atherosclerotic heart disease of native coronary artery without angina pectoris: Secondary | ICD-10-CM | POA: Diagnosis present

## 2014-04-30 DIAGNOSIS — J9601 Acute respiratory failure with hypoxia: Secondary | ICD-10-CM | POA: Diagnosis present

## 2014-04-30 DIAGNOSIS — J189 Pneumonia, unspecified organism: Secondary | ICD-10-CM | POA: Diagnosis not present

## 2014-04-30 LAB — BLOOD GAS, ARTERIAL
Acid-Base Excess: 0.9 mmol/L (ref 0.0–2.0)
BICARBONATE: 23.8 meq/L (ref 20.0–24.0)
DRAWN BY: 422461
O2 Content: 2 L/min
O2 Saturation: 93.7 %
PCO2 ART: 36.6 mmHg (ref 35.0–45.0)
PO2 ART: 76.9 mmHg — AB (ref 80.0–100.0)
Patient temperature: 101.1
TCO2: 20.6 mmol/L (ref 0–100)
pH, Arterial: 7.435 (ref 7.350–7.450)

## 2014-04-30 LAB — CBC WITH DIFFERENTIAL/PLATELET
BASOS ABS: 0 10*3/uL (ref 0.0–0.1)
BASOS PCT: 0 % (ref 0–1)
Eosinophils Absolute: 0.8 10*3/uL — ABNORMAL HIGH (ref 0.0–0.7)
Eosinophils Relative: 7 % — ABNORMAL HIGH (ref 0–5)
HCT: 43.8 % (ref 39.0–52.0)
Hemoglobin: 14.6 g/dL (ref 13.0–17.0)
Lymphocytes Relative: 18 % (ref 12–46)
Lymphs Abs: 2 10*3/uL (ref 0.7–4.0)
MCH: 29.9 pg (ref 26.0–34.0)
MCHC: 33.3 g/dL (ref 30.0–36.0)
MCV: 89.8 fL (ref 78.0–100.0)
Monocytes Absolute: 0.9 10*3/uL (ref 0.1–1.0)
Monocytes Relative: 8 % (ref 3–12)
NEUTROS PCT: 67 % (ref 43–77)
Neutro Abs: 7.5 10*3/uL (ref 1.7–7.7)
Platelets: 295 10*3/uL (ref 150–400)
RBC: 4.88 MIL/uL (ref 4.22–5.81)
RDW: 14.7 % (ref 11.5–15.5)
WBC: 11.1 10*3/uL — ABNORMAL HIGH (ref 4.0–10.5)

## 2014-04-30 LAB — COMPREHENSIVE METABOLIC PANEL
ALBUMIN: 3.7 g/dL (ref 3.5–5.2)
ALT: 31 U/L (ref 0–53)
AST: 27 U/L (ref 0–37)
Alkaline Phosphatase: 72 U/L (ref 39–117)
Anion gap: 5 (ref 5–15)
BUN: 13 mg/dL (ref 6–23)
CALCIUM: 8.3 mg/dL — AB (ref 8.4–10.5)
CHLORIDE: 107 mmol/L (ref 96–112)
CO2: 24 mmol/L (ref 19–32)
Creatinine, Ser: 0.9 mg/dL (ref 0.50–1.35)
GFR calc Af Amer: >90 mL/min (ref >90)
GFR, EST NON AFRICAN AMERICAN: 85 mL/min — AB (ref >90)
Glucose, Bld: 116 mg/dL — ABNORMAL HIGH (ref 70–99)
Potassium: 3.3 mmol/L — ABNORMAL LOW (ref 3.5–5.1)
SODIUM: 136 mmol/L (ref 135–145)
Total Bilirubin: 0.5 mg/dL (ref 0.3–1.2)
Total Protein: 6.6 g/dL (ref 6.0–8.3)

## 2014-04-30 LAB — I-STAT CG4 LACTIC ACID, ED: LACTIC ACID, VENOUS: 1.83 mmol/L (ref 0.5–2.0)

## 2014-04-30 LAB — URINALYSIS, ROUTINE W REFLEX MICROSCOPIC
BILIRUBIN URINE: NEGATIVE
Glucose, UA: NEGATIVE mg/dL
HGB URINE DIPSTICK: NEGATIVE
Ketones, ur: NEGATIVE mg/dL
LEUKOCYTES UA: NEGATIVE
Nitrite: NEGATIVE
Protein, ur: NEGATIVE mg/dL
SPECIFIC GRAVITY, URINE: 1.02 (ref 1.005–1.030)
Urobilinogen, UA: 0.2 mg/dL (ref 0.0–1.0)
pH: 5.5 (ref 5.0–8.0)

## 2014-04-30 LAB — I-STAT TROPONIN, ED: Troponin i, poc: 0 ng/mL (ref 0.00–0.08)

## 2014-04-30 MED ORDER — VANCOMYCIN HCL IN DEXTROSE 1-5 GM/200ML-% IV SOLN
1000.0000 mg | Freq: Once | INTRAVENOUS | Status: AC
  Administered 2014-04-30: 1000 mg via INTRAVENOUS
  Filled 2014-04-30: qty 200

## 2014-04-30 MED ORDER — IPRATROPIUM-ALBUTEROL 0.5-2.5 (3) MG/3ML IN SOLN
3.0000 mL | Freq: Once | RESPIRATORY_TRACT | Status: AC
  Administered 2014-04-30: 3 mL via RESPIRATORY_TRACT
  Filled 2014-04-30: qty 3

## 2014-04-30 MED ORDER — SODIUM CHLORIDE 0.9 % IV BOLUS (SEPSIS)
1000.0000 mL | Freq: Once | INTRAVENOUS | Status: AC
  Administered 2014-04-30: 1000 mL via INTRAVENOUS

## 2014-04-30 MED ORDER — IOHEXOL 350 MG/ML SOLN
100.0000 mL | Freq: Once | INTRAVENOUS | Status: AC | PRN
  Administered 2014-04-30: 100 mL via INTRAVENOUS

## 2014-04-30 MED ORDER — PIPERACILLIN-TAZOBACTAM 3.375 G IVPB
3.3750 g | Freq: Once | INTRAVENOUS | Status: AC
  Administered 2014-04-30: 3.375 g via INTRAVENOUS
  Filled 2014-04-30: qty 50

## 2014-04-30 MED ORDER — ACETAMINOPHEN 325 MG PO TABS
650.0000 mg | ORAL_TABLET | Freq: Once | ORAL | Status: AC
  Administered 2014-04-30: 650 mg via ORAL
  Filled 2014-04-30: qty 2

## 2014-04-30 NOTE — ED Provider Notes (Signed)
CSN: [id number]     Arrival date & time 04/30/14  2036 History   First MD Initiated Contact with Patient 04/30/14 2041     Chief Complaint  Patient presents with  . Shortness of Breath     (Consider location/radiation/quality/duration/timing/severity/associated sxs/prior Treatment) Patient is a 69 y.o. male presenting with shortness of breath.  Shortness of Breath Severity:  Moderate Onset quality:  Sudden Duration:  2 days Timing:  Constant Progression:  Worsening Chronicity:  New Associated symptoms: chest pain, cough, fever, sputum production and wheezing     Past Medical History  Diagnosis Date  . COPD (chronic obstructive pulmonary disease)     former smoker  . GERD (gastroesophageal reflux disease)   . CAD (coronary artery disease), native coronary artery 6/11, 2/14, 6/14; 7/15    a. thought due to vasospasm . cath - CAD LAD 40-50%, LCX 40%, RCA 25% EF 65%. lexiscan myoview EF 68% w/o evidence of ischemia or infarct b. STEMI s/p BMS-mid RCA c. BMS x 2- prox RCA, PLOM  . HTN (hypertension)   . Asthma   . Hyperlipidemia   . Tobacco abuse   . Allergic rhinitis    Past Surgical History  Procedure Laterality Date  . Left foot surgery      repair - and ankle   . Coronary angioplasty with stent placement  02/15/12    20% distal LM, 30-40% mid LAD, 50% ostial diag, 50-60% mid LCx, 99% focal mid RCA stenosis s/p BMS; 20% distal RCA disease, 50% prox PLB; LVEF 55-65%  . Coronary angioplasty with stent placement  07/01/12    30% prox LAD, 30% mid LAD, 20% mid LCx, 90% prox RCA s/p BMS, patent mid RCA stent, 30-40% distal RCA, 80% PLB s/p BMS; EF 55-65%  . Cardiac catheterization  08/03/13    non obstructive disease LCX 20%; LAD 20%; stents in prox RCA and mid are patent, mild in stent stenosis POM, 30%, normal EF 55-65%  . Left heart catheterization with coronary angiogram N/A 02/15/2012    Procedure: LEFT HEART CATHETERIZATION WITH CORONARY ANGIOGRAM;  Surgeon: Peter M [name], MD;   Location: [facility] CATH LAB;  Service: Cardiovascular;  Laterality: N/A;  . Percutaneous coronary stent intervention (pci-s) N/A 02/15/2012    Procedure: PERCUTANEOUS CORONARY STENT INTERVENTION (PCI-S);  Surgeon: Peter M [name], MD;  Location: [facility] CATH LAB;  Service: Cardiovascular;  Laterality: N/A;  . Left heart catheterization with coronary angiogram N/A 07/01/2012    Procedure: LEFT HEART CATHETERIZATION WITH CORONARY ANGIOGRAM;  Surgeon: Peter M [name], MD;  Location: [facility] CATH LAB;  Service: Cardiovascular;  Laterality: N/A;  . Percutaneous coronary stent intervention (pci-s)  07/01/2012    Procedure: PERCUTANEOUS CORONARY STENT INTERVENTION (PCI-S);  Surgeon: Peter M [name], MD;  Location: [facility] CATH LAB;  Service: Cardiovascular;;  . Left heart catheterization with coronary angiogram N/A 08/03/2013    Procedure: LEFT HEART CATHETERIZATION WITH CORONARY ANGIOGRAM;  Surgeon: Peter M [name], MD;  Location: [facility] CATH LAB;  Service: Cardiovascular;  Laterality: N/A;   No family history on file. History  Substance Use Topics  . Smoking status: Former Smoker -- 1.00 packs/day for 50 years    Types: Cigarettes    Quit date: 01/09/2008  . Smokeless tobacco: Never Used     Comment: rolled own cigarettes since age of 42,   . Alcohol Use: No     Comment: occassional     Review of Systems  Constitutional: Positive for fever.  Respiratory: Positive for cough, sputum production, shortness  of breath and wheezing.   Cardiovascular: Positive for chest pain.  Gastrointestinal: Positive for nausea.  Musculoskeletal: Positive for arthralgias.  All other systems reviewed and are negative.     Allergies  Review of patient's allergies indicates no known allergies.  Home Medications   Prior to Admission medications   Medication Sig Start Date End Date Taking? Authorizing Provider  albuterol (PROVENTIL HFA) 108 (90 BASE) MCG/ACT inhaler 2 puffs twice daily 09/11/13   Elsie Stain, MD  albuterol (PROVENTIL  HFA;VENTOLIN HFA) 108 (90 BASE) MCG/ACT inhaler Inhale 2 puffs into the lungs every 6 (six) hours as needed for wheezing or shortness of breath. 03/26/14   Elsie Stain, MD  albuterol (PROVENTIL) (2.5 MG/3ML) 0.083% nebulizer solution USE 1 VIAL IN NEBULIZER TWICE A DAY AS NEEDED 09/11/13   Elsie Stain, MD  amLODipine (NORVASC) 5 MG tablet Take 1 tablet by mouth daily.    Historical Provider, MD  aspirin 81 MG tablet Take 81 mg by mouth daily.     Historical Provider, MD  Blood Glucose Monitoring Suppl (BLOOD GLUCOSE METER KIT AND SUPPLIES) Dispense based on patient and insurance preference. Use up to four times daily as directed. (FOR ICD-9 250.00, 250.01). 08/31/13   Cherene Altes, MD  budesonide-formoterol [facility]) 160-4.5 MCG/ACT inhaler Inhale 2 puffs into the lungs 2 (two) times daily.    Historical Provider, MD  ezetimibe (ZETIA) 10 MG tablet Take 1 tablet (10 mg total) by mouth daily. 07/07/13   Peter M [name], MD  losartan (COZAAR) 50 MG tablet Take 1 tablet (50 mg total) by mouth daily. 09/18/13   Elsie Stain, MD  nitroGLYCERIN (NITROSTAT) 0.4 MG SL tablet Place 1 tablet (0.4 mg total) under the tongue every 5 (five) minutes as needed for chest pain. 06/18/12   Peter M [name], MD  pantoprazole (PROTONIX) 40 MG tablet Take 40 mg by mouth daily.    Historical Provider, MD  potassium chloride SA (KLOR-CON M20) 20 MEQ tablet Take 1 tablet (20 mEq total) by mouth daily. 10/30/13   Peter M [name], MD  PROVENTIL HFA 108 [phone] BASE) MCG/ACT inhaler INHALE 2 PUFFS INTO THE LUNGS EVERY 6 HOURS AS NEEDED FOR WHEEZING OR SHORTNESS OF BREATH 01/12/14   Elsie Stain, MD  PROVENTIL HFA 108 (90 BASE) MCG/ACT inhaler INHALE 2 PUFFS INTO THE LUNGS EVERY 6 HOURS AS NEEDED FOR WHEEZING OR SHORTNESS OF BREATH 03/05/14   Elsie Stain, MD  SPIRIVA HANDIHALER 18 MCG inhalation capsule Place 18 mcg into inhaler and inhale daily.  08/18/12   Historical Provider, MD   BP 171/88 mmHg  Pulse 119   Temp(Src) 102.5 F (39.2 C) (Oral)  Resp 22  SpO2 94% Physical Exam  Constitutional: He is oriented to person, place, and time. He appears well-developed and well-nourished.  HENT:  Head: Normocephalic.  Eyes: Pupils are equal, round, and reactive to light.  Neck: Neck supple.  Cardiovascular: Intact distal pulses.   Pulmonary/Chest: He is in respiratory distress. He has wheezes.  Abdominal: Soft. Bowel sounds are normal.  Musculoskeletal: He exhibits tenderness. He exhibits no edema.  Lymphadenopathy:    He has no cervical adenopathy.  Neurological: He is alert and oriented to person, place, and time.  Skin: Skin is warm and dry.  Psychiatric: He has a normal mood and affect.  Nursing note and vitals reviewed.   ED Course  Procedures (including critical care time) Labs Review Labs Reviewed  CULTURE, BLOOD (ROUTINE X 2)  CULTURE,  BLOOD (ROUTINE X 2)  URINE CULTURE  COMPREHENSIVE METABOLIC PANEL  CBC WITH DIFFERENTIAL/PLATELET  URINALYSIS, ROUTINE W REFLEX MICROSCOPIC  I-STAT CG4 LACTIC ACID, ED  I-STAT TROPOININ, ED    Imaging Review No results found.   EKG Interpretation   Date/Time:  Friday April 30 2014 20:39:40 EDT Ventricular Rate:  117 PR Interval:  133 QRS Duration: 88 QT Interval:  339 QTC Calculation: 473 R Axis:   81 Text Interpretation:  Sinus tachycardia Borderline right axis deviation No  significant change since last tracing Confirmed by ALLEN  MD, ANTHONY  ([id number]) on 04/30/2014 8:44:57 PM     Patient arrives in ED with shortness of breath, fever, tachycardia. Meets SIRS criteria.  Low oxygen saturation on ABG. No evidence of pneumonia or pulmonary embolism. Lactic acid 1.62. WBC 11.1. K 3.3. Ca 8.3. Normal troponin.  Initial temp of 102.5. Sepsis work-up initiated.  1000 cc saline fluid bolus.  Vancomycin and zosyn started.    Patient discussed with and seen by Dr. Mingo Amber.  Admission requested.  Discussed with Gasper Lloyd, MD, who will  admit. MDM   Final diagnoses:  SOB (shortness of breath)        Etta Quill, NP 05/01/14 [id number]  Evelina Bucy, MD 05/01/14 1539

## 2014-04-30 NOTE — ED Notes (Signed)
Bed: RK27 Expected date:  Expected time:  Means of arrival:  Comments: EMS [facility] fever

## 2014-04-30 NOTE — ED Notes (Signed)
Attempted to call RT for ABG blood draw. Line is busy. Will call back.

## 2014-04-30 NOTE — ED Notes (Signed)
Attempted to call RT on two different extensions. No answer. Will call again.

## 2014-04-30 NOTE — ED Notes (Signed)
Pt transported from home by EMS with [facility], productive cough, yellow phlegm, fever x 2 days. Motrin at 1300.

## 2014-05-01 ENCOUNTER — Encounter (HOSPITAL_COMMUNITY): Payer: Self-pay

## 2014-05-01 DIAGNOSIS — K219 Gastro-esophageal reflux disease without esophagitis: Secondary | ICD-10-CM | POA: Diagnosis present

## 2014-05-01 DIAGNOSIS — J209 Acute bronchitis, unspecified: Secondary | ICD-10-CM | POA: Diagnosis present

## 2014-05-01 DIAGNOSIS — J441 Chronic obstructive pulmonary disease with (acute) exacerbation: Secondary | ICD-10-CM

## 2014-05-01 DIAGNOSIS — I2510 Atherosclerotic heart disease of native coronary artery without angina pectoris: Secondary | ICD-10-CM

## 2014-05-01 DIAGNOSIS — Z87891 Personal history of nicotine dependence: Secondary | ICD-10-CM | POA: Diagnosis not present

## 2014-05-01 DIAGNOSIS — E1165 Type 2 diabetes mellitus with hyperglycemia: Secondary | ICD-10-CM

## 2014-05-01 DIAGNOSIS — I10 Essential (primary) hypertension: Secondary | ICD-10-CM

## 2014-05-01 DIAGNOSIS — I739 Peripheral vascular disease, unspecified: Secondary | ICD-10-CM | POA: Diagnosis present

## 2014-05-01 DIAGNOSIS — E785 Hyperlipidemia, unspecified: Secondary | ICD-10-CM | POA: Diagnosis present

## 2014-05-01 DIAGNOSIS — J9601 Acute respiratory failure with hypoxia: Secondary | ICD-10-CM | POA: Diagnosis present

## 2014-05-01 DIAGNOSIS — J181 Lobar pneumonia, unspecified organism: Secondary | ICD-10-CM | POA: Diagnosis present

## 2014-05-01 DIAGNOSIS — J189 Pneumonia, unspecified organism: Secondary | ICD-10-CM | POA: Diagnosis not present

## 2014-05-01 DIAGNOSIS — T380X5A Adverse effect of glucocorticoids and synthetic analogues, initial encounter: Secondary | ICD-10-CM | POA: Diagnosis present

## 2014-05-01 DIAGNOSIS — I252 Old myocardial infarction: Secondary | ICD-10-CM | POA: Diagnosis not present

## 2014-05-01 DIAGNOSIS — IMO0001 Reserved for inherently not codable concepts without codable children: Secondary | ICD-10-CM | POA: Insufficient documentation

## 2014-05-01 DIAGNOSIS — R0602 Shortness of breath: Secondary | ICD-10-CM

## 2014-05-01 DIAGNOSIS — A419 Sepsis, unspecified organism: Principal | ICD-10-CM

## 2014-05-01 DIAGNOSIS — Z7982 Long term (current) use of aspirin: Secondary | ICD-10-CM | POA: Diagnosis not present

## 2014-05-01 DIAGNOSIS — J45909 Unspecified asthma, uncomplicated: Secondary | ICD-10-CM | POA: Diagnosis present

## 2014-05-01 DIAGNOSIS — E876 Hypokalemia: Secondary | ICD-10-CM | POA: Diagnosis present

## 2014-05-01 LAB — BASIC METABOLIC PANEL
Anion gap: 9 (ref 5–15)
BUN: 12 mg/dL (ref 6–23)
CALCIUM: 8.2 mg/dL — AB (ref 8.4–10.5)
CO2: 24 mmol/L (ref 19–32)
CREATININE: 0.99 mg/dL (ref 0.50–1.35)
Chloride: 106 mmol/L (ref 96–112)
GFR calc non Af Amer: 82 mL/min — ABNORMAL LOW (ref >90)
Glucose, Bld: 168 mg/dL — ABNORMAL HIGH (ref 70–99)
Potassium: 3.8 mmol/L (ref 3.5–5.1)
Sodium: 139 mmol/L (ref 135–145)

## 2014-05-01 LAB — CBC
HEMATOCRIT: 43.8 % (ref 39.0–52.0)
Hemoglobin: 14.2 g/dL (ref 13.0–17.0)
MCH: 29.2 pg (ref 26.0–34.0)
MCHC: 32.4 g/dL (ref 30.0–36.0)
MCV: 90.1 fL (ref 78.0–100.0)
Platelets: 285 10*3/uL (ref 150–400)
RBC: 4.86 MIL/uL (ref 4.22–5.81)
RDW: 14.8 % (ref 11.5–15.5)
WBC: 8.5 10*3/uL (ref 4.0–10.5)

## 2014-05-01 LAB — GLUCOSE, CAPILLARY
Glucose-Capillary: 277 mg/dL — ABNORMAL HIGH (ref 70–99)
Glucose-Capillary: 173 mg/dL — ABNORMAL HIGH (ref 70–99)

## 2014-05-01 LAB — I-STAT CG4 LACTIC ACID, ED: Lactic Acid, Venous: 1.62 mmol/L (ref 0.5–2.0)

## 2014-05-01 LAB — PROCALCITONIN: PROCALCITONIN: 0.55 ng/mL

## 2014-05-01 LAB — LACTIC ACID, PLASMA
LACTIC ACID, VENOUS: 1 mmol/L (ref 0.5–2.0)
Lactic Acid, Venous: 1 mmol/L (ref 0.5–2.0)

## 2014-05-01 MED ORDER — ALBUTEROL SULFATE (2.5 MG/3ML) 0.083% IN NEBU
2.5000 mg | INHALATION_SOLUTION | Freq: Four times a day (QID) | RESPIRATORY_TRACT | Status: DC
  Administered 2014-05-01: 2.5 mg via RESPIRATORY_TRACT

## 2014-05-01 MED ORDER — POTASSIUM CHLORIDE CRYS ER 20 MEQ PO TBCR
20.0000 meq | EXTENDED_RELEASE_TABLET | Freq: Every day | ORAL | Status: DC
  Administered 2014-05-01 – 2014-05-06 (×6): 20 meq via ORAL
  Filled 2014-05-01 (×6): qty 1

## 2014-05-01 MED ORDER — HYDROMORPHONE HCL 1 MG/ML IJ SOLN: 0.5000 mg | INTRAMUSCULAR | Status: DC | PRN

## 2014-05-01 MED ORDER — ONDANSETRON HCL 4 MG/2ML IJ SOLN: 4.0000 mg | Freq: Four times a day (QID) | INTRAMUSCULAR | Status: DC | PRN

## 2014-05-01 MED ORDER — BUDESONIDE-FORMOTEROL FUMARATE 160-4.5 MCG/ACT IN AERO
2.0000 | INHALATION_SPRAY | Freq: Two times a day (BID) | RESPIRATORY_TRACT | Status: DC
  Administered 2014-05-01 – 2014-05-05 (×8): 2 via RESPIRATORY_TRACT
  Filled 2014-05-01: qty 6

## 2014-05-01 MED ORDER — METHYLPREDNISOLONE SODIUM SUCC 125 MG IJ SOLR
80.0000 mg | Freq: Two times a day (BID) | INTRAMUSCULAR | Status: DC
  Administered 2014-05-01 – 2014-05-03 (×4): 80 mg via INTRAVENOUS
  Filled 2014-05-01 (×5): qty 1.28

## 2014-05-01 MED ORDER — INSULIN ASPART 100 UNIT/ML ~~LOC~~ SOLN
0.0000 [IU] | Freq: Three times a day (TID) | SUBCUTANEOUS | Status: DC
  Administered 2014-05-01: 8 [IU] via SUBCUTANEOUS
  Administered 2014-05-02: 2 [IU] via SUBCUTANEOUS
  Administered 2014-05-02: 5 [IU] via SUBCUTANEOUS
  Administered 2014-05-02: 3 [IU] via SUBCUTANEOUS
  Administered 2014-05-03 (×2): 2 [IU] via SUBCUTANEOUS
  Administered 2014-05-03: 3 [IU] via SUBCUTANEOUS
  Administered 2014-05-04: 2 [IU] via SUBCUTANEOUS
  Administered 2014-05-04 – 2014-05-05 (×2): 3 [IU] via SUBCUTANEOUS
  Administered 2014-05-05: 5 [IU] via SUBCUTANEOUS

## 2014-05-01 MED ORDER — ONDANSETRON HCL 4 MG PO TABS
4.0000 mg | ORAL_TABLET | Freq: Four times a day (QID) | ORAL | Status: DC | PRN
  Administered 2014-05-03 – 2014-05-04 (×2): 4 mg via ORAL
  Filled 2014-05-01 (×2): qty 1

## 2014-05-01 MED ORDER — ACETAMINOPHEN 650 MG RE SUPP: 650.0000 mg | Freq: Four times a day (QID) | RECTAL | Status: DC | PRN

## 2014-05-01 MED ORDER — IPRATROPIUM-ALBUTEROL 0.5-2.5 (3) MG/3ML IN SOLN: 3.0000 mL | RESPIRATORY_TRACT | Status: DC

## 2014-05-01 MED ORDER — ASPIRIN 81 MG PO CHEW
81.0000 mg | CHEWABLE_TABLET | Freq: Every day | ORAL | Status: DC
  Administered 2014-05-01 – 2014-05-06 (×6): 81 mg via ORAL
  Filled 2014-05-01 (×7): qty 1

## 2014-05-01 MED ORDER — OXYCODONE HCL 5 MG PO TABS
5.0000 mg | ORAL_TABLET | ORAL | Status: DC | PRN
  Administered 2014-05-01 (×2): 5 mg via ORAL
  Filled 2014-05-01 (×2): qty 1

## 2014-05-01 MED ORDER — ENOXAPARIN SODIUM 40 MG/0.4ML ~~LOC~~ SOLN
40.0000 mg | SUBCUTANEOUS | Status: DC
  Administered 2014-05-01 – 2014-05-05 (×5): 40 mg via SUBCUTANEOUS
  Filled 2014-05-01 (×6): qty 0.4

## 2014-05-01 MED ORDER — TIOTROPIUM BROMIDE MONOHYDRATE 18 MCG IN CAPS
18.0000 ug | ORAL_CAPSULE | Freq: Every day | RESPIRATORY_TRACT | Status: DC
  Administered 2014-05-02: 18 ug via RESPIRATORY_TRACT
  Filled 2014-05-01: qty 5

## 2014-05-01 MED ORDER — AMLODIPINE BESYLATE 5 MG PO TABS
5.0000 mg | ORAL_TABLET | Freq: Every day | ORAL | Status: DC
  Administered 2014-05-01 – 2014-05-06 (×6): 5 mg via ORAL
  Filled 2014-05-01 (×8): qty 1

## 2014-05-01 MED ORDER — NITROGLYCERIN 0.4 MG SL SUBL: 0.4000 mg | SUBLINGUAL_TABLET | SUBLINGUAL | Status: DC | PRN

## 2014-05-01 MED ORDER — ALBUTEROL SULFATE (2.5 MG/3ML) 0.083% IN NEBU: 2.5000 mg | INHALATION_SOLUTION | RESPIRATORY_TRACT | Status: DC | PRN

## 2014-05-01 MED ORDER — METHYLPREDNISOLONE SODIUM SUCC 125 MG IJ SOLR
125.0000 mg | Freq: Once | INTRAMUSCULAR | Status: AC
  Administered 2014-05-01: 125 mg via INTRAVENOUS
  Filled 2014-05-01: qty 2

## 2014-05-01 MED ORDER — ACETAMINOPHEN 325 MG PO TABS
650.0000 mg | ORAL_TABLET | Freq: Four times a day (QID) | ORAL | Status: DC | PRN
  Administered 2014-05-01 – 2014-05-05 (×5): 650 mg via ORAL
  Filled 2014-05-01 (×6): qty 2

## 2014-05-01 MED ORDER — INSULIN DETEMIR 100 UNIT/ML ~~LOC~~ SOLN
5.0000 [IU] | Freq: Every day | SUBCUTANEOUS | Status: DC
  Administered 2014-05-01 – 2014-05-04 (×4): 5 [IU] via SUBCUTANEOUS
  Filled 2014-05-01 (×4): qty 0.05

## 2014-05-01 MED ORDER — INSULIN ASPART 100 UNIT/ML ~~LOC~~ SOLN: 0.0000 [IU] | Freq: Every day | SUBCUTANEOUS | Status: DC

## 2014-05-01 MED ORDER — IPRATROPIUM-ALBUTEROL 0.5-2.5 (3) MG/3ML IN SOLN
3.0000 mL | Freq: Four times a day (QID) | RESPIRATORY_TRACT | Status: DC
  Administered 2014-05-01 – 2014-05-03 (×9): 3 mL via RESPIRATORY_TRACT
  Filled 2014-05-01 (×9): qty 3

## 2014-05-01 MED ORDER — PIPERACILLIN-TAZOBACTAM 3.375 G IVPB
3.3750 g | Freq: Three times a day (TID) | INTRAVENOUS | Status: DC
  Administered 2014-05-01 (×2): 3.375 g via INTRAVENOUS
  Filled 2014-05-01 (×3): qty 50

## 2014-05-01 MED ORDER — VANCOMYCIN HCL IN DEXTROSE 750-5 MG/150ML-% IV SOLN
750.0000 mg | Freq: Two times a day (BID) | INTRAVENOUS | Status: DC
  Administered 2014-05-01: 750 mg via INTRAVENOUS
  Filled 2014-05-01 (×2): qty 150

## 2014-05-01 MED ORDER — INSULIN ASPART 100 UNIT/ML ~~LOC~~ SOLN
3.0000 [IU] | Freq: Three times a day (TID) | SUBCUTANEOUS | Status: DC
  Administered 2014-05-02 – 2014-05-05 (×10): 3 [IU] via SUBCUTANEOUS

## 2014-05-01 MED ORDER — CEFTRIAXONE SODIUM IN DEXTROSE 20 MG/ML IV SOLN
1.0000 g | INTRAVENOUS | Status: DC
  Administered 2014-05-01 – 2014-05-05 (×5): 1 g via INTRAVENOUS
  Filled 2014-05-01 (×5): qty 50

## 2014-05-01 MED ORDER — ALUM & MAG HYDROXIDE-SIMETH 200-200-20 MG/5ML PO SUSP: 30.0000 mL | Freq: Four times a day (QID) | ORAL | Status: DC | PRN

## 2014-05-01 MED ORDER — ALBUTEROL SULFATE (2.5 MG/3ML) 0.083% IN NEBU
2.5000 mg | INHALATION_SOLUTION | RESPIRATORY_TRACT | Status: DC | PRN
  Administered 2014-05-01: 2.5 mg via RESPIRATORY_TRACT
  Filled 2014-05-01: qty 3

## 2014-05-01 MED ORDER — EZETIMIBE 10 MG PO TABS
10.0000 mg | ORAL_TABLET | Freq: Every day | ORAL | Status: DC
  Administered 2014-05-01 – 2014-05-06 (×6): 10 mg via ORAL
  Filled 2014-05-01 (×6): qty 1

## 2014-05-01 MED ORDER — DEXTROSE 5 % IV SOLN: 1.0000 g | Freq: Two times a day (BID) | INTRAVENOUS | Status: DC

## 2014-05-01 MED ORDER — DOXYCYCLINE HYCLATE 100 MG IV SOLR: 100.0000 mg | Freq: Two times a day (BID) | INTRAVENOUS | Status: DC

## 2014-05-01 MED ORDER — SODIUM CHLORIDE 0.9 % IJ SOLN
3.0000 mL | Freq: Two times a day (BID) | INTRAMUSCULAR | Status: DC
  Administered 2014-05-01 – 2014-05-04 (×6): 3 mL via INTRAVENOUS

## 2014-05-01 MED ORDER — AZITHROMYCIN 500 MG IV SOLR
500.0000 mg | INTRAVENOUS | Status: DC
  Administered 2014-05-01 – 2014-05-05 (×5): 500 mg via INTRAVENOUS
  Filled 2014-05-01 (×5): qty 500

## 2014-05-01 MED ORDER — SODIUM CHLORIDE 0.9 % IV SOLN
INTRAVENOUS | Status: DC
  Administered 2014-05-01 – 2014-05-02 (×3): via INTRAVENOUS

## 2014-05-01 NOTE — H&P (Addendum)
[facility] Admission History and Physical       [name] AUQ:[id number] DOB: [birth year] DOA: 04/30/2014  Referring physician:  PCP: [name], MD  Specialists:   Chief Complaint: Fevers Chills Cough with SOB and Wheezing  HPI: [name] is a 69 y.o. male with Gold C COPD, CAD, HTN, and Hyperlipidemia who presents to the ED with complaints of Fevers chills cough With SOB and Wheezing x 2 days.   He was found to have a temperature to 102 in the ED.   A Sepsis workup was initiated and he was placed on IV Vancomycin and Zosyn.  He was also administered IV Solumedrol x1, and Nebulizer treatments due to his wheezing and referred for medial admission.     Review of Systems:  Constitutional: No Weight Loss, No Weight Gain, Night Sweats, +Fevers, +Chills, Dizziness, Light Headedness, Fatigue, or Generalized Weakness HEENT: No Headaches, Difficulty Swallowing,Tooth/Dental Problems,Sore Throat,  No Sneezing, Rhinitis, Ear Ache, Nasal Congestion, or Post Nasal Drip,  Cardio-vascular:  No Chest pain, Orthopnea, PND, Edema in Lower Extremities, Anasarca, Dizziness, Palpitations  Resp: +Dyspnea, No DOE, No Productive Cough, +Non-Productive Cough, No Hemoptysis, + Wheezing.    GI: No Heartburn, Indigestion, Abdominal Pain, Nausea, Vomiting, Diarrhea, Constipation, Hematemesis, Hematochezia, Melena, Change in Bowel Habits,  Loss of Appetite  GU: No Dysuria, No Change in Color of Urine, No Urgency or Urinary Frequency, No Flank pain.  Musculoskeletal: No Joint Pain or Swelling, No Decreased Range of Motion, No Back Pain.  Neurologic: No Syncope, No Seizures, Muscle Weakness, Paresthesia, Vision Disturbance or Loss, No Diplopia, No Vertigo, No Difficulty Walking,  Skin: No Rash or Lesions. Psych: No Change in Mood or Affect, No Depression or Anxiety, No Memory loss, No Confusion, or Hallucinations   Past Medical History  Diagnosis Date  . COPD (chronic obstructive pulmonary disease)     former smoker  . GERD (gastroesophageal reflux disease)   . CAD (coronary artery disease), native coronary artery 6/11, 2/14, 6/14; 7/15    a. thought due to vasospasm . cath - CAD LAD 40-50%, LCX 40%, RCA 25% EF 65%. lexiscan myoview EF 68% w/o evidence of ischemia or infarct b. STEMI s/p BMS-mid RCA c. BMS x 2- prox RCA, PLOM  . HTN (hypertension)   . Asthma   . Hyperlipidemia   . Tobacco abuse   . Allergic rhinitis      Past Surgical History  Procedure Laterality Date  . Left foot surgery      repair - and ankle   . Coronary angioplasty with stent placement  02/15/12    20% distal LM, 30-40% mid LAD, 50% ostial diag, 50-60% mid LCx, 99% focal mid RCA stenosis s/p BMS; 20% distal RCA disease, 50% prox PLB; LVEF 55-65%  . Coronary angioplasty with stent placement  07/01/12    30% prox LAD, 30% mid LAD, 20% mid LCx, 90% prox RCA s/p BMS, patent mid RCA stent, 30-40% distal RCA, 80% PLB s/p BMS; EF 55-65%  . Cardiac catheterization  08/03/13    non obstructive disease LCX 20%; LAD 20%; stents in prox RCA and mid are patent, mild in stent stenosis POM, 30%, normal EF 55-65%  . Left heart catheterization with coronary angiogram N/A 02/15/2012    Procedure: LEFT HEART CATHETERIZATION WITH CORONARY ANGIOGRAM;  Surgeon: Peter M [name], MD;  Location: [facility] CATH LAB;  Service: Cardiovascular;  Laterality: N/A;  . Percutaneous coronary stent intervention (pci-s) N/A 02/15/2012    Procedure: PERCUTANEOUS CORONARY STENT INTERVENTION (PCI-S);  Surgeon: Peter M [name], MD;  Location: [facility] CATH LAB;  Service: Cardiovascular;  Laterality: N/A;  . Left heart catheterization with coronary angiogram N/A 07/01/2012    Procedure: LEFT HEART CATHETERIZATION WITH CORONARY ANGIOGRAM;  Surgeon: Peter M [name], MD;  Location: [facility] CATH LAB;  Service: Cardiovascular;  Laterality: N/A;  . Percutaneous coronary stent intervention (pci-s)  07/01/2012    Procedure: PERCUTANEOUS CORONARY STENT INTERVENTION (PCI-S);  Surgeon: Peter M  [name], MD;  Location: [facility] CATH LAB;  Service: Cardiovascular;;  . Left heart catheterization with coronary angiogram N/A 08/03/2013    Procedure: LEFT HEART CATHETERIZATION WITH CORONARY ANGIOGRAM;  Surgeon: Peter M [name], MD;  Location: [facility] CATH LAB;  Service: Cardiovascular;  Laterality: N/A;      Prior to Admission medications   Medication Sig Start Date End Date Taking? Authorizing Provider  albuterol (PROVENTIL HFA;VENTOLIN HFA) 108 (90 BASE) MCG/ACT inhaler Inhale 2 puffs into the lungs every 6 (six) hours as needed for wheezing or shortness of breath. Patient taking differently: Inhale 2 puffs into the lungs every 6 (six) hours as needed for wheezing or shortness of breath. For shortness of breath 03/26/14  Yes Elsie Stain, MD  aspirin 81 MG tablet Take 81 mg by mouth daily.    Yes Historical Provider, MD  budesonide-formoterol (SYMBICORT) 160-4.5 MCG/ACT inhaler Inhale 2 puffs into the lungs 2 (two) times daily.   Yes Historical Provider, MD  pantoprazole (PROTONIX) 40 MG tablet Take 40 mg by mouth daily.   Yes Historical Provider, MD  potassium chloride SA (KLOR-CON M20) 20 MEQ tablet Take 1 tablet (20 mEq total) by mouth daily. 10/30/13  Yes Peter M [name], MD  SPIRIVA HANDIHALER 18 MCG inhalation capsule Place 18 mcg into inhaler and inhale daily.  08/18/12  Yes Historical Provider, MD  albuterol (PROVENTIL HFA) 108 (90 BASE) MCG/ACT inhaler 2 puffs twice daily 09/11/13   Elsie Stain, MD  albuterol (PROVENTIL) (2.5 MG/3ML) 0.083% nebulizer solution USE 1 VIAL IN NEBULIZER TWICE A DAY AS NEEDED 09/11/13   Elsie Stain, MD  amLODipine (NORVASC) 5 MG tablet Take 1 tablet by mouth daily.    Historical Provider, MD  Blood Glucose Monitoring Suppl (BLOOD GLUCOSE METER KIT AND SUPPLIES) Dispense based on patient and insurance preference. Use up to four times daily as directed. (FOR ICD-9 250.00, 250.01). 08/31/13   Cherene Altes, MD  ezetimibe (ZETIA) 10 MG tablet Take 1 tablet (10  mg total) by mouth daily. 07/07/13   Peter M [name], MD  losartan (COZAAR) 50 MG tablet Take 1 tablet (50 mg total) by mouth daily. 09/18/13   Elsie Stain, MD  nitroGLYCERIN (NITROSTAT) 0.4 MG SL tablet Place 1 tablet (0.4 mg total) under the tongue every 5 (five) minutes as needed for chest pain. 06/18/12   Peter M [name], MD  PROVENTIL HFA 108 (90 BASE) MCG/ACT inhaler INHALE 2 PUFFS INTO THE LUNGS EVERY 6 HOURS AS NEEDED FOR WHEEZING OR SHORTNESS OF BREATH Patient not taking: Reported on 04/30/2014 01/12/14   Elsie Stain, MD  PROVENTIL HFA 108 (90 BASE) MCG/ACT inhaler INHALE 2 PUFFS INTO THE LUNGS EVERY 6 HOURS AS NEEDED FOR WHEEZING OR SHORTNESS OF BREATH Patient not taking: Reported on 04/30/2014 03/05/14   Elsie Stain, MD     No Known Allergies  Social History:  reports that he quit smoking about 6 years ago. His smoking use included Cigarettes. He has a 50 pack-year smoking history. He has never used smokeless tobacco. He  reports that he does not drink alcohol or use illicit drugs.    History reviewed. No pertinent family history.     Physical Exam:  GEN:  Pleasant Well Nourished and Well Developed  69 y.o. Asian male examined and in no acute distress; cooperative with exam Filed Vitals:   04/30/14 2330 05/01/14 0148 05/01/14 0157 05/01/14 0215  BP: 158/64 152/74  143/75  Pulse: 103 86  87  Temp:   99.9 F (37.7 C) 99 F (37.2 C)  TempSrc:   Oral Oral  Resp: 29 26    Height:    5' 2"  (1.575 m)  Weight:    59.5 kg (131 lb 2.8 oz)  SpO2: 95% 95%  99%   Blood pressure 143/75, pulse 87, temperature 99 F (37.2 C), temperature source Oral, resp. rate 26, height 5' 2"  (1.575 m), weight 59.5 kg (131 lb 2.8 oz), SpO2 99 %. PSYCH: He is alert and oriented x4; does not appear anxious does not appear depressed; affect is normal HEENT: Normocephalic and Atraumatic, Mucous membranes pink; PERRLA; EOM intact; Fundi:  Benign;  No scleral icterus, Nares: Patent, Oropharynx: Clear,  Edentulous,    Neck:  FROM, No Cervical Lymphadenopathy nor Thyromegaly or Carotid Bruit; No JVD; Breasts:: Not examined CHEST WALL: No tenderness CHEST: Normal respiration, clear to auscultation bilaterally HEART: Regular rate and rhythm; no murmurs rubs or gallops BACK: No kyphosis or scoliosis; No CVA tenderness ABDOMEN: Positive Bowel Sounds, Scaphoid, Soft Non-Tender, No Rebound or Guarding; No Masses, No Organomegaly. Rectal Exam: Not done EXTREMITIES: No Cyanosis, Clubbing, or Edema; No Ulcerations. Genitalia: not examined PULSES: 2+ and symmetric SKIN: Normal hydration no rash or ulceration CNS:  Alert and Oriented x 4, No Focal Deficits Vascular: pulses palpable throughout    Labs on Admission:  Basic Metabolic Panel:  Recent Labs Lab 04/30/14 2048  NA 136  K 3.3*  CL 107  CO2 24  GLUCOSE 116*  BUN 13  CREATININE 0.90  CALCIUM 8.3*   Liver Function Tests:  Recent Labs Lab 04/30/14 2048  AST 27  ALT 31  ALKPHOS 72  BILITOT 0.5  PROT 6.6  ALBUMIN 3.7   No results for input(s): LIPASE, AMYLASE in the last 168 hours. No results for input(s): AMMONIA in the last 168 hours. CBC:  Recent Labs Lab 04/30/14 2048  WBC 11.1*  NEUTROABS 7.5  HGB 14.6  HCT 43.8  MCV 89.8  PLT 295   Cardiac Enzymes: No results for input(s): CKTOTAL, CKMB, CKMBINDEX, TROPONINI in the last 168 hours.  BNP (last 3 results) No results for input(s): BNP in the last 8760 hours.  ProBNP (last 3 results)  Recent Labs  08/01/13 1155 08/26/13 1653  PROBNP 60.5 84.4    CBG: No results for input(s): GLUCAP in the last 168 hours.  Radiological Exams on Admission: Dg Chest 2 View  04/30/2014   CLINICAL DATA:  Shortness of breath and productive cough for 2 days  EXAM: CHEST  2 VIEW  COMPARISON:  08/26/2013, chest CT 02/13/2013  FINDINGS: The heart size is at upper limits of normal. Aorta is ectatic. Both lungs are clear. The visualized skeletal structures are unremarkable.  Mild T11 compression deformity is stable.  IMPRESSION: No active cardiopulmonary disease.   Electronically Signed   By: Conchita Paris M.D.   On: 04/30/2014 21:44   Ct Angio Chest Pe W/cm &/or Wo Cm  05/01/2014   CLINICAL DATA:  Shortness of breath. Productive cough. Fever. Code sepsis. Elevated white blood count.  EXAM: CT ANGIOGRAPHY CHEST WITH CONTRAST  TECHNIQUE: Multidetector CT imaging of the chest was performed using the standard protocol during bolus administration of intravenous contrast. Multiplanar CT image reconstructions and MIPs were obtained to evaluate the vascular anatomy.  CONTRAST:  139m OMNIPAQUE IOHEXOL 350 MG/ML SOLN  COMPARISON:  Chest x-ray dated 04/30/2014 and chest CT dated 02/13/2013  FINDINGS: There are no pulmonary emboli. The patient has emphysema with prominent new peribronchial thickening with tiny focal areas of inflammatory disease in both lungs. No consolidation. No effusions. Heart size is normal. There is slight prominence of the ascending thoracic aorta to a diameter of 4 cm.  No osseous abnormality.  : 1. No pulmonary emboli. 2. Emphysema with acute bronchitic changes with tiny patchy areas of parenchymal inflammation bilaterally. 3. 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 Recommend annual imaging followup by CTA or MRA. This recommendation follows 2010 ACCF/AHA/AATS/ACR/ASA/SCA/SCAI/SIR/STS/SVM Guidelines for the Diagnosis and Management of Patients with Thoracic Aortic Disease. Circulation. 2010; 121:: [id number]  Electronically Signed   By: JLorriane ShireM.D.   On: 05/01/2014 00:45     EKG: Independently reviewed. Sinus Tachycardia  At Rate = 117 No Acute S-T changes   Assessment/Plan:   69y.o. male with  Principal Problem:   1.   SIRS (systemic inflammatory response syndrome)   Sepsis workup initiated   IV Vancomycin and Zosyn    Active Problems:   2.   Shortness of breath- due to #1, and  #3   NCO2 PRN   Monitor O2  sats     3.   COPD exacerbation   DuoNebs   IV Steroid Taper   NCO2 PRN   Monitor O2 Sats     4.   CAD (coronary artery disease), native coronary artery- stable   Continue ASA, Losartan and Zetia Rx     5.   Essential hypertension   Continue Amlodipine   Monitor BPs     6.   Hyperlipidemia   Continue Zetia Rx     7.   DVT Prophylaxis   Lovenox          Code Status:     FULL CODE     Family Communication:   No Family Present    Disposition Plan:    Inpatient Status        Time spent:  [facility] Pager [phone]  If 7MercerPlease Contact the Day Rounding Team MD for [facility]  If 7PM-7AM, Please Contact Night-Floor Coverage  [email or web address] Password [email or web address] 05/01/2014, 2:36 AM     ADDENDUM:   Patient was seen and examined on 05/01/2014

## 2014-05-01 NOTE — Progress Notes (Signed)
ANTIBIOTIC CONSULT NOTE - INITIAL  Pharmacy Consult for Vancomycin Indication: Sepsis  No Known Allergies  Patient Measurements: Height: 5' 2"  (157.5 cm) Weight: 131 lb 2.8 oz (59.5 kg) IBW/kg (Calculated) : 54.6   Vital Signs: Temp: 99 F (37.2 C) (04/23 0215) Temp Source: Oral (04/23 0215) BP: 143/75 mmHg (04/23 0215) Pulse Rate: 87 (04/23 0215) Intake/Output from previous day:   Intake/Output from this shift:    Labs:  Recent Labs  04/30/14 2048  WBC 11.1*  HGB 14.6  PLT 295  CREATININE 0.90   Estimated Creatinine Clearance: 59.8 mL/min (by C-G formula based on Cr of 0.9). No results for input(s): VANCOTROUGH, VANCOPEAK, VANCORANDOM, GENTTROUGH, GENTPEAK, GENTRANDOM, TOBRATROUGH, TOBRAPEAK, TOBRARND, AMIKACINPEAK, AMIKACINTROU, AMIKACIN in the last 72 hours.   Microbiology: No results found for this or any previous visit (from the past 720 hour(s)).  Medical History: Past Medical History  Diagnosis Date  . COPD (chronic obstructive pulmonary disease)     former smoker  . GERD (gastroesophageal reflux disease)   . CAD (coronary artery disease), native coronary artery 6/11, 2/14, 6/14; 7/15    a. thought due to vasospasm . cath - CAD LAD 40-50%, LCX 40%, RCA 25% EF 65%. lexiscan myoview EF 68% w/o evidence of ischemia or infarct b. STEMI s/p BMS-mid RCA c. BMS x 2- prox RCA, PLOM  . HTN (hypertension)   . Asthma   . Hyperlipidemia   . Tobacco abuse   . Allergic rhinitis     Medications:  Prescriptions prior to admission  Medication Sig Dispense Refill Last Dose  . albuterol (PROVENTIL HFA;VENTOLIN HFA) 108 (90 BASE) MCG/ACT inhaler Inhale 2 puffs into the lungs every 6 (six) hours as needed for wheezing or shortness of breath. (Patient taking differently: Inhale 2 puffs into the lungs every 6 (six) hours as needed for wheezing or shortness of breath. For shortness of breath) 1 Inhaler 6 04/29/2014 at Unknown time  . aspirin 81 MG tablet Take 81 mg by mouth  daily.    04/30/2014 at Unknown time  . budesonide-formoterol (SYMBICORT) 160-4.5 MCG/ACT inhaler Inhale 2 puffs into the lungs 2 (two) times daily.   04/29/2014 at Unknown time  . pantoprazole (PROTONIX) 40 MG tablet Take 40 mg by mouth daily.   04/30/2014 at Unknown time  . potassium chloride SA (KLOR-CON M20) 20 MEQ tablet Take 1 tablet (20 mEq total) by mouth daily. 30 tablet 6 04/30/2014 at Unknown time  . SPIRIVA HANDIHALER 18 MCG inhalation capsule Place 18 mcg into inhaler and inhale daily.    04/30/2014 at Unknown time  . albuterol (PROVENTIL HFA) 108 (90 BASE) MCG/ACT inhaler 2 puffs twice daily   Taking  . albuterol (PROVENTIL) (2.5 MG/3ML) 0.083% nebulizer solution USE 1 VIAL IN NEBULIZER TWICE A DAY AS NEEDED 375 mL 0 unknown at unknown  . amLODipine (NORVASC) 5 MG tablet Take 1 tablet by mouth daily.   unknown at unknown  . Blood Glucose Monitoring Suppl (BLOOD GLUCOSE METER KIT AND SUPPLIES) Dispense based on patient and insurance preference. Use up to four times daily as directed. (FOR ICD-9 250.00, 250.01). 1 each 0 Taking  . ezetimibe (ZETIA) 10 MG tablet Take 1 tablet (10 mg total) by mouth daily. 30 tablet 6 unknown at unknown  . losartan (COZAAR) 50 MG tablet Take 1 tablet (50 mg total) by mouth daily. 60 tablet 6 unknown at unknown  . nitroGLYCERIN (NITROSTAT) 0.4 MG SL tablet Place 1 tablet (0.4 mg total) under the tongue every 5 (five)  minutes as needed for chest pain. 25 tablet 11 unknown at unknown  . PROVENTIL HFA 108 (90 BASE) MCG/ACT inhaler INHALE 2 PUFFS INTO THE LUNGS EVERY 6 HOURS AS NEEDED FOR WHEEZING OR SHORTNESS OF BREATH (Patient not taking: Reported on 04/30/2014) 6.7 each 5   . PROVENTIL HFA 108 (90 BASE) MCG/ACT inhaler INHALE 2 PUFFS INTO THE LUNGS EVERY 6 HOURS AS NEEDED FOR WHEEZING OR SHORTNESS OF BREATH (Patient not taking: Reported on 04/30/2014) 6.7 Inhaler 5    Scheduled:  . amLODipine  5 mg Oral Daily  . aspirin  81 mg Oral Daily  . enoxaparin (LOVENOX)  injection  40 mg Subcutaneous Q24H  . ezetimibe  10 mg Oral Daily  . ipratropium-albuterol  3 mL Nebulization Q4H  . methylPREDNISolone (SOLU-MEDROL) injection  125 mg Intravenous Once  . piperacillin-tazobactam (ZOSYN)  IV  3.375 g Intravenous 3 times per day  . potassium chloride SA  20 mEq Oral Daily  . sodium chloride  3 mL Intravenous Q12H  . vancomycin  750 mg Intravenous Q12H   Infusions:  . sodium chloride 75 mL/hr at 05/01/14 0246   Assessment: 72 yoM c/o shortness of breath, fever, tachycardia. Meets SIRS criteria. Low oxygen saturation on ABG. No evidence of pneumonia or pulmonary embolism.  Vancomycin per RX and Zosyn per MD for Sepsis.   Goal of Therapy:  Vancomycin trough level 15-20 mcg/ml  Plan:   Vancomycin 1Gm x1 ED then 745m IV q12h   F/u SCr/cultures/levels as needed  GDorrene German4/23/2016,2:56 AM

## 2014-05-01 NOTE — Progress Notes (Signed)
[facility] PROGRESS NOTE    Assessment/Plan: Acute respiratory failure with hypoxia due to  COPD exacerbation/CAP: - Pt's in respiratory distress will give additional albuterol treatment, increase steroids. He has moderate air movement and wheezing bilaterally yet his saturations remained stable on 2 L. - Will scheduele albuterol and start PRN, start Spiriva and cont antibiotics for CAP - Pt is still wheezing will monitor saturation closely. - I have personally review CT chest negative for PE, but showed patchy area of inflammation/intersticial infiltrates.  Sepsis: - Due to community-acquired pneumonia, leukocytosis of improved. - No further fevers.  Essential hypertension - Continue current home regimen.  Hyperlipidemia - cont statins.  Uncontrolled diabetes mellitus without complication - Last hemoglobin A1c in 2015 07.4 ahead and recheck it. - He is on no oral hypoglycemic agents at home, will start him on low-dose Lantus plus sliding scale insulin as he is on steroids and his sugars will continue to trend high.    Code Status: FULL CODE  Family Communication: No Family Present  Disposition Plan:2-3 days    Consultants:  None   Procedures:  CT chest  Antibiotics:  Rocephin/Azithro 4.23.2016  HPI/Subjective: Patient is complaint with no shortness of breath, he relates is unchanged from when he came into the hospital.  Objective: Filed Vitals:   05/01/14 1413 05/01/14 1428 05/01/14 1549 05/01/14 1601  BP:  149/74 173/61   Pulse:  72 100   Temp:  97.6 F (36.4 C) 97.8 F (36.6 C)   TempSrc:  Oral Oral   Resp:  20 28   Height:      Weight:      SpO2: 94% 100% 100% 98%    Intake/Output Summary (Last 24 hours) at 05/01/14 1612 Last data filed at 05/01/14 1300  Gross per 24 hour  Intake 1015.75 ml  Output    700 ml  Net 315.75 ml   Filed Weights   05/01/14 0215  Weight: 59.5 kg (131 lb 2.8 oz)    Exam:  General:  Alert, awake, oriented x3, in no acute distress.  HEENT: No bruits, no goiter. Using accessory muscles to breathe Heart: Regular rate and rhythm. Lungs: Moderate air movement, wheezing bilaterally. Abdomen: Soft, nontender, nondistended, positive bowel sounds.  Neuro: Grossly intact, nonfocal.   Data Reviewed: Basic Metabolic Panel:  Recent Labs Lab 04/30/14 2048 05/01/14 0516  NA 136 139  K 3.3* 3.8  CL 107 106  CO2 24 24  GLUCOSE 116* 168*  BUN 13 12  CREATININE 0.90 0.99  CALCIUM 8.3* 8.2*   Liver Function Tests:  Recent Labs Lab 04/30/14 2048  AST 27  ALT 31  ALKPHOS 72  BILITOT 0.5  PROT 6.6  ALBUMIN 3.7   No results for input(s): LIPASE, AMYLASE in the last 168 hours. No results for input(s): AMMONIA in the last 168 hours. CBC:  Recent Labs Lab 04/30/14 2048 05/01/14 0516  WBC 11.1* 8.5  NEUTROABS 7.5  --   HGB 14.6 14.2  HCT 43.8 43.8  MCV 89.8 90.1  PLT 295 285   Cardiac Enzymes: No results for input(s): CKTOTAL, CKMB, CKMBINDEX, TROPONINI in the last 168 hours. BNP (last 3 results) No results for input(s): BNP in the last 8760 hours.  ProBNP (last 3 results)  Recent Labs  08/01/13 1155 08/26/13 1653  PROBNP 60.5 84.4    CBG: No results for input(s): GLUCAP in the last 168 hours.  No results found for this or any previous visit (from the past 240 hour(s)).  Studies: Dg Chest 2 View  04/30/2014   CLINICAL DATA:  Shortness of breath and productive cough for 2 days  EXAM: CHEST  2 VIEW  COMPARISON:  08/26/2013, chest CT 02/13/2013  FINDINGS: The heart size is at upper limits of normal. Aorta is ectatic. Both lungs are clear. The visualized skeletal structures are unremarkable. Mild T11 compression deformity is stable.  IMPRESSION: No active cardiopulmonary disease.   Electronically Signed   By: Conchita Paris M.D.   On: 04/30/2014 21:44   Ct Angio Chest Pe W/cm &/or Wo Cm  05/01/2014   CLINICAL DATA:  Shortness of breath. Productive  cough. Fever. Code sepsis. Elevated white blood count.  EXAM: CT ANGIOGRAPHY CHEST WITH CONTRAST  TECHNIQUE: Multidetector CT imaging of the chest was performed using the standard protocol during bolus administration of intravenous contrast. Multiplanar CT image reconstructions and MIPs were obtained to evaluate the vascular anatomy.  CONTRAST:  192mL OMNIPAQUE IOHEXOL 350 MG/ML SOLN  COMPARISON:  Chest x-ray dated 04/30/2014 and chest CT dated 02/13/2013  FINDINGS: There are no pulmonary emboli. The patient has emphysema with prominent new peribronchial thickening with tiny focal areas of inflammatory disease in both lungs. No consolidation. No effusions. Heart size is normal. There is slight prominence of the ascending thoracic aorta to a diameter of 4 cm.  No osseous abnormality.  : 1. No pulmonary emboli. 2. Emphysema with acute bronchitic changes with tiny patchy areas of parenchymal inflammation bilaterally. 3. 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 Recommend annual imaging followup by CTA or MRA. This recommendation follows 2010 ACCF/AHA/AATS/ACR/ASA/SCA/SCAI/SIR/STS/SVM Guidelines for the Diagnosis and Management of Patients with Thoracic Aortic Disease. Circulation. 2010; 121: O378-H885   Electronically Signed   By: Lorriane Shire M.D.   On: 05/01/2014 00:45    Scheduled Meds: . amLODipine  5 mg Oral Daily  . aspirin  81 mg Oral Daily  . enoxaparin (LOVENOX) injection  40 mg Subcutaneous Q24H  . ezetimibe  10 mg Oral Daily  . ipratropium-albuterol  3 mL Nebulization QID  . methylPREDNISolone (SOLU-MEDROL) injection  80 mg Intravenous Q12H  . piperacillin-tazobactam (ZOSYN)  IV  3.375 g Intravenous 3 times per day  . potassium chloride SA  20 mEq Oral Daily  . sodium chloride  3 mL Intravenous Q12H  . vancomycin  750 mg Intravenous Q12H   Continuous Infusions: . sodium chloride 75 mL/hr at 05/01/14 0246    Time Spent: 35 min   Charlynne Cousins  [facility] Pager [phone]. If 7PM-7AM, please contact night-coverage at [email or web address], password [facility] 05/01/2014, 4:12 PM  LOS: 0 days

## 2014-05-01 NOTE — Progress Notes (Signed)
Pt in respiratory distress. Noted flushed in the face from  uninterrupted coughing and  unable to catch  his breath. RT notified for prn breathing treatment.  House coverage called to request  MD for pt as ED admitting MD was still listed as attending for pt.  Dr. Olevia Bowens assigned.  Dr. Olevia Bowens then notified of pt's condition. MD on floor and assessed pt. Pt given a round of breathing treatment and another dose of treatment ordered after MD assessed pt. RT notified and treatment given. Vwilliams,rn.

## 2014-05-02 LAB — URINE CULTURE
COLONY COUNT: NO GROWTH
Culture: NO GROWTH

## 2014-05-02 LAB — GLUCOSE, CAPILLARY
GLUCOSE-CAPILLARY: 141 mg/dL — AB (ref 70–99)
GLUCOSE-CAPILLARY: 245 mg/dL — AB (ref 70–99)
GLUCOSE-CAPILLARY: 106 mg/dL — AB (ref 70–99)
Glucose-Capillary: 151 mg/dL — ABNORMAL HIGH (ref 70–99)

## 2014-05-02 NOTE — Progress Notes (Signed)
[facility] PROGRESS NOTE   Interim summary 69 year old male with history of gold see COPD, CAD, HTN, HLD, former heavy tobacco abuse, admitted on 05/01/14 with complaints of fever, chills, cough, dyspnea and wheezing off 2 days duration. In the ED, found to have temperature of 102F, mild leukocytosis, chest x-ray negative for acute findings and CTA chest: No PE, emphysema with acute bronchitic changes with tiny patchy areas of parenchymal inflammation bilaterally. Admitted for further management.  Assessment/Plan: Acute respiratory failure with hypoxia due to COPD exacerbation/?CAP: - Treating empirically with oxygen, IV antibiotics-Rocephin and azithromycin, IV steroids and bronchodilators - Blood cultures 2: Negative to date - Urine culture negative  Sepsis: - Related to acute bronchitis versus? CAP - Has defervesced. Leukocytosis resolved. - Blood cultures 2: Negative to date - Urine culture negative  Essential hypertension - Continue current home regimen. - Controlled  Hyperlipidemia - cont statins.  Uncontrolled diabetes mellitus without complication - Last hemoglobin A1c in 2015: 7.4 - He is on no oral hypoglycemic agents at home, will start him on low-dose Lantus plus sliding scale insulin as he is on steroids and his sugars will continue to trend high. - CBGs fluctuating while on steroids    Code Status: FULL CODE  Family Communication: None at bedside  Disposition Plan:DC home when medically stable-possibly in 48 hours    Consultants:  None   Procedures:  CT chest  Antibiotics:  Rocephin/Azithro 4.23.2016 rater than signed  HPI/Subjective: States that he feels better. Still has cough and some anterior chest pain on coughing. Dyspnea and wheezing have improved.  Objective: Filed Vitals:   05/02/14 0734 05/02/14 0950 05/02/14 1246 05/02/14 1416  BP:  131/64  132/73  Pulse:  101  94  Temp:  97.9 F (36.6 C)  97.9 F (36.6  C)  TempSrc:  Oral  Oral  Resp:  20  20  Height:      Weight:      SpO2: 98% 97% 98% 97%    Intake/Output Summary (Last 24 hours) at 05/02/14 1517 Last data filed at 05/02/14 1300  Gross per 24 hour  Intake 2471.25 ml  Output   1050 ml  Net 1421.25 ml   Filed Weights   05/01/14 0215  Weight: 59.5 kg (131 lb 2.8 oz)    Exam:  General: Alert, awake, oriented x3, in no acute distress.  HEENT: No bruits, no goiter. Using accessory muscles to breathe Heart: Regular rate and rhythm. Telemetry: Sinus rhythm-ST in the 100s. Had brief episode of SVT in the 140s on 4/23 at 8:44 PM Lungs: Reduced breath sounds bilaterally with scattered few medium pitched expiratory rhonchi but no crackles. No increased work of breathing. Able to speak in full sentences. Abdomen: Soft, nontender, nondistended, positive bowel sounds.  Neuro: Grossly intact, nonfocal.   Data Reviewed: Basic Metabolic Panel:  Recent Labs Lab 04/30/14 2048 05/01/14 0516  NA 136 139  K 3.3* 3.8  CL 107 106  CO2 24 24  GLUCOSE 116* 168*  BUN 13 12  CREATININE 0.90 0.99  CALCIUM 8.3* 8.2*   Liver Function Tests:  Recent Labs Lab 04/30/14 2048  AST 27  ALT 31  ALKPHOS 72  BILITOT 0.5  PROT 6.6  ALBUMIN 3.7   No results for input(s): LIPASE, AMYLASE in the last 168 hours. No results for input(s): AMMONIA in the last 168 hours. CBC:  Recent Labs Lab 04/30/14 2048 05/01/14 0516  WBC 11.1* 8.5  NEUTROABS 7.5  --   HGB 14.6  14.2  HCT 43.8 43.8  MCV 89.8 90.1  PLT 295 285   Cardiac Enzymes: No results for input(s): CKTOTAL, CKMB, CKMBINDEX, TROPONINI in the last 168 hours. BNP (last 3 results) No results for input(s): BNP in the last 8760 hours.  ProBNP (last 3 results)  Recent Labs  08/01/13 1155 08/26/13 1653  PROBNP 60.5 84.4    CBG:  Recent Labs Lab 05/01/14 1658 05/01/14 2046 05/02/14 0719  GLUCAP 277* 173* 151*    Recent Results (from the past 240 hour(s))  Culture,  blood (routine x 2)     Status: None (Preliminary result)   Collection Time: 04/30/14  8:50 PM  Result Value Ref Range Status   Specimen Description BLOOD LEFT ANTECUBITAL  Final   Special Requests BOTTLES DRAWN AEROBIC AND ANAEROBIC 5ML  Final   Culture   Final           BLOOD CULTURE RECEIVED NO GROWTH TO DATE CULTURE WILL BE HELD FOR 5 DAYS BEFORE ISSUING A FINAL NEGATIVE REPORT Performed at [facility]    Report Status PENDING  Incomplete  Urine culture     Status: None   Collection Time: 04/30/14  8:58 PM  Result Value Ref Range Status   Specimen Description URINE, CLEAN CATCH  Final   Special Requests NONE  Final   Colony Count NO GROWTH Performed at [facility]   Final   Culture NO GROWTH Performed at [facility]   Final   Report Status 05/02/2014 FINAL  Final  Culture, blood (routine x 2)     Status: None (Preliminary result)   Collection Time: 04/30/14  9:04 PM  Result Value Ref Range Status   Specimen Description BLOOD BLOOD RIGHT FOREARM  Final   Special Requests BOTTLES DRAWN AEROBIC AND ANAEROBIC 5ML  Final   Culture   Final           BLOOD CULTURE RECEIVED NO GROWTH TO DATE CULTURE WILL BE HELD FOR 5 DAYS BEFORE ISSUING A FINAL NEGATIVE REPORT Performed at [facility]    Report Status PENDING  Incomplete     Studies: Dg Chest 2 View  04/30/2014   CLINICAL DATA:  Shortness of breath and productive cough for 2 days  EXAM: CHEST  2 VIEW  COMPARISON:  08/26/2013, chest CT 02/13/2013  FINDINGS: The heart size is at upper limits of normal. Aorta is ectatic. Both lungs are clear. The visualized skeletal structures are unremarkable. Mild T11 compression deformity is stable.  IMPRESSION: No active cardiopulmonary disease.   Electronically Signed   By: Conchita Paris M.D.   On: 04/30/2014 21:44   Ct Angio Chest Pe W/cm &/or Wo Cm  05/01/2014   CLINICAL DATA:  Shortness of breath. Productive cough. Fever. Code sepsis. Elevated white blood  count.  EXAM: CT ANGIOGRAPHY CHEST WITH CONTRAST  TECHNIQUE: Multidetector CT imaging of the chest was performed using the standard protocol during bolus administration of intravenous contrast. Multiplanar CT image reconstructions and MIPs were obtained to evaluate the vascular anatomy.  CONTRAST:  120mL OMNIPAQUE IOHEXOL 350 MG/ML SOLN  COMPARISON:  Chest x-ray dated 04/30/2014 and chest CT dated 02/13/2013  FINDINGS: There are no pulmonary emboli. The patient has emphysema with prominent new peribronchial thickening with tiny focal areas of inflammatory disease in both lungs. No consolidation. No effusions. Heart size is normal. There is slight prominence of the ascending thoracic aorta to a diameter of 4 cm.  No osseous abnormality.  : 1. No  pulmonary emboli. 2. Emphysema with acute bronchitic changes with tiny patchy areas of parenchymal inflammation bilaterally. 3. 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 Recommend annual imaging followup by CTA or MRA. This recommendation follows 2010 ACCF/AHA/AATS/ACR/ASA/SCA/SCAI/SIR/STS/SVM Guidelines for the Diagnosis and Management of Patients with Thoracic Aortic Disease. Circulation. 2010; 121: M353-I144   Electronically Signed   By: Lorriane Shire M.D.   On: 05/01/2014 00:45    Scheduled Meds: . amLODipine  5 mg Oral Daily  . aspirin  81 mg Oral Daily  . azithromycin  500 mg Intravenous Q24H  . budesonide-formoterol  2 puff Inhalation BID  . cefTRIAXone (ROCEPHIN)  IV  1 g Intravenous Q24H  . enoxaparin (LOVENOX) injection  40 mg Subcutaneous Q24H  . ezetimibe  10 mg Oral Daily  . insulin aspart  0-15 Units Subcutaneous TID WC  . insulin aspart  0-5 Units Subcutaneous QHS  . insulin aspart  3 Units Subcutaneous TID WC  . insulin detemir  5 Units Subcutaneous QHS  . ipratropium-albuterol  3 mL Nebulization QID  . methylPREDNISolone (SOLU-MEDROL) injection  80 mg Intravenous Q12H  . potassium chloride SA  20 mEq Oral Daily  .  sodium chloride  3 mL Intravenous Q12H  . tiotropium  18 mcg Inhalation Daily   Continuous Infusions: . sodium chloride 75 mL/hr at 05/02/14 0758    Time Spent: 35 min  Corliss Lamartina, MD, FACP, FHM. [facility] Pager [phone]  If 7PM-7AM, please contact night-coverage [email or web address] Password [email or web address] 05/02/2014, 3:27 PM    LOS: 1 day

## 2014-05-03 DIAGNOSIS — J189 Pneumonia, unspecified organism: Secondary | ICD-10-CM

## 2014-05-03 LAB — HEMOGLOBIN A1C
HEMOGLOBIN A1C: 5.8 % — AB (ref 4.8–5.6)
MEAN PLASMA GLUCOSE: 120 mg/dL

## 2014-05-03 LAB — GLUCOSE, CAPILLARY
GLUCOSE-CAPILLARY: 132 mg/dL — AB (ref 70–99)
Glucose-Capillary: 129 mg/dL — ABNORMAL HIGH (ref 70–99)
Glucose-Capillary: 163 mg/dL — ABNORMAL HIGH (ref 70–99)
Glucose-Capillary: 151 mg/dL — ABNORMAL HIGH (ref 70–99)

## 2014-05-03 MED ORDER — IPRATROPIUM-ALBUTEROL 0.5-2.5 (3) MG/3ML IN SOLN
3.0000 mL | RESPIRATORY_TRACT | Status: DC
  Administered 2014-05-03 – 2014-05-05 (×14): 3 mL via RESPIRATORY_TRACT
  Filled 2014-05-03 (×16): qty 3

## 2014-05-03 MED ORDER — BENZONATATE 100 MG PO CAPS
200.0000 mg | ORAL_CAPSULE | Freq: Three times a day (TID) | ORAL | Status: DC
  Administered 2014-05-03 – 2014-05-06 (×10): 200 mg via ORAL
  Filled 2014-05-03 (×13): qty 2

## 2014-05-03 MED ORDER — GUAIFENESIN-DM 100-10 MG/5ML PO SYRP
10.0000 mL | ORAL_SOLUTION | ORAL | Status: DC | PRN
  Administered 2014-05-03 – 2014-05-05 (×5): 10 mL via ORAL
  Filled 2014-05-03 (×5): qty 10

## 2014-05-03 MED ORDER — GUAIFENESIN-DM 100-10 MG/5ML PO SYRP
5.0000 mL | ORAL_SOLUTION | ORAL | Status: DC | PRN
  Administered 2014-05-03 (×2): 5 mL via ORAL
  Filled 2014-05-03 (×2): qty 10

## 2014-05-03 MED ORDER — METHYLPREDNISOLONE SODIUM SUCC 125 MG IJ SOLR
60.0000 mg | Freq: Four times a day (QID) | INTRAMUSCULAR | Status: DC
  Administered 2014-05-03 – 2014-05-05 (×7): 60 mg via INTRAVENOUS
  Filled 2014-05-03 (×10): qty 0.96

## 2014-05-03 NOTE — Progress Notes (Signed)
[facility] PROGRESS NOTE   Interim summary 69 year old male with history of gold see COPD, CAD, HTN, HLD, former heavy tobacco abuse, admitted on 05/01/14 with complaints of fever, chills, cough, dyspnea and wheezing off 2 days duration. In the ED, found to have temperature of 102F, mild leukocytosis, chest x-ray negative for acute findings and CTA chest: No PE, emphysema with acute bronchitic changes with tiny patchy areas of parenchymal inflammation bilaterally. Admitted for further management.  Assessment/Plan: Acute respiratory failure with hypoxia due to COPD exacerbation/?CAP: - Treating empirically with oxygen, IV antibiotics-Rocephin and azithromycin, IV steroids and bronchodilators - Blood cultures 2: Negative to date - Urine culture negative - Complains of hacking dry cough overnight 4/23 associated with dyspnea and chest pain - Still with clinical bronchospasm. Increase Solu-Medrol to 60 mg every 6 hourly - Added Tessalon Perles - May take another couple of days to settle. If does not improve in next 24-48 hours, consider Pulmonology consultation. - Extensive tobacco abuse history in [place] (rolled and smoked tobacco leaves).  Sepsis: - Related to acute bronchitis versus? CAP - Has defervesced. Leukocytosis resolved. - Blood cultures 2: Negative to date - Urine culture negative - Sepsis physiology resolved  Essential hypertension - Continue current home regimen. - Controlled  Hyperlipidemia - cont statins.  Uncontrolled diabetes mellitus without complication - Last hemoglobin A1c in 2015: 7.4 - He is not on oral hypoglycemic agents at home. - Started on low-dose Lantus plus sliding scale insulin as he is on steroids. - CBGs fluctuating while on steroids    Code Status: FULL CODE  Family Communication: None at bedside  Disposition Plan:DC home when medically stable-possibly in 48 hours    Consultants:  None   Procedures:  CT  chest  Antibiotics:  IV Rocephin 4/23 >  IV azithromycin 4/23 >  HPI/Subjective: Complained of dry hacking cough overnight associated with dyspnea, wheezing and chest pain with coughing. Does not feel too well this morning.  Objective: Filed Vitals:   05/03/14 0519 05/03/14 0747 05/03/14 0920 05/03/14 1217  BP: 150/75  144/68   Pulse: 91  95   Temp: 98.1 F (36.7 C)  98.1 F (36.7 C)   TempSrc: Oral  Oral   Resp: 20  20   Height:      Weight:      SpO2: 99% 96% 100% 98%    Intake/Output Summary (Last 24 hours) at 05/03/14 1243 Last data filed at 05/03/14 1230  Gross per 24 hour  Intake   1502 ml  Output   1825 ml  Net   -323 ml   Filed Weights   05/01/14 0215  Weight: 59.5 kg (131 lb 2.8 oz)    Exam:  General: Alert, awake, oriented x3, in no acute distress.  Heart: S1 and S2 heard, RRR. No JVD, murmurs or pedal edema. Telemetry: Sinus rhythm-ST in the 100s. Had brief episode of SVT in the 140s on 4/23 at 8:44 PM-no recurrence. Telemetry DC'd 4/25 Lungs: Reduced breath sounds bilaterally with scattered few medium pitched expiratory rhonchi but no crackles. No increased work of breathing. Able to speak in full sentences. Abdomen: Soft, nontender, nondistended, positive bowel sounds.  Neuro: Grossly intact, nonfocal.   Data Reviewed: Basic Metabolic Panel:  Recent Labs Lab 04/30/14 2048 05/01/14 0516  NA 136 139  K 3.3* 3.8  CL 107 106  CO2 24 24  GLUCOSE 116* 168*  BUN 13 12  CREATININE 0.90 0.99  CALCIUM 8.3* 8.2*   Liver Function Tests:  Recent Labs Lab 04/30/14 2048  AST 27  ALT 31  ALKPHOS 72  BILITOT 0.5  PROT 6.6  ALBUMIN 3.7   No results for input(s): LIPASE, AMYLASE in the last 168 hours. No results for input(s): AMMONIA in the last 168 hours. CBC:  Recent Labs Lab 04/30/14 2048 05/01/14 0516  WBC 11.1* 8.5  NEUTROABS 7.5  --   HGB 14.6 14.2  HCT 43.8 43.8  MCV 89.8 90.1  PLT 295 285   Cardiac Enzymes: No results for  input(s): CKTOTAL, CKMB, CKMBINDEX, TROPONINI in the last 168 hours. BNP (last 3 results) No results for input(s): BNP in the last 8760 hours.  ProBNP (last 3 results)  Recent Labs  08/01/13 1155 08/26/13 1653  PROBNP 60.5 84.4    CBG:  Recent Labs Lab 05/02/14 0719 05/02/14 1152 05/02/14 1720 05/02/14 2133 05/03/14 0733  GLUCAP 151* 141* 245* 106* 129*    Recent Results (from the past 240 hour(s))  Culture, blood (routine x 2)     Status: None (Preliminary result)   Collection Time: 04/30/14  8:50 PM  Result Value Ref Range Status   Specimen Description BLOOD LEFT ANTECUBITAL  Final   Special Requests BOTTLES DRAWN AEROBIC AND ANAEROBIC 5ML  Final   Culture   Final           BLOOD CULTURE RECEIVED NO GROWTH TO DATE CULTURE WILL BE HELD FOR 5 DAYS BEFORE ISSUING A FINAL NEGATIVE REPORT Performed at [facility]    Report Status PENDING  Incomplete  Urine culture     Status: None   Collection Time: 04/30/14  8:58 PM  Result Value Ref Range Status   Specimen Description URINE, CLEAN CATCH  Final   Special Requests NONE  Final   Colony Count NO GROWTH Performed at [facility]   Final   Culture NO GROWTH Performed at [facility]   Final   Report Status 05/02/2014 FINAL  Final  Culture, blood (routine x 2)     Status: None (Preliminary result)   Collection Time: 04/30/14  9:04 PM  Result Value Ref Range Status   Specimen Description BLOOD BLOOD RIGHT FOREARM  Final   Special Requests BOTTLES DRAWN AEROBIC AND ANAEROBIC 5ML  Final   Culture   Final           BLOOD CULTURE RECEIVED NO GROWTH TO DATE CULTURE WILL BE HELD FOR 5 DAYS BEFORE ISSUING A FINAL NEGATIVE REPORT Performed at [facility]    Report Status PENDING  Incomplete     Studies: No results found.  Scheduled Meds: . amLODipine  5 mg Oral Daily  . aspirin  81 mg Oral Daily  . azithromycin  500 mg Intravenous Q24H  . benzonatate  200 mg Oral TID  .  budesonide-formoterol  2 puff Inhalation BID  . cefTRIAXone (ROCEPHIN)  IV  1 g Intravenous Q24H  . enoxaparin (LOVENOX) injection  40 mg Subcutaneous Q24H  . ezetimibe  10 mg Oral Daily  . insulin aspart  0-15 Units Subcutaneous TID WC  . insulin aspart  0-5 Units Subcutaneous QHS  . insulin aspart  3 Units Subcutaneous TID WC  . insulin detemir  5 Units Subcutaneous QHS  . ipratropium-albuterol  3 mL Nebulization Q4H  . methylPREDNISolone (SOLU-MEDROL) injection  60 mg Intravenous Q6H  . potassium chloride SA  20 mEq Oral Daily  . sodium chloride  3 mL Intravenous Q12H   Continuous Infusions:    Time Spent:  70 min  Mahalie Kanner, MD, FACP, FHM. [facility] Pager [phone]  If 7PM-7AM, please contact night-coverage [email or web address] Password [facility] 05/03/2014, 12:43 PM    LOS: 2 days

## 2014-05-04 LAB — RESPIRATORY VIRUS PANEL
Adenovirus: NEGATIVE
Influenza A: NEGATIVE
Influenza B: NEGATIVE
Metapneumovirus: NEGATIVE
Parainfluenza 1: NEGATIVE
Parainfluenza 2: NEGATIVE
Parainfluenza 3: NEGATIVE
RHINOVIRUS: NEGATIVE
Respiratory Syncytial Virus A: NEGATIVE
Respiratory Syncytial Virus B: NEGATIVE

## 2014-05-04 LAB — GLUCOSE, CAPILLARY
GLUCOSE-CAPILLARY: 134 mg/dL — AB (ref 70–99)
Glucose-Capillary: 193 mg/dL — ABNORMAL HIGH (ref 70–99)
Glucose-Capillary: 98 mg/dL (ref 70–99)
Glucose-Capillary: 181 mg/dL — ABNORMAL HIGH (ref 70–99)

## 2014-05-04 NOTE — Progress Notes (Signed)
[facility] PROGRESS NOTE   Interim summary 69 year old male with history of gold see COPD, CAD, HTN, HLD, former heavy tobacco abuse, admitted on 05/01/14 with complaints of fever, chills, cough, dyspnea and wheezing off 2 days duration. In the ED, found to have temperature of 102F, mild leukocytosis, chest x-ray negative for acute findings and CTA chest: No PE, emphysema with acute bronchitic changes with tiny patchy areas of parenchymal inflammation bilaterally. Admitted for further management. COPD exacerbation has been slow to resolve.  Assessment/Plan: Acute respiratory failure with hypoxia due to COPD exacerbation/?CAP: - Treating empirically with oxygen, IV antibiotics-Rocephin and azithromycin, IV steroids and bronchodilators - Chest x-ray was negative for acute findings. CTA chest showed tiny patchy areas of parenchymal inflammation bilaterally. No PE on CTA chest. - Blood cultures 2: Negative to date - Urine culture negative - Complained of hacking dry cough overnight 4/23 associated with dyspnea and chest pain - Still with clinical bronchospasm & increased Solu-Medrol to 60 mg every 6 hourly on 4/25 - Added Tessalon Perles - May take another couple of days to settle. - Extensive tobacco abuse history in [place] (rolled and smoked tobacco leaves). - Overall better compared to yesterday. Continue current regimen for additional 24 hours then taper down steroids  Sepsis: - Related to acute bronchitis versus? CAP - Has defervesced. Leukocytosis resolved. - Blood cultures 2: Negative to date - Urine culture negative - Sepsis physiology resolved  Essential hypertension - Continue current home regimen. - Controlled  Hyperlipidemia - cont statins.  Uncontrolled diabetes mellitus without complication - Last hemoglobin A1c in 2015: 7.4 - He is not on oral hypoglycemic agents at home. - Started on low-dose Lantus plus sliding scale insulin as he is on steroids. -  CBGs fluctuating while on steroids  Dilatation of the ascending thoracic aorta to a diameter of 4 cm. - Reported on CTA chest. - As per recommendations, annual CTA or MRA follow-up.    Code Status: FULL CODE  Family Communication: None at bedside  Disposition Plan:DC home when medically stable-possibly in 48 hours    Consultants:  None   Procedures:  CT chest  Antibiotics:  IV Rocephin 4/23 >  IV azithromycin 4/23 >  HPI/Subjective: Cough and dyspnea are better. Some anterior chest pain on coughing.  Objective: Filed Vitals:   05/04/14 0646 05/04/14 0738 05/04/14 1059 05/04/14 1146  BP: 140/83  139/71   Pulse: 92     Temp: 98.2 F (36.8 C)     TempSrc: Oral     Resp: 18     Height:      Weight:      SpO2: 98% 97%  100%    Intake/Output Summary (Last 24 hours) at 05/04/14 1315 Last data filed at 05/04/14 1105  Gross per 24 hour  Intake    903 ml  Output    800 ml  Net    103 ml   Filed Weights   05/01/14 0215  Weight: 59.5 kg (131 lb 2.8 oz)    Exam:  General: Alert, awake, oriented x3, in no acute distress.  Heart: S1 and S2 heard, RRR. No JVD, murmurs or pedal edema.  Lungs: Breath sounds much improved bilaterally. Occasional rhonchi posteriorly. No increased work of breathing. Able to speak in full sentences. Abdomen: Soft, nontender, nondistended, positive bowel sounds.  Neuro: Grossly intact, nonfocal.   Data Reviewed: Basic Metabolic Panel:  Recent Labs Lab 04/30/14 2048 05/01/14 0516  NA 136 139  K 3.3* 3.8  CL  107 106  CO2 24 24  GLUCOSE 116* 168*  BUN 13 12  CREATININE 0.90 0.99  CALCIUM 8.3* 8.2*   Liver Function Tests:  Recent Labs Lab 04/30/14 2048  AST 27  ALT 31  ALKPHOS 72  BILITOT 0.5  PROT 6.6  ALBUMIN 3.7   No results for input(s): LIPASE, AMYLASE in the last 168 hours. No results for input(s): AMMONIA in the last 168 hours. CBC:  Recent Labs Lab 04/30/14 2048 05/01/14 0516  WBC 11.1*  8.5  NEUTROABS 7.5  --   HGB 14.6 14.2  HCT 43.8 43.8  MCV 89.8 90.1  PLT 295 285   Cardiac Enzymes: No results for input(s): CKTOTAL, CKMB, CKMBINDEX, TROPONINI in the last 168 hours. BNP (last 3 results) No results for input(s): BNP in the last 8760 hours.  ProBNP (last 3 results)  Recent Labs  08/01/13 1155 08/26/13 1653  PROBNP 60.5 84.4    CBG:  Recent Labs Lab 05/03/14 1229 05/03/14 1734 05/03/14 2112 05/04/14 0722 05/04/14 1135  GLUCAP 132* 163* 151* 193* 134*    Recent Results (from the past 240 hour(s))  Culture, blood (routine x 2)     Status: None (Preliminary result)   Collection Time: 04/30/14  8:50 PM  Result Value Ref Range Status   Specimen Description BLOOD LEFT ANTECUBITAL  Final   Special Requests BOTTLES DRAWN AEROBIC AND ANAEROBIC 5ML  Final   Culture   Final           BLOOD CULTURE RECEIVED NO GROWTH TO DATE CULTURE WILL BE HELD FOR 5 DAYS BEFORE ISSUING A FINAL NEGATIVE REPORT Performed at [facility]    Report Status PENDING  Incomplete  Urine culture     Status: None   Collection Time: 04/30/14  8:58 PM  Result Value Ref Range Status   Specimen Description URINE, CLEAN CATCH  Final   Special Requests NONE  Final   Colony Count NO GROWTH Performed at [facility]   Final   Culture NO GROWTH Performed at [facility]   Final   Report Status 05/02/2014 FINAL  Final  Culture, blood (routine x 2)     Status: None (Preliminary result)   Collection Time: 04/30/14  9:04 PM  Result Value Ref Range Status   Specimen Description BLOOD BLOOD RIGHT FOREARM  Final   Special Requests BOTTLES DRAWN AEROBIC AND ANAEROBIC 5ML  Final   Culture   Final           BLOOD CULTURE RECEIVED NO GROWTH TO DATE CULTURE WILL BE HELD FOR 5 DAYS BEFORE ISSUING A FINAL NEGATIVE REPORT Performed at [facility]    Report Status PENDING  Incomplete     Studies: No results found.  Scheduled Meds: . amLODipine  5 mg Oral  Daily  . aspirin  81 mg Oral Daily  . azithromycin  500 mg Intravenous Q24H  . benzonatate  200 mg Oral TID  . budesonide-formoterol  2 puff Inhalation BID  . cefTRIAXone (ROCEPHIN)  IV  1 g Intravenous Q24H  . enoxaparin (LOVENOX) injection  40 mg Subcutaneous Q24H  . ezetimibe  10 mg Oral Daily  . insulin aspart  0-15 Units Subcutaneous TID WC  . insulin aspart  0-5 Units Subcutaneous QHS  . insulin aspart  3 Units Subcutaneous TID WC  . insulin detemir  5 Units Subcutaneous QHS  . ipratropium-albuterol  3 mL Nebulization Q4H  . methylPREDNISolone (SOLU-MEDROL) injection  60 mg Intravenous Q6H  .  potassium chloride SA  20 mEq Oral Daily  . sodium chloride  3 mL Intravenous Q12H   Continuous Infusions:    Time Spent: 25 min  Carra Brindley, MD, FACP, FHM. [facility] Pager [phone]  If 7PM-7AM, please contact night-coverage [email or web address] Password [email or web address] 05/04/2014, 1:15 PM    LOS: 3 days

## 2014-05-04 NOTE — Progress Notes (Signed)
Respiratory panel results came back negative. Droplet precautions D/C'ed. Patient notified.

## 2014-05-04 NOTE — Progress Notes (Signed)
Taking over care of patient. Agree with previous RN assessment. Patient resting comfortably at this time will continue to monitor.

## 2014-05-04 NOTE — Care Management Note (Addendum)
    Page 1 of 1   05/06/2014     9:29:22 AM CARE MANAGEMENT NOTE 05/06/2014  Patient:  [name], [name]   Account Number:  [id number]  Date Initiated:  05/01/2014  Documentation initiated by:  Dessa Phi  Subjective/Objective Assessment:   69 y/o m admitted w/SIRS.     Action/Plan:   From home w/support.Has pcp,pharmacy.   Anticipated DC Date:  05/06/2014   Anticipated DC Plan:  [place]  CM consult      Choice offered to / List presented to:             Status of service:  Completed, signed off Medicare Important Message given?   (If response is "NO", the following Medicare IM given date fields will be blank) Date Medicare IM given:   Medicare IM given by:   Date Additional Medicare IM given:   Additional Medicare IM given by:    Discharge Disposition:  HOME/SELF CARE  Per UR Regulation:  Reviewed for med. necessity/level of care/duration of stay  If discussed at [place] of Stay Meetings, dates discussed:    Comments:  05/06/14 Dessa Phi RN BSN NCM [phone] d/c home no needs or orders.  05/04/14 Dessa Phi RN BSN NCM [phone] iv abx,iv solumedrol,nebs,on ra.No anticipated d/c needs.

## 2014-05-05 LAB — BASIC METABOLIC PANEL
Anion gap: 8 (ref 5–15)
BUN: 21 mg/dL (ref 6–23)
CALCIUM: 8 mg/dL — AB (ref 8.4–10.5)
CO2: 28 mmol/L (ref 19–32)
Chloride: 100 mmol/L (ref 96–112)
Creatinine, Ser: 0.86 mg/dL (ref 0.50–1.35)
GFR calc Af Amer: >90 mL/min (ref >90)
GFR, EST NON AFRICAN AMERICAN: 86 mL/min — AB (ref >90)
GLUCOSE: 163 mg/dL — AB (ref 70–99)
Potassium: 4 mmol/L (ref 3.5–5.1)
Sodium: 136 mmol/L (ref 135–145)

## 2014-05-05 LAB — GLUCOSE, CAPILLARY
GLUCOSE-CAPILLARY: 268 mg/dL — AB (ref 70–99)
GLUCOSE-CAPILLARY: 112 mg/dL — AB (ref 70–99)
Glucose-Capillary: 106 mg/dL — ABNORMAL HIGH (ref 70–99)
Glucose-Capillary: 163 mg/dL — ABNORMAL HIGH (ref 70–99)

## 2014-05-05 MED ORDER — METHYLPREDNISOLONE SODIUM SUCC 125 MG IJ SOLR
60.0000 mg | Freq: Every day | INTRAMUSCULAR | Status: DC
  Filled 2014-05-05: qty 0.96

## 2014-05-05 MED ORDER — IPRATROPIUM-ALBUTEROL 0.5-2.5 (3) MG/3ML IN SOLN: 3.0000 mL | RESPIRATORY_TRACT | Status: DC | PRN

## 2014-05-05 NOTE — Progress Notes (Addendum)
Patient ID: [name], male   DOB: 11-15-1945, 69 y.o.   MRN: [id number] [facility] PROGRESS NOTE  [name] SWF:[id number] DOB: [birth year] DOA: 04/30/2014 PCP: [name], MD  Brief narrative:    69 year old male with past medical history of COPD, hypertension, dyslipidemia, former smoker who presented to [facility] ED with worsening shortness of breath, wheezing, productive cough of yellow sputum and fevers for 2 days prior to this admission.   On admission, pt was febri with T max 102 . CXR showed no acute cardiopulmonary findings. CT angio of the chest showed no pulmonary embolism but it did show acute bronchitis changes with tiny patchy areas of parenchymal inflammation bilaterally. Pt was started on azithromycin and rocephin for treatment of pneumonia. He was also admitted for COPD exacerbation management.   Barrier to discharge: still short of breath so we will observe for next 24 hours and if he feels better in am then D/C tomorrow.   Assessment/Plan:    Principal Problem: Acute respiratory failure with hypoxia / Lobar pneumonia due to unspecified organism / Acute COPD exacerbation  - Pt admitted with hypoxia likely related to combination of acute COPD exacerbation as well as possible pneumonia considering fevers, productive cough - CXR on admission showed no acute cardiopulmonary findings - CT angiogram of the chest showed no pulmonary embolism but it did show acute bronchitis changes with tiny patchy areas of parenchymal inflammation bilaterally. - Pt was started on empiric rocephin and azithromycin for pneumonia. Blood cultures to date are negative. Resp virus panel negative.  - Pt was started on solumedrol. His current regimen is scheduled solu-medrol every 6 hours but since no rhonchi or crackles or wheezing this am will cut down to every 24 hours. - Will make sligh change to neb regimen. Leave duoneb every 4 hours sch and add duoneb every 2 hours as needed for shortness of  breath or wheezing. May hold Symbicort while on nebs. - Continue oxygen support via [place] to keep O2 sats above 90% - Continue antitussives: tessalon pearls TID; robitussin PRN cough  Active Problems: Sepsis due to pneumonia / Leukocytosis - Sepsis protocol initiated on admission. Source of infection - pneumonia, seen on CT angio chest. Lactic acid was WNL and proalcitonin 0.5. Pt on azithromycin and rocephin for targeted treatment of pneumonia.  - Blood cultures to date are negative. Urine culture negative.  - Leukocytosis resolved.  Essential hypertension - Continue Norvasc 5 mg daily   Hyperlipidemia - Continue ezetimibe 10 mg daily.  Diabetes mellitus without complication / Steroid induced hyperglycemia - A1c on this admission 5.8.  - Hyperglycemia likely exacerbated with steroids - Current insulin regimen Levemir 5 units at bedtime and novlog 3 units TID. Since will reduce steroids to once a day and likely no steroids on d/c will stop long and short acting insulin and use SSI. CBG's in past 24 hours: 98 - 268. - Pt is not on any diabetic meds at home but since A1c controlled will see how CBG's are controlled innext day or so and will make recommendations then.   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 - Reported on CTA chest. - As per recommendations, annual CTA or MRA follow-up.  Hypokalemia - Likely due to nebs - Supplemented and he is on daily potassium regimen so will check BMP today.   History of CAD - Stable - Continue aspirin daily.     DVT Prophylaxis  - Lovenox subQ ordered    Code  Status: Full.  Family Communication:  plan of care discussed with the patient Disposition Plan: Home likely in next 24 hours if shortness of breath improves.   IV access:  Peripheral IV  Procedures and diagnostic studies:    Dg Chest 2 View 04/30/2014  No active cardiopulmonary disease.   Electronically Signed   By: Conchita Paris M.D.   On: 04/30/2014  21:44   Ct Angio Chest Pe W/cm &/or Wo Cm 05/01/2014   There are no pulmonary emboli. The patient has emphysema with prominent new peribronchial thickening with tiny focal areas of inflammatory disease in both lungs. No consolidation. No effusions. Heart size is normal. There is slight prominence of the ascending thoracic aorta to a diameter of 4 cm.  No osseous abnormality.  : 1. No pulmonary emboli. 2. Emphysema with acute bronchitic changes with tiny patchy areas of parenchymal inflammation bilaterally. 3. 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 Recommend annual imaging followup by CTA or MRA. This recommendation follows 2010 ACCF/AHA/AATS/ACR/ASA/SCA/SCAI/SIR/STS/SVM Guidelines for the Diagnosis and Management of Patients with Thoracic Aortic Disease. Circulation. 2010  Medical Consultants:  None   Other Consultants:  Physical therapy   IAnti-Infectives:   Rocephin 05/01/14 -- > Azithromycin 05/01/14 -- >   [name] Hornback, MD  [facility] Pager [phone]  Time spent in minutes: 25 minutes  If 7PM-7AM, please contact night-coverage [email or web address] Password [email or web address] 05/05/2014, 11:08 AM   LOS: 4 days    HPI/Subjective: No acute overnight events. Patient reports still feeling short of breath this am. No significant coughing.   Objective: Filed Vitals:   05/04/14 1932 05/04/14 2145 05/05/14 0615 05/05/14 0749  BP:  129/70 135/71   Pulse:  94 84   Temp:  98.5 F (36.9 C) 97.7 F (36.5 C)   TempSrc:  Oral Oral   Resp:  20 18   Height:      Weight:      SpO2: 93% 93% 96% 97%    Intake/Output Summary (Last 24 hours) at 05/05/14 1108 Last data filed at 05/05/14 0945  Gross per 24 hour  Intake   1260 ml  Output   1500 ml  Net   -240 ml    Exam:   General:  Pt is alert, follows commands appropriately, not in acute distress  Cardiovascular: Regular rate and rhythm, S1/S2, no murmurs  Respiratory: No wheezing, no crackles, no  rhonchi  Abdomen: Soft, non tender, non distended, bowel sounds present  Extremities: No edema, pulses DP and PT palpable bilaterally  Neuro: Grossly nonfocal  Data Reviewed: Basic Metabolic Panel:  Recent Labs Lab 04/30/14 2048 05/01/14 0516  NA 136 139  K 3.3* 3.8  CL 107 106  CO2 24 24  GLUCOSE 116* 168*  BUN 13 12  CREATININE 0.90 0.99  CALCIUM 8.3* 8.2*   Liver Function Tests:  Recent Labs Lab 04/30/14 2048  AST 27  ALT 31  ALKPHOS 72  BILITOT 0.5  PROT 6.6  ALBUMIN 3.7   No results for input(s): LIPASE, AMYLASE in the last 168 hours. No results for input(s): AMMONIA in the last 168 hours. CBC:  Recent Labs Lab 04/30/14 2048 05/01/14 0516  WBC 11.1* 8.5  NEUTROABS 7.5  --   HGB 14.6 14.2  HCT 43.8 43.8  MCV 89.8 90.1  PLT 295 285   Cardiac Enzymes: No results for input(s): CKTOTAL, CKMB, CKMBINDEX, TROPONINI in the last 168 hours. BNP: Invalid input(s): POCBNP CBG:  Recent Labs Lab 05/04/14  2703 05/04/14 1135 05/04/14 1713 05/04/14 2148 05/05/14 0748  GLUCAP 193* 134* 98 181* 268*    Culture, blood (routine x 2)     Status: None (Preliminary result)   Collection Time: 04/30/14  8:50 PM  Result Value Ref Range Status   Specimen Description BLOOD LEFT ANTECUBITAL  Final   Special Requests BOTTLES DRAWN AEROBIC AND ANAEROBIC 5ML  Final   Culture   Final           BLOOD CULTURE RECEIVED NO GROWTH TO DATE CULTURE     Report Status PENDING  Incomplete  Urine culture     Status: None   Collection Time: 04/30/14  8:58 PM  Result Value Ref Range Status   Specimen Description URINE, CLEAN CATCH  Final   Special Requests NONE  Final   Culture NO GROWTH  Final   Report Status 05/02/2014 FINAL  Final  Culture, blood (routine x 2)     Status: None (Preliminary result)   Collection Time: 04/30/14  9:04 PM  Result Value Ref Range Status   Specimen Description BLOOD BLOOD RIGHT FOREARM  Final   Special Requests BOTTLES DRAWN AEROBIC AND  ANAEROBIC 5ML  Final   Culture   Final           BLOOD CULTURE RECEIVED NO GROWTH TO DATE    Report Status PENDING  Incomplete  Respiratory virus panel     Status: None   Collection Time: 05/01/14  3:08 AM  Result Value Ref Range Status   Source - RVPAN NOSE  Corrected   Respiratory Syncytial Virus A Negative Negative Final   Respiratory Syncytial Virus B Negative Negative Final   Influenza A Negative Negative Final   Influenza B Negative Negative Final   Parainfluenza 1 Negative Negative Final   Parainfluenza 2 Negative Negative Final   Parainfluenza 3 Negative Negative Final   Metapneumovirus Negative Negative Final   Rhinovirus Negative Negative Final   Adenovirus Negative Negative Final     Scheduled Meds: . amLODipine  5 mg Oral Daily  . aspirin  81 mg Oral Daily  . azithromycin  500 mg Intravenous Q24H  . benzonatate  200 mg Oral TID  . budesonide-formoterol  2 puff Inhalation BID  . cefTRIAXone   1 g Intravenous Q24H  . enoxaparin (LOVENOX) injection  40 mg Subcutaneous Q24H  . ezetimibe  10 mg Oral Daily  . insulin aspart  0-15 Units Subcutaneous TID WC  . insulin aspart  0-5 Units Subcutaneous QHS  . insulin aspart  3 Units Subcutaneous TID WC  . insulin detemir  5 Units Subcutaneous QHS  . ipratropium-albuterol  3 mL Nebulization Q4H  . methylPREDNISolone   60 mg Intravenous Q6H  . potassium chloride SA  20 mEq Oral Daily

## 2014-05-06 LAB — GLUCOSE, CAPILLARY: GLUCOSE-CAPILLARY: 109 mg/dL — AB (ref 70–99)

## 2014-05-06 MED ORDER — LEVOFLOXACIN 500 MG PO TABS: 500.0000 mg | ORAL_TABLET | Freq: Every day | ORAL | Status: DC

## 2014-05-06 MED ORDER — BUDESONIDE-FORMOTEROL FUMARATE 160-4.5 MCG/ACT IN AERO: 2.0000 | INHALATION_SPRAY | Freq: Two times a day (BID) | RESPIRATORY_TRACT | Status: DC

## 2014-05-06 MED ORDER — ALBUTEROL SULFATE HFA 108 (90 BASE) MCG/ACT IN AERS: 2.0000 | INHALATION_SPRAY | Freq: Two times a day (BID) | RESPIRATORY_TRACT | Status: AC

## 2014-05-06 MED ORDER — AMLODIPINE BESYLATE 5 MG PO TABS: 5.0000 mg | ORAL_TABLET | Freq: Every day | ORAL | Status: DC

## 2014-05-06 MED ORDER — ALBUTEROL SULFATE HFA 108 (90 BASE) MCG/ACT IN AERS: 2.0000 | INHALATION_SPRAY | Freq: Four times a day (QID) | RESPIRATORY_TRACT | Status: DC | PRN

## 2014-05-06 MED ORDER — BENZONATATE 200 MG PO CAPS: 200.0000 mg | ORAL_CAPSULE | Freq: Two times a day (BID) | ORAL | Status: DC | PRN

## 2014-05-06 MED ORDER — TIOTROPIUM BROMIDE MONOHYDRATE 18 MCG IN CAPS: 18.0000 ug | ORAL_CAPSULE | Freq: Every day | RESPIRATORY_TRACT | Status: DC

## 2014-05-06 MED ORDER — ALBUTEROL SULFATE (2.5 MG/3ML) 0.083% IN NEBU: INHALATION_SOLUTION | RESPIRATORY_TRACT | Status: DC

## 2014-05-06 NOTE — Discharge Summary (Signed)
Physician Discharge Summary  [name] UEA:[id number] DOB: [birth year] DOA: 04/30/2014  PCP: [name], MD  Admit date: 04/30/2014 Discharge date: 05/06/2014  Recommendations for Outpatient Follow-up:  1. Please continue Levaquin for 3 more days on discharge for treatment of pneumonia. 2. Please have your primary care physician recheck A1c to check for diabetes. A1c on this admission was within normal limits. Primary care physician will decide if you need diabetic medications.  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 Reported on CTA chest. As per recommendations, annual CTA or MRA follow-up.  Discharge Diagnoses:  Principal Problem:   Acute respiratory failure with hypoxia Active Problems:   Hyperlipidemia   Essential hypertension   SIRS (systemic inflammatory response syndrome)   COPD exacerbation   Uncontrolled diabetes mellitus without complication   Blood poisoning    Discharge Condition: stable   Diet recommendation: as tolerated   History of present illness:  69 year old male with past medical history of COPD, hypertension, dyslipidemia, former smoker who presented to [facility] ED with worsening shortness of breath, wheezing, productive cough of yellow sputum and fevers for 2 days prior to this admission.   On admission, pt was febri with T max 102 . CXR showed no acute cardiopulmonary findings. CT angio of the chest showed no pulmonary embolism but it did show acute bronchitis changes with tiny patchy areas of parenchymal inflammation bilaterally. Pt was started on azithromycin and rocephin for treatment of pneumonia. He was also admitted for COPD exacerbation management.    Assessment/Plan:    Principal Problem: Acute respiratory failure with hypoxia / Lobar pneumonia due to unspecified organism / Acute COPD exacerbation  - Hypoxia on the admission likely related to combination of acute COPD exacerbation as well as possible pneumonia  considering fevers, productive cough. - There were no acute cardio pulmonary process is identified on chest x-ray done on the admission. CT angiogram of the chest showed acute bronchitic changes with tiny patchy areas of parenchymal inflammation bilaterally. - Empiric Rocephin and azithromycin started on the admission. Patient will continue Levaquin for 3 days on discharge. This was completed total of 10 day treatment for pneumonia. - Respiratory virus panel is negative. Blood cultures to date are negative. - Patient was started on Solu-Medrol on the admission. Solu-Medrol tapered down to once a day regimen. He does not need steroids on discharge because he does not have any wheezing or rhonchi or crackles on physical exam. - Patient reported he does not tolerate DuoNeb nebulizer. He prefers to be on his regular inhaler regimen at home. That includes Symbicort and Spiriva along with albuterol inhaler and albuterol nebulizer. - Patient may continue with Tessalon Perles as needed for coughing.  Active Problems: Sepsis due to pneumonia / LeukocytosisLactic acid was WNL and proalcitonin 0.5 - Sepsis physiology resolved. Sepsis thought to be secondary to pneumonia. Lactic acid was WNL and proalcitonin 0.5 - As noted above, patient was on azithromycin and Rocephin since the admission. Will change to Levaquin on discharge for 3 more days. - Blood cultures so far show no growth to date. Urine culture shows no growth. Respiratory virus panel is negative. - Leukocytosis resolved.  Essential hypertension - Continue Norvasc 5 mg daily and losartan per home regimen on discharge   Hyperlipidemia - Continue ezetimibe 10 mg daily.  Diabetes mellitus without complication / Steroid induced hyperglycemia - A1c on this admission 5.8.  - Hyperglycemia has been exacerbated with steroids. Please note that patient will not be discharged with  steroids. He is CBGs in last 24 hours, 109, 112, 163. - We'll defer to  primary care physician to recheck A1c in 3 months and decide if patient requires antihyperglycemic medications.  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 - Reported on CTA chest. - As per recommendations, annual CTA or MRA follow-up.  Hypokalemia - Likely due to nebs - Supplemented. Potassium WNL prior to discharge.   History of CAD - Continue aspirin daily.     DVT Prophylaxis  - Lovenox subQ ordered for patient in hospital   Code Status: Full.  Family Communication: plan of care discussed with the patient   IV access:  Peripheral IV  Procedures and diagnostic studies:   Dg Chest 2 View 04/30/2014 No active cardiopulmonary disease. Electronically Signed  By: Conchita Paris M.D. On: 04/30/2014 21:44   Ct Angio Chest Pe W/cm &/or Wo Cm 05/01/2014 There are no pulmonary emboli. The patient has emphysema with prominent new peribronchial thickening with tiny focal areas of inflammatory disease in both lungs. No consolidation. No effusions. Heart size is normal. There is slight prominence of the ascending thoracic aorta to a diameter of 4 cm. No osseous abnormality. : 1. No pulmonary emboli. 2. Emphysema with acute bronchitic changes with tiny patchy areas of parenchymal inflammation bilaterally. 3. 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 Recommend annual imaging followup by CTA or MRA. This recommendation follows 2010 ACCF/AHA/AATS/ACR/ASA/SCA/SCAI/SIR/STS/SVM Guidelines for the Diagnosis and Management of Patients with Thoracic Aortic Disease. Circulation. 2010  Medical Consultants:  None   Other Consultants:  Physical therapy   IAnti-Infectives:   Rocephin 05/01/14 -- > 05/06/2014  Azithromycin 05/01/14 --> 05/06/2014  Signed:  Leisa Lenz, MD  [facility] 05/06/2014, 8:02 AM  Pager #: [phone]  Time spent in minutes: more than 30 minutes   Discharge Exam: Filed Vitals:   05/06/14  0640  BP: 153/85  Pulse: 79  Temp: 98.1 F (36.7 C)  Resp: 20   Filed Vitals:   05/05/14 1626 05/05/14 2010 05/05/14 2100 05/06/14 0640  BP:   153/82 153/85  Pulse:   90 79  Temp:   97.9 F (36.6 C) 98.1 F (36.7 C)  TempSrc:   Oral Oral  Resp:   18 20  Height:      Weight:      SpO2: 96% 96% 96% 98%    General: Pt is alert, follows commands appropriately, not in acute distress Cardiovascular: Regular rate and rhythm, S1/S2 +, no murmurs Respiratory: Clear to auscultation bilaterally, no wheezing, no crackles, no rhonchi Abdominal: Soft, non tender, non distended, bowel sounds +, no guarding Extremities: no edema, no cyanosis, pulses palpable bilaterally DP and PT Neuro: Grossly nonfocal  Discharge Instructions  Discharge Instructions    Call MD for:  difficulty breathing, headache or visual disturbances    Complete by:  As directed      Call MD for:  persistant nausea and vomiting    Complete by:  As directed      Call MD for:  severe uncontrolled pain    Complete by:  As directed      Diet - low sodium heart healthy    Complete by:  As directed      Discharge instructions    Complete by:  As directed   1. Please continue Levaquin for 3 more days on discharge for treatment of pneumonia. 2. Please have your primary care physician recheck A1c to check for  diabetes. A1c on this admission was within normal limits. Primary care physician will decide if you need diabetic medications.     Increase activity slowly    Complete by:  As directed             Medication List    STOP taking these medications        blood glucose meter kit and supplies      TAKE these medications        albuterol 108 (90 BASE) MCG/ACT inhaler  Commonly known as:  PROVENTIL HFA  Inhale 2 puffs into the lungs 2 (two) times daily.     albuterol 108 (90 BASE) MCG/ACT inhaler  Commonly known as:  PROVENTIL HFA;VENTOLIN HFA  Inhale 2 puffs into the lungs every 6 (six) hours as needed for  wheezing or shortness of breath. For shortness of breath     albuterol (2.5 MG/3ML) 0.083% nebulizer solution  Commonly known as:  PROVENTIL  USE 1 VIAL IN NEBULIZER TWICE A DAY AS NEEDED     amLODipine 5 MG tablet  Commonly known as:  NORVASC  Take 1 tablet (5 mg total) by mouth daily.     aspirin 81 MG tablet  Take 81 mg by mouth daily.     benzonatate 200 MG capsule  Commonly known as:  TESSALON  Take 1 capsule (200 mg total) by mouth 2 (two) times daily as needed for cough.     budesonide-formoterol 160-4.5 MCG/ACT inhaler  Commonly known as:  SYMBICORT  Inhale 2 puffs into the lungs 2 (two) times daily.     ezetimibe 10 MG tablet  Commonly known as:  ZETIA  Take 1 tablet (10 mg total) by mouth daily.     levofloxacin 500 MG tablet  Commonly known as:  LEVAQUIN  Take 1 tablet (500 mg total) by mouth daily.     losartan 50 MG tablet  Commonly known as:  COZAAR  Take 1 tablet (50 mg total) by mouth daily.     nitroGLYCERIN 0.4 MG SL tablet  Commonly known as:  NITROSTAT  Place 1 tablet (0.4 mg total) under the tongue every 5 (five) minutes as needed for chest pain.     pantoprazole 40 MG tablet  Commonly known as:  PROTONIX  Take 40 mg by mouth daily.     potassium chloride SA 20 MEQ tablet  Commonly known as:  KLOR-CON M20  Take 1 tablet (20 mEq total) by mouth daily.     tiotropium 18 MCG inhalation capsule  Commonly known as:  SPIRIVA HANDIHALER  Place 1 capsule (18 mcg total) into inhaler and inhale daily.           Follow-up Information    Follow up with AVBUERE,EDWIN A, MD. Schedule an appointment as soon as possible for a visit in 1 week.   Specialty:  Internal Medicine   Why:  Follow up appt after recent hospitalization   Contact information:   [address] [place] [address] [phone]        The results of significant diagnostics from this hospitalization (including imaging, microbiology, ancillary and laboratory) are listed  below for reference.    Significant Diagnostic Studies: Dg Chest 2 View  04/30/2014   CLINICAL DATA:  Shortness of breath and productive cough for 2 days  EXAM: CHEST  2 VIEW  COMPARISON:  08/26/2013, chest CT 02/13/2013  FINDINGS: The heart size is at upper limits of normal. Aorta is ectatic. Both lungs are clear.  The visualized skeletal structures are unremarkable. Mild T11 compression deformity is stable.  IMPRESSION: No active cardiopulmonary disease.   Electronically Signed   By: Conchita Paris M.D.   On: 04/30/2014 21:44   Ct Angio Chest Pe W/cm &/or Wo Cm  05/01/2014   CLINICAL DATA:  Shortness of breath. Productive cough. Fever. Code sepsis. Elevated white blood count.  EXAM: CT ANGIOGRAPHY CHEST WITH CONTRAST  TECHNIQUE: Multidetector CT imaging of the chest was performed using the standard protocol during bolus administration of intravenous contrast. Multiplanar CT image reconstructions and MIPs were obtained to evaluate the vascular anatomy.  CONTRAST:  114m OMNIPAQUE IOHEXOL 350 MG/ML SOLN  COMPARISON:  Chest x-ray dated 04/30/2014 and chest CT dated 02/13/2013  FINDINGS: There are no pulmonary emboli. The patient has emphysema with prominent new peribronchial thickening with tiny focal areas of inflammatory disease in both lungs. No consolidation. No effusions. Heart size is normal. There is slight prominence of the ascending thoracic aorta to a diameter of 4 cm.  No osseous abnormality.  : 1. No pulmonary emboli. 2. Emphysema with acute bronchitic changes with tiny patchy areas of parenchymal inflammation bilaterally. 3. 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 Recommend annual imaging followup by CTA or MRA. This recommendation follows 2010 ACCF/AHA/AATS/ACR/ASA/SCA/SCAI/SIR/STS/SVM Guidelines for the Diagnosis and Management of Patients with Thoracic Aortic Disease. Circulation. 2010; 121:: [id number]  Electronically Signed   By: JLorriane ShireM.D.   On: 05/01/2014  00:45    Microbiology: Recent Results (from the past 240 hour(s))  Culture, blood (routine x 2)     Status: None (Preliminary result)   Collection Time: 04/30/14  8:50 PM  Result Value Ref Range Status   Specimen Description BLOOD LEFT ANTECUBITAL  Final   Special Requests BOTTLES DRAWN AEROBIC AND ANAEROBIC 5ML  Final   Culture   Final           BLOOD CULTURE RECEIVED NO GROWTH TO DATE CULTURE WILL BE HELD FOR 5 DAYS BEFORE ISSUING A FINAL NEGATIVE REPORT Performed at [facility]   Report Status PENDING  Incomplete  Urine culture     Status: None   Collection Time: 04/30/14  8:58 PM  Result Value Ref Range Status   Specimen Description URINE, CLEAN CATCH  Final   Special Requests NONE  Final   Colony Count NO GROWTH Performed at [facility]  Final   Culture NO GROWTH Performed at [facility]  Final   Report Status 05/02/2014 FINAL  Final  Culture, blood (routine x 2)     Status: None (Preliminary result)   Collection Time: 04/30/14  9:04 PM  Result Value Ref Range Status   Specimen Description BLOOD BLOOD RIGHT FOREARM  Final   Special Requests BOTTLES DRAWN AEROBIC AND ANAEROBIC 5ML  Final   Culture   Final           BLOOD CULTURE RECEIVED NO GROWTH TO DATE CULTURE WILL BE HELD FOR 5 DAYS BEFORE ISSUING A FINAL NEGATIVE REPORT Performed at [facility]   Report Status PENDING  Incomplete  Respiratory virus panel     Status: None   Collection Time: 05/01/14  3:08 AM  Result Value Ref Range Status   Source - RVPAN NOSE  Corrected   Respiratory Syncytial Virus A Negative Negative Final   Respiratory Syncytial Virus B Negative Negative Final   Influenza A Negative Negative Final   Influenza B Negative  Negative Final   Parainfluenza 1 Negative Negative Final   Parainfluenza 2 Negative Negative Final   Parainfluenza 3 Negative Negative Final   Metapneumovirus Negative Negative Final   Rhinovirus Negative Negative Final   Adenovirus  Negative Negative Final    Comment: (NOTE) Performed At: [facility] [address] [place], [place] [id number] Lindon Romp MD KT:[id number]      Labs: Basic Metabolic Panel:  Recent Labs Lab 04/30/14 2048 05/01/14 0516 05/05/14 1256  NA 136 139 136  K 3.3* 3.8 4.0  CL 107 106 100  CO2 _0 GLUCOSE 116* 168* 163*  BUN _1 CREATININE 0.90 0.99 0.86  CALCIUM 8.3* 8.2* 8.0*   Liver Function Tests:  Recent Labs Lab 04/30/14 2048  AST 27  ALT 31  ALKPHOS 72  BILITOT 0.5  PROT 6.6  ALBUMIN 3.7   No results for input(s): LIPASE, AMYLASE in the last 168 hours. No results for input(s): AMMONIA in the last 168 hours. CBC:  Recent Labs Lab 04/30/14 2048 05/01/14 0516  WBC 11.1* 8.5  NEUTROABS 7.5  --   HGB 14.6 14.2  HCT 43.8 43.8  MCV 89.8 90.1  PLT 295 285   Cardiac Enzymes: No results for input(s): CKTOTAL, CKMB, CKMBINDEX, TROPONINI in the last 168 hours. BNP: BNP (last 3 results) No results for input(s): BNP in the last 8760 hours.  ProBNP (last 3 results)  Recent Labs  08/01/13 1155 08/26/13 1653  PROBNP 60.5 84.4    CBG:  Recent Labs Lab 05/05/14 0748 05/05/14 1157 05/05/14 1651 05/05/14 2123 05/06/14 0726  GLUCAP 268* 106* 163* 112* 109*

## 2014-05-06 NOTE — Discharge Instructions (Signed)

## 2014-05-06 NOTE — Progress Notes (Signed)
Patient's AVS papers were printed before follow up appointment was made. His appointment time with his PCP was hand written by me for May 6th, 2016 at 9:15 am. Patient is aware of appointment.

## 2014-05-07 LAB — CULTURE, BLOOD (ROUTINE X 2)
Culture: NO GROWTH
Culture: NO GROWTH

## 2014-05-14 DIAGNOSIS — I10 Essential (primary) hypertension: Secondary | ICD-10-CM | POA: Diagnosis not present

## 2014-05-14 DIAGNOSIS — J449 Chronic obstructive pulmonary disease, unspecified: Secondary | ICD-10-CM | POA: Diagnosis not present

## 2014-05-14 DIAGNOSIS — R7301 Impaired fasting glucose: Secondary | ICD-10-CM | POA: Diagnosis not present

## 2014-05-14 DIAGNOSIS — E784 Other hyperlipidemia: Secondary | ICD-10-CM | POA: Diagnosis not present

## 2014-06-14 DIAGNOSIS — I10 Essential (primary) hypertension: Secondary | ICD-10-CM | POA: Diagnosis not present

## 2014-06-14 DIAGNOSIS — K219 Gastro-esophageal reflux disease without esophagitis: Secondary | ICD-10-CM | POA: Diagnosis not present

## 2014-06-14 DIAGNOSIS — M179 Osteoarthritis of knee, unspecified: Secondary | ICD-10-CM | POA: Diagnosis not present

## 2014-06-14 DIAGNOSIS — R7301 Impaired fasting glucose: Secondary | ICD-10-CM | POA: Diagnosis not present

## 2014-06-14 DIAGNOSIS — E784 Other hyperlipidemia: Secondary | ICD-10-CM | POA: Diagnosis not present

## 2014-06-14 DIAGNOSIS — J449 Chronic obstructive pulmonary disease, unspecified: Secondary | ICD-10-CM | POA: Diagnosis not present

## 2014-07-23 ENCOUNTER — Other Ambulatory Visit: Payer: Self-pay | Admitting: *Deleted

## 2014-07-23 MED ORDER — POTASSIUM CHLORIDE CRYS ER 20 MEQ PO TBCR: 20.0000 meq | EXTENDED_RELEASE_TABLET | Freq: Every day | ORAL | Status: DC

## 2014-07-27 ENCOUNTER — Telehealth: Payer: Self-pay | Admitting: *Deleted

## 2014-07-27 NOTE — Telephone Encounter (Signed)
[facility] requests a refill on amlodipine 5mg  for patient. Ok to refill? Please advise. Thanks, MI

## 2014-07-29 ENCOUNTER — Other Ambulatory Visit: Payer: Self-pay

## 2014-07-29 MED ORDER — AMLODIPINE BESYLATE 5 MG PO TABS: 5.0000 mg | ORAL_TABLET | Freq: Every day | ORAL | Status: DC

## 2014-07-29 NOTE — Telephone Encounter (Signed)
Patient called spoke to daughter.Pt past due to see [place] office visit 03/26/13.Follow up appointment scheduled with Dr.Jordan 10/11/14 at 2:00 pm.Amlodipine refill sent to pharmacy.

## 2014-09-06 DIAGNOSIS — R7301 Impaired fasting glucose: Secondary | ICD-10-CM | POA: Diagnosis not present

## 2014-09-06 DIAGNOSIS — I2510 Atherosclerotic heart disease of native coronary artery without angina pectoris: Secondary | ICD-10-CM | POA: Diagnosis not present

## 2014-09-06 DIAGNOSIS — I10 Essential (primary) hypertension: Secondary | ICD-10-CM | POA: Diagnosis not present

## 2014-09-06 DIAGNOSIS — E784 Other hyperlipidemia: Secondary | ICD-10-CM | POA: Diagnosis not present

## 2014-09-06 DIAGNOSIS — J449 Chronic obstructive pulmonary disease, unspecified: Secondary | ICD-10-CM | POA: Diagnosis not present

## 2014-09-10 ENCOUNTER — Other Ambulatory Visit: Payer: Self-pay | Admitting: Cardiology

## 2014-09-21 ENCOUNTER — Other Ambulatory Visit: Payer: Self-pay | Admitting: *Deleted

## 2014-09-21 MED ORDER — POTASSIUM CHLORIDE CRYS ER 20 MEQ PO TBCR: 20.0000 meq | EXTENDED_RELEASE_TABLET | Freq: Every day | ORAL | Status: DC

## 2014-10-01 DIAGNOSIS — H02834 Dermatochalasis of left upper eyelid: Secondary | ICD-10-CM | POA: Diagnosis not present

## 2014-10-01 DIAGNOSIS — H40013 Open angle with borderline findings, low risk, bilateral: Secondary | ICD-10-CM | POA: Diagnosis not present

## 2014-10-01 DIAGNOSIS — H02831 Dermatochalasis of right upper eyelid: Secondary | ICD-10-CM | POA: Diagnosis not present

## 2014-10-01 DIAGNOSIS — H25813 Combined forms of age-related cataract, bilateral: Secondary | ICD-10-CM | POA: Diagnosis not present

## 2014-10-01 DIAGNOSIS — H11153 Pinguecula, bilateral: Secondary | ICD-10-CM | POA: Diagnosis not present

## 2014-10-11 ENCOUNTER — Ambulatory Visit: Payer: Commercial Managed Care - HMO | Admitting: Cardiology

## 2014-10-15 DIAGNOSIS — H02834 Dermatochalasis of left upper eyelid: Secondary | ICD-10-CM | POA: Diagnosis not present

## 2014-10-15 DIAGNOSIS — H40013 Open angle with borderline findings, low risk, bilateral: Secondary | ICD-10-CM | POA: Diagnosis not present

## 2014-10-15 DIAGNOSIS — H02831 Dermatochalasis of right upper eyelid: Secondary | ICD-10-CM | POA: Diagnosis not present

## 2014-10-15 DIAGNOSIS — H25813 Combined forms of age-related cataract, bilateral: Secondary | ICD-10-CM | POA: Diagnosis not present

## 2014-10-15 DIAGNOSIS — H11153 Pinguecula, bilateral: Secondary | ICD-10-CM | POA: Diagnosis not present

## 2014-10-21 DIAGNOSIS — H2512 Age-related nuclear cataract, left eye: Secondary | ICD-10-CM | POA: Diagnosis not present

## 2014-10-21 DIAGNOSIS — H268 Other specified cataract: Secondary | ICD-10-CM | POA: Diagnosis not present

## 2014-11-05 DIAGNOSIS — R0689 Other abnormalities of breathing: Secondary | ICD-10-CM | POA: Diagnosis not present

## 2014-11-05 DIAGNOSIS — R7301 Impaired fasting glucose: Secondary | ICD-10-CM | POA: Diagnosis not present

## 2014-11-05 DIAGNOSIS — J209 Acute bronchitis, unspecified: Secondary | ICD-10-CM | POA: Diagnosis not present

## 2014-11-05 DIAGNOSIS — J449 Chronic obstructive pulmonary disease, unspecified: Secondary | ICD-10-CM | POA: Diagnosis not present

## 2014-11-05 DIAGNOSIS — I10 Essential (primary) hypertension: Secondary | ICD-10-CM | POA: Diagnosis not present

## 2014-11-12 DIAGNOSIS — J209 Acute bronchitis, unspecified: Secondary | ICD-10-CM | POA: Diagnosis not present

## 2014-11-12 DIAGNOSIS — I10 Essential (primary) hypertension: Secondary | ICD-10-CM | POA: Diagnosis not present

## 2014-11-12 DIAGNOSIS — Z1389 Encounter for screening for other disorder: Secondary | ICD-10-CM | POA: Diagnosis not present

## 2014-11-12 DIAGNOSIS — E784 Other hyperlipidemia: Secondary | ICD-10-CM | POA: Diagnosis not present

## 2014-11-12 DIAGNOSIS — Z125 Encounter for screening for malignant neoplasm of prostate: Secondary | ICD-10-CM | POA: Diagnosis not present

## 2014-11-21 DIAGNOSIS — H2511 Age-related nuclear cataract, right eye: Secondary | ICD-10-CM | POA: Diagnosis not present

## 2014-11-22 ENCOUNTER — Ambulatory Visit (INDEPENDENT_AMBULATORY_CARE_PROVIDER_SITE_OTHER)
Admission: RE | Admit: 2014-11-22 | Discharge: 2014-11-22 | Disposition: A | Discharge status: Home / Self Care | Payer: Commercial Managed Care - HMO | Source: Ambulatory Visit | Attending: Pulmonary Disease | Admitting: Pulmonary Disease

## 2014-11-22 ENCOUNTER — Encounter: Payer: Self-pay | Admitting: Pulmonary Disease

## 2014-11-22 ENCOUNTER — Ambulatory Visit (INDEPENDENT_AMBULATORY_CARE_PROVIDER_SITE_OTHER): Payer: Commercial Managed Care - HMO | Admitting: Pulmonary Disease

## 2014-11-22 VITALS — BP 142/80 | HR 86 | Ht 61.0 in | Wt 138.0 lb

## 2014-11-22 DIAGNOSIS — R059 Cough, unspecified: Secondary | ICD-10-CM

## 2014-11-22 DIAGNOSIS — Z23 Encounter for immunization: Secondary | ICD-10-CM

## 2014-11-22 DIAGNOSIS — J45909 Unspecified asthma, uncomplicated: Secondary | ICD-10-CM | POA: Diagnosis not present

## 2014-11-22 DIAGNOSIS — R05 Cough: Secondary | ICD-10-CM

## 2014-11-22 DIAGNOSIS — J449 Chronic obstructive pulmonary disease, unspecified: Secondary | ICD-10-CM | POA: Diagnosis not present

## 2014-11-22 DIAGNOSIS — J441 Chronic obstructive pulmonary disease with (acute) exacerbation: Secondary | ICD-10-CM | POA: Diagnosis not present

## 2014-11-22 MED ORDER — AEROCHAMBER MV MISC: Status: DC

## 2014-11-22 MED ORDER — PREDNISONE 10 MG PO TABS: ORAL_TABLET | ORAL | Status: DC

## 2014-11-22 NOTE — Assessment & Plan Note (Signed)
He is currently having another exacerbation of his severe COPD. His compliance with medications is not very good. I do not think he understands his medications very well. I question whether or not he has an atypical mycobacterial infection considering the findings from his CT chest in April 2016.  Plan: He was instructed today on appropriate HFA inhaler technique Add a spacer for HFA inhalers Prednisone taper Chest x-ray Sputum culture for AFB, fungal, bacterial culture Continue Spiriva He was instructed to use Symbicort 2 puffs twice a day no matter how he feels, not as needed Use albuterol as needed Flu shot administered today, Prevnar vaccine administered today Follow-up 3 months or sooner if needed

## 2014-11-22 NOTE — Patient Instructions (Signed)
Take over-the-counter Mucinex twice a day as needed for chest congestion and thick mucus Use the Symbicort 2 puffs twice a day no matter how you feel. Use the Symbicort through the spacer we gave you. Takes Spiriva daily Use albuterol 2 puffs every 4-6 hours as needed for shortness of breath Bring [place] a sample of your mucus so that we can test it Take the prednisone taper as prescribed We will call you with the results of the chest x-ray We will see you back in 3 months or sooner if needed

## 2014-11-22 NOTE — Addendum Note (Signed)
Addended by: Len Blalock on: 11/22/2014 09:56 AM   Modules accepted: Orders

## 2014-11-22 NOTE — Progress Notes (Signed)
Subjective:    Patient ID: [name], male    DOB: 1945/08/27, 69 y.o.   MRN: [id number]  Synopsis: Former patient of Dr. Joya Gaskins with COPD COPD GOLD C  2008 [place]: FeV1 63% 12/11/2011  [place]:  FeV1 47%  FeV1/FVC 41%     HPI Chief Complaint  Patient presents with  . Follow-up    former PW pt being seen for COPD.  pt c/o SOB, prod cough with brown thick mucus.     He says that he has been short of breath and having chest tightness.  This has been worse recently. He takes the Symbicort anywhere from 2-3 times a day, the albuterol 2-3 times a day. He takes Spiriva 1 time a day. He used to smoke and quit smoking 4 years. He says that he has has taken prednisone given to him by the family doctor. He says that he produces brown sputum from time to time. He thinks this is coming from his chest. No fever, no chills.  No hemoptysis, not losing weight.  He was seen today with an interpreter.  Past Medical History  Diagnosis Date  . COPD (chronic obstructive pulmonary disease) ([place])     former smoker  . GERD (gastroesophageal reflux disease)   . CAD (coronary artery disease), native coronary artery 6/11, 2/14, 6/14; 7/15    a. thought due to vasospasm . cath - CAD LAD 40-50%, LCX 40%, RCA 25% EF 65%. lexiscan myoview EF 68% w/o evidence of ischemia or infarct b. STEMI s/p BMS-mid RCA c. BMS x 2- prox RCA, PLOM  . HTN (hypertension)   . Asthma   . Hyperlipidemia   . Tobacco abuse   . Allergic rhinitis       Review of Systems  Constitutional: Negative for fever, chills and fatigue.  HENT: Negative for postnasal drip, rhinorrhea and sinus pressure.   Respiratory: Positive for cough, shortness of breath and wheezing.   Cardiovascular: Negative for chest pain, palpitations and leg swelling.       Objective:   Physical Exam Filed Vitals:   11/22/14 0927  BP: 142/80  Pulse: 86  Height: 5\' 1"  (1.549 m)  Weight: 138 lb (62.596 kg)  SpO2: 97%   RA  Gen: well appearing HENT:  OP clear, TM's clear, neck supple PULM: Wheezing bilaterally, good air movement CV: RRR, no mgr, trace edema GI: BS+, soft, nontender Derm: no cyanosis or rash Psyche: normal mood and affect    April 2016 CT angiogram chest images personally reviewed showing very mild centrilobular emphysema, tree-in-bud abnormalities in the right upper lobe, some in the lingula and also some in the right lower lobe, some bronchial wall thickening but no clear bronchiectasis.     Records from his April 2016 hospitalization for a COPD exacerbation were reviewed, he was treated with steroids and antibiotics. Assessment & Plan:  COPD Gold C He is currently having another exacerbation of his severe COPD. His compliance with medications is not very good. I do not think he understands his medications very well. I question whether or not he has an atypical mycobacterial infection considering the findings from his CT chest in April 2016.  Plan: He was instructed today on appropriate HFA inhaler technique Add a spacer for HFA inhalers Prednisone taper Chest x-ray Sputum culture for AFB, fungal, bacterial culture Continue Spiriva He was instructed to use Symbicort 2 puffs twice a day no matter how he feels, not as needed Use albuterol as needed Flu shot administered today,  Prevnar vaccine administered today Follow-up 3 months or sooner if needed     Current outpatient prescriptions:  .  albuterol (PROVENTIL HFA) 108 (90 BASE) MCG/ACT inhaler, Inhale 2 puffs into the lungs 2 (two) times daily., Disp: 1 Inhaler, Rfl: 1 .  albuterol (PROVENTIL) (2.5 MG/3ML) 0.083% nebulizer solution, USE 1 VIAL IN NEBULIZER TWICE A DAY AS NEEDED, Disp: 375 mL, Rfl: 1 .  amLODipine (NORVASC) 5 MG tablet, Take 1 tablet (5 mg total) by mouth daily., Disp: 30 tablet, Rfl: 2 .  aspirin 81 MG tablet, Take 81 mg by mouth daily. , Disp: , Rfl:  .  budesonide-formoterol (SYMBICORT) 160-4.5 MCG/ACT inhaler, Inhale 2 puffs into the  lungs 2 (two) times daily., Disp: 1 Inhaler, Rfl: 1 .  pantoprazole (PROTONIX) 40 MG tablet, Take 40 mg by mouth daily., Disp: , Rfl:  .  potassium chloride SA (KLOR-CON M20) 20 MEQ tablet, Take 1 tablet (20 mEq total) by mouth daily. NEEDS TO MAKE APPOINTMENT WITH PRIMARY CARDIOLOGIST FOR FURTHER REFILLS [phone], Disp: 30 tablet, Rfl: 0 .  tiotropium (SPIRIVA HANDIHALER) 18 MCG inhalation capsule, Place 1 capsule (18 mcg total) into inhaler and inhale daily., Disp: 30 capsule, Rfl: 1 .  ezetimibe (ZETIA) 10 MG tablet, Take 1 tablet (10 mg total) by mouth daily., Disp: 30 tablet, Rfl: 6 .  losartan (COZAAR) 50 MG tablet, Take 1 tablet (50 mg total) by mouth daily., Disp: 60 tablet, Rfl: 6 .  nitroGLYCERIN (NITROSTAT) 0.4 MG SL tablet, Place 1 tablet (0.4 mg total) under the tongue every 5 (five) minutes as needed for chest pain. (Patient not taking: Reported on 11/22/2014), Disp: 25 tablet, Rfl: 11 .  omeprazole (PRILOSEC) 20 MG capsule, TAKE 1 CAPSULE (20 MG TOTAL) BY MOUTH 2 (TWO) TIMES DAILY BEFORE A MEAL., Disp: 60 capsule, Rfl: 4 .  predniSONE (DELTASONE) 10 MG tablet, Take 40mg  po daily for 3 days, then take 30mg  po daily for 3 days, then take 20mg  po daily for two days, then take 10mg  po daily for 2 days, Disp: 27 tablet, Rfl: 0 .  Spacer/Aero-Holding Chambers (AEROCHAMBER MV) inhaler, Use as instructed, Disp: 1 each, Rfl: 0 .  [DISCONTINUED] arformoterol (BROVANA) 15 MCG/2ML NEBU, Take 2 mLs (15 mcg total) by nebulization 2 (two) times daily. Dx code: [id number], Disp: 120 mL, Rfl: 11 .  [DISCONTINUED] budesonide (PULMICORT) 0.5 MG/2ML nebulizer solution, Take 2 mLs (0.5 mg total) by nebulization 2 (two) times daily. DX:  496, Disp: 120 mL, Rfl: 11 .  [DISCONTINUED] esomeprazole (NEXIUM) 40 MG capsule, Take 1 capsule (40 mg total) by mouth daily., Disp: 30 capsule, Rfl: 6 '

## 2014-11-24 ENCOUNTER — Other Ambulatory Visit: Payer: Commercial Managed Care - HMO

## 2014-11-24 DIAGNOSIS — R059 Cough, unspecified: Secondary | ICD-10-CM

## 2014-11-24 DIAGNOSIS — R05 Cough: Secondary | ICD-10-CM | POA: Diagnosis not present

## 2014-11-24 NOTE — Progress Notes (Signed)
Quick Note:  ATC number listed as home- NA and VM not set up  Called mobile number and it has been d/c'ed ______

## 2014-11-25 DIAGNOSIS — H2511 Age-related nuclear cataract, right eye: Secondary | ICD-10-CM | POA: Diagnosis not present

## 2014-11-25 NOTE — Progress Notes (Signed)
Quick Note:  Contacted pt with results per BQ Pt expressed understanding, nothing further needed ______ 

## 2014-12-06 DIAGNOSIS — I2510 Atherosclerotic heart disease of native coronary artery without angina pectoris: Secondary | ICD-10-CM | POA: Diagnosis not present

## 2014-12-06 DIAGNOSIS — J449 Chronic obstructive pulmonary disease, unspecified: Secondary | ICD-10-CM | POA: Diagnosis not present

## 2014-12-06 DIAGNOSIS — D2212 Melanocytic nevi of left eyelid, including canthus: Secondary | ICD-10-CM | POA: Diagnosis not present

## 2014-12-06 DIAGNOSIS — I10 Essential (primary) hypertension: Secondary | ICD-10-CM | POA: Diagnosis not present

## 2014-12-06 DIAGNOSIS — K219 Gastro-esophageal reflux disease without esophagitis: Secondary | ICD-10-CM | POA: Diagnosis not present

## 2014-12-06 DIAGNOSIS — R7301 Impaired fasting glucose: Secondary | ICD-10-CM | POA: Diagnosis not present

## 2014-12-06 DIAGNOSIS — L820 Inflamed seborrheic keratosis: Secondary | ICD-10-CM | POA: Diagnosis not present

## 2014-12-09 LAB — LOWER RESPIRATORY CULTURE

## 2014-12-23 ENCOUNTER — Encounter (HOSPITAL_COMMUNITY): Payer: Self-pay | Admitting: *Deleted

## 2014-12-23 ENCOUNTER — Emergency Department (HOSPITAL_COMMUNITY): Payer: Commercial Managed Care - HMO

## 2014-12-23 ENCOUNTER — Ambulatory Visit: Payer: Commercial Managed Care - HMO | Admitting: Pulmonary Disease

## 2014-12-23 ENCOUNTER — Inpatient Hospital Stay (HOSPITAL_COMMUNITY)
Admission: EM | Admit: 2014-12-23 | Discharge: 2014-12-27 | DRG: 191 | Disposition: A | Discharge status: Home / Self Care | Payer: Commercial Managed Care - HMO | Attending: Internal Medicine | Admitting: Internal Medicine

## 2014-12-23 ENCOUNTER — Telehealth: Payer: Self-pay | Admitting: Pulmonary Disease

## 2014-12-23 ENCOUNTER — Inpatient Hospital Stay (HOSPITAL_COMMUNITY): Payer: Commercial Managed Care - HMO

## 2014-12-23 DIAGNOSIS — Z720 Tobacco use: Secondary | ICD-10-CM | POA: Diagnosis not present

## 2014-12-23 DIAGNOSIS — I2510 Atherosclerotic heart disease of native coronary artery without angina pectoris: Secondary | ICD-10-CM

## 2014-12-23 DIAGNOSIS — J209 Acute bronchitis, unspecified: Secondary | ICD-10-CM | POA: Diagnosis present

## 2014-12-23 DIAGNOSIS — E785 Hyperlipidemia, unspecified: Secondary | ICD-10-CM | POA: Diagnosis not present

## 2014-12-23 DIAGNOSIS — Z791 Long term (current) use of non-steroidal anti-inflammatories (NSAID): Secondary | ICD-10-CM

## 2014-12-23 DIAGNOSIS — I509 Heart failure, unspecified: Secondary | ICD-10-CM | POA: Diagnosis not present

## 2014-12-23 DIAGNOSIS — E876 Hypokalemia: Secondary | ICD-10-CM | POA: Diagnosis present

## 2014-12-23 DIAGNOSIS — E1165 Type 2 diabetes mellitus with hyperglycemia: Secondary | ICD-10-CM | POA: Diagnosis not present

## 2014-12-23 DIAGNOSIS — I25119 Atherosclerotic heart disease of native coronary artery with unspecified angina pectoris: Secondary | ICD-10-CM | POA: Diagnosis present

## 2014-12-23 DIAGNOSIS — K219 Gastro-esophageal reflux disease without esophagitis: Secondary | ICD-10-CM | POA: Diagnosis not present

## 2014-12-23 DIAGNOSIS — J441 Chronic obstructive pulmonary disease with (acute) exacerbation: Secondary | ICD-10-CM

## 2014-12-23 DIAGNOSIS — Z9861 Coronary angioplasty status: Secondary | ICD-10-CM

## 2014-12-23 DIAGNOSIS — E099 Drug or chemical induced diabetes mellitus without complications: Secondary | ICD-10-CM | POA: Diagnosis not present

## 2014-12-23 DIAGNOSIS — T380X5A Adverse effect of glucocorticoids and synthetic analogues, initial encounter: Secondary | ICD-10-CM | POA: Diagnosis present

## 2014-12-23 DIAGNOSIS — R0789 Other chest pain: Secondary | ICD-10-CM

## 2014-12-23 DIAGNOSIS — J440 Chronic obstructive pulmonary disease with acute lower respiratory infection: Secondary | ICD-10-CM | POA: Diagnosis not present

## 2014-12-23 DIAGNOSIS — Z7984 Long term (current) use of oral hypoglycemic drugs: Secondary | ICD-10-CM | POA: Diagnosis not present

## 2014-12-23 DIAGNOSIS — I252 Old myocardial infarction: Secondary | ICD-10-CM | POA: Diagnosis not present

## 2014-12-23 DIAGNOSIS — I25118 Atherosclerotic heart disease of native coronary artery with other forms of angina pectoris: Secondary | ICD-10-CM | POA: Diagnosis not present

## 2014-12-23 DIAGNOSIS — R079 Chest pain, unspecified: Secondary | ICD-10-CM | POA: Diagnosis not present

## 2014-12-23 DIAGNOSIS — Z7952 Long term (current) use of systemic steroids: Secondary | ICD-10-CM

## 2014-12-23 DIAGNOSIS — J45909 Unspecified asthma, uncomplicated: Secondary | ICD-10-CM | POA: Diagnosis present

## 2014-12-23 DIAGNOSIS — Z87891 Personal history of nicotine dependence: Secondary | ICD-10-CM | POA: Diagnosis not present

## 2014-12-23 DIAGNOSIS — I10 Essential (primary) hypertension: Secondary | ICD-10-CM

## 2014-12-23 DIAGNOSIS — Z7982 Long term (current) use of aspirin: Secondary | ICD-10-CM

## 2014-12-23 DIAGNOSIS — Z955 Presence of coronary angioplasty implant and graft: Secondary | ICD-10-CM

## 2014-12-23 DIAGNOSIS — Z79899 Other long term (current) drug therapy: Secondary | ICD-10-CM

## 2014-12-23 DIAGNOSIS — R0602 Shortness of breath: Secondary | ICD-10-CM | POA: Diagnosis not present

## 2014-12-23 DIAGNOSIS — Z7951 Long term (current) use of inhaled steroids: Secondary | ICD-10-CM | POA: Diagnosis not present

## 2014-12-23 DIAGNOSIS — I209 Angina pectoris, unspecified: Secondary | ICD-10-CM | POA: Diagnosis not present

## 2014-12-23 DIAGNOSIS — I493 Ventricular premature depolarization: Secondary | ICD-10-CM | POA: Diagnosis present

## 2014-12-23 DIAGNOSIS — I471 Supraventricular tachycardia: Secondary | ICD-10-CM | POA: Diagnosis not present

## 2014-12-23 DIAGNOSIS — E1159 Type 2 diabetes mellitus with other circulatory complications: Secondary | ICD-10-CM | POA: Diagnosis not present

## 2014-12-23 DIAGNOSIS — K210 Gastro-esophageal reflux disease with esophagitis: Secondary | ICD-10-CM | POA: Diagnosis not present

## 2014-12-23 DIAGNOSIS — E0965 Drug or chemical induced diabetes mellitus with hyperglycemia: Secondary | ICD-10-CM

## 2014-12-23 DIAGNOSIS — I25111 Atherosclerotic heart disease of native coronary artery with angina pectoris with documented spasm: Secondary | ICD-10-CM | POA: Diagnosis not present

## 2014-12-23 LAB — CBC WITH DIFFERENTIAL/PLATELET
BASOS PCT: 0 %
Basophils Absolute: 0 10*3/uL (ref 0.0–0.1)
EOS ABS: 0.3 10*3/uL (ref 0.0–0.7)
Eosinophils Relative: 2 %
HCT: 46.3 % (ref 39.0–52.0)
HEMOGLOBIN: 15.4 g/dL (ref 13.0–17.0)
LYMPHS ABS: 2.3 10*3/uL (ref 0.7–4.0)
Lymphocytes Relative: 16 %
MCH: 30.4 pg (ref 26.0–34.0)
MCHC: 33.3 g/dL (ref 30.0–36.0)
MCV: 91.5 fL (ref 78.0–100.0)
Monocytes Absolute: 0.8 10*3/uL (ref 0.1–1.0)
Monocytes Relative: 6 %
NEUTROS PCT: 76 %
Neutro Abs: 10.8 10*3/uL — ABNORMAL HIGH (ref 1.7–7.7)
Platelets: 307 10*3/uL (ref 150–400)
RBC: 5.06 MIL/uL (ref 4.22–5.81)
RDW: 14.5 % (ref 11.5–15.5)
WBC: 14.2 10*3/uL — AB (ref 4.0–10.5)

## 2014-12-23 LAB — COMPREHENSIVE METABOLIC PANEL
ALBUMIN: 3.7 g/dL (ref 3.5–5.0)
ALK PHOS: 67 U/L (ref 38–126)
ALT: 45 U/L (ref 17–63)
AST: 19 U/L (ref 15–41)
Anion gap: 9 (ref 5–15)
BUN: 15 mg/dL (ref 6–20)
CALCIUM: 8.6 mg/dL — AB (ref 8.9–10.3)
CO2: 29 mmol/L (ref 22–32)
CREATININE: 0.8 mg/dL (ref 0.61–1.24)
Chloride: 103 mmol/L (ref 101–111)
GFR calc Af Amer: >60 mL/min (ref >60)
GFR calc non Af Amer: >60 mL/min (ref >60)
GLUCOSE: 90 mg/dL (ref 65–99)
Potassium: 3.3 mmol/L — ABNORMAL LOW (ref 3.5–5.1)
SODIUM: 141 mmol/L (ref 135–145)
Total Bilirubin: 0.8 mg/dL (ref 0.3–1.2)
Total Protein: 6.5 g/dL (ref 6.5–8.1)

## 2014-12-23 LAB — I-STAT TROPONIN, ED: Troponin i, poc: 0.02 ng/mL (ref 0.00–0.08)

## 2014-12-23 LAB — PROTIME-INR
INR: 0.99 (ref 0.00–1.49)
PROTHROMBIN TIME: 13.3 s (ref 11.6–15.2)

## 2014-12-23 LAB — TROPONIN I: Troponin I: <0.03 ng/mL (ref <0.031)

## 2014-12-23 LAB — BRAIN NATRIURETIC PEPTIDE: B Natriuretic Peptide: 45.4 pg/mL (ref 0.0–100.0)

## 2014-12-23 LAB — LIPASE, BLOOD: Lipase: 22 U/L (ref 11–51)

## 2014-12-23 MED ORDER — HYDROCODONE-ACETAMINOPHEN 5-325 MG PO TABS: 1.0000 | ORAL_TABLET | ORAL | Status: DC | PRN

## 2014-12-23 MED ORDER — NITROGLYCERIN 2 % TD OINT
1.0000 [in_us] | TOPICAL_OINTMENT | Freq: Once | TRANSDERMAL | Status: AC
  Administered 2014-12-23: 1 [in_us] via TOPICAL
  Filled 2014-12-23: qty 30

## 2014-12-23 MED ORDER — ASPIRIN 81 MG PO CHEW
CHEWABLE_TABLET | ORAL | Status: AC
  Filled 2014-12-23: qty 4

## 2014-12-23 MED ORDER — ONDANSETRON HCL 4 MG PO TABS
4.0000 mg | ORAL_TABLET | Freq: Four times a day (QID) | ORAL | Status: DC | PRN
Start: 2014-12-23 — End: 2014-12-25

## 2014-12-23 MED ORDER — ALBUTEROL SULFATE (2.5 MG/3ML) 0.083% IN NEBU: 2.5000 mg | INHALATION_SOLUTION | Freq: Two times a day (BID) | RESPIRATORY_TRACT | Status: DC

## 2014-12-23 MED ORDER — GUAIFENESIN ER 600 MG PO TB12
600.0000 mg | ORAL_TABLET | Freq: Two times a day (BID) | ORAL | Status: DC | PRN
  Administered 2014-12-25: 600 mg via ORAL
  Filled 2014-12-23: qty 1

## 2014-12-23 MED ORDER — EZETIMIBE 10 MG PO TABS
10.0000 mg | ORAL_TABLET | Freq: Every day | ORAL | Status: DC
  Administered 2014-12-24 – 2014-12-27 (×4): 10 mg via ORAL
  Filled 2014-12-23 (×4): qty 1

## 2014-12-23 MED ORDER — NITROGLYCERIN 0.4 MG SL SUBL: 0.4000 mg | SUBLINGUAL_TABLET | SUBLINGUAL | Status: DC | PRN

## 2014-12-23 MED ORDER — ALBUTEROL SULFATE HFA 108 (90 BASE) MCG/ACT IN AERS: 2.0000 | INHALATION_SPRAY | Freq: Two times a day (BID) | RESPIRATORY_TRACT | Status: DC

## 2014-12-23 MED ORDER — POTASSIUM CHLORIDE CRYS ER 20 MEQ PO TBCR
40.0000 meq | EXTENDED_RELEASE_TABLET | Freq: Once | ORAL | Status: AC
  Administered 2014-12-23: 40 meq via ORAL
  Filled 2014-12-23: qty 2

## 2014-12-23 MED ORDER — SODIUM CHLORIDE 0.9 % IJ SOLN
3.0000 mL | Freq: Two times a day (BID) | INTRAMUSCULAR | Status: DC
  Administered 2014-12-23 – 2014-12-26 (×5): 3 mL via INTRAVENOUS

## 2014-12-23 MED ORDER — ASPIRIN 81 MG PO CHEW
324.0000 mg | CHEWABLE_TABLET | Freq: Once | ORAL | Status: AC
  Administered 2014-12-23: 324 mg via ORAL

## 2014-12-23 MED ORDER — NITROGLYCERIN 2 % TD OINT
0.5000 [in_us] | TOPICAL_OINTMENT | Freq: Three times a day (TID) | TRANSDERMAL | Status: DC
  Administered 2014-12-23 – 2014-12-27 (×12): 0.5 [in_us] via TOPICAL
  Filled 2014-12-23: qty 30

## 2014-12-23 MED ORDER — PANTOPRAZOLE SODIUM 40 MG PO TBEC
40.0000 mg | DELAYED_RELEASE_TABLET | Freq: Every day | ORAL | Status: DC
  Administered 2014-12-23 – 2014-12-26 (×4): 40 mg via ORAL
  Filled 2014-12-23 (×6): qty 1

## 2014-12-23 MED ORDER — LEVOFLOXACIN 500 MG PO TABS
500.0000 mg | ORAL_TABLET | Freq: Every day | ORAL | Status: DC
  Administered 2014-12-23 – 2014-12-27 (×5): 500 mg via ORAL
  Filled 2014-12-23 (×5): qty 1

## 2014-12-23 MED ORDER — POLYETHYLENE GLYCOL 3350 17 G PO PACK: 17.0000 g | PACK | Freq: Every day | ORAL | Status: DC | PRN

## 2014-12-23 MED ORDER — ONDANSETRON HCL 4 MG/2ML IJ SOLN
4.0000 mg | Freq: Four times a day (QID) | INTRAMUSCULAR | Status: DC | PRN
Start: 2014-12-23 — End: 2014-12-27

## 2014-12-23 MED ORDER — METHYLPREDNISOLONE SODIUM SUCC 125 MG IJ SOLR
60.0000 mg | Freq: Three times a day (TID) | INTRAMUSCULAR | Status: DC
  Administered 2014-12-23 (×2): 60 mg via INTRAVENOUS
  Filled 2014-12-23: qty 0.96
  Filled 2014-12-23: qty 2
  Filled 2014-12-23 (×2): qty 0.96

## 2014-12-23 MED ORDER — GUAIFENESIN-DM 100-10 MG/5ML PO SYRP
5.0000 mL | ORAL_SOLUTION | ORAL | Status: DC | PRN
  Administered 2014-12-24 – 2014-12-26 (×4): 5 mL via ORAL
  Filled 2014-12-23 (×4): qty 10

## 2014-12-23 MED ORDER — ENOXAPARIN SODIUM 40 MG/0.4ML ~~LOC~~ SOLN
40.0000 mg | SUBCUTANEOUS | Status: DC
  Administered 2014-12-23 – 2014-12-26 (×4): 40 mg via SUBCUTANEOUS
  Filled 2014-12-23 (×5): qty 0.4

## 2014-12-23 MED ORDER — LOSARTAN POTASSIUM 50 MG PO TABS
50.0000 mg | ORAL_TABLET | Freq: Every day | ORAL | Status: DC
  Administered 2014-12-23 – 2014-12-27 (×5): 50 mg via ORAL
  Filled 2014-12-23 (×6): qty 1

## 2014-12-23 MED ORDER — ASPIRIN EC 81 MG PO TBEC
81.0000 mg | DELAYED_RELEASE_TABLET | Freq: Every day | ORAL | Status: DC
  Administered 2014-12-24 – 2014-12-26 (×3): 81 mg via ORAL
  Filled 2014-12-23 (×4): qty 1

## 2014-12-23 MED ORDER — TIOTROPIUM BROMIDE MONOHYDRATE 18 MCG IN CAPS
18.0000 ug | ORAL_CAPSULE | Freq: Every day | RESPIRATORY_TRACT | Status: DC
  Administered 2014-12-24 – 2014-12-26 (×3): 18 ug via RESPIRATORY_TRACT
  Filled 2014-12-23: qty 5

## 2014-12-23 MED ORDER — ALBUTEROL SULFATE (2.5 MG/3ML) 0.083% IN NEBU
2.5000 mg | INHALATION_SOLUTION | Freq: Four times a day (QID) | RESPIRATORY_TRACT | Status: AC
  Administered 2014-12-23 – 2014-12-25 (×6): 2.5 mg via RESPIRATORY_TRACT
  Filled 2014-12-23 (×6): qty 3

## 2014-12-23 MED ORDER — ACETAMINOPHEN 500 MG PO TABS: 500.0000 mg | ORAL_TABLET | Freq: Four times a day (QID) | ORAL | Status: DC | PRN

## 2014-12-23 MED ORDER — POTASSIUM CHLORIDE CRYS ER 20 MEQ PO TBCR
20.0000 meq | EXTENDED_RELEASE_TABLET | Freq: Every day | ORAL | Status: DC
  Administered 2014-12-23 – 2014-12-27 (×5): 20 meq via ORAL
  Filled 2014-12-23 (×5): qty 1

## 2014-12-23 MED ORDER — AMLODIPINE BESYLATE 5 MG PO TABS
5.0000 mg | ORAL_TABLET | Freq: Every day | ORAL | Status: DC
  Administered 2014-12-24 – 2014-12-27 (×4): 5 mg via ORAL
  Filled 2014-12-23 (×4): qty 1

## 2014-12-23 MED ORDER — BUDESONIDE-FORMOTEROL FUMARATE 160-4.5 MCG/ACT IN AERO
2.0000 | INHALATION_SPRAY | Freq: Two times a day (BID) | RESPIRATORY_TRACT | Status: DC
  Administered 2014-12-23 – 2014-12-24 (×2): 2 via RESPIRATORY_TRACT
  Filled 2014-12-23: qty 6

## 2014-12-23 NOTE — Progress Notes (Signed)
ANTIBIOTIC CONSULT NOTE - INITIAL  Pharmacy Consult for Levaquin Indication: bronchitis  No Known Allergies  Patient Measurements:   Adjusted Body Weight:   Vital Signs: Temp: 98.4 F (36.9 C) (12/15 1202) Temp Source: Oral (12/15 1202) BP: 157/105 mmHg (12/15 1330) Pulse Rate: 85 (12/15 1330) Intake/Output from previous day:   Intake/Output from this shift:    Labs:  Recent Labs  12/23/14 1054  WBC 14.2*  HGB 15.4  PLT 307  CREATININE 0.80   CrCl cannot be calculated (Unknown ideal weight.). No results for input(s): VANCOTROUGH, VANCOPEAK, VANCORANDOM, GENTTROUGH, GENTPEAK, GENTRANDOM, TOBRATROUGH, TOBRAPEAK, TOBRARND, AMIKACINPEAK, AMIKACINTROU, AMIKACIN in the last 72 hours.   Microbiology: Recent Results (from the past 720 hour(s))  Sputum culture     Status: Abnormal   Collection Time: 11/24/14 12:00 AM  Result Value Ref Range Status   Lower Respiratory Culture Final report (A)  Final   Result 1 Acinetobacter lwoffii (A)  Final    Comment: Heavy growth   Antimicrobial Susceptibility Comment  Final    Comment:       ** S = Susceptible; I = Intermediate; R = Resistant **                    P = Positive; N = Negative             MICS are expressed in micrograms per mL    Antibiotic                 RSLT#1    RSLT#2    RSLT#3    RSLT#4 Cefepime                       S Ceftriaxone                    I Cefuroxime                     I Ciprofloxacin                  S Gentamicin                     S Imipenem                       S Levofloxacin                   S Piperacillin                   S Tetracycline                   S Tobramycin                     S Trimethoprim/Sulfa             S     Medical History: Past Medical History  Diagnosis Date  . COPD (chronic obstructive pulmonary disease) ([place])     former smoker  . GERD (gastroesophageal reflux disease)   . CAD (coronary artery disease), native coronary artery 6/11, 2/14, 6/14; 7/15    a.  thought due to vasospasm . cath - CAD LAD 40-50%, LCX 40%, RCA 25% EF 65%. lexiscan myoview EF 68% w/o evidence of ischemia or infarct b. STEMI s/p BMS-mid RCA c. BMS x 2- prox RCA, PLOM  . HTN (hypertension)   . Asthma   .  Hyperlipidemia   . Tobacco abuse   . Allergic rhinitis    Assessment: 23 yoM with history of multiple COPD exacerbations and CAD with stent placement in 2014 presents with chest pain and mild COPD exacerbation with productive cough.  Starting on IV solu-medrol and PO Levaquin.  Goal of Therapy:  Doses adjusted per renal function Eradication of infection  Plan:  Levaquin 500 mg PO daily.  Do not anticipate further dose adjustments so pharmacy will sign off at this time.  Please reconsult if a change in clinical status warrants re-evaluation of dosage.  Hershal Coria 12/23/2014,2:06 PM

## 2014-12-23 NOTE — Telephone Encounter (Signed)
I have spoken with [name] about this. SN has an opening this morning. Pt is going to be scheduled with SN. Nothing further was needed.

## 2014-12-23 NOTE — Consult Note (Signed)
CARDIOLOGY CONSULT NOTE   Patient ID: Graesyn Ruszczyk MRN: [id number] DOB/AGE: 1945-08-03 69 y.o.  Admit date: 12/23/2014  Primary Physician   [name], MD Primary Cardiologist   [name] Reason for Consultation   Chest pain  [id number] Hinzman is a 69 y.o. year old male with a history of COPD, STEMI w/ BMS RCA 02/2012, BMS pRCA & OM 06/2012, non-obs CAD by cath 07/2013, HTN, HL, GERD, intol BB w/ wheezing. Not seen in office since 08/2013.   Interpreter phone used to talk to pt.  Mr. Diponio does not normally get chest pain. Before the weather got cold, he was walking 20-30 minutes several times a week. He states he goes slow but can keep walking. His breathing is never very good and he constantly struggles with it.   He was seen 11/14 by Dr. Curt Jews and treated for a COPD exacerbation. However, over the last several weeks, his breathing has gotten worse.  Pt has been struggling with worsening SOB, DOE, cough. Pt denies fevers. The cough is productive of sputum that is described as yellow and white at times. His chronic dyspnea on exertion has worsened and he also has chronic orthopnea that is occurring more frequently.  Yesterday and today, he had an episode of chest pain. When asked about the chest pain, he cannot truly ever say that the pain was to Cipro, but the episodes yesterday and today were much worse. Because the pain was severe, he took sublingual nitroglycerin. Each episode was controlled by 2 sublingual nitroglycerin. However, he felt like he was getting worse so he came to the emergency room today.  In the emergency room, he has had aspirin 324 mg, Levaquin, Solu-Medrol 125 mg and nitroglycerin ointment. He states his breathing is much better and he does not currently feel that he is having any chest pain. He is not back to baseline, but is improved from arrival.   Past Medical History  Diagnosis Date  . COPD (chronic obstructive pulmonary disease) ([place])     former  smoker  . GERD (gastroesophageal reflux disease)   . CAD (coronary artery disease), native coronary artery 6/11, 2/14, 6/14; 7/15    a. thought due to vasospasm . cath - CAD LAD 40-50%, LCX 40%, RCA 25% EF 65%. lexiscan myoview EF 68% w/o evidence of ischemia or infarct b. STEMI s/p BMS-mid RCA c. BMS x 2- prox RCA, PLOM  . HTN (hypertension)   . Asthma   . Hyperlipidemia   . Tobacco abuse   . Allergic rhinitis      Past Surgical History  Procedure Laterality Date  . Left foot surgery      repair - and ankle   . Coronary angioplasty with stent placement  02/15/12    20% distal LM, 30-40% mid LAD, 50% ostial diag, 50-60% mid LCx, 99% focal mid RCA stenosis s/p BMS; 20% distal RCA disease, 50% prox PLB; LVEF 55-65%  . Coronary angioplasty with stent placement  07/01/12    30% prox LAD, 30% mid LAD, 20% mid LCx, 90% prox RCA s/p BMS, patent mid RCA stent, 30-40% distal RCA, 80% PLB s/p BMS; EF 55-65%  . Cardiac catheterization  08/03/13    non obstructive disease LCX 20%; LAD 20%; stents in prox RCA and mid are patent, mild in stent stenosis POM, 30%, normal EF 55-65%  . Left heart catheterization with coronary angiogram N/A 02/15/2012    Procedure: LEFT HEART CATHETERIZATION WITH CORONARY ANGIOGRAM;  Surgeon: [name]  M [name], MD;  Location: [facility] CATH LAB;  Service: Cardiovascular;  Laterality: N/A;  . Percutaneous coronary stent intervention (pci-s) N/A 02/15/2012    Procedure: PERCUTANEOUS CORONARY STENT INTERVENTION (PCI-S);  Surgeon: Lowry Bala M [name], MD;  Location: [facility] CATH LAB;  Service: Cardiovascular;  Laterality: N/A;  . Left heart catheterization with coronary angiogram N/A 07/01/2012    Procedure: LEFT HEART CATHETERIZATION WITH CORONARY ANGIOGRAM;  Surgeon: Jerardo Costabile M [name], MD;  Location: [facility] CATH LAB;  Service: Cardiovascular;  Laterality: N/A;  . Percutaneous coronary stent intervention (pci-s)  07/01/2012    Procedure: PERCUTANEOUS CORONARY STENT INTERVENTION (PCI-S);  Surgeon: Palmyra Rogacki M [name],  MD;  Location: [facility] CATH LAB;  Service: Cardiovascular;;  . Left heart catheterization with coronary angiogram N/A 08/03/2013    Procedure: LEFT HEART CATHETERIZATION WITH CORONARY ANGIOGRAM;  Surgeon: Erlean Mealor M [name], MD;  Location: [facility] CATH LAB;  Service: Cardiovascular;  Laterality: N/A;    No Known Allergies  I have reviewed the patient's current medications . levofloxacin  500 mg Oral Daily  . methylPREDNISolone (SOLU-MEDROL) injection  60 mg Intravenous TID  . nitroGLYCERIN  0.5 inch Topical 3 times per day   Medication Sig  acetaminophen (TYLENOL) 500 MG tablet Take 500 mg by mouth every 6 (six) hours as needed for mild pain.  albuterol (PROVENTIL HFA) 108 (90 BASE) MCG/ACT inhaler Inhale 2 puffs into the lungs 2 (two) times daily.  albuterol (PROVENTIL) (2.5 MG/3ML) 0.083% nebulizer solution USE 1 VIAL IN NEBULIZER TWICE A DAY AS NEEDED  amLODipine (NORVASC) 5 MG tablet Take 1 tablet (5 mg total) by mouth daily.  aspirin 81 MG tablet Take 81 mg by mouth daily.   budesonide-formoterol (SYMBICORT) 160-4.5 MCG/ACT inhaler Inhale 2 puffs into the lungs 2 (two) times daily.  guaiFENesin (MUCINEX) 600 MG 12 hr tablet Take 600 mg by mouth 2 (two) times daily as needed for cough or to loosen phlegm.  ibuprofen (ADVIL,MOTRIN) 200 MG tablet Take 400 mg by mouth every 6 (six) hours as needed for moderate pain.  naproxen sodium (ANAPROX) 220 MG tablet Take 220 mg by mouth every 12 (twelve) hours as needed (for pain).  nitroGLYCERIN (NITROSTAT) 0.4 MG SL tablet Place 1 tablet (0.4 mg total) under the tongue every 5 (five) minutes as needed for chest pain.  omeprazole (PRILOSEC) 20 MG capsule TAKE 1 CAPSULE (20 MG TOTAL) BY MOUTH 2 (TWO) TIMES DAILY BEFORE A MEAL.  pantoprazole (PROTONIX) 40 MG tablet Take 40 mg by mouth daily.  potassium chloride SA (KLOR-CON M20) 20 MEQ tablet Take 1 tablet (20 mEq total) by mouth daily. NEEDS TO MAKE APPOINTMENT WITH PRIMARY CARDIOLOGIST FOR FURTHER REFILLS 336  [phone]  predniSONE (DELTASONE) 10 MG tablet Take 40mg  po daily for 3 days, then take 30mg  po daily for 3 days, then take 20mg  po daily for two days, then take 10mg  po daily for 2 days  Spacer/Aero-Holding Chambers (AEROCHAMBER MV) inhaler Use as instructed  tiotropium (SPIRIVA HANDIHALER) 18 MCG inhalation capsule Place 1 capsule (18 mcg total) into inhaler and inhale daily.  ezetimibe (ZETIA) 10 MG tablet Take 1 tablet (10 mg total) by mouth daily. Patient not taking: Reported on 12/23/2014  losartan (COZAAR) 50 MG tablet Take 1 tablet (50 mg total) by mouth daily. Patient not taking: Reported on 12/23/2014     Social History   Social History  . Marital Status: Married    Spouse Name: N/A  . Number of Children: N/A  . Years of Education: N/A   Occupational History  .  disability    Social History Main Topics  . Smoking status: Former Smoker -- 1.00 packs/day for 50 years    Types: Cigarettes    Quit date: 01/09/2008  . Smokeless tobacco: Never Used     Comment: rolled own cigarettes since age of 63,   . Alcohol Use: No     Comment: occassional   . Drug Use: No  . Sexual Activity: Not Currently     Comment: pt sts he only smoke 1 cigarette, not every day   Other Topics Concern  . Not on file   Social History Narrative   Married, 4 children.   Disability secondary to lung disease.    No known family history of premature CAD.    Family Status  Relation Status Death Age  . Mother Deceased     unknown  . Father Deceased     unknown  . Maternal Grandmother Deceased   . Maternal Grandfather Deceased   . Paternal Grandmother Deceased   . Paternal Grandfather Deceased    ROS:  Full 14 point review of systems complete and found to be negative unless listed above.  Physical Exam: Blood pressure 143/95, pulse 74, temperature 98.4 F (36.9 C), temperature source Oral, resp. rate 19, SpO2 97 %.  General: Well developed, well nourished, male in moderate respiratory  distress; he uses accessory muscles to breathe and will frequently lift his chin when taking a breath Head: Eyes PERRLA, No xanthomas.   Normocephalic and atraumatic, oropharynx without edema or exudate. Dentition:  poor  Lungs:  limited air exchange bilaterally with wheezing and rales noted  Heart: HRRR S1 S2, no rub/gallop,  no murmur. pulses are 2+ all 4 extrem.   Neck: No carotid bruits. No lymphadenopathy.  JVD 8-9 centimeters. Abdomen: Bowel sounds present, abdomen soft and non-tender without masses or hernias noted. Msk:  No spine or cva tenderness. No weakness, no joint deformities or effusions. Extremities: No clubbing or cyanosis.  no edema.  Neuro: Alert and oriented X 3. No focal deficits noted. Psych:  Good affect, responds appropriately Skin: No rashes or lesions noted.  Labs:   Lab Results  Component Value Date   WBC 14.2* 12/23/2014   HGB 15.4 12/23/2014   HCT 46.3 12/23/2014   MCV 91.5 12/23/2014   PLT 307 12/23/2014     Recent Labs Lab 12/23/14 1054  NA 141  K 3.3*  CL 103  CO2 29  BUN 15  CREATININE 0.80  CALCIUM 8.6*  PROT 6.5  BILITOT 0.8  ALKPHOS 67  ALT 45  AST 19  GLUCOSE 90  ALBUMIN 3.7   MAGNESIUM  Date Value Ref Range Status  12/21/2010 2.3 1.5 - 2.5 mg/dL Final    Recent Labs  12/23/14 1342  TROPONINI <0.03    Recent Labs  12/23/14 1100  TROPIPOC 0.02   B NATRIURETIC PEPTIDE  Date/Time Value Ref Range Status  12/23/2014 10:54 AM 45.4 0.0 - 100.0 pg/mL Final   LIPASE  Date/Time Value Ref Range Status  12/23/2014 10:54 AM 22 11 - 51 U/L Final   TSH  Date/Time Value Ref Range Status  03/26/2013 09:50 AM 0.24* 0.35 - 5.50 uIU/mL Final   T3, FREE  Date/Time Value Ref Range Status  06/05/2009 05:26 AM 2.1* 2.3 - 4.2 pg/mL Final   Echo:  06/08/2009 Study Conclusions Left ventricle: The cavity size was normal. Wall thickness was increased in a pattern of mild LVH. The estimated ejection fraction was 65%. Wall motion  was  normal; there were no regional wall motion abnormalities. Doppler parameters are consistent with abnormal left ventricular relaxation (grade 1 diastolic dysfunction).  ECG:  12/23/2014 Sinus rhythm, no acute ischemic changes, no change from ECG dated 04/30/2014  Radiology:  Dg Chest Port 1 View 12/23/2014  CLINICAL DATA:  Chest pain and shortness of Breath EXAM: PORTABLE CHEST - 1 VIEW COMPARISON:  11/22/2014 FINDINGS: The heart size and mediastinal contours are within normal limits. Both lungs are clear. The visualized skeletal structures are unremarkable. IMPRESSION: No acute abnormality noted. Electronically Signed   By: Inez Catalina M.D.   On: 12/23/2014 11:30    ASSESSMENT AND PLAN:   The patient was seen today by Dr. Johnsie Cancel, the patient evaluated and the data reviewed.   Principal Problem:   Chest pain, negative MI, secondary due to COPD exacerbation  - Initial enzymes are negative and ECG is unchanged. - However, he has a history of multiple cardiac stents and his symptoms improved with nitroglycerin -  would continue aspirin, nitrates, Zetia and Cozaar - No beta blocker as this has worsened his wheezing in the past  - EF previously normal, repeat echo pending  - Chest pain was triggered by his COPD exacerbation but it is at least moderately likely to be angina.  - Continue to cycle enzymes, add heparin if they become elevated.  - Do not feel stress testing is appropriate in this patient as he has significant respiratory difficulties and cardiac history.  - Cardiac catheterization is indicated. His respiratory status is unlikely to improve enough to allow the procedure to be performed by tomorrow, we'll schedule for Monday.    Otherwise, per IM  Active Problems:   GERD   Essential hypertension   Tobacco abuse, has stopped   CAD (coronary artery disease), native coronary artery   Uncontrolled diabetes mellitus without complication ([place])   COPD with exacerbation    Signed: Rosaria Ferries, PA-C 12/23/2014 4:02 PM Beeper [id number]  Co-Sign MD  Patient examined chart reviewed Known CAD with previous stenting of RCA and OM.  Last cath 07/2013 patent.   Worsening COPD last month.  Admitted with impressive tachypnea.  Wheezing and poor air movement.  SSCP  Relieved with nitro.  Currently no good way to non-invasively stress patient. Discussed with patient via phone  Intepreter.  Tightness was improvement with nitro.  Tentatively will plan diagnostic cath on Monday after pulmonary Status is improved with iv steroids and Rx's  Would have pulmonary see in house since Dr Lake Bells has been following as outpatient.    Jenkins Rouge

## 2014-12-23 NOTE — H&P (Addendum)
Patient Demographics:    [name], is a 69 y.o. male  MRN: [id number]   DOB - [birth year]  Admit Date - 12/23/2014  Outpatient Primary MD for the patient is [name], MD   With History of -  Past Medical History  Diagnosis Date  . COPD (chronic obstructive pulmonary disease) ([place])     former smoker  . GERD (gastroesophageal reflux disease)   . CAD (coronary artery disease), native coronary artery 6/11, 2/14, 6/14; 7/15    a. thought due to vasospasm . cath - CAD LAD 40-50%, LCX 40%, RCA 25% EF 65%. lexiscan myoview EF 68% w/o evidence of ischemia or infarct b. STEMI s/p BMS-mid RCA c. BMS x 2- prox RCA, PLOM  . HTN (hypertension)   . Asthma   . Hyperlipidemia   . Tobacco abuse   . Allergic rhinitis       Past Surgical History  Procedure Laterality Date  . Left foot surgery      repair - and ankle   . Coronary angioplasty with stent placement  02/15/12    20% distal LM, 30-40% mid LAD, 50% ostial diag, 50-60% mid LCx, 99% focal mid RCA stenosis s/p BMS; 20% distal RCA disease, 50% prox PLB; LVEF 55-65%  . Coronary angioplasty with stent placement  07/01/12    30% prox LAD, 30% mid LAD, 20% mid LCx, 90% prox RCA s/p BMS, patent mid RCA stent, 30-40% distal RCA, 80% PLB s/p BMS; EF 55-65%  . Cardiac catheterization  08/03/13    non obstructive disease LCX 20%; LAD 20%; stents in prox RCA and mid are patent, mild in stent stenosis POM, 30%, normal EF 55-65%  . Left heart catheterization with coronary angiogram N/A 02/15/2012    Procedure: LEFT HEART CATHETERIZATION WITH CORONARY ANGIOGRAM;  Surgeon: Peter M [name], MD;  Location: [facility] CATH LAB;  Service: Cardiovascular;  Laterality: N/A;  . Percutaneous coronary stent intervention (pci-s) N/A 02/15/2012    Procedure: PERCUTANEOUS CORONARY STENT INTERVENTION  (PCI-S);  Surgeon: Peter M [name], MD;  Location: [facility] CATH LAB;  Service: Cardiovascular;  Laterality: N/A;  . Left heart catheterization with coronary angiogram N/A 07/01/2012    Procedure: LEFT HEART CATHETERIZATION WITH CORONARY ANGIOGRAM;  Surgeon: Peter M [name], MD;  Location: [facility] CATH LAB;  Service: Cardiovascular;  Laterality: N/A;  . Percutaneous coronary stent intervention (pci-s)  07/01/2012    Procedure: PERCUTANEOUS CORONARY STENT INTERVENTION (PCI-S);  Surgeon: Peter M [name], MD;  Location: [facility] CATH LAB;  Service: Cardiovascular;;  . Left heart catheterization with coronary angiogram N/A 08/03/2013    Procedure: LEFT HEART CATHETERIZATION WITH CORONARY ANGIOGRAM;  Surgeon: Peter M [name], MD;  Location: [facility] CATH LAB;  Service: Cardiovascular;  Laterality: N/A;    in for   Chief Complaint  Patient presents with  . Chest Pain  . Shortness of Breath      HPI:    [name]  is  a 69 y.o. male, with history of COPD, not on home oxygen but has had multiple COPD exacerbations, CAD status post bare metal stent placement in 2014, GERD, essential hypertension, dyslipidemia, history of tobacco abuse says he has quit several years ago. Who has had several COPD exacerbations last one 1 month ago under the care of Dr. Lake Bells comes in with chief complaints of productive cough for the last few days, gradually worsening shortness of breath and some left-sided sharp chest pain which is nonradiating, present for 24 hours, worsened by activity better with rest, says this is similar to his previous MI. Rates the pain currently 2 out of 10 after he received some nitroglycerin, his chest pain improved considerably after nitroglycerin.  In the ER he was diagnosed with chest pain, mild COPD exacerbation and I was called to admit the patient.    Review of systems:    In addition to the HPI above,   No Fever-chills, No Headache, No changes with Vision or hearing, No problems swallowing food or Liqwas  active Chest pain,  positive productive cough with shortness of breath as above,  No Abdominal pain, No Nausea or Vommitting, Bowel movements are regular, No Blood in stool or Urine, No dysuria, No new skin rashes or bruises, No new joints pains-aches,  No new weakness, tingling, numbness in any extremity, No recent weight gain or loss, No polyuria, polydypsia or polyphagia, No significant Mental Stressors.  A full 10 point Review of Systems was done, except as stated above, all other Review of Systems were negative.    Social History:     Social History  Substance Use Topics  . Smoking status: Former Smoker -- 1.00 packs/day for 50 years    Types: Cigarettes    Quit date: 01/09/2008  . Smokeless tobacco: Never Used     Comment: rolled own cigarettes since age of 58,   . Alcohol Use: No     Comment: occassional     Lives - at home with his family   Family History :   No history of CAD that he knows   Home Medications:   Prior to Admission medications   Medication Sig Start Date End Date Taking? Authorizing Provider  acetaminophen (TYLENOL) 500 MG tablet Take 500 mg by mouth every 6 (six) hours as needed for mild pain.   Yes Historical Provider, MD  albuterol (PROVENTIL HFA) 108 (90 BASE) MCG/ACT inhaler Inhale 2 puffs into the lungs 2 (two) times daily. 05/06/14  Yes Robbie Lis, MD  albuterol (PROVENTIL) (2.5 MG/3ML) 0.083% nebulizer solution USE 1 VIAL IN NEBULIZER TWICE A DAY AS NEEDED 05/06/14  Yes Robbie Lis, MD  amLODipine (NORVASC) 5 MG tablet Take 1 tablet (5 mg total) by mouth daily. 07/29/14  Yes Peter M [name], MD  aspirin 81 MG tablet Take 81 mg by mouth daily.    Yes Historical Provider, MD  budesonide-formoterol (SYMBICORT) 160-4.5 MCG/ACT inhaler Inhale 2 puffs into the lungs 2 (two) times daily. 05/06/14  Yes Robbie Lis, MD  guaiFENesin (MUCINEX) 600 MG 12 hr tablet Take 600 mg by mouth 2 (two) times daily as needed for cough or to loosen phlegm.   Yes  Historical Provider, MD  ibuprofen (ADVIL,MOTRIN) 200 MG tablet Take 400 mg by mouth every 6 (six) hours as needed for moderate pain.   Yes Historical Provider, MD  naproxen sodium (ANAPROX) 220 MG tablet Take 220 mg by mouth every 12 (twelve) hours as needed (for pain).  Yes Historical Provider, MD  nitroGLYCERIN (NITROSTAT) 0.4 MG SL tablet Place 1 tablet (0.4 mg total) under the tongue every 5 (five) minutes as needed for chest pain. 06/18/12  Yes Peter M [name], MD  omeprazole (PRILOSEC) 20 MG capsule TAKE 1 CAPSULE (20 MG TOTAL) BY MOUTH 2 (TWO) TIMES DAILY BEFORE A MEAL. 09/10/14  Yes Isaiah Serge, NP  pantoprazole (PROTONIX) 40 MG tablet Take 40 mg by mouth daily.   Yes Historical Provider, MD  potassium chloride SA (KLOR-CON M20) 20 MEQ tablet Take 1 tablet (20 mEq total) by mouth daily. NEEDS TO MAKE APPOINTMENT WITH PRIMARY CARDIOLOGIST FOR FURTHER REFILLS [phone] 09/21/14  Yes Peter M [name], MD  predniSONE (DELTASONE) 10 MG tablet Take 40mg  po daily for 3 days, then take 30mg  po daily for 3 days, then take 20mg  po daily for two days, then take 10mg  po daily for 2 days 11/22/14  Yes Juanito Doom, MD  Spacer/Aero-Holding Chambers (AEROCHAMBER MV) inhaler Use as instructed 11/22/14  Yes Juanito Doom, MD  tiotropium (SPIRIVA HANDIHALER) 18 MCG inhalation capsule Place 1 capsule (18 mcg total) into inhaler and inhale daily. 05/06/14  Yes Robbie Lis, MD  ezetimibe (ZETIA) 10 MG tablet Take 1 tablet (10 mg total) by mouth daily. Patient not taking: Reported on 12/23/2014 07/07/13   Peter M [name], MD  losartan (COZAAR) 50 MG tablet Take 1 tablet (50 mg total) by mouth daily. Patient not taking: Reported on 12/23/2014 09/18/13   Elsie Stain, MD     Allergies:    No Known Allergies   Physical Exam:   Vitals  Blood pressure 133/89, pulse 76, temperature 98.4 F (36.9 C), temperature source Oral, resp. rate 20, SpO2 95 %.   1. General middle-aged Asian male lying in  bed in mild shortness of breath  2. Normal affect and insight, Not Suicidal or Homicidal, Awake Alert, Oriented X 3.  3. No F.N deficits, ALL C.Nerves Intact, Strength 5/5 all 4 extremities, Sensation intact all 4 extremities, Plantars down going.  4. Ears and Eyes appear Normal, Conjunctivae clear, PERRLA. Moist Oral Mucosa.  5. Supple Neck, No JVD, No cervical lymphadenopathy appriciated, No Carotid Bruits.  6. Symmetrical Chest wall movement, Minimal air movement bilaterally, mild expiratory wheezing  7. RRR, No Gallops, Rubs or Murmurs, No Parasternal Heave.  8. Positive Bowel Sounds, Abdomen Soft, No tenderness, No organomegaly appriciated,No rebound -guarding or rigidity.  9.  No Cyanosis, Normal Skin Turgor, No Skin Rash or Bruise.  10. Good muscle tone,  joints appear normal , no effusions, Normal ROM.  11. No Palpable Lymph Nodes in Neck or Axillae      Data Review:    CBC  Recent Labs Lab 12/23/14 1054  WBC 14.2*  HGB 15.4  HCT 46.3  PLT 307  MCV 91.5  MCH 30.4  MCHC 33.3  RDW 14.5  LYMPHSABS 2.3  MONOABS 0.8  EOSABS 0.3  BASOSABS 0.0   ------------------------------------------------------------------------------------------------------------------  Chemistries   Recent Labs Lab 12/23/14 1054  NA 141  K 3.3*  CL 103  CO2 29  GLUCOSE 90  BUN 15  CREATININE 0.80  CALCIUM 8.6*  AST 19  ALT 45  ALKPHOS 67  BILITOT 0.8   ------------------------------------------------------------------------------------------------------------------ CrCl cannot be calculated (Unknown ideal weight.). ------------------------------------------------------------------------------------------------------------------ No results for input(s): TSH, T4TOTAL, T3FREE, THYROIDAB in the last 72 hours.  Invalid input(s): FREET3   Coagulation profile No results for input(s): INR, PROTIME in the last 168  hours. -------------------------------------------------------------------------------------------------------------------  No results for input(s): DDIMER in the last 72 hours. -------------------------------------------------------------------------------------------------------------------  Cardiac Enzymes No results for input(s): CKMB, TROPONINI, MYOGLOBIN in the last 168 hours.  Invalid input(s): CK ------------------------------------------------------------------------------------------------------------------ Invalid input(s): POCBNP   ---------------------------------------------------------------------------------------------------------------  Urinalysis    Component Value Date/Time   COLORURINE YELLOW 04/30/2014 2058   APPEARANCEUR CLEAR 04/30/2014 2058   LABSPEC 1.020 04/30/2014 2058   PHURINE 5.5 04/30/2014 2058   GLUCOSEU NEGATIVE 04/30/2014 2058   HGBUR NEGATIVE 04/30/2014 2058   BILIRUBINUR NEGATIVE 04/30/2014 2058   KETONESUR NEGATIVE 04/30/2014 2058   PROTEINUR NEGATIVE 04/30/2014 2058   UROBILINOGEN 0.2 04/30/2014 2058   NITRITE NEGATIVE 04/30/2014 2058   LEUKOCYTESUR NEGATIVE 04/30/2014 2058    ----------------------------------------------------------------------------------------------------------------   Imaging Results:    Dg Chest Port 1 View  12/23/2014  CLINICAL DATA:  Chest pain and shortness of Breath EXAM: PORTABLE CHEST - 1 VIEW COMPARISON:  11/22/2014 FINDINGS: The heart size and mediastinal contours are within normal limits. Both lungs are clear. The visualized skeletal structures are unremarkable. IMPRESSION: No acute abnormality noted. Electronically Signed   By: Inez Catalina M.D.   On: 12/23/2014 11:30    My personal review of EKG: Rhythm NSR,   no Acute ST changes   Assessment & Plan:     1. Chest pain in a patient with CAD and stent placement in 2014. Says his pain characteristic is similar to his MI in the past, will be admitted to  a telemetry bed, cycle troponin, place on aspirin and Nitropaste, not on beta blocker due to severe COPD, check echogram, cardiology has been consulted already by ER.    2. Advanced COPD with mild exacerbation at the very minimum. Patient is moving minimum air bilaterally, although he is not requiring oxygen he does have a productive cough and likely acute bronchitis exacerbating his COPD. Will get sputum Gram stain culture, place him on Solu-Medrol along with Levaquin. Oxygen and nebulizer treatments as needed.   3. Dyslipidemia. Continue home medications.    4. GERD. On PPI continue.   5. Mild leukocytosis. Reactionary due to #2 above. Treatment as in #2 above.    6. Essential hypertension. Home dose ARB and Norvasc continued.   7. Hypokalemia. Replaced.    DVT Prophylaxis  Lovenox   AM Labs Ordered, also please review Full Orders  Family Communication: Admission, patients condition and plan of care including tests being ordered have been discussed with the patient  who indicates understanding and agree with the plan and Code Status.  Code Status Full  Likely DC to  Home 2-3 days  Condition fair  Time spent in minutes : 35    Audry Kauzlarich K M.D on 12/23/2014 at 1:20 PM  Between 7am to 7pm - Pager - [phone]  After 7pm go to [email or web address] - password [facility]  [facility] - Office  [phone]

## 2014-12-23 NOTE — ED Notes (Addendum)
Patient states he has had SOB x10 years, but last night began having epigastric pain.  Patient denies N/V.  Patient endorses cough productive of Lyall Faciane sputum.  Patient states his chest is burning.  Patient took 2 nitroglycerin tabs PTA with some relief.  Patient states pain is worse with exertion.

## 2014-12-23 NOTE — ED Provider Notes (Signed)
CSN: [id number]     Arrival date & time 12/23/14  1022 History   First MD Initiated Contact with Patient 12/23/14 1032     Chief Complaint  Patient presents with  . Chest Pain  . Shortness of Breath     (Consider location/radiation/quality/duration/timing/severity/associated sxs/prior Treatment) HPI Patient presents with inferior chest pain starting last night. Describes the pain is similar to pain patient had with previous stent placement. States he took a nitroglycerin yesterday evening with improvement of symptoms. Pain worsened this morning. He took 2 nitroglycerin prior to arrival. Pain is currently 2/10. Patient has ongoing shortness of breath for a month. Endorses wheezing and cough productive of brown sputum. No fever or chills. No worsening of shortness of breath for the past few days. No new lower sugary swelling or pain. Dr. [name] is patient's cardiologist. Past Medical History  Diagnosis Date  . COPD (chronic obstructive pulmonary disease) ([place])     former smoker  . GERD (gastroesophageal reflux disease)   . CAD (coronary artery disease), native coronary artery 6/11, 2/14, 6/14; 7/15    a. thought due to vasospasm . cath - CAD LAD 40-50%, LCX 40%, RCA 25% EF 65%. lexiscan myoview EF 68% w/o evidence of ischemia or infarct b. STEMI s/p BMS-mid RCA c. BMS x 2- prox RCA, PLOM  . HTN (hypertension)   . Asthma   . Hyperlipidemia   . Tobacco abuse   . Allergic rhinitis    Past Surgical History  Procedure Laterality Date  . Left foot surgery      repair - and ankle   . Coronary angioplasty with stent placement  02/15/12    20% distal LM, 30-40% mid LAD, 50% ostial diag, 50-60% mid LCx, 99% focal mid RCA stenosis s/p BMS; 20% distal RCA disease, 50% prox PLB; LVEF 55-65%  . Coronary angioplasty with stent placement  07/01/12    30% prox LAD, 30% mid LAD, 20% mid LCx, 90% prox RCA s/p BMS, patent mid RCA stent, 30-40% distal RCA, 80% PLB s/p BMS; EF 55-65%  . Cardiac  catheterization  08/03/13    non obstructive disease LCX 20%; LAD 20%; stents in prox RCA and mid are patent, mild in stent stenosis POM, 30%, normal EF 55-65%  . Left heart catheterization with coronary angiogram N/A 02/15/2012    Procedure: LEFT HEART CATHETERIZATION WITH CORONARY ANGIOGRAM;  Surgeon: Peter M [name], MD;  Location: [facility] CATH LAB;  Service: Cardiovascular;  Laterality: N/A;  . Percutaneous coronary stent intervention (pci-s) N/A 02/15/2012    Procedure: PERCUTANEOUS CORONARY STENT INTERVENTION (PCI-S);  Surgeon: Peter M [name], MD;  Location: [facility] CATH LAB;  Service: Cardiovascular;  Laterality: N/A;  . Left heart catheterization with coronary angiogram N/A 07/01/2012    Procedure: LEFT HEART CATHETERIZATION WITH CORONARY ANGIOGRAM;  Surgeon: Peter M [name], MD;  Location: [facility] CATH LAB;  Service: Cardiovascular;  Laterality: N/A;  . Percutaneous coronary stent intervention (pci-s)  07/01/2012    Procedure: PERCUTANEOUS CORONARY STENT INTERVENTION (PCI-S);  Surgeon: Peter M [name], MD;  Location: [facility] CATH LAB;  Service: Cardiovascular;;  . Left heart catheterization with coronary angiogram N/A 08/03/2013    Procedure: LEFT HEART CATHETERIZATION WITH CORONARY ANGIOGRAM;  Surgeon: Peter M [name], MD;  Location: [facility] CATH LAB;  Service: Cardiovascular;  Laterality: N/A;   No family history on file. Social History  Substance Use Topics  . Smoking status: Former Smoker -- 1.00 packs/day for 50 years    Types: Cigarettes    Quit date: 01/09/2008  .  Smokeless tobacco: Never Used     Comment: rolled own cigarettes since age of 30,   . Alcohol Use: No     Comment: occassional     Review of Systems  Constitutional: Negative for fever and chills.  Respiratory: Positive for cough, shortness of breath and wheezing.   Cardiovascular: Positive for chest pain. Negative for palpitations and leg swelling.  Gastrointestinal: Negative for nausea, vomiting, abdominal pain and diarrhea.  Musculoskeletal:  Negative for myalgias, back pain, neck pain and neck stiffness.  Skin: Negative for rash and wound.  Neurological: Negative for dizziness, weakness, light-headedness, numbness and headaches.  All other systems reviewed and are negative.     Allergies  Review of patient's allergies indicates no known allergies.  Home Medications   Prior to Admission medications   Medication Sig Start Date End Date Taking? Authorizing Provider  acetaminophen (TYLENOL) 500 MG tablet Take 500 mg by mouth every 6 (six) hours as needed for mild pain.   Yes Historical Provider, MD  albuterol (PROVENTIL HFA) 108 (90 BASE) MCG/ACT inhaler Inhale 2 puffs into the lungs 2 (two) times daily. 05/06/14  Yes Robbie Lis, MD  albuterol (PROVENTIL) (2.5 MG/3ML) 0.083% nebulizer solution USE 1 VIAL IN NEBULIZER TWICE A DAY AS NEEDED 05/06/14  Yes Robbie Lis, MD  amLODipine (NORVASC) 5 MG tablet Take 1 tablet (5 mg total) by mouth daily. 07/29/14  Yes Peter M [name], MD  aspirin 81 MG tablet Take 81 mg by mouth daily.    Yes Historical Provider, MD  budesonide-formoterol (SYMBICORT) 160-4.5 MCG/ACT inhaler Inhale 2 puffs into the lungs 2 (two) times daily. 05/06/14  Yes Robbie Lis, MD  guaiFENesin (MUCINEX) 600 MG 12 hr tablet Take 600 mg by mouth 2 (two) times daily as needed for cough or to loosen phlegm.   Yes Historical Provider, MD  ibuprofen (ADVIL,MOTRIN) 200 MG tablet Take 400 mg by mouth every 6 (six) hours as needed for moderate pain.   Yes Historical Provider, MD  naproxen sodium (ANAPROX) 220 MG tablet Take 220 mg by mouth every 12 (twelve) hours as needed (for pain).   Yes Historical Provider, MD  nitroGLYCERIN (NITROSTAT) 0.4 MG SL tablet Place 1 tablet (0.4 mg total) under the tongue every 5 (five) minutes as needed for chest pain. 06/18/12  Yes Peter M [name], MD  omeprazole (PRILOSEC) 20 MG capsule TAKE 1 CAPSULE (20 MG TOTAL) BY MOUTH 2 (TWO) TIMES DAILY BEFORE A MEAL. 09/10/14  Yes Isaiah Serge, NP   pantoprazole (PROTONIX) 40 MG tablet Take 40 mg by mouth daily.   Yes Historical Provider, MD  potassium chloride SA (KLOR-CON M20) 20 MEQ tablet Take 1 tablet (20 mEq total) by mouth daily. NEEDS TO MAKE APPOINTMENT WITH PRIMARY CARDIOLOGIST FOR FURTHER REFILLS [phone] 09/21/14  Yes Peter M [name], MD  predniSONE (DELTASONE) 10 MG tablet Take 40mg  po daily for 3 days, then take 30mg  po daily for 3 days, then take 20mg  po daily for two days, then take 10mg  po daily for 2 days 11/22/14  Yes Juanito Doom, MD  Spacer/Aero-Holding Chambers (AEROCHAMBER MV) inhaler Use as instructed 11/22/14  Yes Juanito Doom, MD  tiotropium (SPIRIVA HANDIHALER) 18 MCG inhalation capsule Place 1 capsule (18 mcg total) into inhaler and inhale daily. 05/06/14  Yes Robbie Lis, MD  ezetimibe (ZETIA) 10 MG tablet Take 1 tablet (10 mg total) by mouth daily. Patient not taking: Reported on 12/23/2014 07/07/13   Peter M [name], MD  losartan (COZAAR) 50 MG tablet Take 1 tablet (50 mg total) by mouth daily. Patient not taking: Reported on 12/23/2014 09/18/13   Elsie Stain, MD   BP 133/89 mmHg  Pulse 76  Temp(Src) 98.4 F (36.9 C) (Oral)  Resp 20  SpO2 95% Physical Exam  Constitutional: He is oriented to person, place, and time. He appears well-developed and well-nourished. No distress.  HENT:  Head: Normocephalic and atraumatic.  Mouth/Throat: Oropharynx is clear and moist.  Eyes: EOM are normal. Pupils are equal, round, and reactive to light.  Neck: Normal range of motion. Neck supple.  Cardiovascular: Normal rate and regular rhythm.  Exam reveals no gallop and no friction rub.   No murmur heard. Pulmonary/Chest: Effort normal. No respiratory distress. He has wheezes. He has no rales. He exhibits no tenderness.  Patient with decreased air movement throughout. Expiratory wheezing  Abdominal: Soft. Bowel sounds are normal. He exhibits no distension and no mass. There is no tenderness. There is no  rebound and no guarding.  Musculoskeletal: Normal range of motion. He exhibits no edema or tenderness.  No calf swelling or tenderness.  Neurological: He is alert and oriented to person, place, and time.  Skin: Skin is warm and dry. No rash noted. No erythema.  Psychiatric: He has a normal mood and affect. His behavior is normal.  Nursing note and vitals reviewed.   ED Course  Procedures (including critical care time) Labs Review Labs Reviewed  CBC WITH DIFFERENTIAL/PLATELET - Abnormal; Notable for the following:    WBC 14.2 (*)    Neutro Abs 10.8 (*)    All other components within normal limits  COMPREHENSIVE METABOLIC PANEL - Abnormal; Notable for the following:    Potassium 3.3 (*)    Calcium 8.6 (*)    All other components within normal limits  BRAIN NATRIURETIC PEPTIDE  LIPASE, BLOOD  I-STAT TROPOININ, ED    Imaging Review Dg Chest Port 1 View  12/23/2014  CLINICAL DATA:  Chest pain and shortness of Breath EXAM: PORTABLE CHEST - 1 VIEW COMPARISON:  11/22/2014 FINDINGS: The heart size and mediastinal contours are within normal limits. Both lungs are clear. The visualized skeletal structures are unremarkable. IMPRESSION: No acute abnormality noted. Electronically Signed   By: Inez Catalina M.D.   On: 12/23/2014 11:30   I have personally reviewed and evaluated these images and lab results as part of my medical decision-making.   EKG Interpretation   Date/Time:  Thursday December 23 2014 10:35:28 EST Ventricular Rate:  82 PR Interval:  141 QRS Duration: 79 QT Interval:  370 QTC Calculation: 432 R Axis:   77 Text Interpretation:  Sinus rhythm Anteroseptal infarct, age indeterminate  Confirmed by Lita Mains  MD, Farrell Broerman ([id number]) on 12/23/2014 10:51:29 AM Also  confirmed by Lita Mains  MD, Zanasia Hickson ([id number])  on 12/23/2014 1:04:03 PM      MDM   Final diagnoses:  Chest pain, unspecified chest pain type  COPD exacerbation (HCC)   Chest pain is completely resolved. Initial  troponin is normal Discussed with cardiology and will see patient in emergency department. Asked to have [facility] consulted for admission.  Discussed with Dr. Candiss Norse. Will admit to telemetry bed.  Julianne Rice, MD 12/23/14 [phone]

## 2014-12-23 NOTE — Progress Notes (Signed)
Echocardiogram 2D Echocardiogram has been performed.  [name] 12/23/2014, 3:23 PM

## 2014-12-23 NOTE — ED Notes (Signed)
16:28 pt can go to floor,

## 2014-12-24 DIAGNOSIS — R079 Chest pain, unspecified: Secondary | ICD-10-CM

## 2014-12-24 DIAGNOSIS — K210 Gastro-esophageal reflux disease with esophagitis: Secondary | ICD-10-CM

## 2014-12-24 LAB — CBC
HEMATOCRIT: 43.8 % (ref 39.0–52.0)
HEMOGLOBIN: 14.9 g/dL (ref 13.0–17.0)
MCH: 30.8 pg (ref 26.0–34.0)
MCHC: 34 g/dL (ref 30.0–36.0)
MCV: 90.7 fL (ref 78.0–100.0)
Platelets: 293 10*3/uL (ref 150–400)
RBC: 4.83 MIL/uL (ref 4.22–5.81)
RDW: 14.3 % (ref 11.5–15.5)
WBC: 13.2 10*3/uL — ABNORMAL HIGH (ref 4.0–10.5)

## 2014-12-24 LAB — BASIC METABOLIC PANEL
ANION GAP: 11 (ref 5–15)
BUN: 21 mg/dL — ABNORMAL HIGH (ref 6–20)
CALCIUM: 8.9 mg/dL (ref 8.9–10.3)
CHLORIDE: 102 mmol/L (ref 101–111)
CO2: 25 mmol/L (ref 22–32)
Creatinine, Ser: 0.95 mg/dL (ref 0.61–1.24)
GFR calc non Af Amer: >60 mL/min (ref >60)
GLUCOSE: 317 mg/dL — AB (ref 65–99)
POTASSIUM: 4.7 mmol/L (ref 3.5–5.1)
Sodium: 138 mmol/L (ref 135–145)

## 2014-12-24 LAB — TROPONIN I: Troponin I: <0.03 ng/mL (ref <0.031)

## 2014-12-24 LAB — MAGNESIUM: MAGNESIUM: 2.2 mg/dL (ref 1.7–2.4)

## 2014-12-24 MED ORDER — BUDESONIDE 0.25 MG/2ML IN SUSP
0.5000 mg | Freq: Two times a day (BID) | RESPIRATORY_TRACT | Status: DC
  Administered 2014-12-25 – 2014-12-26 (×4): 0.5 mg via RESPIRATORY_TRACT
  Filled 2014-12-24 (×4): qty 4

## 2014-12-24 MED ORDER — METHYLPREDNISOLONE SODIUM SUCC 125 MG IJ SOLR
60.0000 mg | Freq: Every day | INTRAMUSCULAR | Status: DC
  Administered 2014-12-24: 60 mg via INTRAVENOUS
  Filled 2014-12-24 (×2): qty 0.96

## 2014-12-24 MED ORDER — ARFORMOTEROL TARTRATE 15 MCG/2ML IN NEBU
15.0000 ug | INHALATION_SOLUTION | Freq: Two times a day (BID) | RESPIRATORY_TRACT | Status: DC
  Administered 2014-12-25 (×2): 15 ug via RESPIRATORY_TRACT
  Filled 2014-12-24 (×7): qty 2

## 2014-12-24 NOTE — Progress Notes (Signed)
Inpatient Diabetes Program Recommendations  AACE/ADA: New Consensus Statement on Inpatient Glycemic Control (2015)  Target Ranges:  Prepandial:   less than 140 mg/dL      Peak postprandial:   less than 180 mg/dL (1-2 hours)      Critically ill patients:  140 - 180 mg/dL   Results for Truex, GENTRY (MRN [id number]) as of 12/24/2014 07:09  Ref. Range 12/24/2014 01:18  Glucose Latest Ref Range: 65-99 mg/dL 317 (H)    Review of Glycemic Control  Diabetes history: None Current orders for Inpatient glycemic control: none  Inpatient Diabetes Program Recommendations: Steroid hyperglycemia Correction (SSI): Patient receiving IV Solumedrol 60 mg TID, Patient's lab glucose was 317 mg/dl at 0118 am. Please consider ordering CBGs and Novolog Correction AC + HS coverage.   Thanks,  Tama Headings RN, MSN, [facility] Inpatient Diabetes Coordinator Team Pager [phone] (8a-5p)

## 2014-12-24 NOTE — Progress Notes (Signed)
Patient Demographics:    [name], is a 69 y.o. male, DOB - 1945/08/31, [id number]  Admit date - 12/23/2014   Admitting Physician Thurnell Lose, MD  Outpatient Primary MD for the patient is [name], MD  LOS - 1   Chief Complaint  Patient presents with  . Chest Pain  . Shortness of Breath        Subjective:    [name] today has, No headache, No chest pain, No abdominal pain - No Nausea, No new weakness tingling or numbness, No Cough - SOB.     Assessment  & Plan :     1. Chest pain in a patient with CAD and stent placement in 2014. Says his pain characteristic is similar to his MI in the past, will be admitted to a telemetry bed, cycle troponin, place on aspirin and Nitropaste, not on beta blocker due to severe COPD, check echogram, cardiology has been consulted and they are planning for a left heart cath.    2. Advanced COPD with mild exacerbation . Patient is moving minimum air bilaterally but this likely is his baseline, he does not appear to be in any distress, he is not requiring any oxygen at this time, pending sputum Gram stain culture, placed him on Solu-Medrol along with Levaquin. Will start tapering steroids, continue supportive care with nebulizer treatments as needed.   3. Dyslipidemia. Continue home medications.    4. GERD. On PPI continue.   5. Mild leukocytosis. Reactionary due to #2 above. Treatment as in #2 above.    6. Essential hypertension. Home dose ARB and Norvasc continued.   7. Hypokalemia. Replaced and stable.    Code Status : Full  Family Communication  : None present  Disposition Plan  : Home after heart cath  Consults  :  Cardiology  Procedures  :    DVT Prophylaxis  :  Lovenox   Lab Results  Component Value Date   PLT 293  12/24/2014    Inpatient Medications  Scheduled Meds: . albuterol  2.5 mg Nebulization Q6H  . amLODipine  5 mg Oral Daily  . aspirin EC  81 mg Oral Daily  . budesonide-formoterol  2 puff Inhalation BID  . enoxaparin (LOVENOX) injection  40 mg Subcutaneous Q24H  . ezetimibe  10 mg Oral Daily  . levofloxacin  500 mg Oral Daily  . losartan  50 mg Oral Daily  . methylPREDNISolone (SOLU-MEDROL) injection  60 mg Intravenous TID  . nitroGLYCERIN  0.5 inch Topical 3 times per day  . pantoprazole  40 mg Oral Daily  . potassium chloride SA  20 mEq Oral Daily  . sodium chloride  3 mL Intravenous Q12H  . tiotropium  18 mcg Inhalation Daily   Continuous Infusions:  PRN Meds:.acetaminophen, guaiFENesin, guaiFENesin-dextromethorphan, HYDROcodone-acetaminophen, nitroGLYCERIN, ondansetron **OR** ondansetron (ZOFRAN) IV, polyethylene glycol  Antibiotics  :     Anti-infectives    Start     Dose/Rate Route Frequency Ordered Stop   12/23/14 1500  levofloxacin (LEVAQUIN) tablet 500 mg     500 mg Oral Daily 12/23/14 1413          Objective:   Filed Vitals:   12/23/14 1700 12/23/14 2012 12/23/14 2113 12/24/14 0453  BP:  145/72 159/82  Pulse:   93 100  Temp:   97.8 F (36.6 C) 98.1 F (36.7 C)  TempSrc:   Oral Oral  Resp:   18 18  Height: 5\' 4"  (1.626 m)     Weight: 62.89 kg (138 lb 10.4 oz)   62.732 kg (138 lb 4.8 oz)  SpO2:  98% 96% 100%    Wt Readings from Last 3 Encounters:  12/24/14 62.732 kg (138 lb 4.8 oz)  11/22/14 62.596 kg (138 lb)  05/01/14 59.5 kg (131 lb 2.8 oz)     Intake/Output Summary (Last 24 hours) at 12/24/14 0800 Last data filed at 12/23/14 1700  Gross per 24 hour  Intake    200 ml  Output      0 ml  Net    200 ml     Physical Exam  Awake Alert, Oriented X 3, No new F.N deficits, Normal affect [place].AT,PERRAL Supple Neck,No JVD, No cervical lymphadenopathy appriciated.  Symmetrical Chest wall movement, minimal air movement bilaterally, no wheezing RRR,No  Gallops,Rubs or new Murmurs, No Parasternal Heave +ve B.Sounds, Abd Soft, No tenderness, No organomegaly appriciated, No rebound - guarding or rigidity. No Cyanosis, Clubbing or edema, No new Rash or bruise     Data Review:   Micro Results No results found for this or any previous visit (from the past 240 hour(s)).  Radiology Reports Dg Chest Port 1 View  12/23/2014  CLINICAL DATA:  Chest pain and shortness of Breath EXAM: PORTABLE CHEST - 1 VIEW COMPARISON:  11/22/2014 FINDINGS: The heart size and mediastinal contours are within normal limits. Both lungs are clear. The visualized skeletal structures are unremarkable. IMPRESSION: No acute abnormality noted. Electronically Signed   By: Inez Catalina M.D.   On: 12/23/2014 11:30     CBC  Recent Labs Lab 12/23/14 1054 12/24/14 0118  WBC 14.2* 13.2*  HGB 15.4 14.9  HCT 46.3 43.8  PLT 307 293  MCV 91.5 90.7  MCH 30.4 30.8  MCHC 33.3 34.0  RDW 14.5 14.3  LYMPHSABS 2.3  --   MONOABS 0.8  --   EOSABS 0.3  --   BASOSABS 0.0  --     Chemistries   Recent Labs Lab 12/23/14 1054 12/24/14 0118  NA 141 138  K 3.3* 4.7  CL 103 102  CO2 29 25  GLUCOSE 90 317*  BUN 15 21*  CREATININE 0.80 0.95  CALCIUM 8.6* 8.9  MG  --  2.2  AST 19  --   ALT 45  --   ALKPHOS 67  --   BILITOT 0.8  --    ------------------------------------------------------------------------------------------------------------------ estimated creatinine clearance is 61.5 mL/min (by C-G formula based on Cr of 0.95). ------------------------------------------------------------------------------------------------------------------ No results for input(s): HGBA1C in the last 72 hours. ------------------------------------------------------------------------------------------------------------------ No results for input(s): CHOL, HDL, LDLCALC, TRIG, CHOLHDL, LDLDIRECT in the last 72  hours. ------------------------------------------------------------------------------------------------------------------ No results for input(s): TSH, T4TOTAL, T3FREE, THYROIDAB in the last 72 hours.  Invalid input(s): FREET3 ------------------------------------------------------------------------------------------------------------------ No results for input(s): VITAMINB12, FOLATE, FERRITIN, TIBC, IRON, RETICCTPCT in the last 72 hours.  Coagulation profile  Recent Labs Lab 12/23/14 2045  INR 0.99    No results for input(s): DDIMER in the last 72 hours.  Cardiac Enzymes  Recent Labs Lab 12/23/14 1342 12/23/14 2045 12/24/14 0118  TROPONINI <0.03 <0.03 <0.03   ------------------------------------------------------------------------------------------------------------------ Invalid input(s): POCBNP   Time Spent in minutes  35   Eirene Rather K M.D on 12/24/2014 at 8:00 AM  Between 7am to 7pm -  Pager - [phone]  After 7pm go to [email or web address] - password [facility]  [facility] -  Office  [phone]

## 2014-12-24 NOTE — Consult Note (Signed)
Name: [name] MRN: [id number] DOB: 1945/02/19    ADMISSION DATE:  12/23/2014 CONSULTATION DATE:  12/24/14  REFERRING MD :  Dr. Candiss Norse   CHIEF COMPLAINT:  COPD Exacerbation   BRIEF PATIENT DESCRIPTION: 69 y/o M, former smoker, with known COPD (followed by Dr. Lake Bells) who presented to [facility] on 12/15 with complaints of chest pain that was similar to prior chest pain before stents.  He was recently treated for an acute exacerbation of COPD by Dr. Lake Bells approximately one month prior to admit.  Admitted by [facility] for chest pain and COPD exacerbation.  Chest pain improved with nitroglycerin.  PCCM consulted for evaluation.    SIGNIFICANT EVENTS  12/15  Admit with chest pain, COPD exacerbation  STUDIES:     HISTORY OF PRESENT ILLNESS:  69 y/o M, former smoker, with a PMH of HTN, CAD s/p stent, GERD, allergic rhinitis, and severe COPD (followed by Dr. Lake Bells, FEV1 47%, FEV/FVC ratio 50%) who presented to [facility] on 12/15 with complaints of chest pain that was similar to prior chest pain before stents.  He was recently treated for an acute exacerbation of COPD by Dr. Lake Bells approximately one month prior to admit.    The patient reported gradually worsening shortness of breath, approximately 48 hours of productive cough and sharp left sided chest pain.  He described pain as non-radiating, persisted for approximately 24 hours and improved with rest.  Initial labs - Na 141, K 3.3, sr cr 0.80, troponin 0.02, BNP 45.4, WBC 13.2, hgb 14.9, platelets 293 and INR 0.99.  Initial CXR demonstrated no acute process. He was treated with nitroglycerin and chest pain improved.  The patient was admitted per [facility] for further evaluation of chest pain and COPD exacerbation.    ECHO assessed 12/15 notable for normal systolic function, LVEF 0000000, normal wall motion, grade 1 diastolic dysfunction, mild aortic root dilation.  He was treated with symbicort, mucinex, IV steroids and spiriva for COPD exacerbation.  He was  evaluated by Cardiology and felt that cardiac catheterization was indicated.     PCCM consulted for evaluation.     PAST MEDICAL HISTORY :   has a past medical history of COPD (chronic obstructive pulmonary disease) ([place]); GERD (gastroesophageal reflux disease); CAD (coronary artery disease), native coronary artery (6/11, 2/14, 6/14; 7/15); HTN (hypertension); Asthma; Hyperlipidemia; Tobacco abuse; and Allergic rhinitis.  has past surgical history that includes left foot surgery; Coronary angioplasty with stent (02/15/12); Coronary angioplasty with stent (07/01/12); Cardiac catheterization (08/03/13); left heart catheterization with coronary angiogram (N/A, 02/15/2012); percutaneous coronary stent intervention (pci-s) (N/A, 02/15/2012); left heart catheterization with coronary angiogram (N/A, 07/01/2012); percutaneous coronary stent intervention (pci-s) (07/01/2012); and left heart catheterization with coronary angiogram (N/A, 08/03/2013).   Prior to Admission medications   Medication Sig Start Date End Date Taking? Authorizing Provider  acetaminophen (TYLENOL) 500 MG tablet Take 500 mg by mouth every 6 (six) hours as needed for mild pain.   Yes Historical Provider, MD  albuterol (PROVENTIL HFA) 108 (90 BASE) MCG/ACT inhaler Inhale 2 puffs into the lungs 2 (two) times daily. 05/06/14  Yes Robbie Lis, MD  albuterol (PROVENTIL) (2.5 MG/3ML) 0.083% nebulizer solution USE 1 VIAL IN NEBULIZER TWICE A DAY AS NEEDED 05/06/14  Yes Robbie Lis, MD  amLODipine (NORVASC) 5 MG tablet Take 1 tablet (5 mg total) by mouth daily. 07/29/14  Yes Peter M [name], MD  aspirin 81 MG tablet Take 81 mg by mouth daily.    Yes Historical Provider, MD  budesonide-formoterol (SYMBICORT) 160-4.5 MCG/ACT inhaler Inhale 2 puffs into the lungs 2 (two) times daily. 05/06/14  Yes Robbie Lis, MD  guaiFENesin (MUCINEX) 600 MG 12 hr tablet Take 600 mg by mouth 2 (two) times daily as needed for cough or to loosen phlegm.   Yes Historical  Provider, MD  ibuprofen (ADVIL,MOTRIN) 200 MG tablet Take 400 mg by mouth every 6 (six) hours as needed for moderate pain.   Yes Historical Provider, MD  naproxen sodium (ANAPROX) 220 MG tablet Take 220 mg by mouth every 12 (twelve) hours as needed (for pain).   Yes Historical Provider, MD  nitroGLYCERIN (NITROSTAT) 0.4 MG SL tablet Place 1 tablet (0.4 mg total) under the tongue every 5 (five) minutes as needed for chest pain. 06/18/12  Yes Peter M [name], MD  omeprazole (PRILOSEC) 20 MG capsule TAKE 1 CAPSULE (20 MG TOTAL) BY MOUTH 2 (TWO) TIMES DAILY BEFORE A MEAL. 09/10/14  Yes Isaiah Serge, NP  pantoprazole (PROTONIX) 40 MG tablet Take 40 mg by mouth daily.   Yes Historical Provider, MD  potassium chloride SA (KLOR-CON M20) 20 MEQ tablet Take 1 tablet (20 mEq total) by mouth daily. NEEDS TO MAKE APPOINTMENT WITH PRIMARY CARDIOLOGIST FOR FURTHER REFILLS [phone] 09/21/14  Yes Peter M [name], MD  predniSONE (DELTASONE) 10 MG tablet Take 40mg  po daily for 3 days, then take 30mg  po daily for 3 days, then take 20mg  po daily for two days, then take 10mg  po daily for 2 days 11/22/14  Yes Juanito Doom, MD  Spacer/Aero-Holding Chambers (AEROCHAMBER MV) inhaler Use as instructed 11/22/14  Yes Juanito Doom, MD  tiotropium (SPIRIVA HANDIHALER) 18 MCG inhalation capsule Place 1 capsule (18 mcg total) into inhaler and inhale daily. 05/06/14  Yes Robbie Lis, MD  ezetimibe (ZETIA) 10 MG tablet Take 1 tablet (10 mg total) by mouth daily. Patient not taking: Reported on 12/23/2014 07/07/13   Peter M [name], MD  losartan (COZAAR) 50 MG tablet Take 1 tablet (50 mg total) by mouth daily. Patient not taking: Reported on 12/23/2014 09/18/13   Elsie Stain, MD   No Known Allergies  FAMILY HISTORY:  family history is not on file.   SOCIAL HISTORY:  reports that he quit smoking about 6 years ago. His smoking use included Cigarettes. He has a 50 pack-year smoking history. He has never used smokeless  tobacco. He reports that he does not drink alcohol or use illicit drugs.  REVIEW OF SYSTEMS:   Constitutional: Negative for fever, chills, weight loss, malaise/fatigue and diaphoresis.  HENT: Negative for hearing loss, ear pain, nosebleeds, congestion, sore throat, neck pain, tinnitus and ear discharge.   Eyes: Negative for blurred vision, double vision, photophobia, pain, discharge and redness.  Respiratory: Negative for hemoptysis, wheezing and stridor.  Positive for cough, sputum production, worsening SOB Cardiovascular: Negative for palpitations, orthopnea, claudication, leg swelling and PND.  Positive for chest pain Gastrointestinal: Negative for heartburn, nausea, vomiting, abdominal pain, diarrhea, constipation, blood in stool and melena.  Genitourinary: Negative for dysuria, urgency, frequency, hematuria and flank pain.  Musculoskeletal: Negative for myalgias, back pain, joint pain and falls.  Skin: Negative for itching and rash.  Neurological: Negative for dizziness, tingling, tremors, sensory change, speech change, focal weakness, seizures, loss of consciousness, weakness and headaches.  Endo/Heme/Allergies: Negative for environmental allergies and polydipsia. Does not bruise/bleed easily.  SUBJECTIVE:   VITAL SIGNS: Temp:  [97.7 F (36.5 C)-98.1 F (36.7 C)] 98.1 F (36.7 C) (12/16 0453) Pulse  Rate:  [71-100] 100 (12/16 0453) Resp:  [17-31] 18 (12/16 0453) BP: (123-164)/(72-151) 159/82 mmHg (12/16 0453) SpO2:  [94 %-100 %] 96 % (12/16 0859) Weight:  [138 lb 4.8 oz (62.732 kg)-138 lb 10.4 oz (62.89 kg)] 138 lb 4.8 oz (62.732 kg) (12/16 0453)  PHYSICAL EXAMINATION: General:  Awake, no distress Neuro:  Intact, no focal deficits HEENT:  Moist mucus membranes Cardiovascular:  RRR, No MRG Lungs:  Distant breath sound, No wheeze or crackles Abdomen:  Soft, + BS Skin:  Intact   Recent Labs Lab 12/23/14 1054 12/24/14 0118  NA 141 138  K 3.3* 4.7  CL 103 102  CO2 29 25    BUN 15 21*  CREATININE 0.80 0.95  GLUCOSE 90 317*    Recent Labs Lab 12/23/14 1054 12/24/14 0118  HGB 15.4 14.9  HCT 46.3 43.8  WBC 14.2* 13.2*  PLT 307 293   Dg Chest Port 1 View  12/23/2014  CLINICAL DATA:  Chest pain and shortness of Breath EXAM: PORTABLE CHEST - 1 VIEW COMPARISON:  11/22/2014 FINDINGS: The heart size and mediastinal contours are within normal limits. Both lungs are clear. The visualized skeletal structures are unremarkable. IMPRESSION: No acute abnormality noted. Electronically Signed   By: Inez Catalina M.D.   On: 12/23/2014 11:30    ASSESSMENT / PLAN:   Acute Exacerbation of COPD - nearing baseline Baseline Severe COPD Former Smoker   Plan: Continue IV steroids, gradual wean to off over 7-10 days Change symbicort to brovana / pulmicort while inpatient.  Back to Symbicort at discharge  Pulmonary hygiene:  IS, mobilize Oxygen as needed to support saturations 88-94%.   Will need ambulatory O2 assessment prior to discharge Intermittent CXR  PPI  Levaquin D2/5 PRN albuterol  Mucinex / Robitussin as ordered   Chest Pain - troponin negative, no RWMA on ECHO, concern COPD may be driving chest pain.   Hypertension - [id number] systolic   Plan: Pending LHC evaluation   ASA, cozaar, & PRN NTG BP control per Cardiology / Primary SVC.  Marshell Garfinkel MD [facility] Pager [phone] If no answer or after 3pm call: [phone] 12/24/2014, 5:39 PM

## 2014-12-24 NOTE — Progress Notes (Signed)
Patient Name: [name] Date of Encounter: 12/24/2014  Principal Problem:   Chest pain, negative MI, secondary due to COPD exacerbation  Active Problems:   COPD with exacerbation ([place])   GERD   Essential hypertension   Tobacco abuse, has stopped   CAD (coronary artery disease), native coronary artery   Uncontrolled diabetes mellitus without complication [facility])   Primary Cardiologist: [name]  Patient Profile: 69 y.o. 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  SUBJECTIVE:  Pt says breathing no better because he is coughing, started at 1 am. Makes him tired and affects his whole body. The coughing is very bad. The coughing causes burning pain across his chest. Not like his heart pain.   OBJECTIVE Filed Vitals:   12/23/14 1700 12/23/14 2012 12/23/14 2113 12/24/14 0453  BP:   145/72 159/82  Pulse:   93 100  Temp:   97.8 F (36.6 C) 98.1 F (36.7 C)  TempSrc:   Oral Oral  Resp:   18 18  Height: 5\' 4"  (1.626 m)     Weight: 138 lb 10.4 oz (62.89 kg)   138 lb 4.8 oz (62.732 kg)  SpO2:  98% 96% 100%    Intake/Output Summary (Last 24 hours) at 12/24/14 0750 Last data filed at 12/23/14 1700  Gross per 24 hour  Intake    200 ml  Output      0 ml  Net    200 ml   Filed Weights   12/23/14 1700 12/24/14 0453  Weight: 138 lb 10.4 oz (62.89 kg) 138 lb 4.8 oz (62.732 kg)    PHYSICAL EXAM General: Well developed, well nourished, male in no acute distress. Head: Normocephalic, atraumatic.  Neck: Supple without bruits, JVD not elevated. Lungs:  Resp regular and unlabored, decreased BS bases w/ wheeze. Heart: RRR, S1, S2, no S3, S4, or murmur; no rub. Abdomen: Soft, non-tender, non-distended, BS + x 4.  Extremities: No clubbing, cyanosis, edema.  Neuro: Alert and oriented X 3. Moves all extremities  spontaneously. Psych: Normal affect.  LABS: CBC: Recent Labs  12/23/14 1054 12/24/14 0118  WBC 14.2* 13.2*  NEUTROABS 10.8*  --   HGB 15.4 14.9  HCT 46.3 43.8  MCV 91.5 90.7  PLT 307 293   INR: Recent Labs  12/23/14 2045  INR [id number]   Basic Metabolic Panel: Recent Labs  12/23/14 1054 12/24/14 0118  NA 141 138  K 3.3* 4.7  CL 103 102  CO2 29 25  GLUCOSE 90 317*  BUN 15 21*  CREATININE 0.80 0.95  CALCIUM 8.6* 8.9  MG  --  2.2   Liver Function Tests: Recent Labs  12/23/14 1054  AST 19  ALT 45  ALKPHOS 67  BILITOT 0.8  PROT 6.5  ALBUMIN 3.7   Cardiac Enzymes: Recent Labs  12/23/14 1342 12/23/14 2045 12/24/14 0118  TROPONINI <0.03 <0.03 <0.03    Recent Labs  12/23/14 1100  TROPIPOC 0.02   BNP:  B NATRIURETIC PEPTIDE  Date/Time Value Ref Range Status  12/23/2014 10:54 AM 45.4 0.0 - 100.0 pg/mL Final   TELE:  SR, ST      Radiology/Studies: Dg Chest Port 1 View  12/23/2014  CLINICAL DATA:  Chest pain and shortness of Breath EXAM: PORTABLE CHEST - 1 VIEW COMPARISON:  11/22/2014 FINDINGS: The heart size and mediastinal contours are within normal limits. Both lungs are clear. The visualized skeletal structures are unremarkable. IMPRESSION: No acute abnormality noted. Electronically Signed   By: Inez Catalina M.D.   On: 12/23/2014 11:30     Current Medications:  . albuterol  2.5 mg Nebulization Q6H  . amLODipine  5 mg Oral Daily  . aspirin EC  81 mg Oral Daily  . budesonide-formoterol  2 puff Inhalation BID  . enoxaparin (LOVENOX) injection  40 mg Subcutaneous Q24H  . ezetimibe  10 mg Oral Daily  . levofloxacin  500 mg Oral Daily  . losartan  50 mg Oral Daily  . methylPREDNISolone (SOLU-MEDROL) injection  60 mg Intravenous TID  . nitroGLYCERIN  0.5 inch Topical 3 times per day  . pantoprazole  40 mg Oral Daily  . potassium chloride SA  20 mEq Oral Daily  . sodium chloride  3 mL Intravenous Q12H  . tiotropium  18 mcg Inhalation Daily       ASSESSMENT AND PLAN: Principal Problem:  Chest pain, negative MI, secondary due to COPD exacerbation  - Cardiac enzymes are negative and ECG is unchanged. - He has a history of multiple cardiac stents and his symptoms improved with nitroglycerin -On aspirin, nitrates, Zetia and Cozaar - No beta blocker as this has worsened his wheezing in the past  - EF previously normal, repeat echo pending  - Chest pain was triggered by his COPD exacerbation but it is at least moderately likely to be angina.  - Do not feel stress testing is appropriate in this patient as he has significant respiratory difficulties and cardiac history.  - Cardiac catheterization is indicated. Not sure his respiratory status is improved enough to allow the procedure to be performed today, discuss w/ MD and pt by interpreter, if not today, do Monday.   Otherwise, per IM, pt does not seem to believe he has improved very much.   Active Problems:  GERD  Essential hypertension  Tobacco abuse, has stopped  CAD (coronary artery disease), native coronary artery  Uncontrolled diabetes mellitus without complication ([place])  COPD with exacerbation   Signed, Lenoard Aden 7:50 AM 12/24/2014  Patient examined chart reviewed. Much less tachypnic and much less wheezing than yesterday R/O  Agree with heart cath today.  Risks discussed via phone interpreter.  Good right radial pulse  Jenkins Rouge

## 2014-12-24 NOTE — Progress Notes (Signed)
Cath initially planned for Monday, but pt improved and tried to schedule for today. However, unable to get [name] interpreter. Cath not emergent so have to have interpreter for procedure.   Interpreter scheduled for Monday.  Pt aware.   Rosaria Ferries, PA-C 12/24/2014 3:20 PM Beeper [phone]

## 2014-12-25 DIAGNOSIS — I25118 Atherosclerotic heart disease of native coronary artery with other forms of angina pectoris: Secondary | ICD-10-CM

## 2014-12-25 DIAGNOSIS — I493 Ventricular premature depolarization: Secondary | ICD-10-CM | POA: Insufficient documentation

## 2014-12-25 DIAGNOSIS — J441 Chronic obstructive pulmonary disease with (acute) exacerbation: Principal | ICD-10-CM

## 2014-12-25 LAB — BASIC METABOLIC PANEL
ANION GAP: 10 (ref 5–15)
BUN: 31 mg/dL — ABNORMAL HIGH (ref 6–20)
CALCIUM: 8.6 mg/dL — AB (ref 8.9–10.3)
CO2: 24 mmol/L (ref 22–32)
CREATININE: 1.06 mg/dL (ref 0.61–1.24)
Chloride: 105 mmol/L (ref 101–111)
GLUCOSE: 338 mg/dL — AB (ref 65–99)
Potassium: 4.2 mmol/L (ref 3.5–5.1)
Sodium: 139 mmol/L (ref 135–145)

## 2014-12-25 MED ORDER — HYDROCODONE-ACETAMINOPHEN 5-325 MG PO TABS: 1.0000 | ORAL_TABLET | ORAL | Status: DC | PRN

## 2014-12-25 MED ORDER — PREDNISONE 50 MG PO TABS
50.0000 mg | ORAL_TABLET | Freq: Every day | ORAL | Status: DC
  Administered 2014-12-25 – 2014-12-26 (×2): 50 mg via ORAL
  Filled 2014-12-25 (×2): qty 1

## 2014-12-25 NOTE — Progress Notes (Signed)
Patient Demographics:    [name], is a 69 y.o. male, DOB - [birth year], [id number]  Admit date - 12/23/2014   Admitting Physician Thurnell Lose, MD  Outpatient Primary MD for the patient is [name], MD  LOS - 2   Chief Complaint  Patient presents with  . Chest Pain  . Shortness of Breath        Subjective:    [name] today has, No headache, No chest pain, No abdominal pain - No Nausea, No new weakness tingling or numbness, No Cough - SOB.     Assessment  & Plan :     1. Chest pain in a patient with CAD and stent placement in 2014. Says his pain characteristic is similar to his MI in the past, will be admitted to a telemetry bed, cycle troponin, place on aspirin and Nitropaste, not on beta blocker due to severe COPD, TTE stable with EF 60% with stable wall motion, cardiology has been consulted and they are planning for a left heart cath on Monday.    2. Advanced COPD with mild exacerbation . Patient is moving minimum air bilaterally but this likely is his baseline, he does not appear to be in any distress, he is not requiring any oxygen at this time, pending sputum Gram stain culture, from Solu-Medrol to prednisone and continue oral Levaquin. Continue supportive care with nebulizer treatments as needed.   3. Dyslipidemia. Continue home medications.    4. GERD. On PPI continue.   5. Mild leukocytosis. Reactionary due to #2 above. Treatment as in #2 above.    6. Essential hypertension. Home dose ARB and Norvasc continued.   7. Hypokalemia. Replaced and stable.    Code Status : Full  Family Communication  : None present  Disposition Plan  : Home after heart cath  Consults  :  Cardiology  Procedures  :    TTE  - Left ventricle: The cavity size was normal.  There was mildconcentric hypertrophy. Systolic function was normal. The estimated ejection fraction was in the range of 55% to 60%. Wallmotion was normal; there were no regional wall motionabnormalities. Doppler parameters are consistent with abnormal left ventricular relaxation (grade 1 diastolic dysfunction). - Aortic root: The aortic root was mildly dilated.  DVT Prophylaxis  :  Lovenox   Lab Results  Component Value Date   PLT 293 12/24/2014    Inpatient Medications  Scheduled Meds: . amLODipine  5 mg Oral Daily  . arformoterol  15 mcg Nebulization BID  . aspirin EC  81 mg Oral Daily  . budesonide  0.5 mg Nebulization BID  . enoxaparin (LOVENOX) injection  40 mg Subcutaneous Q24H  . ezetimibe  10 mg Oral Daily  . levofloxacin  500 mg Oral Daily  . losartan  50 mg Oral Daily  . nitroGLYCERIN  0.5 inch Topical 3 times per day  . pantoprazole  40 mg Oral Daily  . potassium chloride SA  20 mEq Oral Daily  . predniSONE  50 mg Oral Q breakfast  . sodium chloride  3 mL Intravenous Q12H  . tiotropium  18 mcg Inhalation Daily   Continuous Infusions:  PRN Meds:.acetaminophen, guaiFENesin, guaiFENesin-dextromethorphan, HYDROcodone-acetaminophen, nitroGLYCERIN, [DISCONTINUED] ondansetron **OR** ondansetron (ZOFRAN) IV, polyethylene glycol  Antibiotics  :     Anti-infectives    Start     Dose/Rate Route Frequency Ordered Stop   12/23/14 1500  levofloxacin (LEVAQUIN) tablet 500 mg     500 mg Oral Daily 12/23/14 1413          Objective:   Filed Vitals:   12/24/14 1413 12/24/14 1943 12/24/14 2053 12/25/14 0541  BP:   148/64 141/70  Pulse:   106 95  Temp:   98.4 F (36.9 C) 97.7 F (36.5 C)  TempSrc:   Oral Oral  Resp:   18 18  Height:      Weight:    62.46 kg (137 lb 11.2 oz)  SpO2: 94% 97% 96% 97%    Wt Readings from Last 3 Encounters:  12/25/14 62.46 kg (137 lb 11.2 oz)  11/22/14 62.596 kg (138 lb)  05/01/14 59.5 kg (131 lb 2.8 oz)     Intake/Output Summary  (Last 24 hours) at 12/25/14 0835 Last data filed at 12/24/14 1700  Gross per 24 hour  Intake    120 ml  Output      0 ml  Net    120 ml     Physical Exam  Awake Alert, Oriented X 3, No new F.N deficits, Normal affect [place].AT,PERRAL Supple Neck,No JVD, No cervical lymphadenopathy appriciated.  Symmetrical Chest wall movement, minimal air movement bilaterally, no wheezing RRR,No Gallops,Rubs or new Murmurs, No Parasternal Heave +ve B.Sounds, Abd Soft, No tenderness, No organomegaly appriciated, No rebound - guarding or rigidity. No Cyanosis, Clubbing or edema, No new Rash or bruise     Data Review:   Micro Results No results found for this or any previous visit (from the past 240 hour(s)).  Radiology Reports Dg Chest Port 1 View  12/23/2014  CLINICAL DATA:  Chest pain and shortness of Breath EXAM: PORTABLE CHEST - 1 VIEW COMPARISON:  11/22/2014 FINDINGS: The heart size and mediastinal contours are within normal limits. Both lungs are clear. The visualized skeletal structures are unremarkable. IMPRESSION: No acute abnormality noted. Electronically Signed   By: Inez Catalina M.D.   On: 12/23/2014 11:30     CBC  Recent Labs Lab 12/23/14 1054 12/24/14 0118  WBC 14.2* 13.2*  HGB 15.4 14.9  HCT 46.3 43.8  PLT 307 293  MCV 91.5 90.7  MCH 30.4 30.8  MCHC 33.3 34.0  RDW 14.5 14.3  LYMPHSABS 2.3  --   MONOABS 0.8  --   EOSABS 0.3  --   BASOSABS 0.0  --     Chemistries   Recent Labs Lab 12/23/14 1054 12/24/14 0118  NA 141 138  K 3.3* 4.7  CL 103 102  CO2 29 25  GLUCOSE 90 317*  BUN 15 21*  CREATININE 0.80 0.95  CALCIUM 8.6* 8.9  MG  --  2.2  AST 19  --   ALT 45  --   ALKPHOS 67  --   BILITOT 0.8  --    ------------------------------------------------------------------------------------------------------------------ estimated creatinine clearance is 61.5 mL/min (by C-G formula based on Cr of  0.95). ------------------------------------------------------------------------------------------------------------------ No results for input(s): HGBA1C in the last 72 hours. ------------------------------------------------------------------------------------------------------------------ No results for input(s): CHOL, HDL, LDLCALC, TRIG, CHOLHDL, LDLDIRECT in the last 72 hours. ------------------------------------------------------------------------------------------------------------------ No results for input(s): TSH, T4TOTAL, T3FREE, THYROIDAB in the last 72 hours.  Invalid input(s): FREET3 ------------------------------------------------------------------------------------------------------------------ No results for input(s): VITAMINB12, FOLATE, FERRITIN, TIBC, IRON, RETICCTPCT in the last 72 hours.  Coagulation profile  Recent Labs Lab 12/23/14 2045  INR 0.99  No results for input(s): DDIMER in the last 72 hours.  Cardiac Enzymes  Recent Labs Lab 12/23/14 1342 12/23/14 2045 12/24/14 0118  TROPONINI <0.03 <0.03 <0.03   ------------------------------------------------------------------------------------------------------------------ Invalid input(s): POCBNP   Time Spent in minutes  35   Shanesha Bednarz K M.D on 12/25/2014 at 8:35 AM  Between 7am to 7pm - Pager - [phone]  After 7pm go to [email or web address] - password [facility]  [facility] -  Office  [phone]

## 2014-12-25 NOTE — Progress Notes (Signed)
SUBJECTIVE: Denies chest pain. Frustrated about having to stay over weekend, but agreeable to doing so.     Intake/Output Summary (Last 24 hours) at 12/25/14 [id number] Last data filed at 12/24/14 1700  Gross per 24 hour  Intake    120 ml  Output      0 ml  Net    120 ml    Current Facility-Administered Medications  Medication Dose Route Frequency Provider Last Rate Last Dose  . acetaminophen (TYLENOL) tablet 500 mg  500 mg Oral Q6H PRN Thurnell Lose, MD      . amLODipine (NORVASC) tablet 5 mg  5 mg Oral Daily Thurnell Lose, MD   5 mg at 12/24/14 0847  . arformoterol (BROVANA) nebulizer solution 15 mcg  15 mcg Nebulization BID Donita Brooks, NP   15 mcg at 12/25/14 0850  . aspirin EC tablet 81 mg  81 mg Oral Daily Thurnell Lose, MD   81 mg at 12/24/14 1001  . budesonide (PULMICORT) nebulizer solution 0.5 mg  0.5 mg Nebulization BID Donita Brooks, NP   0.5 mg at 12/25/14 0853  . enoxaparin (LOVENOX) injection 40 mg  40 mg Subcutaneous Q24H Thurnell Lose, MD   40 mg at 12/24/14 2340  . ezetimibe (ZETIA) tablet 10 mg  10 mg Oral Daily Thurnell Lose, MD   10 mg at 12/24/14 0846  . guaiFENesin (MUCINEX) 12 hr tablet 600 mg  600 mg Oral BID PRN Thurnell Lose, MD      . guaiFENesin-dextromethorphan (ROBITUSSIN DM) 100-10 MG/5ML syrup 5 mL  5 mL Oral Q4H PRN Thurnell Lose, MD   5 mL at 12/24/14 1738  . HYDROcodone-acetaminophen (NORCO/VICODIN) 5-325 MG per tablet 1 tablet  1 tablet Oral Q4H PRN Thurnell Lose, MD      . levofloxacin [facility]) tablet 500 mg  500 mg Oral Daily Dara Hoyer, RPH   500 mg at 12/24/14 1738  . losartan (COZAAR) tablet 50 mg  50 mg Oral Daily Thurnell Lose, MD   50 mg at 12/24/14 0847  . nitroGLYCERIN (NITROGLYN) 2 % ointment 0.5 inch  0.5 inch Topical 3 times per day Thurnell Lose, MD   0.5 inch at 12/25/14 0641  . nitroGLYCERIN (NITROSTAT) SL tablet 0.4 mg  0.4 mg Sublingual Q5 min PRN Thurnell Lose, MD      . ondansetron  [facility]) injection 4 mg  4 mg Intravenous Q6H PRN Thurnell Lose, MD      . pantoprazole (PROTONIX) EC tablet 40 mg  40 mg Oral Daily Thurnell Lose, MD   40 mg at 12/24/14 0845  . polyethylene glycol (MIRALAX / GLYCOLAX) packet 17 g  17 g Oral Daily PRN Thurnell Lose, MD      . potassium chloride SA (K-DUR,KLOR-CON) CR tablet 20 mEq  20 mEq Oral Daily Thurnell Lose, MD   20 mEq at 12/24/14 0846  . predniSONE (DELTASONE) tablet 50 mg  50 mg Oral Q breakfast Thurnell Lose, MD   50 mg at 12/25/14 0841  . sodium chloride 0.9 % injection 3 mL  3 mL Intravenous Q12H Thurnell Lose, MD   3 mL at 12/24/14 0850  . tiotropium (SPIRIVA) inhalation capsule 18 mcg  18 mcg Inhalation Daily Thurnell Lose, MD   18 mcg at 12/25/14 0900    Filed Vitals:   12/25/14 [id number] 12/25/14 [id number] 12/25/14 0901 12/25/14 [id number]  BP: 141/70     Pulse: 95     Temp: 97.7 F (36.5 C)     TempSrc: Oral     Resp: 18     Height:      Weight: 137 lb 11.2 oz (62.46 kg)     SpO2: 97% 96% 96% 97%    PHYSICAL EXAM General: NAD HEENT: Normal. Neck: No JVD, no thyromegaly.  Lungs: Clear to auscultation bilaterally with normal respiratory effort. CV: Nondisplaced PMI.  Regular rate and rhythm, normal S1/S2, no S3/S4, no murmur.  No pretibial edema.  No carotid bruit.  Normal pedal pulses.  Abdomen: Soft, nontender, no hepatosplenomegaly, no distention.  Neurologic: Alert and oriented x 3.  Psych: Normal affect. Musculoskeletal: Normal range of motion. No gross deformities. Extremities: No clubbing or cyanosis.   TELEMETRY: Reviewed telemetry pt in sinus rhythm/sinus tachycardia with PVC's.  LABS: Basic Metabolic Panel:  Recent Labs  12/23/14 1054 12/24/14 0118  NA 141 138  K 3.3* 4.7  CL 103 102  CO2 29 25  GLUCOSE 90 317*  BUN 15 21*  CREATININE 0.80 0.95  CALCIUM 8.6* 8.9  MG  --  2.2   Liver Function Tests:  Recent Labs  12/23/14 1054  AST 19  ALT 45  ALKPHOS 67  BILITOT 0.8  PROT  6.5  ALBUMIN 3.7    Recent Labs  12/23/14 1054  LIPASE 22   CBC:  Recent Labs  12/23/14 1054 12/24/14 0118  WBC 14.2* 13.2*  NEUTROABS 10.8*  --   HGB 15.4 14.9  HCT 46.3 43.8  MCV 91.5 90.7  PLT 307 293   Cardiac Enzymes:  Recent Labs  12/23/14 1342 12/23/14 2045 12/24/14 0118  TROPONINI <0.03 <0.03 <0.03   BNP: Invalid input(s): POCBNP D-Dimer: No results for input(s): DDIMER in the last 72 hours. Hemoglobin A1C: No results for input(s): HGBA1C in the last 72 hours. Fasting Lipid Panel: No results for input(s): CHOL, HDL, LDLCALC, TRIG, CHOLHDL, LDLDIRECT in the last 72 hours. Thyroid Function Tests: No results for input(s): TSH, T4TOTAL, T3FREE, THYROIDAB in the last 72 hours.  Invalid input(s): FREET3 Anemia Panel: No results for input(s): VITAMINB12, FOLATE, FERRITIN, TIBC, IRON, RETICCTPCT in the last 72 hours.  RADIOLOGY: Dg Chest Port 1 View  12/23/2014  CLINICAL DATA:  Chest pain and shortness of Breath EXAM: PORTABLE CHEST - 1 VIEW COMPARISON:  11/22/2014 FINDINGS: The heart size and mediastinal contours are within normal limits. Both lungs are clear. The visualized skeletal structures are unremarkable. IMPRESSION: No acute abnormality noted. Electronically Signed   By: Inez Catalina M.D.   On: 12/23/2014 11:30      ASSESSMENT AND PLAN: 1. Chest pain in context of CAD with prior PCI: Cath planned for Monday. No symptom recurrence. Tachycardic at times with PVC's. Will check BMET as he was hypokalemic two days ago. If tachycardia is persistent or chest pain recurs over the weekend, consider use of diltiazem, as beta blockers would only worsen bronchospastic disease. Continue ASA, amlodipine, nitro paste, Zetia, and losartan.  2. Essential HTN: Reasonable BP at present. Will monitor. Continue amlodipine and losartan.  3. Hypokalemia: Check BMET given PVC's.   Kate Sable, M.D., F.A.C.C.

## 2014-12-25 NOTE — Consult Note (Signed)
Name: [name] MRN: [id number] DOB: August 11, 1945    ADMISSION DATE:  12/23/2014 CONSULTATION DATE:  12/24/14  REFERRING MD :  Dr. Candiss Norse   CHIEF COMPLAINT:  COPD Exacerbation   BRIEF PATIENT DESCRIPTION: 69 y/o M, former smoker, with known COPD (followed by Dr. Lake Bells) who presented to [facility] on 12/15 with complaints of chest pain that was similar to prior chest pain before stents.  He was recently treated for an acute exacerbation of COPD by Dr. Lake Bells approximately one month prior to admit.  Admitted by [facility] for chest pain and COPD exacerbation.  Chest pain improved with nitroglycerin.  PCCM consulted for evaluation.    SIGNIFICANT EVENTS  12/15  Admit with chest pain, COPD exacerbation  STUDIES:     HISTORY OF PRESENT ILLNESS:  69 y/o M, former smoker, with a PMH of HTN, CAD s/p stent, GERD, allergic rhinitis, and severe COPD (followed by Dr. Lake Bells, FEV1 47%, FEV/FVC ratio 50%) who presented to [facility] on 12/15 with complaints of chest pain that was similar to prior chest pain before stents.  He was recently treated for an acute exacerbation of COPD by Dr. Lake Bells approximately one month prior to admit.    The patient reported gradually worsening shortness of breath, approximately 48 hours of productive cough and sharp left sided chest pain.  He described pain as non-radiating, persisted for approximately 24 hours and improved with rest.  Initial labs - Na 141, K 3.3, sr cr 0.80, troponin 0.02, BNP 45.4, WBC 13.2, hgb 14.9, platelets 293 and INR 0.99.  Initial CXR demonstrated no acute process. He was treated with nitroglycerin and chest pain improved.  The patient was admitted per [facility] for further evaluation of chest pain and COPD exacerbation.    ECHO assessed 12/15 notable for normal systolic function, LVEF [id number], normal wall motion, grade 1 diastolic dysfunction, mild aortic root dilation.  He was treated with symbicort, mucinex, IV steroids and spiriva for COPD exacerbation.  He was  evaluated by Cardiology and felt that cardiac catheterization was indicated.      SUBJECTIVE:  Able to ambulate slowly VITAL SIGNS: Temp:  [97.7 F (36.5 C)-98.4 F (36.9 C)] 97.7 F (36.5 C) (12/17 0541) Pulse Rate:  [95-106] 95 (12/17 0541) Resp:  [18] 18 (12/17 0541) BP: (125-148)/(64-84) 141/70 mmHg (12/17 0541) SpO2:  [94 %-97 %] 97 % (12/17 0903) Weight:  [137 lb 11.2 oz (62.46 kg)] 137 lb 11.2 oz (62.46 kg) (12/17 0541)  PHYSICAL EXAMINATION: General:  Awake, no distress, very sob with ambulation Neuro:  Intact, no focal deficits HEENT:  Moist mucus membranes Cardiovascular:  RRR, No MRG Lungs:  Distant breath sound, No wheeze or crackles,poor air movement Abdomen:  Soft, + BS Skin:  Intact   Recent Labs Lab 12/23/14 1054 12/24/14 0118  NA 141 138  K 3.3* 4.7  CL 103 102  CO2 29 25  BUN 15 21*  CREATININE 0.80 0.95  GLUCOSE 90 317*    Recent Labs Lab 12/23/14 1054 12/24/14 0118  HGB 15.4 14.9  HCT 46.3 43.8  WBC 14.2* 13.2*  PLT 307 293   Dg Chest Port 1 View  12/23/2014  CLINICAL DATA:  Chest pain and shortness of Breath EXAM: PORTABLE CHEST - 1 VIEW COMPARISON:  11/22/2014 FINDINGS: The heart size and mediastinal contours are within normal limits. Both lungs are clear. The visualized skeletal structures are unremarkable. IMPRESSION: No acute abnormality noted. Electronically Signed   By: Inez Catalina M.D.   On: 12/23/2014 11:30  ASSESSMENT / PLAN:   Acute Exacerbation of COPD - nearing baseline Baseline Severe COPD Former Smoker   Plan: Continue IV steroids, gradual wean to off over 7-10 days Change symbicort to brovana / pulmicort while inpatient.  Back to Symbicort at discharge  Pulmonary hygiene:  IS, mobilize Oxygen as needed to support saturations 88-94%.   Will need ambulatory O2 assessment prior to discharge Intermittent CXR  PPI  Levaquin D3/5 PRN albuterol  Mucinex / Robitussin as ordered   Chest Pain - troponin negative, no  RWMA on ECHO, concern COPD may be driving chest pain.   Hypertension - [id number] systolic   Plan: Pending [facility] evaluation  , he's not happy about being in hospital. PCCM will skip Sunday. ASA, cozaar, & PRN NTG BP control per Cardiology / Primary SVC.  Pulm will f/u Monday post cath   [facility]  [phone]  Cell  [phone]  If no response or cell goes to voicemail, call beeper [phone]   12/25/2014, 9:33 AM

## 2014-12-26 DIAGNOSIS — I25111 Atherosclerotic heart disease of native coronary artery with angina pectoris with documented spasm: Secondary | ICD-10-CM

## 2014-12-26 DIAGNOSIS — E876 Hypokalemia: Secondary | ICD-10-CM

## 2014-12-26 DIAGNOSIS — I493 Ventricular premature depolarization: Secondary | ICD-10-CM

## 2014-12-26 LAB — EXPECTORATED SPUTUM ASSESSMENT W GRAM STAIN, RFLX TO RESP C

## 2014-12-26 LAB — GLUCOSE, CAPILLARY
GLUCOSE-CAPILLARY: 112 mg/dL — AB (ref 65–99)
Glucose-Capillary: 220 mg/dL — ABNORMAL HIGH (ref 65–99)
Glucose-Capillary: 210 mg/dL — ABNORMAL HIGH (ref 65–99)
Glucose-Capillary: 129 mg/dL — ABNORMAL HIGH (ref 65–99)

## 2014-12-26 MED ORDER — INSULIN ASPART 100 UNIT/ML ~~LOC~~ SOLN: 0.0000 [IU] | Freq: Every day | SUBCUTANEOUS | Status: DC

## 2014-12-26 MED ORDER — BUDESONIDE 0.25 MG/2ML IN SUSP
RESPIRATORY_TRACT | Status: AC
  Filled 2014-12-26: qty 2

## 2014-12-26 MED ORDER — INSULIN ASPART 100 UNIT/ML ~~LOC~~ SOLN
0.0000 [IU] | Freq: Three times a day (TID) | SUBCUTANEOUS | Status: DC
  Administered 2014-12-26 (×2): 3 [IU] via SUBCUTANEOUS
  Administered 2014-12-27: 5 [IU] via SUBCUTANEOUS

## 2014-12-26 MED ORDER — PREDNISONE 20 MG PO TABS
40.0000 mg | ORAL_TABLET | Freq: Every day | ORAL | Status: DC
  Filled 2014-12-26: qty 2

## 2014-12-26 NOTE — Progress Notes (Signed)
SUBJECTIVE: No chest pain. Has been coughing. Says BP goes up when he coughs due to medication Garlon Hatchet).     Intake/Output Summary (Last 24 hours) at 12/26/14 0857 Last data filed at 12/25/14 1849  Gross per 24 hour  Intake    840 ml  Output      0 ml  Net    840 ml    Current Facility-Administered Medications  Medication Dose Route Frequency Provider Last Rate Last Dose  . acetaminophen (TYLENOL) tablet 500 mg  500 mg Oral Q6H PRN Thurnell Lose, MD      . amLODipine (NORVASC) tablet 5 mg  5 mg Oral Daily Thurnell Lose, MD   5 mg at 12/25/14 1021  . arformoterol (BROVANA) nebulizer solution 15 mcg  15 mcg Nebulization BID Donita Brooks, NP   15 mcg at 12/25/14 1959  . aspirin EC tablet 81 mg  81 mg Oral Daily Thurnell Lose, MD   81 mg at 12/25/14 1021  . budesonide (PULMICORT) nebulizer solution 0.5 mg  0.5 mg Nebulization BID Donita Brooks, NP   0.5 mg at 12/26/14 0755  . enoxaparin (LOVENOX) injection 40 mg  40 mg Subcutaneous Q24H Thurnell Lose, MD   40 mg at 12/25/14 2219  . ezetimibe (ZETIA) tablet 10 mg  10 mg Oral Daily Thurnell Lose, MD   10 mg at 12/25/14 1021  . guaiFENesin (MUCINEX) 12 hr tablet 600 mg  600 mg Oral BID PRN Thurnell Lose, MD   600 mg at 12/25/14 1604  . guaiFENesin-dextromethorphan (ROBITUSSIN DM) 100-10 MG/5ML syrup 5 mL  5 mL Oral Q4H PRN Thurnell Lose, MD   5 mL at 12/26/14 0811  . HYDROcodone-acetaminophen (NORCO/VICODIN) 5-325 MG per tablet 1 tablet  1 tablet Oral Q4H PRN Thurnell Lose, MD      . insulin aspart (novoLOG) injection 0-5 Units  0-5 Units Subcutaneous QHS Thurnell Lose, MD      . insulin aspart (novoLOG) injection 0-9 Units  0-9 Units Subcutaneous TID WC Thurnell Lose, MD   0 Units at 12/26/14 0805  . levofloxacin (LEVAQUIN) tablet 500 mg  500 mg Oral Daily Dara Hoyer, RPH   500 mg at 12/25/14 1604  . losartan (COZAAR) tablet 50 mg  50 mg Oral Daily Thurnell Lose, MD   50 mg at 12/25/14 1021    . nitroGLYCERIN (NITROGLYN) 2 % ointment 0.5 inch  0.5 inch Topical 3 times per day Thurnell Lose, MD   0.5 inch at 12/26/14 0600  . nitroGLYCERIN (NITROSTAT) SL tablet 0.4 mg  0.4 mg Sublingual Q5 min PRN Thurnell Lose, MD      . ondansetron [facility]) injection 4 mg  4 mg Intravenous Q6H PRN Thurnell Lose, MD      . pantoprazole (PROTONIX) EC tablet 40 mg  40 mg Oral Daily Thurnell Lose, MD   40 mg at 12/25/14 1021  . polyethylene glycol (MIRALAX / GLYCOLAX) packet 17 g  17 g Oral Daily PRN Thurnell Lose, MD      . potassium chloride SA (K-DUR,KLOR-CON) CR tablet 20 mEq  20 mEq Oral Daily Thurnell Lose, MD   20 mEq at 12/25/14 1023  . [START ON 12/27/2014] predniSONE (DELTASONE) tablet 40 mg  40 mg Oral Q breakfast Thurnell Lose, MD      . sodium chloride 0.9 % injection 3 mL  3 mL Intravenous  [place], MD   3 mL at 12/25/14 1025  . tiotropium (SPIRIVA) inhalation capsule 18 mcg  18 mcg Inhalation Daily Thurnell Lose, MD   18 mcg at 12/26/14 0755    Filed Vitals:   12/25/14 2002 12/25/14 2020 12/26/14 0453 12/26/14 0757  BP:  132/76 155/87   Pulse:  89 82   Temp:  98.6 F (37 C) 97.5 F (36.4 C)   TempSrc:  Oral Oral   Resp:  20 20   Height:      Weight:   136 lb 4.8 oz (61.825 kg)   SpO2: 98% 98% 97% 98%    PHYSICAL EXAM General: NAD HEENT: Normal. Neck: No JVD, no thyromegaly.  Lungs: Clear to auscultation bilaterally with normal respiratory effort. CV: Nondisplaced PMI. Regular rate and rhythm, normal S1/S2, no S3/S4, no murmur. No pretibial edema. No carotid bruit. Normal pedal pulses.  Abdomen: Soft, nontender, no hepatosplenomegaly, no distention.  Neurologic: Alert and oriented x 3.  Psych: Normal affect. Musculoskeletal: Normal range of motion. No gross deformities. Extremities: No clubbing or cyanosis.   TELEMETRY: Reviewed telemetry pt in sinus rhythm/sinus tachycardia with PVC's. LABS: Basic Metabolic Panel:  Recent  Labs  12/24/14 0118 12/25/14 1015  NA 138 139  K 4.7 4.2  CL 102 105  CO2 25 24  GLUCOSE 317* 338*  BUN 21* 31*  CREATININE 0.95 1.06  CALCIUM 8.9 8.6*  MG 2.2  --    Liver Function Tests:  Recent Labs  12/23/14 1054  AST 19  ALT 45  ALKPHOS 67  BILITOT 0.8  PROT 6.5  ALBUMIN 3.7    Recent Labs  12/23/14 1054  LIPASE 22   CBC:  Recent Labs  12/23/14 1054 12/24/14 0118  WBC 14.2* 13.2*  NEUTROABS 10.8*  --   HGB 15.4 14.9  HCT 46.3 43.8  MCV 91.5 90.7  PLT 307 293   Cardiac Enzymes:  Recent Labs  12/23/14 1342 12/23/14 2045 12/24/14 0118  TROPONINI <0.03 <0.03 <0.03   BNP: Invalid input(s): POCBNP D-Dimer: No results for input(s): DDIMER in the last 72 hours. Hemoglobin A1C: No results for input(s): HGBA1C in the last 72 hours. Fasting Lipid Panel: No results for input(s): CHOL, HDL, LDLCALC, TRIG, CHOLHDL, LDLDIRECT in the last 72 hours. Thyroid Function Tests: No results for input(s): TSH, T4TOTAL, T3FREE, THYROIDAB in the last 72 hours.  Invalid input(s): FREET3 Anemia Panel: No results for input(s): VITAMINB12, FOLATE, FERRITIN, TIBC, IRON, RETICCTPCT in the last 72 hours.  RADIOLOGY: Dg Chest Port 1 View  12/23/2014  CLINICAL DATA:  Chest pain and shortness of Breath EXAM: PORTABLE CHEST - 1 VIEW COMPARISON:  11/22/2014 FINDINGS: The heart size and mediastinal contours are within normal limits. Both lungs are clear. The visualized skeletal structures are unremarkable. IMPRESSION: No acute abnormality noted. Electronically Signed   By: Inez Catalina M.D.   On: 12/23/2014 11:30      ASSESSMENT AND PLAN: 1. Chest pain in context of CAD with prior PCI: No recurrent symptoms over weekend. Cath planned for Monday. Rhythm is sinus with PVC's. Potassium is normal, hypokalemic three days ago. If chest pain or tachycardia recurs, consider use of diltiazem, as beta blockers would only worsen bronchospastic disease. Continue ASA, amlodipine, nitro  paste, Zetia, and losartan.  2. Essential HTN: Elevated but patient attributes to coughing with Brovana. Will monitor. Continue amlodipine and losartan.  3. Hypokalemia: K normal yesterday. BMET pending today.   Kate Sable, M.D., F.A.C.C.

## 2014-12-26 NOTE — Progress Notes (Addendum)
Patient Demographics:    [name], is a 69 y.o. male, DOB - 12-10-1945, [id number]  Admit date - 12/23/2014   Admitting Physician Thurnell Lose, MD  Outpatient Primary MD for the patient is [name], MD  LOS - 3   Chief Complaint  Patient presents with  . Chest Pain  . Shortness of Breath        Subjective:    [name] today has, No headache, No chest pain, No abdominal pain - No Nausea, No new weakness tingling or numbness, No Cough - SOB.     Assessment  & Plan :     1. Chest pain in a patient with CAD and stent placement in 2014. Says his pain characteristic is similar to his MI in the past, will be admitted to a telemetry bed, cycle troponin, place on aspirin and Nitropaste, not on beta blocker due to severe COPD, TTE stable with EF 60% with stable wall motion, cardiology has been consulted and they are planning for a left heart cath on Monday.    2. Advanced COPD with mild exacerbation . Patient is moving minimum air bilaterally but this likely is his baseline, he does not appear to be in any distress, he is not requiring any oxygen at this time, pending sputum Gram stain culture, from Solu-Medrol to prednisone and continue oral Levaquin. Continue supportive care with nebulizer treatments as needed.   3. Dyslipidemia. Continue home medications.    4. GERD. On PPI continue.   5. Mild leukocytosis. Reactionary due to #2 above. Treatment as in #2 above.    6. Essential hypertension. Home dose ARB and Norvasc continued.   7. Hypokalemia. Replaced and stable.   8. Steroid-induced hyperglycemia. Sliding scale.  CBG (last 3)   Recent Labs  12/26/14 0747  GLUCAP 112*      Code Status : Full  Family Communication  : None present  Disposition Plan  : Home  after heart cath  Consults  :  Cardiology  Procedures  :    TTE  - Left ventricle: The cavity size was normal. There was mildconcentric hypertrophy. Systolic function was normal. The estimated ejection fraction was in the range of 55% to 60%. Wallmotion was normal; there were no regional wall motionabnormalities. Doppler parameters are consistent with abnormal left ventricular relaxation (grade 1 diastolic dysfunction). - Aortic root: The aortic root was mildly dilated.  DVT Prophylaxis  :  Lovenox   Lab Results  Component Value Date   PLT 293 12/24/2014    Inpatient Medications  Scheduled Meds: . amLODipine  5 mg Oral Daily  . arformoterol  15 mcg Nebulization BID  . aspirin EC  81 mg Oral Daily  . budesonide  0.5 mg Nebulization BID  . enoxaparin (LOVENOX) injection  40 mg Subcutaneous Q24H  . ezetimibe  10 mg Oral Daily  . insulin aspart  0-5 Units Subcutaneous QHS  . insulin aspart  0-9 Units Subcutaneous TID WC  . levofloxacin  500 mg Oral Daily  . losartan  50 mg Oral Daily  . nitroGLYCERIN  0.5 inch Topical 3 times per day  . pantoprazole  40 mg Oral Daily  . potassium chloride SA  20 mEq Oral Daily  . predniSONE  50 mg Oral Q breakfast  . sodium chloride  3 mL Intravenous Q12H  . tiotropium  18 mcg Inhalation Daily   Continuous Infusions:  PRN Meds:.acetaminophen, guaiFENesin, guaiFENesin-dextromethorphan, HYDROcodone-acetaminophen, nitroGLYCERIN, [DISCONTINUED] ondansetron **OR** ondansetron (ZOFRAN) IV, polyethylene glycol  Antibiotics  :     Anti-infectives    Start     Dose/Rate Route Frequency Ordered Stop   12/23/14 1500  levofloxacin (LEVAQUIN) tablet 500 mg     500 mg Oral Daily 12/23/14 1413          Objective:   Filed Vitals:   12/25/14 2002 12/25/14 2020 12/26/14 0453 12/26/14 0757  BP:  132/76 155/87   Pulse:  89 82   Temp:  98.6 F (37 C) 97.5 F (36.4 C)   TempSrc:  Oral Oral   Resp:  20 20   Height:      Weight:   61.825 kg  (136 lb 4.8 oz)   SpO2: 98% 98% 97% 98%    Wt Readings from Last 3 Encounters:  12/26/14 61.825 kg (136 lb 4.8 oz)  11/22/14 62.596 kg (138 lb)  05/01/14 59.5 kg (131 lb 2.8 oz)     Intake/Output Summary (Last 24 hours) at 12/26/14 0850 Last data filed at 12/25/14 1849  Gross per 24 hour  Intake    840 ml  Output      0 ml  Net    840 ml     Physical Exam  Awake Alert, Oriented X 3, No new F.N deficits, Normal affect [place].AT,PERRAL Supple Neck,No JVD, No cervical lymphadenopathy appriciated.  Symmetrical Chest wall movement, minimal air movement bilaterally, no wheezing RRR,No Gallops,Rubs or new Murmurs, No Parasternal Heave +ve B.Sounds, Abd Soft, No tenderness, No organomegaly appriciated, No rebound - guarding or rigidity. No Cyanosis, Clubbing or edema, No new Rash or bruise     Data Review:   Micro Results No results found for this or any previous visit (from the past 240 hour(s)).  Radiology Reports Dg Chest Port 1 View  12/23/2014  CLINICAL DATA:  Chest pain and shortness of Breath EXAM: PORTABLE CHEST - 1 VIEW COMPARISON:  11/22/2014 FINDINGS: The heart size and mediastinal contours are within normal limits. Both lungs are clear. The visualized skeletal structures are unremarkable. IMPRESSION: No acute abnormality noted. Electronically Signed   By: Inez Catalina M.D.   On: 12/23/2014 11:30     CBC  Recent Labs Lab 12/23/14 1054 12/24/14 0118  WBC 14.2* 13.2*  HGB 15.4 14.9  HCT 46.3 43.8  PLT 307 293  MCV 91.5 90.7  MCH 30.4 30.8  MCHC 33.3 34.0  RDW 14.5 14.3  LYMPHSABS 2.3  --   MONOABS 0.8  --   EOSABS 0.3  --   BASOSABS 0.0  --     Chemistries   Recent Labs Lab 12/23/14 1054 12/24/14 0118 12/25/14 1015  NA 141 138 139  K 3.3* 4.7 4.2  CL 103 102 105  CO2 29 25 24   GLUCOSE 90 317* 338*  BUN 15 21* 31*  CREATININE 0.80 0.95 1.06  CALCIUM 8.6* 8.9 8.6*  MG  --  2.2  --   AST 19  --   --   ALT 45  --   --   ALKPHOS 67  --   --     BILITOT 0.8  --   --    ------------------------------------------------------------------------------------------------------------------ estimated creatinine clearance is 55.1 mL/min (by C-G formula based on Cr of 1.06). ------------------------------------------------------------------------------------------------------------------ No results for input(s):  HGBA1C in the last 72 hours. ------------------------------------------------------------------------------------------------------------------ No results for input(s): CHOL, HDL, LDLCALC, TRIG, CHOLHDL, LDLDIRECT in the last 72 hours. ------------------------------------------------------------------------------------------------------------------ No results for input(s): TSH, T4TOTAL, T3FREE, THYROIDAB in the last 72 hours.  Invalid input(s): FREET3 ------------------------------------------------------------------------------------------------------------------ No results for input(s): VITAMINB12, FOLATE, FERRITIN, TIBC, IRON, RETICCTPCT in the last 72 hours.  Coagulation profile  Recent Labs Lab 12/23/14 2045  INR 0.99    No results for input(s): DDIMER in the last 72 hours.  Cardiac Enzymes  Recent Labs Lab 12/23/14 1342 12/23/14 2045 12/24/14 0118  TROPONINI <0.03 <0.03 <0.03   ------------------------------------------------------------------------------------------------------------------ Invalid input(s): POCBNP   Time Spent in minutes  35   SINGH,PRASHANT K M.D on 12/26/2014 at 8:50 AM  Between 7am to 7pm - Pager - [phone]  After 7pm go to [email or web address] - password [facility]  [facility] -  Office  [phone]

## 2014-12-27 ENCOUNTER — Encounter (HOSPITAL_COMMUNITY): Payer: Self-pay | Admitting: Interventional Cardiology

## 2014-12-27 ENCOUNTER — Telehealth: Payer: Self-pay | Admitting: Pulmonary Disease

## 2014-12-27 ENCOUNTER — Encounter (HOSPITAL_COMMUNITY)
Admission: EM | Disposition: A | Discharge status: Home / Self Care | Payer: Self-pay | Source: Home / Self Care | Attending: Internal Medicine

## 2014-12-27 DIAGNOSIS — I25119 Atherosclerotic heart disease of native coronary artery with unspecified angina pectoris: Secondary | ICD-10-CM

## 2014-12-27 DIAGNOSIS — E1159 Type 2 diabetes mellitus with other circulatory complications: Secondary | ICD-10-CM | POA: Insufficient documentation

## 2014-12-27 HISTORY — PX: CARDIAC CATHETERIZATION: SHX172

## 2014-12-27 LAB — BASIC METABOLIC PANEL
Anion gap: 7 (ref 5–15)
BUN: 32 mg/dL — AB (ref 6–20)
CO2: 29 mmol/L (ref 22–32)
Calcium: 9.1 mg/dL (ref 8.9–10.3)
Chloride: 105 mmol/L (ref 101–111)
Creatinine, Ser: 0.93 mg/dL (ref 0.61–1.24)
GFR calc Af Amer: >60 mL/min (ref >60)
GLUCOSE: 116 mg/dL — AB (ref 65–99)
POTASSIUM: 4 mmol/L (ref 3.5–5.1)
SODIUM: 141 mmol/L (ref 135–145)

## 2014-12-27 LAB — GLUCOSE, CAPILLARY: GLUCOSE-CAPILLARY: 284 mg/dL — AB (ref 65–99)

## 2014-12-27 LAB — MAGNESIUM: Magnesium: 2.1 mg/dL (ref 1.7–2.4)

## 2014-12-27 SURGERY — LEFT HEART CATH AND CORONARY ANGIOGRAPHY
Anesthesia: LOCAL

## 2014-12-27 MED ORDER — ACETAMINOPHEN 325 MG PO TABS: 650.0000 mg | ORAL_TABLET | ORAL | Status: DC | PRN

## 2014-12-27 MED ORDER — SODIUM CHLORIDE 0.9 % IJ SOLN: 3.0000 mL | INTRAMUSCULAR | Status: DC | PRN

## 2014-12-27 MED ORDER — ATORVASTATIN CALCIUM 40 MG PO TABS: 40.0000 mg | ORAL_TABLET | Freq: Every day | ORAL | Status: DC

## 2014-12-27 MED ORDER — SODIUM CHLORIDE 0.9 % IV SOLN: 250.0000 mL | INTRAVENOUS | Status: DC | PRN

## 2014-12-27 MED ORDER — MIDAZOLAM HCL 2 MG/2ML IJ SOLN
INTRAMUSCULAR | Status: AC
  Filled 2014-12-27: qty 2

## 2014-12-27 MED ORDER — SODIUM CHLORIDE 0.9 % WEIGHT BASED INFUSION
1.0000 mL/kg/h | INTRAVENOUS | Status: DC
Start: 2014-12-28 — End: 2014-12-27

## 2014-12-27 MED ORDER — LIDOCAINE HCL (PF) 1 % IJ SOLN
INTRAMUSCULAR | Status: DC | PRN
  Administered 2014-12-27: 09:00:00

## 2014-12-27 MED ORDER — HEPARIN SODIUM (PORCINE) 1000 UNIT/ML IJ SOLN
INTRAMUSCULAR | Status: DC | PRN
  Administered 2014-12-27: 3500 [IU] via INTRAVENOUS

## 2014-12-27 MED ORDER — ASPIRIN 81 MG PO CHEW
324.0000 mg | CHEWABLE_TABLET | ORAL | Status: AC
  Administered 2014-12-27: 324 mg via ORAL
  Filled 2014-12-27: qty 4

## 2014-12-27 MED ORDER — ASPIRIN EC 81 MG PO TBEC
81.0000 mg | DELAYED_RELEASE_TABLET | Freq: Every day | ORAL | Status: DC
  Administered 2014-12-27: 81 mg via ORAL

## 2014-12-27 MED ORDER — BISOPROLOL FUMARATE 10 MG PO TABS: 10.0000 mg | ORAL_TABLET | Freq: Every day | ORAL | Status: DC

## 2014-12-27 MED ORDER — HEPARIN SODIUM (PORCINE) 1000 UNIT/ML IJ SOLN
INTRAMUSCULAR | Status: AC
  Filled 2014-12-27: qty 1

## 2014-12-27 MED ORDER — SODIUM CHLORIDE 0.9 % WEIGHT BASED INFUSION
3.0000 mL/kg/h | INTRAVENOUS | Status: AC
  Administered 2014-12-27: 3 mL/kg/h via INTRAVENOUS

## 2014-12-27 MED ORDER — PREDNISONE 10 MG PO TABS: ORAL_TABLET | ORAL | Status: DC

## 2014-12-27 MED ORDER — LEVOFLOXACIN 500 MG PO TABS: 500.0000 mg | ORAL_TABLET | Freq: Every day | ORAL | Status: DC

## 2014-12-27 MED ORDER — SODIUM CHLORIDE 0.9 % WEIGHT BASED INFUSION
3.0000 mL/kg/h | INTRAVENOUS | Status: DC
Start: 2014-12-28 — End: 2014-12-27

## 2014-12-27 MED ORDER — VERAPAMIL HCL 2.5 MG/ML IV SOLN
INTRAVENOUS | Status: DC | PRN
  Administered 2014-12-27: 2 mL via INTRA_ARTERIAL

## 2014-12-27 MED ORDER — SODIUM CHLORIDE 0.9 % IJ SOLN: 3.0000 mL | Freq: Two times a day (BID) | INTRAMUSCULAR | Status: DC

## 2014-12-27 MED ORDER — FENTANYL CITRATE (PF) 100 MCG/2ML IJ SOLN
INTRAMUSCULAR | Status: AC
  Filled 2014-12-27: qty 2

## 2014-12-27 MED ORDER — PREDNISONE 20 MG PO TABS: 20.0000 mg | ORAL_TABLET | Freq: Every day | ORAL | Status: DC

## 2014-12-27 MED ORDER — SODIUM CHLORIDE 0.9 % WEIGHT BASED INFUSION: 1.0000 mL/kg/h | INTRAVENOUS | Status: AC

## 2014-12-27 MED ORDER — ONDANSETRON HCL 4 MG/2ML IJ SOLN: 4.0000 mg | Freq: Four times a day (QID) | INTRAMUSCULAR | Status: DC | PRN

## 2014-12-27 MED ORDER — PREDNISONE 20 MG PO TABS
40.0000 mg | ORAL_TABLET | Freq: Every day | ORAL | Status: AC
  Administered 2014-12-27: 40 mg via ORAL
  Filled 2014-12-27: qty 2

## 2014-12-27 MED ORDER — MIDAZOLAM HCL 2 MG/2ML IJ SOLN
INTRAMUSCULAR | Status: DC | PRN
  Administered 2014-12-27: 2 mg via INTRAVENOUS

## 2014-12-27 MED ORDER — SODIUM CHLORIDE 0.9 % WEIGHT BASED INFUSION
1.0000 mL/kg/h | INTRAVENOUS | Status: DC
  Administered 2014-12-27: 1 mL/kg/h via INTRAVENOUS

## 2014-12-27 MED ORDER — HEPARIN (PORCINE) IN NACL 2-0.9 UNIT/ML-% IJ SOLN
INTRAMUSCULAR | Status: AC
  Filled 2014-12-27: qty 1000

## 2014-12-27 MED ORDER — PANTOPRAZOLE SODIUM 40 MG PO TBEC: 40.0000 mg | DELAYED_RELEASE_TABLET | Freq: Every day | ORAL | Status: DC

## 2014-12-27 MED ORDER — NITROGLYCERIN 1 MG/10 ML FOR IR/CATH LAB
INTRA_ARTERIAL | Status: AC
  Filled 2014-12-27: qty 10

## 2014-12-27 MED ORDER — VERAPAMIL HCL 2.5 MG/ML IV SOLN
INTRAVENOUS | Status: AC
  Filled 2014-12-27: qty 2

## 2014-12-27 MED ORDER — FENTANYL CITRATE (PF) 100 MCG/2ML IJ SOLN
INTRAMUSCULAR | Status: DC | PRN
  Administered 2014-12-27: 25 ug via INTRAVENOUS

## 2014-12-27 MED ORDER — LIDOCAINE HCL (PF) 1 % IJ SOLN
INTRAMUSCULAR | Status: AC
Start: 2014-12-27 — End: 2014-12-27
  Filled 2014-12-27: qty 30

## 2014-12-27 SURGICAL SUPPLY — 12 items
CATH INFINITI 5 FR JL3.5 (CATHETERS) ×2 IMPLANT
CATH INFINITI 5FR ANG PIGTAIL (CATHETERS) ×2 IMPLANT
CATH INFINITI JR4 5F (CATHETERS) ×2 IMPLANT
DEVICE RAD COMP TR BAND LRG (VASCULAR PRODUCTS) ×2 IMPLANT
GLIDESHEATH SLEND SS 6F .021 (SHEATH) ×2 IMPLANT
KIT HEART LEFT (KITS) ×2 IMPLANT
PACK CARDIAC CATHETERIZATION (CUSTOM PROCEDURE TRAY) ×2 IMPLANT
SYR MEDRAD MARK V 150ML (SYRINGE) ×2 IMPLANT
TRANSDUCER W/STOPCOCK (MISCELLANEOUS) ×2 IMPLANT
TUBING CIL FLEX 10 FLL-RA (TUBING) ×2 IMPLANT
WIRE HI TORQ VERSACORE-J 145CM (WIRE) ×1 IMPLANT
WIRE SAFE-T 1.5MM-J .035X260CM (WIRE) ×2 IMPLANT

## 2014-12-27 NOTE — Discharge Summary (Signed)
[name], is a 69 y.o. male  DOB 1945-11-23  MRN [id number].  Admission date:  12/23/2014  Admitting Physician  Thurnell Lose, MD  Discharge Date:  12/27/2014   Primary MD  [name], MD  Recommendations for primary care physician for things to follow:   Monitor secondary to his factors for CAD, close outpatient pulmonary and cardiology follow-up   Admission Diagnosis  COPD exacerbation ([place]) [J44.1] Chest pain, unspecified chest pain type [R07.9]   Discharge Diagnosis  COPD exacerbation ([place]) [J44.1] Chest pain, unspecified chest pain type [R07.9]    Principal Problem:   Chest pain, negative MI, secondary due to COPD exacerbation  Active Problems:   COPD with exacerbation ([place])   GERD   Essential hypertension   Tobacco abuse, has stopped   CAD (coronary artery disease), native coronary artery   Uncontrolled diabetes mellitus without complication ([place])   Hypokalemia   PVC's (premature ventricular contractions)      Past Medical History  Diagnosis Date  . COPD (chronic obstructive pulmonary disease) ([place])     former smoker  . GERD (gastroesophageal reflux disease)   . CAD (coronary artery disease), native coronary artery 6/11, 2/14, 6/14; 7/15    a. thought due to vasospasm . cath - CAD LAD 40-50%, LCX 40%, RCA 25% EF 65%. lexiscan myoview EF 68% w/o evidence of ischemia or infarct b. STEMI s/p BMS-mid RCA c. BMS x 2- prox RCA, PLOM  . HTN (hypertension)   . Asthma   . Hyperlipidemia   . Tobacco abuse   . Allergic rhinitis     Past Surgical History  Procedure Laterality Date  . Left foot surgery      repair - and ankle   . Coronary angioplasty with stent placement  02/15/12    20% distal LM, 30-40% mid LAD, 50% ostial diag, 50-60% mid LCx, 99% focal mid RCA stenosis s/p BMS; 20%  distal RCA disease, 50% prox PLB; LVEF 55-65%  . Coronary angioplasty with stent placement  07/01/12    30% prox LAD, 30% mid LAD, 20% mid LCx, 90% prox RCA s/p BMS, patent mid RCA stent, 30-40% distal RCA, 80% PLB s/p BMS; EF 55-65%  . Cardiac catheterization  08/03/13    non obstructive disease LCX 20%; LAD 20%; stents in prox RCA and mid are patent, mild in stent stenosis POM, 30%, normal EF 55-65%  . Left heart catheterization with coronary angiogram N/A 02/15/2012    Procedure: LEFT HEART CATHETERIZATION WITH CORONARY ANGIOGRAM;  Surgeon: Peter M [name], MD;  Location[facility] CATH LAB;  Service: Cardiovascular;  Laterality: N/A;  . Percutaneous coronary stent intervention (pci-s) N/A 02/15/2012    Procedure: PERCUTANEOUS CORONARY STENT INTERVENTION (PCI-S);  Surgeon: Peter M [name], MD;  Location: [facility] CATH LAB;  Service: Cardiovascular;  Laterality: N/A;  . Left heart catheterization with coronary angiogram N/A 07/01/2012    Procedure: LEFT HEART CATHETERIZATION WITH CORONARY ANGIOGRAM;  Surgeon: Peter M [name], MD;  Location: [facility] CATH LAB;  Service: Cardiovascular;  Laterality: N/A;  .  Percutaneous coronary stent intervention (pci-s)  07/01/2012    Procedure: PERCUTANEOUS CORONARY STENT INTERVENTION (PCI-S);  Surgeon: Peter M [name], MD;  Location: [facility] CATH LAB;  Service: Cardiovascular;;  . Left heart catheterization with coronary angiogram N/A 08/03/2013    Procedure: LEFT HEART CATHETERIZATION WITH CORONARY ANGIOGRAM;  Surgeon: Peter M [name], MD;  Location: [facility] CATH LAB;  Service: Cardiovascular;  Laterality: N/A;       HPI  from the history and physical done on the day of admission:    [name] is a 69 y.o. male, with history of COPD, not on home oxygen but has had multiple COPD exacerbations, CAD status post bare metal stent placement in 2014, GERD, essential hypertension, dyslipidemia, history of tobacco abuse says he has quit several years ago. Who has had several COPD exacerbations last one 1  month ago under the care of Dr. [place] Bells comes in with chief complaints of productive cough for the last few days, gradually worsening shortness of breath and some left-sided sharp chest pain which is nonradiating, present for 24 hours, worsened by activity better with rest, says this is similar to his previous MI. Rates the pain currently 2 out of 10 after he received some nitroglycerin, his chest pain improved considerably after nitroglycerin.  In the ER he was diagnosed with chest pain, mild COPD exacerbation and I was called to admit the patient.     Hospital Course:     1. Chest pain in a patient with CAD and stent placement in 2014. Says his pain characteristic is similar to his MI in the past, ruled out for MI, TTE stable with EF 60% with stable wall motion, cardiology was consulted and he underwent left heart catheterization which did show CAD but did not require stenting, aggressive secondary risk factor management recommended. He is symptom free. Will be placed on aspirin, bisoprolol along with statin with close outpatient cardiology follow-up.   2. Advanced COPD with mild exacerbation . Patient is moving minimum air bilaterally but this likely is his baseline, he does not appear to be in any distress, he is not requiring any oxygen at this time, pending sputum Gram stain culture, is being transitioned from Solu-Medrol to prednisone 2 days ago, continue taper and continue oral Levaquin. Did not require oxygen, will follow with his pulmonary specialist postdischarge.   3. Dyslipidemia. Continue home medications.    4. GERD. On PPI continue.   5. Mild leukocytosis. Reactionary due to #2 above. Treatment as in #2 above.    6. Essential hypertension. Home dose ARB and Norvasc continued.   7. Hypokalemia. Replaced and stable.      Discharge Condition: Stable  Follow UP  Follow-up Information    Follow up with AVBUERE,EDWIN A, MD. Schedule an appointment as soon as possible  for a visit in 1 week.   Specialty:  Internal Medicine   Contact information:   [address] [place] [address] [phone]       Follow up with Jettie Booze., MD. Schedule an appointment as soon as possible for a visit in 1 week.   Specialties:  Cardiology, Radiology, Interventional Cardiology   Contact information:   [id number] [place] [address] [phone]       Follow up with Simonne Maffucci, MD. Schedule an appointment as soon as possible for a visit in 1 week.   Specialty:  Pulmonary Disease   Why:  COPD   Contact information:   [place]  [address] [phone]        Consults obtained - Cards, Pulmonary  Diet and Activity recommendation: See Discharge Instructions below  Discharge Instructions           Discharge Instructions    Diet - low sodium heart healthy    Complete by:  As directed      Discharge instructions    Complete by:  As directed   Follow with Primary MD [name] A, MD in 7 days   Get CBC, CMP, 2 view Chest X ray checked  by Primary MD next visit.    Activity: As tolerated with Full fall precautions use walker/cane & assistance as needed   Disposition Home    Diet:   Heart Healthy .  For Heart failure patients - Check your Weight same time everyday, if you gain over 2 pounds, or you develop in leg swelling, experience more shortness of breath or chest pain, call your Primary MD immediately. Follow Cardiac Low Salt Diet and 1.5 lit/day fluid restriction.   On your next visit with your primary care physician please Get Medicines reviewed and adjusted.   Please request your Prim.MD to go over all Hospital Tests and Procedure/Radiological results at the follow up, please get all Hospital records sent to your Prim MD by signing hospital release before you go home.   If you experience worsening of your admission symptoms, develop shortness of breath, life threatening emergency,  suicidal or homicidal thoughts you must seek medical attention immediately by calling 911 or calling your MD immediately  if symptoms less severe.  You Must read complete instructions/literature along with all the possible adverse reactions/side effects for all the Medicines you take and that have been prescribed to you. Take any new Medicines after you have completely understood and accpet all the possible adverse reactions/side effects.   Do not drive, operating heavy machinery, perform activities at heights, swimming or participation in water activities or provide baby sitting services if your were admitted for syncope or siezures until you have seen by Primary MD or a Neurologist and advised to do so again.  Do not drive when taking Pain medications.    Do not take more than prescribed Pain, Sleep and Anxiety Medications  Special Instructions: If you have smoked or chewed Tobacco  in the last 2 yrs please stop smoking, stop any regular Alcohol  and or any Recreational drug use.  Wear Seat belts while driving.   Please note  You were cared for by a hospitalist during your hospital stay. If you have any questions about your discharge medications or the care you received while you were in the hospital after you are discharged, you can call the unit and asked to speak with the hospitalist on call if the hospitalist that took care of you is not available. Once you are discharged, your primary care physician will handle any further medical issues. Please note that NO REFILLS for any discharge medications will be authorized once you are discharged, as it is imperative that you return to your primary care physician (or establish a relationship with a primary care physician if you do not have one) for your aftercare needs so that they can reassess your need for medications and monitor your lab values.     Increase activity slowly    Complete by:  As directed              Discharge Medications         Medication List  STOP taking these medications        ibuprofen 200 MG tablet  Commonly known as:  ADVIL,MOTRIN     naproxen sodium 220 MG tablet  Commonly known as:  ANAPROX      TAKE these medications        acetaminophen 500 MG tablet  Commonly known as:  TYLENOL  Take 500 mg by mouth every 6 (six) hours as needed for mild pain.     AEROCHAMBER MV inhaler  Use as instructed     albuterol 108 (90 BASE) MCG/ACT inhaler  Commonly known as:  PROVENTIL HFA  Inhale 2 puffs into the lungs 2 (two) times daily.     albuterol (2.5 MG/3ML) 0.083% nebulizer solution  Commonly known as:  PROVENTIL  USE 1 VIAL IN NEBULIZER TWICE A DAY AS NEEDED     amLODipine 5 MG tablet  Commonly known as:  NORVASC  Take 1 tablet (5 mg total) by mouth daily.     aspirin 81 MG tablet  Take 81 mg by mouth daily.     atorvastatin 40 MG tablet  Commonly known as:  LIPITOR  Take 1 tablet (40 mg total) by mouth daily.     bisoprolol 10 MG tablet  Commonly known as:  ZEBETA  Take 1 tablet (10 mg total) by mouth daily.     budesonide-formoterol 160-4.5 MCG/ACT inhaler  Commonly known as:  SYMBICORT  Inhale 2 puffs into the lungs 2 (two) times daily.     ezetimibe 10 MG tablet  Commonly known as:  ZETIA  Take 1 tablet (10 mg total) by mouth daily.     guaiFENesin 600 MG 12 hr tablet  Commonly known as:  MUCINEX  Take 600 mg by mouth 2 (two) times daily as needed for cough or to loosen phlegm.     levofloxacin 500 MG tablet  Commonly known as:  LEVAQUIN  Take 1 tablet (500 mg total) by mouth daily.     losartan 50 MG tablet  Commonly known as:  COZAAR  Take 1 tablet (50 mg total) by mouth daily.     nitroGLYCERIN 0.4 MG SL tablet  Commonly known as:  NITROSTAT  Place 1 tablet (0.4 mg total) under the tongue every 5 (five) minutes as needed for chest pain.     omeprazole 20 MG capsule  Commonly known as:  PRILOSEC  TAKE 1 CAPSULE (20 MG TOTAL) BY MOUTH 2 (TWO) TIMES DAILY  BEFORE A MEAL.     pantoprazole 40 MG tablet  Commonly known as:  PROTONIX  Take 40 mg by mouth daily.     potassium chloride SA 20 MEQ tablet  Commonly known as:  KLOR-CON M20  Take 1 tablet (20 mEq total) by mouth daily. NEEDS TO MAKE APPOINTMENT WITH PRIMARY CARDIOLOGIST FOR FURTHER REFILLS [phone]     predniSONE 10 MG tablet  Commonly known as:  DELTASONE  Take 40mg  po daily for 3 days, then take 30mg  po daily for 3 days, then take 20mg  po daily for two days, then take 10mg  po daily for 2 days     tiotropium 18 MCG inhalation capsule  Commonly known as:  SPIRIVA HANDIHALER  Place 1 capsule (18 mcg total) into inhaler and inhale daily.        Major procedures and Radiology Reports - PLEASE review detailed and final reports for all details, in brief -   Left Heart Cath and Coronary Angiography    Conclusion  Ost LAD to Prox LAD lesion, 40% stenosed.  Ost 2nd Diag to 2nd Diag lesion, 80% stenosed.  Mid Cx lesion, 50% stenosed.  The left ventricular systolic function is normal.  Patent stents in the RCA.  Mild to moderate left sided disease.  Continue aggressive secondary prevention. He will be watched for bleeding at his radial site. He will then go back to [facility] with further w/u per the internal medicine team.     TTE  - Left ventricle: The cavity size was normal. There was mildconcentric hypertrophy. Systolic function was normal. The estimated ejection fraction was in the range of 55% to 60%. Wallmotion was normal; there were no regional wall motionabnormalities. Doppler parameters are consistent with abnormal left ventricular relaxation (grade 1 diastolic dysfunction). - Aortic root: The aortic root was mildly dilated.    Dg Chest Port 1 View  12/23/2014  CLINICAL DATA:  Chest pain and shortness of Breath EXAM: PORTABLE CHEST - 1 VIEW COMPARISON:  11/22/2014 FINDINGS: The heart size and mediastinal contours are within normal limits.  Both lungs are clear. The visualized skeletal structures are unremarkable. IMPRESSION: No acute abnormality noted. Electronically Signed   By: Inez Catalina M.D.   On: 12/23/2014 11:30    Micro Results      Recent Results (from the past 240 hour(s))  Culture, expectorated sputum-assessment     Status: None   Collection Time: 12/26/14 12:44 PM  Result Value Ref Range Status   Specimen Description SPUTUM  Final   Special Requests NONE  Final   Sputum evaluation   Final    THIS SPECIMEN IS ACCEPTABLE. RESPIRATORY CULTURE REPORT TO FOLLOW.   Report Status 12/26/2014 FINAL  Final  Culture, respiratory (NON-Expectorated)     Status: None (Preliminary result)   Collection Time: 12/26/14 12:44 PM  Result Value Ref Range Status   Specimen Description SPUTUM  Final   Special Requests NONE  Final   Gram Stain   Final    FEW WBC PRESENT,BOTH PMN AND MONONUCLEAR ABUNDANT SQUAMOUS EPITHELIAL CELLS PRESENT ABUNDANT GRAM POSITIVE COCCI IN CLUSTERS Performed at [facility]    Culture   Final    Culture reincubated for better growth Performed at [facility]    Report Status PENDING  Incomplete    Today   Subjective    Trevor Haggar today has no headache,no chest abdominal pain,no new weakness tingling or numbness, feels much better wants to go home today.     Objective   Blood pressure 159/75, pulse 78, temperature 97.9 F (36.6 C), temperature source Oral, resp. rate 20, height 5\' 4"  (1.626 m), weight 62.1 kg (136 lb 14.5 oz), SpO2 96 %.  No intake or output data in the 24 hours ending 12/27/14 1225  Exam Awake Alert, Oriented x 3, No new F.N deficits, Normal affect [place].AT,PERRAL Supple Neck,No JVD, No cervical lymphadenopathy appriciated.  Symmetrical Chest wall movement, Mod air movement bilaterally, few wheezes RRR,No Gallops,Rubs or new Murmurs, No Parasternal Heave +ve B.Sounds, Abd Soft, Non tender, No organomegaly appriciated, No rebound -guarding or  rigidity. No Cyanosis, Clubbing or edema, No new Rash or bruise   Data Review   CBC w Diff:  Lab Results  Component Value Date   WBC 13.2* 12/24/2014   HGB 14.9 12/24/2014   HCT 43.8 12/24/2014   PLT 293 12/24/2014   LYMPHOPCT 16 12/23/2014   MONOPCT 6 12/23/2014   EOSPCT 2 12/23/2014   BASOPCT 0 12/23/2014    CMP:  Lab Results  Component Value Date   NA 141 12/27/2014   K 4.0 12/27/2014   CL 105 12/27/2014   CO2 29 12/27/2014   BUN 32* 12/27/2014   CREATININE 0.93 12/27/2014   PROT 6.5 12/23/2014   ALBUMIN 3.7 12/23/2014   BILITOT 0.8 12/23/2014   ALKPHOS 67 12/23/2014   AST 19 12/23/2014   ALT 45 12/23/2014  .   Total Time in preparing paper work, data evaluation and todays exam - 35 minutes  Thurnell Lose M.D on 12/27/2014 at 12:25 PM  [facility]   Office  [phone]

## 2014-12-27 NOTE — Progress Notes (Signed)
Pt back to [facility].  Report called to [facility] on 1400

## 2014-12-27 NOTE — Progress Notes (Signed)
Patient: [name] / Admit Date: 12/23/2014 / Date of Encounter: 12/27/2014, 7:40 AM   Subjective: "I feel good today." No further CP. [facility] here to get him for cath.   Objective: Telemetry: NSR, brief run of regular narrow complex SVT yesterday afternoon, occ PVCs Physical Exam: Blood pressure 149/80, pulse 87, temperature 98 F (36.7 C), temperature source Oral, resp. rate 18, height 5\' 4"  (1.626 m), weight 136 lb 14.5 oz (62.1 kg), SpO2 99 %. General: Well developed, well nourished M in no acute distress. Head: Normocephalic, atraumatic, sclera non-icteric, no xanthomas, nares are without discharge. Neck: JVP not elevated. Lungs: Diminished air movement bilaterally to auscultation without wheezes, rales, or rhonchi. Breathing is unlabored. Heart: RRR S1 S2 without murmurs, rubs, or gallops.  Abdomen: Soft, non-tender, non-distended with normoactive bowel sounds. No rebound/guarding. Extremities: No clubbing or cyanosis. No edema. Distal pedal pulses are 2+ and equal bilaterally. Neuro: Alert and oriented X 3. Moves all extremities spontaneously. Psych:  Responds to questions appropriately with a normal affect.   Intake/Output Summary (Last 24 hours) at 12/27/14 0740 Last data filed at 12/26/14 0900  Gross per 24 hour  Intake    240 ml  Output      0 ml  Net    240 ml    Inpatient Medications:  . amLODipine  5 mg Oral Daily  . arformoterol  15 mcg Nebulization BID  . [START ON 12/28/2014] aspirin EC  81 mg Oral Daily  . budesonide      . budesonide  0.5 mg Nebulization BID  . enoxaparin (LOVENOX) injection  40 mg Subcutaneous Q24H  . ezetimibe  10 mg Oral Daily  . insulin aspart  0-5 Units Subcutaneous QHS  . insulin aspart  0-9 Units Subcutaneous TID WC  . levofloxacin  500 mg Oral Daily  . losartan  50 mg Oral Daily  . nitroGLYCERIN  0.5 inch Topical 3 times per day  . pantoprazole  40 mg Oral Daily  . potassium chloride SA  20 mEq Oral Daily  . predniSONE  40 mg  Oral Q breakfast  . sodium chloride  3 mL Intravenous Q12H  . sodium chloride  3 mL Intravenous Q12H  . tiotropium  18 mcg Inhalation Daily   Infusions:  . sodium chloride      Labs:  Recent Labs  12/25/14 1015 12/27/14 0444  NA 139 141  K 4.2 4.0  CL 105 105  CO2 24 29  GLUCOSE 338* 116*  BUN 31* 32*  CREATININE 1.06 0.93  CALCIUM 8.6* 9.1  MG  --  2.1    Radiology/Studies:  Dg Chest Port 1 View  12/23/2014  CLINICAL DATA:  Chest pain and shortness of Breath EXAM: PORTABLE CHEST - 1 VIEW COMPARISON:  11/22/2014 FINDINGS: The heart size and mediastinal contours are within normal limits. Both lungs are clear. The visualized skeletal structures are unremarkable. IMPRESSION: No acute abnormality noted. Electronically Signed   By: Inez Catalina M.D.   On: 12/23/2014 11:30     Assessment and Plan  65M with COPD (intol of BB 2/2 wheezing), CAD (STEMI w/ BMS RCA 02/2012, BMS pRCA & OM 06/2012, non-obs CAD by cath 07/2013), HTN, HL, GERD admitted with chest pain. BNP normal, troponins all negative.  1. Chest pain with history of CAD - patient being taken for LHC this AM. Continue current regimen. Looking back in records it appears he was on atorvastatin at one point but this fell off med list. Can consider resumption  if no adverse reason identified. I did not have a chance to speak with patient about this before he left the floor.   2. Essential HTN - mild elevation which patient has previously attributed to coughing. If this remains up would consider increasing amlodipine (versus addition of Imdur depending on cath result).  3. Advanced COPD with mild exacerbation - per IM.  4. Hypokalemia - improved.  5. Brief run of SVT - no recurrences overnight, asymptomatic. Follow on telemetry.  Signed, Melina Copa PA-C Pager: [phone]   I have examined the patient and reviewed assessment and plan and discussed with patient.  Agree with above as stated.  Mild disease on cath.  Patent  stents.  Continue aggressive secondary prevention.  Carroll Lingelbach S.

## 2014-12-27 NOTE — Telephone Encounter (Signed)
Ask for diane [phone].Hillery Hunter

## 2014-12-27 NOTE — H&P (View-Only) (Signed)
SUBJECTIVE: No chest pain. Has been coughing. Says BP goes up when he coughs due to medication Garlon Hatchet).     Intake/Output Summary (Last 24 hours) at 12/26/14 0857 Last data filed at 12/25/14 1849  Gross per 24 hour  Intake    840 ml  Output      0 ml  Net    840 ml    Current Facility-Administered Medications  Medication Dose Route Frequency Provider Last Rate Last Dose  . acetaminophen (TYLENOL) tablet 500 mg  500 mg Oral Q6H PRN Thurnell Lose, MD      . amLODipine (NORVASC) tablet 5 mg  5 mg Oral Daily Thurnell Lose, MD   5 mg at 12/25/14 1021  . arformoterol (BROVANA) nebulizer solution 15 mcg  15 mcg Nebulization BID Donita Brooks, NP   15 mcg at 12/25/14 1959  . aspirin EC tablet 81 mg  81 mg Oral Daily Thurnell Lose, MD   81 mg at 12/25/14 1021  . budesonide (PULMICORT) nebulizer solution 0.5 mg  0.5 mg Nebulization BID Donita Brooks, NP   0.5 mg at 12/26/14 0755  . enoxaparin (LOVENOX) injection 40 mg  40 mg Subcutaneous Q24H Thurnell Lose, MD   40 mg at 12/25/14 2219  . ezetimibe (ZETIA) tablet 10 mg  10 mg Oral Daily Thurnell Lose, MD   10 mg at 12/25/14 1021  . guaiFENesin (MUCINEX) 12 hr tablet 600 mg  600 mg Oral BID PRN Thurnell Lose, MD   600 mg at 12/25/14 1604  . guaiFENesin-dextromethorphan (ROBITUSSIN DM) 100-10 MG/5ML syrup 5 mL  5 mL Oral Q4H PRN Thurnell Lose, MD   5 mL at 12/26/14 0811  . HYDROcodone-acetaminophen (NORCO/VICODIN) 5-325 MG per tablet 1 tablet  1 tablet Oral Q4H PRN Thurnell Lose, MD      . insulin aspart (novoLOG) injection 0-5 Units  0-5 Units Subcutaneous QHS Thurnell Lose, MD      . insulin aspart (novoLOG) injection 0-9 Units  0-9 Units Subcutaneous TID WC Thurnell Lose, MD   0 Units at 12/26/14 0805  . levofloxacin (LEVAQUIN) tablet 500 mg  500 mg Oral Daily Dara Hoyer, RPH   500 mg at 12/25/14 1604  . losartan (COZAAR) tablet 50 mg  50 mg Oral Daily Thurnell Lose, MD   50 mg at 12/25/14 1021    . nitroGLYCERIN (NITROGLYN) 2 % ointment 0.5 inch  0.5 inch Topical 3 times per day Thurnell Lose, MD   0.5 inch at 12/26/14 0600  . nitroGLYCERIN (NITROSTAT) SL tablet 0.4 mg  0.4 mg Sublingual Q5 min PRN Thurnell Lose, MD      . ondansetron [facility]) injection 4 mg  4 mg Intravenous Q6H PRN Thurnell Lose, MD      . pantoprazole (PROTONIX) EC tablet 40 mg  40 mg Oral Daily Thurnell Lose, MD   40 mg at 12/25/14 1021  . polyethylene glycol (MIRALAX / GLYCOLAX) packet 17 g  17 g Oral Daily PRN Thurnell Lose, MD      . potassium chloride SA (K-DUR,KLOR-CON) CR tablet 20 mEq  20 mEq Oral Daily Thurnell Lose, MD   20 mEq at 12/25/14 1023  . [START ON 12/27/2014] predniSONE (DELTASONE) tablet 40 mg  40 mg Oral Q breakfast Thurnell Lose, MD      . sodium chloride 0.9 % injection 3 mL  3 mL Intravenous  [place]arl[place], MD   3 mL at 12/25/14 1025  . tiotropium (SPIRIVA) inhalation capsule 18 mcg  18 mcg Inhalation Daily Thurnell Lose, MD   18 mcg at 12/26/14 0755    Filed Vitals:   12/25/14 2002 12/25/14 2020 12/26/14 0453 12/26/14 0757  BP:  132/76 155/87   Pulse:  89 82   Temp:  98.6 F (37 C) 97.5 F (36.4 C)   TempSrc:  Oral Oral   Resp:  20 20   Height:      Weight:   136 lb 4.8 oz (61.825 kg)   SpO2: 98% 98% 97% 98%    PHYSICAL EXAM General: NAD HEENT: Normal. Neck: No JVD, no thyromegaly.  Lungs: Clear to auscultation bilaterally with normal respiratory effort. CV: Nondisplaced PMI. Regular rate and rhythm, normal S1/S2, no S3/S4, no murmur. No pretibial edema. No carotid bruit. Normal pedal pulses.  Abdomen: Soft, nontender, no hepatosplenomegaly, no distention.  Neurologic: Alert and oriented x 3.  Psych: Normal affect. Musculoskeletal: Normal range of motion. No gross deformities. Extremities: No clubbing or cyanosis.   TELEMETRY: Reviewed telemetry pt in sinus rhythm/sinus tachycardia with PVC's. LABS: Basic Metabolic Panel:  Recent  Labs  12/24/14 0118 12/25/14 1015  NA 138 139  K 4.7 4.2  CL 102 105  CO2 25 24  GLUCOSE 317* 338*  BUN 21* 31*  CREATININE 0.95 1.06  CALCIUM 8.9 8.6*  MG 2.2  --    Liver Function Tests:  Recent Labs  12/23/14 1054  AST 19  ALT 45  ALKPHOS 67  BILITOT 0.8  PROT 6.5  ALBUMIN 3.7    Recent Labs  12/23/14 1054  LIPASE 22   CBC:  Recent Labs  12/23/14 1054 12/24/14 0118  WBC 14.2* 13.2*  NEUTROABS 10.8*  --   HGB 15.4 14.9  HCT 46.3 43.8  MCV 91.5 90.7  PLT 307 293   Cardiac Enzymes:  Recent Labs  12/23/14 1342 12/23/14 2045 12/24/14 0118  TROPONINI <0.03 <0.03 <0.03   BNP: Invalid input(s): POCBNP D-Dimer: No results for input(s): DDIMER in the last 72 hours. Hemoglobin A1C: No results for input(s): HGBA1C in the last 72 hours. Fasting Lipid Panel: No results for input(s): CHOL, HDL, LDLCALC, TRIG, CHOLHDL, LDLDIRECT in the last 72 hours. Thyroid Function Tests: No results for input(s): TSH, T4TOTAL, T3FREE, THYROIDAB in the last 72 hours.  Invalid input(s): FREET3 Anemia Panel: No results for input(s): VITAMINB12, FOLATE, FERRITIN, TIBC, IRON, RETICCTPCT in the last 72 hours.  RADIOLOGY: Dg Chest Port 1 View  12/23/2014  CLINICAL DATA:  Chest pain and shortness of Breath EXAM: PORTABLE CHEST - 1 VIEW COMPARISON:  11/22/2014 FINDINGS: The heart size and mediastinal contours are within normal limits. Both lungs are clear. The visualized skeletal structures are unremarkable. IMPRESSION: No acute abnormality noted. Electronically Signed   By: Inez Catalina M.D.   On: 12/23/2014 11:30      ASSESSMENT AND PLAN: 1. Chest pain in context of CAD with prior PCI: No recurrent symptoms over weekend. Cath planned for Monday. Rhythm is sinus with PVC's. Potassium is normal, hypokalemic three days ago. If chest pain or tachycardia recurs, consider use of diltiazem, as beta blockers would only worsen bronchospastic disease. Continue ASA, amlodipine, nitro  paste, Zetia, and losartan.  2. Essential HTN: Elevated but patient attributes to coughing with Brovana. Will monitor. Continue amlodipine and losartan.  3. Hypokalemia: K normal yesterday. BMET pending today.   Kate Sable, M.D., F.A.C.C.

## 2014-12-27 NOTE — Progress Notes (Signed)
TR BAND REMOVAL  LOCATION:    Right radial   DEFLATED PER PROTOCOL:    Yes.    TIME BAND OFF / DRESSING APPLIED:    1045a   SITE UPON ARRIVAL:    Level 0  SITE AFTER BAND REMOVAL:    Level 0  CIRCULATION SENSATION AND MOVEMENT:    Within Normal Limits   Yes.    COMMENTS:   VS remain stable.

## 2014-12-27 NOTE — Discharge Instructions (Signed)
Follow with Primary MD AVBUERE,EDWIN A, MD in 7 days  ° °Get CBC, CMP, 2 view Chest X ray checked  by Primary MD next visit.  ° ° °Activity: As tolerated with Full fall precautions use walker/cane & assistance as needed ° ° °Disposition Home  ° ° °Diet:   Heart Healthy   ° °For Heart failure patients - Check your Weight same time everyday, if you gain over 2 pounds, or you develop in leg swelling, experience more shortness of breath or chest pain, call your Primary MD immediately. Follow Cardiac Low Salt Diet and 1.5 lit/day fluid restriction. ° ° °On your next visit with your primary care physician please Get Medicines reviewed and adjusted. ° ° °Please request your Prim.MD to go over all Hospital Tests and Procedure/Radiological results at the follow up, please get all Hospital records sent to your Prim MD by signing hospital release before you go home. ° ° °If you experience worsening of your admission symptoms, develop shortness of breath, life threatening emergency, suicidal or homicidal thoughts you must seek medical attention immediately by calling 911 or calling your MD immediately  if symptoms less severe. ° °You Must read complete instructions/literature along with all the possible adverse reactions/side effects for all the Medicines you take and that have been prescribed to you. Take any new Medicines after you have completely understood and accpet all the possible adverse reactions/side effects.  ° °Do not drive, operating heavy machinery, perform activities at heights, swimming or participation in water activities or provide baby sitting services if your were admitted for syncope or siezures until you have seen by Primary MD or a Neurologist and advised to do so again. ° °Do not drive when taking Pain medications.  ° ° °Do not take more than prescribed Pain, Sleep and Anxiety Medications ° °Special Instructions: If you have smoked or chewed Tobacco  in the last 2 yrs please stop smoking, stop any  regular Alcohol  and or any Recreational drug use. ° °Wear Seat belts while driving. ° ° °Please note ° °You were cared for by a hospitalist during your hospital stay. If you have any questions about your discharge medications or the care you received while you were in the hospital after you are discharged, you can call the unit and asked to speak with the hospitalist on call if the hospitalist that took care of you is not available. Once you are discharged, your primary care physician will handle any further medical issues. Please note that NO REFILLS for any discharge medications will be authorized once you are discharged, as it is imperative that you return to your primary care physician (or establish a relationship with a primary care physician if you do not have one) for your aftercare needs so that they can reassess your need for medications and monitor your lab values. ° °

## 2014-12-27 NOTE — Telephone Encounter (Signed)
Attempted to call Diane Was told she was currently at lunch.  Will try to call back at a later time.

## 2014-12-27 NOTE — Care Management Note (Signed)
Case Management Note  Patient Details  Name: [name] MRN: [id number] Date of Birth: 02-02-1945  Subjective/Objective:  69 y/o m admitted w/COPD.From home.                  Action/Plan:d/c home no needs or orders.   Expected Discharge Date:   (UNKNOWN)               Expected Discharge Plan:  Home/Self Care  In-House Referral:     Discharge planning Services  CM Consult  Post Acute Care Choice:    Choice offered to:     DME Arranged:    DME Agency:     HH Arranged:    [place] Agency:     Status of Service:  Completed, signed off  Medicare Important Message Given:    Date Medicare IM Given:    Medicare IM give by:    Date Additional Medicare IM Given:    Additional Medicare Important Message give by:     If discussed at [place] of Stay Meetings, dates discussed:    Additional Comments:  Dessa Phi, RN 12/27/2014, 2:25 PM

## 2014-12-27 NOTE — Interval H&P Note (Signed)
Cath Lab Visit (complete for each Cath Lab visit)  Clinical Evaluation Leading to the Procedure:   ACS: Yes.    Non-ACS:    Anginal Classification: CCS IV  Anti-ischemic medical therapy: Minimal Therapy (1 class of medications)  Non-Invasive Test Results: No non-invasive testing performed  Prior CABG: No previous CABG      History and Physical Interval Note:  12/27/2014 8:47 AM  [name]  has presented today for surgery, with the diagnosis of cp  The various methods of treatment have been discussed with the patient and family. After consideration of risks, benefits and other options for treatment, the patient has consented to  Procedure(s): Left Heart Cath and Coronary Angiography (N/A) as a surgical intervention .  The patient's history has been reviewed, patient examined, no change in status, stable for surgery.  I have reviewed the patient's chart and labs.  Questions were answered to the patient's satisfaction.     Debora Stockdale S.

## 2014-12-29 LAB — CULTURE, RESPIRATORY W GRAM STAIN

## 2014-12-29 LAB — CULTURE, RESPIRATORY: CULTURE: NORMAL

## 2014-12-31 DIAGNOSIS — I10 Essential (primary) hypertension: Secondary | ICD-10-CM | POA: Diagnosis not present

## 2014-12-31 DIAGNOSIS — R7301 Impaired fasting glucose: Secondary | ICD-10-CM | POA: Diagnosis not present

## 2014-12-31 DIAGNOSIS — J449 Chronic obstructive pulmonary disease, unspecified: Secondary | ICD-10-CM | POA: Diagnosis not present

## 2014-12-31 DIAGNOSIS — I2510 Atherosclerotic heart disease of native coronary artery without angina pectoris: Secondary | ICD-10-CM | POA: Diagnosis not present

## 2014-12-31 DIAGNOSIS — Z719 Counseling, unspecified: Secondary | ICD-10-CM | POA: Diagnosis not present

## 2015-01-05 ENCOUNTER — Encounter: Payer: Self-pay | Admitting: Cardiology

## 2015-01-05 ENCOUNTER — Ambulatory Visit (INDEPENDENT_AMBULATORY_CARE_PROVIDER_SITE_OTHER): Payer: Commercial Managed Care - HMO | Admitting: Physician Assistant

## 2015-01-05 VITALS — BP 120/70 | HR 89 | Ht 64.0 in | Wt 142.0 lb

## 2015-01-05 DIAGNOSIS — J42 Unspecified chronic bronchitis: Secondary | ICD-10-CM | POA: Diagnosis not present

## 2015-01-05 DIAGNOSIS — I2510 Atherosclerotic heart disease of native coronary artery without angina pectoris: Secondary | ICD-10-CM

## 2015-01-05 MED ORDER — NITROGLYCERIN 0.4 MG SL SUBL: 0.4000 mg | SUBLINGUAL_TABLET | SUBLINGUAL | Status: AC | PRN

## 2015-01-05 MED ORDER — LOSARTAN POTASSIUM 50 MG PO TABS: 50.0000 mg | ORAL_TABLET | Freq: Every day | ORAL | Status: DC

## 2015-01-05 MED ORDER — BISOPROLOL FUMARATE 10 MG PO TABS: 10.0000 mg | ORAL_TABLET | Freq: Every day | ORAL | Status: DC

## 2015-01-05 NOTE — Progress Notes (Addendum)
Cardiology Office Note   Date:  01/05/2015   ID:  [name], [name] [birth year], MRN [id number]  PCP:  [name], MD  Cardiologist:  Dr. [name]   Chief Complaint  Patient presents with  . Follow-up    seen for Dr. [name], post hospital followup  . Coronary Artery Disease  . COPD      History of Present Illness: [name] is a 69 y.o. [name] male who presents for post hospital followup. He has a PMH of severe COPD, CAD s/p BMS to RCA in 2/14 and BMS to proxRCA & OM 6/14, HTN intol to BB due to wheezing, HLD, and GERD. He has a cardiac cath in 7/15 which showed no significant obstructive CAD. He has significant COPD and follows Dr. Lake Bells as outpatient. He was last seen by pulmonology on 11/22/2014 at which time he was having another exacerbation of COPD. He was admitted from 12/15 to 12/19 with COPD exacerbation and chest pain. 2D echo obtained on 12/23/2014 showed EF 55-60%, no RWMA, grade 1 diastolic dysfunction, mildly dilated aortic root. Pulm was also consulted during the admission. He eventually underwent LHC on 12/27/2014 which showed 40% ost LAD, 80% ost D2, 50% mid LCx, patent stent in RCA. Aggressive secondary prevention was recommended. He presents today for post hospital followup. He has been doing ok since admission, he says his breathing is better. He still has chest pressure with SOB and cough. He is on a steroid taper of prednisone. He did have runs of SVT during the hospitalization. It is unclear when he was started on bisoprolol as he was not on bisoprolol prior to admission. Since discharge, he has not noted significant sign of bronchospasm or increasing cough/SOB. He has not seen his pulmonologist since discharge.     Past Medical History  Diagnosis Date  . COPD (chronic obstructive pulmonary disease) ([place])     former smoker  . GERD (gastroesophageal reflux disease)   . CAD (coronary artery disease), native coronary artery 6/11, 2/14, 6/14; 7/15    a.  thought due to vasospasm . cath - CAD LAD 40-50%, LCX 40%, RCA 25% EF 65%. lexiscan myoview EF 68% w/o evidence of ischemia or infarct b. STEMI s/p BMS-mid RCA c. BMS x 2- prox RCA, PLOM  . HTN (hypertension)   . Asthma   . Hyperlipidemia   . Tobacco abuse   . Allergic rhinitis     Past Surgical History  Procedure Laterality Date  . Left foot surgery      repair - and ankle   . Coronary angioplasty with stent placement  02/15/12    20% distal LM, 30-40% mid LAD, 50% ostial diag, 50-60% mid LCx, 99% focal mid RCA stenosis s/p BMS; 20% distal RCA disease, 50% prox PLB; LVEF 55-65%  . Coronary angioplasty with stent placement  07/01/12    30% prox LAD, 30% mid LAD, 20% mid LCx, 90% prox RCA s/p BMS, patent mid RCA stent, 30-40% distal RCA, 80% PLB s/p BMS; EF 55-65%  . Cardiac catheterization  08/03/13    non obstructive disease LCX 20%; LAD 20%; stents in prox RCA and mid are patent, mild in stent stenosis POM, 30%, normal EF 55-65%  . Left heart catheterization with coronary angiogram N/A 02/15/2012    Procedure: LEFT HEART CATHETERIZATION WITH CORONARY ANGIOGRAM;  Surgeon: Peter M [name], MD;  Location: [facility] CATH LAB;  Service: Cardiovascular;  Laterality: N/A;  . Percutaneous coronary stent intervention (pci-s) N/A 02/15/2012  Procedure: PERCUTANEOUS CORONARY STENT INTERVENTION (PCI-S);  Surgeon: Peter M [name], MD;  Location: [facility] CATH LAB;  Service: Cardiovascular;  Laterality: N/A;  . Left heart catheterization with coronary angiogram N/A 07/01/2012    Procedure: LEFT HEART CATHETERIZATION WITH CORONARY ANGIOGRAM;  Surgeon: Peter M [name], MD;  Location: [facility] CATH LAB;  Service: Cardiovascular;  Laterality: N/A;  . Percutaneous coronary stent intervention (pci-s)  07/01/2012    Procedure: PERCUTANEOUS CORONARY STENT INTERVENTION (PCI-S);  Surgeon: Peter M [name], MD;  Location: [facility] CATH LAB;  Service: Cardiovascular;;  . Left heart catheterization with coronary angiogram N/A 08/03/2013    Procedure:  LEFT HEART CATHETERIZATION WITH CORONARY ANGIOGRAM;  Surgeon: Peter M [name], MD;  Location: [facility] CATH LAB;  Service: Cardiovascular;  Laterality: N/A;  . Cardiac catheterization N/A 12/27/2014    Procedure: Left Heart Cath and Coronary Angiography;  Surgeon: Jettie Booze, MD;  Location: [facility] CV LAB;  Service: Cardiovascular;  Laterality: N/A;     Current Outpatient Prescriptions  Medication Sig Dispense Refill  . acetaminophen (TYLENOL) 500 MG tablet Take 500 mg by mouth every 6 (six) hours as needed for mild pain.    [name] albuterol (PROVENTIL HFA) 108 (90 BASE) MCG/ACT inhaler Inhale 2 puffs into the lungs 2 (two) times daily. 1 Inhaler 1  . albuterol (PROVENTIL) (2.5 MG/3ML) 0.083% nebulizer solution USE 1 VIAL IN NEBULIZER TWICE A DAY AS NEEDED 375 mL 1  . amLODipine (NORVASC) 5 MG tablet Take 1 tablet (5 mg total) by mouth daily. 30 tablet 2  . aspirin 81 MG tablet Take 81 mg by mouth daily.     [name] atorvastatin (LIPITOR) 40 MG tablet Take 1 tablet (40 mg total) by mouth daily. 30 tablet 0  . bisoprolol (ZEBETA) 10 MG tablet Take 1 tablet (10 mg total) by mouth daily. 30 tablet 11  . budesonide-formoterol (SYMBICORT) 160-4.5 MCG/ACT inhaler Inhale 2 puffs into the lungs 2 (two) times daily. 1 Inhaler 1  . ezetimibe (ZETIA) 10 MG tablet Take 1 tablet (10 mg total) by mouth daily. 30 tablet 6  . guaiFENesin (MUCINEX) 600 MG 12 hr tablet Take 600 mg by mouth 2 (two) times daily as needed for cough or to loosen phlegm.    [name] losartan (COZAAR) 50 MG tablet Take 1 tablet (50 mg total) by mouth daily. 30 tablet 11  . nitroGLYCERIN (NITROSTAT) 0.4 MG SL tablet Place 1 tablet (0.4 mg total) under the tongue every 5 (five) minutes as needed for chest pain. 25 tablet 3  . omeprazole (PRILOSEC) 20 MG capsule TAKE 1 CAPSULE (20 MG TOTAL) BY MOUTH 2 (TWO) TIMES DAILY BEFORE A MEAL. 60 capsule 4  . pantoprazole (PROTONIX) 40 MG tablet Take 40 mg by mouth daily.    . potassium chloride SA (KLOR-CON  M20) 20 MEQ tablet Take 1 tablet (20 mEq total) by mouth daily. NEEDS TO MAKE APPOINTMENT WITH PRIMARY CARDIOLOGIST FOR FURTHER REFILLS [phone] 30 tablet 0  . predniSONE (DELTASONE) 10 MG tablet Take 40mg  po daily for 3 days, then take 30mg  po daily for 3 days, then take 20mg  po daily for two days, then take 10mg  po daily for 2 days 27 tablet 0  . Spacer/Aero-Holding Chambers (AEROCHAMBER MV) inhaler Use as instructed 1 each 0  . tiotropium (SPIRIVA HANDIHALER) 18 MCG inhalation capsule Place 1 capsule (18 mcg total) into inhaler and inhale daily. 30 capsule 1  . [DISCONTINUED] arformoterol (BROVANA) 15 MCG/2ML NEBU Take 2 mLs (15 mcg total) by nebulization 2 (  two) times daily. Dx code: 496 120 mL 11  . [DISCONTINUED] budesonide (PULMICORT) 0.5 MG/2ML nebulizer solution Take 2 mLs (0.5 mg total) by nebulization 2 (two) times daily. DX:  496 120 mL 11  . [DISCONTINUED] esomeprazole (NEXIUM) 40 MG capsule Take 1 capsule (40 mg total) by mouth daily. 30 capsule 6   No current facility-administered medications for this visit.    Allergies:   Review of patient's allergies indicates no known allergies.    Social History:  The patient  reports that he quit smoking about 6 years ago. His smoking use included Cigarettes. He has a 50 pack-year smoking history. He has never used smokeless tobacco. He reports that he does not drink alcohol or use illicit drugs.   Family History:  The patient's family history is negative for CAD.    ROS:  Please see the history of present illness.   Otherwise, review of systems are positive for wheezing, mild productive cough, CP with cough and chest pressure.   All other systems are reviewed and negative.    PHYSICAL EXAM: VS:  BP 120/70 mmHg  Pulse 89  Ht 5\' 4"  (1.626 m)  Wt 142 lb (64.411 kg)  BMI 24.36 kg/m2  SpO2 95% , BMI Body mass index is 24.36 kg/(m^2). GEN: Well nourished, well developed, in no acute distress HEENT: normal Neck: no JVD, carotid  bruits, or masses Cardiac: RRR; no murmurs, rubs, or gallops,no edema  Respiratory:  Diffusely decreased breath sound, bilateral rhonchi GI: soft, nontender, nondistended, + BS MS: no deformity or atrophy Skin: warm and dry, no rash Neuro:  Strength and sensation are intact Psych: euthymic mood, full affect   EKG:  EKG is not ordered today.    Recent Labs: 12/23/2014: ALT 45; B Natriuretic Peptide 45.4 12/24/2014: Hemoglobin 14.9; Platelets 293 12/27/2014: BUN 32*; Creatinine, Ser 0.93; Magnesium 2.1; Potassium 4.0; Sodium 141    Lipid Panel    Component Value Date/Time   CHOL 172 08/02/2013 0717   TRIG 129 08/02/2013 0717   HDL 52 08/02/2013 0717   CHOLHDL 3.3 08/02/2013 0717   VLDL 26 08/02/2013 0717   LDLCALC 94 08/02/2013 0717   LDLDIRECT 148.5 08/22/2012 0955      Wt Readings from Last 3 Encounters:  01/05/15 142 lb (64.411 kg)  12/27/14 136 lb 14.5 oz (62.1 kg)  11/22/14 138 lb (62.596 kg)      Other studies Reviewed: Additional studies/ records that were reviewed today include:   Recent hospital note  Cardiac cath 12/27/2014 Conclusion     Ost LAD to Prox LAD lesion, 40% stenosed.  Ost 2nd Diag to 2nd Diag lesion, 80% stenosed.  Mid Cx lesion, 50% stenosed.  The left ventricular systolic function is normal.  Patent stents in the RCA.  Mild to moderate left sided disease.  Continue aggressive secondary prevention. He will be watched for bleeding at his radial site. He will then go back to [facility] with further w/u per the internal medicine team.   Echo 12/23/2014 LV EF: 55% -  60%  ------------------------------------------------------------------- Indications:   CHF - 428.0.  ------------------------------------------------------------------- History:  PMH:  Chest pain. Dyspnea. Coronary artery disease. Chronic obstructive pulmonary disease. Risk factors: Current tobacco use.  Dyslipidemia.  ------------------------------------------------------------------- Study Conclusions  - Left ventricle: The cavity size was normal. There was mild concentric hypertrophy. Systolic function was normal. The estimated ejection fraction was in the range of 55% to 60%. Wall motion was normal; there were no regional wall motion abnormalities. Doppler  parameters are consistent with abnormal left ventricular relaxation (grade 1 diastolic dysfunction). - Aortic root: The aortic root was mildly dilated.   Review of the above records demonstrates: Recently admitted with chest pain and COPD exacerbation. Cath showed no significant obstructive CAD except 80% OM2   ASSESSMENT AND PLAN:  1. CAD s/p BMS to RCA in 2/14 and BMS to proxRCA & OM 6/14  - cath 12/27/2014 40% ost LAD, 80% D2, 50% mid LCx, patent stent in RCA  - chest pressure only occur with SOB and cough, likely related to COPD and resp issue. No obvious angina  2. Severe COPD: it appears bisoprolol was added during last hospitalization on discharge for unknown reason. He is currently tolerating it without symptom and sign of bronchospasm, will continue it for now. If has worsening SOB later, low threshold to remove. Can change to low dose diltiazem if SVT becomes more frequent although the SVT likely was the result of COPD exacerbation or Imdur given 80% small D2 residual lesion.   3. HTN intol to BB due to wheezing: low threshold to stop bisoprolol if has increasing SOB.   4. HLD: need Lipid test next visit. Last Lipid test normal in 07/2013. On lipitor  Current medicines are reviewed at length with the patient today.  The patient does not have concerns regarding medicines.  The following changes have been made:  no change  Labs/ tests ordered today include:  No orders of the defined types were placed in this encounter.     Disposition:   FU with Dr. [name] in 3 months. He will need closer followup with his  pulmonologist and PCP.   Hilbert Corrigan PA 01/05/2015 12:15 PM    [facility] [address], [place], [place]  [address] Phone: [phone]; Fax: [phone]

## 2015-01-05 NOTE — Addendum Note (Signed)
Addended byEulas Post, Ariannah Arenson on: 01/05/2015 01:16 PM   Modules accepted: SmartSet

## 2015-01-05 NOTE — Patient Instructions (Addendum)
Medication Instructions:  Your physician recommends that you continue on your current medications as directed. Please refer to the Current Medication list given to you today.  Labwork: NONE  Testing/Procedures: NONE  Follow-Up: Your physician wants you to follow-up in: 3 months with Dr. [name].   Your physician recommends that you schedule a follow-up appointment with Dr. Mayer Masker and primary care doctor.  If you need a refill on your cardiac medications before your next appointment, please call your pharmacy.

## 2015-01-07 LAB — AFB CULTURE WITH SMEAR (NOT AT ARMC)
Acid Fast Culture: NEGATIVE
Acid Fast Smear: NEGATIVE

## 2015-01-14 DIAGNOSIS — J449 Chronic obstructive pulmonary disease, unspecified: Secondary | ICD-10-CM | POA: Diagnosis not present

## 2015-01-14 DIAGNOSIS — I10 Essential (primary) hypertension: Secondary | ICD-10-CM | POA: Diagnosis not present

## 2015-01-14 DIAGNOSIS — E139 Other specified diabetes mellitus without complications: Secondary | ICD-10-CM | POA: Diagnosis not present

## 2015-01-14 DIAGNOSIS — K219 Gastro-esophageal reflux disease without esophagitis: Secondary | ICD-10-CM | POA: Diagnosis not present

## 2015-01-17 ENCOUNTER — Other Ambulatory Visit: Payer: Self-pay | Admitting: *Deleted

## 2015-01-17 MED ORDER — POTASSIUM CHLORIDE CRYS ER 20 MEQ PO TBCR: 20.0000 meq | EXTENDED_RELEASE_TABLET | Freq: Every day | ORAL | Status: DC

## 2015-01-27 ENCOUNTER — Telehealth: Payer: Self-pay | Admitting: Pulmonary Disease

## 2015-01-27 MED ORDER — AZITHROMYCIN 250 MG PO TABS: ORAL_TABLET | ORAL | Status: DC

## 2015-01-27 NOTE — Telephone Encounter (Signed)
Called spoke with pt and is aware of recs. ZPAK sent in. Noting further needed

## 2015-01-27 NOTE — Telephone Encounter (Signed)
Sorry no available appointments. We can send Z pak and encourage lots of fluids  Perhaps his primary doctor's office can see him if needed

## 2015-01-27 NOTE — Telephone Encounter (Signed)
Spoke with pt, c/o increased shortness of breath, chest burning, weakness, prod cough with yellow/white mucus, HA Xseveral days.  Unsure if pt has fever.  Denies chills.  Pt requesting appt tomorrow, I advised that we have no available appts and that I'd have to relay this message to the doctor.  Pt uses [facility] [address] to DOD as BQ is working nights this week.  CY please advise on recs.  Thanks!   No Known Allergies Current Outpatient Prescriptions on File Prior to Visit  Medication Sig Dispense Refill  . acetaminophen (TYLENOL) 500 MG tablet Take 500 mg by mouth every 6 (six) hours as needed for mild pain.    [name] albuterol (PROVENTIL HFA) 108 (90 BASE) MCG/ACT inhaler Inhale 2 puffs into the lungs 2 (two) times daily. 1 Inhaler 1  . albuterol (PROVENTIL) (2.5 MG/3ML) 0.083% nebulizer solution USE 1 VIAL IN NEBULIZER TWICE A DAY AS NEEDED 375 mL 1  . amLODipine (NORVASC) 5 MG tablet Take 1 tablet (5 mg total) by mouth daily. 30 tablet 2  . aspirin 81 MG tablet Take 81 mg by mouth daily.     [name] atorvastatin (LIPITOR) 40 MG tablet Take 1 tablet (40 mg total) by mouth daily. 30 tablet 0  . bisoprolol (ZEBETA) 10 MG tablet Take 1 tablet (10 mg total) by mouth daily. 30 tablet 11  . budesonide-formoterol (SYMBICORT) 160-4.5 MCG/ACT inhaler Inhale 2 puffs into the lungs 2 (two) times daily. 1 Inhaler 1  . ezetimibe (ZETIA) 10 MG tablet Take 1 tablet (10 mg total) by mouth daily. 30 tablet 6  . guaiFENesin (MUCINEX) 600 MG 12 hr tablet Take 600 mg by mouth 2 (two) times daily as needed for cough or to loosen phlegm.    [name] losartan (COZAAR) 50 MG tablet Take 1 tablet (50 mg total) by mouth daily. 30 tablet 11  . nitroGLYCERIN (NITROSTAT) 0.4 MG SL tablet Place 1 tablet (0.4 mg total) under the tongue every 5 (five) minutes as needed for chest pain. 25 tablet 3  . omeprazole (PRILOSEC) 20 MG capsule TAKE 1 CAPSULE (20 MG TOTAL) BY MOUTH 2 (TWO) TIMES DAILY BEFORE A MEAL. 60 capsule 4  .  pantoprazole (PROTONIX) 40 MG tablet Take 40 mg by mouth daily.    . potassium chloride SA (KLOR-CON M20) 20 MEQ tablet Take 1 tablet (20 mEq total) by mouth daily. 30 tablet 3  . predniSONE (DELTASONE) 10 MG tablet Take 40mg  po daily for 3 days, then take 30mg  po daily for 3 days, then take 20mg  po daily for two days, then take 10mg  po daily for 2 days 27 tablet 0  . Spacer/Aero-Holding Chambers (AEROCHAMBER MV) inhaler Use as instructed 1 each 0  . tiotropium (SPIRIVA HANDIHALER) 18 MCG inhalation capsule Place 1 capsule (18 mcg total) into inhaler and inhale daily. 30 capsule 1  . [DISCONTINUED] arformoterol (BROVANA) 15 MCG/2ML NEBU Take 2 mLs (15 mcg total) by nebulization 2 (two) times daily. Dx code: 496 120 mL 11  . [DISCONTINUED] budesonide (PULMICORT) 0.5 MG/2ML nebulizer solution Take 2 mLs (0.5 mg total) by nebulization 2 (two) times daily. DX:  496 120 mL 11  . [DISCONTINUED] esomeprazole (NEXIUM) 40 MG capsule Take 1 capsule (40 mg total) by mouth daily. 30 capsule 6   No current facility-administered medications on file prior to visit.

## 2015-02-11 DIAGNOSIS — E139 Other specified diabetes mellitus without complications: Secondary | ICD-10-CM | POA: Diagnosis not present

## 2015-02-11 DIAGNOSIS — J449 Chronic obstructive pulmonary disease, unspecified: Secondary | ICD-10-CM | POA: Diagnosis not present

## 2015-02-11 DIAGNOSIS — E784 Other hyperlipidemia: Secondary | ICD-10-CM | POA: Diagnosis not present

## 2015-02-11 DIAGNOSIS — I2510 Atherosclerotic heart disease of native coronary artery without angina pectoris: Secondary | ICD-10-CM | POA: Diagnosis not present

## 2015-02-11 DIAGNOSIS — I10 Essential (primary) hypertension: Secondary | ICD-10-CM | POA: Diagnosis not present

## 2015-03-01 ENCOUNTER — Other Ambulatory Visit (INDEPENDENT_AMBULATORY_CARE_PROVIDER_SITE_OTHER): Payer: Commercial Managed Care - HMO

## 2015-03-01 ENCOUNTER — Ambulatory Visit (INDEPENDENT_AMBULATORY_CARE_PROVIDER_SITE_OTHER): Payer: Commercial Managed Care - HMO | Admitting: Pulmonary Disease

## 2015-03-01 ENCOUNTER — Encounter: Payer: Self-pay | Admitting: Pulmonary Disease

## 2015-03-01 VITALS — BP 110/70 | HR 72 | Ht 61.0 in | Wt 139.0 lb

## 2015-03-01 DIAGNOSIS — J441 Chronic obstructive pulmonary disease with (acute) exacerbation: Secondary | ICD-10-CM | POA: Diagnosis not present

## 2015-03-01 DIAGNOSIS — R300 Dysuria: Secondary | ICD-10-CM | POA: Diagnosis not present

## 2015-03-01 LAB — URINALYSIS
BILIRUBIN URINE: NEGATIVE
Hgb urine dipstick: NEGATIVE
KETONES UR: NEGATIVE
Leukocytes, UA: NEGATIVE
Nitrite: NEGATIVE
PH: 6 (ref 5.0–8.0)
SPECIFIC GRAVITY, URINE: 1.01 (ref 1.000–1.030)
Total Protein, Urine: NEGATIVE
URINE GLUCOSE: NEGATIVE
UROBILINOGEN UA: 0.2 (ref 0.0–1.0)

## 2015-03-01 MED ORDER — CIPROFLOXACIN HCL 500 MG PO TABS: 500.0000 mg | ORAL_TABLET | Freq: Two times a day (BID) | ORAL | Status: DC

## 2015-03-01 MED ORDER — PREDNISONE 10 MG PO TABS: ORAL_TABLET | ORAL | Status: DC

## 2015-03-01 NOTE — Progress Notes (Signed)
Subjective:    Patient ID: [name], male    DOB: 05-16-1945, 70 y.o.   MRN: [id number]  Synopsis: Former patient of Dr. Joya Gaskins with COPD COPD GOLD C  2008 Spiro: FeV1 63% 12/11/2011  Spiro:  FeV1 47%  FeV1/FVC 41%    11/2014 Sputum culture: Acinetobacter Iwoffi  HPI Chief Complaint  Patient presents with  . Follow-up    pt c/o chest tightness, shortness of breath, fatigue, sometimes prod cough with white/yellow mucus daily.     Feels heaviness everywhere in arms and legs.  He feels heaviness in his chest which has been present now for 3 months.  He says it hasn't been worse lately but in general more weak.  Every day he is coughing up brown or white mucus.  No fever or chills.  He just feels cold all over.    He has some problems with going to the bathroom, it hurts and burns.  Everytime he goes he has burning.    He take the inhalers twice a day (symbicort) and Tiotropium, without it he has problems breathing.   He was seen today with an interpreter.  Past Medical History  Diagnosis Date  . COPD (chronic obstructive pulmonary disease) ([place])     former smoker  . GERD (gastroesophageal reflux disease)   . CAD (coronary artery disease), native coronary artery 6/11, 2/14, 6/14; 7/15    a. thought due to vasospasm . cath - CAD LAD 40-50%, LCX 40%, RCA 25% EF 65%. lexiscan myoview EF 68% w/o evidence of ischemia or infarct b. STEMI s/p BMS-mid RCA c. BMS x 2- prox RCA, PLOM  . HTN (hypertension)   . Asthma   . Hyperlipidemia   . Tobacco abuse   . Allergic rhinitis       Review of Systems  Constitutional: Negative for fever, chills and fatigue.  HENT: Negative for postnasal drip, rhinorrhea and sinus pressure.   Respiratory: Positive for cough, shortness of breath and wheezing.   Cardiovascular: Negative for chest pain, palpitations and leg swelling.       Objective:   Physical Exam Filed Vitals:   03/01/15 1637  BP: 110/70  Pulse: 72  Height: 5\' 1"  (1.549 m)    Weight: 139 lb (63.05 kg)  SpO2: 97%   RA  Gen: chronically ill appearing HENT: OP clear, TM's clear, neck supple PULM: Wheezing bilaterally, decreased air movement CV: RRR, no mgr, trace edema GI: BS+, soft, nontender Derm: no cyanosis or rash Psyche: normal mood and affect  November 2016 chest x-ray images personally reviewed showing emphysema bilaterally but no infiltrate or lung mass.     November 2016 sputum culture grew out Acinetobacter Iwoffii  Assessment & Plan:  COPD with exacerbation [facility]) He is having another exacerbation of his chronic severe COPD. He is wheezing on exam and he has been producing more mucus recently. He notes compliance with his medications today.  Notably, he recently grew out an Acinetobacter species from his sputum. It is not exactly clear to me how this is contributing to his overall chronic COPD but I'm sure it's not helping.  Plan: Prednisone taper Cipro for 7 days I will consider monthly treatment for this if he has recurrence of symptoms Continue Symbicort and Spiriva (today he notes compliance with medications.) F/u 2 months  Dysuria This is a new problem for him. I worry about a UTI.  Plan: Urinalysis Urine culture He will be treated with Cipro for the acinetobacter in his sputum, we  will follow-up his urine culture which was collected prior to administration of Cipro.     Current outpatient prescriptions:  .  albuterol (PROVENTIL HFA) 108 (90 BASE) MCG/ACT inhaler, Inhale 2 puffs into the lungs 2 (two) times daily., Disp: 1 Inhaler, Rfl: 1 .  amLODipine (NORVASC) 5 MG tablet, Take 1 tablet (5 mg total) by mouth daily., Disp: 30 tablet, Rfl: 2 .  arformoterol (BROVANA) 15 MCG/2ML NEBU, Take 15 mcg by nebulization 2 (two) times daily., Disp: , Rfl:  .  aspirin 81 MG tablet, Take 81 mg by mouth daily. , Disp: , Rfl:  .  bisoprolol (ZEBETA) 10 MG tablet, Take 1 tablet (10 mg total) by mouth daily., Disp: 30 tablet, Rfl: 11 .   budesonide-formoterol (SYMBICORT) 160-4.5 MCG/ACT inhaler, Inhale 2 puffs into the lungs 2 (two) times daily., Disp: 1 Inhaler, Rfl: 1 .  losartan (COZAAR) 50 MG tablet, Take 1 tablet (50 mg total) by mouth daily., Disp: 30 tablet, Rfl: 11 .  metFORMIN (GLUCOPHAGE) 500 MG tablet, Take by mouth 2 (two) times daily with a meal., Disp: , Rfl:  .  nitroGLYCERIN (NITROSTAT) 0.4 MG SL tablet, Place 1 tablet (0.4 mg total) under the tongue every 5 (five) minutes as needed for chest pain., Disp: 25 tablet, Rfl: 3 .  omeprazole (PRILOSEC) 20 MG capsule, TAKE 1 CAPSULE (20 MG TOTAL) BY MOUTH 2 (TWO) TIMES DAILY BEFORE A MEAL., Disp: 60 capsule, Rfl: 4 .  pantoprazole (PROTONIX) 40 MG tablet, Take 40 mg by mouth daily. Takes second pill prn swelling, Disp: , Rfl:  .  potassium chloride SA (KLOR-CON M20) 20 MEQ tablet, Take 1 tablet (20 mEq total) by mouth daily., Disp: 30 tablet, Rfl: 3 .  Spacer/Aero-Holding Chambers (AEROCHAMBER MV) inhaler, Use as instructed, Disp: 1 each, Rfl: 0 .  tiotropium (SPIRIVA HANDIHALER) 18 MCG inhalation capsule, Place 1 capsule (18 mcg total) into inhaler and inhale daily., Disp: 30 capsule, Rfl: 1 .  ciprofloxacin (CIPRO) 500 MG tablet, Take 1 tablet (500 mg total) by mouth 2 (two) times daily., Disp: 14 tablet, Rfl: 0 .  predniSONE (DELTASONE) 10 MG tablet, Take 40mg  po daily for 3 days, then take 30mg  po daily for 3 days, then take 20mg  po daily for two days, then take 10mg  po daily for 2 days, Disp: 27 tablet, Rfl: 0 .  [DISCONTINUED] budesonide (PULMICORT) 0.5 MG/2ML nebulizer solution, Take 2 mLs (0.5 mg total) by nebulization 2 (two) times daily. DX:  496, Disp: 120 mL, Rfl: 11 .  [DISCONTINUED] esomeprazole (NEXIUM) 40 MG capsule, Take 1 capsule (40 mg total) by mouth daily., Disp: 30 capsule, Rfl: 6 '

## 2015-03-01 NOTE — Patient Instructions (Signed)
We will call you with the result of your urine test Take the Cipro and prednisone as prescribed Keep taking the Symbicort twice a day Keep taking the Spiriva once daily We will see you back in 3-4 months or sooner if needed

## 2015-03-01 NOTE — Assessment & Plan Note (Signed)
This is a new problem for him. I worry about a UTI.  Plan: Urinalysis Urine culture He will be treated with Cipro for the acinetobacter in his sputum, we will follow-up his urine culture which was collected prior to administration of Cipro.

## 2015-03-01 NOTE — Assessment & Plan Note (Signed)
He is having another exacerbation of his chronic severe COPD. He is wheezing on exam and he has been producing more mucus recently. He notes compliance with his medications today.  Notably, he recently grew out an Acinetobacter species from his sputum. It is not exactly clear to me how this is contributing to his overall chronic COPD but I'm sure it's not helping.  Plan: Prednisone taper Cipro for 7 days I will consider monthly treatment for this if he has recurrence of symptoms Continue Symbicort and Spiriva (today he notes compliance with medications.) F/u 2 months

## 2015-03-02 LAB — URINE CULTURE
COLONY COUNT: NO GROWTH
ORGANISM ID, BACTERIA: NO GROWTH

## 2015-03-02 LAB — FUNGUS CULTURE W SMEAR

## 2015-03-10 ENCOUNTER — Other Ambulatory Visit: Payer: Self-pay | Admitting: Pulmonary Disease

## 2015-03-10 ENCOUNTER — Other Ambulatory Visit: Payer: Self-pay | Admitting: Cardiology

## 2015-03-14 DIAGNOSIS — I2510 Atherosclerotic heart disease of native coronary artery without angina pectoris: Secondary | ICD-10-CM | POA: Diagnosis not present

## 2015-03-14 DIAGNOSIS — I10 Essential (primary) hypertension: Secondary | ICD-10-CM | POA: Diagnosis not present

## 2015-03-14 DIAGNOSIS — E784 Other hyperlipidemia: Secondary | ICD-10-CM | POA: Diagnosis not present

## 2015-03-14 DIAGNOSIS — J449 Chronic obstructive pulmonary disease, unspecified: Secondary | ICD-10-CM | POA: Diagnosis not present

## 2015-03-14 DIAGNOSIS — E139 Other specified diabetes mellitus without complications: Secondary | ICD-10-CM | POA: Diagnosis not present

## 2015-03-14 DIAGNOSIS — K219 Gastro-esophageal reflux disease without esophagitis: Secondary | ICD-10-CM | POA: Diagnosis not present

## 2015-03-14 DIAGNOSIS — Z719 Counseling, unspecified: Secondary | ICD-10-CM | POA: Diagnosis not present

## 2015-04-08 ENCOUNTER — Ambulatory Visit (INDEPENDENT_AMBULATORY_CARE_PROVIDER_SITE_OTHER): Payer: Commercial Managed Care - HMO | Admitting: Cardiology

## 2015-04-08 ENCOUNTER — Encounter: Payer: Self-pay | Admitting: Cardiology

## 2015-04-08 VITALS — BP 130/74 | HR 78 | Ht 64.0 in | Wt 143.2 lb

## 2015-04-08 DIAGNOSIS — I10 Essential (primary) hypertension: Secondary | ICD-10-CM

## 2015-04-08 DIAGNOSIS — I25119 Atherosclerotic heart disease of native coronary artery with unspecified angina pectoris: Secondary | ICD-10-CM

## 2015-04-08 DIAGNOSIS — I2510 Atherosclerotic heart disease of native coronary artery without angina pectoris: Secondary | ICD-10-CM | POA: Diagnosis not present

## 2015-04-08 DIAGNOSIS — I209 Angina pectoris, unspecified: Secondary | ICD-10-CM | POA: Diagnosis not present

## 2015-04-08 LAB — HEPATIC FUNCTION PANEL
ALT: 39 U/L (ref 9–46)
AST: 20 U/L (ref 10–35)
Albumin: 4 g/dL (ref 3.6–5.1)
Alkaline Phosphatase: 56 U/L (ref 40–115)
Bilirubin, Direct: 0.1 mg/dL (ref <=0.2)
Indirect Bilirubin: 0.3 mg/dL (ref 0.2–1.2)
TOTAL PROTEIN: 5.9 g/dL — AB (ref 6.1–8.1)
Total Bilirubin: 0.4 mg/dL (ref 0.2–1.2)

## 2015-04-08 LAB — BASIC METABOLIC PANEL
BUN: 11 mg/dL (ref 7–25)
CHLORIDE: 107 mmol/L (ref 98–110)
CO2: 27 mmol/L (ref 20–31)
CREATININE: 0.75 mg/dL (ref 0.70–1.18)
Calcium: 8.4 mg/dL — ABNORMAL LOW (ref 8.6–10.3)
GLUCOSE: 141 mg/dL — AB (ref 65–99)
POTASSIUM: 3.6 mmol/L (ref 3.5–5.3)
Sodium: 142 mmol/L (ref 135–146)

## 2015-04-08 LAB — LIPID PANEL
CHOL/HDL RATIO: 4.9 ratio (ref <=5.0)
Cholesterol: 256 mg/dL — ABNORMAL HIGH (ref 125–200)
HDL: 52 mg/dL (ref >=40)
LDL Cholesterol: 161 mg/dL — ABNORMAL HIGH (ref <130)
TRIGLYCERIDES: 213 mg/dL — AB (ref <150)
VLDL: 43 mg/dL — AB (ref <30)

## 2015-04-08 LAB — CK: CK TOTAL: 199 U/L (ref 7–232)

## 2015-04-08 MED ORDER — BISOPROLOL FUMARATE 10 MG PO TABS: 5.0000 mg | ORAL_TABLET | Freq: Every day | ORAL | Status: DC

## 2015-04-08 NOTE — Progress Notes (Signed)
Cardiology Office Note   Date:  04/08/2015   ID:  [name] 1945-02-11, MRN [id number]  PCP:  [name] Fendt, MD  Cardiologist:  Dr. [name]   Chief Complaint  Patient presents with  . Follow-up    pt c/o weakness, sob  . Hyperlipidemia      History of Present Illness: [name] is a 70 y.o. [name] male who presents for follow up CAD. He has a PMH of severe COPD, CAD s/p BMS to RCA in 2/14 and BMS to proxRCA & OM 6/14, HTN intol to BB due to wheezing, HLD, and GERD. He has a cardiac cath in 7/15 which showed no significant obstructive CAD. He has significant COPD and follows Dr. Lake Bells as outpatient. He was last seen by pulmonology on 03/01/15 at which time he was having another exacerbation of COPD. He was treated with steroid taper and Cipro.  2D echo obtained on 12/23/2014 showed EF 55-60%, no RWMA, grade 1 diastolic dysfunction, mildly dilated aortic root. Pulm was also consulted during the admission. He  underwent LHC on 12/27/2014 which showed 40% ost LAD, 80% ost D2, 50% mid LCx, patent stent in RCA. Aggressive secondary prevention was recommended.   On follow up today he reports his breathing is doing better. He complains of feeling weak and his limbs feeling heavy. States abdomen bloats at times. Complains of frequent nocturia. Notes he drinks a lot of water and eats a lot. UA and culture last month were normal. No chest pain.     Past Medical History  Diagnosis Date  . COPD (chronic obstructive pulmonary disease) ([place])     former smoker  . GERD (gastroesophageal reflux disease)   . CAD (coronary artery disease), native coronary artery 6/11, 2/14, 6/14; 7/15    a. thought due to vasospasm . cath - CAD LAD 40-50%, LCX 40%, RCA 25% EF 65%. lexiscan myoview EF 68% w/o evidence of ischemia or infarct b. STEMI s/p BMS-mid RCA c. BMS x 2- prox RCA, PLOM  . HTN (hypertension)   . Asthma   . Hyperlipidemia   . Tobacco abuse   . Allergic rhinitis     Past Surgical  History  Procedure Laterality Date  . Left foot surgery      repair - and ankle   . Coronary angioplasty with stent placement  02/15/12    20% distal LM, 30-40% mid LAD, 50% ostial diag, 50-60% mid LCx, 99% focal mid RCA stenosis s/p BMS; 20% distal RCA disease, 50% prox PLB; LVEF 55-65%  . Coronary angioplasty with stent placement  07/01/12    30% prox LAD, 30% mid LAD, 20% mid LCx, 90% prox RCA s/p BMS, patent mid RCA stent, 30-40% distal RCA, 80% PLB s/p BMS; EF 55-65%  . Cardiac catheterization  08/03/13    non obstructive disease LCX 20%; LAD 20%; stents in prox RCA and mid are patent, mild in stent stenosis POM, 30%, normal EF 55-65%  . Left heart catheterization with coronary angiogram N/A 02/15/2012    Procedure: LEFT HEART CATHETERIZATION WITH CORONARY ANGIOGRAM;  Surgeon: Zelda Reames M [name], MD;  Location: [facility] CATH LAB;  Service: Cardiovascular;  Laterality: N/A;  . Percutaneous coronary stent intervention (pci-s) N/A 02/15/2012    Procedure: PERCUTANEOUS CORONARY STENT INTERVENTION (PCI-S);  Surgeon: Iran Kievit M [name], MD;  Location: [facility] CATH LAB;  Service: Cardiovascular;  Laterality: N/A;  . Left heart catheterization with coronary angiogram N/A 07/01/2012    Procedure: LEFT HEART CATHETERIZATION WITH CORONARY ANGIOGRAM;  Surgeon: Manaal Mandala M [name], MD;  Location: [facility] CATH LAB;  Service: Cardiovascular;  Laterality: N/A;  . Percutaneous coronary stent intervention (pci-s)  07/01/2012    Procedure: PERCUTANEOUS CORONARY STENT INTERVENTION (PCI-S);  Surgeon: Ellisyn Icenhower M [name], MD;  Location: [facility] CATH LAB;  Service: Cardiovascular;;  . Left heart catheterization with coronary angiogram N/A 08/03/2013    Procedure: LEFT HEART CATHETERIZATION WITH CORONARY ANGIOGRAM;  Surgeon: Boston Cookson M [name], MD;  Location: [facility] CATH LAB;  Service: Cardiovascular;  Laterality: N/A;  . Cardiac catheterization N/A 12/27/2014    Procedure: Left Heart Cath and Coronary Angiography;  Surgeon: Jettie Booze, MD;  Location: [place] CV LAB;  Service: Cardiovascular;  Laterality: N/A;     Current Outpatient Prescriptions  Medication Sig Dispense Refill  . albuterol (PROVENTIL HFA) 108 (90 BASE) MCG/ACT inhaler Inhale 2 puffs into the lungs 2 (two) times daily. 1 Inhaler 1  . amLODipine (NORVASC) 5 MG tablet TAKE 1 TABLET (5 MG TOTAL) BY MOUTH DAILY. 30 tablet 2  . arformoterol (BROVANA) 15 MCG/2ML NEBU Take 15 mcg by nebulization 2 (two) times daily.    [name] aspirin 81 MG tablet Take 81 mg by mouth daily.     . bisoprolol (ZEBETA) 10 MG tablet Take 0.5 tablets (5 mg total) by mouth daily. 30 tablet 11  . budesonide-formoterol (SYMBICORT) 160-4.5 MCG/ACT inhaler Inhale 2 puffs into the lungs 2 (two) times daily. 1 Inhaler 1  . ciprofloxacin (CIPRO) 500 MG tablet Take 1 tablet (500 mg total) by mouth 2 (two) times daily. 14 tablet 0  . losartan (COZAAR) 50 MG tablet Take 1 tablet (50 mg total) by mouth daily. 30 tablet 11  . metFORMIN (GLUCOPHAGE) 500 MG tablet Take by mouth 2 (two) times daily with a meal.    . nitroGLYCERIN (NITROSTAT) 0.4 MG SL tablet Place 1 tablet (0.4 mg total) under the tongue every 5 (five) minutes as needed for chest pain. 25 tablet 3  . omeprazole (PRILOSEC) 20 MG capsule TAKE 1 CAPSULE (20 MG TOTAL) BY MOUTH 2 (TWO) TIMES DAILY BEFORE A MEAL. 60 capsule 4  . pantoprazole (PROTONIX) 40 MG tablet Take 40 mg by mouth daily. Takes second pill prn swelling    . potassium chloride SA (KLOR-CON M20) 20 MEQ tablet Take 1 tablet (20 mEq total) by mouth daily. 30 tablet 3  . predniSONE (DELTASONE) 10 MG tablet Take 40mg  po daily for 3 days, then take 30mg  po daily for 3 days, then take 20mg  po daily for two days, then take 10mg  po daily for 2 days 27 tablet 0  . Spacer/Aero-Holding Chambers (AEROCHAMBER MV) inhaler Use as instructed 1 each 0  . tiotropium (SPIRIVA HANDIHALER) 18 MCG inhalation capsule Place 1 capsule (18 mcg total) into inhaler and inhale daily. 30 capsule 1  . [DISCONTINUED]  budesonide (PULMICORT) 0.5 MG/2ML nebulizer solution Take 2 mLs (0.5 mg total) by nebulization 2 (two) times daily. DX:  496 120 mL 11  . [DISCONTINUED] esomeprazole (NEXIUM) 40 MG capsule Take 1 capsule (40 mg total) by mouth daily. 30 capsule 6   No current facility-administered medications for this visit.    Allergies:   Review of patient's allergies indicates no known allergies.    Social History:  The patient  reports that he quit smoking about 7 years ago. His smoking use included Cigarettes. He has a 50 pack-year smoking history. He has never used smokeless tobacco. He reports that he does not drink alcohol or use illicit drugs.  Family History:  The patient's family history is negative for CAD.    ROS:  Please see the history of present illness.   All other systems are reviewed and negative.    PHYSICAL EXAM: VS:  BP 130/74 mmHg  Pulse 78  Ht 5\' 4"  (1.626 m)  Wt 64.955 kg (143 lb 3.2 oz)  BMI 24.57 kg/m2 , BMI Body mass index is 24.57 kg/(m^2). GEN: Well nourished, well developed, in no acute distress HEENT: normal Neck: no JVD, carotid bruits, or masses Cardiac: RRR; no murmurs, rubs, or gallops,no edema  Respiratory:  Diffusely decreased breath sound, few rhonchi, mild bilateral wheezing.  GI: soft, nontender, nondistended, + BS MS: no deformity or atrophy Skin: warm and dry, no rash Neuro:  Strength and sensation are intact Psych: euthymic mood, full affect   EKG:  EKG is not ordered today.    Recent Labs: 12/23/2014: ALT 45; B Natriuretic Peptide 45.4 12/24/2014: Hemoglobin 14.9; Platelets 293 12/27/2014: BUN 32*; Creatinine, Ser 0.93; Magnesium 2.1; Potassium 4.0; Sodium 141    Lipid Panel    Component Value Date/Time   CHOL 172 08/02/2013 0717   TRIG 129 08/02/2013 0717   HDL 52 08/02/2013 0717   CHOLHDL 3.3 08/02/2013 0717   VLDL 26 08/02/2013 0717   LDLCALC 94 08/02/2013 0717   LDLDIRECT 148.5 08/22/2012 0955      Wt Readings from Last 3  Encounters:  04/08/15 64.955 kg (143 lb 3.2 oz)  03/01/15 63.05 kg (139 lb)  01/05/15 64.411 kg (142 lb)      Other studies Reviewed: Additional studies/ records that were reviewed today include:   Cardiac cath 12/27/2014 Conclusion     Ost LAD to Prox LAD lesion, 40% stenosed.  Ost 2nd Diag to 2nd Diag lesion, 80% stenosed.  Mid Cx lesion, 50% stenosed.  The left ventricular systolic function is normal.  Patent stents in the RCA.  Mild to moderate left sided disease.  Continue aggressive secondary prevention. He will be watched for bleeding at his radial site. He will then go back to [facility] with further w/u per the internal medicine team.   Echo 12/23/2014 LV EF: 55% -  60%  ------------------------------------------------------------------- Indications:   CHF - 428.0.  ------------------------------------------------------------------- History:  PMH:  Chest pain. Dyspnea. Coronary artery disease. Chronic obstructive pulmonary disease. Risk factors: Current tobacco use. Dyslipidemia.  ------------------------------------------------------------------- Study Conclusions  - Left ventricle: The cavity size was normal. There was mild concentric hypertrophy. Systolic function was normal. The estimated ejection fraction was in the range of 55% to 60%. Wall motion was normal; there were no regional wall motion abnormalities. Doppler parameters are consistent with abnormal left ventricular relaxation (grade 1 diastolic dysfunction). - Aortic root: The aortic root was mildly dilated.   Review of the above records demonstrates: Recently admitted with chest pain and COPD exacerbation. Cath showed no significant obstructive CAD except 80% OM2   ASSESSMENT AND PLAN:  1. CAD s/p BMS to RCA in 2/14 and BMS to proxRCA & OM 6/14  - cath 12/27/2014 40% ost LAD, 80% D2, 50% mid LCx, patent stent in RCA  - asymptomatic. I am concerned that bisoprolol  may be contributing to COPD symptoms. Will reduce to 5 mg daily.   2. Severe COPD: per pulmonary.  3. HTN intol to BB due to wheezing: low threshold to stop bisoprolol if has increasing SOB.   4. HLD:  On lipitor. Will check chemistries and lipid panel today. Will also check CK given limb weakness  and heaviness.   5. Nocturia. UA and Cx negative last month. Recommend he reduce fluid intake in evening. If this does not improve will need to follow up with primary MD. ? Flomax.   Current medicines are reviewed at length with the patient today.  The patient does not have concerns regarding medicines.  The following changes have been made:  Reduce bisoprolol to 5 mg daily.  Labs/ tests ordered today include:   Orders Placed This Encounter  Procedures  . Basic metabolic panel  . Lipid panel  . Hepatic function panel  . CK (Creatine Kinase)     Disposition:   FU with Dr. [name] in 4  months. He will need closer followup with his pulmonologist and PCP.   Signed, Brytani Voth [name] MD, [facility]    04/08/2015 2:13 PM    [facility]

## 2015-04-08 NOTE — Patient Instructions (Signed)
Reduce bisoprolol (Zbeta) to 5 mg daily  Continue your other therapy  We will check blood work today  I will see you in 6 months

## 2015-04-12 ENCOUNTER — Other Ambulatory Visit: Payer: Self-pay

## 2015-04-12 DIAGNOSIS — E785 Hyperlipidemia, unspecified: Secondary | ICD-10-CM

## 2015-04-12 MED ORDER — ROSUVASTATIN CALCIUM 20 MG PO TABS: ORAL_TABLET | ORAL | Status: DC

## 2015-04-15 DIAGNOSIS — I10 Essential (primary) hypertension: Secondary | ICD-10-CM | POA: Diagnosis not present

## 2015-04-15 DIAGNOSIS — I2510 Atherosclerotic heart disease of native coronary artery without angina pectoris: Secondary | ICD-10-CM | POA: Diagnosis not present

## 2015-04-15 DIAGNOSIS — J449 Chronic obstructive pulmonary disease, unspecified: Secondary | ICD-10-CM | POA: Diagnosis not present

## 2015-04-15 DIAGNOSIS — E139 Other specified diabetes mellitus without complications: Secondary | ICD-10-CM | POA: Diagnosis not present

## 2015-04-22 ENCOUNTER — Other Ambulatory Visit: Payer: Self-pay | Admitting: *Deleted

## 2015-05-17 ENCOUNTER — Other Ambulatory Visit: Payer: Self-pay | Admitting: *Deleted

## 2015-05-17 MED ORDER — POTASSIUM CHLORIDE CRYS ER 20 MEQ PO TBCR: 20.0000 meq | EXTENDED_RELEASE_TABLET | Freq: Every day | ORAL | Status: DC

## 2015-05-27 ENCOUNTER — Other Ambulatory Visit: Payer: Self-pay | Admitting: Cardiology

## 2015-05-30 NOTE — Telephone Encounter (Signed)
Rx(s) sent to pharmacy electronically.  

## 2015-06-01 ENCOUNTER — Encounter (HOSPITAL_COMMUNITY): Payer: Self-pay | Admitting: Emergency Medicine

## 2015-06-01 ENCOUNTER — Inpatient Hospital Stay (HOSPITAL_COMMUNITY)
Admission: EM | Admit: 2015-06-01 | Discharge: 2015-06-05 | DRG: 190 | Disposition: A | Discharge status: Home / Self Care | Payer: Commercial Managed Care - HMO | Attending: Internal Medicine | Admitting: Internal Medicine

## 2015-06-01 ENCOUNTER — Emergency Department (HOSPITAL_COMMUNITY): Payer: Commercial Managed Care - HMO

## 2015-06-01 DIAGNOSIS — E118 Type 2 diabetes mellitus with unspecified complications: Secondary | ICD-10-CM | POA: Diagnosis not present

## 2015-06-01 DIAGNOSIS — I2510 Atherosclerotic heart disease of native coronary artery without angina pectoris: Secondary | ICD-10-CM | POA: Diagnosis present

## 2015-06-01 DIAGNOSIS — E785 Hyperlipidemia, unspecified: Secondary | ICD-10-CM | POA: Diagnosis not present

## 2015-06-01 DIAGNOSIS — Z7984 Long term (current) use of oral hypoglycemic drugs: Secondary | ICD-10-CM

## 2015-06-01 DIAGNOSIS — I739 Peripheral vascular disease, unspecified: Secondary | ICD-10-CM | POA: Diagnosis present

## 2015-06-01 DIAGNOSIS — E1169 Type 2 diabetes mellitus with other specified complication: Secondary | ICD-10-CM | POA: Diagnosis present

## 2015-06-01 DIAGNOSIS — Z87891 Personal history of nicotine dependence: Secondary | ICD-10-CM

## 2015-06-01 DIAGNOSIS — J441 Chronic obstructive pulmonary disease with (acute) exacerbation: Secondary | ICD-10-CM | POA: Diagnosis not present

## 2015-06-01 DIAGNOSIS — Z955 Presence of coronary angioplasty implant and graft: Secondary | ICD-10-CM | POA: Diagnosis not present

## 2015-06-01 DIAGNOSIS — J9622 Acute and chronic respiratory failure with hypercapnia: Secondary | ICD-10-CM | POA: Insufficient documentation

## 2015-06-01 DIAGNOSIS — R0602 Shortness of breath: Secondary | ICD-10-CM | POA: Diagnosis not present

## 2015-06-01 DIAGNOSIS — K5900 Constipation, unspecified: Secondary | ICD-10-CM | POA: Diagnosis present

## 2015-06-01 DIAGNOSIS — D72829 Elevated white blood cell count, unspecified: Secondary | ICD-10-CM | POA: Diagnosis not present

## 2015-06-01 DIAGNOSIS — Z794 Long term (current) use of insulin: Secondary | ICD-10-CM | POA: Diagnosis not present

## 2015-06-01 DIAGNOSIS — I10 Essential (primary) hypertension: Secondary | ICD-10-CM | POA: Diagnosis not present

## 2015-06-01 DIAGNOSIS — K219 Gastro-esophageal reflux disease without esophagitis: Secondary | ICD-10-CM | POA: Diagnosis present

## 2015-06-01 DIAGNOSIS — Z79899 Other long term (current) drug therapy: Secondary | ICD-10-CM

## 2015-06-01 DIAGNOSIS — J45909 Unspecified asthma, uncomplicated: Secondary | ICD-10-CM | POA: Diagnosis not present

## 2015-06-01 DIAGNOSIS — Z7951 Long term (current) use of inhaled steroids: Secondary | ICD-10-CM

## 2015-06-01 DIAGNOSIS — R140 Abdominal distension (gaseous): Secondary | ICD-10-CM | POA: Diagnosis present

## 2015-06-01 DIAGNOSIS — E119 Type 2 diabetes mellitus without complications: Secondary | ICD-10-CM | POA: Diagnosis not present

## 2015-06-01 DIAGNOSIS — R0789 Other chest pain: Secondary | ICD-10-CM | POA: Diagnosis not present

## 2015-06-01 DIAGNOSIS — Z7982 Long term (current) use of aspirin: Secondary | ICD-10-CM | POA: Diagnosis not present

## 2015-06-01 DIAGNOSIS — T380X5A Adverse effect of glucocorticoids and synthetic analogues, initial encounter: Secondary | ICD-10-CM | POA: Diagnosis not present

## 2015-06-01 LAB — CBC WITH DIFFERENTIAL/PLATELET
Basophils Absolute: 0 10*3/uL (ref 0.0–0.1)
Basophils Relative: 0 %
EOS ABS: 0.1 10*3/uL (ref 0.0–0.7)
EOS PCT: 1 %
HCT: 43.1 % (ref 39.0–52.0)
Hemoglobin: 14.3 g/dL (ref 13.0–17.0)
LYMPHS ABS: 1.6 10*3/uL (ref 0.7–4.0)
Lymphocytes Relative: 18 %
MCH: 29.3 pg (ref 26.0–34.0)
MCHC: 33.2 g/dL (ref 30.0–36.0)
MCV: 88.3 fL (ref 78.0–100.0)
Monocytes Absolute: 1.1 10*3/uL — ABNORMAL HIGH (ref 0.1–1.0)
Monocytes Relative: 13 %
Neutro Abs: 5.8 10*3/uL (ref 1.7–7.7)
Neutrophils Relative %: 68 %
Platelets: 278 10*3/uL (ref 150–400)
RBC: 4.88 MIL/uL (ref 4.22–5.81)
RDW: 15.1 % (ref 11.5–15.5)
WBC: 8.5 10*3/uL (ref 4.0–10.5)

## 2015-06-01 LAB — I-STAT ARTERIAL BLOOD GAS, ED
Acid-Base Excess: 3 mmol/L — ABNORMAL HIGH (ref 0.0–2.0)
BICARBONATE: 29.2 meq/L — AB (ref 20.0–24.0)
O2 Saturation: 100 %
PCO2 ART: 48.9 mmHg — AB (ref 35.0–45.0)
Patient temperature: 98.2
TCO2: 31 mmol/L (ref 0–100)
pH, Arterial: 7.383 (ref 7.350–7.450)
pO2, Arterial: 217 mmHg — ABNORMAL HIGH (ref 80.0–100.0)

## 2015-06-01 LAB — BASIC METABOLIC PANEL
Anion gap: 6 (ref 5–15)
BUN: 15 mg/dL (ref 6–20)
CHLORIDE: 107 mmol/L (ref 101–111)
CO2: 26 mmol/L (ref 22–32)
CREATININE: 0.75 mg/dL (ref 0.61–1.24)
Calcium: 8.8 mg/dL — ABNORMAL LOW (ref 8.9–10.3)
GFR calc Af Amer: >60 mL/min (ref >60)
Glucose, Bld: 120 mg/dL — ABNORMAL HIGH (ref 65–99)
Potassium: 3.5 mmol/L (ref 3.5–5.1)
SODIUM: 139 mmol/L (ref 135–145)

## 2015-06-01 LAB — GLUCOSE, CAPILLARY
GLUCOSE-CAPILLARY: 276 mg/dL — AB (ref 65–99)
Glucose-Capillary: 184 mg/dL — ABNORMAL HIGH (ref 65–99)

## 2015-06-01 LAB — I-STAT TROPONIN, ED: TROPONIN I, POC: 0 ng/mL (ref 0.00–0.08)

## 2015-06-01 LAB — TROPONIN I: Troponin I: <0.03 ng/mL (ref <0.031)

## 2015-06-01 MED ORDER — TRAZODONE HCL 50 MG PO TABS
25.0000 mg | ORAL_TABLET | Freq: Every evening | ORAL | Status: DC | PRN
  Administered 2015-06-02: 25 mg via ORAL
  Filled 2015-06-01: qty 1

## 2015-06-01 MED ORDER — LOSARTAN POTASSIUM 50 MG PO TABS
50.0000 mg | ORAL_TABLET | Freq: Every day | ORAL | Status: DC
  Administered 2015-06-01 – 2015-06-05 (×5): 50 mg via ORAL
  Filled 2015-06-01 (×5): qty 1

## 2015-06-01 MED ORDER — PANTOPRAZOLE SODIUM 40 MG PO TBEC
40.0000 mg | DELAYED_RELEASE_TABLET | Freq: Every day | ORAL | Status: DC
  Administered 2015-06-01 – 2015-06-05 (×5): 40 mg via ORAL
  Filled 2015-06-01 (×5): qty 1

## 2015-06-01 MED ORDER — HYDROCODONE-ACETAMINOPHEN 5-325 MG PO TABS: 1.0000 | ORAL_TABLET | ORAL | Status: DC | PRN

## 2015-06-01 MED ORDER — INSULIN ASPART 100 UNIT/ML ~~LOC~~ SOLN
0.0000 [IU] | Freq: Three times a day (TID) | SUBCUTANEOUS | Status: DC
Start: 2015-06-01 — End: 2015-06-03
  Administered 2015-06-01: 5 [IU] via SUBCUTANEOUS
  Administered 2015-06-02: 2 [IU] via SUBCUTANEOUS
  Administered 2015-06-02: 1 [IU] via SUBCUTANEOUS
  Administered 2015-06-02: 3 [IU] via SUBCUTANEOUS
  Administered 2015-06-03: 2 [IU] via SUBCUTANEOUS

## 2015-06-01 MED ORDER — ASPIRIN EC 81 MG PO TBEC
81.0000 mg | DELAYED_RELEASE_TABLET | Freq: Every day | ORAL | Status: DC
  Administered 2015-06-01 – 2015-06-05 (×5): 81 mg via ORAL
  Filled 2015-06-01 (×5): qty 1

## 2015-06-01 MED ORDER — POLYETHYLENE GLYCOL 3350 17 G PO PACK
17.0000 g | PACK | Freq: Every day | ORAL | Status: DC | PRN
  Administered 2015-06-03: 17 g via ORAL
  Filled 2015-06-01: qty 1

## 2015-06-01 MED ORDER — LEVOFLOXACIN IN D5W 750 MG/150ML IV SOLN: 750.0000 mg | INTRAVENOUS | Status: DC

## 2015-06-01 MED ORDER — GUAIFENESIN ER 600 MG PO TB12
1200.0000 mg | ORAL_TABLET | Freq: Two times a day (BID) | ORAL | Status: DC
  Administered 2015-06-01 – 2015-06-05 (×8): 1200 mg via ORAL
  Filled 2015-06-01 (×10): qty 2

## 2015-06-01 MED ORDER — LORAZEPAM 2 MG/ML IJ SOLN
0.5000 mg | INTRAMUSCULAR | Status: DC | PRN
  Administered 2015-06-01: 0.5 mg via INTRAVENOUS
  Filled 2015-06-01: qty 1

## 2015-06-01 MED ORDER — MAGNESIUM SULFATE 2 GM/50ML IV SOLN
2.0000 g | Freq: Once | INTRAVENOUS | Status: AC
  Administered 2015-06-01: 2 g via INTRAVENOUS
  Filled 2015-06-01: qty 50

## 2015-06-01 MED ORDER — METHYLPREDNISOLONE SODIUM SUCC 125 MG IJ SOLR
125.0000 mg | Freq: Once | INTRAMUSCULAR | Status: AC
  Administered 2015-06-01: 125 mg via INTRAVENOUS
  Filled 2015-06-01: qty 2

## 2015-06-01 MED ORDER — POTASSIUM CHLORIDE CRYS ER 20 MEQ PO TBCR
20.0000 meq | EXTENDED_RELEASE_TABLET | Freq: Every day | ORAL | Status: DC
  Administered 2015-06-02 – 2015-06-05 (×4): 20 meq via ORAL
  Filled 2015-06-01 (×4): qty 1

## 2015-06-01 MED ORDER — IPRATROPIUM-ALBUTEROL 0.5-2.5 (3) MG/3ML IN SOLN
3.0000 mL | Freq: Four times a day (QID) | RESPIRATORY_TRACT | Status: DC
  Administered 2015-06-01 – 2015-06-02 (×2): 3 mL via RESPIRATORY_TRACT
  Filled 2015-06-01: qty 3

## 2015-06-01 MED ORDER — NITROGLYCERIN 0.4 MG SL SUBL
0.4000 mg | SUBLINGUAL_TABLET | SUBLINGUAL | Status: DC | PRN
  Administered 2015-06-03: 0.4 mg via SUBLINGUAL
  Filled 2015-06-01: qty 1

## 2015-06-01 MED ORDER — METHYLPREDNISOLONE SODIUM SUCC 125 MG IJ SOLR
60.0000 mg | Freq: Four times a day (QID) | INTRAMUSCULAR | Status: DC
  Administered 2015-06-01 – 2015-06-03 (×6): 60 mg via INTRAVENOUS
  Filled 2015-06-01 (×6): qty 2

## 2015-06-01 MED ORDER — ROSUVASTATIN CALCIUM 20 MG PO TABS
20.0000 mg | ORAL_TABLET | Freq: Every day | ORAL | Status: DC
Start: 2015-06-01 — End: 2015-06-05
  Administered 2015-06-01 – 2015-06-04 (×4): 20 mg via ORAL
  Filled 2015-06-01 (×4): qty 1

## 2015-06-01 MED ORDER — BISOPROLOL FUMARATE 5 MG PO TABS
5.0000 mg | ORAL_TABLET | Freq: Every day | ORAL | Status: DC
  Administered 2015-06-01 – 2015-06-05 (×5): 5 mg via ORAL
  Filled 2015-06-01 (×6): qty 1

## 2015-06-01 MED ORDER — ONDANSETRON HCL 4 MG PO TABS: 4.0000 mg | ORAL_TABLET | Freq: Four times a day (QID) | ORAL | Status: DC | PRN

## 2015-06-01 MED ORDER — ALBUTEROL (5 MG/ML) CONTINUOUS INHALATION SOLN
10.0000 mg/h | INHALATION_SOLUTION | Freq: Once | RESPIRATORY_TRACT | Status: AC
  Administered 2015-06-01: 10 mg/h via RESPIRATORY_TRACT
  Filled 2015-06-01: qty 20

## 2015-06-01 MED ORDER — LEVOFLOXACIN IN D5W 750 MG/150ML IV SOLN
750.0000 mg | INTRAVENOUS | Status: DC
  Administered 2015-06-01 – 2015-06-02 (×2): 750 mg via INTRAVENOUS
  Filled 2015-06-01 (×3): qty 150

## 2015-06-01 MED ORDER — ALBUTEROL (5 MG/ML) CONTINUOUS INHALATION SOLN
10.0000 mg/h | INHALATION_SOLUTION | Freq: Once | RESPIRATORY_TRACT | Status: AC
  Administered 2015-06-01: 10 mg/h via RESPIRATORY_TRACT

## 2015-06-01 MED ORDER — ACETAMINOPHEN 325 MG PO TABS: 650.0000 mg | ORAL_TABLET | Freq: Four times a day (QID) | ORAL | Status: DC | PRN

## 2015-06-01 MED ORDER — ACETAMINOPHEN 650 MG RE SUPP: 650.0000 mg | Freq: Four times a day (QID) | RECTAL | Status: DC | PRN

## 2015-06-01 MED ORDER — IPRATROPIUM-ALBUTEROL 0.5-2.5 (3) MG/3ML IN SOLN: 3.0000 mL | RESPIRATORY_TRACT | Status: DC | PRN

## 2015-06-01 MED ORDER — AMLODIPINE BESYLATE 5 MG PO TABS
5.0000 mg | ORAL_TABLET | Freq: Every day | ORAL | Status: DC
  Administered 2015-06-01 – 2015-06-05 (×5): 5 mg via ORAL
  Filled 2015-06-01 (×5): qty 1

## 2015-06-01 MED ORDER — IPRATROPIUM-ALBUTEROL 0.5-2.5 (3) MG/3ML IN SOLN: 3.0000 mL | Freq: Four times a day (QID) | RESPIRATORY_TRACT | Status: DC

## 2015-06-01 MED ORDER — ONDANSETRON HCL 4 MG/2ML IJ SOLN: 4.0000 mg | Freq: Four times a day (QID) | INTRAMUSCULAR | Status: DC | PRN

## 2015-06-01 MED ORDER — ENOXAPARIN SODIUM 40 MG/0.4ML ~~LOC~~ SOLN
40.0000 mg | SUBCUTANEOUS | Status: DC
  Administered 2015-06-01 – 2015-06-04 (×4): 40 mg via SUBCUTANEOUS
  Filled 2015-06-01 (×4): qty 0.4

## 2015-06-01 MED ORDER — BISACODYL 5 MG PO TBEC: 5.0000 mg | DELAYED_RELEASE_TABLET | Freq: Every day | ORAL | Status: DC | PRN

## 2015-06-01 NOTE — ED Notes (Signed)
PT arrives from home with complaints of SOB and chest pain. PT reports both are chronic. PT reports chest pain is intermittent. PT does not report chest pain during triage. PT audibly wheezing. PT had 5mg  of albuterol in route.

## 2015-06-01 NOTE — Progress Notes (Signed)
Marwan Kissell [id number] Admission Data: 06/01/2015 6:49 PM Attending Provider: Waldemar Dickens, MD  [id number] Bunnie Domino, MD Consults/ Treatment Team:    Chin Cermeno is a 70 y.o. male patient admitted from ED awake, alert  & orientated  X 3,  Full Code, VSS - Blood pressure 144/76, pulse 87, temperature 98.2 F (36.8 C), temperature source Oral, resp. rate 19, height 5\' 2"  (1.575 m), weight 63.504 kg (140 lb), SpO2 94 %., room air, no c/o shortness of breath, no c/o chest pain, no distress noted. .   IV site WDL: Left Hand with a transparent dsg that's clean dry and intact.  Allergies:  No Known Allergies   Past Medical History  Diagnosis Date  . COPD (chronic obstructive pulmonary disease) ([place])     former smoker  . GERD (gastroesophageal reflux disease)   . CAD (coronary artery disease), native coronary artery 6/11, 2/14, 6/14; 7/15    a. thought due to vasospasm . cath - CAD LAD 40-50%, LCX 40%, RCA 25% EF 65%. lexiscan myoview EF 68% w/o evidence of ischemia or infarct b. STEMI s/p BMS-mid RCA c. BMS x 2- prox RCA, PLOM  . HTN (hypertension)   . Asthma   . Hyperlipidemia   . Tobacco abuse   . Allergic rhinitis   . Shortness of breath dyspnea   . Diabetes mellitus without complication ([place])     type 2   Pt orientation to unit, room and routine.  Admission INP armband ID verified with patient and in place. SR up x 2, fall risk assessment complete with Patient verbalizing understanding of risks associated with falls. Pt verbalizes an understanding of how to use the call bell and to call for help before getting out of bed.  Skin, clean-dry- intact without evidence of skin break down. Bruising noted to BUE. Skin exam done with Ginger RN Will cont to monitor and assist as needed.  Dorita Fray, RN 06/01/2015 1530

## 2015-06-01 NOTE — ED Provider Notes (Signed)
CSN: [id number]     Arrival date & time 06/01/15  0830 History   First MD Initiated Contact with Patient 06/01/15 [phone]     Chief Complaint  Patient presents with  . Shortness of Breath  . Chest Pain      HPI  Patient presents for evaluation of difficulty breathing. He has a history of COPD. Follows with pulmonary. Chronic shortness of breath. More dyspneic over last 2-3 days. Some productive sputum. No hemoptysis. No chest pain. Given albuterol in route was states he's "not any better".  History of coronary artery disease, COPD, GE reflux, hypertension, continues to smoke.  Past Medical History  Diagnosis Date  . COPD (chronic obstructive pulmonary disease) ([place])     former smoker  . GERD (gastroesophageal reflux disease)   . CAD (coronary artery disease), native coronary artery 6/11, 2/14, 6/14; 7/15    a. thought due to vasospasm . cath - CAD LAD 40-50%, LCX 40%, RCA 25% EF 65%. lexiscan myoview EF 68% w/o evidence of ischemia or infarct b. STEMI s/p BMS-mid RCA c. BMS x 2- prox RCA, PLOM  . HTN (hypertension)   . Asthma   . Hyperlipidemia   . Tobacco abuse   . Allergic rhinitis    Past Surgical History  Procedure Laterality Date  . Left foot surgery      repair - and ankle   . Coronary angioplasty with stent placement  02/15/12    20% distal LM, 30-40% mid LAD, 50% ostial diag, 50-60% mid LCx, 99% focal mid RCA stenosis s/p BMS; 20% distal RCA disease, 50% prox PLB; LVEF 55-65%  . Coronary angioplasty with stent placement  07/01/12    30% prox LAD, 30% mid LAD, 20% mid LCx, 90% prox RCA s/p BMS, patent mid RCA stent, 30-40% distal RCA, 80% PLB s/p BMS; EF 55-65%  . Cardiac catheterization  08/03/13    non obstructive disease LCX 20%; LAD 20%; stents in prox RCA and mid are patent, mild in stent stenosis POM, 30%, normal EF 55-65%  . Left heart catheterization with coronary angiogram N/A 02/15/2012    Procedure: LEFT HEART CATHETERIZATION WITH CORONARY ANGIOGRAM;  Surgeon: Peter M  [name], MD;  Location: [facility] CATH LAB;  Service: Cardiovascular;  Laterality: N/A;  . Percutaneous coronary stent intervention (pci-s) N/A 02/15/2012    Procedure: PERCUTANEOUS CORONARY STENT INTERVENTION (PCI-S);  Surgeon: Peter M [name], MD;  Location: [facility] CATH LAB;  Service: Cardiovascular;  Laterality: N/A;  . Left heart catheterization with coronary angiogram N/A 07/01/2012    Procedure: LEFT HEART CATHETERIZATION WITH CORONARY ANGIOGRAM;  Surgeon: Peter M [name], MD;  Location: [facility] CATH LAB;  Service: Cardiovascular;  Laterality: N/A;  . Percutaneous coronary stent intervention (pci-s)  07/01/2012    Procedure: PERCUTANEOUS CORONARY STENT INTERVENTION (PCI-S);  Surgeon: Peter M [name], MD;  Location: [facility] CATH LAB;  Service: Cardiovascular;;  . Left heart catheterization with coronary angiogram N/A 08/03/2013    Procedure: LEFT HEART CATHETERIZATION WITH CORONARY ANGIOGRAM;  Surgeon: Peter M [name], MD;  Location: [facility] CATH LAB;  Service: Cardiovascular;  Laterality: N/A;  . Cardiac catheterization N/A 12/27/2014    Procedure: Left Heart Cath and Coronary Angiography;  Surgeon: Jettie Booze, MD;  Location: [place] CV LAB;  Service: Cardiovascular;  Laterality: N/A;   Family History  Problem Relation Age of Onset  . CAD Neg Hx     no known FHx of early CAD   Social History  Substance Use Topics  . Smoking status: Former  Smoker -- 1.00 packs/day for 50 years    Types: Cigarettes    Quit date: 01/09/2008  . Smokeless tobacco: Never Used     Comment: rolled own cigarettes since age of 70,   . Alcohol Use: No     Comment: occassional     Review of Systems  Constitutional: Negative for fever, chills, diaphoresis, appetite change and fatigue.  HENT: Negative for mouth sores, sore throat and trouble swallowing.   Eyes: Negative for visual disturbance.  Respiratory: Positive for shortness of breath and wheezing. Negative for cough and chest tightness.   Cardiovascular: Negative for chest  pain.  Gastrointestinal: Negative for nausea, vomiting, abdominal pain, diarrhea and abdominal distention.  Endocrine: Negative for polydipsia, polyphagia and polyuria.  Genitourinary: Negative for dysuria, frequency and hematuria.  Musculoskeletal: Negative for gait problem.  Skin: Negative for color change, pallor and rash.  Neurological: Negative for dizziness, syncope, light-headedness and headaches.  Hematological: Does not bruise/bleed easily.  Psychiatric/Behavioral: Negative for behavioral problems and confusion.      Allergies  Review of patient's allergies indicates no known allergies.  Home Medications   Prior to Admission medications   Medication Sig Start Date End Date Taking? Authorizing Provider  albuterol (PROVENTIL HFA) 108 (90 BASE) MCG/ACT inhaler Inhale 2 puffs into the lungs 2 (two) times daily. 05/06/14  Yes Robbie Lis, MD  amLODipine (NORVASC) 5 MG tablet TAKE 1 TABLET (5 MG TOTAL) BY MOUTH DAILY. 03/10/15  Yes Peter M [name], MD  aspirin 81 MG tablet Take 81 mg by mouth daily.    Yes Historical Provider, MD  bisoprolol (ZEBETA) 10 MG tablet Take 0.5 tablets (5 mg total) by mouth daily. 04/08/15  Yes Peter M [name], MD  budesonide-formoterol [facility]) 160-4.5 MCG/ACT inhaler Inhale 2 puffs into the lungs 2 (two) times daily. 05/06/14  Yes Robbie Lis, MD  losartan (COZAAR) 50 MG tablet Take 1 tablet (50 mg total) by mouth daily. 01/05/15  Yes [name], PA  metFORMIN (GLUCOPHAGE) 500 MG tablet Take by mouth 2 (two) times daily with a meal.   Yes Historical Provider, MD  pantoprazole (PROTONIX) 40 MG tablet Take 40 mg by mouth daily. 05/23/15  Yes Historical Provider, MD  potassium chloride SA (KLOR-CON M20) 20 MEQ tablet Take 1 tablet (20 mEq total) by mouth daily. 05/17/15  Yes Peter M [name], MD  rosuvastatin (CRESTOR) 20 MG tablet Take 20 mg daily for elevated cholesterol 04/12/15  Yes Peter M [name], MD  tiotropium (SPIRIVA HANDIHALER) 18 MCG inhalation capsule  Place 1 capsule (18 mcg total) into inhaler and inhale daily. 05/06/14  Yes Robbie Lis, MD  nitroGLYCERIN (NITROSTAT) 0.4 MG SL tablet Place 1 tablet (0.4 mg total) under the tongue every 5 (five) minutes as needed for chest pain. 01/05/15   [name], PA  omeprazole (PRILOSEC) 20 MG capsule TAKE 1 CAPSULE (20 MG TOTAL) BY MOUTH 2 (TWO) TIMES DAILY BEFORE A MEAL. Patient not taking: Reported on 06/01/2015 05/30/15   Peter M [name], MD  Spacer/Aero-Holding Chambers (AEROCHAMBER MV) inhaler Use as instructed 11/22/14   Juanito Doom, MD   BP 148/81 mmHg  Pulse 95  Temp(Src) 98.2 F (36.8 C) (Oral)  Resp 24  Ht 5\' 2"  (1.575 m)  Wt 140 lb (63.504 kg)  BMI 25.60 kg/m2  SpO2 96% Physical Exam  Constitutional: He is oriented to person, place, and time. He appears well-developed and well-nourished. No distress.  HENT:  Head: Normocephalic.  Eyes: Conjunctivae are normal. Pupils  are equal, round, and reactive to light. No scleral icterus.  Neck: Normal range of motion. Neck supple. No thyromegaly present.  Cardiovascular: Normal rate and regular rhythm.  Exam reveals no gallop and no friction rub.   No murmur heard. Pulmonary/Chest: Effort normal. No respiratory distress. He has decreased breath sounds in the right upper field, the right middle field, the right lower field, the left upper field, the left middle field and the left lower field. He has wheezes in the right upper field, the right middle field, the right lower field, the left upper field, the left middle field and the left lower field. He has no rales.  Prolongation in all fields. Wheezing all fields globally diminished breath sounds. Dyspnea with conversation. No accessory muscle use. Does not appear fatigued. Mentating well.  Abdominal: Soft. Bowel sounds are normal. He exhibits no distension. There is no tenderness. There is no rebound.  Musculoskeletal: Normal range of motion.  Neurological: He is alert and oriented to person,  place, and time.  Skin: Skin is warm and dry. No rash noted.  Psychiatric: He has a normal mood and affect. His behavior is normal.    ED Course  Procedures (including critical care time) Labs Review Labs Reviewed  CBC WITH DIFFERENTIAL/PLATELET - Abnormal; Notable for the following:    Monocytes Absolute 1.1 (*)    All other components within normal limits  BASIC METABOLIC PANEL - Abnormal; Notable for the following:    Glucose, Bld 120 (*)    Calcium 8.8 (*)    All other components within normal limits  I-STAT ARTERIAL BLOOD GAS, ED - Abnormal; Notable for the following:    pCO2 arterial 48.9 (*)    pO2, Arterial 217.0 (*)    Bicarbonate 29.2 (*)    Acid-Base Excess 3.0 (*)    All other components within normal limits  BLOOD GAS, ARTERIAL  I-STAT TROPOININ, ED    Imaging Review Dg Chest Port 1 View  06/01/2015  CLINICAL DATA:  Shortness of Breath EXAM: PORTABLE CHEST 1 VIEW COMPARISON:  12/23/2014 FINDINGS: Cardiomediastinal silhouette is stable. No acute infiltrate or pleural effusion. No pulmonary edema. There is linear atelectasis or scarring in left upper lobe. Degenerative changes right AC joint. IMPRESSION: No infiltrate or pulmonary edema. Linear atelectasis or scarring left upper lobe. Electronically Signed   By: Lahoma Crocker M.D.   On: 06/01/2015 09:34   I have personally reviewed and evaluated these images and lab results as part of my medical decision-making.   EKG Interpretation   Date/Time:  Wednesday Jun 01 2015 08:50:03 EDT Ventricular Rate:  84 PR Interval:  136 QRS Duration: 87 QT Interval:  362 QTC Calculation: 428 R Axis:   73 Text Interpretation:  Sinus rhythm Confirmed by Jeneen Rinks  MD, [place] ([id number]) on  06/01/2015 9:01:34 AM      MDM   Final diagnoses:  COPD exacerbation ([place])    Patient received nebulized up-year-old treatment prior to arrival by EMS. Received one hour of continuous nebulized albuterol in route. He complained of continued dyspnea.  Has continued prolongation. His respiratory and expiratory wheezing. He was given Ativan for dyspnea. ABG shows no significant retention of CO2 arrest for a failure. He is not hypoxemic. He continues markedly symptomatic even sitting at rest in bed he is dyspneic with conversation. Care discussed with tried hospitalist team. Patient will be admitted. Observation bed requested.    Tanna Furry, MD 06/01/15 1331

## 2015-06-01 NOTE — ED Notes (Signed)
Attempted report to 5W. 

## 2015-06-01 NOTE — ED Notes (Signed)
Pt presents with shortness of breath with chest pain x 2 days.  Per EMS, pt has auditory wheezing with neb treatment given PTA.  NSR noted, PIV placed to L hand (20g), BP: 160/82, HR: 90, RR: 2-, 98% on RA; CBG: 112

## 2015-06-01 NOTE — H&P (Signed)
History and Physical    [name] [id number] DOB: 05/18/1945 DOA: 06/01/2015  PCP: [name], MD  Patient coming from:  Home  Chief Complaint:  sob  HPI: [name] is a 70 y.o. male with medical history significant for, but not necessarily limited to,  coronary artery disease s/p BMS 2014, hypertension, and COPD. Patient brought to ED by EMS for SOB / wheezing and intermittent chest pain. He was given ativan and continuous albuterol nebulizer in route to ED without improvement. Doesn't wear 02 at home but always short of breath to some degree. Complaint with inhalers at home, not smoking. Over last two days SOB has been much worse. No orthopnea. Occas cough with sputum production. started two days ago.   ED Course:  Continuous albuterol treatment, IV ativan  Review of Systems: bloating after meals, constipation. As per HPI, otherwise 10 point review of systems negative.    Past Medical History  Diagnosis Date  . COPD (chronic obstructive pulmonary disease) ([place])     former smoker  . GERD (gastroesophageal reflux disease)   . CAD (coronary artery disease), native coronary artery 6/11, 2/14, 6/14; 7/15    a. thought due to vasospasm . cath - CAD LAD 40-50%, LCX 40%, RCA 25% EF 65%. lexiscan myoview EF 68% w/o evidence of ischemia or infarct b. STEMI s/p BMS-mid RCA c. BMS x 2- prox RCA, PLOM  . HTN (hypertension)   . Asthma   . Hyperlipidemia   . Tobacco abuse   . Allergic rhinitis     Past Surgical History  Procedure Laterality Date  . Left foot surgery      repair - and ankle   . Coronary angioplasty with stent placement  02/15/12    20% distal LM, 30-40% mid LAD, 50% ostial diag, 50-60% mid LCx, 99% focal mid RCA stenosis s/p BMS; 20% distal RCA disease, 50% prox PLB; LVEF 55-65%  . Coronary angioplasty with stent placement  07/01/12    30% prox LAD, 30% mid LAD, 20% mid LCx, 90% prox RCA s/p BMS, patent mid RCA stent, 30-40% distal RCA, 80% PLB s/p BMS; EF 55-65%  .  Cardiac catheterization  08/03/13    non obstructive disease LCX 20%; LAD 20%; stents in prox RCA and mid are patent, mild in stent stenosis POM, 30%, normal EF 55-65%  . Left heart catheterization with coronary angiogram N/A 02/15/2012    Procedure: LEFT HEART CATHETERIZATION WITH CORONARY ANGIOGRAM;  Surgeon: Peter M [name], MD;  Location: [facility] CATH LAB;  Service: Cardiovascular;  Laterality: N/A;  . Percutaneous coronary stent intervention (pci-s) N/A 02/15/2012    Procedure: PERCUTANEOUS CORONARY STENT INTERVENTION (PCI-S);  Surgeon: Peter M [name], MD;  Location: [facility] CATH LAB;  Service: Cardiovascular;  Laterality: N/A;  . Left heart catheterization with coronary angiogram N/A 07/01/2012    Procedure: LEFT HEART CATHETERIZATION WITH CORONARY ANGIOGRAM;  Surgeon: Peter M [name], MD;  Location: [facility] CATH LAB;  Service: Cardiovascular;  Laterality: N/A;  . Percutaneous coronary stent intervention (pci-s)  07/01/2012    Procedure: PERCUTANEOUS CORONARY STENT INTERVENTION (PCI-S);  Surgeon: Peter M [name], MD;  Location: [facility] CATH LAB;  Service: Cardiovascular;;  . Left heart catheterization with coronary angiogram N/A 08/03/2013    Procedure: LEFT HEART CATHETERIZATION WITH CORONARY ANGIOGRAM;  Surgeon: Peter M [name], MD;  Location: [facility] CATH LAB;  Service: Cardiovascular;  Laterality: N/A;  . Cardiac catheterization N/A 12/27/2014    Procedure: Left Heart Cath and Coronary Angiography;  Surgeon: Jettie Booze,  MD;  Location: [place] CV LAB;  Service: Cardiovascular;  Laterality: N/A;   Lives at home with wife. No assistive devices needed for ambulation though complains of feeling " too heavy to walk".    reports that he quit smoking about 7 years ago. His smoking use included Cigarettes. He has a 50 pack-year smoking history. He has never used smokeless tobacco. He reports that he does not drink alcohol or use illicit drugs.  No Known Allergies  Family History  Problem Relation Age of Onset  . CAD  Neg Hx     no known FHx of early CAD   . Prior to Admission medications   Medication Sig Start Date End Date Taking? Authorizing Provider  albuterol (PROVENTIL HFA) 108 (90 BASE) MCG/ACT inhaler Inhale 2 puffs into the lungs 2 (two) times daily. 05/06/14  Yes Robbie Lis, MD  amLODipine (NORVASC) 5 MG tablet TAKE 1 TABLET (5 MG TOTAL) BY MOUTH DAILY. 03/10/15  Yes Peter M [name], MD  aspirin 81 MG tablet Take 81 mg by mouth daily.    Yes Historical Provider, MD  bisoprolol (ZEBETA) 10 MG tablet Take 0.5 tablets (5 mg total) by mouth daily. 04/08/15  Yes Peter M [name], MD  budesonide-formoterol [facility]) 160-4.5 MCG/ACT inhaler Inhale 2 puffs into the lungs 2 (two) times daily. 05/06/14  Yes Robbie Lis, MD  losartan (COZAAR) 50 MG tablet Take 1 tablet (50 mg total) by mouth daily. 01/05/15  Yes [name], PA  metFORMIN (GLUCOPHAGE) 500 MG tablet Take by mouth 2 (two) times daily with a meal.   Yes Historical Provider, MD  pantoprazole (PROTONIX) 40 MG tablet Take 40 mg by mouth daily. 05/23/15  Yes Historical Provider, MD  potassium chloride SA (KLOR-CON M20) 20 MEQ tablet Take 1 tablet (20 mEq total) by mouth daily. 05/17/15  Yes Peter M [name], MD  rosuvastatin (CRESTOR) 20 MG tablet Take 20 mg daily for elevated cholesterol 04/12/15  Yes Peter M [name], MD  tiotropium (SPIRIVA HANDIHALER) 18 MCG inhalation capsule Place 1 capsule (18 mcg total) into inhaler and inhale daily. 05/06/14  Yes Robbie Lis, MD  nitroGLYCERIN (NITROSTAT) 0.4 MG SL tablet Place 1 tablet (0.4 mg total) under the tongue every 5 (five) minutes as needed for chest pain. 01/05/15   [name], PA  omeprazole (PRILOSEC) 20 MG capsule TAKE 1 CAPSULE (20 MG TOTAL) BY MOUTH 2 (TWO) TIMES DAILY BEFORE A MEAL. Patient not taking: Reported on 06/01/2015 05/30/15   Peter M [name], MD  Spacer/Aero-Holding Chambers (AEROCHAMBER MV) inhaler Use as instructed 11/22/14   Juanito Doom, MD    Physical Exam: Filed Vitals:   06/01/15  1215 06/01/15 1230 06/01/15 1245 06/01/15 1315  BP: 121/74 134/70 120/99 148/81  Pulse: 97 95 93 95  Temp:      TempSrc:      Resp: 24 24 23 24   Height:      Weight:      SpO2: 100% 100% 99% 96%    Constitutional:  Pleasant Asian male in NAD, calm, comfortable Filed Vitals:   06/01/15 1215 06/01/15 1230 06/01/15 1245 06/01/15 1315  BP: 121/74 134/70 120/99 148/81  Pulse: 97 95 93 95  Temp:      TempSrc:      Resp: 24 24 23 24   Height:      Weight:      SpO2: 100% 100% 99% 96%   Eyes: PER, lids and conjunctivae normal ENMT: Mucous membranes are moist. Posterior  pharynx clear of any exudate or lesions.Normal dentition.  Neck: normal, supple, no masses Respiratory:  Wheezing throughout, very little air movement. Breathing slightly labored on room air.  Cardiovascular: Regular rate and rhythm, no murmurs / rubs / gallops. No extremity edema. 2+ pedal pulses.  Abdomen: no tenderness, no masses palpated. No hepatomegaly. Bowel sounds positive.  Musculoskeletal: no clubbing / cyanosis. No joint deformity upper and lower extremities. Good ROM, no contractures. Normal muscle tone.  Skin: no rashes, lesions, ulcers.  Neurologic: CN 2-12 grossly intact. Sensation intact, Strength 5/5 in all 4.  Psychiatric: Normal judgment and insight. Alert and oriented x 3. Normal mood.   Labs on Admission: I have personally reviewed following labs and imaging studies  CBC:  Recent Labs Lab 06/01/15 0917  WBC 8.5  NEUTROABS 5.8  HGB 14.3  HCT 43.1  MCV 88.3  PLT 0000000   Basic Metabolic Panel:  Recent Labs Lab 06/01/15 0917  NA 139  K 3.5  CL 107  CO2 26  GLUCOSE 120*  BUN 15  CREATININE 0.75  CALCIUM 8.8*   ABGs   PH 7.38, arterial PCO2 40 8.9, bicarbonate 29  Radiological Exams on Admission: Dg Chest Port 1 View  06/01/2015  CLINICAL DATA:  Shortness of Breath EXAM: PORTABLE CHEST 1 VIEW COMPARISON:  12/23/2014 FINDINGS: Cardiomediastinal silhouette is stable. No acute  infiltrate or pleural effusion. No pulmonary edema. There is linear atelectasis or scarring in left upper lobe. Degenerative changes right AC joint. IMPRESSION: No infiltrate or pulmonary edema. Linear atelectasis or scarring left upper lobe. Electronically Signed   By: Lahoma Crocker M.D.   On: 06/01/2015 09:34    EKG: Independently reviewed.   EKG Interpretation  Date/Time:  Wednesday Jun 01 2015 08:50:03 EDT Ventricular Rate:  84 PR Interval:  136 QRS Duration: 87 QT Interval:  362 QTC Calculation: 428 R Axis:   73 Text Interpretation:  Sinus rhythm Confirmed by Jeneen Rinks  MD, [place] ([id number]) on 06/01/2015 9:01:34 AM     Echo Dec 2016  Study Conclusions   - Left ventricle: The cavity size was normal. There was mild   concentric hypertrophy. Systolic function was normal. The   estimated ejection fraction was in the range of 55% to 60%. Wall   motion was normal; there were no regional wall motion   abnormalities. Doppler parameters are consistent with abnormal   left ventricular relaxation (grade 1 diastolic dysfunction). - Aortic root: The aortic root was mildly dilated.   Left Heart Cath Dec 2016 [facility] on 12/27/2014 which showed 40% ost LAD, 80% ost D2, 50% mid LCx, patent stent in RCA. Aggressive secondary prevention was recommended   EKG Interpretation  Date/Time:  Wednesday Jun 01 2015 08:50:03 EDT Ventricular Rate:  84 PR Interval:  136 QRS Duration: 87 QT Interval:  362 QTC Calculation: 428 R Axis:   73 Text Interpretation:  Sinus rhythm Confirmed by Jeneen Rinks  MD, [place] ([id number]) on 06/01/2015 9:01:34 AM       Assessment/Pla  COPD exacerbation (HCC) with hypercarbic respiratory failure. Followed by Rafael Bihari for severe COPD. Little air movement / wheezing / increased WOB today.  -observation for COPD flare -COPD exacerbation order set utilized -Give supplemental 02. Sats okay on room air but working to breathe -levaquin -solumedrol -scheduled and prn Duoneb    CAD/ hx of  stent placement. . He does have chronic, intermittent chest pain and complained of chest pain in the field but not on my interview today. He describes postprandial abdominal  bloating. No EKG changes POC trop 0.00. -check troponin -continue home SL nitro prn -continue home asa, statin  Diastolic dysfunction, grade I on echo Dec 2016. No evidence for volume overload.  -daily wts -I+0  Diabetes mellitus type 2 . A1c one year ago - 5.8 -  A1c -  Hold oral medications -  SSI  Hypertension, controlled in ED -continue home Norvasc , zebeta, cozaar  Hyperlipidemia -continue home statin  GERD, stable.  Continue home PPI   Constipation. -Miralax daily  Weakness. Complains of difficulty walking.  PT evaluation                                          DVT prophylaxis:   Lovenox   Code Status:   Full code   Family Communication:   none Disposition Plan: Discharge home home in 24-48 hours              Consults called:   none Admission status:  Observation - Medical bed  Tye Savoy NP [facility] Pager [phone]  If 7PM-7AM, please contact night-coverage [email or web address] Password [email or web address]  06/01/2015, 1:52 PM

## 2015-06-01 NOTE — Progress Notes (Signed)
Took report from [place], ED RN

## 2015-06-02 ENCOUNTER — Ambulatory Visit: Payer: Commercial Managed Care - HMO | Admitting: Pulmonary Disease

## 2015-06-02 DIAGNOSIS — J441 Chronic obstructive pulmonary disease with (acute) exacerbation: Principal | ICD-10-CM

## 2015-06-02 DIAGNOSIS — J9622 Acute and chronic respiratory failure with hypercapnia: Secondary | ICD-10-CM

## 2015-06-02 DIAGNOSIS — I10 Essential (primary) hypertension: Secondary | ICD-10-CM

## 2015-06-02 DIAGNOSIS — E118 Type 2 diabetes mellitus with unspecified complications: Secondary | ICD-10-CM

## 2015-06-02 LAB — CBC
HCT: 45.7 % (ref 39.0–52.0)
HEMOGLOBIN: 14.7 g/dL (ref 13.0–17.0)
MCH: 29.1 pg (ref 26.0–34.0)
MCHC: 32.2 g/dL (ref 30.0–36.0)
MCV: 90.5 fL (ref 78.0–100.0)
Platelets: 313 10*3/uL (ref 150–400)
RBC: 5.05 MIL/uL (ref 4.22–5.81)
RDW: 15 % (ref 11.5–15.5)
WBC: 13.5 10*3/uL — ABNORMAL HIGH (ref 4.0–10.5)

## 2015-06-02 LAB — BASIC METABOLIC PANEL
ANION GAP: 10 (ref 5–15)
BUN: 20 mg/dL (ref 6–20)
CO2: 24 mmol/L (ref 22–32)
CREATININE: 0.91 mg/dL (ref 0.61–1.24)
Calcium: 8.8 mg/dL — ABNORMAL LOW (ref 8.9–10.3)
Chloride: 105 mmol/L (ref 101–111)
Glucose, Bld: 234 mg/dL — ABNORMAL HIGH (ref 65–99)
Potassium: 4.3 mmol/L (ref 3.5–5.1)
SODIUM: 139 mmol/L (ref 135–145)

## 2015-06-02 LAB — HEMOGLOBIN A1C
HEMOGLOBIN A1C: 7.9 % — AB (ref 4.8–5.6)
Mean Plasma Glucose: 180 mg/dL

## 2015-06-02 LAB — GLUCOSE, CAPILLARY
Glucose-Capillary: 187 mg/dL — ABNORMAL HIGH (ref 65–99)
Glucose-Capillary: 121 mg/dL — ABNORMAL HIGH (ref 65–99)
Glucose-Capillary: 216 mg/dL — ABNORMAL HIGH (ref 65–99)

## 2015-06-02 MED ORDER — IPRATROPIUM-ALBUTEROL 0.5-2.5 (3) MG/3ML IN SOLN
3.0000 mL | Freq: Three times a day (TID) | RESPIRATORY_TRACT | Status: DC
  Administered 2015-06-02 – 2015-06-03 (×4): 3 mL via RESPIRATORY_TRACT
  Filled 2015-06-02 (×4): qty 3

## 2015-06-02 MED ORDER — METFORMIN HCL 500 MG PO TABS
500.0000 mg | ORAL_TABLET | Freq: Two times a day (BID) | ORAL | Status: DC
  Administered 2015-06-02 – 2015-06-05 (×6): 500 mg via ORAL
  Filled 2015-06-02 (×6): qty 1

## 2015-06-02 NOTE — Progress Notes (Signed)
Pt noted to have removed oxygen multiple time this morning and appeared to be SOB. O2 sats remain above 92%. Pt reeducated on the importance of complying with order and reports understanding. Pt since has been observed with oxygen on during the afternoon and evening. No SOB observed.

## 2015-06-02 NOTE — Care Management Note (Addendum)
Case Management Note  Patient Details  Name: [name] MRN: [id number] Date of Birth: [birth year]  Subjective/Objective:               [name] is a 70 y.o. male with a Past Medical History of COPD, GERD, CAD status post cardiac catheterization with stent placement, HTN, HLD, tobacco abuse who presents with acute respiratory failure secondary to COPD exacerbation. Resides with wife. Independent with ADL's PTA. No DME usage. PCP: [name].   Action/Plan: Return to home when medically stable. CM to f/u with disposition needs.  Expected Discharge Date:                  Expected Discharge Plan:  Home/Self Care  In-House Referral:     Discharge planning Services  CM Consult  Post Acute Care Choice:    Choice offered to:     DME Arranged:    DME Agency:     HH Arranged:    HH Agency:     Status of Service:  In process, will continue to follow  Medicare Important Message Given:    Date Medicare IM Given:    Medicare IM give by:    Date Additional Medicare IM Given:    Additional Medicare Important Message give by:     If discussed at [place] of Stay Meetings, dates discussed:    Additional Comments: CM received consult for COPD protocol. Pt states is not on home 02, PCP manages COPD and has no problems paying for medications.   Whitman Hero [place], [place] [phone] 06/02/2015, 9:51 AM

## 2015-06-02 NOTE — Progress Notes (Signed)
UR COMPLETED  

## 2015-06-02 NOTE — Progress Notes (Addendum)
PROGRESS NOTE                                                                                                                                                                                                             Patient Demographics:    [name], is a 70 y.o. male, DOB - 08-22-1945, [id number]  Admit date - 06/01/2015   Admitting Physician Waldemar Dickens, MD  Outpatient Primary MD for the patient is [name], MD  LOS - 1  Chief Complaint  Patient presents with  . Shortness of Breath  . Chest Pain       Brief Narrative   70 y.o. male with  history of coronary artery disease s/p BMS 2014, hypertension, and COPD, admitted for COPD his probation.   Subjective:    Blease Campusano today has, No headache, No chest pain, No abdominal pain -Complaints of dyspnea, as well as orthopnea   Assessment  & Plan :    Active Problems:   GERD   Hyperlipidemia   Essential hypertension   COPD exacerbation (HCC)   Diabetes mellitus, type 2 (HCC)   Acute on chronic respiratory failure with hypercapnia (HCC)   Diabetes mellitus with complication (HCC)  COPD exacerbation ([place]) with hypercarbic respiratory failure.  - Chest x-ray with no evidence of pneumonia or volume overload - Significant wheezing and diminished air entry on physical exam - Continue with IV steroids, no further tapering 4 per day, giving productive cough we'll continue with IV levofloxacin - Continue with pulmonary toilet, nebs, flutter valve - Leukocytosis most likely related to steroids, a febrile  CAD/ hx of stent placement.  - He does have chronic, intermittent chest pain and complained of chest pain in the field but not on my interview today. He describes postprandial abdominal bloating. No EKG changes -troponin < 0.03 -continue home SL nitro prn -continue home asa, statin  Diastolic dysfunction,  - grade I on echo Dec 2016. No evidence for volume overload.  -  Continue with daily weights and strict ins and outs  Diabetes mellitus type 2 .  - A1c one year ago - 5.8, repeat is 7.9 this admission - Continue with insulin sliding scale, resume metformin  Hypertension,  -continue home Norvasc , zebeta, cozaar  Hyperlipidemia -continue home statin  GERD, stable.  Continue home PPI  Constipation. -Miralax daily  Weakness.  PT evaluation     Code Status : Full  Family Communication  : None at bedside  Disposition Plan  : Home once stable  Consults  :  None  Procedures  : None  DVT Prophylaxis  :  Lovenox   Lab Results  Component Value Date   PLT 313 06/02/2015    Antibiotics  :    Anti-infectives    Start     Dose/Rate Route Frequency Ordered Stop   06/01/15 1800  levofloxacin (LEVAQUIN) IVPB 750 mg     750 mg 100 mL/hr over 90 Minutes Intravenous Every 24 hours 06/01/15 1653     06/01/15 1430  levofloxacin (LEVAQUIN) IVPB 750 mg  Status:  Discontinued     750 mg 100 mL/hr over 90 Minutes Intravenous Every 24 hours 06/01/15 1420 06/01/15 1653        Objective:   Filed Vitals:   06/01/15 2159 06/02/15 0241 06/02/15 0523 06/02/15 0955  BP: 122/71  130/83 130/70  Pulse: 68 82 73 81  Temp: 97.8 F (36.6 C)  98.7 F (37.1 C)   TempSrc: Oral  Oral   Resp: 18 20 18    Height:      Weight:      SpO2: 100% 96% 99% 100%    Wt Readings from Last 3 Encounters:  06/01/15 63.504 kg (140 lb)  04/08/15 64.955 kg (143 lb 3.2 oz)  03/01/15 63.05 kg (139 lb)     Intake/Output Summary (Last 24 hours) at 06/02/15 1215 Last data filed at 06/02/15 1000  Gross per 24 hour  Intake    240 ml  Output      0 ml  Net    240 ml     Physical Exam  Awake Alert, Oriented X 3, [place].AT,PERRAL Supple Neck,No JVD,  Symmetrical Chest wall movement, Diminished air movement bilaterally, diffuse wheezing RRR,No Gallops,Rubs or new Murmurs, No Parasternal Heave +ve B.Sounds, Abd Soft, No  tenderness, No organomegaly appriciated, No rebound - guarding or rigidity. No Cyanosis, Clubbing or edema, No new Rash or bruise     Data Review:    CBC  Recent Labs Lab 06/01/15 0917 06/02/15 0640  WBC 8.5 13.5*  HGB 14.3 14.7  HCT 43.1 45.7  PLT 278 313  MCV 88.3 90.5  MCH 29.3 29.1  MCHC 33.2 32.2  RDW 15.1 15.0  LYMPHSABS 1.6  --   MONOABS 1.1*  --   EOSABS 0.1  --   BASOSABS 0.0  --     Chemistries   Recent Labs Lab 06/01/15 0917 06/02/15 0640  NA 139 139  K 3.5 4.3  CL 107 105  CO2 26 24  GLUCOSE 120* 234*  BUN 15 20  CREATININE 0.75 0.91  CALCIUM 8.8* 8.8*   ------------------------------------------------------------------------------------------------------------------ No results for input(s): CHOL, HDL, LDLCALC, TRIG, CHOLHDL, LDLDIRECT in the last 72 hours.  Lab Results  Component Value Date   HGBA1C 7.9* 06/01/2015   ------------------------------------------------------------------------------------------------------------------ No results for input(s): TSH, T4TOTAL, T3FREE, THYROIDAB in the last 72 hours.  Invalid input(s): FREET3 ------------------------------------------------------------------------------------------------------------------ No results for input(s): VITAMINB12, FOLATE, FERRITIN, TIBC, IRON, RETICCTPCT in the last 72 hours.  Coagulation profile No results for input(s): INR, PROTIME in the last 168 hours.  No results for input(s): DDIMER in the last 72 hours.  Cardiac Enzymes  Recent Labs Lab 06/01/15 1521  TROPONINI <0.03   ------------------------------------------------------------------------------------------------------------------    Component Value Date/Time   BNP 45.4 12/23/2014 1054  Inpatient Medications  Scheduled Meds: . amLODipine  5 mg Oral Daily  . aspirin EC  81 mg Oral Daily  . bisoprolol  5 mg Oral Daily  . enoxaparin (LOVENOX) injection  40 mg Subcutaneous Q24H  . guaiFENesin  1,200  mg Oral BID  . insulin aspart  0-9 Units Subcutaneous TID WC  . ipratropium-albuterol  3 mL Nebulization TID  . levofloxacin (LEVAQUIN) IV  750 mg Intravenous Q24H  . losartan  50 mg Oral Daily  . methylPREDNISolone (SOLU-MEDROL) injection  60 mg Intravenous Q6H  . pantoprazole  40 mg Oral Daily  . potassium chloride SA  20 mEq Oral Daily  . rosuvastatin  20 mg Oral q1800   Continuous Infusions:  PRN Meds:.acetaminophen **OR** acetaminophen, bisacodyl, HYDROcodone-acetaminophen, ipratropium-albuterol, LORazepam, nitroGLYCERIN, ondansetron **OR** ondansetron (ZOFRAN) IV, polyethylene glycol, traZODone  Micro Results No results found for this or any previous visit (from the past 240 hour(s)).  Radiology Reports Dg Chest Port 1 View  06/01/2015  CLINICAL DATA:  Shortness of Breath EXAM: PORTABLE CHEST 1 VIEW COMPARISON:  12/23/2014 FINDINGS: Cardiomediastinal silhouette is stable. No acute infiltrate or pleural effusion. No pulmonary edema. There is linear atelectasis or scarring in left upper lobe. Degenerative changes right AC joint. IMPRESSION: No infiltrate or pulmonary edema. Linear atelectasis or scarring left upper lobe. Electronically Signed   By: Lahoma Crocker M.D.   On: 06/01/2015 09:34    Time Spent in minutes  25 minutes   Monserratt Knezevic M.D on 06/02/2015 at 12:15 PM  Between 7am to 7pm - Pager - [phone]  After 7pm go to [email or web address] - password [facility]  [facility] -  Office  [phone]

## 2015-06-03 LAB — BASIC METABOLIC PANEL
Anion gap: 7 (ref 5–15)
BUN: 26 mg/dL — AB (ref 6–20)
CHLORIDE: 105 mmol/L (ref 101–111)
CO2: 28 mmol/L (ref 22–32)
Calcium: 8.6 mg/dL — ABNORMAL LOW (ref 8.9–10.3)
Creatinine, Ser: 0.99 mg/dL (ref 0.61–1.24)
GFR calc Af Amer: >60 mL/min (ref >60)
GFR calc non Af Amer: >60 mL/min (ref >60)
GLUCOSE: 190 mg/dL — AB (ref 65–99)
POTASSIUM: 5.1 mmol/L (ref 3.5–5.1)
SODIUM: 140 mmol/L (ref 135–145)

## 2015-06-03 LAB — GLUCOSE, CAPILLARY
GLUCOSE-CAPILLARY: 160 mg/dL — AB (ref 65–99)
Glucose-Capillary: 180 mg/dL — ABNORMAL HIGH (ref 65–99)
Glucose-Capillary: 206 mg/dL — ABNORMAL HIGH (ref 65–99)
Glucose-Capillary: 163 mg/dL — ABNORMAL HIGH (ref 65–99)

## 2015-06-03 LAB — CBC
HEMATOCRIT: 43.2 % (ref 39.0–52.0)
HEMOGLOBIN: 13.8 g/dL (ref 13.0–17.0)
MCH: 28.5 pg (ref 26.0–34.0)
MCHC: 31.9 g/dL (ref 30.0–36.0)
MCV: 89.1 fL (ref 78.0–100.0)
Platelets: 286 10*3/uL (ref 150–400)
RBC: 4.85 MIL/uL (ref 4.22–5.81)
RDW: 14.8 % (ref 11.5–15.5)
WBC: 18.7 10*3/uL — ABNORMAL HIGH (ref 4.0–10.5)

## 2015-06-03 LAB — TROPONIN I: Troponin I: 0.13 ng/mL — ABNORMAL HIGH (ref <0.031)

## 2015-06-03 MED ORDER — ASPIRIN 81 MG PO CHEW
243.0000 mg | CHEWABLE_TABLET | Freq: Once | ORAL | Status: AC
  Administered 2015-06-03: 243 mg via ORAL

## 2015-06-03 MED ORDER — METHYLPREDNISOLONE SODIUM SUCC 125 MG IJ SOLR
60.0000 mg | Freq: Three times a day (TID) | INTRAMUSCULAR | Status: DC
  Administered 2015-06-03 – 2015-06-04 (×3): 60 mg via INTRAVENOUS
  Filled 2015-06-03 (×3): qty 2

## 2015-06-03 MED ORDER — INSULIN ASPART 100 UNIT/ML ~~LOC~~ SOLN
0.0000 [IU] | Freq: Three times a day (TID) | SUBCUTANEOUS | Status: DC
  Administered 2015-06-03 – 2015-06-04 (×2): 3 [IU] via SUBCUTANEOUS
  Administered 2015-06-04: 15 [IU] via SUBCUTANEOUS
  Administered 2015-06-04 – 2015-06-05 (×3): 3 [IU] via SUBCUTANEOUS

## 2015-06-03 MED ORDER — ARFORMOTEROL TARTRATE 15 MCG/2ML IN NEBU
15.0000 ug | INHALATION_SOLUTION | Freq: Two times a day (BID) | RESPIRATORY_TRACT | Status: DC
  Administered 2015-06-03 – 2015-06-05 (×4): 15 ug via RESPIRATORY_TRACT
  Filled 2015-06-03 (×4): qty 2

## 2015-06-03 MED ORDER — LEVOFLOXACIN 750 MG PO TABS
750.0000 mg | ORAL_TABLET | ORAL | Status: DC
  Administered 2015-06-03 – 2015-06-04 (×2): 750 mg via ORAL
  Filled 2015-06-03 (×2): qty 1

## 2015-06-03 MED ORDER — ASPIRIN 81 MG PO CHEW
CHEWABLE_TABLET | ORAL | Status: AC
  Filled 2015-06-03: qty 3

## 2015-06-03 MED ORDER — IPRATROPIUM-ALBUTEROL 0.5-2.5 (3) MG/3ML IN SOLN
3.0000 mL | Freq: Two times a day (BID) | RESPIRATORY_TRACT | Status: DC
  Administered 2015-06-03 – 2015-06-05 (×4): 3 mL via RESPIRATORY_TRACT
  Filled 2015-06-03 (×4): qty 3

## 2015-06-03 NOTE — Progress Notes (Signed)
Around 1810, pt reported having chest pain and SOB after attempting to ambulate the unit. Pt reported pain as tight and burning. Pt stated the feeling is similar to past MI. Vitals stable. Nitro x1 given per order. MD notified. New orders for stat EKG, troponin levels x3, telemetry, and 3 81mg  aspirins to be given once. Pt reported chest pain had resolved after one dose of Nitro. Pt is resting with family at bedside.

## 2015-06-03 NOTE — Consult Note (Signed)
   [facility] CM Inpatient Consult   06/03/2015  [name] [birth year] [id number]  Patient screened for potential [place] Management services. Patient is eligible for [place] spoke with inpatient RNCM regarding needs.  States that patient could benefit from COPD EMMI. A brochure was left a the patient's bedside for [facility] Care Management with contact information. For questions please contact:   Natividad Brood, RN BSN [facility] Liaison  [phone] business mobile phone Toll free office [phone]

## 2015-06-03 NOTE — Evaluation (Signed)
Physical Therapy Evaluation Patient Details Name: [name] MRN: [id number] DOB: [birth year] Today's Date: 06/03/2015   History of Present Illness  70 y.o. male with h/o DM2, GERD, CAD, HTN, COPD admitted with COPD exacerbation.   Clinical Impression  Pt is independent with mobility. He ambulated 200' without an assistive device, no loss of balance, SaO2 95% on RA while walking, 2-3/4 dyspnea.  He would benefit from outpt cardiopulmonary rehab. No further PT needed, PT signing off. Pt is safe to walk in halls ad lib.   Follow Up Recommendations Other (comment) (outpatient cardiopulmonary rehab)    Equipment Recommendations  None recommended by PT    Recommendations for Other Services OT consult (for energy conservation)     Precautions / Restrictions Precautions Precautions: None Restrictions Weight Bearing Restrictions: No      Mobility  Bed Mobility Overal bed mobility: Independent                Transfers Overall transfer level: Independent                  Ambulation/Gait Ambulation/Gait assistance: Independent Ambulation Distance (Feet): 200 Feet Assistive device: None Gait Pattern/deviations: WFL(Within Functional Limits)   Gait velocity interpretation: Below normal speed for age/gender General Gait Details: steady, no loss of balance, mildly decreased velocity due to SOB/fatigue, SaO2 95% on RA with ambulation, 2/4 dyspnea  Stairs            Wheelchair Mobility    Modified Rankin (Stroke Patients Only)       Balance Overall balance assessment: No apparent balance deficits (not formally assessed)                                           Pertinent Vitals/Pain Pain Assessment: Faces Faces Pain Scale: Hurts little more Pain Location: chest Pain Descriptors / Indicators: Aching Pain Intervention(s): Limited activity within patient's tolerance;Monitored during session    Home Living Family/patient expects to be  discharged to:: Private residence Living Arrangements: Spouse/significant other;Children Available Help at Discharge: Family;Available 24 hours/day   Home Access: Level entry     Home Layout: One level Home Equipment: None      Prior Function Level of Independence: Independent               Hand Dominance        Extremity/Trunk Assessment   Upper Extremity Assessment: Overall WFL for tasks assessed           Lower Extremity Assessment: Overall WFL for tasks assessed      Cervical / Trunk Assessment: Normal  Communication   Communication: Prefers language other than [name];No difficulties (pt is [name] but can communicate in [name])  Cognition Arousal/Alertness: Awake/alert Behavior During Therapy: WFL for tasks assessed/performed Overall Cognitive Status: Within Functional Limits for tasks assessed                      General Comments      Exercises        Assessment/Plan    PT Assessment All further PT needs can be met in the next venue of care  PT Diagnosis     PT Problem List    PT Treatment Interventions     PT Goals (Current goals can be found in the Care Plan section) Acute Rehab PT Goals PT Goal Formulation: All assessment and education complete, DC  therapy    Frequency     Barriers to discharge        Co-evaluation               End of Session Equipment Utilized During Treatment: Gait belt Activity Tolerance: Patient limited by fatigue Patient left: in bed;with call bell/phone within reach Nurse Communication: Mobility status         Time: 8343-7357 PT Time Calculation (min) (ACUTE ONLY): 13 min   Charges:   PT Evaluation $PT Eval Low Complexity: 1 Procedure     PT G Codes:        Philomena Doheny 06/03/2015, 9:56 AM [phone]

## 2015-06-03 NOTE — Progress Notes (Signed)
PHARMACIST - PHYSICIAN COMMUNICATION DR:   TRH CONCERNING: Antibiotic IV to Oral Route Change Policy  RECOMMENDATION: This patient is receiving levaquin by the intravenous route.  Based on criteria approved by the Pharmacy and Therapeutics Committee, the antibiotic(s) is/are being converted to the equivalent oral dose form(s).   DESCRIPTION: These criteria include:  Patient being treated for a respiratory tract infection, urinary tract infection, cellulitis or clostridium difficile associated diarrhea if on metronidazole  The patient is not neutropenic and does not exhibit a GI malabsorption state  The patient is eating (either orally or via tube) and/or has been taking other orally administered medications for a least 24 hours  The patient is improving clinically and has a Tmax < 100.5  If you have questions about this conversion, please contact the Pharmacy Department  []   [phone] )  [facility] []   [phone] )  [facility] [x]   [phone] )  [facility] []   [phone] )  [facility] []   [phone] )  [facility]

## 2015-06-03 NOTE — Care Management Important Message (Signed)
Important Message  Patient Details  Name: [name] MRN: [id number] Date of Birth: Dec 14, 1945   Medicare Important Message Given:  Yes    Cordae Mccarey Abena 06/03/2015, 1:30 PM

## 2015-06-03 NOTE — Progress Notes (Addendum)
PROGRESS NOTE                                                                                                                                                                                                             Patient Demographics:    [name], is a 70 y.o. male, DOB - 20-Apr-1945, [id number]  Admit date - 06/01/2015   Admitting Physician Waldemar Dickens, MD  Outpatient Primary MD for the patient is [name], MD  LOS - 2  Chief Complaint  Patient presents with  . Shortness of Breath  . Chest Pain       Brief Narrative   70 y.o. male with  history of coronary artery disease s/p BMS 2014, hypertension, and COPD, admitted for COPD Exacerbation.   Subjective:    [name] today has, No headache, No chest pain, No abdominal pain -Complaints of dyspnea.   Assessment  & Plan :    Active Problems:   GERD   Hyperlipidemia   Essential hypertension   COPD exacerbation (HCC)   Diabetes mellitus, type 2 (HCC)   Acute on chronic respiratory failure with hypercapnia (HCC)   Diabetes mellitus with complication (HCC)  COPD exacerbation ([place]) with hypercarbic respiratory failure.  - Chest x-ray with no evidence of pneumonia or volume overload - Significant wheezing and diminished air entry on physical exam - Continue with IV steroids, no further tapering Today thinking significant wheezing, will add Brovana,, giving productive cough we'll continue with IV levofloxacin - Continue with pulmonary toilet, nebs, flutter valve - Leukocytosis most likely related to steroids, afebrile  CAD/ hx of stent placement.  - He does have chronic, intermittent chest pain and complained of chest pain in the field but not on my interview today. He describes postprandial abdominal bloating. No EKG changes -troponin < 0.03 -continue home SL nitro prn -continue home asa, statin  Diastolic dysfunction,  - grade I on echo Dec 2016. No evidence for  volume overload.  - Continue with daily weights and strict ins and outs  Diabetes mellitus type 2 .  - A1c one year ago - 5.8, repeat is 7.9 this admission -DG is a elevated on metformin, will change sliding scale from sensitive to moderate giving he is on steroids  Hypertension,  -continue home Norvasc , zebeta, cozaar  Hyperlipidemia -continue home  statin  GERD, stable.  Continue home PPI   Constipation. -Miralax daily  Weakness.  PT evaluation     Code Status : Full  Family Communication  : None at bedside  Disposition Plan  : Home once stable, possibly within 2-3 days.  Consults  :  None  Procedures  : None  DVT Prophylaxis  :  Lovenox   Lab Results  Component Value Date   PLT 286 06/03/2015    Antibiotics  :    Anti-infectives    Start     Dose/Rate Route Frequency Ordered Stop   06/03/15 1800  levofloxacin (LEVAQUIN) tablet 750 mg     750 mg Oral Every 24 hours 06/03/15 1114     06/01/15 1800  levofloxacin (LEVAQUIN) IVPB 750 mg  Status:  Discontinued     750 mg 100 mL/hr over 90 Minutes Intravenous Every 24 hours 06/01/15 1653 06/03/15 1114   06/01/15 1430  levofloxacin (LEVAQUIN) IVPB 750 mg  Status:  Discontinued     750 mg 100 mL/hr over 90 Minutes Intravenous Every 24 hours 06/01/15 1420 06/01/15 1653        Objective:   Filed Vitals:   06/02/15 2237 06/03/15 0523 06/03/15 0902 06/03/15 0949  BP: 110/66 125/75 139/78   Pulse: 75 78    Temp: 98.2 F (36.8 C) 98.2 F (36.8 C)    TempSrc: Oral Oral    Resp: 20 20    Height:      Weight:      SpO2: 98% 97%  95%    Wt Readings from Last 3 Encounters:  06/01/15 63.504 kg (140 lb)  04/08/15 64.955 kg (143 lb 3.2 oz)  03/01/15 63.05 kg (139 lb)     Intake/Output Summary (Last 24 hours) at 06/03/15 1402 Last data filed at 06/03/15 0956  Gross per 24 hour  Intake    240 ml  Output   1025 ml  Net   -785 ml     Physical Exam  Awake  Alert, Oriented X 3, Ord.AT,PERRAL Supple Neck,No JVD,  Symmetrical Chest wall movement, Diminished air movement bilaterally, Scattered wheezing RRR,No Gallops,Rubs or new Murmurs, No Parasternal Heave +ve B.Sounds, Abd Soft, No tenderness, No organomegaly appriciated, No rebound - guarding or rigidity. No Cyanosis, Clubbing or edema, No new Rash or bruise     Data Review:    CBC  Recent Labs Lab 06/01/15 0917 06/02/15 0640 06/03/15 0456  WBC 8.5 13.5* 18.7*  HGB 14.3 14.7 13.8  HCT 43.1 45.7 43.2  PLT 278 313 286  MCV 88.3 90.5 89.1  MCH 29.3 29.1 28.5  MCHC 33.2 32.2 31.9  RDW 15.1 15.0 14.8  LYMPHSABS 1.6  --   --   MONOABS 1.1*  --   --   EOSABS 0.1  --   --   BASOSABS 0.0  --   --     Chemistries   Recent Labs Lab 06/01/15 0917 06/02/15 0640 06/03/15 0456  NA 139 139 140  K 3.5 4.3 5.1  CL 107 105 105  CO2 26 24 28   GLUCOSE 120* 234* 190*  BUN 15 20 26*  CREATININE 0.75 0.91 0.99  CALCIUM 8.8* 8.8* 8.6*   ------------------------------------------------------------------------------------------------------------------ No results for input(s): CHOL, HDL, LDLCALC, TRIG, CHOLHDL, LDLDIRECT in the last 72 hours.  Lab Results  Component Value Date   HGBA1C 7.9* 06/01/2015   ------------------------------------------------------------------------------------------------------------------ No results for input(s): TSH, T4TOTAL, T3FREE, THYROIDAB in the last 72 hours.  Invalid input(s): FREET3 ------------------------------------------------------------------------------------------------------------------  No results for input(s): VITAMINB12, FOLATE, FERRITIN, TIBC, IRON, RETICCTPCT in the last 72 hours.  Coagulation profile No results for input(s): INR, PROTIME in the last 168 hours.  No results for input(s): DDIMER in the last 72 hours.  Cardiac Enzymes  Recent Labs Lab 06/01/15 1521  TROPONINI <0.03    ------------------------------------------------------------------------------------------------------------------    Component Value Date/Time   BNP 45.4 12/23/2014 1054    Inpatient Medications  Scheduled Meds: . amLODipine  5 mg Oral Daily  . arformoterol  15 mcg Nebulization BID  . aspirin EC  81 mg Oral Daily  . bisoprolol  5 mg Oral Daily  . enoxaparin (LOVENOX) injection  40 mg Subcutaneous Q24H  . guaiFENesin  1,200 mg Oral BID  . insulin aspart  0-9 Units Subcutaneous TID WC  . ipratropium-albuterol  3 mL Nebulization BID  . levofloxacin  750 mg Oral Q24H  . losartan  50 mg Oral Daily  . metFORMIN  500 mg Oral BID WC  . methylPREDNISolone (SOLU-MEDROL) injection  60 mg Intravenous Q8H  . pantoprazole  40 mg Oral Daily  . potassium chloride SA  20 mEq Oral Daily  . rosuvastatin  20 mg Oral q1800   Continuous Infusions:  PRN Meds:.acetaminophen **OR** acetaminophen, bisacodyl, HYDROcodone-acetaminophen, ipratropium-albuterol, LORazepam, nitroGLYCERIN, ondansetron **OR** ondansetron (ZOFRAN) IV, polyethylene glycol, traZODone  Micro Results No results found for this or any previous visit (from the past 240 hour(s)).  Radiology Reports Dg Chest Port 1 View  06/01/2015  CLINICAL DATA:  Shortness of Breath EXAM: PORTABLE CHEST 1 VIEW COMPARISON:  12/23/2014 FINDINGS: Cardiomediastinal silhouette is stable. No acute infiltrate or pleural effusion. No pulmonary edema. There is linear atelectasis or scarring in left upper lobe. Degenerative changes right AC joint. IMPRESSION: No infiltrate or pulmonary edema. Linear atelectasis or scarring left upper lobe. Electronically Signed   By: Lahoma Crocker M.D.   On: 06/01/2015 09:34    Time Spent in minutes  25 minutes   ELGERGAWY, DAWOOD M.D on 06/03/2015 at 2:02 PM  Between 7am to 7pm - Pager - [phone]  After 7pm go to [email or web address] - password [facility]  [facility] -  Office  [phone]

## 2015-06-04 LAB — BASIC METABOLIC PANEL
Anion gap: 10 (ref 5–15)
BUN: 32 mg/dL — AB (ref 6–20)
CHLORIDE: 100 mmol/L — AB (ref 101–111)
CO2: 25 mmol/L (ref 22–32)
Calcium: 8.8 mg/dL — ABNORMAL LOW (ref 8.9–10.3)
Creatinine, Ser: 1.03 mg/dL (ref 0.61–1.24)
GFR calc Af Amer: >60 mL/min (ref >60)
GFR calc non Af Amer: >60 mL/min (ref >60)
Glucose, Bld: 237 mg/dL — ABNORMAL HIGH (ref 65–99)
POTASSIUM: 4.7 mmol/L (ref 3.5–5.1)
SODIUM: 135 mmol/L (ref 135–145)

## 2015-06-04 LAB — GLUCOSE, CAPILLARY
GLUCOSE-CAPILLARY: 351 mg/dL — AB (ref 65–99)
GLUCOSE-CAPILLARY: 200 mg/dL — AB (ref 65–99)
GLUCOSE-CAPILLARY: 167 mg/dL — AB (ref 65–99)
Glucose-Capillary: 179 mg/dL — ABNORMAL HIGH (ref 65–99)

## 2015-06-04 LAB — TROPONIN I
TROPONIN I: 0.03 ng/mL (ref <0.031)
Troponin I: 0.05 ng/mL — ABNORMAL HIGH (ref <0.031)

## 2015-06-04 MED ORDER — METHYLPREDNISOLONE SODIUM SUCC 40 MG IJ SOLR
40.0000 mg | Freq: Three times a day (TID) | INTRAMUSCULAR | Status: DC
  Administered 2015-06-04 – 2015-06-05 (×3): 40 mg via INTRAVENOUS
  Filled 2015-06-04 (×3): qty 1

## 2015-06-04 NOTE — Progress Notes (Signed)
Pt's order for 3 runs of Troponin did not transfer to the lab. Individual single troponin order was placed 3 times. Will continue to monitor.

## 2015-06-04 NOTE — Progress Notes (Signed)
PROGRESS NOTE                                                                                                                                                                                                             Patient Demographics:    [name], is a 70 y.o. male, DOB - 21-Sep-1945, DM:[id number]  Admit date - 06/01/2015   Admitting Physician Waldemar Dickens, MD  Outpatient Primary MD for the patient is [name], MD  LOS - 3  Chief Complaint  Patient presents with  . Shortness of Breath  . Chest Pain       Brief Narrative   70 y.o. male with  history of coronary artery disease s/p BMS 2014, hypertension, and COPD, admitted for COPD Exacerbation.   Subjective:    [name] today has, No headache, No chest pain, No abdominal pain , Dyspnea improving, complaints of an episode of chest pain yesterday   Assessment  & Plan :    Active Problems:   GERD   Hyperlipidemia   Essential hypertension   COPD exacerbation (HCC)   Diabetes mellitus, type 2 (HCC)   Acute on chronic respiratory failure with hypercapnia (HCC)   Diabetes mellitus with complication (HCC)  COPD exacerbation ([place]) with hypercarbic respiratory failure.  - Chest x-ray with no evidence of pneumonia or volume overload - Significant wheezing and diminished air entry on physical exam - Continue with IV steroids,Wheezing improved today, taper his IV steroids, started Brovana, giving productive cough we'll continue with  levofloxacin - Continue with pulmonary toilet, nebs, flutter valve - Leukocytosis most likely related to steroids, afebrile  CAD/ hx of stent placement.  - He does have chronic, intermittent chest pain and complained of chest pain in the field but not on my interview today. He describes postprandial abdominal bloating. No EKG changes - Patient with complaint of chest pain yesterday evening, troponin initially 0.13>0.03>0.05, EKG nonacute, D/W  cardiology Dr Domenic Polite, given no recurrence of chest pain, agent is currently stable, no further workup indicated at this point, we'll need to follow with Dr. [name] as an outpatient. -continue home SL nitro prn -continue home asa, statin  Diastolic dysfunction,  - grade I on echo Dec 2016. No evidence for volume overload.  - Continue with daily weights and strict ins and outs  Diabetes mellitus  type 2 .  - A1c one year ago - 5.8, repeat is 7.9 this admission -DG is a elevated on metformin,  changed sliding scale from sensitive to moderate giving he is on steroids  Hypertension,  -continue home Norvasc , zebeta, cozaar  Hyperlipidemia -continue home statin  GERD, stable.  Continue home PPI   Constipation. -Miralax daily  Weakness.  PT evaluation     Code Status : Full  Family Communication  : None at bedside  Disposition Plan  : Home once stable, possibly in 24 hours, will need outpatient cardiopulmonary rehabilitation.  Consults  :  None  Procedures  : None  DVT Prophylaxis  :  Lovenox   Lab Results  Component Value Date   PLT 286 06/03/2015    Antibiotics  :    Anti-infectives    Start     Dose/Rate Route Frequency Ordered Stop   06/03/15 1800  levofloxacin (LEVAQUIN) tablet 750 mg     750 mg Oral Every 24 hours 06/03/15 1114     06/01/15 1800  levofloxacin (LEVAQUIN) IVPB 750 mg  Status:  Discontinued     750 mg 100 mL/hr over 90 Minutes Intravenous Every 24 hours 06/01/15 1653 06/03/15 1114   06/01/15 1430  levofloxacin (LEVAQUIN) IVPB 750 mg  Status:  Discontinued     750 mg 100 mL/hr over 90 Minutes Intravenous Every 24 hours 06/01/15 1420 06/01/15 1653        Objective:   Filed Vitals:   06/03/15 2123 06/04/15 0544 06/04/15 0912 06/04/15 0917  BP: 116/61 122/63    Pulse: 80 75    Temp: 98.5 F (36.9 C) 98.3 F (36.8 C)    TempSrc:  Oral    Resp: 20 18    Height:      Weight:      SpO2: 94% 97%  98% 98%    Wt Readings from Last 3 Encounters:  06/01/15 63.504 kg (140 lb)  04/08/15 64.955 kg (143 lb 3.2 oz)  03/01/15 63.05 kg (139 lb)     Intake/Output Summary (Last 24 hours) at 06/04/15 1315 Last data filed at 06/04/15 0900  Gross per 24 hour  Intake    120 ml  Output      0 ml  Net    120 ml     Physical Exam  Awake Alert, Oriented X 3, [place].AT,PERRAL Supple Neck,No JVD,  Symmetrical Chest wall movement, Proved air movement bilaterally, wheezing significantly subsided RRR,No Gallops,Rubs or new Murmurs, No Parasternal Heave +ve B.Sounds, Abd Soft, No tenderness, No organomegaly appriciated, No rebound - guarding or rigidity. No Cyanosis, Clubbing or edema, No new Rash or bruise     Data Review:    CBC  Recent Labs Lab 06/01/15 0917 06/02/15 0640 06/03/15 0456  WBC 8.5 13.5* 18.7*  HGB 14.3 14.7 13.8  HCT 43.1 45.7 43.2  PLT 278 313 286  MCV 88.3 90.5 89.1  MCH 29.3 29.1 28.5  MCHC 33.2 32.2 31.9  RDW 15.1 15.0 14.8  LYMPHSABS 1.6  --   --   MONOABS 1.1*  --   --   EOSABS 0.1  --   --   BASOSABS 0.0  --   --     Chemistries   Recent Labs Lab 06/01/15 0917 06/02/15 0640 06/03/15 0456 06/04/15 0218  NA 139 139 140 135  K 3.5 4.3 5.1 4.7  CL 107 105 105 100*  CO2 26 24 28 25   GLUCOSE 120* 234* 190* 237*  BUN 15 20 26* 32*  CREATININE 0.75 0.91 0.99 1.03  CALCIUM 8.8* 8.8* 8.6* 8.8*   ------------------------------------------------------------------------------------------------------------------ No results for input(s): CHOL, HDL, LDLCALC, TRIG, CHOLHDL, LDLDIRECT in the last 72 hours.  Lab Results  Component Value Date   HGBA1C 7.9* 06/01/2015   ------------------------------------------------------------------------------------------------------------------ No results for input(s): TSH, T4TOTAL, T3FREE, THYROIDAB in the last 72 hours.  Invalid input(s):  FREET3 ------------------------------------------------------------------------------------------------------------------ No results for input(s): VITAMINB12, FOLATE, FERRITIN, TIBC, IRON, RETICCTPCT in the last 72 hours.  Coagulation profile No results for input(s): INR, PROTIME in the last 168 hours.  No results for input(s): DDIMER in the last 72 hours.  Cardiac Enzymes  Recent Labs Lab 06/03/15 2047 06/04/15 0218 06/04/15 0608  TROPONINI 0.13* 0.03 0.05*   ------------------------------------------------------------------------------------------------------------------    Component Value Date/Time   BNP 45.4 12/23/2014 1054    Inpatient Medications  Scheduled Meds: . amLODipine  5 mg Oral Daily  . arformoterol  15 mcg Nebulization BID  . aspirin EC  81 mg Oral Daily  . bisoprolol  5 mg Oral Daily  . enoxaparin (LOVENOX) injection  40 mg Subcutaneous Q24H  . guaiFENesin  1,200 mg Oral BID  . insulin aspart  0-15 Units Subcutaneous TID WC  . ipratropium-albuterol  3 mL Nebulization BID  . levofloxacin  750 mg Oral Q24H  . losartan  50 mg Oral Daily  . metFORMIN  500 mg Oral BID WC  . methylPREDNISolone (SOLU-MEDROL) injection  40 mg Intravenous Q8H  . pantoprazole  40 mg Oral Daily  . potassium chloride SA  20 mEq Oral Daily  . rosuvastatin  20 mg Oral q1800   Continuous Infusions:  PRN Meds:.acetaminophen **OR** acetaminophen, bisacodyl, HYDROcodone-acetaminophen, ipratropium-albuterol, LORazepam, nitroGLYCERIN, ondansetron **OR** ondansetron (ZOFRAN) IV, polyethylene glycol, traZODone  Micro Results No results found for this or any previous visit (from the past 240 hour(s)).  Radiology Reports Dg Chest Port 1 View  06/01/2015  CLINICAL DATA:  Shortness of Breath EXAM: PORTABLE CHEST 1 VIEW COMPARISON:  12/23/2014 FINDINGS: Cardiomediastinal silhouette is stable. No acute infiltrate or pleural effusion. No pulmonary edema. There is linear atelectasis or scarring  in left upper lobe. Degenerative changes right AC joint. IMPRESSION: No infiltrate or pulmonary edema. Linear atelectasis or scarring left upper lobe. Electronically Signed   By: Lahoma Crocker M.D.   On: 06/01/2015 09:34    Time Spent in minutes  25 minutes   ELGERGAWY, DAWOOD M.D on 06/04/2015 at 1:15 PM  Between 7am to 7pm - Pager - [phone]  After 7pm go to [email or web address] - password [facility]  [facility] -  Office  [phone]

## 2015-06-05 LAB — GLUCOSE, CAPILLARY
GLUCOSE-CAPILLARY: 171 mg/dL — AB (ref 65–99)
Glucose-Capillary: 158 mg/dL — ABNORMAL HIGH (ref 65–99)

## 2015-06-05 MED ORDER — IPRATROPIUM-ALBUTEROL 0.5-2.5 (3) MG/3ML IN SOLN: 3.0000 mL | Freq: Four times a day (QID) | RESPIRATORY_TRACT | Status: DC | PRN

## 2015-06-05 MED ORDER — GUAIFENESIN ER 600 MG PO TB12: 1200.0000 mg | ORAL_TABLET | Freq: Two times a day (BID) | ORAL | Status: DC

## 2015-06-05 MED ORDER — PREDNISONE 10 MG PO TABS: ORAL_TABLET | ORAL | Status: DC

## 2015-06-05 NOTE — Progress Notes (Signed)
Nsg Discharge Note  Admit Date:  06/01/2015 Discharge date: 06/05/2015   [name] to be D/C'd Home per MD order.  AVS completed.  Copy for chart, and copy for patient signed, and dated. Patient/caregiver able to verbalize understanding.  Discharge Medication:   Medication List    STOP taking these medications        omeprazole 20 MG capsule  Commonly known as:  PRILOSEC      TAKE these medications        AEROCHAMBER MV inhaler  Use as instructed     albuterol 108 (90 Base) MCG/ACT inhaler  Commonly known as:  PROVENTIL HFA  Inhale 2 puffs into the lungs 2 (two) times daily.     amLODipine 5 MG tablet  Commonly known as:  NORVASC  TAKE 1 TABLET (5 MG TOTAL) BY MOUTH DAILY.     aspirin 81 MG tablet  Take 81 mg by mouth daily.     bisoprolol 10 MG tablet  Commonly known as:  ZEBETA  Take 0.5 tablets (5 mg total) by mouth daily.     budesonide-formoterol 160-4.5 MCG/ACT inhaler  Commonly known as:  SYMBICORT  Inhale 2 puffs into the lungs 2 (two) times daily.     guaiFENesin 600 MG 12 hr tablet  Commonly known as:  MUCINEX  Take 2 tablets (1,200 mg total) by mouth 2 (two) times daily.     ipratropium-albuterol 0.5-2.5 (3) MG/3ML Soln  Commonly known as:  DUONEB  Take 3 mLs by nebulization every 6 (six) hours as needed.     losartan 50 MG tablet  Commonly known as:  COZAAR  Take 1 tablet (50 mg total) by mouth daily.     metFORMIN 500 MG tablet  Commonly known as:  GLUCOPHAGE  Take by mouth 2 (two) times daily with a meal.     nitroGLYCERIN 0.4 MG SL tablet  Commonly known as:  NITROSTAT  Place 1 tablet (0.4 mg total) under the tongue every 5 (five) minutes as needed for chest pain.     pantoprazole 40 MG tablet  Commonly known as:  PROTONIX  Take 40 mg by mouth daily.     potassium chloride SA 20 MEQ tablet  Commonly known as:  KLOR-CON M20  Take 1 tablet (20 mEq total) by mouth daily.     predniSONE 10 MG tablet  Commonly known as:  DELTASONE   Please take 50 mg daily for 3 days, then 40 mg daily for 2 days, then 30 mg daily for 2 days, then 20 mg daily for 2 days, then 10 mg daily for 2 days then stop.     rosuvastatin 20 MG tablet  Commonly known as:  CRESTOR  Take 20 mg daily for elevated cholesterol     tiotropium 18 MCG inhalation capsule  Commonly known as:  SPIRIVA HANDIHALER  Place 1 capsule (18 mcg total) into inhaler and inhale daily.        Discharge Assessment: Filed Vitals:   06/04/15 2203 06/05/15 0511  BP: 114/56 142/83  Pulse: 84 82  Temp: 98.1 F (36.7 C) 97.8 F (36.6 C)  Resp: 18 18   Skin clean, dry and intact without evidence of skin break down, no evidence of skin tears noted. IV catheter discontinued intact. Site without signs and symptoms of complications - no redness or edema noted at insertion site, patient denies c/o pain - only slight tenderness at site.  Dressing with slight pressure applied.  D/c Instructions-Education: Discharge instructions  given to patient/family with verbalized understanding. D/c education completed with patient/family including follow up instructions, medication list, d/c activities limitations if indicated, with other d/c instructions as indicated by MD - patient able to verbalize understanding, all questions fully answered. Patient instructed to return to ED, call 911, or call MD for any changes in condition.  Patient escorted via [place], and D/C home via private auto.  Dayle Points, RN 06/05/2015 1:15 PM

## 2015-06-05 NOTE — Discharge Summary (Signed)
[name], is a 70 y.o. male  DOB January 11, 1945  MRN [id number].  Admission date:  06/01/2015  Admitting Physician  Waldemar Dickens, MD  Discharge Date:  06/05/2015   Primary MD  [name], MD  Recommendations for primary care physician for things to follow:  - Please check CBC, BMP, 2 view chest x-ray during next visit. - Ambulatory referral to pulmonary rehabilitation .   Admission Diagnosis  COPD exacerbation ([place]) [J44.1]   Discharge Diagnosis  COPD exacerbation ([place]) [J44.1]    Active Problems:   GERD   Hyperlipidemia   Essential hypertension   COPD exacerbation (HCC)   Diabetes mellitus, type 2 ([place])   Acute on chronic respiratory failure with hypercapnia (HCC)   Diabetes mellitus with complication [facility])      Past Medical History  Diagnosis Date  . COPD (chronic obstructive pulmonary disease) ([place])     former smoker  . GERD (gastroesophageal reflux disease)   . CAD (coronary artery disease), native coronary artery 6/11, 2/14, 6/14; 7/15    a. thought due to vasospasm . cath - CAD LAD 40-50%, LCX 40%, RCA 25% EF 65%. lexiscan myoview EF 68% w/o evidence of ischemia or infarct b. STEMI s/p BMS-mid RCA c. BMS x 2- prox RCA, PLOM  . HTN (hypertension)   . Asthma   . Hyperlipidemia   . Tobacco abuse   . Allergic rhinitis   . Shortness of breath dyspnea   . Diabetes mellitus without complication ([place])     type 2    Past Surgical History  Procedure Laterality Date  . Left foot surgery      repair - and ankle   . Coronary angioplasty with stent placement  02/15/12    20% distal LM, 30-40% mid LAD, 50% ostial diag, 50-60% mid LCx, 99% focal mid RCA stenosis s/p BMS; 20% distal RCA disease, 50% prox PLB; LVEF 55-65%  . Coronary angioplasty with stent placement  07/01/12    30% prox LAD, 30% mid LAD, 20% mid LCx, 90% prox RCA s/p BMS, patent mid RCA stent, 30-40% distal RCA, 80% PLB s/p  BMS; EF 55-65%  . Cardiac catheterization  08/03/13    non obstructive disease LCX 20%; LAD 20%; stents in prox RCA and mid are patent, mild in stent stenosis POM, 30%, normal EF 55-65%  . Left heart catheterization with coronary angiogram N/A 02/15/2012    Procedure: LEFT HEART CATHETERIZATION WITH CORONARY ANGIOGRAM;  Surgeon: Peter M [name], MD;  Location: [facility] CATH LAB;  Service: Cardiovascular;  Laterality: N/A;  . Percutaneous coronary stent intervention (pci-s) N/A 02/15/2012    Procedure: PERCUTANEOUS CORONARY STENT INTERVENTION (PCI-S);  Surgeon: Peter M [name], MD;  Location: [facility] CATH LAB;  Service: Cardiovascular;  Laterality: N/A;  . Left heart catheterization with coronary angiogram N/A 07/01/2012    Procedure: LEFT HEART CATHETERIZATION WITH CORONARY ANGIOGRAM;  Surgeon: Peter M [name], MD;  Location: [facility] CATH LAB;  Service: Cardiovascular;  Laterality: N/A;  . Percutaneous coronary stent intervention (pci-s)  07/01/2012  Procedure: PERCUTANEOUS CORONARY STENT INTERVENTION (PCI-S);  Surgeon: Peter M [name], MD;  Location: [facility] CATH LAB;  Service: Cardiovascular;;  . Left heart catheterization with coronary angiogram N/A 08/03/2013    Procedure: LEFT HEART CATHETERIZATION WITH CORONARY ANGIOGRAM;  Surgeon: Peter M [name], MD;  Location: [facility] CATH LAB;  Service: Cardiovascular;  Laterality: N/A;  . Cardiac catheterization N/A 12/27/2014    Procedure: Left Heart Cath and Coronary Angiography;  Surgeon: Jettie Booze, MD;  Location: [facility] CV LAB;  Service: Cardiovascular;  Laterality: N/A;       History of present illness and  Hospital Course:     Kindly see H&P for history of present illness and admission details, please review complete Labs, Consult reports and Test reports for all details in brief  HPI  from the history and physical done on the day of admission 05/31/2012 HPI: [name] is a 70 y.o. male with medical history significant for, but not necessarily limited to,  coronary artery disease s/p BMS 2014, hypertension, and COPD. Patient brought to ED by EMS for SOB / wheezing and intermittent chest pain. He was given ativan and continuous albuterol nebulizer in route to ED without improvement. Doesn't wear 02 at home but always short of breath to some degree. Complaint with inhalers at home, not smoking. Over last two days SOB has been much worse. No orthopnea. Occas cough with sputum production. started two days ago.   ED Course:  Continuous albuterol treatment, IV ativan   Hospital Course   COPD exacerbation ([place]) with hypercarbic respiratory failure.  - Chest x-ray with no evidence of pneumonia or volume overload - Significant wheezing and diminished air entry on physical exam on admission. - Treated with IV steroids with significant improvement of symptoms, taper down gradually , as well treated with IV levofloxacin given his productive cough , treated with pulmonary toilet, nebs, flutter valve , will be discharged on by mouth steroid taper dose , continue home medication including Symbicort and spiriva. - No further hypoxia, 96% on ambulation on room air - Continue with pulmonary toilet, nebs, flutter valve - Leukocytosis most likely related to steroids, afebrile - We'll discharge on DuoNeb nebs when necessary, case management to arrange for nebulizer at home.  CAD/ hx of stent placement.  - He does have chronic, intermittent chest pain and complained of chest pain in the field but not on my interview today. He describes postprandial abdominal bloating. No EKG changes - Patient with complaint of chest pain yesterday evening, troponin initially 0.13>0.03>0.05, EKG nonacute, D/W cardiology Dr Domenic Polite, given no recurrence of chest pain, patient is currently stable, no further workup indicated at this point, we'll need to follow with Dr. [name] as an outpatient. -continue home SL nitro prn -continue home asa, statin  Diastolic dysfunction,  - grade I  on echo Dec 2016. No evidence for volume overload.   Diabetes mellitus type 2 .  - A1c one year ago - 5.8, repeat is 7.9 this admission -Continue with metformin on discharge  Hypertension,  -continue home Norvasc , zebeta, cozaar  Hyperlipidemia -continue home statin  GERD, stable.  Continue home PPI   Constipation. -Miralax daily  Weakness.  Seen by PT, recommendation is for cardiopulmonary rehabilitation, ambulatory referral for pulmonary rehabilitation was done      Discharge Condition:  Stable   Follow UP  Follow-up Information    Follow up with [name], MD. Schedule an appointment as soon as possible for a visit in 1 week.  Specialty:  Internal Medicine   Contact information:   [place] [address] [phone]       Follow up with Peter [name], MD. Schedule an appointment as soon as possible for a visit in 1 week.   Specialty:  Cardiology   Contact information:   [address] [place] [address] [phone]         Discharge Instructions  and  Discharge Medications        Discharge Instructions    AMB referral to pulmonary rehabilitation    Complete by:  As directed   Please select a program:  Pulmonary Rehabilitation (COPD)  COPD Diagnosis: (See requirements below):  COPD-Gold 3  Program Prescription:  O2 Administration by RT, EP, or RN if SpO2<88%     Discharge instructions    Complete by:  As directed   Follow with Primary MD [name], MD in 7 days   Get CBC, CMP, 2 view Chest X ray checked  by Primary MD next visit.    Activity: As tolerated with Full fall precautions use walker/cane & assistance as needed   Disposition Home    Diet: Heart Healthy , carbohydrate modified , with feeding assistance and aspiration precautions.  For Heart failure patients - Check your Weight same time everyday, if you gain over 2 pounds, or you develop in leg  swelling, experience more shortness of breath or chest pain, call your Primary MD immediately. Follow Cardiac Low Salt Diet and 1.5 lit/day fluid restriction.   On your next visit with your primary care physician please Get Medicines reviewed and adjusted.   Please request your Prim.MD to go over all Hospital Tests and Procedure/Radiological results at the follow up, please get all Hospital records sent to your Prim MD by signing hospital release before you go home.   If you experience worsening of your admission symptoms, develop shortness of breath, life threatening emergency, suicidal or homicidal thoughts you must seek medical attention immediately by calling 911 or calling your MD immediately  if symptoms less severe.  You Must read complete instructions/literature along with all the possible adverse reactions/side effects for all the Medicines you take and that have been prescribed to you. Take any new Medicines after you have completely understood and accpet all the possible adverse reactions/side effects.   Do not drive, operating heavy machinery, perform activities at heights, swimming or participation in water activities or provide baby sitting services if your were admitted for syncope or siezures until you have seen by Primary MD or a Neurologist and advised to do so again.  Do not drive when taking Pain medications.    Do not take more than prescribed Pain, Sleep and Anxiety Medications  Special Instructions: If you have smoked or chewed Tobacco  in the last 2 yrs please stop smoking, stop any regular Alcohol  and or any Recreational drug use.  Wear Seat belts while driving.   Please note  You were cared for by a hospitalist during your hospital stay. If you have any questions about your discharge medications or the care you received while you were in the hospital after you are discharged, you can call the unit and asked to speak with the hospitalist on call if the hospitalist  that took care of you is not available. Once you are discharged, your primary care physician will handle any further medical issues. Please note that NO REFILLS for any discharge medications will be authorized once you are  discharged, as it is imperative that you return to your primary care physician (or establish a relationship with a primary care physician if you do not have one) for your aftercare needs so that they can reassess your need for medications and monitor your lab values.     Increase activity slowly    Complete by:  As directed             Medication List    STOP taking these medications        omeprazole 20 MG capsule  Commonly known as:  PRILOSEC      TAKE these medications        AEROCHAMBER MV inhaler  Use as instructed     albuterol 108 (90 Base) MCG/ACT inhaler  Commonly known as:  PROVENTIL HFA  Inhale 2 puffs into the lungs 2 (two) times daily.     amLODipine 5 MG tablet  Commonly known as:  NORVASC  TAKE 1 TABLET (5 MG TOTAL) BY MOUTH DAILY.     aspirin 81 MG tablet  Take 81 mg by mouth daily.     bisoprolol 10 MG tablet  Commonly known as:  ZEBETA  Take 0.5 tablets (5 mg total) by mouth daily.     budesonide-formoterol 160-4.5 MCG/ACT inhaler  Commonly known as:  SYMBICORT  Inhale 2 puffs into the lungs 2 (two) times daily.     guaiFENesin 600 MG 12 hr tablet  Commonly known as:  MUCINEX  Take 2 tablets (1,200 mg total) by mouth 2 (two) times daily.     ipratropium-albuterol 0.5-2.5 (3) MG/3ML Soln  Commonly known as:  DUONEB  Take 3 mLs by nebulization every 6 (six) hours as needed.     losartan 50 MG tablet  Commonly known as:  COZAAR  Take 1 tablet (50 mg total) by mouth daily.     metFORMIN 500 MG tablet  Commonly known as:  GLUCOPHAGE  Take by mouth 2 (two) times daily with a meal.     nitroGLYCERIN 0.4 MG SL tablet  Commonly known as:  NITROSTAT  Place 1 tablet (0.4 mg total) under the tongue every 5 (five) minutes as needed for  chest pain.     pantoprazole 40 MG tablet  Commonly known as:  PROTONIX  Take 40 mg by mouth daily.     potassium chloride SA 20 MEQ tablet  Commonly known as:  KLOR-CON M20  Take 1 tablet (20 mEq total) by mouth daily.     predniSONE 10 MG tablet  Commonly known as:  DELTASONE  Please take 50 mg daily for 3 days, then 40 mg daily for 2 days, then 30 mg daily for 2 days, then 20 mg daily for 2 days, then 10 mg daily for 2 days then stop.     rosuvastatin 20 MG tablet  Commonly known as:  CRESTOR  Take 20 mg daily for elevated cholesterol     tiotropium 18 MCG inhalation capsule  Commonly known as:  SPIRIVA HANDIHALER  Place 1 capsule (18 mcg total) into inhaler and inhale daily.          Diet and Activity recommendation: See Discharge Instructions above   Consults obtained -  None   Major procedures and Radiology Reports - PLEASE review detailed and final reports for all details, in brief -      Dg Chest Port 1 View  06/01/2015  CLINICAL DATA:  Shortness of Breath EXAM: PORTABLE CHEST 1 VIEW COMPARISON:  12/23/2014 FINDINGS:  Cardiomediastinal silhouette is stable. No acute infiltrate or pleural effusion. No pulmonary edema. There is linear atelectasis or scarring in left upper lobe. Degenerative changes right AC joint. IMPRESSION: No infiltrate or pulmonary edema. Linear atelectasis or scarring left upper lobe. Electronically Signed   By: Lahoma Crocker M.D.   On: 06/01/2015 09:34    Micro Results     No results found for this or any previous visit (from the past 240 hour(s)).     Today   Subjective:   [name] today has no headache,no chest or abdominal pain, reports cough is improving, nonproductive , dyspnea at baseline .  Objective:   Blood pressure 142/83, pulse 82, temperature 97.8 F (36.6 C), temperature source Oral, resp. rate 18, height 5\' 2"  (1.575 m), weight 63.504 kg (140 lb), SpO2 95 %.   Intake/Output Summary (Last 24 hours) at 06/05/15  1219 Last data filed at 06/05/15 0900  Gross per 24 hour  Intake    600 ml  Output      0 ml  Net    600 ml    Exam Awake Alert, Oriented x 3, No new F.N deficits, Normal affect [place].AT,PERRAL Supple Neck,No JVD, No cervical lymphadenopathy appriciated.  Symmetrical Chest wall movement,Improved  air movement bilaterally,scattered wheezing  RRR,No Gallops,Rubs or new Murmurs, No Parasternal Heave +ve B.Sounds, Abd Soft, Non tender, No organomegaly appriciated, No rebound -guarding or rigidity. No Cyanosis, Clubbing or edema, No new Rash or bruise  Data Review   CBC w Diff:  Lab Results  Component Value Date   WBC 18.7* 06/03/2015   HGB 13.8 06/03/2015   HCT 43.2 06/03/2015   PLT 286 06/03/2015   LYMPHOPCT 18 06/01/2015   MONOPCT 13 06/01/2015   EOSPCT 1 06/01/2015   BASOPCT 0 06/01/2015    CMP:  Lab Results  Component Value Date   NA 135 06/04/2015   K 4.7 06/04/2015   CL 100* 06/04/2015   CO2 25 06/04/2015   BUN 32* 06/04/2015   CREATININE 1.03 06/04/2015   CREATININE 0.75 04/08/2015   PROT 5.9* 04/08/2015   ALBUMIN 4.0 04/08/2015   BILITOT 0.4 04/08/2015   ALKPHOS 56 04/08/2015   AST 20 04/08/2015   ALT 39 04/08/2015  .   Total Time in preparing paper work, data evaluation and todays exam - 35 minutes  Raelle Chambers M.D on 06/05/2015 at 12:19 PM  [facility]   Office  [phone]

## 2015-06-05 NOTE — Progress Notes (Signed)
   Patient Saturations on Room Air at Rest = 96%  Patient Saturations on [employer] while Ambulating = 96%

## 2015-06-05 NOTE — Discharge Instructions (Signed)
Follow with Primary MD [name], MD in 7 days   Get CBC, CMP, 2 view Chest X ray checked  by Primary MD next visit.    Activity: As tolerated with Full fall precautions use walker/cane & assistance as needed   Disposition Home    Diet: Heart Healthy , carbohydrate modified , with feeding assistance and aspiration precautions.  For Heart failure patients - Check your Weight same time everyday, if you gain over 2 pounds, or you develop in leg swelling, experience more shortness of breath or chest pain, call your Primary MD immediately. Follow Cardiac Low Salt Diet and 1.5 lit/day fluid restriction.   On your next visit with your primary care physician please Get Medicines reviewed and adjusted.   Please request your Prim.MD to go over all Hospital Tests and Procedure/Radiological results at the follow up, please get all Hospital records sent to your Prim MD by signing hospital release before you go home.   If you experience worsening of your admission symptoms, develop shortness of breath, life threatening emergency, suicidal or homicidal thoughts you must seek medical attention immediately by calling 911 or calling your MD immediately  if symptoms less severe.  You Must read complete instructions/literature along with all the possible adverse reactions/side effects for all the Medicines you take and that have been prescribed to you. Take any new Medicines after you have completely understood and accpet all the possible adverse reactions/side effects.   Do not drive, operating heavy machinery, perform activities at heights, swimming or participation in water activities or provide baby sitting services if your were admitted for syncope or siezures until you have seen by Primary MD or a Neurologist and advised to do so again.  Do not drive when taking Pain medications.    Do not take more than prescribed Pain, Sleep and Anxiety Medications  Special Instructions: If you have smoked  or chewed Tobacco  in the last 2 yrs please stop smoking, stop any regular Alcohol  and or any Recreational drug use.  Wear Seat belts while driving.   Please note  You were cared for by a hospitalist during your hospital stay. If you have any questions about your discharge medications or the care you received while you were in the hospital after you are discharged, you can call the unit and asked to speak with the hospitalist on call if the hospitalist that took care of you is not available. Once you are discharged, your primary care physician will handle any further medical issues. Please note that NO REFILLS for any discharge medications will be authorized once you are discharged, as it is imperative that you return to your primary care physician (or establish a relationship with a primary care physician if you do not have one) for your aftercare needs so that they can reassess your need for medications and monitor your lab values.

## 2015-06-05 NOTE — Care Management Note (Signed)
Case Management Note  Patient Details  Name: Oree Tomaino MRN: [id number] Date of Birth: 01-17-1945  Subjective/Objective:                  COPD exacerbation  Action/Plan: CM spoke with patient who states he has a nebulizer machine at home. He does not have the medications. Patient's nurse, Joellen Jersey, confirmed with the patient, he does have a machine at home. Patient informed he will receive his prescriptions at discharge.   Expected Discharge Date:    06/05/15              Expected Discharge Plan:  Home/Self Care  In-House Referral:     Discharge planning Services  CM Consult  Post Acute Care Choice:    Choice offered to:     DME Arranged:    DME Agency:     HH Arranged:    HH Agency:     Status of Service:  Completed, signed off  Medicare Important Message Given:  Yes Date Medicare IM Given:    Medicare IM give by:    Date Additional Medicare IM Given:    Additional Medicare Important Message give by:     If discussed at [place] of Stay Meetings, dates discussed:    Additional Comments:  Apolonio Schneiders, RN 06/05/2015, 12:51 PM

## 2015-06-07 ENCOUNTER — Other Ambulatory Visit: Payer: Self-pay | Admitting: Cardiology

## 2015-06-07 NOTE — Telephone Encounter (Signed)
Rx has been sent to the pharmacy electronically. ° °

## 2015-06-09 ENCOUNTER — Telehealth: Payer: Self-pay | Admitting: Pulmonary Disease

## 2015-06-09 ENCOUNTER — Other Ambulatory Visit: Payer: Self-pay | Admitting: *Deleted

## 2015-06-09 DIAGNOSIS — J432 Centrilobular emphysema: Secondary | ICD-10-CM

## 2015-06-09 NOTE — Telephone Encounter (Signed)
Received a call from [facility]. States that the order for pulmonary rehab was placed with a dx code of COPD. Pt does not qualify for pulmonary rehab with that dx code. Another code will have to picked.  BQ - please advise. Thanks.

## 2015-06-09 NOTE — Telephone Encounter (Signed)
New order has been placed with new dx code. [name] is aware. Nothing further was needed.

## 2015-06-09 NOTE — Telephone Encounter (Signed)
Centrilobular emphysema

## 2015-06-10 DIAGNOSIS — E784 Other hyperlipidemia: Secondary | ICD-10-CM | POA: Diagnosis not present

## 2015-06-10 DIAGNOSIS — I10 Essential (primary) hypertension: Secondary | ICD-10-CM | POA: Diagnosis not present

## 2015-06-10 DIAGNOSIS — I2510 Atherosclerotic heart disease of native coronary artery without angina pectoris: Secondary | ICD-10-CM | POA: Diagnosis not present

## 2015-06-10 DIAGNOSIS — K5900 Constipation, unspecified: Secondary | ICD-10-CM | POA: Diagnosis not present

## 2015-06-10 DIAGNOSIS — J449 Chronic obstructive pulmonary disease, unspecified: Secondary | ICD-10-CM | POA: Diagnosis not present

## 2015-06-10 DIAGNOSIS — E1165 Type 2 diabetes mellitus with hyperglycemia: Secondary | ICD-10-CM | POA: Diagnosis not present

## 2015-06-28 ENCOUNTER — Other Ambulatory Visit: Payer: Self-pay | Admitting: Cardiology

## 2015-08-08 DIAGNOSIS — J449 Chronic obstructive pulmonary disease, unspecified: Secondary | ICD-10-CM | POA: Diagnosis not present

## 2015-08-08 DIAGNOSIS — I10 Essential (primary) hypertension: Secondary | ICD-10-CM | POA: Diagnosis not present

## 2015-08-08 DIAGNOSIS — M179 Osteoarthritis of knee, unspecified: Secondary | ICD-10-CM | POA: Diagnosis not present

## 2015-08-08 DIAGNOSIS — E1165 Type 2 diabetes mellitus with hyperglycemia: Secondary | ICD-10-CM | POA: Diagnosis not present

## 2015-08-08 DIAGNOSIS — Z23 Encounter for immunization: Secondary | ICD-10-CM | POA: Diagnosis not present

## 2015-08-11 DIAGNOSIS — Z961 Presence of intraocular lens: Secondary | ICD-10-CM | POA: Diagnosis not present

## 2015-08-15 ENCOUNTER — Other Ambulatory Visit: Payer: Self-pay | Admitting: Cardiology

## 2015-08-15 MED ORDER — ROSUVASTATIN CALCIUM 20 MG PO TABS: ORAL_TABLET | ORAL | 3 refills | Status: DC

## 2015-08-18 DIAGNOSIS — M159 Polyosteoarthritis, unspecified: Secondary | ICD-10-CM | POA: Diagnosis not present

## 2015-09-21 DIAGNOSIS — J449 Chronic obstructive pulmonary disease, unspecified: Secondary | ICD-10-CM | POA: Diagnosis not present

## 2015-09-21 DIAGNOSIS — E1165 Type 2 diabetes mellitus with hyperglycemia: Secondary | ICD-10-CM | POA: Diagnosis not present

## 2015-09-21 DIAGNOSIS — I10 Essential (primary) hypertension: Secondary | ICD-10-CM | POA: Diagnosis not present

## 2015-09-21 DIAGNOSIS — M179 Osteoarthritis of knee, unspecified: Secondary | ICD-10-CM | POA: Diagnosis not present

## 2015-09-21 DIAGNOSIS — Z23 Encounter for immunization: Secondary | ICD-10-CM | POA: Diagnosis not present

## 2015-11-02 DIAGNOSIS — M179 Osteoarthritis of knee, unspecified: Secondary | ICD-10-CM | POA: Diagnosis not present

## 2015-11-02 DIAGNOSIS — J449 Chronic obstructive pulmonary disease, unspecified: Secondary | ICD-10-CM | POA: Diagnosis not present

## 2015-11-02 DIAGNOSIS — E1165 Type 2 diabetes mellitus with hyperglycemia: Secondary | ICD-10-CM | POA: Diagnosis not present

## 2015-11-02 DIAGNOSIS — I2510 Atherosclerotic heart disease of native coronary artery without angina pectoris: Secondary | ICD-10-CM | POA: Diagnosis not present

## 2015-11-02 DIAGNOSIS — I10 Essential (primary) hypertension: Secondary | ICD-10-CM | POA: Diagnosis not present

## 2016-04-26 DIAGNOSIS — E1165 Type 2 diabetes mellitus with hyperglycemia: Secondary | ICD-10-CM | POA: Diagnosis not present

## 2016-04-26 DIAGNOSIS — Z13220 Encounter for screening for lipoid disorders: Secondary | ICD-10-CM | POA: Diagnosis not present

## 2016-04-26 DIAGNOSIS — J449 Chronic obstructive pulmonary disease, unspecified: Secondary | ICD-10-CM | POA: Diagnosis not present

## 2016-04-26 DIAGNOSIS — I10 Essential (primary) hypertension: Secondary | ICD-10-CM | POA: Diagnosis not present

## 2016-04-26 DIAGNOSIS — N410 Acute prostatitis: Secondary | ICD-10-CM | POA: Diagnosis not present

## 2016-04-30 ENCOUNTER — Telehealth: Payer: Self-pay

## 2016-04-30 NOTE — Telephone Encounter (Signed)
There was no answer nor option to leave a message on the patient's mobile number. [name] ([place])

## 2016-04-30 NOTE — Telephone Encounter (Signed)
2nd attempt to contact patient and confirm PCP. (1st attempt was 03/07/16).  There was no answer and no option to leave a message. [name] ([place])

## 2016-05-10 DIAGNOSIS — J449 Chronic obstructive pulmonary disease, unspecified: Secondary | ICD-10-CM | POA: Diagnosis not present

## 2016-05-10 DIAGNOSIS — I10 Essential (primary) hypertension: Secondary | ICD-10-CM | POA: Diagnosis not present

## 2016-05-10 DIAGNOSIS — E1165 Type 2 diabetes mellitus with hyperglycemia: Secondary | ICD-10-CM | POA: Diagnosis not present

## 2016-05-10 DIAGNOSIS — N3281 Overactive bladder: Secondary | ICD-10-CM | POA: Diagnosis not present

## 2016-06-05 ENCOUNTER — Other Ambulatory Visit: Payer: Self-pay | Admitting: Cardiology

## 2016-06-12 DIAGNOSIS — J449 Chronic obstructive pulmonary disease, unspecified: Secondary | ICD-10-CM | POA: Diagnosis not present

## 2016-06-12 DIAGNOSIS — N3281 Overactive bladder: Secondary | ICD-10-CM | POA: Diagnosis not present

## 2016-06-12 DIAGNOSIS — I10 Essential (primary) hypertension: Secondary | ICD-10-CM | POA: Diagnosis not present

## 2016-06-12 DIAGNOSIS — E1165 Type 2 diabetes mellitus with hyperglycemia: Secondary | ICD-10-CM | POA: Diagnosis not present

## 2016-07-24 DIAGNOSIS — N3281 Overactive bladder: Secondary | ICD-10-CM | POA: Diagnosis not present

## 2016-07-24 DIAGNOSIS — E1165 Type 2 diabetes mellitus with hyperglycemia: Secondary | ICD-10-CM | POA: Diagnosis not present

## 2016-07-24 DIAGNOSIS — I10 Essential (primary) hypertension: Secondary | ICD-10-CM | POA: Diagnosis not present

## 2016-07-24 DIAGNOSIS — N3081 Other cystitis with hematuria: Secondary | ICD-10-CM | POA: Diagnosis not present

## 2016-07-24 DIAGNOSIS — J449 Chronic obstructive pulmonary disease, unspecified: Secondary | ICD-10-CM | POA: Diagnosis not present

## 2016-08-03 ENCOUNTER — Inpatient Hospital Stay (HOSPITAL_COMMUNITY)
Admission: EM | Admit: 2016-08-03 | Discharge: 2016-08-08 | DRG: 871 | Disposition: A | Discharge status: Home / Self Care | Payer: Medicare HMO | Attending: Internal Medicine | Admitting: Internal Medicine

## 2016-08-03 ENCOUNTER — Emergency Department (HOSPITAL_COMMUNITY): Payer: Medicare HMO

## 2016-08-03 ENCOUNTER — Encounter (HOSPITAL_COMMUNITY): Payer: Self-pay | Admitting: *Deleted

## 2016-08-03 ENCOUNTER — Inpatient Hospital Stay (HOSPITAL_COMMUNITY): Payer: Medicare HMO

## 2016-08-03 DIAGNOSIS — I361 Nonrheumatic tricuspid (valve) insufficiency: Secondary | ICD-10-CM | POA: Diagnosis not present

## 2016-08-03 DIAGNOSIS — I213 ST elevation (STEMI) myocardial infarction of unspecified site: Secondary | ICD-10-CM

## 2016-08-03 DIAGNOSIS — R0902 Hypoxemia: Secondary | ICD-10-CM | POA: Diagnosis present

## 2016-08-03 DIAGNOSIS — R1084 Generalized abdominal pain: Secondary | ICD-10-CM | POA: Diagnosis not present

## 2016-08-03 DIAGNOSIS — R1030 Lower abdominal pain, unspecified: Secondary | ICD-10-CM | POA: Diagnosis not present

## 2016-08-03 DIAGNOSIS — N401 Enlarged prostate with lower urinary tract symptoms: Secondary | ICD-10-CM | POA: Diagnosis not present

## 2016-08-03 DIAGNOSIS — I700 Atherosclerosis of aorta: Secondary | ICD-10-CM

## 2016-08-03 DIAGNOSIS — E784 Other hyperlipidemia: Secondary | ICD-10-CM | POA: Diagnosis not present

## 2016-08-03 DIAGNOSIS — K210 Gastro-esophageal reflux disease with esophagitis: Secondary | ICD-10-CM | POA: Diagnosis not present

## 2016-08-03 DIAGNOSIS — R300 Dysuria: Secondary | ICD-10-CM | POA: Diagnosis present

## 2016-08-03 DIAGNOSIS — I2119 ST elevation (STEMI) myocardial infarction involving other coronary artery of inferior wall: Secondary | ICD-10-CM | POA: Diagnosis present

## 2016-08-03 DIAGNOSIS — I2510 Atherosclerotic heart disease of native coronary artery without angina pectoris: Secondary | ICD-10-CM | POA: Diagnosis present

## 2016-08-03 DIAGNOSIS — J449 Chronic obstructive pulmonary disease, unspecified: Secondary | ICD-10-CM | POA: Diagnosis present

## 2016-08-03 DIAGNOSIS — J189 Pneumonia, unspecified organism: Secondary | ICD-10-CM | POA: Diagnosis not present

## 2016-08-03 DIAGNOSIS — E785 Hyperlipidemia, unspecified: Secondary | ICD-10-CM | POA: Diagnosis present

## 2016-08-03 DIAGNOSIS — Z87891 Personal history of nicotine dependence: Secondary | ICD-10-CM

## 2016-08-03 DIAGNOSIS — E876 Hypokalemia: Secondary | ICD-10-CM | POA: Diagnosis present

## 2016-08-03 DIAGNOSIS — D72829 Elevated white blood cell count, unspecified: Secondary | ICD-10-CM | POA: Diagnosis not present

## 2016-08-03 DIAGNOSIS — Z7951 Long term (current) use of inhaled steroids: Secondary | ICD-10-CM | POA: Diagnosis not present

## 2016-08-03 DIAGNOSIS — I252 Old myocardial infarction: Secondary | ICD-10-CM

## 2016-08-03 DIAGNOSIS — I10 Essential (primary) hypertension: Secondary | ICD-10-CM | POA: Diagnosis present

## 2016-08-03 DIAGNOSIS — R109 Unspecified abdominal pain: Secondary | ICD-10-CM

## 2016-08-03 DIAGNOSIS — E1165 Type 2 diabetes mellitus with hyperglycemia: Secondary | ICD-10-CM | POA: Diagnosis present

## 2016-08-03 DIAGNOSIS — I110 Hypertensive heart disease with heart failure: Secondary | ICD-10-CM | POA: Diagnosis present

## 2016-08-03 DIAGNOSIS — E118 Type 2 diabetes mellitus with unspecified complications: Secondary | ICD-10-CM | POA: Diagnosis present

## 2016-08-03 DIAGNOSIS — J181 Lobar pneumonia, unspecified organism: Secondary | ICD-10-CM | POA: Diagnosis not present

## 2016-08-03 DIAGNOSIS — J90 Pleural effusion, not elsewhere classified: Secondary | ICD-10-CM | POA: Diagnosis not present

## 2016-08-03 DIAGNOSIS — E872 Acidosis: Secondary | ICD-10-CM | POA: Diagnosis not present

## 2016-08-03 DIAGNOSIS — J441 Chronic obstructive pulmonary disease with (acute) exacerbation: Secondary | ICD-10-CM | POA: Diagnosis present

## 2016-08-03 DIAGNOSIS — Z7982 Long term (current) use of aspirin: Secondary | ICD-10-CM

## 2016-08-03 DIAGNOSIS — R079 Chest pain, unspecified: Secondary | ICD-10-CM | POA: Diagnosis not present

## 2016-08-03 DIAGNOSIS — I5032 Chronic diastolic (congestive) heart failure: Secondary | ICD-10-CM | POA: Diagnosis present

## 2016-08-03 DIAGNOSIS — R319 Hematuria, unspecified: Secondary | ICD-10-CM

## 2016-08-03 DIAGNOSIS — R350 Frequency of micturition: Secondary | ICD-10-CM | POA: Diagnosis not present

## 2016-08-03 DIAGNOSIS — J439 Emphysema, unspecified: Secondary | ICD-10-CM

## 2016-08-03 DIAGNOSIS — J440 Chronic obstructive pulmonary disease with acute lower respiratory infection: Secondary | ICD-10-CM | POA: Diagnosis present

## 2016-08-03 DIAGNOSIS — E1169 Type 2 diabetes mellitus with other specified complication: Secondary | ICD-10-CM | POA: Diagnosis present

## 2016-08-03 DIAGNOSIS — Z7984 Long term (current) use of oral hypoglycemic drugs: Secondary | ICD-10-CM

## 2016-08-03 DIAGNOSIS — K219 Gastro-esophageal reflux disease without esophagitis: Secondary | ICD-10-CM | POA: Diagnosis present

## 2016-08-03 DIAGNOSIS — A419 Sepsis, unspecified organism: Secondary | ICD-10-CM | POA: Diagnosis not present

## 2016-08-03 HISTORY — DX: Atherosclerosis of aorta: I70.0

## 2016-08-03 HISTORY — DX: Emphysema, unspecified: J43.9

## 2016-08-03 LAB — COMPREHENSIVE METABOLIC PANEL
ALT: 52 U/L (ref 17–63)
AST: 28 U/L (ref 15–41)
Albumin: 3.1 g/dL — ABNORMAL LOW (ref 3.5–5.0)
Alkaline Phosphatase: 64 U/L (ref 38–126)
Anion gap: 8 (ref 5–15)
BUN: 12 mg/dL (ref 6–20)
CHLORIDE: 104 mmol/L (ref 101–111)
CO2: 26 mmol/L (ref 22–32)
CREATININE: 1 mg/dL (ref 0.61–1.24)
Calcium: 8.5 mg/dL — ABNORMAL LOW (ref 8.9–10.3)
Glucose, Bld: 129 mg/dL — ABNORMAL HIGH (ref 65–99)
POTASSIUM: 4 mmol/L (ref 3.5–5.1)
Sodium: 138 mmol/L (ref 135–145)
Total Bilirubin: 0.8 mg/dL (ref 0.3–1.2)
Total Protein: 6.4 g/dL — ABNORMAL LOW (ref 6.5–8.1)

## 2016-08-03 LAB — CBC WITH DIFFERENTIAL/PLATELET
BASOS ABS: 0 10*3/uL (ref 0.0–0.1)
BASOS PCT: 0 %
Eosinophils Absolute: 0.3 10*3/uL (ref 0.0–0.7)
Eosinophils Relative: 2 %
HEMATOCRIT: 43.7 % (ref 39.0–52.0)
Hemoglobin: 14.6 g/dL (ref 13.0–17.0)
Lymphocytes Relative: 9 %
Lymphs Abs: 1.2 10*3/uL (ref 0.7–4.0)
MCH: 29.6 pg (ref 26.0–34.0)
MCHC: 33.4 g/dL (ref 30.0–36.0)
MCV: 88.5 fL (ref 78.0–100.0)
MONO ABS: 1 10*3/uL (ref 0.1–1.0)
Monocytes Relative: 7 %
NEUTROS ABS: 11.1 10*3/uL — AB (ref 1.7–7.7)
Neutrophils Relative %: 82 %
PLATELETS: 291 10*3/uL (ref 150–400)
RBC: 4.94 MIL/uL (ref 4.22–5.81)
RDW: 13.9 % (ref 11.5–15.5)
WBC: 13.6 10*3/uL — ABNORMAL HIGH (ref 4.0–10.5)

## 2016-08-03 LAB — URINALYSIS, ROUTINE W REFLEX MICROSCOPIC
BILIRUBIN URINE: NEGATIVE
GLUCOSE, UA: NEGATIVE mg/dL
Ketones, ur: NEGATIVE mg/dL
NITRITE: NEGATIVE
PH: 5 (ref 5.0–8.0)
Protein, ur: 30 mg/dL — AB
SPECIFIC GRAVITY, URINE: 1.013 (ref 1.005–1.030)

## 2016-08-03 LAB — I-STAT CG4 LACTIC ACID, ED: LACTIC ACID, VENOUS: 2.82 mmol/L — AB (ref 0.5–1.9)

## 2016-08-03 LAB — PROCALCITONIN: Procalcitonin: 0.3 ng/mL

## 2016-08-03 LAB — I-STAT TROPONIN, ED: Troponin i, poc: 0 ng/mL (ref 0.00–0.08)

## 2016-08-03 LAB — PROTIME-INR
INR: 1.08
Prothrombin Time: 14.1 seconds (ref 11.4–15.2)

## 2016-08-03 LAB — GLUCOSE, CAPILLARY
GLUCOSE-CAPILLARY: 206 mg/dL — AB (ref 65–99)
Glucose-Capillary: 213 mg/dL — ABNORMAL HIGH (ref 65–99)

## 2016-08-03 LAB — LACTIC ACID, PLASMA
LACTIC ACID, VENOUS: 2 mmol/L — AB (ref 0.5–1.9)
LACTIC ACID, VENOUS: 1.5 mmol/L (ref 0.5–1.9)

## 2016-08-03 LAB — MRSA PCR SCREENING: MRSA by PCR: NEGATIVE

## 2016-08-03 MED ORDER — METHYLPREDNISOLONE SODIUM SUCC 125 MG IJ SOLR
60.0000 mg | Freq: Four times a day (QID) | INTRAMUSCULAR | Status: DC
  Administered 2016-08-03 – 2016-08-04 (×5): 60 mg via INTRAVENOUS
  Filled 2016-08-03 (×5): qty 2

## 2016-08-03 MED ORDER — PANTOPRAZOLE SODIUM 40 MG PO TBEC
40.0000 mg | DELAYED_RELEASE_TABLET | Freq: Every day | ORAL | Status: DC
  Administered 2016-08-03 – 2016-08-08 (×6): 40 mg via ORAL
  Filled 2016-08-03 (×6): qty 1

## 2016-08-03 MED ORDER — ACETAMINOPHEN 650 MG RE SUPP: 650.0000 mg | Freq: Four times a day (QID) | RECTAL | Status: DC | PRN

## 2016-08-03 MED ORDER — IPRATROPIUM-ALBUTEROL 0.5-2.5 (3) MG/3ML IN SOLN
3.0000 mL | Freq: Four times a day (QID) | RESPIRATORY_TRACT | Status: DC | PRN
  Filled 2016-08-03: qty 3

## 2016-08-03 MED ORDER — GUAIFENESIN ER 600 MG PO TB12
600.0000 mg | ORAL_TABLET | Freq: Two times a day (BID) | ORAL | Status: DC
  Administered 2016-08-03 – 2016-08-06 (×6): 600 mg via ORAL
  Filled 2016-08-03 (×6): qty 1

## 2016-08-03 MED ORDER — GUAIFENESIN ER 600 MG PO TB12
1200.0000 mg | ORAL_TABLET | Freq: Two times a day (BID) | ORAL | Status: DC | PRN
  Administered 2016-08-03: 1200 mg via ORAL
  Filled 2016-08-03: qty 2

## 2016-08-03 MED ORDER — CEFTRIAXONE SODIUM 1 G IJ SOLR
1.0000 g | INTRAMUSCULAR | Status: DC
  Administered 2016-08-04 – 2016-08-08 (×5): 1 g via INTRAVENOUS
  Filled 2016-08-03 (×6): qty 10

## 2016-08-03 MED ORDER — ONDANSETRON HCL 4 MG/2ML IJ SOLN: 4.0000 mg | Freq: Four times a day (QID) | INTRAMUSCULAR | Status: DC | PRN

## 2016-08-03 MED ORDER — TIOTROPIUM BROMIDE MONOHYDRATE 18 MCG IN CAPS
18.0000 ug | ORAL_CAPSULE | Freq: Every day | RESPIRATORY_TRACT | Status: DC
  Administered 2016-08-04: 18 ug via RESPIRATORY_TRACT
  Filled 2016-08-03: qty 5

## 2016-08-03 MED ORDER — ACETAMINOPHEN 325 MG PO TABS
650.0000 mg | ORAL_TABLET | Freq: Four times a day (QID) | ORAL | Status: DC | PRN
  Administered 2016-08-03 – 2016-08-04 (×2): 650 mg via ORAL
  Filled 2016-08-03 (×2): qty 2

## 2016-08-03 MED ORDER — ONDANSETRON HCL 4 MG PO TABS
4.0000 mg | ORAL_TABLET | Freq: Four times a day (QID) | ORAL | Status: DC | PRN
Start: 2016-08-03 — End: 2016-08-08

## 2016-08-03 MED ORDER — AMLODIPINE BESYLATE 10 MG PO TABS
10.0000 mg | ORAL_TABLET | Freq: Every day | ORAL | Status: DC
  Administered 2016-08-04 – 2016-08-08 (×5): 10 mg via ORAL
  Filled 2016-08-03 (×5): qty 1
  Filled 2016-08-03: qty 2

## 2016-08-03 MED ORDER — ACETAMINOPHEN 325 MG PO TABS
650.0000 mg | ORAL_TABLET | Freq: Once | ORAL | Status: AC
  Administered 2016-08-03: 650 mg via ORAL

## 2016-08-03 MED ORDER — TAMSULOSIN HCL 0.4 MG PO CAPS
0.4000 mg | ORAL_CAPSULE | Freq: Every evening | ORAL | Status: DC
  Administered 2016-08-03 – 2016-08-04 (×2): 0.4 mg via ORAL
  Filled 2016-08-03 (×2): qty 1

## 2016-08-03 MED ORDER — SODIUM CHLORIDE 0.9 % IV BOLUS (SEPSIS)
1000.0000 mL | Freq: Once | INTRAVENOUS | Status: AC
  Administered 2016-08-03: 1000 mL via INTRAVENOUS

## 2016-08-03 MED ORDER — SODIUM CHLORIDE 0.9 % IV SOLN: 1000.0000 mL | INTRAVENOUS | Status: DC

## 2016-08-03 MED ORDER — AZITHROMYCIN 250 MG PO TABS
500.0000 mg | ORAL_TABLET | Freq: Once | ORAL | Status: AC
  Administered 2016-08-03: 500 mg via ORAL
  Filled 2016-08-03: qty 2

## 2016-08-03 MED ORDER — ACETAMINOPHEN 325 MG PO TABS
ORAL_TABLET | ORAL | Status: AC
  Filled 2016-08-03: qty 2

## 2016-08-03 MED ORDER — DEXTROSE 5 % IV SOLN
1.0000 g | Freq: Once | INTRAVENOUS | Status: AC
  Administered 2016-08-03: 1 g via INTRAVENOUS
  Filled 2016-08-03: qty 10

## 2016-08-03 MED ORDER — INSULIN ASPART 100 UNIT/ML ~~LOC~~ SOLN
0.0000 [IU] | Freq: Three times a day (TID) | SUBCUTANEOUS | Status: DC
  Administered 2016-08-03: 3 [IU] via SUBCUTANEOUS
  Administered 2016-08-04 (×3): 5 [IU] via SUBCUTANEOUS
  Administered 2016-08-05: 3 [IU] via SUBCUTANEOUS
  Administered 2016-08-05: 5 [IU] via SUBCUTANEOUS

## 2016-08-03 MED ORDER — MOMETASONE FURO-FORMOTEROL FUM 200-5 MCG/ACT IN AERO
2.0000 | INHALATION_SPRAY | Freq: Two times a day (BID) | RESPIRATORY_TRACT | Status: DC
  Administered 2016-08-03 – 2016-08-08 (×9): 2 via RESPIRATORY_TRACT
  Filled 2016-08-03 (×2): qty 8.8

## 2016-08-03 MED ORDER — SENNOSIDES-DOCUSATE SODIUM 8.6-50 MG PO TABS
1.0000 | ORAL_TABLET | Freq: Every evening | ORAL | Status: DC | PRN
  Administered 2016-08-06: 1 via ORAL
  Filled 2016-08-03: qty 1

## 2016-08-03 MED ORDER — METHYLPREDNISOLONE SODIUM SUCC 125 MG IJ SOLR: 60.0000 mg | Freq: Three times a day (TID) | INTRAMUSCULAR | Status: DC

## 2016-08-03 MED ORDER — AZITHROMYCIN 500 MG PO TABS
250.0000 mg | ORAL_TABLET | Freq: Every day | ORAL | Status: DC
  Administered 2016-08-04 – 2016-08-07 (×4): 250 mg via ORAL
  Filled 2016-08-03 (×4): qty 1

## 2016-08-03 MED ORDER — SODIUM CHLORIDE 0.9 % IV SOLN
1000.0000 mL | INTRAVENOUS | Status: DC
  Administered 2016-08-03 – 2016-08-06 (×6): 1000 mL via INTRAVENOUS

## 2016-08-03 MED ORDER — NITROGLYCERIN 0.4 MG SL SUBL: 0.4000 mg | SUBLINGUAL_TABLET | SUBLINGUAL | Status: DC | PRN

## 2016-08-03 MED ORDER — POTASSIUM CHLORIDE CRYS ER 20 MEQ PO TBCR
20.0000 meq | EXTENDED_RELEASE_TABLET | Freq: Every day | ORAL | Status: DC
  Administered 2016-08-04 – 2016-08-08 (×5): 20 meq via ORAL
  Filled 2016-08-03 (×6): qty 1

## 2016-08-03 MED ORDER — BISACODYL 10 MG RE SUPP
10.0000 mg | Freq: Every day | RECTAL | Status: DC | PRN
Start: 2016-08-03 — End: 2016-08-08

## 2016-08-03 NOTE — ED Notes (Signed)
[facility] calling about no fluid resuscitation. Advised that admitting was not ordering fluid resuscitation at this time. Elink states that he would need it. Admitting provider made aware.

## 2016-08-03 NOTE — ED Notes (Signed)
Attempted report 

## 2016-08-03 NOTE — ED Triage Notes (Signed)
Pt c/o hematuria and dysuria x 8 mths, pt reports seeing urologist, pt febrile in triage ,pt c/o 6/10 mid CP with SOB with hx of COPD, pt diaphoretic with labored breathing, A&O x4

## 2016-08-03 NOTE — H&P (Signed)
History and Physical    Emon Lance LPF:[id number] DOB: 10/10/1945 DOA: 08/03/2016   PCP: [name], MD   Patient coming from:  Home    Chief Complaint: dysuria and abdominal pain                                Shortness of breath   HPI: [name] is a [name]  71 y.o. male with medical history significant for COPD not O2 dependent, HTN, HLD, CAD s/p MI, chronic hematuria and dysuria for 8 months to be seen by Urology in 2 weeks, presenting with intermittent dysuria, abdominal pain.Patient able to understand English, answering with simple yes or no, In addition he reported increasing shortness of breath and  productive white cough, subjective fevers.  Denies rhinorrhea or hemoptysis. Denies fevers, chills, night sweats or mucositis. Reported central chest pain with the cough but no chest wall pain or palpitations.Denies any sick contacts or recent long distant travels.  Has decreased appetite due to current symptoms. Denies nausea or vomiting. Denies dizziness or vertigo. Denies lower extremity swelling. No confusion was reported. Denies any vision changes, double vision or headaches. No tobacco at this time. No ETOH or recreational drugs.   ED Course:  BP (!) 100/42   Pulse 67   Temp 99.9 F (37.7 C) (Oral)   Resp 20   Wt 64.4 kg (142 lb)   SpO2 95%   BMI 25.97 kg/m    on presentation, he was tachypneic, afebrile up to 99, and mildly hypoxic ads 93, at which time supplemental oxygen was given with good response. The patient was wheezing chest x-ray showed some opacity at the right base lactic acid 2.8 white blood cells 13.6 urine with large hemoglobin, negative for nitrites, positive for small leukocytes blood culture and urine culture pending troponin 0 EKG sinus rhythm without ACS initially placed at IV  125 mL an hour of normal saline receives Zithromax and Rocephin IV Tylenol given for low grade fever culture pending   Review of Systems:  As per HPI otherwise all  other systems reviewed and are negative  Past Medical History:  Diagnosis Date  . Allergic rhinitis   . Asthma   . CAD (coronary artery disease), native coronary artery 6/11, 2/14, 6/14; 7/15   a. thought due to vasospasm . cath - CAD LAD 40-50%, LCX 40%, RCA 25% EF 65%. lexiscan myoview EF 68% w/o evidence of ischemia or infarct b. STEMI s/p BMS-mid RCA c. BMS x 2- prox RCA, PLOM  . COPD (chronic obstructive pulmonary disease) ([place])    former smoker  . Diabetes mellitus without complication ([place])    type 2  . GERD (gastroesophageal reflux disease)   . HTN (hypertension)   . Hyperlipidemia   . Shortness of breath dyspnea   . Tobacco abuse     Past Surgical History:  Procedure Laterality Date  . CARDIAC CATHETERIZATION  08/03/13   non obstructive disease LCX 20%; LAD 20%; stents in prox RCA and mid are patent, mild in stent stenosis POM, 30%, normal EF 55-65%  . CARDIAC CATHETERIZATION N/A 12/27/2014   Procedure: Left Heart Cath and Coronary Angiography;  Surgeon: Jettie Booze, MD;  Location: [facility];  Service: Cardiovascular;  Laterality: N/A;  . CORONARY ANGIOPLASTY WITH STENT PLACEMENT  02/15/12   20% distal LM, 30-40% mid LAD, 50% ostial diag, 50-60% mid LCx, 99% focal mid RCA stenosis s/p BMS; 20%  distal RCA disease, 50% prox PLB; LVEF 55-65%  . CORONARY ANGIOPLASTY WITH STENT PLACEMENT  07/01/12   30% prox LAD, 30% mid LAD, 20% mid LCx, 90% prox RCA s/p BMS, patent mid RCA stent, 30-40% distal RCA, 80% PLB s/p BMS; EF 55-65%  . left foot surgery     repair - and ankle   . LEFT HEART CATHETERIZATION WITH CORONARY ANGIOGRAM N/A 02/15/2012   Procedure: LEFT HEART CATHETERIZATION WITH CORONARY ANGIOGRAM;  Surgeon: Peter M [name], MD;  Location: [facility] CATH LAB;  Service: Cardiovascular;  Laterality: N/A;  . LEFT HEART CATHETERIZATION WITH CORONARY ANGIOGRAM N/A 07/01/2012   Procedure: LEFT HEART CATHETERIZATION WITH CORONARY ANGIOGRAM;  Surgeon: Peter M [name], MD;   Location: [facility] CATH LAB;  Service: Cardiovascular;  Laterality: N/A;  . LEFT HEART CATHETERIZATION WITH CORONARY ANGIOGRAM N/A 08/03/2013   Procedure: LEFT HEART CATHETERIZATION WITH CORONARY ANGIOGRAM;  Surgeon: Peter M [name], MD;  Location: [facility] CATH LAB;  Service: Cardiovascular;  Laterality: N/A;  . PERCUTANEOUS CORONARY STENT INTERVENTION (PCI-S) N/A 02/15/2012   Procedure: PERCUTANEOUS CORONARY STENT INTERVENTION (PCI-S);  Surgeon: Peter M [name], MD;  Location: [facility] CATH LAB;  Service: Cardiovascular;  Laterality: N/A;  . PERCUTANEOUS CORONARY STENT INTERVENTION (PCI-S)  07/01/2012   Procedure: PERCUTANEOUS CORONARY STENT INTERVENTION (PCI-S);  Surgeon: Peter M [name], MD;  Location: [facility] CATH LAB;  Service: Cardiovascular;;    Social History Social History   Social History  . Marital status: Married    Spouse name: N/A  . Number of children: N/A  . Years of education: N/A   Occupational History  . disability    Social History Main Topics  . Smoking status: Former Smoker    Packs/day: 1.00    Years: 50.00    Types: Cigarettes    Quit date: 01/09/2008  . Smokeless tobacco: Never Used     Comment: rolled own cigarettes since age of 75,   . Alcohol use No     Comment: occassional   . Drug use: No  . Sexual activity: Not Currently     Comment: pt sts he only smoke 1 cigarette, not every day   Other Topics Concern  . Not on file   Social History Narrative   Married, 4 children.   Disability secondary to lung disease.    No known family history of premature CAD.     No Known Allergies  Family History  Problem Relation Age of Onset  . CAD Neg Hx        no known FHx of early CAD      Prior to Admission medications   Medication Sig Start Date End Date Taking? Authorizing Provider  amLODipine (NORVASC) 5 MG tablet TAKE 1 TABLET (5 MG TOTAL) BY MOUTH DAILY. 06/07/15  Yes [name], Peter M, MD  aspirin 81 MG tablet Take 81 mg by mouth daily.    Yes [provider]    budesonide-formoterol (SYMBICORT) 160-4.5 MCG/ACT inhaler Inhale 2 puffs into the lungs 2 (two) times daily. 05/06/14  Yes Robbie Lis, MD  metFORMIN (GLUCOPHAGE) 500 MG tablet Take 500 mg by mouth 2 (two) times daily with a meal.    Yes [provider]  nitroGLYCERIN (NITROSTAT) 0.4 MG SL tablet Place 1 tablet (0.4 mg total) under the tongue every 5 (five) minutes as needed for chest pain. 01/05/15  Yes [name], PA  omeprazole (PRILOSEC) 20 MG capsule Take 20 mg by mouth daily. 05/10/16  Yes [provider]  potassium chloride SA (  KLOR-CON M20) 20 MEQ tablet Take 1 tablet (20 mEq total) by mouth daily. Patient taking differently: Take 20 mEq by mouth daily as needed (cramps).  05/17/15  Yes [name], Peter M, MD  tiotropium (SPIRIVA HANDIHALER) 18 MCG inhalation capsule Place 1 capsule (18 mcg total) into inhaler and inhale daily. 05/06/14  Yes Robbie Lis, MD  albuterol (PROVENTIL HFA) 108 (90 BASE) MCG/ACT inhaler Inhale 2 puffs into the lungs 2 (two) times daily. 05/06/14   Robbie Lis, MD  bisoprolol (ZEBETA) 10 MG tablet Take 0.5 tablets (5 mg total) by mouth daily. 04/08/15   [name], Peter M, MD  guaiFENesin (MUCINEX) 600 MG 12 hr tablet Take 2 tablets (1,200 mg total) by mouth 2 (two) times daily. 06/05/15   Elgergawy, Silver Huguenin, MD  ipratropium-albuterol (DUONEB) 0.5-2.5 (3) MG/3ML SOLN Take 3 mLs by nebulization every 6 (six) hours as needed. 06/05/15   Elgergawy, Silver Huguenin, MD  losartan (COZAAR) 50 MG tablet Take 1 tablet (50 mg total) by mouth daily. 01/05/15   [name], PA  nitrofurantoin, macrocrystal-monohydrate, (MACROBID) 100 MG capsule Take 100 mg by mouth 2 (two) times daily. 07/24/16   [provider]  pantoprazole (PROTONIX) 40 MG tablet Take 40 mg by mouth daily. 05/23/15   [provider]  predniSONE (DELTASONE) 10 MG tablet Please take 50 mg daily for 3 days, then 40 mg daily for 2 days, then 30 mg daily for 2 days, then 20 mg daily for 2 days,  then 10 mg daily for 2 days then stop. 06/05/15   Elgergawy, Silver Huguenin, MD  rosuvastatin (CRESTOR) 20 MG tablet Take 20 mg daily for elevated cholesterol Patient not taking: Reported on 08/03/2016 08/15/15   [name], Peter M, MD  Spacer/Aero-Holding Chambers (AEROCHAMBER MV) inhaler Use as instructed 11/22/14   Juanito Doom, MD  tamsulosin (FLOMAX) 0.4 MG CAPS capsule Take 0.4 mg by mouth every evening. 07/25/16   [provider]  TOVIAZ 4 MG TB24 tablet Take 4 mg by mouth every evening. 07/25/16   [provider]    Physical Exam:  Vitals:   08/03/16 1000 08/03/16 1015 08/03/16 1016 08/03/16 1045  BP: (!) 110/53 (!) 103/50  (!) 100/42  Pulse: 75 73  67  Resp: (!) 24 (!) 22  20  Temp:   99.9 F (37.7 C)   TempSrc:   Oral   SpO2: 94% 94%  95%  Weight:       Constitutional: NAD,but ill appearing, diaphoretic Eyes: PERRL, lids and conjunctivae normal ENMT: Mucous membranes are moist, without exudate or lesions  Neck: normal, supple, no masses, no thyromegaly Respiratory: cright base crackles, bilateral severe expiratory wheezing, mild tachypnea, no accessory muscle use  Cardiovascular: Regular rate and rhythm, no murmurs, rubs or gallops. No extremity edema. 2+ pedal pulses. No carotid bruits.  Abdomen: Soft, tender to palpation at the lower pelvic  area, mildly distended, No hepatosplenomegaly. Bowel sounds positive.  Musculoskeletal: no clubbing / cyanosis. Moves all extremities Skin: no jaundice, No lesions.  Neurologic: Sensation intact  Strength equal in all extremities Psychiatric:   Alert and oriented x 3. Anxious     Labs on Admission: I have personally reviewed following labs and imaging studies  CBC:  Recent Labs Lab 08/03/16 0937  WBC 13.6*  NEUTROABS 11.1*  HGB 14.6  HCT 43.7  MCV 88.5  PLT 703    Basic Metabolic Panel:  Recent Labs Lab 08/03/16 0937  NA 138  K 4.0  CL 104  CO2 26  GLUCOSE 129*  BUN 12  CREATININE 1.00  CALCIUM  8.5*    GFR: CrCl cannot be calculated (Unknown ideal weight.).  Liver Function Tests:  Recent Labs Lab 08/03/16 0937  AST 28  ALT 52  ALKPHOS 64  BILITOT 0.8  PROT 6.4*  ALBUMIN 3.1*   No results for input(s): LIPASE, AMYLASE in the last 168 hours. No results for input(s): AMMONIA in the last 168 hours.  Coagulation Profile: No results for input(s): INR, PROTIME in the last 168 hours.  Cardiac Enzymes: No results for input(s): CKTOTAL, CKMB, CKMBINDEX, TROPONINI in the last 168 hours.  BNP (last 3 results) No results for input(s): PROBNP in the last 8760 hours.  HbA1C: No results for input(s): HGBA1C in the last 72 hours.  CBG: No results for input(s): GLUCAP in the last 168 hours.  Lipid Profile: No results for input(s): CHOL, HDL, LDLCALC, TRIG, CHOLHDL, LDLDIRECT in the last 72 hours.  Thyroid Function Tests: No results for input(s): TSH, T4TOTAL, FREET4, T3FREE, THYROIDAB in the last 72 hours.  Anemia Panel: No results for input(s): VITAMINB12, FOLATE, FERRITIN, TIBC, IRON, RETICCTPCT in the last 72 hours.  Urine analysis:    Component Value Date/Time   COLORURINE YELLOW 08/03/2016 0937   APPEARANCEUR HAZY (A) 08/03/2016 0937   LABSPEC 1.013 08/03/2016 0937   PHURINE 5.0 08/03/2016 0937   GLUCOSEU NEGATIVE 08/03/2016 0937   GLUCOSEU NEGATIVE 03/01/2015 1719   HGBUR LARGE (A) 08/03/2016 0937   BILIRUBINUR NEGATIVE 08/03/2016 0937   KETONESUR NEGATIVE 08/03/2016 0937   PROTEINUR 30 (A) 08/03/2016 0937   UROBILINOGEN 0.2 03/01/2015 1719   NITRITE NEGATIVE 08/03/2016 0937   LEUKOCYTESUR SMALL (A) 08/03/2016 0937    Sepsis Labs: @LABRCNTIP (procalcitonin:4,lacticidven:4) )No results found for this or any previous visit (from the past 240 hour(s)).   Radiological Exams on Admission: Dg Chest Portable 1 View  Result Date: 08/03/2016 CLINICAL DATA:  Acute onset of mid chest pain and shortness of breath. Current history of hypertension and COPD. EXAM:  PORTABLE CHEST 1 VIEW COMPARISON:  06/01/2015, 12/23/2014 and earlier, including CTA chest 04/30/2014. FINDINGS: Cardiac silhouette upper normal in size to slightly enlarged for AP portable technique, unchanged. Thoracic aorta mildly tortuous and atherosclerotic, unchanged. Hilar and mediastinal contours otherwise unremarkable. Emphysematous changes in both lungs as noted previously. Confluent airspace opacities involving the right lower lobe and patchy airspace opacities in the superior segment right lower lobe associated with a small right pleural effusion. Lungs otherwise clear. No left pleural effusion. Pulmonary vascularity normal. IMPRESSION: 1. Pneumonia involving the right lower lobe with an associated small right parapneumonic effusion. 2.  Emphysema. (ICD10-J43.9) 3.  Aortic Atherosclerosis (ICD10-170.0) Electronically Signed   By: Evangeline Dakin M.D.   On: 08/03/2016 09:58   BP (!) 100/42   Pulse 67   Temp 99.9 F (37.7 C) (Oral)   Resp 20   Wt 64.4 kg (142 lb)   SpO2 95%   BMI 25.97 kg/m   EKG: Independently reviewed.  Assessment/Plan Active Problems:   Sepsis ([place])   Coronary atherosclerosis   OBSTRUCTIVE CHRONIC BRONCHITIS   GERD   STEMI (ST elevation myocardial infarction) ([place])   Hyperlipidemia   Essential hypertension   HTN (hypertension)   SOB (shortness of breath)   Uncontrolled diabetes mellitus without complication ([facility])   Dysuria   Hematuria   Sepsis likely due to respiratory source organism unknown in a patient with COPD, not O2 dependent  Patient meets criteria given  Leukocytosis, elevated lactic acid  and mild hypoxia. No confusion .  Antibiotics delivered in the ED with IV Rocephin and Zithromax. UA small leukocytes . EKG SR no ACS  On O2 2L . Received 125 cc/h IVF . Received Solumedrol IV 125 mg x1  Admit to SDU Sepsis order set  IV antibiotics by pharmacy with Rocephin and ZIthromax Continue home nebs with Duoneb, as well as spiriva  Follow lactic acid    Follow blood and urine cultures IV fluids at 100 cc/h.  Procalcitonin order set  Continue Solumedrol 60 mg q 8 H IV   Incentive spirometry   Repeat CBC in am   Lower abdominal pain with chronic hematuria, acute dysuria. He reports decreased urine stream  Patient has appt with Urology in 2 weeks Check KUB Bladder scan, if greater than 300 cc residual, will place Urine catheter in view of need for IVF as patient is septic  Await urine culture   Continue Rocephin  Check CBC in am  May need Urology consult pending on findings on film    Hypertension BP  100/42   Pulse 67   Controlled Continue home anti-hypertensive medications      Type II Diabetes Current blood sugar level is 129 Lab Results  Component Value Date   HGBA1C 7.9 (H) 06/01/2015  Hgb A1C Hold home oral diabetic medications.  SSI Heart healthy carb modified diet.   CAD, s/p multiple stens, last in 2015  EKG SR no ACS,  Tn 0   ASA on hold due to hematuria  Continue SL NTG   Hyperlipidemia Continue home statins   Chronic diastolic CHF  grade I on echo Dec 2016. Appears compensated . Weight 142 lbs  Continue meds  monitor I/Os and daily weights prn 02  GERD, no acute symptoms Continue PPI                                          DVT prophylaxis  SCD's  Code Status:   Full    Family Communication:  Discussed with patient Disposition Plan: Expect patient to be discharged to home after condition improves Consults called:    None  Admission status:  SDU    Maliyah Willets E, PA-C [facility]   08/03/2016, 11:10 AM

## 2016-08-03 NOTE — Progress Notes (Signed)
Attempted to get report from ED RN 

## 2016-08-03 NOTE — ED Notes (Signed)
Patient lab results reported to Nurse Erin.

## 2016-08-03 NOTE — ED Notes (Signed)
No fluid resuscitation ordered by provider.

## 2016-08-03 NOTE — ED Notes (Signed)
PA student at bedside.

## 2016-08-03 NOTE — ED Notes (Signed)
Pt. Placed on 2L oxygen via [place] for oxygen saturation @ 90%. Pt. Pointing to abdomen and saying it hurts. Pt. Labored breathing.

## 2016-08-03 NOTE — ED Notes (Signed)
Pt. Transported to floor by RN on telemetry monitor. Pt. Has cane, cell phone, flip flops, and red shirt in belongings bag. Pt. States his wallet is at home.

## 2016-08-03 NOTE — ED Notes (Signed)
Pt. Got up to urinate and felt very SOB. Pt. Anxious and ripping off monitors. RN got patient back in bed and hooked him back to monitors. Admitting MD at bedside evaluating patient.

## 2016-08-03 NOTE — ED Provider Notes (Signed)
[facility] DEPT Provider Note   CSN: [id number] Arrival date & time: 08/03/16  4098     History   Chief Complaint Chief Complaint  Patient presents with  . Hematuria  . Shortness of Breath    HPI [name] is a 71 y.o. male.  HPI   The patient is a 71 year old male, he is [name], he has a known history of COPD as well as diabetes hypertension hyperlipidemia, coronary disease and has had a history of chronic hematuria which has been going on for over 9 months. The patient reports that he has been referred to a urologist and is scheduled to see them within the next couple of weeks. He reports having intermittent burning with his hematuria. Recently he has also developed some increased shortness of breath, feeling febrile, increased cough with white sputum. This is gradually worsening and has become severe propping his visit to the emergency department this morning.  Tylenol given in triage for his fever.  Past Medical History:  Diagnosis Date  . Allergic rhinitis   . Asthma   . CAD (coronary artery disease), native coronary artery 6/11, 2/14, 6/14; 7/15   a. thought due to vasospasm . cath - CAD LAD 40-50%, LCX 40%, RCA 25% EF 65%. lexiscan myoview EF 68% w/o evidence of ischemia or infarct b. STEMI s/p BMS-mid RCA c. BMS x 2- prox RCA, PLOM  . COPD (chronic obstructive pulmonary disease) ([place])    former smoker  . Diabetes mellitus without complication ([place])    type 2  . GERD (gastroesophageal reflux disease)   . HTN (hypertension)   . Hyperlipidemia   . Shortness of breath dyspnea   . Tobacco abuse     Patient Active Problem List   Diagnosis Date Noted  . Diabetes mellitus, type 2 ([place]) 06/01/2015  . Acute on chronic respiratory failure with hypercapnia ([place])   . Diabetes mellitus with complication ([place])   . Dysuria 03/01/2015  . Type 2 diabetes mellitus with circulatory disorder ([place])   . Hypokalemia   . PVC's (premature ventricular contractions)   .  Shortness of breath 05/01/2014  . SIRS (systemic inflammatory response syndrome) ([place]) 05/01/2014  . COPD exacerbation ([place]) 05/01/2014  . HTN (hypertension) 05/01/2014  . CAD (coronary artery disease), native coronary artery 05/01/2014  . Acute respiratory failure with hypoxia ([place]) 05/01/2014  . Uncontrolled diabetes mellitus without complication ([place]) 11/91/4782  . SOB (shortness of breath)   . Blood poisoning   . Chest pain, negative MI, secondary due to COPD exacerbation  08/04/2013  . Abnormal stress test 07/02/2012  . STEMI (ST elevation myocardial infarction) ([place]) 02/17/2012  . Hyperlipidemia 02/17/2012  . Essential hypertension 02/17/2012  . Tobacco abuse, has stopped 02/17/2012  . Allergic rhinitis 02/17/2012  . CAD, hx of stents to RCA and patent to nonobstructive on cath 03/06/13 06/14/2009  . COPD with exacerbation ([place]) 05/31/2009  . OBSTRUCTIVE CHRONIC BRONCHITIS 11/13/2006  . GERD 11/13/2006    Past Surgical History:  Procedure Laterality Date  . CARDIAC CATHETERIZATION  08/03/13   non obstructive disease LCX 20%; LAD 20%; stents in prox RCA and mid are patent, mild in stent stenosis POM, 30%, normal EF 55-65%  . CARDIAC CATHETERIZATION N/A 12/27/2014   Procedure: Left Heart Cath and Coronary Angiography;  Surgeon: Jettie Booze, MD;  Location: [facility];  Service: Cardiovascular;  Laterality: N/A;  . CORONARY ANGIOPLASTY WITH STENT PLACEMENT  02/15/12   20% distal LM, 30-40% mid LAD, 50% ostial diag, 50-60%  mid LCx, 99% focal mid RCA stenosis s/p BMS; 20% distal RCA disease, 50% prox PLB; LVEF 55-65%  . CORONARY ANGIOPLASTY WITH STENT PLACEMENT  07/01/12   30% prox LAD, 30% mid LAD, 20% mid LCx, 90% prox RCA s/p BMS, patent mid RCA stent, 30-40% distal RCA, 80% PLB s/p BMS; EF 55-65%  . left foot surgery     repair - and ankle   . LEFT HEART CATHETERIZATION WITH CORONARY ANGIOGRAM N/A 02/15/2012   Procedure: LEFT HEART CATHETERIZATION WITH CORONARY  ANGIOGRAM;  Surgeon: Peter M [name], MD;  Location: [facility] CATH LAB;  Service: Cardiovascular;  Laterality: N/A;  . LEFT HEART CATHETERIZATION WITH CORONARY ANGIOGRAM N/A 07/01/2012   Procedure: LEFT HEART CATHETERIZATION WITH CORONARY ANGIOGRAM;  Surgeon: Peter M [name], MD;  Location: [facility] CATH LAB;  Service: Cardiovascular;  Laterality: N/A;  . LEFT HEART CATHETERIZATION WITH CORONARY ANGIOGRAM N/A 08/03/2013   Procedure: LEFT HEART CATHETERIZATION WITH CORONARY ANGIOGRAM;  Surgeon: Peter M [name], MD;  Location: [name] CATH LAB;  Service: Cardiovascular;  Laterality: N/A;  . PERCUTANEOUS CORONARY STENT INTERVENTION (PCI-S) N/A 02/15/2012   Procedure: PERCUTANEOUS CORONARY STENT INTERVENTION (PCI-S);  Surgeon: Peter M [name], MD;  Location: [facility] CATH LAB;  Service: Cardiovascular;  Laterality: N/A;  . PERCUTANEOUS CORONARY STENT INTERVENTION (PCI-S)  07/01/2012   Procedure: PERCUTANEOUS CORONARY STENT INTERVENTION (PCI-S);  Surgeon: Peter M [name], MD;  Location: [facility] CATH LAB;  Service: Cardiovascular;;       Home Medications    Prior to Admission medications   Medication Sig Start Date End Date Taking? Authorizing Provider  amLODipine (NORVASC) 5 MG tablet TAKE 1 TABLET (5 MG TOTAL) BY MOUTH DAILY. 06/07/15  Yes [name], Peter M, MD  aspirin 81 MG tablet Take 81 mg by mouth daily.    Yes [provider]  budesonide-formoterol (SYMBICORT) 160-4.5 MCG/ACT inhaler Inhale 2 puffs into the lungs 2 (two) times daily. 05/06/14  Yes Robbie Lis, MD  metFORMIN (GLUCOPHAGE) 500 MG tablet Take 500 mg by mouth 2 (two) times daily with a meal.    Yes [provider]  nitroGLYCERIN (NITROSTAT) 0.4 MG SL tablet Place 1 tablet (0.4 mg total) under the tongue every 5 (five) minutes as needed for chest pain. 01/05/15  Yes [name], PA  omeprazole (PRILOSEC) 20 MG capsule Take 20 mg by mouth daily. 05/10/16  Yes [provider]  potassium chloride SA (KLOR-CON M20) 20 MEQ tablet Take 1 tablet (20  mEq total) by mouth daily. Patient taking differently: Take 20 mEq by mouth daily as needed (cramps).  05/17/15  Yes [name], Peter M, MD  tiotropium (SPIRIVA HANDIHALER) 18 MCG inhalation capsule Place 1 capsule (18 mcg total) into inhaler and inhale daily. 05/06/14  Yes Robbie Lis, MD  albuterol (PROVENTIL HFA) 108 (90 BASE) MCG/ACT inhaler Inhale 2 puffs into the lungs 2 (two) times daily. 05/06/14   Robbie Lis, MD  bisoprolol (ZEBETA) 10 MG tablet Take 0.5 tablets (5 mg total) by mouth daily. 04/08/15   [name], Peter M, MD  guaiFENesin (MUCINEX) 600 MG 12 hr tablet Take 2 tablets (1,200 mg total) by mouth 2 (two) times daily. 06/05/15   Elgergawy, Silver Huguenin, MD  ipratropium-albuterol (DUONEB) 0.5-2.5 (3) MG/3ML SOLN Take 3 mLs by nebulization every 6 (six) hours as needed. 06/05/15   Elgergawy, Silver Huguenin, MD  losartan (COZAAR) 50 MG tablet Take 1 tablet (50 mg total) by mouth daily. 01/05/15   [name], PA  pantoprazole (PROTONIX) 40 MG tablet Take  40 mg by mouth daily. 05/23/15   [provider]  predniSONE (DELTASONE) 10 MG tablet Please take 50 mg daily for 3 days, then 40 mg daily for 2 days, then 30 mg daily for 2 days, then 20 mg daily for 2 days, then 10 mg daily for 2 days then stop. 06/05/15   Elgergawy, Silver Huguenin, MD  rosuvastatin (CRESTOR) 20 MG tablet Take 20 mg daily for elevated cholesterol Patient not taking: Reported on 08/03/2016 08/15/15   [name], Peter M, MD  Spacer/Aero-Holding Chambers (AEROCHAMBER MV) inhaler Use as instructed 11/22/14   Juanito Doom, MD    Family History Family History  Problem Relation Age of Onset  . CAD Neg Hx        no known FHx of early CAD    Social History Social History  Substance Use Topics  . Smoking status: Former Smoker    Packs/day: 1.00    Years: 50.00    Types: Cigarettes    Quit date: 01/09/2008  . Smokeless tobacco: Never Used     Comment: rolled own cigarettes since age of 66,   . Alcohol use No     Comment:  occassional      Allergies   Patient has no known allergies.   Review of Systems Review of Systems  All other systems reviewed and are negative.    Physical Exam Updated Vital Signs BP (!) 103/50   Pulse 73   Temp 99.9 F (37.7 C) (Oral)   Resp (!) 22   Wt 64.4 kg (142 lb)   SpO2 94%   BMI 25.97 kg/m   Physical Exam  Constitutional: He appears well-developed and well-nourished. He appears distressed.  Fever  HENT:  Head: Normocephalic and atraumatic.  Mouth/Throat: Oropharynx is clear and moist. No oropharyngeal exudate.  Eyes: Pupils are equal, round, and reactive to light. Conjunctivae and EOM are normal. Right eye exhibits no discharge. Left eye exhibits no discharge. No scleral icterus.  Neck: Normal range of motion. Neck supple. No JVD present. No thyromegaly present.  Cardiovascular: Normal rate, regular rhythm, normal heart sounds and intact distal pulses.  Exam reveals no gallop and no friction rub.   No murmur heard. Pulmonary/Chest: Effort normal. No respiratory distress. He has wheezes. He has rales.  The patient has subtle rales at the right base, there is wheezing bilaterally on expiration, mild tachypnea, speaks in full sentences  Abdominal: Soft. Bowel sounds are normal. He exhibits no distension and no mass. There is no tenderness.  Musculoskeletal: Normal range of motion. He exhibits no edema or tenderness.  Lymphadenopathy:    He has no cervical adenopathy.  Neurological: He is alert. Coordination normal.  Skin: Skin is warm and dry. No rash noted. No erythema.  Psychiatric: He has a normal mood and affect. His behavior is normal.  Nursing note and vitals reviewed.    ED Treatments / Results  Labs (all labs ordered are listed, but only abnormal results are displayed) Labs Reviewed  COMPREHENSIVE METABOLIC PANEL - Abnormal; Notable for the following:       Result Value   Glucose, Bld 129 (*)    Calcium 8.5 (*)    Total Protein 6.4 (*)     Albumin 3.1 (*)    All other components within normal limits  CBC WITH DIFFERENTIAL/PLATELET - Abnormal; Notable for the following:    WBC 13.6 (*)    Neutro Abs 11.1 (*)    All other components within normal limits  URINALYSIS,  ROUTINE W REFLEX MICROSCOPIC - Abnormal; Notable for the following:    APPearance HAZY (*)    Hgb urine dipstick LARGE (*)    Protein, ur 30 (*)    Leukocytes, UA SMALL (*)    Bacteria, UA FEW (*)    Squamous Epithelial / LPF 0-5 (*)    All other components within normal limits  I-STAT CG4 LACTIC ACID, ED - Abnormal; Notable for the following:    Lactic Acid, Venous 2.82 (*)    All other components within normal limits  CULTURE, BLOOD (ROUTINE X 2)  CULTURE, BLOOD (ROUTINE X 2)  PROTIME-INR  I-STAT TROPONIN, ED    EKG  EKG Interpretation  Date/Time:  Friday August 03 2016 08:55:19 EDT Ventricular Rate:  89 PR Interval:  142 QRS Duration: 80 QT Interval:  340 QTC Calculation: 413 R Axis:   63 Text Interpretation:  Normal sinus rhythm Normal ECG Normal ECG since last tracing no significant change Confirmed by Noemi Chapel [phone]) on 08/03/2016 10:34:03 AM       Radiology Dg Chest Portable 1 View  Result Date: 08/03/2016 CLINICAL DATA:  Acute onset of mid chest pain and shortness of breath. Current history of hypertension and COPD. EXAM: PORTABLE CHEST 1 VIEW COMPARISON:  06/01/2015, 12/23/2014 and earlier, including CTA chest 04/30/2014. FINDINGS: Cardiac silhouette upper normal in size to slightly enlarged for AP portable technique, unchanged. Thoracic aorta mildly tortuous and atherosclerotic, unchanged. Hilar and mediastinal contours otherwise unremarkable. Emphysematous changes in both lungs as noted previously. Confluent airspace opacities involving the right lower lobe and patchy airspace opacities in the superior segment right lower lobe associated with a small right pleural effusion. Lungs otherwise clear. No left pleural effusion. Pulmonary  vascularity normal. IMPRESSION: 1. Pneumonia involving the right lower lobe with an associated small right parapneumonic effusion. 2.  Emphysema. (ICD10-J43.9) 3.  Aortic Atherosclerosis (ICD10-170.0) Electronically Signed   By: Evangeline [name] M.D.   On: 08/03/2016 09:58    Procedures Procedures (including critical care time)  Medications Ordered in ED Medications  acetaminophen (TYLENOL) 325 MG tablet (not administered)  cefTRIAXone (ROCEPHIN) 1 g in dextrose 5 % 50 mL IVPB (1 g Intravenous New Bag/Given 08/03/16 1012)  0.9 %  sodium chloride infusion (not administered)  acetaminophen (TYLENOL) tablet 650 mg (650 mg Oral Given 08/03/16 0910)  azithromycin (ZITHROMAX) tablet 500 mg (500 mg Oral Given 08/03/16 1012)     Initial Impression / Assessment and Plan / ED Course  I have reviewed the triage vital signs and the nursing notes.  Pertinent labs & imaging results that were available during my care of the patient were reviewed by me and considered in my medical decision making (see chart for details).    The patient is somewhat ill-appearing given that he is tachypneic, febrile and mildly hypoxic at 93% with supplemental oxygen. He is wheezing but also has some rales and an x-ray that looks like there is some opacity at the right base. He will need to be treated at the minimum for a community acquired pneumonia and unfortunately the patient has an elevated lactic acid at 2.8, he has a white blood cell count of 13,600 and a fever thus likely giving him sepsis from a pulmonary source. Currently the blood pressure has a normal mean arterial pressure, he does not need fluids or pressors at this time.  The patient does have a right lower lobe pneumonia and with the leukocytosis and the lactic acidosis this is likely early sepsis. He has  been given some IV fluids, antibiotics to cover for sepsis with community-acquired pneumonia and hospitalist has been paged.  D/w hospitalist who will  admit  CRITICAL CARE Performed by: Johnna Acosta Total critical care time: 35 minutes Critical care time was exclusive of separately billable procedures and treating other patients. Critical care was necessary to treat or prevent imminent or life-threatening deterioration. Critical care was time spent personally by me on the following activities: development of treatment plan with patient and/or surrogate as well as nursing, discussions with consultants, evaluation of patient's response to treatment, examination of patient, obtaining history from patient or surrogate, ordering and performing treatments and interventions, ordering and review of laboratory studies, ordering and review of radiographic studies, pulse oximetry and re-evaluation of patient's condition.   Final Clinical Impressions(s) / ED Diagnoses   Final diagnoses:  Sepsis, due to unspecified organism [facility])  Community acquired pneumonia of right lower lobe of lung ([place])    New Prescriptions New Prescriptions   No medications on file     Noemi Chapel, MD 08/03/16 1048

## 2016-08-03 NOTE — ED Notes (Signed)
I-stat lactic acid 2.82 Not flowing into epic MD Noemi Chapel made aware of result

## 2016-08-04 ENCOUNTER — Other Ambulatory Visit: Payer: Self-pay | Admitting: Cardiology

## 2016-08-04 DIAGNOSIS — J449 Chronic obstructive pulmonary disease, unspecified: Secondary | ICD-10-CM

## 2016-08-04 DIAGNOSIS — E118 Type 2 diabetes mellitus with unspecified complications: Secondary | ICD-10-CM

## 2016-08-04 DIAGNOSIS — E784 Other hyperlipidemia: Secondary | ICD-10-CM

## 2016-08-04 DIAGNOSIS — A419 Sepsis, unspecified organism: Principal | ICD-10-CM

## 2016-08-04 DIAGNOSIS — E1165 Type 2 diabetes mellitus with hyperglycemia: Secondary | ICD-10-CM

## 2016-08-04 LAB — CBC WITH DIFFERENTIAL/PLATELET
BASOS PCT: 0 %
Basophils Absolute: 0 10*3/uL (ref 0.0–0.1)
Eosinophils Absolute: 0 10*3/uL (ref 0.0–0.7)
Eosinophils Relative: 0 %
HEMATOCRIT: 38.9 % — AB (ref 39.0–52.0)
HEMOGLOBIN: 13.6 g/dL (ref 13.0–17.0)
LYMPHS ABS: 0.9 10*3/uL (ref 0.7–4.0)
Lymphocytes Relative: 4 %
MCH: 30.4 pg (ref 26.0–34.0)
MCHC: 35 g/dL (ref 30.0–36.0)
MCV: 86.8 fL (ref 78.0–100.0)
MONOS PCT: 2 %
Monocytes Absolute: 0.3 10*3/uL (ref 0.1–1.0)
NEUTROS ABS: 19 10*3/uL — AB (ref 1.7–7.7)
NEUTROS PCT: 94 %
Platelets: 267 10*3/uL (ref 150–400)
RBC: 4.48 MIL/uL (ref 4.22–5.81)
RDW: 13.9 % (ref 11.5–15.5)
WBC: 20.3 10*3/uL — AB (ref 4.0–10.5)

## 2016-08-04 LAB — BASIC METABOLIC PANEL
ANION GAP: 8 (ref 5–15)
BUN: 14 mg/dL (ref 6–20)
CALCIUM: 7.5 mg/dL — AB (ref 8.9–10.3)
CO2: 21 mmol/L — ABNORMAL LOW (ref 22–32)
Chloride: 108 mmol/L (ref 101–111)
Creatinine, Ser: 0.78 mg/dL (ref 0.61–1.24)
GFR calc non Af Amer: >60 mL/min (ref >60)
Glucose, Bld: 237 mg/dL — ABNORMAL HIGH (ref 65–99)
Potassium: 3.2 mmol/L — ABNORMAL LOW (ref 3.5–5.1)
SODIUM: 137 mmol/L (ref 135–145)

## 2016-08-04 LAB — GLUCOSE, CAPILLARY
GLUCOSE-CAPILLARY: 300 mg/dL — AB (ref 65–99)
GLUCOSE-CAPILLARY: 231 mg/dL — AB (ref 65–99)
Glucose-Capillary: 260 mg/dL — ABNORMAL HIGH (ref 65–99)
Glucose-Capillary: 254 mg/dL — ABNORMAL HIGH (ref 65–99)

## 2016-08-04 LAB — URINE CULTURE: Culture: NO GROWTH

## 2016-08-04 LAB — HEMOGLOBIN A1C
HEMOGLOBIN A1C: 7.2 % — AB (ref 4.8–5.6)
MEAN PLASMA GLUCOSE: 160 mg/dL

## 2016-08-04 LAB — PSA: Prostatic Specific Antigen: 1.16 ng/mL (ref 0.00–4.00)

## 2016-08-04 LAB — LACTIC ACID, PLASMA: Lactic Acid, Venous: 1.7 mmol/L (ref 0.5–1.9)

## 2016-08-04 MED ORDER — TAMSULOSIN HCL 0.4 MG PO CAPS
0.4000 mg | ORAL_CAPSULE | Freq: Two times a day (BID) | ORAL | Status: DC
  Administered 2016-08-05 – 2016-08-08 (×7): 0.4 mg via ORAL
  Filled 2016-08-04 (×7): qty 1

## 2016-08-04 MED ORDER — POTASSIUM CHLORIDE CRYS ER 10 MEQ PO TBCR
30.0000 meq | EXTENDED_RELEASE_TABLET | Freq: Once | ORAL | Status: AC
  Administered 2016-08-04: 30 meq via ORAL
  Filled 2016-08-04: qty 1

## 2016-08-04 MED ORDER — METHYLPREDNISOLONE SODIUM SUCC 125 MG IJ SOLR
60.0000 mg | Freq: Two times a day (BID) | INTRAMUSCULAR | Status: DC
  Administered 2016-08-05 (×2): 60 mg via INTRAVENOUS
  Filled 2016-08-04 (×2): qty 2

## 2016-08-04 MED ORDER — IPRATROPIUM-ALBUTEROL 0.5-2.5 (3) MG/3ML IN SOLN
3.0000 mL | Freq: Four times a day (QID) | RESPIRATORY_TRACT | Status: DC
  Filled 2016-08-04: qty 3

## 2016-08-04 MED ORDER — IPRATROPIUM-ALBUTEROL 0.5-2.5 (3) MG/3ML IN SOLN
3.0000 mL | Freq: Four times a day (QID) | RESPIRATORY_TRACT | Status: DC
  Administered 2016-08-04 – 2016-08-06 (×6): 3 mL via RESPIRATORY_TRACT
  Filled 2016-08-04 (×5): qty 3

## 2016-08-04 MED ORDER — HYDROCODONE-ACETAMINOPHEN 5-325 MG PO TABS
1.0000 | ORAL_TABLET | ORAL | Status: DC | PRN
  Administered 2016-08-04 – 2016-08-05 (×2): 1 via ORAL
  Administered 2016-08-06: 2 via ORAL
  Administered 2016-08-06: 1 via ORAL
  Filled 2016-08-04: qty 1
  Filled 2016-08-04: qty 2
  Filled 2016-08-04 (×3): qty 1

## 2016-08-04 NOTE — Progress Notes (Signed)
PROGRESS NOTE    [name]  XKG:[id number] DOB: 1945/12/18 DOA: 08/03/2016 PCP: [name], MD   Brief Narrative:    71yo Vietnamese Male PMHx COPD not O2 dependent, Tobacco Abuse HTN, Diabetes type 2 without complication, HLD, CAD s/p MI, Chronic Hematuria and Dysuria for 8 months to be seen by Urology in 2 weeks  Presenting with intermittent dysuria, abdominal pain.Patient able to understand English, answering with simple yes or no, In addition he reported increasing shortness of breath and  productive white cough, subjective fevers.  Denies rhinorrhea or hemoptysis. Denies fevers, chills, night sweats or mucositis. Reported central chest pain with the cough but no chest wall pain or palpitations.Denies any sick contacts or recent long distant travels.  Has decreased appetite due to current symptoms. Denies nausea or vomiting. Denies dizziness or vertigo. Denies lower extremity swelling. No confusion was reported. Denies any vision changes, double vision or headaches. No tobacco at this time. No ETOH or recreational drugs.     Subjective: 7/28  A/O 4, positive SOB, positive DOE, positive productive cough, positive CP (described as a tight band across chest). States CP started couple days ago. Positive dysuria, positive continued hematuria, positive difficulty initiating urination.     Assessment & Plan:   Active Problems:   Coronary atherosclerosis   OBSTRUCTIVE CHRONIC BRONCHITIS   GERD   STEMI (ST elevation myocardial infarction) ([place])   Hyperlipidemia   Essential hypertension   HTN (hypertension)   SOB (shortness of breath)   Uncontrolled diabetes mellitus without complication (HCC)   Dysuria   Sepsis ([place])   Hematuria   Sepsis unspecified organism/ CAP/ COPD exacerbation   -On admission Patient met criteria of sepsis  Leukocytosis, elevated lactic acid and mild hypoxia. No confusion . -Continue current antibiotics 5 days -Continue Solumedrol 60 mg BID  -Flutter  valve -DuoNeb QID  -Mucinex DM  Lower abdominal pain with chronic hematuria, acute dysuria.  -He reports decreased urine stream  Patient has appt with Urology in 2 weeks (description of symptoms consistent with BPH) -PSA pending -KUB: Nondiagnostic -Bladder scan, if greater than 300 cc residual, will place Urine catheter in view of need for IVF as patient is septic  -urine culture: Negative  -Increase Flomax 0.4 to BID -May also want to add Viagra (ensure patient is not taking NTG if you start)   -If patient in hospital on Monday will consult Urology.   Chronic diastolic CHF   -Patient with active CP most likely secondary to pneumonia but will trend troponin -Strict in and out -Daily weight Filed Weights   08/03/16 0946 08/03/16 1719 08/04/16 0343  Weight: 142 lb (64.4 kg) 173 lb 11.6 oz (78.8 kg) 169 lb 12.1 oz (77 kg)  -Grade I on echo Dec 2016. Appears compensated . Weight 142 lbs  -Echocardiogram pending  Essential Hypertension  -Controlled -Amlodipine 10 mg daily -Continue home anti-hypertensive medications    CAD,  -s/p multiple stens, last in 2015  EKG SR no ACS,  Tn 0   -ASA on hold due to hematuria  -Continue SL NTG   Type II Diabetes uncontrolled with complications -7/02 Hemoglobin A1c= 7.2 -Hold PO diabetic medication -Sensitive SSI  Hyperlipidemia -Lipid panel pending  Hypokalemia -Potassium goal> 4 -K-Dur 50 mEq   DVT prophylaxis: SCD Code Status: Full  Family Communication: None Disposition Plan: TBD   Consultants:  None  Procedures/Significant Events:  7/27 PCXR:-Pneumonia involving the right lower lobe with an associated small right parapneumonic effusion. - Emphysema.  VENTILATOR SETTINGS: None   Cultures 7/27 blood NGTD 7/27 urine negative final 7/27 MRSA by PCR negative    Antimicrobials: Anti-infectives    Start     Stop   08/04/16 1000  cefTRIAXone (ROCEPHIN) 1 g in dextrose 5 % 50 mL IVPB         08/04/16 1000   azithromycin (ZITHROMAX) tablet 250 mg         08/03/16 1000  cefTRIAXone (ROCEPHIN) 1 g in dextrose 5 % 50 mL IVPB     08/03/16 1059   08/03/16 1000  azithromycin (ZITHROMAX) tablet 500 mg     08/03/16 1012       Devices None   LINES / TUBES:  None    Continuous Infusions: . sodium chloride 1,000 mL (08/04/16 0340)  . cefTRIAXone (ROCEPHIN)  IV       Objective: Vitals:   08/04/16 0341 08/04/16 0343 08/04/16 0727 08/04/16 0800  BP: (!) 122/53  132/71 125/75  Pulse: 84  79 85  Resp:   18 18  Temp: 97.8 F (36.6 C)  98 F (36.7 C)   TempSrc: Oral  Oral   SpO2: 98%  96% 96%  Weight:  169 lb 12.1 oz (77 kg)    Height:        Intake/Output Summary (Last 24 hours) at 08/04/16 6767 Last data filed at 08/04/16 0740  Gross per 24 hour  Intake             4655 ml  Output             3510 ml  Net             1145 ml   Filed Weights   08/03/16 0946 08/03/16 1719 08/04/16 0343  Weight: 142 lb (64.4 kg) 173 lb 11.6 oz (78.8 kg) 169 lb 12.1 oz (77 kg)    Examination:  General: A/O 4, positive acute respiratory distress (SPO2 quickly drops when patient exerts himself) Eyes: negative scleral hemorrhage, negative anisocoria, negative icterus ENT: Negative Runny nose, negative gingival bleeding, Neck:  Negative scars, masses, torticollis, lymphadenopathy, JVD Lungs: diffuse decreased breath sounds, positive expiratory wheeze, positive crackles/rhonchi greatest RML/RLL  Cardiovascular: Regular rate and rhythm without murmur gallop or rub normal S1 and S2 Abdomen: Positive suprapubic abdominal pain, nondistended, positive soft, bowel sounds, no rebound, no ascites, no appreciable mass, positive CVA tenderness Lt>> Rt Extremities: No significant cyanosis, clubbing, or edema bilateral lower extremities Skin: Negative rashes, lesions, ulcers Psychiatric:  Negative depression, negative anxiety, negative fatigue, negative mania  Central nervous system:  Cranial nerves II through  XII intact, tongue/uvula midline, all extremities muscle strength 5/5, sensation intact throughout, negative dysarthria, negative expressive aphasia, negative receptive aphasia.  .     Data Reviewed: Care during the described time interval was provided by me .  I have reviewed this patient's available data, including medical history, events of note, physical examination, and all test results as part of my evaluation. I have personally reviewed and interpreted all radiology studies.  CBC:  Recent Labs Lab 08/03/16 0937  WBC 13.6*  NEUTROABS 11.1*  HGB 14.6  HCT 43.7  MCV 88.5  PLT 209   Basic Metabolic Panel:  Recent Labs Lab 08/03/16 0937  NA 138  K 4.0  CL 104  CO2 26  GLUCOSE 129*  BUN 12  CREATININE 1.00  CALCIUM 8.5*   GFR: Estimated Creatinine Clearance: 63.5 mL/min (by C-G formula based on SCr of 1 mg/dL). Liver Function Tests:  Recent Labs Lab 08/03/16 0937  AST 28  ALT 52  ALKPHOS 64  BILITOT 0.8  PROT 6.4*  ALBUMIN 3.1*   No results for input(s): LIPASE, AMYLASE in the last 168 hours. No results for input(s): AMMONIA in the last 168 hours. Coagulation Profile:  Recent Labs Lab 08/03/16 1716  INR 1.08   Cardiac Enzymes: No results for input(s): CKTOTAL, CKMB, CKMBINDEX, TROPONINI in the last 168 hours. BNP (last 3 results) No results for input(s): PROBNP in the last 8760 hours. HbA1C:  Recent Labs  08/03/16 1716  HGBA1C 7.2*   CBG:  Recent Labs Lab 08/03/16 1720 08/03/16 2137 08/04/16 0739  GLUCAP 206* 213* 260*   Lipid Profile: No results for input(s): CHOL, HDL, LDLCALC, TRIG, CHOLHDL, LDLDIRECT in the last 72 hours. Thyroid Function Tests: No results for input(s): TSH, T4TOTAL, FREET4, T3FREE, THYROIDAB in the last 72 hours. Anemia Panel: No results for input(s): VITAMINB12, FOLATE, FERRITIN, TIBC, IRON, RETICCTPCT in the last 72 hours. Urine analysis:    Component Value Date/Time   COLORURINE YELLOW 08/03/2016 0937    APPEARANCEUR HAZY (A) 08/03/2016 0937   LABSPEC 1.013 08/03/2016 0937   PHURINE 5.0 08/03/2016 0937   GLUCOSEU NEGATIVE 08/03/2016 0937   GLUCOSEU NEGATIVE 03/01/2015 1719   HGBUR LARGE (A) 08/03/2016 0937   BILIRUBINUR NEGATIVE 08/03/2016 0937   KETONESUR NEGATIVE 08/03/2016 0937   PROTEINUR 30 (A) 08/03/2016 0937   UROBILINOGEN 0.2 03/01/2015 1719   NITRITE NEGATIVE 08/03/2016 0937   LEUKOCYTESUR SMALL (A) 08/03/2016 0937   Sepsis Labs: @LABRCNTIP (procalcitonin:4,lacticidven:4)  ) Recent Results (from the past 240 hour(s))  MRSA PCR Screening     Status: None   Collection Time: 08/03/16  5:24 PM  Result Value Ref Range Status   MRSA by PCR NEGATIVE NEGATIVE Final    Comment:        The GeneXpert MRSA Assay (FDA approved for NASAL specimens only), is one component of a comprehensive MRSA colonization surveillance program. It is not intended to diagnose MRSA infection nor to guide or monitor treatment for MRSA infections.          Radiology Studies: Abd 1 View (kub)  Result Date: 08/03/2016 CLINICAL DATA:  Sepsis. EXAM: ABDOMEN - 1 VIEW COMPARISON:  None. FINDINGS: The bowel gas pattern is normal. No radio-opaque calculi or other significant radiographic abnormality are seen. Radio- opaque density over the right iliac crest is likely external to the patient. IMPRESSION: Negative. Electronically Signed   By: Titus Dubin M.D.   On: 08/03/2016 11:43   Dg Chest Portable 1 View  Result Date: 08/03/2016 CLINICAL DATA:  Acute onset of mid chest pain and shortness of breath. Current history of hypertension and COPD. EXAM: PORTABLE CHEST 1 VIEW COMPARISON:  06/01/2015, 12/23/2014 and earlier, including CTA chest 04/30/2014. FINDINGS: Cardiac silhouette upper normal in size to slightly enlarged for AP portable technique, unchanged. Thoracic aorta mildly tortuous and atherosclerotic, unchanged. Hilar and mediastinal contours otherwise unremarkable. Emphysematous changes in both  lungs as noted previously. Confluent airspace opacities involving the right lower lobe and patchy airspace opacities in the superior segment right lower lobe associated with a small right pleural effusion. Lungs otherwise clear. No left pleural effusion. Pulmonary vascularity normal. IMPRESSION: 1. Pneumonia involving the right lower lobe with an associated small right parapneumonic effusion. 2.  Emphysema. (ICD10-J43.9) 3.  Aortic Atherosclerosis (ICD10-170.0) Electronically Signed   By: Evangeline Dakin M.D.   On: 08/03/2016 09:58        Scheduled Meds: . amLODipine  10 mg Oral Daily  . azithromycin  250 mg Oral Daily  . guaiFENesin  600 mg Oral BID  . insulin aspart  0-9 Units Subcutaneous TID WC  . methylPREDNISolone (SOLU-MEDROL) injection  60 mg Intravenous Q6H  . mometasone-formoterol  2 puff Inhalation BID  . pantoprazole  40 mg Oral Daily  . potassium chloride SA  20 mEq Oral Daily  . tamsulosin  0.4 mg Oral QPM  . tiotropium  18 mcg Inhalation Daily   Continuous Infusions: . sodium chloride 1,000 mL (08/04/16 0340)  . cefTRIAXone (ROCEPHIN)  IV       LOS: 1 day    Time spent: 40 minutes    WOODS, Geraldo Docker, MD [facility] Pager [phone]   If 7PM-7AM, please contact night-coverage [email or web address] Password [facility] 08/04/2016, 8:32 AM

## 2016-08-05 ENCOUNTER — Inpatient Hospital Stay (HOSPITAL_COMMUNITY): Payer: Medicare HMO

## 2016-08-05 DIAGNOSIS — R1084 Generalized abdominal pain: Secondary | ICD-10-CM

## 2016-08-05 DIAGNOSIS — J441 Chronic obstructive pulmonary disease with (acute) exacerbation: Secondary | ICD-10-CM

## 2016-08-05 DIAGNOSIS — I5032 Chronic diastolic (congestive) heart failure: Secondary | ICD-10-CM

## 2016-08-05 DIAGNOSIS — D72829 Elevated white blood cell count, unspecified: Secondary | ICD-10-CM

## 2016-08-05 DIAGNOSIS — R319 Hematuria, unspecified: Secondary | ICD-10-CM

## 2016-08-05 DIAGNOSIS — I361 Nonrheumatic tricuspid (valve) insufficiency: Secondary | ICD-10-CM

## 2016-08-05 DIAGNOSIS — I2510 Atherosclerotic heart disease of native coronary artery without angina pectoris: Secondary | ICD-10-CM

## 2016-08-05 DIAGNOSIS — E876 Hypokalemia: Secondary | ICD-10-CM

## 2016-08-05 LAB — ECHOCARDIOGRAM COMPLETE
Ao-asc: 37 cm
E decel time: 268 msec
E/e' ratio: 6.48
FS: 36 % (ref 28–44)
HEIGHTINCHES: 64 in
IV/PV OW: 1.18
LA ID, A-P, ES: 34 mm
LA vol A4C: 49.8 ml
LA vol index: 32.1 mL/m2
LA vol: 61.6 mL
LADIAMINDEX: 1.77 cm/m2
LDCA: 2.27 cm2
LEFT ATRIUM END SYS DIAM: 34 mm
LV E/e' medial: 6.48
LV E/e'average: 6.48
LV PW d: 12.2 mm — AB (ref 0.6–1.1)
LV e' LATERAL: 8.38 cm/s
LVOTD: 17 mm
MV Dec: 268
MV pk A vel: 90.7 m/s
MV pk E vel: 54.3 m/s
RV LATERAL S' VELOCITY: 13.8 cm/s
RV TAPSE: 23.3 mm
TDI e' lateral: 8.38
TDI e' medial: 7.4
WEIGHTICAEL: 2793.67 [oz_av]

## 2016-08-05 LAB — COMPREHENSIVE METABOLIC PANEL
ALBUMIN: 2.4 g/dL — AB (ref 3.5–5.0)
ALT: 44 U/L (ref 17–63)
ANION GAP: 9 (ref 5–15)
AST: 21 U/L (ref 15–41)
Alkaline Phosphatase: 100 U/L (ref 38–126)
BILIRUBIN TOTAL: 0.4 mg/dL (ref 0.3–1.2)
BUN: 20 mg/dL (ref 6–20)
CALCIUM: 7.4 mg/dL — AB (ref 8.9–10.3)
CO2: 18 mmol/L — ABNORMAL LOW (ref 22–32)
Chloride: 111 mmol/L (ref 101–111)
Creatinine, Ser: 0.95 mg/dL (ref 0.61–1.24)
GFR calc Af Amer: >60 mL/min (ref >60)
GFR calc non Af Amer: >60 mL/min (ref >60)
Glucose, Bld: 349 mg/dL — ABNORMAL HIGH (ref 65–99)
Potassium: 3.3 mmol/L — ABNORMAL LOW (ref 3.5–5.1)
SODIUM: 138 mmol/L (ref 135–145)
TOTAL PROTEIN: 5.5 g/dL — AB (ref 6.5–8.1)

## 2016-08-05 LAB — CBC WITH DIFFERENTIAL/PLATELET
BASOS PCT: 0 %
Basophils Absolute: 0 10*3/uL (ref 0.0–0.1)
Eosinophils Absolute: 0 10*3/uL (ref 0.0–0.7)
Eosinophils Relative: 0 %
HCT: 37.3 % — ABNORMAL LOW (ref 39.0–52.0)
Hemoglobin: 12.7 g/dL — ABNORMAL LOW (ref 13.0–17.0)
LYMPHS ABS: 0.5 10*3/uL — AB (ref 0.7–4.0)
Lymphocytes Relative: 2 %
MCH: 29.4 pg (ref 26.0–34.0)
MCHC: 34 g/dL (ref 30.0–36.0)
MCV: 86.3 fL (ref 78.0–100.0)
MONO ABS: 0.9 10*3/uL (ref 0.1–1.0)
Monocytes Relative: 4 %
NEUTROS ABS: 22 10*3/uL — AB (ref 1.7–7.7)
Neutrophils Relative %: 94 %
PLATELETS: 282 10*3/uL (ref 150–400)
RBC: 4.32 MIL/uL (ref 4.22–5.81)
RDW: 14.1 % (ref 11.5–15.5)
WBC Morphology: INCREASED
WBC: 23.4 10*3/uL — AB (ref 4.0–10.5)

## 2016-08-05 LAB — GLUCOSE, CAPILLARY
GLUCOSE-CAPILLARY: 296 mg/dL — AB (ref 65–99)
GLUCOSE-CAPILLARY: 241 mg/dL — AB (ref 65–99)
Glucose-Capillary: 222 mg/dL — ABNORMAL HIGH (ref 65–99)
Glucose-Capillary: 280 mg/dL — ABNORMAL HIGH (ref 65–99)
Glucose-Capillary: 247 mg/dL — ABNORMAL HIGH (ref 65–99)

## 2016-08-05 LAB — BRAIN NATRIURETIC PEPTIDE: B Natriuretic Peptide: 371.5 pg/mL — ABNORMAL HIGH (ref 0.0–100.0)

## 2016-08-05 LAB — LIPID PANEL
CHOL/HDL RATIO: 7.1 ratio
Cholesterol: 171 mg/dL (ref 0–200)
HDL: 24 mg/dL — ABNORMAL LOW (ref >40)
LDL Cholesterol: 128 mg/dL — ABNORMAL HIGH (ref 0–99)
Triglycerides: 97 mg/dL (ref <150)
VLDL: 19 mg/dL (ref 0–40)

## 2016-08-05 LAB — TROPONIN I
TROPONIN I: 0.08 ng/mL — AB (ref <0.03)
Troponin I: 0.1 ng/mL (ref <0.03)

## 2016-08-05 LAB — PSA: PROSTATIC SPECIFIC ANTIGEN: 1.18 ng/mL (ref 0.00–4.00)

## 2016-08-05 LAB — MAGNESIUM: MAGNESIUM: 2.1 mg/dL (ref 1.7–2.4)

## 2016-08-05 MED ORDER — METHYLPREDNISOLONE SODIUM SUCC 125 MG IJ SOLR
60.0000 mg | INTRAMUSCULAR | Status: AC
  Administered 2016-08-06: 60 mg via INTRAVENOUS
  Filled 2016-08-05: qty 2

## 2016-08-05 MED ORDER — POTASSIUM CHLORIDE CRYS ER 20 MEQ PO TBCR
60.0000 meq | EXTENDED_RELEASE_TABLET | Freq: Once | ORAL | Status: AC
  Administered 2016-08-05: 60 meq via ORAL
  Filled 2016-08-05: qty 3

## 2016-08-05 MED ORDER — ASPIRIN EC 81 MG PO TBEC
81.0000 mg | DELAYED_RELEASE_TABLET | Freq: Every day | ORAL | Status: DC
  Administered 2016-08-05 – 2016-08-08 (×4): 81 mg via ORAL
  Filled 2016-08-05 (×4): qty 1

## 2016-08-05 MED ORDER — INSULIN ASPART 100 UNIT/ML ~~LOC~~ SOLN
0.0000 [IU] | SUBCUTANEOUS | Status: DC
  Administered 2016-08-05: 11 [IU] via SUBCUTANEOUS
  Administered 2016-08-05 – 2016-08-06 (×3): 7 [IU] via SUBCUTANEOUS
  Administered 2016-08-06 (×2): 3 [IU] via SUBCUTANEOUS

## 2016-08-05 MED ORDER — METOPROLOL TARTRATE 12.5 MG HALF TABLET
12.5000 mg | ORAL_TABLET | Freq: Two times a day (BID) | ORAL | Status: DC
  Administered 2016-08-05 – 2016-08-08 (×7): 12.5 mg via ORAL
  Filled 2016-08-05 (×7): qty 1

## 2016-08-05 NOTE — Significant Event (Signed)
CRITICAL VALUE ALERT  Critical Value: troponin: 0.08  Date & Time Notied:  08/05/16 @ 0689  Provider Notified: Sherral Hammers  Orders Received/Actions taken:

## 2016-08-05 NOTE — Progress Notes (Signed)
  Echocardiogram 2D Echocardiogram has been performed.  [name] 08/05/2016, 9:23 AM

## 2016-08-05 NOTE — Progress Notes (Signed)
PROGRESS NOTE    [name]  OVF:[id number] DOB: 12/25/1945 DOA: 08/03/2016 PCP: [name], MD   Brief Narrative:    71yo Vietnamese Male PMHx COPD not O2 dependent, Tobacco Abuse HTN, Diabetes type 2 without complication, HLD, CAD s/p MI, Chronic Hematuria and Dysuria for 8 months to be seen by Urology in 2 weeks  Presenting with intermittent dysuria, abdominal pain.Patient able to understand English, answering with simple yes or no, In addition he reported increasing shortness of breath and  productive white cough, subjective fevers.  Denies rhinorrhea or hemoptysis. Denies fevers, chills, night sweats or mucositis. Reported central chest pain with the cough but no chest wall pain or palpitations.Denies any sick contacts or recent long distant travels.  Has decreased appetite due to current symptoms. Denies nausea or vomiting. Denies dizziness or vertigo. Denies lower extremity swelling. No confusion was reported. Denies any vision changes, double vision or headaches. No tobacco at this time. No ETOH or recreational drugs.     Subjective: 7/29 A/O 4, continued positive SOB, positive DOE, positive productive cough. States positive extreme pain upon attempting to urinate to the point of positive CP and diaphoresis. Positive suprapubic pain, positive CVA tenderness. States increase in Flomax has not alleviated symptoms. States was a smoker however years ago stopped.       Assessment & Plan:   Active Problems:   Coronary atherosclerosis   OBSTRUCTIVE CHRONIC BRONCHITIS   GERD   STEMI (ST elevation myocardial infarction) ([place])   Hyperlipidemia   Essential hypertension   HTN (hypertension)   SOB (shortness of breath)   Uncontrolled diabetes mellitus without complication (HCC)   Dysuria   Sepsis ([place])   Hematuria   Sepsis unspecified organism/ CAP RLL/ COPD exacerbation   -On admission Patient met criteria of sepsis  Leukocytosis, elevated lactic acid and mild hypoxia. No  confusion . -Continue current antibiotics 5 days -Continue Solumedrol 60 mg daily  -Flutter valve -DuoNeb QID  -Mucinex DM -Titrate O2 to maintain SPO2> 93%  Leukocytosis -Increasing WBC with bands. Untreated infection where?   Lower abdominal pain with chronic hematuria, acute dysuria.  -He reports decreased urine stream  Patient has appt with Urology in 2 weeks (description of symptoms consistent with BPH) -PSA pending -KUB: Nondiagnostic -urine culture: Negative  -Flomax 0.4 mg BID -May also want to add Viagra (ensure patient is not taking NTG if you start)   -Even with increase in Flomax patient continues to have severe lower abdominal pain with urination obtain CT abdomen and pelvis. -If patient in hospital on Monday will consult Urology.   Chronic diastolic CHF   -Patient with active CP most likely secondary to pneumonia but will trend troponin (somewhat improved today) -Strict in and out since admission +2.1 L -Daily weight Filed Weights   08/03/16 1719 08/04/16 0343 08/05/16 0327  Weight: 173 lb 11.6 oz (78.8 kg) 169 lb 12.1 oz (77 kg) 174 lb 9.7 oz (79.2 kg)  -Grade I on echo Dec 2016. Appears compensated . Weight 142 lbs  -Echocardiogram: Consistent with diastolic CHF -Amlodipine 10 mg daily -Start aspirin 81 mg daily -Start low-dose metoprolol 12.5 mg BID   Essential Hypertension  -See  CHF  CAD,  -s/p multiple stens, last in 2015  EKG SR no ACS,  Tn 0   -Will restart ASA, as hematuria not as large issue as CAD.   -Continue SL NTG   Chest pain noncardiac? -Cycle troponin -CT chest pending  Type II Diabetes uncontrolled with complications -8/41 Hemoglobin  A1c= 7.2 -Hold PO diabetic medication -Resistant SSI  Hyperlipidemia -Lipid panel pending  Hypokalemia -Potassium goal> 4 -K-Dur 60 mEq   DVT prophylaxis: SCD Code Status: Full  Family Communication: None Disposition Plan: TBD   Consultants:  None  Procedures/Significant Events:  7/27  PCXR:-Pneumonia involving the right lower lobe with an associated small right parapneumonic effusion. - Emphysema.  7/29 Echocardiogram:- Left ventricle: moderate LVH. -LVEF  = 55% to 60%.  -(grade 1 diastolic dysfunction).    VENTILATOR SETTINGS: None   Cultures 7/27 blood NGTD 7/27 urine negative final 7/27 MRSA by PCR negative    Antimicrobials: Anti-infectives    Start     Stop   08/04/16 1000  cefTRIAXone (ROCEPHIN) 1 g in dextrose 5 % 50 mL IVPB         08/04/16 1000  azithromycin (ZITHROMAX) tablet 250 mg         08/03/16 1000  cefTRIAXone (ROCEPHIN) 1 g in dextrose 5 % 50 mL IVPB     08/03/16 1059   08/03/16 1000  azithromycin (ZITHROMAX) tablet 500 mg     08/03/16 1012       Devices None   LINES / TUBES:  None    Continuous Infusions: . sodium chloride 1,000 mL (08/05/16 1258)  . cefTRIAXone (ROCEPHIN)  IV Stopped (08/05/16 1008)     Objective: Vitals:   08/05/16 0327 08/05/16 0731 08/05/16 0938 08/05/16 1201  BP: (!) 119/50 (!) 110/58 120/74   Pulse: 94 88    Resp: 20 19    Temp: 97.6 F (36.4 C) 97.6 F (36.4 C)    TempSrc: Oral Oral    SpO2: 94% 95%  95%  Weight: 174 lb 9.7 oz (79.2 kg)     Height:        Intake/Output Summary (Last 24 hours) at 08/05/16 1320 Last data filed at 08/05/16 0938  Gross per 24 hour  Intake             2106 ml  Output             1550 ml  Net              556 ml   Filed Weights   08/03/16 1719 08/04/16 0343 08/05/16 0327  Weight: 173 lb 11.6 oz (78.8 kg) 169 lb 12.1 oz (77 kg) 174 lb 9.7 oz (79.2 kg)    Examination:   Physical Exam: Vitals:   08/05/16 0327 08/05/16 0731 08/05/16 0938 08/05/16 1201  BP: (!) 119/50 (!) 110/58 120/74   Pulse: 94 88    Resp: 20 19    Temp: 97.6 F (36.4 C) 97.6 F (36.4 C)    TempSrc: Oral Oral    SpO2: 94% 95%  95%  Weight: 174 lb 9.7 oz (79.2 kg)     Height:        Wt Readings from Last 3 Encounters:  08/05/16 174 lb 9.7 oz (79.2 kg)  06/01/15 140 lb  (63.5 kg)  04/08/15 143 lb 3.2 oz (65 kg)    General: A/O 4, positive acute respiratory distress, (SPO2 drops to ~90% when patient talks)  Lungs: diffuse poor air movement, positive mild expiratory wheeze, negative crackles  Cardiovascular: Regular rate and rhythm without murmur gallop or rub normal S1 and S2 Abdomen: negative abdominal pain, nondistended, positive soft, bowel sounds, no rebound, no ascites, no appreciable mass, positive moderate-severe abdominal pain with palpation suprapubic area, moderate-severe CVA tenderness Lt >>Rt Extremities: No significant cyanosis, clubbing, or edema bilateral  lower extremities Skin: Negative rashes, lesions, ulcers Psychiatric:  Negative depression, negative anxiety, negative fatigue, negative mania  Central nervous system:  Cranial nerves II through XII intact, tongue/uvula midline, all extremities muscle strength 5/5, sensation intact negative dysarthria, negative expressive aphasia, negative receptive aphasia.       Data Reviewed: Care during the described time interval was provided by me .  I have reviewed this patient's available data, including medical history, events of note, physical examination, and all test results as part of my evaluation. I have personally reviewed and interpreted all radiology studies.  CBC:  Recent Labs Lab 08/03/16 0937 08/04/16 0848 08/05/16 0255  WBC 13.6* 20.3* 23.4*  NEUTROABS 11.1* 19.0* 22.0*  HGB 14.6 13.6 12.7*  HCT 43.7 38.9* 37.3*  MCV 88.5 86.8 86.3  PLT 291 267 168   Basic Metabolic Panel:  Recent Labs Lab 08/03/16 0937 08/04/16 0848 08/05/16 0255  NA 138 137 138  K 4.0 3.2* 3.3*  CL 104 108 111  CO2 26 21* 18*  GLUCOSE 129* 237* 349*  BUN 12 14 20   CREATININE 1.00 0.78 0.95  CALCIUM 8.5* 7.5* 7.4*  MG  --   --  2.1   GFR: Estimated Creatinine Clearance: 67.8 mL/min (by C-G formula based on SCr of 0.95 mg/dL). Liver Function Tests:  Recent Labs Lab 08/03/16 0937  08/05/16 0255  AST 28 21  ALT 52 44  ALKPHOS 64 100  BILITOT 0.8 0.4  PROT 6.4* 5.5*  ALBUMIN 3.1* 2.4*   No results for input(s): LIPASE, AMYLASE in the last 168 hours. No results for input(s): AMMONIA in the last 168 hours. Coagulation Profile:  Recent Labs Lab 08/03/16 1716  INR 1.08   Cardiac Enzymes: No results for input(s): CKTOTAL, CKMB, CKMBINDEX, TROPONINI in the last 168 hours. BNP (last 3 results) No results for input(s): PROBNP in the last 8760 hours. HbA1C:  Recent Labs  08/03/16 1716  HGBA1C 7.2*   CBG:  Recent Labs Lab 08/04/16 1225 08/04/16 1644 08/04/16 2104 08/05/16 0733 08/05/16 1211  GLUCAP 300* 254* 231* 296* 222*   Lipid Profile:  Recent Labs  08/05/16 0255  CHOL 171  HDL 24*  LDLCALC 128*  TRIG 97  CHOLHDL 7.1   Thyroid Function Tests: No results for input(s): TSH, T4TOTAL, FREET4, T3FREE, THYROIDAB in the last 72 hours. Anemia Panel: No results for input(s): VITAMINB12, FOLATE, FERRITIN, TIBC, IRON, RETICCTPCT in the last 72 hours. Urine analysis:    Component Value Date/Time   COLORURINE YELLOW 08/03/2016 0937   APPEARANCEUR HAZY (A) 08/03/2016 0937   LABSPEC 1.013 08/03/2016 0937   PHURINE 5.0 08/03/2016 0937   GLUCOSEU NEGATIVE 08/03/2016 0937   GLUCOSEU NEGATIVE 03/01/2015 1719   HGBUR LARGE (A) 08/03/2016 0937   BILIRUBINUR NEGATIVE 08/03/2016 0937   KETONESUR NEGATIVE 08/03/2016 0937   PROTEINUR 30 (A) 08/03/2016 0937   UROBILINOGEN 0.2 03/01/2015 1719   NITRITE NEGATIVE 08/03/2016 0937   LEUKOCYTESUR SMALL (A) 08/03/2016 0937   Sepsis Labs: @LABRCNTIP (procalcitonin:4,lacticidven:4)  ) Recent Results (from the past 240 hour(s))  Culture, blood (Routine x 2)     Status: None (Preliminary result)   Collection Time: 08/03/16  9:06 AM  Result Value Ref Range Status   Specimen Description BLOOD LEFT ANTECUBITAL  Final   Special Requests   Final    BOTTLES DRAWN AEROBIC AND ANAEROBIC Blood Culture adequate  volume   Culture NO GROWTH 1 DAY  Final   Report Status PENDING  Incomplete  Urine culture  Status: None   Collection Time: 08/03/16  9:37 AM  Result Value Ref Range Status   Specimen Description URINE, RANDOM  Final   Special Requests NONE  Final   Culture NO GROWTH  Final   Report Status 08/04/2016 FINAL  Final  Culture, blood (Routine x 2)     Status: None (Preliminary result)   Collection Time: 08/03/16 10:00 AM  Result Value Ref Range Status   Specimen Description BLOOD RIGHT ANTECUBITAL  Final   Special Requests   Final    BOTTLES DRAWN AEROBIC AND ANAEROBIC Blood Culture adequate volume   Culture NO GROWTH 1 DAY  Final   Report Status PENDING  Incomplete  MRSA PCR Screening     Status: None   Collection Time: 08/03/16  5:24 PM  Result Value Ref Range Status   MRSA by PCR NEGATIVE NEGATIVE Final    Comment:        The GeneXpert MRSA Assay (FDA approved for NASAL specimens only), is one component of a comprehensive MRSA colonization surveillance program. It is not intended to diagnose MRSA infection nor to guide or monitor treatment for MRSA infections.          Radiology Studies: No results found.      Scheduled Meds: . amLODipine  10 mg Oral Daily  . azithromycin  250 mg Oral Daily  . guaiFENesin  600 mg Oral BID  . insulin aspart  0-9 Units Subcutaneous TID WC  . ipratropium-albuterol  3 mL Nebulization Q6H WA  . methylPREDNISolone (SOLU-MEDROL) injection  60 mg Intravenous Q12H  . mometasone-formoterol  2 puff Inhalation BID  . pantoprazole  40 mg Oral Daily  . potassium chloride SA  20 mEq Oral Daily  . tamsulosin  0.4 mg Oral BID   Continuous Infusions: . sodium chloride 1,000 mL (08/05/16 1258)  . cefTRIAXone (ROCEPHIN)  IV Stopped (08/05/16 1008)     LOS: 2 days    Time spent: 40 minutes    WOODS, Geraldo Docker, MD [facility] Pager [phone]   If 7PM-7AM, please contact night-coverage [email or web address] Password  [email or web address] 08/05/2016, 1:20 PM

## 2016-08-06 DIAGNOSIS — R1030 Lower abdominal pain, unspecified: Secondary | ICD-10-CM

## 2016-08-06 LAB — GLUCOSE, CAPILLARY
GLUCOSE-CAPILLARY: 128 mg/dL — AB (ref 65–99)
GLUCOSE-CAPILLARY: 307 mg/dL — AB (ref 65–99)
Glucose-Capillary: 121 mg/dL — ABNORMAL HIGH (ref 65–99)
Glucose-Capillary: 75 mg/dL (ref 65–99)
Glucose-Capillary: 222 mg/dL — ABNORMAL HIGH (ref 65–99)

## 2016-08-06 LAB — TROPONIN I: Troponin I: 0.05 ng/mL (ref <0.03)

## 2016-08-06 LAB — MAGNESIUM: MAGNESIUM: 2.3 mg/dL (ref 1.7–2.4)

## 2016-08-06 MED ORDER — ASPIRIN 81 MG PO TABS: 81.0000 mg | ORAL_TABLET | Freq: Every day | ORAL | Status: DC

## 2016-08-06 MED ORDER — INSULIN ASPART 100 UNIT/ML ~~LOC~~ SOLN
0.0000 [IU] | Freq: Three times a day (TID) | SUBCUTANEOUS | Status: DC
  Administered 2016-08-07: 3 [IU] via SUBCUTANEOUS
  Administered 2016-08-07: 11 [IU] via SUBCUTANEOUS
  Administered 2016-08-07: 3 [IU] via SUBCUTANEOUS
  Administered 2016-08-08: 20 [IU] via SUBCUTANEOUS

## 2016-08-06 MED ORDER — ALBUTEROL SULFATE (2.5 MG/3ML) 0.083% IN NEBU: 2.5000 mg | INHALATION_SOLUTION | RESPIRATORY_TRACT | Status: DC | PRN

## 2016-08-06 MED ORDER — IPRATROPIUM-ALBUTEROL 0.5-2.5 (3) MG/3ML IN SOLN: 3.0000 mL | Freq: Four times a day (QID) | RESPIRATORY_TRACT | Status: DC | PRN

## 2016-08-06 MED ORDER — PREDNISONE 20 MG PO TABS
30.0000 mg | ORAL_TABLET | Freq: Every day | ORAL | Status: DC
  Administered 2016-08-07 – 2016-08-08 (×2): 30 mg via ORAL
  Filled 2016-08-06 (×2): qty 1

## 2016-08-06 MED ORDER — BETHANECHOL CHLORIDE 10 MG PO TABS
10.0000 mg | ORAL_TABLET | Freq: Four times a day (QID) | ORAL | Status: DC
  Administered 2016-08-06 – 2016-08-07 (×2): 10 mg via ORAL
  Filled 2016-08-06 (×4): qty 1

## 2016-08-06 MED ORDER — INSULIN ASPART 100 UNIT/ML ~~LOC~~ SOLN
0.0000 [IU] | Freq: Every day | SUBCUTANEOUS | Status: DC
  Administered 2016-08-06: 4 [IU] via SUBCUTANEOUS

## 2016-08-06 NOTE — Progress Notes (Signed)
[place] TEAM 1 - Stepdown/ICU TEAM  [name]  TMH:[id number] DOB: Apr 13, 1945 DOA: 08/03/2016 PCP: [name], MD    Brief Narrative:  71yo Vietnamese Male w/ Hx COPD not O2 dependent, Tobacco Abuse, HTN, DM2, HLD, CAD s/p MI, and Chronic Hematuria and Dysuria for 8 months to be seen by Urology in 2 weeks who presented with intermittent dysuria and abdominal pain as well as increasing shortness of breath, productive cough, and fevers.   Subjective: The patient complains of ongoing crampy suprapubic pain.  He states his pain is much worse when he attempts to urinate.  He denies shortness of breath chest pain nausea vomiting or diarrhea.  Assessment & Plan:  Sepsis due to right lower lobe pneumonia Continue empiric antibiotic therapy - fever curve trending down - white count remains elevated due to use of steroids  Acute COPD exacerbation Much improved/essentially resolved - no further wheezing on exam - wean steroids rapidly  Acute dysuria with chronic hematuria - lower abdominal pain Has appointment with the urologist in 2 weeks - symptoms most consistent with BPH - KUB unrevealing - no evidence of UTI - Flomax initiated - CT abdomen and pelvis notes normal appearing kidneys and bladder with no comment on prostate size - add urecholine and follow - if persists will request Urology consult   Chronic mild grade 1 diastolic CHF Noted via TTE 2016 - no evidence of volume overload at this time   HTN Follow BP w/o change today   CAD Status post multiple stents with most recent 2015  DM 2 A1c 7.2 - CBG variable - follow w/o change today   HLD  Hypokalemia Recheck in AM   DVT prophylaxis: SCDs Code Status: FULL CODE Family Communication: no family present at time of exam  Disposition Plan: transfer to medical bed - ambulate - watch urinary sx - follow pulm sx w/ ambulation  Consultants:  none  Procedures: none  Antimicrobials:  Azithromycin 7/27 > Ceftriaxone 7/27  >  Objective: Blood pressure (!) 145/74, pulse 81, temperature 98.3 F (36.8 C), temperature source Oral, resp. rate 20, height 5\' 4"  (1.626 m), weight 79.2 kg (174 lb 9.7 oz), SpO2 97 %.  Intake/Output Summary (Last 24 hours) at 08/06/16 1726 Last data filed at 08/06/16 1617  Gross per 24 hour  Intake             2180 ml  Output             2895 ml  Net             -715 ml   Filed Weights   08/03/16 1719 08/04/16 0343 08/05/16 0327  Weight: 78.8 kg (173 lb 11.6 oz) 77 kg (169 lb 12.1 oz) 79.2 kg (174 lb 9.7 oz)    Examination: General: No acute respiratory distress at rest in bed  Lungs: Crackles in right base - no wheezing - good air movement throughout other fields Cardiovascular: Regular rate and rhythm without murmur gallop or rub normal S1 and S2 Abdomen: Tender to palpation in midline suprapubic region with no appreciable mass - nontender/non-distended throughout remainder of abdomen - bowel sounds positive - no rebound Extremities: No significant cyanosis, clubbing, or edema bilateral lower extremities  CBC:  Recent Labs Lab 08/03/16 0937 08/04/16 0848 08/05/16 0255  WBC 13.6* 20.3* 23.4*  NEUTROABS 11.1* 19.0* 22.0*  HGB 14.6 13.6 12.7*  HCT 43.7 38.9* 37.3*  MCV 88.5 86.8 86.3  PLT 291 267 921   Basic Metabolic Panel:  Recent Labs Lab 08/03/16 0937 08/04/16 0848 08/05/16 0255 08/06/16 0252  NA 138 137 138  --   K 4.0 3.2* 3.3*  --   CL 104 108 111  --   CO2 26 21* 18*  --   GLUCOSE 129* 237* 349*  --   BUN 12 14 20   --   CREATININE 1.00 0.78 0.95  --   CALCIUM 8.5* 7.5* 7.4*  --   MG  --   --  2.1 2.3   GFR: Estimated Creatinine Clearance: 67.8 mL/min (by C-G formula based on SCr of 0.95 mg/dL).  Liver Function Tests:  Recent Labs Lab 08/03/16 0937 08/05/16 0255  AST 28 21  ALT 52 44  ALKPHOS 64 100  BILITOT 0.8 0.4  PROT 6.4* 5.5*  ALBUMIN 3.1* 2.4*    Coagulation Profile:  Recent Labs Lab 08/03/16 1716  INR 1.08    Cardiac  Enzymes:  Recent Labs Lab 08/05/16 1638 08/05/16 2053 08/06/16 0252  TROPONINI 0.08* 0.10* 0.05*    HbA1C: Hgb A1c MFr Bld  Date/Time Value Ref Range Status  08/03/2016 05:16 PM 7.2 (H) 4.8 - 5.6 % Final    Comment:    (NOTE)         Pre-diabetes: 5.7 - 6.4         Diabetes: >6.4         Glycemic control for adults with diabetes: <7.0   06/01/2015 09:17 AM 7.9 (H) 4.8 - 5.6 % Final    Comment:    (NOTE)         Pre-diabetes: 5.7 - 6.4         Diabetes: >6.4         Glycemic control for adults with diabetes: <7.0     CBG:  Recent Labs Lab 08/05/16 2320 08/06/16 0338 08/06/16 0731 08/06/16 1220 08/06/16 1615  GLUCAP 241* 121* 75 222* 128*    Recent Results (from the past 240 hour(s))  Culture, blood (Routine x 2)     Status: None (Preliminary result)   Collection Time: 08/03/16  9:06 AM  Result Value Ref Range Status   Specimen Description BLOOD LEFT ANTECUBITAL  Final   Special Requests   Final    BOTTLES DRAWN AEROBIC AND ANAEROBIC Blood Culture adequate volume   Culture NO GROWTH 3 DAYS  Final   Report Status PENDING  Incomplete  Urine culture     Status: None   Collection Time: 08/03/16  9:37 AM  Result Value Ref Range Status   Specimen Description URINE, RANDOM  Final   Special Requests NONE  Final   Culture NO GROWTH  Final   Report Status 08/04/2016 FINAL  Final  Culture, blood (Routine x 2)     Status: None (Preliminary result)   Collection Time: 08/03/16 10:00 AM  Result Value Ref Range Status   Specimen Description BLOOD RIGHT ANTECUBITAL  Final   Special Requests   Final    BOTTLES DRAWN AEROBIC AND ANAEROBIC Blood Culture adequate volume   Culture NO GROWTH 3 DAYS  Final   Report Status PENDING  Incomplete  MRSA PCR Screening     Status: None   Collection Time: 08/03/16  5:24 PM  Result Value Ref Range Status   MRSA by PCR NEGATIVE NEGATIVE Final    Comment:        The GeneXpert MRSA Assay (FDA approved for NASAL specimens only), is  one component of a comprehensive MRSA colonization surveillance program. It is not intended  to diagnose MRSA infection nor to guide or monitor treatment for MRSA infections.      Scheduled Meds: . amLODipine  10 mg Oral Daily  . aspirin EC  81 mg Oral Daily  . azithromycin  250 mg Oral Daily  . guaiFENesin  600 mg Oral BID  . insulin aspart  0-20 Units Subcutaneous Q4H  . methylPREDNISolone (SOLU-MEDROL) injection  60 mg Intravenous Q24H  . metoprolol tartrate  12.5 mg Oral BID  . mometasone-formoterol  2 puff Inhalation BID  . pantoprazole  40 mg Oral Daily  . potassium chloride SA  20 mEq Oral Daily  . tamsulosin  0.4 mg Oral BID     LOS: 3 days   Cherene Altes, MD [facility] Office  [phone] Pager - Text Page per Amion as per below:  On-Call/Text Page:      [email or web address]      password [email or web address]  If 7PM-7AM, please contact night-coverage [email or web address] Password [email or web address] 08/06/2016, 5:26 PM

## 2016-08-06 NOTE — Telephone Encounter (Signed)
Rx has been sent to the pharmacy electronically. ° °

## 2016-08-06 NOTE — Progress Notes (Addendum)
Patient c/o discomfort at abdomen; taut/distended with hyperactive BS. Pt walked to restroom able to pass flatus but no BM.  1500 7/30 - pt reports small BM not seen by nursing.

## 2016-08-07 LAB — BASIC METABOLIC PANEL
ANION GAP: 6 (ref 5–15)
BUN: 18 mg/dL (ref 6–20)
CALCIUM: 8.3 mg/dL — AB (ref 8.9–10.3)
CO2: 27 mmol/L (ref 22–32)
Chloride: 106 mmol/L (ref 101–111)
Creatinine, Ser: 0.85 mg/dL (ref 0.61–1.24)
GFR calc Af Amer: >60 mL/min (ref >60)
GLUCOSE: 187 mg/dL — AB (ref 65–99)
Potassium: 4.1 mmol/L (ref 3.5–5.1)
SODIUM: 139 mmol/L (ref 135–145)

## 2016-08-07 LAB — CBC
HCT: 42.6 % (ref 39.0–52.0)
Hemoglobin: 14.1 g/dL (ref 13.0–17.0)
MCH: 29 pg (ref 26.0–34.0)
MCHC: 33.1 g/dL (ref 30.0–36.0)
MCV: 87.5 fL (ref 78.0–100.0)
PLATELETS: 329 10*3/uL (ref 150–400)
RBC: 4.87 MIL/uL (ref 4.22–5.81)
RDW: 14.3 % (ref 11.5–15.5)
WBC: 11.9 10*3/uL — AB (ref 4.0–10.5)

## 2016-08-07 LAB — CREATININE, URINE, RANDOM: Creatinine, Urine: 26.91 mg/dL

## 2016-08-07 LAB — GLUCOSE, CAPILLARY
GLUCOSE-CAPILLARY: 287 mg/dL — AB (ref 65–99)
Glucose-Capillary: 145 mg/dL — ABNORMAL HIGH (ref 65–99)
Glucose-Capillary: 144 mg/dL — ABNORMAL HIGH (ref 65–99)
Glucose-Capillary: 85 mg/dL (ref 65–99)

## 2016-08-07 LAB — OSMOLALITY, URINE: OSMOLALITY UR: 586 mosm/kg (ref 300–900)

## 2016-08-07 LAB — MAGNESIUM: Magnesium: 2.1 mg/dL (ref 1.7–2.4)

## 2016-08-07 LAB — SODIUM, URINE, RANDOM: Sodium, Ur: 127 mmol/L

## 2016-08-07 MED ORDER — ROSUVASTATIN CALCIUM 20 MG PO TABS
20.0000 mg | ORAL_TABLET | Freq: Every day | ORAL | Status: DC
  Administered 2016-08-07: 20 mg via ORAL
  Filled 2016-08-07: qty 1

## 2016-08-07 NOTE — Progress Notes (Signed)
Received report from [name],RN on [address] [place]. Patient arrived to unit in wheelchair. Vital signs stable and patient resting comfortably in bed. No complaints of pain at this time. Will continue to monitor and treat per MD orders.

## 2016-08-07 NOTE — Progress Notes (Signed)
SATURATION QUALIFICATIONS: (This note is used to comply with regulatory documentation for home oxygen)  Patient Saturations on Room Air at Rest = 92%  Patient Saturations on Room Air while Ambulating = 91%    

## 2016-08-07 NOTE — Plan of Care (Signed)
Problem: Safety: Goal: Ability to remain free from injury will improve Outcome: Progressing Patient will verbalize understanding of the importance of calling for assistance before attempting to ambulate.

## 2016-08-07 NOTE — Evaluation (Signed)
Physical Therapy Evaluation Patient Details Name: [name] MRN: [id number] DOB: 06-May-1945 Today's Date: 08/07/2016   History of Present Illness  [name] is a 71 y.o. male with history of COPD, tobacco abuse, hypertension, diabetes mellitus, CAD status post MI, chronic hematuria and dysuria. Patient presents with dyspnea and abdominal pain and was found to have pneumonia in addition to COPD exacerbation.  Clinical Impression  Patient is functioning at Mod I level with mobility and gait.  No loss of balance during gait.  Reviewed pursed-lip breathing for minimizing dyspnea.  No further PT needs identified.  PT will sign off.    Follow Up Recommendations No PT follow up;Supervision - Intermittent    Equipment Recommendations  None recommended by PT    Recommendations for Other Services       Precautions / Restrictions Precautions Precautions: None Restrictions Weight Bearing Restrictions: No      Mobility  Bed Mobility Overal bed mobility: Independent                Transfers Overall transfer level: Modified independent               General transfer comment: Increased time  Ambulation/Gait Ambulation/Gait assistance: Modified independent (Device/Increase time) Ambulation Distance (Feet): 375 Feet Assistive device: Straight cane Gait Pattern/deviations: Step-through pattern;Decreased stride length   Gait velocity interpretation: at or above normal speed for age/gender General Gait Details: Safe use of cane.  Cues to slow to safe speed.  No loss of balance during gait.  Stairs            Wheelchair Mobility    Modified Rankin (Stroke Patients Only)       Balance Overall balance assessment: Modified Independent                                           Pertinent Vitals/Pain Pain Assessment: Faces Faces Pain Scale: Hurts a little bit Pain Location: Lower abdomen Pain Descriptors / Indicators: Grimacing Pain  Intervention(s): Monitored during session    Home Living Family/patient expects to be discharged to:: Private residence Living Arrangements: Spouse/significant other;Children Available Help at Discharge: Family;Available 24 hours/day Type of Home: House Home Access: Level entry     Home Layout: One level Home Equipment: Walker - 2 wheels;Cane - single point      Prior Function Level of Independence: Independent with assistive device(s)         Comments: Patient uses cane occasionally for gait.  Independent with ADL's.  Drives.     Hand Dominance        Extremity/Trunk Assessment   Upper Extremity Assessment Upper Extremity Assessment: Overall WFL for tasks assessed    Lower Extremity Assessment Lower Extremity Assessment: Overall WFL for tasks assessed       Communication   Communication: Prefers language other than English (Is able to speak/understand [name])  Cognition Arousal/Alertness: Awake/alert Behavior During Therapy: WFL for tasks assessed/performed Overall Cognitive Status: Within Functional Limits for tasks assessed                                        General Comments      Exercises     Assessment/Plan    PT Assessment Patent does not need any further PT services  PT Problem List  PT Treatment Interventions      PT Goals (Current goals can be found in the Care Plan section)  Acute Rehab PT Goals PT Goal Formulation: All assessment and education complete, DC therapy    Frequency     Barriers to discharge        Co-evaluation               AM-PAC PT "6 Clicks" Daily Activity  Outcome Measure Difficulty turning over in bed (including adjusting bedclothes, sheets and blankets)?: None Difficulty moving from lying on back to sitting on the side of the bed? : None Difficulty sitting down on and standing up from a chair with arms (e.g., wheelchair, bedside commode, etc,.)?: None Help needed moving to and  from a bed to chair (including a wheelchair)?: None Help needed walking in hospital room?: None Help needed climbing 3-5 steps with a railing? : A Little 6 Click Score: 23    End of Session   Activity Tolerance: Patient tolerated treatment well (DOE - 3/4) Patient left: in chair;with call bell/phone within reach Nurse Communication: Mobility status (No PT needs.  Encouraged ambulation in hallway with nursing) PT Visit Diagnosis: Muscle weakness (generalized) (M62.81)    Time: 9311-2162 PT Time Calculation (min) (ACUTE ONLY): 19 min   Charges:   PT Evaluation $PT Eval Moderate Complexity: 1 Mod     PT G Codes:        [name], [facility] Acute Rehab Services Pager [phone]   [name] 08/07/2016, 3:47 PM

## 2016-08-07 NOTE — Consult Note (Signed)
           [facility] CM Primary Care Navigator  08/07/2016  [name] 1945/11/16 [id number]   Went to see patient at the bedside to identify possible discharge needs.  Patient has confirmed that his primary care provider is Dr. [name] with [facility] which is not under [facility] network.   Patient was encouraged to follow-up with PCP after hospital discharge. Patient voiced understanding to follow-up with his primary care provider when he returns home.  For questions, please contact:  Dannielle Huh, BSN, RN- [facility] Primary Care Navigator  Telephone: [phone] [name]

## 2016-08-07 NOTE — Care Management Important Message (Signed)
Important Message  Patient Details  Name: [name] MRN: [id number] Date of Birth: 1945/10/17   Medicare Important Message Given:  Yes    [name] 08/07/2016, 10:06 AM

## 2016-08-07 NOTE — Progress Notes (Signed)
PROGRESS NOTE    [name]  MWN:[id number] DOB: [birth year] DOA: 08/03/2016 PCP: [name], MD   Brief Narrative: [name] is a 71 y.o. male with history of COPD, tobacco abuse, hypertension, diabetes mellitus, CAD status post MI, chronic hematuria and dysuria. Patient presents with dyspnea and abdominal pain and was found to have pneumonia in addition to COPD exacerbation. He has been treated with antibiotics for his pneumonia and with steroids for COPD exacerbation. He has developed some significant polyuria; will get urine tests in the morning in addition to restrict fluid overnight.   Assessment & Plan:   Active Problems:   Coronary atherosclerosis   OBSTRUCTIVE CHRONIC BRONCHITIS   GERD   STEMI (ST elevation myocardial infarction) ([place])   Hyperlipidemia   Essential hypertension   HTN (hypertension)   SOB (shortness of breath)   Uncontrolled diabetes mellitus without complication (HCC)   Dysuria   Sepsis ([place])   Hematuria   Sepsis Secondary to right lower lobe pneumonia. Physiology improved. -Continue Ceftriaxone -discontinue Azithromycin  Community acquired pneumonia Management above  Acute COPD exacerbation Improved -continue steroids  Acute dysuria Abdominal pain History of BPH. Imaging unrevealing. No UTI. Started on Flomax. Significant diuresis. -Ccontinue Flomax -Hold urecholine -outpatient follow-up with Urology  Polyuria Present prior to admission. Significant diuresis. >5L UOP yesterday. ?DI. Sodium normal. -fluid restrict overnight -urine osmolality/creatinine in AM -serum osmolality in AM  Chronic diastolic heart failure Euvolemic.  Essential hypertension Elevated blood pressure -continue amlodipine -continue metoprolol; increase if still uncontrolled  CAD S/p multiple stents -continue Crestor  Diabetes mellitus, type 2 -continue sliding scale insulin  Hyperlipidemia -continue Crestor  Hypokalemia Resolved with  repletion.   DVT prophylaxis: SCDs Code Status: Full code Family Communication: None at bedside Disposition Plan: Discharge in 24-48 hours; PT eval   Consultants:   None  Procedures:   None  Antimicrobials:  Ceftriaxone  Azithromycin    Subjective: Continues to have significant dysuria and polyuria.  Objective: Vitals:   08/06/16 2058 08/07/16 0559 08/07/16 0911 08/07/16 0939  BP: (!) 148/78 (!) 157/88  (!) 159/85  Pulse: 83 78  88  Resp: (!) 22 20    Temp: 97.6 F (36.4 C) 98.1 F (36.7 C)    TempSrc: Oral Oral    SpO2: 96% 96% 96%   Weight:      Height:        Intake/Output Summary (Last 24 hours) at 08/07/16 1107 Last data filed at 08/07/16 0952  Gross per 24 hour  Intake             1410 ml  Output             5620 ml  Net            -4210 ml   Filed Weights   08/03/16 1719 08/04/16 0343 08/05/16 0327  Weight: 78.8 kg (173 lb 11.6 oz) 77 kg (169 lb 12.1 oz) 79.2 kg (174 lb 9.7 oz)    Examination:  General exam: Appears calm and comfortable Respiratory system: Clear to auscultation. Respiratory effort normal. Cardiovascular system: S1 & S2 heard, RRR. No murmurs, rubs, gallops or clicks. Gastrointestinal system: Abdomen is nondistended, soft and nontender. No organomegaly or masses felt. Normal bowel sounds heard. Central nervous system: Alert and oriented. No focal neurological deficits. Extremities: No edema. No calf tenderness Skin: No cyanosis. No rashes Psychiatry: Judgement and insight appear normal. Mood & affect appropriate.     Data Reviewed: I have personally reviewed following labs and  imaging studies  CBC:  Recent Labs Lab 08/03/16 0937 08/04/16 0848 08/05/16 0255 08/07/16 0951  WBC 13.6* 20.3* 23.4* 11.9*  NEUTROABS 11.1* 19.0* 22.0*  --   HGB 14.6 13.6 12.7* 14.1  HCT 43.7 38.9* 37.3* 42.6  MCV 88.5 86.8 86.3 87.5  PLT 291 267 282 761   Basic Metabolic Panel:  Recent Labs Lab 08/03/16 0937 08/04/16 0848  08/05/16 0255 08/06/16 0252 08/07/16 0357  NA 138 137 138  --  139  K 4.0 3.2* 3.3*  --  4.1  CL 104 108 111  --  106  CO2 26 21* 18*  --  27  GLUCOSE 129* 237* 349*  --  187*  BUN 12 14 20   --  18  CREATININE 1.00 0.78 0.95  --  0.85  CALCIUM 8.5* 7.5* 7.4*  --  8.3*  MG  --   --  2.1 2.3 2.1   GFR: Estimated Creatinine Clearance: 75.8 mL/min (by C-G formula based on SCr of 0.85 mg/dL). Liver Function Tests:  Recent Labs Lab 08/03/16 0937 08/05/16 0255  AST 28 21  ALT 52 44  ALKPHOS 64 100  BILITOT 0.8 0.4  PROT 6.4* 5.5*  ALBUMIN 3.1* 2.4*   No results for input(s): LIPASE, AMYLASE in the last 168 hours. No results for input(s): AMMONIA in the last 168 hours. Coagulation Profile:  Recent Labs Lab 08/03/16 1716  INR 1.08   Cardiac Enzymes:  Recent Labs Lab 08/05/16 1638 08/05/16 2053 08/06/16 0252  TROPONINI 0.08* 0.10* 0.05*   BNP (last 3 results) No results for input(s): PROBNP in the last 8760 hours. HbA1C: No results for input(s): HGBA1C in the last 72 hours. CBG:  Recent Labs Lab 08/06/16 0731 08/06/16 1220 08/06/16 1615 08/06/16 2102 08/07/16 0811  GLUCAP 75 222* 128* 307* 145*   Lipid Profile:  Recent Labs  08/05/16 0255  CHOL 171  HDL 24*  LDLCALC 128*  TRIG 97  CHOLHDL 7.1   Thyroid Function Tests: No results for input(s): TSH, T4TOTAL, FREET4, T3FREE, THYROIDAB in the last 72 hours. Anemia Panel: No results for input(s): VITAMINB12, FOLATE, FERRITIN, TIBC, IRON, RETICCTPCT in the last 72 hours. Sepsis Labs:  Recent Labs Lab 08/03/16 0934 08/03/16 1230 08/03/16 1231 08/03/16 1503 08/04/16 0848  PROCALCITON  --  0.30  --   --   --   LATICACIDVEN 2.82*  --  2.0* 1.5 1.7    Recent Results (from the past 240 hour(s))  Culture, blood (Routine x 2)     Status: None (Preliminary result)   Collection Time: 08/03/16  9:06 AM  Result Value Ref Range Status   Specimen Description BLOOD LEFT ANTECUBITAL  Final   Special  Requests   Final    BOTTLES DRAWN AEROBIC AND ANAEROBIC Blood Culture adequate volume   Culture NO GROWTH 3 DAYS  Final   Report Status PENDING  Incomplete  Urine culture     Status: None   Collection Time: 08/03/16  9:37 AM  Result Value Ref Range Status   Specimen Description URINE, RANDOM  Final   Special Requests NONE  Final   Culture NO GROWTH  Final   Report Status 08/04/2016 FINAL  Final  Culture, blood (Routine x 2)     Status: None (Preliminary result)   Collection Time: 08/03/16 10:00 AM  Result Value Ref Range Status   Specimen Description BLOOD RIGHT ANTECUBITAL  Final   Special Requests   Final    BOTTLES DRAWN AEROBIC AND ANAEROBIC Blood  Culture adequate volume   Culture NO GROWTH 3 DAYS  Final   Report Status PENDING  Incomplete  MRSA PCR Screening     Status: None   Collection Time: 08/03/16  5:24 PM  Result Value Ref Range Status   MRSA by PCR NEGATIVE NEGATIVE Final    Comment:        The GeneXpert MRSA Assay (FDA approved for NASAL specimens only), is one component of a comprehensive MRSA colonization surveillance program. It is not intended to diagnose MRSA infection nor to guide or monitor treatment for MRSA infections.          Radiology Studies: Ct Abdomen Pelvis Wo Contrast  Result Date: 08/05/2016 CLINICAL DATA:  Abdominal pain.  Evaluate for mass. EXAM: CT CHEST, ABDOMEN AND PELVIS WITHOUT CONTRAST TECHNIQUE: Multidetector CT imaging of the chest, abdomen and pelvis was performed following the standard protocol without IV contrast. COMPARISON:  04/30/2014 FINDINGS: CT CHEST FINDINGS Cardiovascular: The heart size appears within normal limits. There is aortic atherosclerosis noted. Calcification within the LAD and RCA coronary artery identified. Mediastinum/Nodes: No enlarged mediastinal or hilar lymph nodes identified. No axillary or supraclavicular adenopathy. Lungs/Pleura: Small to moderate right pleural effusion. Small left effusion is  identified. There is dense airspace consolidation involving the right lower lobe peer mild changes of centrilobular emphysema identified. Musculoskeletal: No aggressive lytic or sclerotic bone lesions identified. CT ABDOMEN PELVIS FINDINGS Hepatobiliary: No focal liver abnormality. The gallbladder is normal. No biliary dilatation. Pancreas: Normal appearance of the pancreas. Spleen: Normal in size without focal abnormality. Adrenals/Urinary Tract: The kidneys are unremarkable. Urinary bladder appears normal. Stomach/Bowel: The stomach is unremarkable. No pathologic dilatation of the large or small bowel loops. The appendix is visualized and appears normal. Vascular/Lymphatic: Aortic atherosclerosis. No aneurysm. No abdominal or pelvic adenopathy. Reproductive: Prostate is unremarkable. Other: No free fluid or fluid collections within the abdomen or pelvis. Musculoskeletal: No acute or significant osseous findings. IMPRESSION: 1. There is dense airspace consolidation in the left lower lobe. In the acute setting findings are compatible with pneumonia. If there are signs or symptoms of infection then followup imaging of the chest following a trial of antibiotic therapy is advised to ensure resolution and to rule out underlying malignancy. If there are no symptoms of pneumonia then further investigation with bronchoscopy may be considered to assess for underlying predisposing lesion. 2. Bilateral pleural effusions.  Right greater than left. 3. Aortic Atherosclerosis (ICD10-I70.0). Multi vessel coronary artery calcifications. Electronically Signed   By: Kerby Moors M.D.   On: 08/05/2016 15:19   Ct Chest Wo Contrast  Result Date: 08/05/2016 CLINICAL DATA:  Abdominal pain.  Evaluate for mass. EXAM: CT CHEST, ABDOMEN AND PELVIS WITHOUT CONTRAST TECHNIQUE: Multidetector CT imaging of the chest, abdomen and pelvis was performed following the standard protocol without IV contrast. COMPARISON:  04/30/2014 FINDINGS: CT  CHEST FINDINGS Cardiovascular: The heart size appears within normal limits. There is aortic atherosclerosis noted. Calcification within the LAD and RCA coronary artery identified. Mediastinum/Nodes: No enlarged mediastinal or hilar lymph nodes identified. No axillary or supraclavicular adenopathy. Lungs/Pleura: Small to moderate right pleural effusion. Small left effusion is identified. There is dense airspace consolidation involving the right lower lobe peer mild changes of centrilobular emphysema identified. Musculoskeletal: No aggressive lytic or sclerotic bone lesions identified. CT ABDOMEN PELVIS FINDINGS Hepatobiliary: No focal liver abnormality. The gallbladder is normal. No biliary dilatation. Pancreas: Normal appearance of the pancreas. Spleen: Normal in size without focal abnormality. Adrenals/Urinary Tract: The kidneys are unremarkable. Urinary  bladder appears normal. Stomach/Bowel: The stomach is unremarkable. No pathologic dilatation of the large or small bowel loops. The appendix is visualized and appears normal. Vascular/Lymphatic: Aortic atherosclerosis. No aneurysm. No abdominal or pelvic adenopathy. Reproductive: Prostate is unremarkable. Other: No free fluid or fluid collections within the abdomen or pelvis. Musculoskeletal: No acute or significant osseous findings. IMPRESSION: 1. There is dense airspace consolidation in the left lower lobe. In the acute setting findings are compatible with pneumonia. If there are signs or symptoms of infection then followup imaging of the chest following a trial of antibiotic therapy is advised to ensure resolution and to rule out underlying malignancy. If there are no symptoms of pneumonia then further investigation with bronchoscopy may be considered to assess for underlying predisposing lesion. 2. Bilateral pleural effusions.  Right greater than left. 3. Aortic Atherosclerosis (ICD10-I70.0). Multi vessel coronary artery calcifications. Electronically Signed    By: Kerby Moors M.D.   On: 08/05/2016 15:19        Scheduled Meds: . amLODipine  10 mg Oral Daily  . aspirin EC  81 mg Oral Daily  . azithromycin  250 mg Oral Daily  . insulin aspart  0-20 Units Subcutaneous TID WC  . insulin aspart  0-5 Units Subcutaneous QHS  . metoprolol tartrate  12.5 mg Oral BID  . mometasone-formoterol  2 puff Inhalation BID  . pantoprazole  40 mg Oral Daily  . potassium chloride SA  20 mEq Oral Daily  . predniSONE  30 mg Oral Q breakfast  . tamsulosin  0.4 mg Oral BID   Continuous Infusions: . cefTRIAXone (ROCEPHIN)  IV 1 g (08/07/16 0955)     LOS: 4 days     Cordelia Poche, MD [facility] 08/07/2016, 11:07 AM Pager: [phone]  If 7PM-7AM, please contact night-coverage [email or web address] Password [email or web address] 08/07/2016, 11:07 AM

## 2016-08-08 DIAGNOSIS — I10 Essential (primary) hypertension: Secondary | ICD-10-CM

## 2016-08-08 DIAGNOSIS — R350 Frequency of micturition: Secondary | ICD-10-CM

## 2016-08-08 DIAGNOSIS — J181 Lobar pneumonia, unspecified organism: Secondary | ICD-10-CM

## 2016-08-08 DIAGNOSIS — K210 Gastro-esophageal reflux disease with esophagitis: Secondary | ICD-10-CM

## 2016-08-08 DIAGNOSIS — N401 Enlarged prostate with lower urinary tract symptoms: Secondary | ICD-10-CM

## 2016-08-08 LAB — OSMOLALITY, URINE: Osmolality, Ur: 450 mOsm/kg (ref 300–900)

## 2016-08-08 LAB — BASIC METABOLIC PANEL
ANION GAP: 9 (ref 5–15)
BUN: 19 mg/dL (ref 6–20)
CALCIUM: 8.1 mg/dL — AB (ref 8.9–10.3)
CO2: 26 mmol/L (ref 22–32)
Chloride: 104 mmol/L (ref 101–111)
Creatinine, Ser: 0.85 mg/dL (ref 0.61–1.24)
GLUCOSE: 132 mg/dL — AB (ref 65–99)
POTASSIUM: 3.7 mmol/L (ref 3.5–5.1)
Sodium: 139 mmol/L (ref 135–145)

## 2016-08-08 LAB — OSMOLALITY: Osmolality: 297 mOsm/kg — ABNORMAL HIGH (ref 275–295)

## 2016-08-08 LAB — GLUCOSE, CAPILLARY
GLUCOSE-CAPILLARY: 118 mg/dL — AB (ref 65–99)
GLUCOSE-CAPILLARY: 351 mg/dL — AB (ref 65–99)

## 2016-08-08 LAB — CULTURE, BLOOD (ROUTINE X 2)
Culture: NO GROWTH
Culture: NO GROWTH
SPECIAL REQUESTS: ADEQUATE
Special Requests: ADEQUATE

## 2016-08-08 MED ORDER — DOXYCYCLINE HYCLATE 50 MG PO CAPS: 50.0000 mg | ORAL_CAPSULE | Freq: Two times a day (BID) | ORAL | 0 refills | Status: DC

## 2016-08-08 MED ORDER — PREDNISONE 10 MG PO TABS: 30.0000 mg | ORAL_TABLET | Freq: Every day | ORAL | 0 refills | Status: DC

## 2016-08-08 MED ORDER — TAMSULOSIN HCL 0.4 MG PO CAPS: 0.8000 mg | ORAL_CAPSULE | Freq: Every evening | ORAL | 2 refills | Status: AC

## 2016-08-08 NOTE — Progress Notes (Signed)
[name] to be D/C'd Home per MD order. Discussed with the patient and all questions fully answered.  Allergies as of 08/08/2016   No Known Allergies     Medication List    STOP taking these medications   ibuprofen 200 MG tablet Commonly known as:  ADVIL,MOTRIN   nitrofurantoin (macrocrystal-monohydrate) 100 MG capsule Commonly known as:  MACROBID     TAKE these medications   albuterol 108 (90 Base) MCG/ACT inhaler Commonly known as:  PROVENTIL HFA Inhale 2 puffs into the lungs 2 (two) times daily.   amLODipine 5 MG tablet Commonly known as:  NORVASC TAKE 1 TABLET (5 MG TOTAL) BY MOUTH DAILY.   amLODipine 10 MG tablet Commonly known as:  NORVASC Take 10 mg by mouth daily.   aspirin 81 MG tablet Take 81 mg by mouth daily.   bisoprolol 10 MG tablet Commonly known as:  ZEBETA Take 0.5 tablets (5 mg total) by mouth daily.   budesonide-formoterol 160-4.5 MCG/ACT inhaler Commonly known as:  SYMBICORT Inhale 2 puffs into the lungs 2 (two) times daily.   doxycycline 50 MG capsule Commonly known as:  VIBRAMYCIN Take 1 capsule (50 mg total) by mouth 2 (two) times daily.   guaiFENesin 600 MG 12 hr tablet Commonly known as:  MUCINEX Take 2 tablets (1,200 mg total) by mouth 2 (two) times daily. What changed:  when to take this  reasons to take this   ipratropium-albuterol 0.5-2.5 (3) MG/3ML Soln Commonly known as:  DUONEB Take 3 mLs by nebulization every 6 (six) hours as needed.   losartan 50 MG tablet Commonly known as:  COZAAR Take 1 tablet (50 mg total) by mouth daily.   metFORMIN 500 MG tablet Commonly known as:  GLUCOPHAGE Take 500 mg by mouth 2 (two) times daily with a meal.   nitroGLYCERIN 0.4 MG SL tablet Commonly known as:  NITROSTAT Place 1 tablet (0.4 mg total) under the tongue every 5 (five) minutes as needed for chest pain.   omeprazole 20 MG capsule Commonly known as:  PRILOSEC Take 20 mg by mouth daily.   potassium chloride SA 20 MEQ  tablet Commonly known as:  KLOR-CON M20 Take 1 tablet (20 mEq total) by mouth daily. Need appointment before anymore refill What changed:  additional instructions   predniSONE 10 MG tablet Commonly known as:  DELTASONE Take 3 tablets (30 mg total) by mouth daily with breakfast.   rosuvastatin 20 MG tablet Commonly known as:  CRESTOR Take 20 mg daily for elevated cholesterol   tamsulosin 0.4 MG Caps capsule Commonly known as:  FLOMAX Take 2 capsules (0.8 mg total) by mouth every evening. What changed:  how much to take   tiotropium 18 MCG inhalation capsule Commonly known as:  SPIRIVA HANDIHALER Place 1 capsule (18 mcg total) into inhaler and inhale daily.   TOVIAZ 4 MG Tb24 tablet Generic drug:  fesoterodine Take 4 mg by mouth every evening.       VVS, Skin clean, dry and intact without evidence of skin break down, no evidence of skin tears noted.  IV catheter discontinued intact. Site without signs and symptoms of complications. Dressing and pressure applied.  An After Visit Summary was printed and given to the patient.  Patient escorted via [place], and D/C home via private auto.  [name]  08/08/2016 2:58 PM

## 2016-08-08 NOTE — Discharge Summary (Signed)
Physician Discharge Summary  [name]:[id number] DOB: June 14, 1945 DOA: 08/03/2016  PCP: [name], MD  Admit date: 08/03/2016 Discharge date: 08/08/2016  Admitted From: (Home) Disposition:  (Home)  Recommendations for Outpatient Follow-up:  1. Follow up with PCP in 1-2 weeks. 2. Urology in 1 week as scheduled.  Home Health:(/NO) Equipment/Devices:None  Discharge Condition: (Stable)  CODE STATUS: (FULL)  Diet recommendation: Heart Healthy   Brief/Interim Summary:  71 y.o. male with history of COPD, tobacco abuse, hypertension, diabetes mellitus, CAD status post MI, chronic hematuria and dysuria. Patient presents with dyspnea and abdominal pain and was found to have pneumonia in addition to COPD exacerbation. He has been treated with antibiotics for his pneumonia and with steroids for COPD exacerbation , Flomax has been increase the dose to 0.8 mg daily with subsequent improvement in his urine output patient has symptoms of overactive bladder for which he will follow-up with urology.  Discharge Diagnoses:   Sepsis Secondary to right lower lobe pneumonia. Resolved  Community acquired pneumonia resolved Will discharge on oral doxycycline to complete a course of treatment as an outpatient.  Acute COPD exacerbation Improved -continue steroids as an outpatient for 5 days.  Acute dysuria Abdominal pain History of BPH. Imaging unrevealing. No UTI. Started on Flomax. Significant diuresis. -Ccontinue Flomax -outpatient follow-up with Urology   Chronic diastolic heart failure Euvolemic.  Essential hypertension Elevated blood pressure -continue amlodipine -continue metoprolol; increase if still uncontrolled  CAD S/p multiple stents -continue Crestor   Hyperlipidemia -continue Crestor    Discharge Instructions  Discharge Instructions    Diet - low sodium heart healthy    Complete by:  As directed    Increase activity slowly    Complete by:  As  directed      Allergies as of 08/08/2016   No Known Allergies     Medication List    STOP taking these medications   ibuprofen 200 MG tablet Commonly known as:  ADVIL,MOTRIN   nitrofurantoin (macrocrystal-monohydrate) 100 MG capsule Commonly known as:  MACROBID     TAKE these medications   albuterol 108 (90 Base) MCG/ACT inhaler Commonly known as:  PROVENTIL HFA Inhale 2 puffs into the lungs 2 (two) times daily.   amLODipine 5 MG tablet Commonly known as:  NORVASC TAKE 1 TABLET (5 MG TOTAL) BY MOUTH DAILY.   amLODipine 10 MG tablet Commonly known as:  NORVASC Take 10 mg by mouth daily.   aspirin 81 MG tablet Take 81 mg by mouth daily.   bisoprolol 10 MG tablet Commonly known as:  ZEBETA Take 0.5 tablets (5 mg total) by mouth daily.   budesonide-formoterol 160-4.5 MCG/ACT inhaler Commonly known as:  SYMBICORT Inhale 2 puffs into the lungs 2 (two) times daily.   doxycycline 50 MG capsule Commonly known as:  VIBRAMYCIN Take 1 capsule (50 mg total) by mouth 2 (two) times daily.   guaiFENesin 600 MG 12 hr tablet Commonly known as:  MUCINEX Take 2 tablets (1,200 mg total) by mouth 2 (two) times daily. What changed:  when to take this  reasons to take this   ipratropium-albuterol 0.5-2.5 (3) MG/3ML Soln Commonly known as:  DUONEB Take 3 mLs by nebulization every 6 (six) hours as needed.   losartan 50 MG tablet Commonly known as:  COZAAR Take 1 tablet (50 mg total) by mouth daily.   metFORMIN 500 MG tablet Commonly known as:  GLUCOPHAGE Take 500 mg by mouth 2 (two) times daily with a meal.   nitroGLYCERIN 0.4 MG  SL tablet Commonly known as:  NITROSTAT Place 1 tablet (0.4 mg total) under the tongue every 5 (five) minutes as needed for chest pain.   omeprazole 20 MG capsule Commonly known as:  PRILOSEC Take 20 mg by mouth daily.   potassium chloride SA 20 MEQ tablet Commonly known as:  KLOR-CON M20 Take 1 tablet (20 mEq total) by mouth daily. Need  appointment before anymore refill What changed:  additional instructions   predniSONE 10 MG tablet Commonly known as:  DELTASONE Take 3 tablets (30 mg total) by mouth daily with breakfast.   rosuvastatin 20 MG tablet Commonly known as:  CRESTOR Take 20 mg daily for elevated cholesterol   tamsulosin 0.4 MG Caps capsule Commonly known as:  FLOMAX Take 2 capsules (0.8 mg total) by mouth every evening. What changed:  how much to take   tiotropium 18 MCG inhalation capsule Commonly known as:  SPIRIVA HANDIHALER Place 1 capsule (18 mcg total) into inhaler and inhale daily.   TOVIAZ 4 MG Tb24 tablet Generic drug:  fesoterodine Take 4 mg by mouth every evening.       No Known Allergies  Consultations:  None      Procedures/Studies: Ct Abdomen Pelvis Wo Contrast  Result Date: 08/05/2016 CLINICAL DATA:  Abdominal pain.  Evaluate for mass. EXAM: CT CHEST, ABDOMEN AND PELVIS WITHOUT CONTRAST TECHNIQUE: Multidetector CT imaging of the chest, abdomen and pelvis was performed following the standard protocol without IV contrast. COMPARISON:  04/30/2014 FINDINGS: CT CHEST FINDINGS Cardiovascular: The heart size appears within normal limits. There is aortic atherosclerosis noted. Calcification within the LAD and RCA coronary artery identified. Mediastinum/Nodes: No enlarged mediastinal or hilar lymph nodes identified. No axillary or supraclavicular adenopathy. Lungs/Pleura: Small to moderate right pleural effusion. Small left effusion is identified. There is dense airspace consolidation involving the right lower lobe peer mild changes of centrilobular emphysema identified. Musculoskeletal: No aggressive lytic or sclerotic bone lesions identified. CT ABDOMEN PELVIS FINDINGS Hepatobiliary: No focal liver abnormality. The gallbladder is normal. No biliary dilatation. Pancreas: Normal appearance of the pancreas. Spleen: Normal in size without focal abnormality. Adrenals/Urinary Tract: The kidneys  are unremarkable. Urinary bladder appears normal. Stomach/Bowel: The stomach is unremarkable. No pathologic dilatation of the large or small bowel loops. The appendix is visualized and appears normal. Vascular/Lymphatic: Aortic atherosclerosis. No aneurysm. No abdominal or pelvic adenopathy. Reproductive: Prostate is unremarkable. Other: No free fluid or fluid collections within the abdomen or pelvis. Musculoskeletal: No acute or significant osseous findings. IMPRESSION: 1. There is dense airspace consolidation in the left lower lobe. In the acute setting findings are compatible with pneumonia. If there are signs or symptoms of infection then followup imaging of the chest following a trial of antibiotic therapy is advised to ensure resolution and to rule out underlying malignancy. If there are no symptoms of pneumonia then further investigation with bronchoscopy may be considered to assess for underlying predisposing lesion. 2. Bilateral pleural effusions.  Right greater than left. 3. Aortic Atherosclerosis (ICD10-I70.0). Multi vessel coronary artery calcifications. Electronically Signed   By: Kerby Moors M.D.   On: 08/05/2016 15:19   Abd 1 View (kub)  Result Date: 08/03/2016 CLINICAL DATA:  Sepsis. EXAM: ABDOMEN - 1 VIEW COMPARISON:  None. FINDINGS: The bowel gas pattern is normal. No radio-opaque calculi or other significant radiographic abnormality are seen. Radio- opaque density over the right iliac crest is likely external to the patient. IMPRESSION: Negative. Electronically Signed   By: Titus Dubin M.D.   On: 08/03/2016  11:43   Ct Chest Wo Contrast  Result Date: 08/05/2016 CLINICAL DATA:  Abdominal pain.  Evaluate for mass. EXAM: CT CHEST, ABDOMEN AND PELVIS WITHOUT CONTRAST TECHNIQUE: Multidetector CT imaging of the chest, abdomen and pelvis was performed following the standard protocol without IV contrast. COMPARISON:  04/30/2014 FINDINGS: CT CHEST FINDINGS Cardiovascular: The heart size  appears within normal limits. There is aortic atherosclerosis noted. Calcification within the LAD and RCA coronary artery identified. Mediastinum/Nodes: No enlarged mediastinal or hilar lymph nodes identified. No axillary or supraclavicular adenopathy. Lungs/Pleura: Small to moderate right pleural effusion. Small left effusion is identified. There is dense airspace consolidation involving the right lower lobe peer mild changes of centrilobular emphysema identified. Musculoskeletal: No aggressive lytic or sclerotic bone lesions identified. CT ABDOMEN PELVIS FINDINGS Hepatobiliary: No focal liver abnormality. The gallbladder is normal. No biliary dilatation. Pancreas: Normal appearance of the pancreas. Spleen: Normal in size without focal abnormality. Adrenals/Urinary Tract: The kidneys are unremarkable. Urinary bladder appears normal. Stomach/Bowel: The stomach is unremarkable. No pathologic dilatation of the large or small bowel loops. The appendix is visualized and appears normal. Vascular/Lymphatic: Aortic atherosclerosis. No aneurysm. No abdominal or pelvic adenopathy. Reproductive: Prostate is unremarkable. Other: No free fluid or fluid collections within the abdomen or pelvis. Musculoskeletal: No acute or significant osseous findings. IMPRESSION: 1. There is dense airspace consolidation in the left lower lobe. In the acute setting findings are compatible with pneumonia. If there are signs or symptoms of infection then followup imaging of the chest following a trial of antibiotic therapy is advised to ensure resolution and to rule out underlying malignancy. If there are no symptoms of pneumonia then further investigation with bronchoscopy may be considered to assess for underlying predisposing lesion. 2. Bilateral pleural effusions.  Right greater than left. 3. Aortic Atherosclerosis (ICD10-I70.0). Multi vessel coronary artery calcifications. Electronically Signed   By: Kerby Moors M.D.   On: 08/05/2016 15:19    Dg Chest Portable 1 View  Result Date: 08/03/2016 CLINICAL DATA:  Acute onset of mid chest pain and shortness of breath. Current history of hypertension and COPD. EXAM: PORTABLE CHEST 1 VIEW COMPARISON:  06/01/2015, 12/23/2014 and earlier, including CTA chest 04/30/2014. FINDINGS: Cardiac silhouette upper normal in size to slightly enlarged for AP portable technique, unchanged. Thoracic aorta mildly tortuous and atherosclerotic, unchanged. Hilar and mediastinal contours otherwise unremarkable. Emphysematous changes in both lungs as noted previously. Confluent airspace opacities involving the right lower lobe and patchy airspace opacities in the superior segment right lower lobe associated with a small right pleural effusion. Lungs otherwise clear. No left pleural effusion. Pulmonary vascularity normal. IMPRESSION: 1. Pneumonia involving the right lower lobe with an associated small right parapneumonic effusion. 2.  Emphysema. (ICD10-J43.9) 3.  Aortic Atherosclerosis (ICD10-170.0) Electronically Signed   By: Evangeline Dakin M.D.   On: 08/03/2016 09:58    (Echo, Carotid, EGD, Colonoscopy, ERCP)    Subjective:   Discharge Exam: Vitals:   08/07/16 2056 08/08/16 0519  BP: 128/60 (!) 153/80  Pulse: 75 85  Resp: 18 18  Temp: 98.1 F (36.7 C) 98.1 F (36.7 C)   Vitals:   08/07/16 2054 08/07/16 2056 08/08/16 0519 08/08/16 0837  BP: 128/60 128/60 (!) 153/80   Pulse: 78 75 85   Resp:  18 18   Temp:  98.1 F (36.7 C) 98.1 F (36.7 C)   TempSrc:  Oral Oral   SpO2:  95% 92% 94%  Weight:      Height:  General: Pt is alert, awake, not in acute distress Cardiovascular: RRR, S1/S2 +, no rubs, no gallops Respiratory: CTA bilaterally, no wheezing, no rhonchi Abdominal: Soft , NT,ND,BS+ . Extremities: no edema, no cyanosis    The results of significant diagnostics from this hospitalization (including imaging, microbiology, ancillary and laboratory) are listed below for reference.      Microbiology: Recent Results (from the past 240 hour(s))  Culture, blood (Routine x 2)     Status: None (Preliminary result)   Collection Time: 08/03/16  9:06 AM  Result Value Ref Range Status   Specimen Description BLOOD LEFT ANTECUBITAL  Final   Special Requests   Final    BOTTLES DRAWN AEROBIC AND ANAEROBIC Blood Culture adequate volume   Culture NO GROWTH 4 DAYS  Final   Report Status PENDING  Incomplete  Urine culture     Status: None   Collection Time: 08/03/16  9:37 AM  Result Value Ref Range Status   Specimen Description URINE, RANDOM  Final   Special Requests NONE  Final   Culture NO GROWTH  Final   Report Status 08/04/2016 FINAL  Final  Culture, blood (Routine x 2)     Status: None (Preliminary result)   Collection Time: 08/03/16 10:00 AM  Result Value Ref Range Status   Specimen Description BLOOD RIGHT ANTECUBITAL  Final   Special Requests   Final    BOTTLES DRAWN AEROBIC AND ANAEROBIC Blood Culture adequate volume   Culture NO GROWTH 4 DAYS  Final   Report Status PENDING  Incomplete  MRSA PCR Screening     Status: None   Collection Time: 08/03/16  5:24 PM  Result Value Ref Range Status   MRSA by PCR NEGATIVE NEGATIVE Final    Comment:        The GeneXpert MRSA Assay (FDA approved for NASAL specimens only), is one component of a comprehensive MRSA colonization surveillance program. It is not intended to diagnose MRSA infection nor to guide or monitor treatment for MRSA infections.      Labs: BNP (last 3 results)  Recent Labs  08/05/16 1638  BNP 734.1*   Basic Metabolic Panel:  Recent Labs Lab 08/03/16 0937 08/04/16 0848 08/05/16 0255 08/06/16 0252 08/07/16 0357 08/08/16 0347  NA 138 137 138  --  139 139  K 4.0 3.2* 3.3*  --  4.1 3.7  CL 104 108 111  --  106 104  CO2 26 21* 18*  --  27 26  GLUCOSE 129* 237* 349*  --  187* 132*  BUN 12 14 20   --  18 19  CREATININE 1.00 0.78 0.95  --  0.85 0.85  CALCIUM 8.5* 7.5* 7.4*  --  8.3* 8.1*   MG  --   --  2.1 2.3 2.1  --    Liver Function Tests:  Recent Labs Lab 08/03/16 0937 08/05/16 0255  AST 28 21  ALT 52 44  ALKPHOS 64 100  BILITOT 0.8 0.4  PROT 6.4* 5.5*  ALBUMIN 3.1* 2.4*   No results for input(s): LIPASE, AMYLASE in the last 168 hours. No results for input(s): AMMONIA in the last 168 hours. CBC:  Recent Labs Lab 08/03/16 0937 08/04/16 0848 08/05/16 0255 08/07/16 0951  WBC 13.6* 20.3* 23.4* 11.9*  NEUTROABS 11.1* 19.0* 22.0*  --   HGB 14.6 13.6 12.7* 14.1  HCT 43.7 38.9* 37.3* 42.6  MCV 88.5 86.8 86.3 87.5  PLT 291 267 282 329   Cardiac Enzymes:  Recent Labs Lab 08/05/16  1638 08/05/16 2053 08/06/16 0252  TROPONINI 0.08* 0.10* 0.05*   BNP: Invalid input(s): POCBNP CBG:  Recent Labs Lab 08/07/16 0811 08/07/16 1206 08/07/16 1656 08/07/16 2054 08/08/16 0819  GLUCAP 145* 144* 287* 85 118*   D-Dimer No results for input(s): DDIMER in the last 72 hours. Hgb A1c No results for input(s): HGBA1C in the last 72 hours. Lipid Profile No results for input(s): CHOL, HDL, LDLCALC, TRIG, CHOLHDL, LDLDIRECT in the last 72 hours. Thyroid function studies No results for input(s): TSH, T4TOTAL, T3FREE, THYROIDAB in the last 72 hours.  Invalid input(s): FREET3 Anemia work up No results for input(s): VITAMINB12, FOLATE, FERRITIN, TIBC, IRON, RETICCTPCT in the last 72 hours. Urinalysis    Component Value Date/Time   COLORURINE YELLOW 08/03/2016 0937   APPEARANCEUR HAZY (A) 08/03/2016 0937   LABSPEC 1.013 08/03/2016 0937   PHURINE 5.0 08/03/2016 0937   GLUCOSEU NEGATIVE 08/03/2016 0937   GLUCOSEU NEGATIVE 03/01/2015 1719   HGBUR LARGE (A) 08/03/2016 0937   BILIRUBINUR NEGATIVE 08/03/2016 0937   KETONESUR NEGATIVE 08/03/2016 0937   PROTEINUR 30 (A) 08/03/2016 0937   UROBILINOGEN 0.2 03/01/2015 1719   NITRITE NEGATIVE 08/03/2016 0937   LEUKOCYTESUR SMALL (A) 08/03/2016 0937   Sepsis Labs Invalid input(s): PROCALCITONIN,  WBC,   LACTICIDVEN Microbiology Recent Results (from the past 240 hour(s))  Culture, blood (Routine x 2)     Status: None (Preliminary result)   Collection Time: 08/03/16  9:06 AM  Result Value Ref Range Status   Specimen Description BLOOD LEFT ANTECUBITAL  Final   Special Requests   Final    BOTTLES DRAWN AEROBIC AND ANAEROBIC Blood Culture adequate volume   Culture NO GROWTH 4 DAYS  Final   Report Status PENDING  Incomplete  Urine culture     Status: None   Collection Time: 08/03/16  9:37 AM  Result Value Ref Range Status   Specimen Description URINE, RANDOM  Final   Special Requests NONE  Final   Culture NO GROWTH  Final   Report Status 08/04/2016 FINAL  Final  Culture, blood (Routine x 2)     Status: None (Preliminary result)   Collection Time: 08/03/16 10:00 AM  Result Value Ref Range Status   Specimen Description BLOOD RIGHT ANTECUBITAL  Final   Special Requests   Final    BOTTLES DRAWN AEROBIC AND ANAEROBIC Blood Culture adequate volume   Culture NO GROWTH 4 DAYS  Final   Report Status PENDING  Incomplete  MRSA PCR Screening     Status: None   Collection Time: 08/03/16  5:24 PM  Result Value Ref Range Status   MRSA by PCR NEGATIVE NEGATIVE Final    Comment:        The GeneXpert MRSA Assay (FDA approved for NASAL specimens only), is one component of a comprehensive MRSA colonization surveillance program. It is not intended to diagnose MRSA infection nor to guide or monitor treatment for MRSA infections.      Time coordinating discharge: Over 30 minutes  SIGNED:   Waldron Session, MD  [facility] 08/08/2016, 12:08 PM   If 7PM-7AM, please contact night-coverage [email or web address] Password [email or web address]

## 2016-08-13 DIAGNOSIS — N401 Enlarged prostate with lower urinary tract symptoms: Secondary | ICD-10-CM | POA: Diagnosis not present

## 2016-08-13 DIAGNOSIS — R351 Nocturia: Secondary | ICD-10-CM | POA: Diagnosis not present

## 2016-08-13 DIAGNOSIS — D414 Neoplasm of uncertain behavior of bladder: Secondary | ICD-10-CM | POA: Diagnosis not present

## 2016-08-13 DIAGNOSIS — R3121 Asymptomatic microscopic hematuria: Secondary | ICD-10-CM | POA: Diagnosis not present

## 2016-08-14 ENCOUNTER — Encounter: Payer: Self-pay | Admitting: Physician Assistant

## 2016-08-14 ENCOUNTER — Telehealth: Payer: Self-pay | Admitting: Cardiology

## 2016-08-14 ENCOUNTER — Other Ambulatory Visit: Payer: Self-pay | Admitting: Urology

## 2016-08-14 ENCOUNTER — Ambulatory Visit (INDEPENDENT_AMBULATORY_CARE_PROVIDER_SITE_OTHER): Payer: Medicare HMO | Admitting: Physician Assistant

## 2016-08-14 VITALS — BP 112/56 | HR 76 | Ht 61.0 in | Wt 148.0 lb

## 2016-08-14 DIAGNOSIS — I10 Essential (primary) hypertension: Secondary | ICD-10-CM | POA: Diagnosis not present

## 2016-08-14 DIAGNOSIS — Z79899 Other long term (current) drug therapy: Secondary | ICD-10-CM

## 2016-08-14 DIAGNOSIS — E785 Hyperlipidemia, unspecified: Secondary | ICD-10-CM | POA: Diagnosis not present

## 2016-08-14 DIAGNOSIS — I2510 Atherosclerotic heart disease of native coronary artery without angina pectoris: Secondary | ICD-10-CM

## 2016-08-14 DIAGNOSIS — J449 Chronic obstructive pulmonary disease, unspecified: Secondary | ICD-10-CM | POA: Diagnosis not present

## 2016-08-14 DIAGNOSIS — Z01810 Encounter for preprocedural cardiovascular examination: Secondary | ICD-10-CM

## 2016-08-14 MED ORDER — ROSUVASTATIN CALCIUM 40 MG PO TABS: 40.0000 mg | ORAL_TABLET | Freq: Every day | ORAL | 1 refills | Status: DC

## 2016-08-14 MED ORDER — ROSUVASTATIN CALCIUM 40 MG PO TABS: ORAL_TABLET | ORAL | 3 refills | Status: DC

## 2016-08-14 NOTE — Patient Instructions (Signed)
Medication Instructions:   INCREASE Crestor to 40mg  DAILY Prescription has been called to your pharmacy.  Labwork:   Cholesterol and liver function tests in 2-3 months. (fasting)  Testing/Procedures:  none  Follow-Up:  6 months with Dr. [name]  If you need a refill on your cardiac medications before your next appointment, please call your pharmacy.

## 2016-08-14 NOTE — Progress Notes (Signed)
[name] interpreter used for this visit. [employer] staff member "Sasser" [place] #[id number]

## 2016-08-14 NOTE — Progress Notes (Signed)
Cardiology Office Note    Date:  08/15/2016   ID:  [name] Mar 03, 1945, MRN [id number]  PCP:  [name], MD  Cardiologist:  Dr. [name]   Chief Complaint  Patient presents with  . Follow-up    Surgical clearance, requested by Urology Dr. [name]   Preoperative clearance requested by Dr. [name] prior to upcoming transurethral resection of bladder tumor  History of Present Illness:  [name] is a 71 y.o. male  PMH of severe COPD, CAD s/p BMS to RCA in 2/14 and BMS to proxRCA & OM 6/14, HTN intol to BB due to wheezing, HLD, and GERD. He has a cardiac cath in 7/15 which showed no significant obstructive CAD. He has significant COPD and follows Dr. Lake Bells as outpatient. He has recurrent COPD exacerbation. He was admitted from 12/15 to 12/19 with COPD exacerbation and chest pain. 2D echo obtained on 12/23/2014 showed EF 55-60%, no RWMA, grade 1 diastolic dysfunction, mildly dilated aortic root. Pulm was also consulted during the admission. He eventually underwent LHC on 12/27/2014 which showed 40% ost LAD, 80% ost D2, 50% mid LCx, patent stent in RCA. Aggressive secondary prevention was recommended. He was admitted again from 5/24-5/28/2017 with COPD exacerbation. His most recent admission was on 7/27-08/08/2016 For pneumonia in addition of COPD exacerbation. He was also treated for sepsis. Echocardiogram obtained on 08/05/2016 showed EF 55-60%, moderate LVH, grade 1 does all dysfunction, trivial pericardial effusion.  He has upcoming TURBT by Dr. [name] on 09/07/2016. According to the patient, the only time he had any chest pain recently was when he presented to the hospital with COPD exacerbation and shortness of breath. Otherwise he hasn't not had any chest discomfort. He does ambulate and has not noticed any exertional symptom as well. He denies any lower extremity edema, orthopnea or PND. Recent lab work obtained on 08/05/2016 showed LDL of 128, HDL 24, cholesterol 171, triglyceride 97.  We went through his medication bottles, we could not find the Crestor bottle, it is very questionable whether or not he is actually taking this medication. He denies any prior history of myalgia associated with statins, he is willing to restart Crestor at a increased dose of 40 mg daily. He will need a fasting lipid panel and LFTs in 6 weeks. He can follow-up in 6 month.  The interview was conducted via [employer] production pl[employer] by [employer] [phone]   Past Medical History:  Diagnosis Date  . Allergic rhinitis   . Asthma   . CAD (coronary artery disease), native coronary artery 6/11, 2/14, 6/14; 7/15   a. thought due to vasospasm . cath - CAD LAD 40-50%, LCX 40%, RCA 25% EF 65%. lexiscan myoview EF 68% w/o evidence of ischemia or infarct b. STEMI s/p BMS-mid RCA c. BMS x 2- prox RCA, PLOM  . COPD (chronic obstructive pulmonary disease) ([place])    former smoker  . Diabetes mellitus without complication ([place])    type 2  . GERD (gastroesophageal reflux disease)   . HTN (hypertension)   . Hyperlipidemia   . Shortness of breath dyspnea   . Tobacco abuse     Past Surgical History:  Procedure Laterality Date  . CARDIAC CATHETERIZATION  08/03/13   non obstructive disease LCX 20%; LAD 20%; stents in prox RCA and mid are patent, mild in stent stenosis POM, 30%, normal EF 55-65%  . CARDIAC CATHETERIZATION N/A 12/27/2014   Procedure: Left Heart Cath and Coronary Angiography;  Surgeon: Jettie Booze, MD;  Location:  [place] INVASIVE CV LAB;  Service: Cardiovascular;  Laterality: N/A;  . CORONARY ANGIOPLASTY WITH STENT PLACEMENT  02/15/12   20% distal LM, 30-40% mid LAD, 50% ostial diag, 50-60% mid LCx, 99% focal mid RCA stenosis s/p BMS; 20% distal RCA disease, 50% prox PLB; LVEF 55-65%  . CORONARY ANGIOPLASTY WITH STENT PLACEMENT  07/01/12   30% prox LAD, 30% mid LAD, 20% mid LCx, 90% prox RCA s/p BMS, patent mid RCA stent, 30-40% distal RCA, 80% PLB s/p BMS; EF 55-65%  . left foot surgery     repair - and  ankle   . LEFT HEART CATHETERIZATION WITH CORONARY ANGIOGRAM N/A 02/15/2012   Procedure: LEFT HEART CATHETERIZATION WITH CORONARY ANGIOGRAM;  Surgeon: Peter M [name], MD;  Location: [facility] CATH LAB;  Service: Cardiovascular;  Laterality: N/A;  . LEFT HEART CATHETERIZATION WITH CORONARY ANGIOGRAM N/A 07/01/2012   Procedure: LEFT HEART CATHETERIZATION WITH CORONARY ANGIOGRAM;  Surgeon: Peter M [name], MD;  Location: [facility] CATH LAB;  Service: Cardiovascular;  Laterality: N/A;  . LEFT HEART CATHETERIZATION WITH CORONARY ANGIOGRAM N/A 08/03/2013   Procedure: LEFT HEART CATHETERIZATION WITH CORONARY ANGIOGRAM;  Surgeon: Peter M [name], MD;  Location: [facility] CATH LAB;  Service: Cardiovascular;  Laterality: N/A;  . PERCUTANEOUS CORONARY STENT INTERVENTION (PCI-S) N/A 02/15/2012   Procedure: PERCUTANEOUS CORONARY STENT INTERVENTION (PCI-S);  Surgeon: Peter M [name], MD;  Location: [facility] CATH LAB;  Service: Cardiovascular;  Laterality: N/A;  . PERCUTANEOUS CORONARY STENT INTERVENTION (PCI-S)  07/01/2012   Procedure: PERCUTANEOUS CORONARY STENT INTERVENTION (PCI-S);  Surgeon: Peter M [name], MD;  Location: [facility] CATH LAB;  Service: Cardiovascular;;    Current Medications: Outpatient Medications Prior to Visit  Medication Sig Dispense Refill  . albuterol (PROVENTIL HFA) 108 (90 BASE) MCG/ACT inhaler Inhale 2 puffs into the lungs 2 (two) times daily. 1 Inhaler 1  . amLODipine (NORVASC) 5 MG tablet TAKE 1 TABLET (5 MG TOTAL) BY MOUTH DAILY. 30 tablet 9  . aspirin 81 MG tablet Take 81 mg by mouth daily.     . bisoprolol (ZEBETA) 10 MG tablet Take 0.5 tablets (5 mg total) by mouth daily. 30 tablet 11  . budesonide-formoterol (SYMBICORT) 160-4.5 MCG/ACT inhaler Inhale 2 puffs into the lungs 2 (two) times daily. 1 Inhaler 1  . doxycycline (VIBRAMYCIN) 50 MG capsule Take 1 capsule (50 mg total) by mouth 2 (two) times daily. 10 capsule 0  . guaiFENesin (MUCINEX) 600 MG 12 hr tablet Take 2 tablets (1,200 mg total) by mouth 2 (two) times  daily. (Patient taking differently: Take 1,200 mg by mouth 2 (two) times daily as needed for to loosen phlegm. ) 20 tablet   . ipratropium-albuterol (DUONEB) 0.5-2.5 (3) MG/3ML SOLN Take 3 mLs by nebulization every 6 (six) hours as needed. 360 mL 1  . losartan (COZAAR) 50 MG tablet Take 1 tablet (50 mg total) by mouth daily. 30 tablet 11  . metFORMIN (GLUCOPHAGE) 500 MG tablet Take 500 mg by mouth 2 (two) times daily with a meal.     . nitroGLYCERIN (NITROSTAT) 0.4 MG SL tablet Place 1 tablet (0.4 mg total) under the tongue every 5 (five) minutes as needed for chest pain. 25 tablet 3  . omeprazole (PRILOSEC) 20 MG capsule Take 20 mg by mouth daily.  10  . potassium chloride SA (KLOR-CON M20) 20 MEQ tablet Take 1 tablet (20 mEq total) by mouth daily. Need appointment before anymore refill 30 tablet 0  . predniSONE (DELTASONE) 10 MG tablet Take 3 tablets (30 mg total) by  mouth daily with breakfast. 5 tablet 0  . tamsulosin (FLOMAX) 0.4 MG CAPS capsule Take 2 capsules (0.8 mg total) by mouth every evening. 30 capsule 2  . tiotropium (SPIRIVA HANDIHALER) 18 MCG inhalation capsule Place 1 capsule (18 mcg total) into inhaler and inhale daily. 30 capsule 1  . TOVIAZ 4 MG TB24 tablet Take 4 mg by mouth every evening.  5  . rosuvastatin (CRESTOR) 20 MG tablet Take 20 mg daily for elevated cholesterol 90 tablet 3  . amLODipine (NORVASC) 10 MG tablet Take 10 mg by mouth daily.  5   No facility-administered medications prior to visit.      Allergies:   Patient has no known allergies.   Social History   Social History  . Marital status: Married    Spouse name: N/A  . Number of children: N/A  . Years of education: N/A   Occupational History  . disability    Social History Main Topics  . Smoking status: Former Smoker    Packs/day: 1.00    Years: 50.00    Types: Cigarettes    Quit date: 01/09/2008  . Smokeless tobacco: Never Used     Comment: rolled own cigarettes since age of 24,   . Alcohol use  No     Comment: occassional   . Drug use: No  . Sexual activity: Not Currently     Comment: pt sts he only smoke 1 cigarette, not every day   Other Topics Concern  . None   Social History Narrative   Married, 4 children.   Disability secondary to lung disease.    No known family history of premature CAD.     Family History:  The patient's family history is not on file.   ROS:   Please see the history of present illness.    ROS All other systems reviewed and are negative.   PHYSICAL EXAM:   VS:  BP (!) 112/56   Pulse 76   Ht 5\' 1"  (1.549 m)   Wt 148 lb (67.1 kg)   BMI 27.96 kg/m    GEN: Well nourished, well developed, in no acute distress  HEENT: normal  Neck: no JVD, carotid bruits, or masses Cardiac: RRR; no murmurs, rubs, or gallops,no edema  Respiratory:  clear to auscultation bilaterally, normal work of breathing GI: soft, nontender, nondistended, + BS MS: no deformity or atrophy  Skin: warm and dry, no rash Neuro:  Alert and Oriented x 3, Strength and sensation are intact Psych: euthymic mood, full affect  Wt Readings from Last 3 Encounters:  08/14/16 148 lb (67.1 kg)  08/05/16 174 lb 9.7 oz (79.2 kg)  06/01/15 140 lb (63.5 kg)      Studies/Labs Reviewed:   EKG:  EKG is not ordered today.  Last EKG 08/03/2016 reviewed, normal sinus rhythm without significant ST-T wave changes, no ischemia.  Recent Labs: 08/05/2016: ALT 44; B Natriuretic Peptide 371.5 08/07/2016: Hemoglobin 14.1; Magnesium 2.1; Platelets 329 08/08/2016: BUN 19; Creatinine, Ser 0.85; Potassium 3.7; Sodium 139   Lipid Panel    Component Value Date/Time   CHOL 171 08/05/2016 0255   TRIG 97 08/05/2016 0255   HDL 24 (L) 08/05/2016 0255   CHOLHDL 7.1 08/05/2016 0255   VLDL 19 08/05/2016 0255   LDLCALC 128 (H) 08/05/2016 0255   LDLDIRECT 148.5 08/22/2012 0955    Additional studies/ records that were reviewed today include:   Cath 12/27/2014 Conclusion    Ost LAD to Prox LAD lesion,  40%  stenosed.  Ost 2nd Diag to 2nd Diag lesion, 80% stenosed.  Mid Cx lesion, 50% stenosed.  The left ventricular systolic function is normal.  Patent stents in the RCA.  Mild to moderate left sided disease.   Continue aggressive secondary prevention.  He will be watched for bleeding at his radial site.  He will then go back to [facility] with further w/u per the internal medicine team.       Echo 08/05/2016 LV EF: 55% -   60%  Study Conclusions  - Left ventricle: The cavity size was normal. Wall thickness was   increased in a pattern of moderate LVH. Systolic function was   normal. The estimated ejection fraction was in the range of 55%   to 60%. Doppler parameters are consistent with abnormal left   ventricular relaxation (grade 1 diastolic dysfunction). - Atrial septum: No defect or patent foramen ovale was identified. - Pericardium, extracardiac: A trivial pericardial effusion was   identified.   ASSESSMENT:    1. Preoperative cardiovascular examination   2. Encounter for long-term (current) use of medications   3. Hyperlipidemia, unspecified hyperlipidemia type   4. Coronary artery disease involving native coronary artery of native heart without angina pectoris   5. Chronic obstructive pulmonary disease, unspecified COPD type ([place])   6. Essential hypertension      PLAN:  In order of problems listed above:  1. Preoperative clearance prior to transurethral resection of bladder tumor: He only had chest pain during COPD exacerbation, otherwise he did not have any chest discomfort. Recent echocardiogram showed a normal LV function. I have discussed the case with Dr. Gwenlyn Found DOD who agrees there is no further need to do any further workup in this case. He is cleared to proceed with surgery. He is a relatively low risk patient for the intended procedure.  2. CAD: No obvious angina  3. Hypertension: Blood pressure stable on current medication  4. Hyperlipidemia:  Recent LDL very high, goal should be less than <70. We went through his medication bottles, turned out he is likely not taking Crestor, we have restarted her Crestor at 40 mg daily. He will need a fasting lipid panel and LFT in 6-8 weeks.    Medication Adjustments/Labs and Tests Ordered: Current medicines are reviewed at length with the patient today.  Concerns regarding medicines are outlined above.  Medication changes, Labs and Tests ordered today are listed in the Patient Instructions below. Patient Instructions  Medication Instructions:   INCREASE Crestor to 40mg  DAILY Prescription has been called to your pharmacy.  Labwork:   Cholesterol and liver function tests in 2-3 months. (fasting)  Testing/Procedures:  none  Follow-Up:  6 months with Dr. [name]  If you need a refill on your cardiac medications before your next appointment, please call your pharmacy.      Hilbert Corrigan, [place]  08/15/2016 10:50 PM    [facility] [address], [place], [place]  [address] Phone: [phone]; Fax: [phone]

## 2016-08-14 NOTE — Telephone Encounter (Signed)
Returned call to patient.Dr.Jordan advised needs appointment to be cleared for upcoming surgery.Appointment scheduled with [name] PA this afternoon at 3:30 pm.

## 2016-08-14 NOTE — Telephone Encounter (Signed)
New message          [facility] Pre-operative Risk Assessment    Request for surgical clearance:  1. What type of surgery is being performed?  Removal of bladder tumor  When is this surgery scheduled? 09-07-16 2. Are there any medications that need to be held prior to surgery and how long? Cardiac clearance  3. Name of physician performing surgery?  Dr [name]  4. What is your office phone and fax number?  Fax [phone]  [place] 08/14/2016, 10:00 AM  _________________________________________________________________   (provider comments below)

## 2016-08-14 NOTE — Telephone Encounter (Signed)
Patient has not been seen in over a year in our office. He will need appointment with APP for surgical clearance.  Mauriah Mcmillen [name] MD, [facility]

## 2016-08-15 ENCOUNTER — Encounter: Payer: Self-pay | Admitting: Physician Assistant

## 2016-08-30 NOTE — Patient Instructions (Addendum)
[name]  08/30/2016   Your procedure is scheduled on: 09-07-16   Report to [facility] Main  Entrance Take [place]  elevators to 3rd floor to  [place] at 9:30 AM.   Call this number if you have problems the morning of surgery [phone]    Remember: ONLY 1 PERSON MAY GO WITH YOU TO SHORT STAY TO GET  READY MORNING OF [place].  Do not eat food or drink liquids :After Midnight.     Take these medicines the morning of surgery with A SIP OF WATER: Amlodopine (Norvasc), and Omeprazole (Prilosec). You may also bring and use any inhaler that you might need.    DO NOT TAKE ANY DIABETIC MEDICATIONS DAY OF YOUR SURGERY                               You may not have any metal on your body including hair pins and              piercings  Do not wear jewelry, make-up, lotions, powders or perfumes, deodorant             Men may shave face and neck.   Do not bring valuables to the hospital. [place].  Contacts, dentures or bridgework may not be worn into surgery.     Patients discharged the day of surgery will not be allowed to drive home.  Name and phone number of your driver: [name] [phone]               Please read over the following fact sheets you were given: _____________________________________________________________________ How to Manage Your Diabetes Before and After Surgery  Why is it important to control my blood sugar before and after surgery? . Improving blood sugar levels before and after surgery helps healing and can limit problems. . A way of improving blood sugar control is eating a healthy diet by: o  Eating less sugar and carbohydrates o  Increasing activity/exercise o  Talking with your doctor about reaching your blood sugar goals . High blood sugars (greater than 180 mg/dL) can raise your risk of infections and slow your recovery, so you will need to focus on controlling  your diabetes during the weeks before surgery. . Make sure that the doctor who takes care of your diabetes knows about your planned surgery including the date and location.  How do I manage my blood sugar before surgery? . Check your blood sugar at least 4 times a day, starting 2 days before surgery, to make sure that the level is not too high or low. o Check your blood sugar the morning of your surgery when you wake up and every 2 hours until you get to the Short Stay unit. . If your blood sugar is less than 70 mg/dL, you will need to treat for low blood sugar: o Do not take insulin. o Treat a low blood sugar (less than 70 mg/dL) with  cup of clear juice (cranberry or apple), 4 glucose tablets, OR glucose gel. o Recheck blood sugar in 15 minutes after treatment (to make sure it is greater than 70 mg/dL). If your blood sugar is not greater than 70 mg/dL on recheck, call [phone] for  further instructions. . Report your blood sugar to the short stay nurse when you get to Short Stay.  . If you are admitted to the hospital after surgery: o Your blood sugar will be checked by the staff and you will probably be given insulin after surgery (instead of oral diabetes medicines) to make sure you have good blood sugar levels. o The goal for blood sugar control after surgery is 80-180 mg/dL.   WHAT DO I DO ABOUT MY DIABETES MEDICATION?  [name] Do not take oral diabetes medicines (pills) the morning of surgery.  . THE DAY BEFORE SURGERY, take usual dose of Metformin         Patient Signature:  Date:   Nurse Signature:  Date:   Reviewed and Endorsed by [facility] Patient Education Committee, August 2015           [facility] - Preparing for Surgery Before surgery, you can play an important role.  Because skin is not sterile, your skin needs to be as free of germs as possible.  You can reduce the number of germs on your skin by washing with CHG (chlorahexidine gluconate) soap before surgery.  CHG  is an antiseptic cleaner which kills germs and bonds with the skin to continue killing germs even after washing. Please DO NOT use if you have an allergy to CHG or antibacterial soaps.  If your skin becomes reddened/irritated stop using the CHG and inform your nurse when you arrive at Short Stay. Do not shave (including legs and underarms) for at least 48 hours prior to the first CHG shower.  You may shave your face/neck. Please follow these instructions carefully:  1.  Shower with CHG Soap the night before surgery and the  morning of Surgery.  2.  If you choose to wash your hair, wash your hair first as usual with your  normal  shampoo.  3.  After you shampoo, rinse your hair and body thoroughly to remove the  shampoo.                           4.  Use CHG as you would any other liquid soap.  You can apply chg directly  to the skin and wash                       Gently with a scrungie or clean washcloth.  5.  Apply the CHG Soap to your body ONLY FROM THE NECK DOWN.   Do not use on face/ open                           Wound or open sores. Avoid contact with eyes, ears mouth and genitals (private parts).                       Wash face,  Genitals (private parts) with your normal soap.             6.  Wash thoroughly, paying special attention to the area where your surgery  will be performed.  7.  Thoroughly rinse your body with warm water from the neck down.  8.  DO NOT shower/wash with your normal soap after using and rinsing off  the CHG Soap.                9.  Pat yourself dry with a clean towel.  10.  Wear clean pajamas.            11.  Place clean sheets on your bed the night of your first shower and do not  sleep with pets. Day of Surgery : Do not apply any lotions/deodorants the morning of surgery.  Please wear clean clothes to the hospital/surgery center.  FAILURE TO FOLLOW THESE INSTRUCTIONS MAY RESULT IN THE CANCELLATION OF YOUR SURGERY PATIENT  SIGNATURE_________________________________  NURSE SIGNATURE__________________________________  ________________________________________________________________________

## 2016-08-30 NOTE — Progress Notes (Signed)
08-14-16 (EPIC) Cardic clearance from [place], [place]  08-05-16 [facility])  CT w/o contrast   08-05-16 (EPIC)  ECHO 55-60% EF, no stenosis,  08-03-16  EKG, CXR, HGA1C 7.2  08-08-16 BMP

## 2016-08-31 ENCOUNTER — Encounter (HOSPITAL_COMMUNITY)
Admission: RE | Admit: 2016-08-31 | Discharge: 2016-08-31 | Disposition: A | Discharge status: Home / Self Care | Payer: Medicare HMO | Source: Ambulatory Visit | Attending: Urology | Admitting: Urology

## 2016-08-31 ENCOUNTER — Encounter (HOSPITAL_COMMUNITY): Payer: Self-pay

## 2016-08-31 DIAGNOSIS — Z01812 Encounter for preprocedural laboratory examination: Secondary | ICD-10-CM | POA: Insufficient documentation

## 2016-08-31 DIAGNOSIS — D494 Neoplasm of unspecified behavior of bladder: Secondary | ICD-10-CM | POA: Insufficient documentation

## 2016-08-31 LAB — CBC
HCT: 45 % (ref 39.0–52.0)
Hemoglobin: 14.7 g/dL (ref 13.0–17.0)
MCH: 29 pg (ref 26.0–34.0)
MCHC: 32.7 g/dL (ref 30.0–36.0)
MCV: 88.8 fL (ref 78.0–100.0)
PLATELETS: 370 10*3/uL (ref 150–400)
RBC: 5.07 MIL/uL (ref 4.22–5.81)
RDW: 13.8 % (ref 11.5–15.5)
WBC: 8.1 10*3/uL (ref 4.0–10.5)

## 2016-08-31 LAB — GLUCOSE, CAPILLARY: GLUCOSE-CAPILLARY: 129 mg/dL — AB (ref 65–99)

## 2016-09-04 DIAGNOSIS — I10 Essential (primary) hypertension: Secondary | ICD-10-CM | POA: Diagnosis not present

## 2016-09-04 DIAGNOSIS — J449 Chronic obstructive pulmonary disease, unspecified: Secondary | ICD-10-CM | POA: Diagnosis not present

## 2016-09-04 DIAGNOSIS — N401 Enlarged prostate with lower urinary tract symptoms: Secondary | ICD-10-CM | POA: Diagnosis not present

## 2016-09-04 DIAGNOSIS — E1165 Type 2 diabetes mellitus with hyperglycemia: Secondary | ICD-10-CM | POA: Diagnosis not present

## 2016-09-04 DIAGNOSIS — I2510 Atherosclerotic heart disease of native coronary artery without angina pectoris: Secondary | ICD-10-CM | POA: Diagnosis not present

## 2016-09-05 ENCOUNTER — Other Ambulatory Visit: Payer: Self-pay | Admitting: Cardiology

## 2016-09-07 ENCOUNTER — Encounter (HOSPITAL_COMMUNITY)
Admission: RE | Disposition: A | Discharge status: Home / Self Care | Payer: Self-pay | Source: Ambulatory Visit | Attending: Urology

## 2016-09-07 ENCOUNTER — Encounter (HOSPITAL_COMMUNITY): Payer: Self-pay | Admitting: *Deleted

## 2016-09-07 ENCOUNTER — Ambulatory Visit (HOSPITAL_COMMUNITY): Payer: Medicare HMO | Admitting: Anesthesiology

## 2016-09-07 ENCOUNTER — Ambulatory Visit (HOSPITAL_COMMUNITY)
Admission: RE | Admit: 2016-09-07 | Discharge: 2016-09-07 | Disposition: A | Discharge status: Home / Self Care | Payer: Medicare HMO | Source: Ambulatory Visit | Attending: Urology | Admitting: Urology

## 2016-09-07 ENCOUNTER — Ambulatory Visit (HOSPITAL_COMMUNITY): Payer: Medicare HMO

## 2016-09-07 DIAGNOSIS — K219 Gastro-esophageal reflux disease without esophagitis: Secondary | ICD-10-CM | POA: Insufficient documentation

## 2016-09-07 DIAGNOSIS — Z87891 Personal history of nicotine dependence: Secondary | ICD-10-CM | POA: Diagnosis not present

## 2016-09-07 DIAGNOSIS — E7800 Pure hypercholesterolemia, unspecified: Secondary | ICD-10-CM | POA: Diagnosis not present

## 2016-09-07 DIAGNOSIS — J449 Chronic obstructive pulmonary disease, unspecified: Secondary | ICD-10-CM | POA: Insufficient documentation

## 2016-09-07 DIAGNOSIS — N400 Enlarged prostate without lower urinary tract symptoms: Secondary | ICD-10-CM | POA: Diagnosis not present

## 2016-09-07 DIAGNOSIS — I10 Essential (primary) hypertension: Secondary | ICD-10-CM | POA: Diagnosis not present

## 2016-09-07 DIAGNOSIS — E785 Hyperlipidemia, unspecified: Secondary | ICD-10-CM | POA: Diagnosis not present

## 2016-09-07 DIAGNOSIS — R300 Dysuria: Secondary | ICD-10-CM | POA: Insufficient documentation

## 2016-09-07 DIAGNOSIS — D494 Neoplasm of unspecified behavior of bladder: Secondary | ICD-10-CM | POA: Diagnosis not present

## 2016-09-07 DIAGNOSIS — R310 Gross hematuria: Secondary | ICD-10-CM | POA: Insufficient documentation

## 2016-09-07 DIAGNOSIS — R3915 Urgency of urination: Secondary | ICD-10-CM | POA: Diagnosis not present

## 2016-09-07 DIAGNOSIS — C673 Malignant neoplasm of anterior wall of bladder: Secondary | ICD-10-CM | POA: Insufficient documentation

## 2016-09-07 DIAGNOSIS — Z7984 Long term (current) use of oral hypoglycemic drugs: Secondary | ICD-10-CM | POA: Diagnosis not present

## 2016-09-07 DIAGNOSIS — E119 Type 2 diabetes mellitus without complications: Secondary | ICD-10-CM | POA: Diagnosis not present

## 2016-09-07 DIAGNOSIS — Z79899 Other long term (current) drug therapy: Secondary | ICD-10-CM | POA: Insufficient documentation

## 2016-09-07 DIAGNOSIS — N401 Enlarged prostate with lower urinary tract symptoms: Secondary | ICD-10-CM | POA: Insufficient documentation

## 2016-09-07 DIAGNOSIS — Z87438 Personal history of other diseases of male genital organs: Secondary | ICD-10-CM

## 2016-09-07 HISTORY — PX: CYSTOSCOPY W/ RETROGRADES: SHX1426

## 2016-09-07 HISTORY — PX: TRANSURETHRAL RESECTION OF BLADDER TUMOR: SHX2575

## 2016-09-07 LAB — GLUCOSE, CAPILLARY
Glucose-Capillary: 115 mg/dL — ABNORMAL HIGH (ref 65–99)
Glucose-Capillary: 118 mg/dL — ABNORMAL HIGH (ref 65–99)

## 2016-09-07 SURGERY — TURBT (TRANSURETHRAL RESECTION OF BLADDER TUMOR)
Anesthesia: General | Site: Ureter

## 2016-09-07 MED ORDER — PROPOFOL 10 MG/ML IV BOLUS
INTRAVENOUS | Status: AC
  Filled 2016-09-07: qty 20

## 2016-09-07 MED ORDER — FENTANYL CITRATE (PF) 100 MCG/2ML IJ SOLN
INTRAMUSCULAR | Status: DC | PRN
  Administered 2016-09-07 (×2): 100 ug via INTRAVENOUS

## 2016-09-07 MED ORDER — FLUORESCEIN SODIUM 10 % IV SOLN
INTRAVENOUS | Status: AC
  Filled 2016-09-07: qty 5

## 2016-09-07 MED ORDER — PROMETHAZINE HCL 25 MG/ML IJ SOLN
6.2500 mg | INTRAMUSCULAR | Status: DC | PRN
  Administered 2016-09-07: 6.25 mg via INTRAVENOUS
  Filled 2016-09-07: qty 1

## 2016-09-07 MED ORDER — HYDROMORPHONE HCL 2 MG/ML IJ SOLN
INTRAMUSCULAR | Status: AC
  Filled 2016-09-07: qty 1

## 2016-09-07 MED ORDER — ONDANSETRON HCL 4 MG/2ML IJ SOLN
INTRAMUSCULAR | Status: DC | PRN
  Administered 2016-09-07: 4 mg via INTRAVENOUS

## 2016-09-07 MED ORDER — LIDOCAINE 2% (20 MG/ML) 5 ML SYRINGE
INTRAMUSCULAR | Status: DC | PRN
  Administered 2016-09-07: 50 mg via INTRAVENOUS

## 2016-09-07 MED ORDER — FENTANYL CITRATE (PF) 100 MCG/2ML IJ SOLN: 25.0000 ug | INTRAMUSCULAR | Status: DC | PRN

## 2016-09-07 MED ORDER — LIDOCAINE HCL 2 % EX GEL
CUTANEOUS | Status: DC | PRN
  Administered 2016-09-07: 1 via URETHRAL

## 2016-09-07 MED ORDER — TRAMADOL HCL 50 MG PO TABS
50.0000 mg | ORAL_TABLET | Freq: Four times a day (QID) | ORAL | Status: DC | PRN
  Administered 2016-09-07: 50 mg via ORAL
  Filled 2016-09-07 (×2): qty 1

## 2016-09-07 MED ORDER — PHENAZOPYRIDINE HCL 200 MG PO TABS
200.0000 mg | ORAL_TABLET | ORAL | Status: AC
  Administered 2016-09-07: 200 mg via ORAL
  Filled 2016-09-07: qty 1

## 2016-09-07 MED ORDER — SUGAMMADEX SODIUM 500 MG/5ML IV SOLN
INTRAVENOUS | Status: AC
  Filled 2016-09-07: qty 5

## 2016-09-07 MED ORDER — SODIUM CHLORIDE 0.9 % IR SOLN
Status: DC | PRN
  Administered 2016-09-07: 24000 mL

## 2016-09-07 MED ORDER — LABETALOL HCL 5 MG/ML IV SOLN
INTRAVENOUS | Status: DC | PRN
  Administered 2016-09-07: 5 mg via INTRAVENOUS

## 2016-09-07 MED ORDER — IOHEXOL 300 MG/ML  SOLN
INTRAMUSCULAR | Status: DC | PRN
  Administered 2016-09-07: 10 mL

## 2016-09-07 MED ORDER — ROCURONIUM BROMIDE 10 MG/ML (PF) SYRINGE
PREFILLED_SYRINGE | INTRAVENOUS | Status: DC | PRN
  Administered 2016-09-07: 10 mg via INTRAVENOUS
  Administered 2016-09-07: 30 mg via INTRAVENOUS

## 2016-09-07 MED ORDER — LACTATED RINGERS IV SOLN
INTRAVENOUS | Status: DC
  Administered 2016-09-07 (×2): via INTRAVENOUS

## 2016-09-07 MED ORDER — FENTANYL CITRATE (PF) 100 MCG/2ML IJ SOLN
INTRAMUSCULAR | Status: AC
  Filled 2016-09-07: qty 2

## 2016-09-07 MED ORDER — ROCURONIUM BROMIDE 50 MG/5ML IV SOSY
PREFILLED_SYRINGE | INTRAVENOUS | Status: AC
  Filled 2016-09-07: qty 5

## 2016-09-07 MED ORDER — BELLADONNA ALKALOIDS-OPIUM 16.2-60 MG RE SUPP
RECTAL | Status: DC | PRN
  Administered 2016-09-07: 1 via RECTAL

## 2016-09-07 MED ORDER — FLUORESCEIN SODIUM 10 % IV SOLN
INTRAVENOUS | Status: DC | PRN
  Administered 2016-09-07: 50 mg via INTRAVENOUS

## 2016-09-07 MED ORDER — SUGAMMADEX SODIUM 200 MG/2ML IV SOLN
INTRAVENOUS | Status: DC | PRN
  Administered 2016-09-07: 130 mg via INTRAVENOUS

## 2016-09-07 MED ORDER — LIDOCAINE HCL 2 % EX GEL
CUTANEOUS | Status: AC
  Filled 2016-09-07: qty 5

## 2016-09-07 MED ORDER — LABETALOL HCL 5 MG/ML IV SOLN
INTRAVENOUS | Status: AC
  Filled 2016-09-07: qty 4

## 2016-09-07 MED ORDER — LIDOCAINE 2% (20 MG/ML) 5 ML SYRINGE
INTRAMUSCULAR | Status: AC
  Filled 2016-09-07: qty 5

## 2016-09-07 MED ORDER — ONDANSETRON HCL 4 MG/2ML IJ SOLN
INTRAMUSCULAR | Status: AC
  Filled 2016-09-07: qty 2

## 2016-09-07 MED ORDER — PHENAZOPYRIDINE HCL 200 MG PO TABS: 200.0000 mg | ORAL_TABLET | Freq: Three times a day (TID) | ORAL | 0 refills | Status: DC | PRN

## 2016-09-07 MED ORDER — HYDROMORPHONE HCL 1 MG/ML IJ SOLN
INTRAMUSCULAR | Status: DC | PRN
  Administered 2016-09-07 (×2): 0.5 mg via INTRAVENOUS

## 2016-09-07 MED ORDER — PROPOFOL 10 MG/ML IV BOLUS
INTRAVENOUS | Status: DC | PRN
  Administered 2016-09-07: 120 mg via INTRAVENOUS

## 2016-09-07 MED ORDER — TRAMADOL HCL 50 MG PO TABS: 50.0000 mg | ORAL_TABLET | Freq: Four times a day (QID) | ORAL | 0 refills | Status: DC | PRN

## 2016-09-07 MED ORDER — BELLADONNA-OPIUM 16.2-30 MG RE SUPP
RECTAL | Status: AC
  Filled 2016-09-07: qty 1

## 2016-09-07 MED ORDER — CEFAZOLIN SODIUM-DEXTROSE 2-4 GM/100ML-% IV SOLN
2.0000 g | INTRAVENOUS | Status: AC
  Administered 2016-09-07: 2 g via INTRAVENOUS
  Filled 2016-09-07: qty 100

## 2016-09-07 SURGICAL SUPPLY — 33 items
BAG URINE DRAINAGE (UROLOGICAL SUPPLIES) ×2 IMPLANT
BAG URO CATCHER STRL LF (MISCELLANEOUS) ×4 IMPLANT
BASKET DAKOTA 1.9FR 11X120 (BASKET) IMPLANT
BASKET ZERO TIP NITINOL 2.4FR (BASKET) IMPLANT
BSKT STON RTRVL ZERO TP 2.4FR (BASKET)
CATH FOLEY 2WAY SLVR 30CC 24FR (CATHETERS) IMPLANT
CATH FOLEY 3WAY 30CC 22FR (CATHETERS) ×2 IMPLANT
CATH URET 5FR 28IN OPEN ENDED (CATHETERS) ×4 IMPLANT
CLOTH BEACON ORANGE TIMEOUT ST (SAFETY) ×4 IMPLANT
COVER FOOTSWITCH UNIV (MISCELLANEOUS) IMPLANT
COVER SURGICAL LIGHT HANDLE (MISCELLANEOUS) ×2 IMPLANT
ELECT REM PT RETURN 15FT ADLT (MISCELLANEOUS) ×4 IMPLANT
EVACUATOR MICROVAS BLADDER (UROLOGICAL SUPPLIES) IMPLANT
GLOVE BIOGEL M 8.0 STRL (GLOVE) ×4 IMPLANT
GLOVE BIOGEL M STRL SZ7.5 (GLOVE) ×4 IMPLANT
GOWN STRL REUS W/ TWL XL LVL3 (GOWN DISPOSABLE) ×2 IMPLANT
GOWN STRL REUS W/TWL XL LVL3 (GOWN DISPOSABLE) ×8 IMPLANT
GUIDEWIRE ANG ZIPWIRE 038X150 (WIRE) IMPLANT
GUIDEWIRE STR DUAL SENSOR (WIRE) ×4 IMPLANT
LOOP CUT BIPOLAR 24F LRG (ELECTROSURGICAL) ×2 IMPLANT
MANIFOLD NEPTUNE II (INSTRUMENTS) ×4 IMPLANT
NDL SAFETY ECLIPSE 18X1.5 (NEEDLE) ×2 IMPLANT
NEEDLE HYPO 18GX1.5 SHARP (NEEDLE)
PACK CYSTO (CUSTOM PROCEDURE TRAY) ×4 IMPLANT
SET ASPIRATION TUBING (TUBING) IMPLANT
SHEATH ACCESS URETERAL 24CM (SHEATH) IMPLANT
SHEATH ACCESS URETERAL 38CM (SHEATH) IMPLANT
SHEATH ACCESS URETERAL 54CM (SHEATH) IMPLANT
SYRINGE IRR TOOMEY STRL 70CC (SYRINGE) ×2 IMPLANT
TUBING CONNECTING 10 (TUBING) ×3 IMPLANT
TUBING CONNECTING 10' (TUBING) ×1
WATER STERILE IRR 3000ML UROMA (IV SOLUTION) ×2 IMPLANT
WIRE COONS/BENSON .038X145CM (WIRE) IMPLANT

## 2016-09-07 NOTE — Discharge Instructions (Signed)

## 2016-09-07 NOTE — H&P (Signed)
Voiding Symptoms  HPI: [name] is a 71 year-old male patient who was referred by Dr. [name], MD who is here today for further eval and management of BPH and lower tract symptoms.  His symptoms began approximately 08/09/2015. He takes flomax, Lisbeth Ply for his lower urinary tract symptoms.   He does not have frequency. He does have frequency and urgency. He has nocturia 3-4 times per night. The patient states his most bothersome symptom(s) are the following: frequency, urgency, dysuria, and hematuria.   He does not have to wait a long time to start his urinary stream. His urinary stream does not start and stop during voiding. He feels that he does empty his bladder. He complains of other associated symptom(s). The symptoms he complains of are cramping. The patient does not have a history of constipation.   Patient states he has not had urinary retention in the past. He does not have a history of urinary infections.   The patient's most recent PSA was 1.2. This was drawn on 04/13/2016. The patient has no family history of prostate cancer.   The patient states that 2 weeks ago he noted gross hematuria. He had associated frequency and urgency with urge incontinence. He has post void bladder spasms. His symptoms of frequency and urgency plus dysuria present for about a year.     ALLERGIES: None   MEDICATIONS: Doxycycline Hyclate 50 mg tablet  Metformin Hcl 500 mg tablet  Omeprazole 20 mg capsule,delayed release  Tamsulosin Hcl 0.4 mg capsule, ext release 24 hr  Toviaz 4 mg tablet, extended release 24 hr  Albuterol Sulfate  Amlodipine Besylate 10 mg tablet tablet  Bisoprolol Fumarate 10 mg tablet  Budesonide  Guaifenesin  Ipratropium-Albuterol  Losartan Potassium 50 mg tablet  Nitroglycerin 0.4 mg tablet, sublingual  Potassium Chloride  Prednisone 10 mg tablet  Rosuvastatin Calcium 20 mg tablet  Spiriva     GU PSH: None     PSH Notes: heart stent   NON-GU PSH: None   GU PMH:  None   NON-GU PMH: Asthma Diabetes Type 2 GERD Hypercholesterolemia Hypertension    FAMILY HISTORY: Death In The Family Father - Father, Mother   SOCIAL HISTORY: Marital Status: Single Preferred Language: [name]; Race: Asian Current Smoking Status: Patient has never smoked.   Tobacco Use Assessment Completed: Used Tobacco in last 30 days? Has never drank.  Drinks 1 caffeinated drink per day.    REVIEW OF SYSTEMS:    GU Review Male:   Patient reports frequent urination, burning/ pain with urination, and get up at night to urinate. Patient denies hard to postpone urination, leakage of urine, stream starts and stops, trouble starting your stream, have to strain to urinate , erection problems, and penile pain.  Gastrointestinal (Upper):   Patient reports indigestion/ heartburn. Patient denies nausea and vomiting.  Gastrointestinal (Lower):   Patient denies diarrhea and constipation.  Constitutional:   Patient reports fever and fatigue. Patient denies night sweats and weight loss.  Skin:   Patient denies skin rash/ lesion and itching.  Eyes:   Patient denies blurred vision and double vision.  Ears/ Nose/ Throat:   Patient denies sore throat and sinus problems.  Hematologic/Lymphatic:   Patient denies swollen glands and easy bruising.  Cardiovascular:   Patient denies leg swelling and chest pains.  Respiratory:   Patient reports shortness of breath. Patient denies cough.  Endocrine:   Patient denies excessive thirst.  Musculoskeletal:   Patient reports back pain and joint pain.  Neurological:   Patient denies headaches and dizziness.  Psychologic:   Patient denies depression and anxiety.   VITAL SIGNS:      08/13/2016 12:59 PM  Weight 150 lb / 68.04 kg  Height 64 in / 162.56 cm  BP 116/69 mmHg  Pulse 71 /min  BMI 25.7 kg/m   MULTI-SYSTEM PHYSICAL EXAMINATION:    Constitutional: Well-nourished. No physical deformities. Normally developed. Good grooming.  Neck: Neck  symmetrical, not swollen. Normal tracheal position.  Respiratory: No labored breathing, no use of accessory muscles. Clear to auscultation  Cardiovascular: Normal temperature, normal extremity pulses, no swelling, no varicosities. Regular rate and rhythm  Lymphatic: No enlargement of neck, axillae, groin.  Skin: No paleness, no jaundice, no cyanosis. No lesion, no ulcer, no rash.  Neurologic / Psychiatric: Oriented to time, oriented to place, oriented to person. No depression, no anxiety, no agitation.  Gastrointestinal: No mass, no tenderness, no rigidity, non obese abdomen.  Eyes: Normal conjunctivae. Normal eyelids.  Ears, Nose, Mouth, and Throat: Left ear no scars, no lesions, no masses. Right ear no scars, no lesions, no masses. Nose no scars, no lesions, no masses. Normal hearing. Normal lips.  Musculoskeletal: Normal gait and station of head and neck.     PAST DATA REVIEWED:  Source Of History:  Patient  Records Review:   Previous Doctor Records, Previous Patient Records   PROCEDURES:         Flexible Cystoscopy - 52000  Risks, benefits, and some of the potential complications of the procedure were discussed at length with the patient including infection, bleeding, voiding discomfort, urinary retention, fever, chills, sepsis, and others. All questions were answered. Informed consent was obtained. Antibiotic prophylaxis was given. Sterile technique and intraurethral analgesia were used.  Meatus:  Normal size. Normal location. Normal condition.  Urethra:  No strictures.  External Sphincter:  Normal.  Verumontanum:  Normal.  Prostate:  Non-obstructing. No hyperplasia.  Bladder Neck:  Non-obstructing.  Ureteral Orifices:  Normal location. Normal size. Normal shape. Effluxed clear urine.  Bladder:  Mild trabeculation. A left lateral wall tumor. Erythematous mucosa. 2 cm tumor. Multifocal tumors. No stones.      The lower urinary tract was carefully examined. The procedure was  well-tolerated and without complications. Antibiotic instructions were given. Instructions were given to call the office immediately for bloody urine, difficulty urinating, urinary retention, painful or frequent urination, fever, chills, nausea, vomiting or other illness. The patient stated that he understood these instructions and would comply with them.         Urinalysis w/Scope Dipstick Dipstick Cont'd Micro  Color: Amber Bilirubin: Neg WBC/hpf: 0 - 5/hpf  Appearance: Cloudy Ketones: Neg RBC/hpf: >60/hpf  Specific Gravity: 1.020 Blood: 3+ Bacteria: Rare (0-9/hpf)  pH: 5.5 Protein: 2+ Cystals: NS (Not Seen)  Glucose: Neg Urobilinogen: 0.2 Casts: NS (Not Seen)    Nitrites: Neg Trichomonas: Not Present    Leukocyte Esterase: Trace Mucous: Present      Epithelial Cells: 0 - 5/hpf      Yeast: NS (Not Seen)      Sperm: Not Present    ASSESSMENT:      ICD-10 Details  1 GU:   BPH w/LUTS - N40.1   2   Bladder tumor/neoplasm - D41.4    PLAN:           Orders Labs Urine Culture          Schedule Return Visit/Planned Activity: ASAP - Schedule Surgery  Document Letter(s):  Created for Patient: Clinical Summary         Notes:   The patient has a left lateral wall broad-based tumor right at the bladder neck. 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 I spoke to the patient about treatment options, and recommended that he be taken to the operating room for transurethral resection of bladder tumor and bilateral retrograde pyelograms. We discussed the procedure as well as the risks and complications associated with it. Patient understands that he may be discharged home with a Foley catheter. He also understands the risk of bladder perforation, bleeding, and infection. The patient will need clearance from his primary care provider prior to proceeding.

## 2016-09-07 NOTE — Anesthesia Postprocedure Evaluation (Signed)
Anesthesia Post Note  Patient: [name]  Procedure(s) Performed: Procedure(s) (LRB): TRANSURETHRAL RESECTION OF BLADDER TUMOR (TURBT) (N/A) CYSTOSCOPY WITH BILATERAL RETROGRADE PYELOGRAM (Bilateral)     Patient location during evaluation: PACU Anesthesia Type: General Level of consciousness: awake and alert Pain management: pain level controlled Vital Signs Assessment: post-procedure vital signs reviewed and stable Respiratory status: spontaneous breathing, nonlabored ventilation, respiratory function stable and patient connected to nasal cannula oxygen Cardiovascular status: blood pressure returned to baseline and stable Postop Assessment: no signs of nausea or vomiting Anesthetic complications: no    Last Vitals:  Vitals:   09/07/16 1345 09/07/16 1353  BP: 125/73 109/66  Pulse: 77 76  Resp: 17 15  Temp: 36.7 C 36.8 C  SpO2: 92% 92%    Last Pain:  Vitals:   09/07/16 1419  TempSrc:   PainSc: 9                  Audie Wieser S

## 2016-09-07 NOTE — Op Note (Signed)
Preoperative diagnosis:  1.  gross hematuria 2. Bladder tumor   Postoperative diagnosis:  1. same   Procedure: 1. Cystoscopy, bilateral retrograde pyelogram with interpretation 2. Transurethral resection of bladder tumor, 3 cm  Surgeon: [name], MD  Anesthesia: General  Complications: None  Intraoperative findings:  #1: A right retrograde pyelogram was performed with 5 mL of Omnipaque contrast. This demonstrated a normal caliber ureter with no filling defects. The collecting system demonstrates sharp calyces with no hydronephrosis or filling defects. #2: Left retrograde pyelogram was performed using 5 mL of Omnipaque contrast. This demonstrating a normal caliber ureter with no filling defects. The left collecting system did show sharp calyces with no hydronephrosis or filling defects. #3: The patient had a large mass emanating from the left anterior lateral bladder wall. There were several smaller satellite lesions around the left trigone. The mass was on a broad base and was not papillary. Part of the mass had a bright yellow hue. #4: The left ureteral orifice was resected, but floor seen was given at the end of the case and the left kidney was noted to be draining well.  EBL: Minimal  Specimens: None  Indication: [name] is a 71 y.o. patient with lower urinary tract symptoms and gross hematuria who was found to have a large bladder tumor during his evaluation..  After reviewing the management options for treatment, he elected to proceed with the above surgical procedure(s). We have discussed the potential benefits and risks of the procedure, side effects of the proposed treatment, the likelihood of the patient achieving the goals of the procedure, and any potential problems that might occur during the procedure or recuperation. Informed consent has been obtained.  Description of procedure:  The patient was taken to the operating room and general anesthesia was induced.   The patient was placed in the dorsal lithotomy position, prepped and draped in the usual sterile fashion, and preoperative antibiotics were administered. A preoperative time-out was performed.   A 21 French 30 cystoscope with Shiley passed to the patient's urethra and into the bladder under visual guidance. A 360 cystoscopic evaluation was performed with the above findings. I then injected 5 mL's of Omnipaque contrast into the right ureteral orifice with a 5 [place] open-ended ureteral catheter with the above findings. I then proceeded to perform a retrograde pyelogram on the patient's left side in a similar fashion with the above findings.  I then removed the 21 French cystoscope for exchange of the 52 [place] resectoscopesheath using the visual obturator. The artery was exchanged for the loop cautery device. The bladder tumor was then systematically removed down to the bladder wall. Thethere are several satellite lesions that were also removed. Hemostasis was noted to be excellent. Given that the left ureteral orifice had been resected over, I asked the nurse anesthetist to give 50 mg of floor seen. After 3 minutes, both ureteral orifice easy or noted to have strong ureteral jets.  A B and O suppository is placed in the patient's rectum. Urethral lidocaine jelly was injected the patient's urethra. A 22 French three-way Foley catheter was inserted into the patient's bladder. The reflux was noted to be clear.  The patient was subsequently extubated and returned to PACU stable condition.  [name], M.D.

## 2016-09-07 NOTE — Progress Notes (Signed)
Patient arrives back from surgery very agitated and wants to get up to bathroom. Requested daughter's presence to help with translation. Up to bathroom and unable to void. Feels rectal and bladder pressure (B and O suppository). Up to recliner chair as he does not want to get back into stretcher.

## 2016-09-07 NOTE — Anesthesia Procedure Notes (Signed)
Procedure Name: Intubation Date/Time: 09/07/2016 11:38 AM Performed by: Anne Fu Pre-anesthesia Checklist: Patient identified, Emergency Drugs available, Suction available, Patient being monitored and Timeout performed Patient Re-evaluated:Patient Re-evaluated prior to induction Oxygen Delivery Method: Circle system utilized Preoxygenation: Pre-oxygenation with 100% oxygen Induction Type: IV induction Ventilation: Mask ventilation without difficulty Laryngoscope Size: Mac and 4 Grade View: Grade I Tube type: Oral Tube size: 7.5 mm Number of attempts: 1 Airway Equipment and Method: Stylet Placement Confirmation: ETT inserted through vocal cords under direct vision,  positive ETCO2 and breath sounds checked- equal and bilateral Secured at: 19 cm Tube secured with: Tape Dental Injury: Teeth and Oropharynx as per pre-operative assessment

## 2016-09-07 NOTE — Anesthesia Preprocedure Evaluation (Signed)
Anesthesia Evaluation  Patient identified by MRN, date of birth, ID band Patient awake    Reviewed: Allergy & Precautions, NPO status , Patient's Chart, lab work & pertinent test results  Airway Mallampati: II  TM Distance: >3 FB Neck ROM: Full    Dental no notable dental hx.    Pulmonary COPD, former smoker,    Pulmonary exam normal breath sounds clear to auscultation       Cardiovascular hypertension, + CAD, + Past MI and + Cardiac Stents  Normal cardiovascular exam Rhythm:Regular Rate:Normal  Left ventricle:  The cavity size was normal. Wall thickness was increased in a pattern of moderate LVH. Systolic function was normal. The estimated ejection fraction was in the range of 55% to 60%. Doppler parameters are consistent with abnormal left ventricular relaxation (grade 1 diastolic dysfunction).  ------------------------------------------------------------------- Aortic valve:   Trileaflet; mildly calcified leaflets.  Doppler: There was no stenosis.  ------------------------------------------------------------------- Aorta:  The aorta was normal, not dilated, and non-diseased.  ------------------------------------------------------------------- Mitral valve:   Structurally normal valve.   Leaflet separation was normal.  Doppler:  Transvalvular velocity was within the normal range. There was no evidence for stenosis. There was no significant regurgitation.   Neuro/Psych negative neurological ROS  negative psych ROS   GI/Hepatic negative GI ROS, Neg liver ROS,   Endo/Other  diabetes  Renal/GU negative Renal ROS  negative genitourinary   Musculoskeletal negative musculoskeletal ROS (+)   Abdominal   Peds negative pediatric ROS (+)  Hematology negative hematology ROS (+)   Anesthesia Other Findings   Reproductive/Obstetrics negative OB ROS                             Anesthesia  Physical Anesthesia Plan  ASA: III  Anesthesia Plan: General   Post-op Pain Management:    Induction: Intravenous  PONV Risk Score and Plan: 1 and Ondansetron and Dexamethasone  Airway Management Planned: LMA  Additional Equipment:   Intra-op Plan:   Post-operative Plan: Extubation in OR  Informed Consent: I have reviewed the patients History and Physical, chart, labs and discussed the procedure including the risks, benefits and alternatives for the proposed anesthesia with the patient or authorized representative who has indicated his/her understanding and acceptance.   Dental advisory given  Plan Discussed with: CRNA and Surgeon  Anesthesia Plan Comments: (ETT if surgeon needs relaxation)        Anesthesia Quick Evaluation

## 2016-09-07 NOTE — Transfer of Care (Signed)
Immediate Anesthesia Transfer of Care Note  Patient: [name]  Procedure(s) Performed: Procedure(s): TRANSURETHRAL RESECTION OF BLADDER TUMOR (TURBT) (N/A) CYSTOSCOPY WITH BILATERAL RETROGRADE PYELOGRAM (Bilateral)  Patient Location: PACU  Anesthesia Type:General  Level of Consciousness:  sedated, patient cooperative and responds to stimulation  Airway & Oxygen Therapy:Patient Spontanous Breathing and Patient connected to face mask oxgen  Post-op Assessment:  Report given to PACU RN and Post -op Vital signs reviewed and stable  Post vital signs:  Reviewed and stable  Last Vitals:  Vitals:   09/07/16 0902  BP: 140/76  Pulse: 94  Resp: 16  Temp: 37.4 C  SpO2: 56%    Complications: No apparent anesthesia complications

## 2016-09-21 DIAGNOSIS — C672 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: Secondary | ICD-10-CM | POA: Diagnosis not present

## 2016-09-21 DIAGNOSIS — R310 Gross hematuria: Secondary | ICD-10-CM | POA: Diagnosis not present

## 2016-09-21 DIAGNOSIS — R351 Nocturia: Secondary | ICD-10-CM | POA: Diagnosis not present

## 2016-09-21 DIAGNOSIS — N401 Enlarged prostate with lower urinary tract symptoms: Secondary | ICD-10-CM | POA: Diagnosis not present

## 2016-10-02 ENCOUNTER — Other Ambulatory Visit: Payer: Self-pay | Admitting: Cardiology

## 2016-10-08 ENCOUNTER — Other Ambulatory Visit: Payer: Self-pay

## 2016-10-08 MED ORDER — OMEPRAZOLE 20 MG PO CPDR: 20.0000 mg | DELAYED_RELEASE_CAPSULE | Freq: Two times a day (BID) | ORAL | 6 refills | Status: DC

## 2016-10-10 ENCOUNTER — Other Ambulatory Visit: Payer: Self-pay | Admitting: Urology

## 2016-10-16 DIAGNOSIS — J209 Acute bronchitis, unspecified: Secondary | ICD-10-CM | POA: Diagnosis not present

## 2016-10-16 DIAGNOSIS — C679 Malignant neoplasm of bladder, unspecified: Secondary | ICD-10-CM | POA: Diagnosis not present

## 2016-10-16 DIAGNOSIS — J449 Chronic obstructive pulmonary disease, unspecified: Secondary | ICD-10-CM | POA: Diagnosis not present

## 2016-10-16 DIAGNOSIS — I10 Essential (primary) hypertension: Secondary | ICD-10-CM | POA: Diagnosis not present

## 2016-10-16 NOTE — Patient Instructions (Addendum)
[name]  10/16/2016   Your procedure is scheduled on: 10-19-16  Report to [facility] Main  Entrance Take [place]  elevators to 3rd floor to  [name] at 530AM.   Call this number if you have problems the morning of surgery [phone]    Remember: ONLY 1 PERSON MAY GO WITH YOU TO SHORT STAY TO GET  READY MORNING OF YOUR SURGERY.  Do not eat food or drink liquids :After Midnight.     Take these medicines the morning of surgery with A SIP OF WATER: inhalers as needed (may bring to hospital), amlodipine(norvasc), rosuvastatin(crestor), tylenol as needed                                You may not have any metal on your body including hair pins and              piercings  Do not wear jewelry, make-up, lotions, powders or perfumes, deodorant                   Men may shave face and neck.   Do not bring valuables to the hospital. [name].  Contacts, dentures or bridgework may not be worn into surgery.      Patients discharged the day of surgery will not be allowed to drive home.  Name and phone number of your driver:  Special Instructions: N/A              Please read over the following fact sheets you were given: _____________________________________________________________________  How to Manage Your Diabetes Before and After Surgery  Why is it important to control my blood sugar before and after surgery? . Improving blood sugar levels before and after surgery helps healing and can limit problems. . A way of improving blood sugar control is eating a healthy diet by: o  Eating less sugar and carbohydrates o  Increasing activity/exercise o  Talking with your doctor about reaching your blood sugar goals . High blood sugars (greater than 180 mg/dL) can raise your risk of infections and slow your recovery, so you will need to focus on controlling your diabetes during the weeks before surgery. . Make  sure that the doctor who takes care of your diabetes knows about your planned surgery including the date and location.  How do I manage my blood sugar before surgery? . Check your blood sugar at least 4 times a day, starting 2 days before surgery, to make sure that the level is not too high or low. o Check your blood sugar the morning of your surgery when you wake up and every 2 hours until you get to the Short Stay unit. . If your blood sugar is less than 70 mg/dL, you will need to treat for low blood sugar: o Do not take insulin. o Treat a low blood sugar (less than 70 mg/dL) with  cup of clear juice (cranberry or apple), 4 glucose tablets, OR glucose gel. o Recheck blood sugar in 15 minutes after treatment (to make sure it is greater than 70 mg/dL). If your blood sugar is not greater than 70 mg/dL on recheck, call [phone] for further instructions. . Report your blood sugar to the short stay  nurse when you get to Short Stay.  . If you are admitted to the hospital after surgery: o Your blood sugar will be checked by the staff and you will probably be given insulin after surgery (instead of oral diabetes medicines) to make sure you have good blood sugar levels. o The goal for blood sugar control after surgery is 80-180 mg/dL.   WHAT DO I DO ABOUT MY DIABETES MEDICATION?   . THE DAY BEFORE SURGERY, take METFORMIN as normal      . THE MORNING OF SURGERY, DO NOT TAKE ANY DIABETIC MEDICATIONS !!!  Patient Signature:  Date:   Nurse Signature:  Date:   Reviewed and Endorsed by [name] Patient Education Committee, August 2015           [name] - Preparing for Surgery Before surgery, you can play an important role.  Because skin is not sterile, your skin needs to be as free of germs as possible.  You can reduce the number of germs on your skin by washing with CHG (chlorahexidine gluconate) soap before surgery.  CHG is an antiseptic cleaner which kills germs and bonds with the  skin to continue killing germs even after washing. Please DO NOT use if you have an allergy to CHG or antibacterial soaps.  If your skin becomes reddened/irritated stop using the CHG and inform your nurse when you arrive at Short Stay. Do not shave (including legs and underarms) for at least 48 hours prior to the first CHG shower.  You may shave your face/neck. Please follow these instructions carefully:  1.  Shower with CHG Soap the night before surgery and the  morning of Surgery.  2.  If you choose to wash your hair, wash your hair first as usual with your  normal  shampoo.  3.  After you shampoo, rinse your hair and body thoroughly to remove the  shampoo.                           4.  Use CHG as you would any other liquid soap.  You can apply chg directly  to the skin and wash                       Gently with a scrungie or clean washcloth.  5.  Apply the CHG Soap to your body ONLY FROM THE NECK DOWN.   Do not use on face/ open                           Wound or open sores. Avoid contact with eyes, ears mouth and genitals (private parts).                       Wash face,  Genitals (private parts) with your normal soap.             6.  Wash thoroughly, paying special attention to the area where your surgery  will be performed.  7.  Thoroughly rinse your body with warm water from the neck down.  8.  DO NOT shower/wash with your normal soap after using and rinsing off  the CHG Soap.                9.  Pat yourself dry with a clean towel.            10.  Wear  clean pajamas.            11.  Place clean sheets on your bed the night of your first shower and do not  sleep with pets. Day of Surgery : Do not apply any lotions/deodorants the morning of surgery.  Please wear clean clothes to the hospital/surgery center.  FAILURE TO FOLLOW THESE INSTRUCTIONS MAY RESULT IN THE CANCELLATION OF YOUR SURGERY PATIENT SIGNATURE_________________________________  NURSE  SIGNATURE__________________________________  ________________________________________________________________________

## 2016-10-16 NOTE — Progress Notes (Signed)
LOV/cardiac clearance [name] PA (mentions discussion of case with Gwenlyn Found MD) 08-14-16 epic   EKG 08-03-16 epic  ECHO 08-05-16 epic  CT [facility] 08-05-16 epic

## 2016-10-17 ENCOUNTER — Encounter (HOSPITAL_COMMUNITY): Payer: Self-pay

## 2016-10-17 ENCOUNTER — Encounter (HOSPITAL_COMMUNITY)
Admission: RE | Admit: 2016-10-17 | Discharge: 2016-10-17 | Disposition: A | Discharge status: Home / Self Care | Payer: Medicare HMO | Source: Ambulatory Visit | Attending: Urology | Admitting: Urology

## 2016-10-17 DIAGNOSIS — R350 Frequency of micturition: Secondary | ICD-10-CM | POA: Diagnosis not present

## 2016-10-17 DIAGNOSIS — Z79899 Other long term (current) drug therapy: Secondary | ICD-10-CM | POA: Diagnosis not present

## 2016-10-17 DIAGNOSIS — Z87891 Personal history of nicotine dependence: Secondary | ICD-10-CM | POA: Diagnosis not present

## 2016-10-17 DIAGNOSIS — E7800 Pure hypercholesterolemia, unspecified: Secondary | ICD-10-CM | POA: Diagnosis not present

## 2016-10-17 DIAGNOSIS — Z7982 Long term (current) use of aspirin: Secondary | ICD-10-CM | POA: Diagnosis not present

## 2016-10-17 DIAGNOSIS — I252 Old myocardial infarction: Secondary | ICD-10-CM | POA: Diagnosis not present

## 2016-10-17 DIAGNOSIS — C672 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: Secondary | ICD-10-CM | POA: Diagnosis not present

## 2016-10-17 DIAGNOSIS — Z7984 Long term (current) use of oral hypoglycemic drugs: Secondary | ICD-10-CM | POA: Diagnosis not present

## 2016-10-17 DIAGNOSIS — J449 Chronic obstructive pulmonary disease, unspecified: Secondary | ICD-10-CM | POA: Diagnosis not present

## 2016-10-17 DIAGNOSIS — C679 Malignant neoplasm of bladder, unspecified: Secondary | ICD-10-CM | POA: Diagnosis present

## 2016-10-17 DIAGNOSIS — I10 Essential (primary) hypertension: Secondary | ICD-10-CM | POA: Diagnosis not present

## 2016-10-17 DIAGNOSIS — Z955 Presence of coronary angioplasty implant and graft: Secondary | ICD-10-CM | POA: Diagnosis not present

## 2016-10-17 DIAGNOSIS — I2510 Atherosclerotic heart disease of native coronary artery without angina pectoris: Secondary | ICD-10-CM | POA: Diagnosis not present

## 2016-10-17 DIAGNOSIS — E119 Type 2 diabetes mellitus without complications: Secondary | ICD-10-CM | POA: Diagnosis not present

## 2016-10-17 DIAGNOSIS — N401 Enlarged prostate with lower urinary tract symptoms: Secondary | ICD-10-CM | POA: Diagnosis not present

## 2016-10-17 DIAGNOSIS — J45909 Unspecified asthma, uncomplicated: Secondary | ICD-10-CM | POA: Diagnosis not present

## 2016-10-17 DIAGNOSIS — K219 Gastro-esophageal reflux disease without esophagitis: Secondary | ICD-10-CM | POA: Diagnosis not present

## 2016-10-17 LAB — CBC
HEMATOCRIT: 43 % (ref 39.0–52.0)
HEMOGLOBIN: 14.1 g/dL (ref 13.0–17.0)
MCH: 28.8 pg (ref 26.0–34.0)
MCHC: 32.8 g/dL (ref 30.0–36.0)
MCV: 87.8 fL (ref 78.0–100.0)
Platelets: 302 10*3/uL (ref 150–400)
RBC: 4.9 MIL/uL (ref 4.22–5.81)
RDW: 14.8 % (ref 11.5–15.5)
WBC: 5.9 10*3/uL (ref 4.0–10.5)

## 2016-10-17 LAB — HEMOGLOBIN A1C
HEMOGLOBIN A1C: 6.1 % — AB (ref 4.8–5.6)
MEAN PLASMA GLUCOSE: 128.37 mg/dL

## 2016-10-17 LAB — BASIC METABOLIC PANEL
ANION GAP: 9 (ref 5–15)
BUN: 15 mg/dL (ref 6–20)
CHLORIDE: 109 mmol/L (ref 101–111)
CO2: 26 mmol/L (ref 22–32)
Calcium: 9.5 mg/dL (ref 8.9–10.3)
Creatinine, Ser: 0.92 mg/dL (ref 0.61–1.24)
GFR calc non Af Amer: >60 mL/min (ref >60)
Glucose, Bld: 114 mg/dL — ABNORMAL HIGH (ref 65–99)
POTASSIUM: 5.2 mmol/L — AB (ref 3.5–5.1)
SODIUM: 144 mmol/L (ref 135–145)

## 2016-10-17 LAB — GLUCOSE, CAPILLARY: GLUCOSE-CAPILLARY: 102 mg/dL — AB (ref 65–99)

## 2016-10-17 NOTE — Progress Notes (Signed)
BP elevated at PAT visit today. [name] interpreter assisting with appt today. Pt expresses concern as he states " I had to walk up that big hill on my way here but ive never seen it high like that before" and states "when my blood sugar is good, my blood pressure gets bad and the same opposite ways". Pt denies any acute  cardiac/stroke sx ie blurry vision, chest pain with radiation, severe headache etc. Pt denies increase in salt intake but does verbally express some stress over all the surgery and medicines he has to take and having to now wear glasses and walk with a cane d/t to some numbness in his right leg that "is coming from the back". RN inquired if patient monitors BP at home, patient states " I go to [facility] to check [facility] blood [facility]". RN instructed patient via interpreter to go to ED  Or call 911 if he experiences any of the abovementioned symptoms, also encouraged patient to go to [facility] later today and recheck BP . Patient verbalized understanding .

## 2016-10-18 NOTE — Anesthesia Preprocedure Evaluation (Addendum)
Anesthesia Evaluation  Patient identified by MRN, date of birth, ID band Patient awake    Reviewed: Allergy & Precautions, NPO status , Patient's Chart, lab work & pertinent test results, reviewed documented beta blocker date and time   Airway Mallampati: II  TM Distance: >3 FB Neck ROM: Full    Dental  (+) Edentulous Lower, Edentulous Upper   Pulmonary COPD, former smoker,    Pulmonary exam normal breath sounds clear to auscultation       Cardiovascular hypertension, + CAD, + Past MI and + Cardiac Stents  Normal cardiovascular exam Rhythm:Regular Rate:Normal  TTE 07/2016 - Moderate LVH. Estimated ejection fraction was in the range of 55% to 60%. Grade 1 diastolic dysfunction.  Cath 2016 - Ost LAD to Prox LAD lesion, 40% stenosed. Ost 2nd Diag to 2nd Diag lesion, 80% stenosed. Mid Cx lesion, 50% stenosed. The left ventricular systolic function is normal. Patent stents in the RCA. Mild to moderate left sided disease.   Neuro/Psych negative neurological ROS  negative psych ROS   GI/Hepatic Neg liver ROS, GERD  Medicated and Controlled,  Endo/Other  diabetes, Type 2  Renal/GU negative Renal ROS   Bladder tumor    Musculoskeletal negative musculoskeletal ROS (+)   Abdominal   Peds negative pediatric ROS (+)  Hematology negative hematology ROS (+)   Anesthesia Other Findings   Reproductive/Obstetrics negative OB ROS                            Anesthesia Physical  Anesthesia Plan  ASA: III  Anesthesia Plan: General   Post-op Pain Management:    Induction: Intravenous  PONV Risk Score and Plan: 3 and Ondansetron, Dexamethasone and Treatment may vary due to age or medical condition  Airway Management Planned: LMA  Additional Equipment: None  Intra-op Plan:   Post-operative Plan: Extubation in OR  Informed Consent: I have reviewed the patients History and Physical, chart,  labs and discussed the procedure including the risks, benefits and alternatives for the proposed anesthesia with the patient or authorized representative who has indicated his/her understanding and acceptance.   Dental advisory given  Plan Discussed with: CRNA  Anesthesia Plan Comments: (ETT if surgeon needs relaxation)       Anesthesia Quick Evaluation

## 2016-10-19 ENCOUNTER — Encounter (HOSPITAL_COMMUNITY): Payer: Self-pay

## 2016-10-19 ENCOUNTER — Ambulatory Visit (HOSPITAL_COMMUNITY)
Admission: RE | Admit: 2016-10-19 | Discharge: 2016-10-19 | Disposition: A | Discharge status: Home / Self Care | Payer: Medicare HMO | Source: Ambulatory Visit | Attending: Urology | Admitting: Urology

## 2016-10-19 ENCOUNTER — Encounter (HOSPITAL_COMMUNITY)
Admission: RE | Disposition: A | Discharge status: Home / Self Care | Payer: Self-pay | Source: Ambulatory Visit | Attending: Urology

## 2016-10-19 ENCOUNTER — Ambulatory Visit (HOSPITAL_COMMUNITY): Payer: Medicare HMO | Admitting: Certified Registered Nurse Anesthetist

## 2016-10-19 ENCOUNTER — Ambulatory Visit (HOSPITAL_COMMUNITY): Payer: Medicare HMO

## 2016-10-19 DIAGNOSIS — Z955 Presence of coronary angioplasty implant and graft: Secondary | ICD-10-CM | POA: Insufficient documentation

## 2016-10-19 DIAGNOSIS — I2510 Atherosclerotic heart disease of native coronary artery without angina pectoris: Secondary | ICD-10-CM | POA: Insufficient documentation

## 2016-10-19 DIAGNOSIS — Z79899 Other long term (current) drug therapy: Secondary | ICD-10-CM | POA: Insufficient documentation

## 2016-10-19 DIAGNOSIS — I252 Old myocardial infarction: Secondary | ICD-10-CM | POA: Insufficient documentation

## 2016-10-19 DIAGNOSIS — E119 Type 2 diabetes mellitus without complications: Secondary | ICD-10-CM | POA: Insufficient documentation

## 2016-10-19 DIAGNOSIS — J45909 Unspecified asthma, uncomplicated: Secondary | ICD-10-CM | POA: Diagnosis not present

## 2016-10-19 DIAGNOSIS — I10 Essential (primary) hypertension: Secondary | ICD-10-CM | POA: Insufficient documentation

## 2016-10-19 DIAGNOSIS — Z7984 Long term (current) use of oral hypoglycemic drugs: Secondary | ICD-10-CM | POA: Diagnosis not present

## 2016-10-19 DIAGNOSIS — J449 Chronic obstructive pulmonary disease, unspecified: Secondary | ICD-10-CM | POA: Insufficient documentation

## 2016-10-19 DIAGNOSIS — C672 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: Secondary | ICD-10-CM | POA: Diagnosis not present

## 2016-10-19 DIAGNOSIS — K219 Gastro-esophageal reflux disease without esophagitis: Secondary | ICD-10-CM | POA: Insufficient documentation

## 2016-10-19 DIAGNOSIS — N401 Enlarged prostate with lower urinary tract symptoms: Secondary | ICD-10-CM | POA: Diagnosis not present

## 2016-10-19 DIAGNOSIS — Z87891 Personal history of nicotine dependence: Secondary | ICD-10-CM | POA: Insufficient documentation

## 2016-10-19 DIAGNOSIS — R350 Frequency of micturition: Secondary | ICD-10-CM | POA: Insufficient documentation

## 2016-10-19 DIAGNOSIS — Z7982 Long term (current) use of aspirin: Secondary | ICD-10-CM | POA: Diagnosis not present

## 2016-10-19 DIAGNOSIS — E7800 Pure hypercholesterolemia, unspecified: Secondary | ICD-10-CM | POA: Insufficient documentation

## 2016-10-19 HISTORY — PX: TRANSURETHRAL RESECTION OF BLADDER TUMOR: SHX2575

## 2016-10-19 HISTORY — PX: CYSTOSCOPY WITH RETROGRADE PYELOGRAM, URETEROSCOPY AND STENT PLACEMENT: SHX5789

## 2016-10-19 LAB — GLUCOSE, CAPILLARY: Glucose-Capillary: 121 mg/dL — ABNORMAL HIGH (ref 65–99)

## 2016-10-19 SURGERY — TURBT (TRANSURETHRAL RESECTION OF BLADDER TUMOR)
Anesthesia: General

## 2016-10-19 MED ORDER — DEXAMETHASONE SODIUM PHOSPHATE 10 MG/ML IJ SOLN
INTRAMUSCULAR | Status: AC
  Filled 2016-10-19: qty 1

## 2016-10-19 MED ORDER — LACTATED RINGERS IV SOLN
INTRAVENOUS | Status: DC
  Administered 2016-10-19 (×2): via INTRAVENOUS

## 2016-10-19 MED ORDER — EPHEDRINE SULFATE 50 MG/ML IJ SOLN
INTRAMUSCULAR | Status: DC | PRN
  Administered 2016-10-19: 10 mg via INTRAVENOUS

## 2016-10-19 MED ORDER — ROCURONIUM BROMIDE 50 MG/5ML IV SOSY
PREFILLED_SYRINGE | INTRAVENOUS | Status: DC | PRN
  Administered 2016-10-19: 10 mg via INTRAVENOUS
  Administered 2016-10-19: 30 mg via INTRAVENOUS

## 2016-10-19 MED ORDER — ROCURONIUM BROMIDE 50 MG/5ML IV SOSY
PREFILLED_SYRINGE | INTRAVENOUS | Status: AC
  Filled 2016-10-19: qty 5

## 2016-10-19 MED ORDER — FENTANYL CITRATE (PF) 100 MCG/2ML IJ SOLN
25.0000 ug | INTRAMUSCULAR | Status: DC | PRN
  Administered 2016-10-19 (×2): 50 ug via INTRAVENOUS

## 2016-10-19 MED ORDER — FLUORESCEIN SODIUM 10 % IV SOLN
INTRAVENOUS | Status: AC
  Filled 2016-10-19: qty 5

## 2016-10-19 MED ORDER — ONDANSETRON HCL 4 MG/2ML IJ SOLN
INTRAMUSCULAR | Status: DC | PRN
  Administered 2016-10-19: 4 mg via INTRAVENOUS

## 2016-10-19 MED ORDER — LIDOCAINE 2% (20 MG/ML) 5 ML SYRINGE
INTRAMUSCULAR | Status: DC | PRN
  Administered 2016-10-19: 100 mg via INTRAVENOUS

## 2016-10-19 MED ORDER — HYDROMORPHONE HCL-NACL 0.5-0.9 MG/ML-% IV SOSY: 0.5000 mg | PREFILLED_SYRINGE | Freq: Once | INTRAVENOUS | Status: DC

## 2016-10-19 MED ORDER — SODIUM CHLORIDE 0.9 % IR SOLN
Status: DC | PRN
  Administered 2016-10-19: 15000 mL via INTRAVESICAL

## 2016-10-19 MED ORDER — LIDOCAINE HCL 2 % EX GEL
CUTANEOUS | Status: AC
  Filled 2016-10-19: qty 5

## 2016-10-19 MED ORDER — PHENAZOPYRIDINE HCL 200 MG PO TABS: 200.0000 mg | ORAL_TABLET | Freq: Three times a day (TID) | ORAL | 0 refills | Status: DC | PRN

## 2016-10-19 MED ORDER — LIDOCAINE 2% (20 MG/ML) 5 ML SYRINGE
INTRAMUSCULAR | Status: AC
  Filled 2016-10-19: qty 5

## 2016-10-19 MED ORDER — DEXAMETHASONE SODIUM PHOSPHATE 10 MG/ML IJ SOLN
INTRAMUSCULAR | Status: DC | PRN
  Administered 2016-10-19: 10 mg via INTRAVENOUS

## 2016-10-19 MED ORDER — SUGAMMADEX SODIUM 200 MG/2ML IV SOLN
INTRAVENOUS | Status: DC | PRN
  Administered 2016-10-19: 200 mg via INTRAVENOUS

## 2016-10-19 MED ORDER — PROPOFOL 10 MG/ML IV BOLUS
INTRAVENOUS | Status: DC | PRN
  Administered 2016-10-19: 150 mg via INTRAVENOUS

## 2016-10-19 MED ORDER — EPHEDRINE 5 MG/ML INJ
INTRAVENOUS | Status: AC
  Filled 2016-10-19: qty 10

## 2016-10-19 MED ORDER — CIPROFLOXACIN IN D5W 400 MG/200ML IV SOLN
INTRAVENOUS | Status: AC
  Filled 2016-10-19: qty 200

## 2016-10-19 MED ORDER — BELLADONNA-OPIUM 16.2-30 MG RE SUPP: 1.0000 | Freq: Once | RECTAL | Status: DC

## 2016-10-19 MED ORDER — PROPOFOL 10 MG/ML IV BOLUS
INTRAVENOUS | Status: AC
  Filled 2016-10-19: qty 20

## 2016-10-19 MED ORDER — SUCCINYLCHOLINE CHLORIDE 200 MG/10ML IV SOSY
PREFILLED_SYRINGE | INTRAVENOUS | Status: AC
  Filled 2016-10-19: qty 10

## 2016-10-19 MED ORDER — SUCCINYLCHOLINE CHLORIDE 200 MG/10ML IV SOSY
PREFILLED_SYRINGE | INTRAVENOUS | Status: DC | PRN
  Administered 2016-10-19: 80 mg via INTRAVENOUS

## 2016-10-19 MED ORDER — FENTANYL CITRATE (PF) 100 MCG/2ML IJ SOLN
INTRAMUSCULAR | Status: DC
Start: 2016-10-19 — End: 2016-10-19
  Filled 2016-10-19: qty 4

## 2016-10-19 MED ORDER — FENTANYL CITRATE (PF) 100 MCG/2ML IJ SOLN
INTRAMUSCULAR | Status: AC
  Filled 2016-10-19: qty 2

## 2016-10-19 MED ORDER — FENTANYL CITRATE (PF) 100 MCG/2ML IJ SOLN
INTRAMUSCULAR | Status: DC | PRN
  Administered 2016-10-19 (×2): 50 ug via INTRAVENOUS

## 2016-10-19 MED ORDER — CIPROFLOXACIN IN D5W 400 MG/200ML IV SOLN
400.0000 mg | Freq: Once | INTRAVENOUS | Status: AC
  Administered 2016-10-19: 400 mg via INTRAVENOUS

## 2016-10-19 MED ORDER — IOHEXOL 300 MG/ML  SOLN
INTRAMUSCULAR | Status: DC | PRN
  Administered 2016-10-19: 15 mL via URETHRAL

## 2016-10-19 MED ORDER — HYDROMORPHONE HCL 1 MG/ML IJ SOLN
INTRAMUSCULAR | Status: AC
  Administered 2016-10-19: 0.5 mg
  Filled 2016-10-19: qty 1

## 2016-10-19 MED ORDER — TRAMADOL HCL 50 MG PO TABS: 50.0000 mg | ORAL_TABLET | Freq: Four times a day (QID) | ORAL | 0 refills | Status: DC | PRN

## 2016-10-19 MED ORDER — ONDANSETRON HCL 4 MG/2ML IJ SOLN: 4.0000 mg | Freq: Once | INTRAMUSCULAR | Status: DC | PRN

## 2016-10-19 MED ORDER — FENTANYL CITRATE (PF) 250 MCG/5ML IJ SOLN
INTRAMUSCULAR | Status: AC
  Filled 2016-10-19: qty 5

## 2016-10-19 MED ORDER — FLUORESCEIN SODIUM 10 % IV SOLN
INTRAVENOUS | Status: DC | PRN
  Administered 2016-10-19: 50 mg via INTRAVENOUS

## 2016-10-19 MED ORDER — LIDOCAINE HCL 2 % EX GEL
CUTANEOUS | Status: DC | PRN
  Administered 2016-10-19: 1 via URETHRAL

## 2016-10-19 MED ORDER — ONDANSETRON HCL 4 MG/2ML IJ SOLN
INTRAMUSCULAR | Status: AC
  Filled 2016-10-19: qty 2

## 2016-10-19 SURGICAL SUPPLY — 23 items
BAG URINE DRAINAGE (UROLOGICAL SUPPLIES) IMPLANT
BAG URO CATCHER STRL LF (MISCELLANEOUS) ×4 IMPLANT
CATH FOLEY 2WAY SLVR 30CC 24FR (CATHETERS) IMPLANT
CATH URET 5FR 28IN OPEN ENDED (CATHETERS) ×2 IMPLANT
COVER FOOTSWITCH UNIV (MISCELLANEOUS) ×2 IMPLANT
COVER SURGICAL LIGHT HANDLE (MISCELLANEOUS) ×2 IMPLANT
ELECT REM PT RETURN 15FT ADLT (MISCELLANEOUS) ×2 IMPLANT
EVACUATOR MICROVAS BLADDER (UROLOGICAL SUPPLIES) IMPLANT
GLOVE BIOGEL M 8.0 STRL (GLOVE) ×4 IMPLANT
GOWN STRL REUS W/ TWL XL LVL3 (GOWN DISPOSABLE) ×2 IMPLANT
GOWN STRL REUS W/TWL XL LVL3 (GOWN DISPOSABLE) ×4 IMPLANT
GUIDEWIRE STR DUAL SENSOR (WIRE) ×2 IMPLANT
LOOP CUT BIPOLAR 24F LRG (ELECTROSURGICAL) ×2 IMPLANT
MANIFOLD NEPTUNE II (INSTRUMENTS) ×4 IMPLANT
NDL SAFETY ECLIPSE 18X1.5 (NEEDLE) ×2 IMPLANT
NEEDLE HYPO 18GX1.5 SHARP (NEEDLE)
PACK CYSTO (CUSTOM PROCEDURE TRAY) ×4 IMPLANT
SET ASPIRATION TUBING (TUBING) IMPLANT
STENT URET 6FRX24 CONTOUR (STENTS) ×2 IMPLANT
SYRINGE IRR TOOMEY STRL 70CC (SYRINGE) IMPLANT
TUBING CONNECTING 10 (TUBING) ×3 IMPLANT
TUBING CONNECTING 10' (TUBING) ×1
WATER STERILE IRR 3000ML UROMA (IV SOLUTION) ×2 IMPLANT

## 2016-10-19 NOTE — Anesthesia Procedure Notes (Signed)
Procedure Name: Intubation Date/Time: 10/19/2016 7:35 AM Performed by: Maxwell Caul Pre-anesthesia Checklist: Patient identified, Emergency Drugs available, Suction available and Patient being monitored Patient Re-evaluated:Patient Re-evaluated prior to induction Oxygen Delivery Method: Circle system utilized Preoxygenation: Pre-oxygenation with 100% oxygen Induction Type: IV induction Ventilation: Mask ventilation without difficulty Laryngoscope Size: Mac and 4 Grade View: Grade I Tube type: Oral Tube size: 7.5 mm Number of attempts: 1 Airway Equipment and Method: Stylet Placement Confirmation: ETT inserted through vocal cords under direct vision,  positive ETCO2 and breath sounds checked- equal and bilateral Secured at: 21 cm Tube secured with: Tape Dental Injury: Teeth and Oropharynx as per pre-operative assessment

## 2016-10-19 NOTE — Op Note (Signed)
Preoperative diagnosis:  1. 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 same   Procedure: 1. Cystoscopy 2. Left retrograde pyelogram with interpretation 3. Left ureteral stent placement 4. Re-transurethral resection of bladder tumor, 4 cm, left lateral wall  Surgeon: [name], MD  Anesthesia: General  Complications: None  Intraoperative findings:  #1:the patient had necrotic-appearing tumor on the left lateral wall with some small satellite lesions adjacent to the primary tumor. In addition, there was tumor that appeared to be emanating from the previous resected ureteral orifice. I re-resected the ureteral orifice and took it further back and still had some concerns as to whether there was frondular material emanating from the meatus. The remaining aspect of the tumor was cleared and muscle was certainly part of the new specimen. I performed left ureteroscopy confirming that there was no additional tumor and that the left ureteral orifice was clear.  #2:The left retrograde pyelogram was performed with 10 mL of Omnipaque contrast with 5 [place] open-ended ureteral catheter. This demonstrated a slightly dilated and tortuous ureter with blunted calyces. There were no appreciable filling defects.  #3:A 24 cm times 6 French double-J ureteral stent was placed in his left ureter.  EBL: Minimal  Specimens: bladder tumor base of the previously resected high-grade infiltrative left lateral wall bladder tumor  Indication: [name] is a 71 y.o. 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  After reviewing the management options for treatment, he elected to proceed with the above surgical procedure(s). We have discussed the potential benefits and risks of the procedure, side  effects of the proposed treatment, the likelihood of the patient achieving the goals of the procedure, and any potential problems that might occur during the procedure or recuperation. Informed consent has been obtained.  Description of procedure:  The patient was taken to the operating room and general anesthesia was induced.  The patient was placed in the dorsal lithotomy position, prepped and draped in the usual sterile fashion, and preoperative antibiotics were administered. A preoperative time-out was performed.   The patient's urethral meatus was dilated serially starting with 20 [place] up to 30 [place]. I then gently passed the resectoscope, 26 French sheath, with the visual obturator into the patient's urethra under visual guidance. I then exchanged the visual obturator for the resectoscope loop and systematically re-resected the base of the previously resected tumor. I then reinspected the patient's left ureteral orifice which was noted to be bulging and effluxing, but there did appear to be some frondular tissue emanating from at the orifice. As such I re-resected this area, and was still not quite sure that all of it had been removed. At this point I gave the patient 50 mg of fluoroscene and did not see it readily effluxing from her left ureteral orifice. As such, I used a 6/4 [place] semirigid ureteroscope and was able to advance this through the ureteral meatus and up into the left proximal ureter. I noted no significant abnormalities. I then gently pulled back and reinspected the new ureter orifice noting no evidence of cancer or papillary tumor. I then advanced a 0.038 sensor wire through the left ureteral orifice and up into the left renal pelvis under fluoroscopic guidance. Exchanged the wire for a 5 [place] open-ended ureteral catheter performed retrograde pyelogram with the above  findings.  I then exchanged the open-ended of the wire again and using the Seldinger technique advanced a 6 cm  times 24 French double-J ureteral stent over the wire and into the left renal pelvis under fluoroscopic guidance. Stent tether was removed prior to placing the stent. Adequate position was noted under fluoroscopic guidance. The bladder was subsequently emptied and urethral lidocaine jelly was instilled patient's urethra. He was subsequently extubated and returned to PACU in stable condition.  [name], M.D.

## 2016-10-19 NOTE — Discharge Instructions (Signed)
General Anesthesia, Adult, Care After These instructions provide you with information about caring for yourself after your procedure. Your health care provider may also give you more specific instructions. Your treatment has been planned according to current medical practices, but problems sometimes occur. Call your health care provider if you have any problems or questions after your procedure. What can I expect after the procedure? After the procedure, it is common to have:  Vomiting.  A sore throat.  Mental slowness.  It is common to feel:  Nauseous.  Cold or shivery.  Sleepy.  Tired.  Sore or achy, even in parts of your body where you did not have surgery.  Follow these instructions at home: For at least 24 hours after the procedure:  Do not: ? Participate in activities where you could fall or become injured. ? Drive. ? Use heavy machinery. ? Drink alcohol. ? Take sleeping pills or medicines that cause drowsiness. ? Make important decisions or sign legal documents. ? Take care of children on your own.  Rest. Eating and drinking  If you vomit, drink water, juice, or soup when you can drink without vomiting.  Drink enough fluid to keep your urine clear or pale yellow.  Make sure you have little or no nausea before eating solid foods.  Follow the diet recommended by your health care provider. General instructions  Have a responsible adult stay with you until you are awake and alert.  Return to your normal activities as told by your health care provider. Ask your health care provider what activities are safe for you.  Take over-the-counter and prescription medicines only as told by your health care provider.  If you smoke, do not smoke without supervision.  Keep all follow-up visits as told by your health care provider. This is important. Contact a health care provider if:  You continue to have nausea or vomiting at home, and medicines are not  helpful.  You cannot drink fluids or start eating again.  You cannot urinate after 8-12 hours.  You develop a skin rash.  You have fever.  You have increasing redness at the site of your procedure. Get help right away if:  You have difficulty breathing.  You have chest pain.  You have unexpected bleeding.  You feel that you are having a life-threatening or urgent problem. This information is not intended to replace advice given to you by your health care provider. Make sure you discuss any questions you have with your health care provider. Document Released: 04/02/2000 Document Revised: 05/30/2015 Document Reviewed: 12/09/2014 Elsevier Interactive Patient Education  2018 [employer].   Transurethral Resection of Bladder Tumor (TURBT) or Bladder Biopsy  Definition:  Transurethral Resection of the Bladder Tumor is a surgical procedure used to diagnose and remove tumors within the bladder. TURBT is the most common treatment for early stage bladder cancer.  General instructions: Your recent bladder surgery requires very little post hospital care but some definite precautions.  Despite the fact that no skin incisions were used, the area around the bladder incisions are raw and covered with scabs to promote healing and prevent bleeding. Certain precautions are needed to insure that the scabs are not disturbed over the next 2-4 weeks while the healing proceeds.  Because the raw surface inside your bladder and the irritating effects of urine you may expect frequency of urination and/or urgency (a stronger desire to urinate) and perhaps even getting up at night more often. This will usually resolve or improve slowly over  the healing period. You may see some blood in your urine over the first 6 weeks. Do not be alarmed, even if the urine was clear for a while. Get off your feet and drink lots of fluids until clearing occurs. If you start to pass clots or don't improve call  us.  Diet: You may return to your normal diet immediately. Because of the raw surface of your bladder, alcohol, spicy foods, foods high in acid and drinks with caffeine may cause irritation or frequency and should be used in moderation. To keep your urine flowing freely and avoid constipation, drink plenty of fluids during the day (8-10 glasses). Tip: Avoid cranberry juice because it is very acidic.  Activity: Your physical activity doesn't need to be restricted. However, if you are very active, you may see some blood in the urine. We suggest that you reduce your activity under the circumstances until the bleeding has stopped.  Bowels: It is important to keep your bowels regular during the postoperative period. Straining with bowel movements can cause bleeding. A bowel movement every other day is reasonable. Use a mild laxative if needed, such as milk of magnesia 2-3 tablespoons, or 2 Dulcolax tablets. Call if you continue to have problems. If you had been taking narcotics for pain, before, during or after your surgery, you may be constipated. Take a laxative if necessary.   Medication: You should resume your pre-surgery medications unless told not to. In addition you may be given an antibiotic to prevent or treat infection. Antibiotics are not always necessary. All medication should be taken as prescribed until the bottles are finished unless you are having an unusual reaction to one of the drugs.   Transurethral Resection of Bladder Tumor, Care After Refer to this sheet in the next few weeks. These instructions provide you with information about caring for yourself after your procedure. Your health care provider may also give you more specific instructions. Your treatment has been planned according to current medical practices, but problems sometimes occur. Call your health care provider if you have any problems or questions after your procedure. What can I expect after the procedure? After the  procedure, it is common to have:  A small amount of blood in your urine for up to 2 weeks.  Soreness or mild discomfort from your catheter. After your catheter is removed, you may have mild soreness, especially when urinating.  Pain in your lower abdomen.  Follow these instructions at home:  Medicines  Take over-the-counter and prescription medicines only as told by your health care provider.  Do not drive or operate heavy machinery while taking prescription pain medicine.  Do not drive for 24 hours if you received a sedative.  If you were prescribed antibiotic medicine, take it as told by your health care provider. Do not stop taking the antibiotic even if you start to feel better. Activity  Return to your normal activities as told by your health care provider. Ask your health care provider what activities are safe for you.  Do not lift anything that is heavier than 10 lb (4.5 kg) for as long as told by your health care provider.  Avoid intense physical activity for as long as told by your health care provider.  Walk at least one time every day. This helps to prevent blood clots. You may increase your physical activity gradually as you start to feel better. General instructions  Do not drink alcohol for as long as told by your health care  provider. This is especially important if you are taking prescription pain medicines.  Do not take baths, swim, or use a hot tub until your health care provider approves.  If you have a catheter, follow instructions from your health care provider about caring for your catheter and your drainage bag.  Drink enough fluid to keep your urine clear or pale yellow.  Wear compression stockings as told by your health care provider. These stockings help to prevent blood clots and reduce swelling in your legs.  Keep all follow-up visits as told by your health care provider. This is important. Contact a health care provider if:  You have pain that  gets worse or does not improve with medicine.  You have blood in your urine for more than 2 weeks.  You have cloudy or bad-smelling urine.  You become constipated. Signs of constipation may include having: ? Fewer than three bowel movements in a week. ? Difficulty having a bowel movement. ? Stools that are dry, hard, or larger than normal.  You have a fever. Get help right away if:  You have: ? Severe pain. ? Bright-red blood in your urine. ? Blood clots in your urine. ? A lot of blood in your urine.  Your catheter has been removed and you are not able to urinate.  You have a catheter in place and the catheter is not draining urine. This information is not intended to replace advice given to you by your health care provider. Make sure you discuss any questions you have with your health care provider. Document Released: 12/06/2014 Document Revised: 08/28/2015 Document Reviewed: 09/16/2014 Elsevier Interactive Patient Education  2018 [employer].

## 2016-10-19 NOTE — H&P (Signed)
/u for transitional cell carcinoma  HPI: [name] is a 71 year-old male established patient who is here for surveillance of bladder cancer.  He underwent a TURBT. His last bladder tumor was resected 09/07/2016. He has had the a total of 1 bladder resections. The patient had their first TURBT in 09/07/2016.   Tumor Pathology: HG invasive - muscularis propria not seen.   The patient did not have any post-operative bladder instillations.   He is not having pain in new locations. He has not had blood in his urine recently. He has not recently had unwanted weight loss.   Patient seen today for f/u of his recent TURBT. He has recovered well and is not longer having gross hematuria. He is complaining of urinary frequency. However, no dysuria or pelvic pain.     ALLERGIES: None   MEDICATIONS: Metformin Hcl 500 mg tablet  Metformin Hcl 500 mg tablet  Omeprazole 20 mg capsule,delayed release  Tamsulosin Hcl 0.4 mg capsule, ext release 24 hr  Toviaz 4 mg tablet, extended release 24 hr  Albuterol Sulfate  Amlodipine Besylate 10 mg tablet tablet  Aspirin Ec 81 mg tablet, delayed release  Bisoprolol Fumarate 10 mg tablet  Budesonide  Guaifenesin  Ipratropium-Albuterol  Klor-Con 10  Losartan Potassium 50 mg tablet  Nitroglycerin 0.4 mg tablet, sublingual  Potassium Chloride  Prednisone 10 mg tablet  Rosuvastatin Calcium 20 mg tablet  Spiriva     GU PSH: Cystoscopy - 08/13/2016 Cystoscopy TURBT 2-5 cm - 09/07/2016      PSH Notes: heart stent   NON-GU PSH: None   GU PMH: Bladder tumor/neoplasm - 08/13/2016 BPH w/LUTS - 08/13/2016    NON-GU PMH: Asthma Diabetes Type 2 GERD Hypercholesterolemia Hypertension    FAMILY HISTORY: Death In The Family Father - Father, Mother   SOCIAL HISTORY: Marital Status: Single Preferred Language: [name]; Race: Asian Current Smoking Status: Patient has never smoked.   Tobacco Use Assessment Completed: Used Tobacco in last 30 days? Has never  drank.  Drinks 1 caffeinated drink per day.    REVIEW OF SYSTEMS:    GU Review Male:   Patient reports frequent urination, hard to postpone urination, get up at night to urinate, leakage of urine, and stream starts and stops. Patient denies burning/ pain with urination, trouble starting your stream, have to strain to urinate , erection problems, and penile pain.  Gastrointestinal (Upper):   Patient denies nausea, vomiting, and indigestion/ heartburn.  Gastrointestinal (Lower):   Patient denies diarrhea and constipation.  Constitutional:   Patient reports weight loss and fatigue. Patient denies fever and night sweats.  Skin:   Patient denies skin rash/ lesion and itching.  Eyes:   Patient reports blurred vision. Patient denies double vision.  Ears/ Nose/ Throat:   Patient denies sore throat and sinus problems.  Hematologic/Lymphatic:   Patient denies swollen glands and easy bruising.  Cardiovascular:   Patient reports leg swelling and chest pains.   Respiratory:   Patient reports cough. Patient denies shortness of breath.  Endocrine:   Patient denies excessive thirst.  Musculoskeletal:   Patient reports joint pain. Patient denies back pain.  Neurological:   Patient reports headaches and dizziness.   Psychologic:   Patient reports depression. Patient denies anxiety.   VITAL SIGNS:      09/21/2016 01:39 PM  Weight 145 lb / 65.77 kg  Height 61 in / 154.94 cm  BP 125/73 mmHg  Pulse 90 /min  BMI 27.4 kg/m   PAST DATA REVIEWED:  Source Of History:  Patient  Records Review:   Pathology Reports, Previous Patient Records   PROCEDURES:          Urinalysis w/Scope Dipstick Dipstick Cont'd Micro  Color: Yellow Bilirubin: Neg WBC/hpf: 6 - 10/hpf  Appearance: Clear Ketones: Neg RBC/hpf: 10 - 20/hpf  Specific Gravity: 1.025 Blood: 3+ Bacteria: Rare (0-9/hpf)  pH: 6.0 Protein: Trace Cystals: NS (Not Seen)  Glucose: Neg Urobilinogen: 0.2 Casts: NS (Not Seen)    Nitrites: Neg Trichomonas: Not  Present    Leukocyte Esterase: Trace Mucous: Present      Epithelial Cells: 0 - 5/hpf      Yeast: NS (Not Seen)      Sperm: Not Present    ASSESSMENT:      ICD-10 Details  1 GU:   Bladder Cancer Lateral - C67.2   2   BPH w/LUTS - N40.1               Notes:   high-grade invasive transitional cell carcinoma with necrosis on 09/2016.    PLAN:           Orders Labs Urine Culture          Schedule Return Visit/Planned Activity: 2 Weeks - Schedule Surgery          Document Letter(s):  Created for Patient: Clinical Summary         Notes:   I went over the pathology report with the patient in significant details. I explained to him the high-grade and invasive nature of his cancer. I recommended that we return to the operating room for a repeat resection of his bladder cancer. Once we have done this, hopefully we will be able to manage this locally with BCG. I briefly explainetailis to the patient, but we will go into more detail after his repeat TURBT.

## 2016-10-19 NOTE — Anesthesia Postprocedure Evaluation (Signed)
Anesthesia Post Note  Patient: [name]  Procedure(s) Performed: TRANSURETHRAL RESECTION OF BLADDER TUMOR (TURBT) (N/A ) CYSTOSCOPY WITH RETROGRADE PYELOGRAM, URETEROSCOPY AND STENT PLACEMENT (Left )     Patient location during evaluation: PACU Anesthesia Type: General Level of consciousness: awake and alert Pain management: pain level controlled Vital Signs Assessment: post-procedure vital signs reviewed and stable Respiratory status: spontaneous breathing, nonlabored ventilation, respiratory function stable and patient connected to nasal cannula oxygen Cardiovascular status: blood pressure returned to baseline and stable Postop Assessment: no apparent nausea or vomiting Anesthetic complications: no    Last Vitals:  Vitals:   10/19/16 1000 10/19/16 1007  BP: 139/84   Pulse: 81 81  Resp: 16 13  Temp:    SpO2: 94% 98%    Last Pain:  Vitals:   10/19/16 0940  TempSrc:   PainSc: [place]

## 2016-10-19 NOTE — Progress Notes (Signed)
Patient c/o 7/10 pain however refused additional pain medication, states he "just wants to go home.  Patient had 1 episode of vomiting clear liquid.  Patient states he feels better and wants to go home.  Patient transported to lobby via wheelchair by staff with no issues noted.  Daughter Maudie Mercury drove patient home.

## 2016-10-19 NOTE — Progress Notes (Signed)
Interpreter at bedside.

## 2016-10-19 NOTE — Transfer of Care (Signed)
Immediate Anesthesia Transfer of Care Note  Patient: [name]  Procedure(s) Performed: Procedure(s) with comments: TRANSURETHRAL RESECTION OF BLADDER TUMOR (TURBT) (N/A) - ONLY NEEDS 60 MIN FOR PROCEDURE CYSTOSCOPY WITH RETROGRADE PYELOGRAM, URETEROSCOPY AND STENT PLACEMENT (Left)  Patient Location: PACU  Anesthesia Type:General  Level of Consciousness:  sedated, patient cooperative and responds to stimulation  Airway & Oxygen Therapy:Patient Spontanous Breathing and Patient connected to face mask oxgen  Post-op Assessment:  Report given to PACU RN and Post -op Vital signs reviewed and stable  Post vital signs:  Reviewed and stable  Last Vitals:  Vitals:   10/19/16 0530  BP: (!) 146/82  Pulse: 98  Resp: 18  Temp: 36.9 C  SpO2: 93%    Complications: No apparent anesthesia complications

## 2016-10-20 ENCOUNTER — Encounter (HOSPITAL_COMMUNITY): Payer: Self-pay | Admitting: Urology

## 2016-11-01 DIAGNOSIS — Z23 Encounter for immunization: Secondary | ICD-10-CM | POA: Diagnosis not present

## 2016-11-01 DIAGNOSIS — J449 Chronic obstructive pulmonary disease, unspecified: Secondary | ICD-10-CM | POA: Diagnosis not present

## 2016-11-01 DIAGNOSIS — C679 Malignant neoplasm of bladder, unspecified: Secondary | ICD-10-CM | POA: Diagnosis not present

## 2016-11-01 DIAGNOSIS — E1165 Type 2 diabetes mellitus with hyperglycemia: Secondary | ICD-10-CM | POA: Diagnosis not present

## 2016-11-01 DIAGNOSIS — I10 Essential (primary) hypertension: Secondary | ICD-10-CM | POA: Diagnosis not present

## 2016-11-05 DIAGNOSIS — C678 Malignant neoplasm of overlapping sites of bladder: Secondary | ICD-10-CM | POA: Diagnosis not present

## 2016-11-05 DIAGNOSIS — R310 Gross hematuria: Secondary | ICD-10-CM | POA: Diagnosis not present

## 2016-11-21 DIAGNOSIS — C678 Malignant neoplasm of overlapping sites of bladder: Secondary | ICD-10-CM | POA: Diagnosis not present

## 2016-11-21 DIAGNOSIS — Z5111 Encounter for antineoplastic chemotherapy: Secondary | ICD-10-CM | POA: Diagnosis not present

## 2016-11-28 DIAGNOSIS — C672 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: Secondary | ICD-10-CM | POA: Diagnosis not present

## 2016-11-28 DIAGNOSIS — Z5111 Encounter for antineoplastic chemotherapy: Secondary | ICD-10-CM | POA: Diagnosis not present

## 2016-11-28 DIAGNOSIS — C678 Malignant neoplasm of overlapping sites of bladder: Secondary | ICD-10-CM | POA: Diagnosis not present

## 2016-12-11 DIAGNOSIS — E1165 Type 2 diabetes mellitus with hyperglycemia: Secondary | ICD-10-CM | POA: Diagnosis not present

## 2016-12-11 DIAGNOSIS — J449 Chronic obstructive pulmonary disease, unspecified: Secondary | ICD-10-CM | POA: Diagnosis not present

## 2016-12-11 DIAGNOSIS — I10 Essential (primary) hypertension: Secondary | ICD-10-CM | POA: Diagnosis not present

## 2016-12-11 DIAGNOSIS — C679 Malignant neoplasm of bladder, unspecified: Secondary | ICD-10-CM | POA: Diagnosis not present

## 2016-12-12 DIAGNOSIS — C678 Malignant neoplasm of overlapping sites of bladder: Secondary | ICD-10-CM | POA: Diagnosis not present

## 2016-12-12 DIAGNOSIS — R310 Gross hematuria: Secondary | ICD-10-CM | POA: Diagnosis not present

## 2016-12-19 DIAGNOSIS — C678 Malignant neoplasm of overlapping sites of bladder: Secondary | ICD-10-CM | POA: Diagnosis not present

## 2016-12-19 DIAGNOSIS — R300 Dysuria: Secondary | ICD-10-CM | POA: Diagnosis not present

## 2016-12-26 DIAGNOSIS — R300 Dysuria: Secondary | ICD-10-CM | POA: Diagnosis not present

## 2016-12-27 DIAGNOSIS — C678 Malignant neoplasm of overlapping sites of bladder: Secondary | ICD-10-CM | POA: Diagnosis not present

## 2016-12-27 DIAGNOSIS — N410 Acute prostatitis: Secondary | ICD-10-CM | POA: Diagnosis not present

## 2017-01-14 DIAGNOSIS — N3000 Acute cystitis without hematuria: Secondary | ICD-10-CM | POA: Diagnosis not present

## 2017-01-14 DIAGNOSIS — C678 Malignant neoplasm of overlapping sites of bladder: Secondary | ICD-10-CM | POA: Diagnosis not present

## 2017-01-22 DIAGNOSIS — E1165 Type 2 diabetes mellitus with hyperglycemia: Secondary | ICD-10-CM | POA: Diagnosis not present

## 2017-01-22 DIAGNOSIS — I2510 Atherosclerotic heart disease of native coronary artery without angina pectoris: Secondary | ICD-10-CM | POA: Diagnosis not present

## 2017-01-22 DIAGNOSIS — J449 Chronic obstructive pulmonary disease, unspecified: Secondary | ICD-10-CM | POA: Diagnosis not present

## 2017-01-22 DIAGNOSIS — G933 Postviral fatigue syndrome: Secondary | ICD-10-CM | POA: Diagnosis not present

## 2017-01-22 DIAGNOSIS — I10 Essential (primary) hypertension: Secondary | ICD-10-CM | POA: Diagnosis not present

## 2017-01-22 DIAGNOSIS — N401 Enlarged prostate with lower urinary tract symptoms: Secondary | ICD-10-CM | POA: Diagnosis not present

## 2017-01-28 DIAGNOSIS — C678 Malignant neoplasm of overlapping sites of bladder: Secondary | ICD-10-CM | POA: Diagnosis not present

## 2017-01-28 DIAGNOSIS — N410 Acute prostatitis: Secondary | ICD-10-CM | POA: Diagnosis not present

## 2017-02-04 ENCOUNTER — Other Ambulatory Visit: Payer: Self-pay | Admitting: Physician Assistant

## 2017-02-04 NOTE — Telephone Encounter (Signed)
Please review for refill, Thanks !  

## 2017-02-25 DIAGNOSIS — R310 Gross hematuria: Secondary | ICD-10-CM | POA: Diagnosis not present

## 2017-02-25 DIAGNOSIS — N414 Granulomatous prostatitis: Secondary | ICD-10-CM | POA: Diagnosis not present

## 2017-02-25 DIAGNOSIS — C672 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: Secondary | ICD-10-CM | POA: Diagnosis not present

## 2017-03-04 DIAGNOSIS — R310 Gross hematuria: Secondary | ICD-10-CM | POA: Diagnosis not present

## 2017-03-04 DIAGNOSIS — N281 Cyst of kidney, acquired: Secondary | ICD-10-CM | POA: Diagnosis not present

## 2017-03-11 DIAGNOSIS — N3000 Acute cystitis without hematuria: Secondary | ICD-10-CM | POA: Diagnosis not present

## 2017-03-11 DIAGNOSIS — C678 Malignant neoplasm of overlapping sites of bladder: Secondary | ICD-10-CM | POA: Diagnosis not present

## 2017-03-13 ENCOUNTER — Other Ambulatory Visit: Payer: Self-pay | Admitting: Urology

## 2017-03-14 ENCOUNTER — Encounter (HOSPITAL_BASED_OUTPATIENT_CLINIC_OR_DEPARTMENT_OTHER): Payer: Self-pay | Admitting: *Deleted

## 2017-03-14 ENCOUNTER — Other Ambulatory Visit: Payer: Self-pay

## 2017-03-14 NOTE — Progress Notes (Addendum)
SPOKE W/ PT DAUGHTER, LANG Y, VIA PHONE FOR PRE-OP INTERVIEW.  DAUGHTER WILL BE INTERPRETER DOS SINCE PT SPEAKS [place].  NPO AFTER MN.  ARRIVE AT 0530.  NEEDS ISTAT.  CURRENT EKG IN CHART AND Epic.  DAUGHTER VERBALIZED UNDERSTANDING IF PT TAKES PRILOSEC AND NORVASC NORMALLY IN AM , PT TO TAKE THEM AM DOS, ALSO DO AM INHALERS AND ALSO DO DUONEB NEBULIZER. DAUGHTER DID HAVE PT'S MED. LIST, ASKED THAT PT BRING CURRENT LIST OF ALL  MEDICATION TO PRE-OP.  MEDICATION LIST WILL NEED TO BE COMPLETELY UPDATED IN Epic.

## 2017-03-14 NOTE — Anesthesia Preprocedure Evaluation (Addendum)
Anesthesia Evaluation  Patient identified by MRN, date of birth, ID band Patient awake    Reviewed: Allergy & Precautions, NPO status , Patient's Chart, lab work & pertinent test results  Airway Mallampati: II  TM Distance: >3 FB Neck ROM: Full    Dental no notable dental hx.    Pulmonary COPD, former smoker,    Pulmonary exam normal breath sounds clear to auscultation       Cardiovascular hypertension, + CAD  Normal cardiovascular exam Rhythm:Regular Rate:Normal     Neuro/Psych negative neurological ROS     GI/Hepatic negative GI ROS,   Endo/Other  diabetes, Type 2  Renal/GU      Musculoskeletal   Abdominal   Peds  Hematology   Anesthesia Other Findings   Reproductive/Obstetrics                            Lab Results  Component Value Date   WBC 5.9 10/17/2016   HGB 14.1 10/17/2016   HCT 43.0 10/17/2016   MCV 87.8 10/17/2016   PLT 302 10/17/2016    Anesthesia Physical Anesthesia Plan  ASA: III  Anesthesia Plan: General   Post-op Pain Management:    Induction: Intravenous  PONV Risk Score and Plan: 2 and Treatment may vary due to age or medical condition and Ondansetron  Airway Management Planned: LMA  Additional Equipment:   Intra-op Plan:   Post-operative Plan:   Informed Consent: I have reviewed the patients History and Physical, chart, labs and discussed the procedure including the risks, benefits and alternatives for the proposed anesthesia with the patient or authorized representative who has indicated his/her understanding and acceptance.   Dental advisory given  Plan Discussed with: CRNA  Anesthesia Plan Comments:         Anesthesia Quick Evaluation

## 2017-03-15 ENCOUNTER — Ambulatory Visit (HOSPITAL_BASED_OUTPATIENT_CLINIC_OR_DEPARTMENT_OTHER): Payer: Medicare HMO | Admitting: Anesthesiology

## 2017-03-15 ENCOUNTER — Encounter (HOSPITAL_BASED_OUTPATIENT_CLINIC_OR_DEPARTMENT_OTHER): Payer: Self-pay | Admitting: *Deleted

## 2017-03-15 ENCOUNTER — Encounter (HOSPITAL_COMMUNITY)
Admission: RE | Disposition: A | Discharge status: Home / Self Care | Payer: Self-pay | Source: Ambulatory Visit | Attending: Urology

## 2017-03-15 ENCOUNTER — Observation Stay (HOSPITAL_BASED_OUTPATIENT_CLINIC_OR_DEPARTMENT_OTHER)
Admission: RE | Admit: 2017-03-15 | Discharge: 2017-03-16 | Disposition: A | Discharge status: Home / Self Care | Payer: Medicare HMO | Source: Ambulatory Visit | Attending: Urology | Admitting: Urology

## 2017-03-15 DIAGNOSIS — C678 Malignant neoplasm of overlapping sites of bladder: Secondary | ICD-10-CM | POA: Diagnosis not present

## 2017-03-15 DIAGNOSIS — C675 Malignant neoplasm of bladder neck: Secondary | ICD-10-CM | POA: Diagnosis not present

## 2017-03-15 DIAGNOSIS — Z7982 Long term (current) use of aspirin: Secondary | ICD-10-CM | POA: Diagnosis not present

## 2017-03-15 DIAGNOSIS — C672 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: Secondary | ICD-10-CM | POA: Diagnosis present

## 2017-03-15 DIAGNOSIS — Z87891 Personal history of nicotine dependence: Secondary | ICD-10-CM | POA: Diagnosis not present

## 2017-03-15 DIAGNOSIS — I10 Essential (primary) hypertension: Secondary | ICD-10-CM | POA: Diagnosis not present

## 2017-03-15 DIAGNOSIS — E119 Type 2 diabetes mellitus without complications: Secondary | ICD-10-CM | POA: Insufficient documentation

## 2017-03-15 DIAGNOSIS — J449 Chronic obstructive pulmonary disease, unspecified: Secondary | ICD-10-CM | POA: Diagnosis not present

## 2017-03-15 DIAGNOSIS — K219 Gastro-esophageal reflux disease without esophagitis: Secondary | ICD-10-CM | POA: Diagnosis not present

## 2017-03-15 DIAGNOSIS — E7800 Pure hypercholesterolemia, unspecified: Secondary | ICD-10-CM | POA: Insufficient documentation

## 2017-03-15 DIAGNOSIS — C674 Malignant neoplasm of posterior wall of bladder: Secondary | ICD-10-CM | POA: Diagnosis not present

## 2017-03-15 DIAGNOSIS — Z79899 Other long term (current) drug therapy: Secondary | ICD-10-CM | POA: Diagnosis not present

## 2017-03-15 DIAGNOSIS — C670 Malignant neoplasm of trigone of bladder: Secondary | ICD-10-CM | POA: Diagnosis not present

## 2017-03-15 DIAGNOSIS — I2510 Atherosclerotic heart disease of native coronary artery without angina pectoris: Secondary | ICD-10-CM | POA: Diagnosis not present

## 2017-03-15 DIAGNOSIS — Z7984 Long term (current) use of oral hypoglycemic drugs: Secondary | ICD-10-CM | POA: Insufficient documentation

## 2017-03-15 DIAGNOSIS — R319 Hematuria, unspecified: Secondary | ICD-10-CM | POA: Diagnosis not present

## 2017-03-15 HISTORY — DX: Presence of spectacles and contact lenses: Z97.3

## 2017-03-15 HISTORY — DX: Dysuria: R30.0

## 2017-03-15 HISTORY — DX: Dyspnea, unspecified: R06.00

## 2017-03-15 HISTORY — PX: TRANSURETHRAL RESECTION OF BLADDER TUMOR: SHX2575

## 2017-03-15 HISTORY — PX: CYSTOSCOPY W/ RETROGRADES: SHX1426

## 2017-03-15 HISTORY — DX: Type 2 diabetes mellitus without complications: E11.9

## 2017-03-15 HISTORY — DX: Old myocardial infarction: I25.2

## 2017-03-15 HISTORY — DX: Chronic obstructive pulmonary disease, unspecified: J44.9

## 2017-03-15 HISTORY — DX: Lower abdominal pain, unspecified: R10.30

## 2017-03-15 HISTORY — DX: Malignant neoplasm of bladder, unspecified: C67.9

## 2017-03-15 HISTORY — DX: Personal history of other diseases of male genital organs: Z87.438

## 2017-03-15 HISTORY — DX: Centrilobular emphysema: J43.2

## 2017-03-15 HISTORY — DX: Complete loss of teeth, unspecified cause, unspecified class: K08.109

## 2017-03-15 HISTORY — DX: Presence of dental prosthetic device (complete) (partial): Z97.2

## 2017-03-15 LAB — GLUCOSE, CAPILLARY
GLUCOSE-CAPILLARY: 145 mg/dL — AB (ref 65–99)
Glucose-Capillary: 162 mg/dL — ABNORMAL HIGH (ref 65–99)
Glucose-Capillary: 198 mg/dL — ABNORMAL HIGH (ref 65–99)
Glucose-Capillary: 205 mg/dL — ABNORMAL HIGH (ref 65–99)

## 2017-03-15 LAB — POCT I-STAT, CHEM 8
BUN: 20 mg/dL (ref 6–20)
CHLORIDE: 101 mmol/L (ref 101–111)
CREATININE: 1.9 mg/dL — AB (ref 0.61–1.24)
Calcium, Ion: 1.1 mmol/L — ABNORMAL LOW (ref 1.15–1.40)
Glucose, Bld: 178 mg/dL — ABNORMAL HIGH (ref 65–99)
HCT: 39 % (ref 39.0–52.0)
Hemoglobin: 13.3 g/dL (ref 13.0–17.0)
POTASSIUM: 4.4 mmol/L (ref 3.5–5.1)
SODIUM: 139 mmol/L (ref 135–145)
TCO2: 24 mmol/L (ref 22–32)

## 2017-03-15 SURGERY — TURBT (TRANSURETHRAL RESECTION OF BLADDER TUMOR)
Anesthesia: General

## 2017-03-15 MED ORDER — AMLODIPINE BESYLATE 5 MG PO TABS: 5.0000 mg | ORAL_TABLET | Freq: Every day | ORAL | Status: DC

## 2017-03-15 MED ORDER — INSULIN ASPART 100 UNIT/ML ~~LOC~~ SOLN
0.0000 [IU] | SUBCUTANEOUS | Status: DC
  Administered 2017-03-15: 3 [IU] via SUBCUTANEOUS
  Administered 2017-03-15: 2 [IU] via SUBCUTANEOUS
  Administered 2017-03-15: 3 [IU] via SUBCUTANEOUS
  Administered 2017-03-16 (×2): 2 [IU] via SUBCUTANEOUS

## 2017-03-15 MED ORDER — GENTAMICIN SULFATE 40 MG/ML IJ SOLN
5.0000 mg/kg | INTRAVENOUS | Status: AC
  Administered 2017-03-15: 330 mg via INTRAVENOUS
  Filled 2017-03-15 (×2): qty 8.25

## 2017-03-15 MED ORDER — MORPHINE SULFATE (PF) 4 MG/ML IV SOLN: 2.0000 mg | INTRAVENOUS | Status: DC | PRN

## 2017-03-15 MED ORDER — HYDROCODONE-ACETAMINOPHEN 7.5-325 MG PO TABS
1.0000 | ORAL_TABLET | Freq: Once | ORAL | Status: DC | PRN
  Filled 2017-03-15: qty 1

## 2017-03-15 MED ORDER — TIOTROPIUM BROMIDE MONOHYDRATE 18 MCG IN CAPS
18.0000 ug | ORAL_CAPSULE | Freq: Every day | RESPIRATORY_TRACT | Status: DC
  Administered 2017-03-16: 18 ug via RESPIRATORY_TRACT
  Filled 2017-03-15: qty 5

## 2017-03-15 MED ORDER — PANTOPRAZOLE SODIUM 40 MG PO TBEC
40.0000 mg | DELAYED_RELEASE_TABLET | Freq: Every day | ORAL | Status: DC
  Administered 2017-03-15 – 2017-03-16 (×2): 40 mg via ORAL
  Filled 2017-03-15 (×2): qty 1

## 2017-03-15 MED ORDER — LIDOCAINE HCL (CARDIAC) 20 MG/ML IV SOLN
INTRAVENOUS | Status: DC | PRN
  Administered 2017-03-15: 100 mg via INTRAVENOUS

## 2017-03-15 MED ORDER — DEXAMETHASONE SODIUM PHOSPHATE 10 MG/ML IJ SOLN
INTRAMUSCULAR | Status: DC | PRN
  Administered 2017-03-15: 10 mg via INTRAVENOUS

## 2017-03-15 MED ORDER — CIPROFLOXACIN HCL 500 MG PO TABS: 500.0000 mg | ORAL_TABLET | Freq: Once | ORAL | Status: DC

## 2017-03-15 MED ORDER — DOCUSATE SODIUM 100 MG PO CAPS
100.0000 mg | ORAL_CAPSULE | Freq: Two times a day (BID) | ORAL | Status: DC
  Administered 2017-03-15 – 2017-03-16 (×2): 100 mg via ORAL
  Filled 2017-03-15 (×2): qty 1

## 2017-03-15 MED ORDER — ACETAMINOPHEN 10 MG/ML IV SOLN
1000.0000 mg | Freq: Once | INTRAVENOUS | Status: DC | PRN
  Filled 2017-03-15: qty 100

## 2017-03-15 MED ORDER — GABAPENTIN 300 MG PO CAPS
ORAL_CAPSULE | ORAL | Status: AC
  Filled 2017-03-15: qty 1

## 2017-03-15 MED ORDER — IPRATROPIUM-ALBUTEROL 0.5-2.5 (3) MG/3ML IN SOLN: 3.0000 mL | Freq: Four times a day (QID) | RESPIRATORY_TRACT | Status: DC | PRN

## 2017-03-15 MED ORDER — BELLADONNA ALKALOIDS-OPIUM 16.2-60 MG RE SUPP: 1.0000 | Freq: Three times a day (TID) | RECTAL | Status: DC | PRN

## 2017-03-15 MED ORDER — KETOROLAC TROMETHAMINE 30 MG/ML IJ SOLN
INTRAMUSCULAR | Status: AC
  Filled 2017-03-15: qty 1

## 2017-03-15 MED ORDER — MOMETASONE FURO-FORMOTEROL FUM 200-5 MCG/ACT IN AERO
2.0000 | INHALATION_SPRAY | Freq: Two times a day (BID) | RESPIRATORY_TRACT | Status: DC
  Administered 2017-03-15 – 2017-03-16 (×2): 2 via RESPIRATORY_TRACT
  Filled 2017-03-15: qty 8.8

## 2017-03-15 MED ORDER — ONDANSETRON HCL 4 MG/2ML IJ SOLN
INTRAMUSCULAR | Status: AC
  Filled 2017-03-15: qty 2

## 2017-03-15 MED ORDER — ALBUTEROL SULFATE (2.5 MG/3ML) 0.083% IN NEBU
2.5000 mg | INHALATION_SOLUTION | Freq: Two times a day (BID) | RESPIRATORY_TRACT | Status: DC
  Administered 2017-03-15 – 2017-03-16 (×2): 2.5 mg via RESPIRATORY_TRACT
  Filled 2017-03-15 (×2): qty 3

## 2017-03-15 MED ORDER — HYDRALAZINE HCL 20 MG/ML IJ SOLN
5.0000 mg | INTRAMUSCULAR | Status: DC | PRN
Start: 2017-03-15 — End: 2017-03-16

## 2017-03-15 MED ORDER — ONDANSETRON HCL 4 MG/2ML IJ SOLN
INTRAMUSCULAR | Status: DC | PRN
  Administered 2017-03-15: 4 mg via INTRAVENOUS

## 2017-03-15 MED ORDER — AMLODIPINE BESYLATE 10 MG PO TABS
10.0000 mg | ORAL_TABLET | Freq: Every day | ORAL | Status: DC
  Administered 2017-03-16: 10 mg via ORAL
  Filled 2017-03-15: qty 1

## 2017-03-15 MED ORDER — LOSARTAN POTASSIUM 50 MG PO TABS
50.0000 mg | ORAL_TABLET | Freq: Every day | ORAL | Status: DC
  Administered 2017-03-15 – 2017-03-16 (×2): 50 mg via ORAL
  Filled 2017-03-15 (×2): qty 1

## 2017-03-15 MED ORDER — PROPOFOL 10 MG/ML IV BOLUS
INTRAVENOUS | Status: DC | PRN
  Administered 2017-03-15: 100 mg via INTRAVENOUS

## 2017-03-15 MED ORDER — LACTATED RINGERS IV SOLN
INTRAVENOUS | Status: DC
  Administered 2017-03-15 (×4): via INTRAVENOUS
  Filled 2017-03-15: qty 1000

## 2017-03-15 MED ORDER — ACETAMINOPHEN 500 MG PO TABS
1000.0000 mg | ORAL_TABLET | Freq: Once | ORAL | Status: AC
  Administered 2017-03-15: 1000 mg via ORAL
  Filled 2017-03-15: qty 2

## 2017-03-15 MED ORDER — BELLADONNA ALKALOIDS-OPIUM 16.2-60 MG RE SUPP
RECTAL | Status: DC | PRN
  Administered 2017-03-15: 1 via RECTAL

## 2017-03-15 MED ORDER — TAMSULOSIN HCL 0.4 MG PO CAPS
0.8000 mg | ORAL_CAPSULE | Freq: Every evening | ORAL | Status: DC
  Administered 2017-03-15: 0.8 mg via ORAL
  Filled 2017-03-15: qty 2

## 2017-03-15 MED ORDER — BISOPROLOL FUMARATE 5 MG PO TABS: 5.0000 mg | ORAL_TABLET | Freq: Every day | ORAL | Status: DC

## 2017-03-15 MED ORDER — FENTANYL CITRATE (PF) 100 MCG/2ML IJ SOLN
INTRAMUSCULAR | Status: AC
  Filled 2017-03-15: qty 2

## 2017-03-15 MED ORDER — FENTANYL CITRATE (PF) 100 MCG/2ML IJ SOLN
INTRAMUSCULAR | Status: DC | PRN
  Administered 2017-03-15: 50 ug via INTRAVENOUS
  Administered 2017-03-15 (×10): 25 ug via INTRAVENOUS

## 2017-03-15 MED ORDER — LIDOCAINE 2% (20 MG/ML) 5 ML SYRINGE
INTRAMUSCULAR | Status: AC
  Filled 2017-03-15: qty 5

## 2017-03-15 MED ORDER — ROSUVASTATIN CALCIUM 20 MG PO TABS
40.0000 mg | ORAL_TABLET | Freq: Every day | ORAL | Status: DC
  Administered 2017-03-15: 40 mg via ORAL
  Filled 2017-03-15 (×3): qty 2

## 2017-03-15 MED ORDER — ALBUTEROL SULFATE (2.5 MG/3ML) 0.083% IN NEBU: 2.5000 mg | INHALATION_SOLUTION | Freq: Four times a day (QID) | RESPIRATORY_TRACT | Status: DC | PRN

## 2017-03-15 MED ORDER — ONDANSETRON HCL 4 MG PO TABS: 4.0000 mg | ORAL_TABLET | Freq: Three times a day (TID) | ORAL | Status: DC | PRN

## 2017-03-15 MED ORDER — ACETAMINOPHEN 10 MG/ML IV SOLN
1000.0000 mg | Freq: Four times a day (QID) | INTRAVENOUS | Status: AC
  Administered 2017-03-15 – 2017-03-16 (×4): 1000 mg via INTRAVENOUS
  Filled 2017-03-15 (×4): qty 100

## 2017-03-15 MED ORDER — BELLADONNA ALKALOIDS-OPIUM 16.2-60 MG RE SUPP
RECTAL | Status: AC
  Filled 2017-03-15: qty 1

## 2017-03-15 MED ORDER — NITROGLYCERIN 0.4 MG SL SUBL: 0.4000 mg | SUBLINGUAL_TABLET | SUBLINGUAL | Status: DC | PRN

## 2017-03-15 MED ORDER — HYDROMORPHONE HCL 1 MG/ML IJ SOLN
0.2500 mg | INTRAMUSCULAR | Status: DC | PRN
  Filled 2017-03-15: qty 0.5

## 2017-03-15 MED ORDER — PROPOFOL 10 MG/ML IV BOLUS
INTRAVENOUS | Status: AC
Start: 2017-03-15 — End: ?
  Filled 2017-03-15: qty 40

## 2017-03-15 MED ORDER — GUAIFENESIN-CODEINE 100-10 MG/5ML PO SOLN
5.0000 mL | ORAL | Status: DC | PRN
  Administered 2017-03-15: 5 mL via ORAL
  Filled 2017-03-15: qty 5

## 2017-03-15 MED ORDER — DEXAMETHASONE SODIUM PHOSPHATE 10 MG/ML IJ SOLN
INTRAMUSCULAR | Status: AC
Start: 2017-03-15 — End: ?
  Filled 2017-03-15: qty 1

## 2017-03-15 MED ORDER — GABAPENTIN 300 MG PO CAPS
300.0000 mg | ORAL_CAPSULE | Freq: Once | ORAL | Status: AC
  Administered 2017-03-15: 300 mg via ORAL
  Filled 2017-03-15: qty 1

## 2017-03-15 MED ORDER — METFORMIN HCL 500 MG PO TABS: 500.0000 mg | ORAL_TABLET | Freq: Two times a day (BID) | ORAL | Status: DC

## 2017-03-15 MED ORDER — OXYCODONE HCL 5 MG PO TABS: 5.0000 mg | ORAL_TABLET | ORAL | Status: DC | PRN

## 2017-03-15 MED ORDER — CIPROFLOXACIN HCL 250 MG PO TABS
250.0000 mg | ORAL_TABLET | Freq: Two times a day (BID) | ORAL | Status: DC
  Administered 2017-03-15 – 2017-03-16 (×2): 250 mg via ORAL
  Filled 2017-03-15 (×2): qty 1

## 2017-03-15 MED ORDER — MEPERIDINE HCL 25 MG/ML IJ SOLN
6.2500 mg | INTRAMUSCULAR | Status: DC | PRN
  Filled 2017-03-15: qty 1

## 2017-03-15 MED ORDER — SODIUM CHLORIDE 0.45 % IV SOLN
INTRAVENOUS | Status: DC
  Administered 2017-03-15 (×2): via INTRAVENOUS

## 2017-03-15 MED ORDER — ACETAMINOPHEN 500 MG PO TABS
ORAL_TABLET | ORAL | Status: AC
Start: 2017-03-15 — End: ?
  Filled 2017-03-15: qty 2

## 2017-03-15 SURGICAL SUPPLY — 43 items
BAG DRAIN URO-CYSTO SKYTR STRL (DRAIN) ×4 IMPLANT
BAG DRN UROCATH (DRAIN) ×2
BAG URINE DRAINAGE (UROLOGICAL SUPPLIES) ×2 IMPLANT
BASKET LASER NITINOL 1.9FR (BASKET) IMPLANT
BASKET STNLS GEMINI 4WIRE 3FR (BASKET) IMPLANT
BASKET ZERO TIP NITINOL 2.4FR (BASKET) IMPLANT
BSKT STON RTRVL 120 1.9FR (BASKET)
BSKT STON RTRVL GEM 120X11 3FR (BASKET)
BSKT STON RTRVL ZERO TP 2.4FR (BASKET)
CATH FOLEY 2WAY SLVR  5CC 20FR (CATHETERS) ×2
CATH FOLEY 2WAY SLVR 5CC 20FR (CATHETERS) IMPLANT
CATH FOLEY 3WAY 30CC 22FR (CATHETERS) ×2 IMPLANT
CATH URET 5FR 28IN OPEN ENDED (CATHETERS) ×4 IMPLANT
CATH URET DUAL LUMEN 6-10FR 50 (CATHETERS) IMPLANT
CLOTH BEACON ORANGE TIMEOUT ST (SAFETY) ×4 IMPLANT
ELECT REM PT RETURN 9FT ADLT (ELECTROSURGICAL) ×4
ELECTRODE REM PT RTRN 9FT ADLT (ELECTROSURGICAL) IMPLANT
EVACUATOR MICROVAS BLADDER (UROLOGICAL SUPPLIES) IMPLANT
EXTRACTOR STONE 1.7FRX115CM (UROLOGICAL SUPPLIES) IMPLANT
FIBER LASER TRAC TIP (UROLOGICAL SUPPLIES) IMPLANT
GLOVE BIO SURGEON STRL SZ7.5 (GLOVE) ×4 IMPLANT
GOWN STRL REUS W/ TWL XL LVL3 (GOWN DISPOSABLE) ×4 IMPLANT
GOWN STRL REUS W/TWL XL LVL3 (GOWN DISPOSABLE) ×12 IMPLANT
GUIDEWIRE 0.038 PTFE COATED (WIRE) IMPLANT
GUIDEWIRE ANG ZIPWIRE 038X150 (WIRE) IMPLANT
GUIDEWIRE STR DUAL SENSOR (WIRE) ×4 IMPLANT
HOLDER FOLEY CATH W/STRAP (MISCELLANEOUS) ×2 IMPLANT
INFUSOR MANOMETER BAG 3000ML (MISCELLANEOUS) ×2 IMPLANT
IV NS IRRIG 3000ML ARTHROMATIC (IV SOLUTION) ×44 IMPLANT
KIT BALLIN UROMAX 15FX10 (LABEL) IMPLANT
KIT BALLN UROMAX 15FX4 (MISCELLANEOUS) IMPLANT
KIT BALLN UROMAX 26 75X4 (MISCELLANEOUS)
KIT TURNOVER CYSTO (KITS) ×4 IMPLANT
MANIFOLD NEPTUNE II (INSTRUMENTS) ×4 IMPLANT
NS IRRIG 500ML POUR BTL (IV SOLUTION) ×4 IMPLANT
PACK CYSTO (CUSTOM PROCEDURE TRAY) ×4 IMPLANT
SET HIGH PRES BAL DIL (LABEL)
SHEATH ACCESS URETERAL 38CM (SHEATH) IMPLANT
SYR 30ML LL (SYRINGE) ×2 IMPLANT
SYRINGE IRR TOOMEY STRL 70CC (SYRINGE) ×2 IMPLANT
TUBE CONNECTING 12'X1/4 (SUCTIONS) ×1
TUBE CONNECTING 12X1/4 (SUCTIONS) ×1 IMPLANT
WATER STERILE IRR 500ML POUR (IV SOLUTION) ×2 IMPLANT

## 2017-03-15 NOTE — H&P (Signed)
The patient presents today for follow-up with significant voiding symptoms and gross hematuria. He has a history of high-grade non-muscle invasive bladder cancer and has been getting induction therapy BCG. However, he only got 3/6 treatements because of hematuria and symptoms. His urine cultures of largely been unremarkable or negative. The patient is complaining of severe nocturia and a weak stream. He also is complaining of pressure and pain with voiding.  He was treated with Bactrim DS BID and had persistent symptoms. He was started on Cipro 500mg  BID two weeks ago.   Stent was placed at follow-up re-resection of his bladder tumor on 10/19/16   He presents today for f/u after starting Cipro. He actually stopped taking ciprofloxacin and started to take 400 mg of Advil twice daily and 1000 mg of Tylenol twice daily. His symptoms have improved significantly. He is no longer having any hematuria. The burning is improved. He is not having any more fevers.   CT 02/2016: Mass-like thickening of the left posterolateral bladder, likely reflecting residual/recurrent bladder neoplasm. Left double-pigtail ureteral stent in satisfactory position. Mil fullness of the left renal collecting system.   Interval: Presents today for cystoscopy to re-evaluate his bladder. CT scan at last visit showed residual tumor and the stent remains in good position.     ALLERGIES: None   MEDICATIONS: Cipro 500 mg tablet 1 tablet PO BID  Metformin Hcl 500 mg tablet  Mobic 15 mg tablet 1 tablet PO Daily  Omeprazole 20 mg capsule,delayed release  Tamsulosin Hcl 0.4 mg capsule 1 capsule PO Daily  Toviaz 4 mg tablet, extended release 24 hr  Albuterol Sulfate  Amlodipine Besylate 10 mg tablet tablet  Aspirin Ec 81 mg tablet, delayed release  Bisoprolol Fumarate 10 mg tablet  Budesonide  Guaifenesin  Ipratropium-Albuterol  Klor-Con 10  Losartan Potassium 50 mg tablet  Nitroglycerin 0.4 mg tablet, sublingual  Potassium  Chloride  Prednisone 10 mg tablet  Promethazine Hcl 12.5 mg tablet 1 tablet PO Q 4 H PRN  Pyridium 200 mg tablet 1 tablet PO Q 8 H PRN  Rosuvastatin Calcium 20 mg tablet  Spiriva     GU PSH: Bladder Instill AntiCA Agent - 12/19/2016, 11/28/2016, 11/21/2016 Cystoscopy - 08/13/2016 Cystoscopy Insert Stent, Left - 10/19/2016 Cystoscopy TURBT 2-5 cm - 10/19/2016, 09/07/2016 Cystoscopy Ureteroscopy, Left - 10/19/2016 Locm 300-399Mg /Ml Iodine,1Ml - 03/04/2017      PSH Notes: heart stent   NON-GU PSH: None   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   Tobacco Use Assessment Completed: Used Tobacco in last 30 days? Has never drank.  Drinks 1 caffeinated drink per day.    REVIEW OF SYSTEMS:    GU Review Male:   Patient reports frequent urination, hard to postpone urination, burning/ pain with urination, and penile pain. Patient denies get up at night to urinate, leakage of urine, stream starts and stops, trouble starting your stream, have to strain to urinate , and erection problems.  Gastrointestinal (Upper):   Patient reports nausea. Patient denies vomiting and indigestion/ heartburn.  Gastrointestinal (Lower):   Patient denies  diarrhea and constipation.  Constitutional:   Patient reports night sweats and fatigue. Patient denies fever and weight loss.  Skin:   Patient denies skin rash/ lesion and itching.  Eyes:   Patient reports blurred vision and double vision.   Ears/ Nose/  Throat:   Patient denies sore throat and sinus problems.  Hematologic/Lymphatic:   Patient denies swollen glands and easy bruising.  Cardiovascular:   Patient denies leg swelling and chest pains.  Respiratory:   Patient reports shortness of breath. Patient denies cough.  Endocrine:   Patient denies excessive thirst.  Musculoskeletal:   Patient denies back pain and joint pain.  Neurological:   Patient reports dizziness. Patient denies headaches.  Psychologic:   Patient reports anxiety. Patient denies depression.   Notes: blood in urine     VITAL SIGNS:      03/11/2017 09:15 AM  Weight 140 lb / 63.5 kg  Height 64 in / 162.56 cm  BP 157/89 mmHg  Heart Rate 105 /min  Temperature 98.3 F / 36.8 C  BMI 24.0 kg/m   PAST DATA REVIEWED:  Source Of History:  Patient   PROCEDURES:         Flexible Cystoscopy Left Stent Removal - 52310  Risks, benefits, and some of the potential complications of the procedure were discussed at length with the patient including infection, bleeding, voiding discomfort, urinary retention, fever, chills, sepsis, and others. All questions were answered. Informed consent was obtained. Antibiotic prophylaxis was given. Sterile technique and intraurethral analgesia were used.  Meatus:  Normal size. Normal location. Normal condition.  Urethra:  No strictures.  External Sphincter:  Normal.  Verumontanum:  Normal.  Prostate:  Non-obstructing. No hyperplasia.  Bladder Neck:  Non-obstructing.  Ureteral Orifices:  Normal location. Normal size. Normal shape. Effluxed clear urine.  Bladder:  A left lateral wall tumor x 2. A right lateral wall tumor. No trabeculation. Normal mucosa. No stones.  The left ureteral stent was carefully removed with a grasping instrument.    The lower urinary tract was carefully examined. The procedure was well-tolerated and without complications. Antibiotic instructions were given. Instructions were given to call the office immediately for  bloody urine, difficulty urinating, urinary retention, painful or frequent urination, fever, chills, nausea, vomiting or other illness. The patient stated that he understood these instructions and would comply with them.         Urinalysis w/Scope Dipstick Dipstick Cont'd Micro  Color: Red Bilirubin: Neg WBC/hpf: 0 - 5/hpf  Appearance: Cloudy Ketones: Neg RBC/hpf: >60/hpf  Specific Gravity: 1.020 Blood: 3+ Bacteria: NS (Not Seen)  pH: 6.5 Protein: 3+ Cystals: NS (Not Seen)  Glucose: Neg Urobilinogen: 0.2 Casts: NS (Not Seen)    Nitrites: Neg Trichomonas: Not Present    Leukocyte Esterase: 2+ Mucous: Not Present      Epithelial Cells: NS (Not Seen)      Yeast: NS (Not Seen)      Sperm: Not Present    Notes: MICROSCOPIC PERFORMED ON UNCONCENTRATED URINE    ASSESSMENT:      ICD-10 Details  1 GU:   Bladder Cancer overlapping sites - C67.8      PLAN:           Orders Labs Urine Culture          Document Letter(s):  Created for Patient: Clinical Summary         Notes:   The patient has 2 small tumors on the right lateral wall which I have recommended that  we proceed with re-resection of these areas. The patient has been through this process in the past, fully understands it. Given the patient's overall poor health and immobility I would like to get his primary care doctor to clear him prior to return to the operating room for reresection of his bladder tumor.   I spent 20 minutes of this office visit speaking directly to his daughter speaks English explained to her the whole process and his past care. Future, we will speak directly to her as she clearly has a better understanding was happening and speaks significantly better [name].   Cc: Dr. [name], MD

## 2017-03-15 NOTE — Anesthesia Procedure Notes (Addendum)
Procedure Name: LMA Insertion Date/Time: 03/15/2017 7:49 AM Performed by: Justice Rocher, CRNA Pre-anesthesia Checklist: Patient identified, Emergency Drugs available, Suction available and Patient being monitored Patient Re-evaluated:Patient Re-evaluated prior to induction Oxygen Delivery Method: Circle system utilized Preoxygenation: Pre-oxygenation with 100% oxygen Induction Type: IV induction Ventilation: Mask ventilation without difficulty LMA: LMA inserted LMA Size: 4.0 Number of attempts: 1 Airway Equipment and Method: Bite block Placement Confirmation: positive ETCO2 and breath sounds checked- equal and bilateral Tube secured with: Tape Dental Injury: Teeth and Oropharynx as per pre-operative assessment  Comments: Course breath sounds after LMA placed  Coughing preop

## 2017-03-15 NOTE — Transfer of Care (Signed)
Immediate Anesthesia Transfer of Care Note  Patient: [name]  Procedure(s) Performed: Procedure(s) (LRB): TRANSURETHRAL RESECTION OF BLADDER TUMOR (TURBT) (N/A) CYSTOSCOPY WITH RETROGRADE PYELOGRAM (Bilateral)  Patient Location: PACU  Anesthesia Type: General  Level of Consciousness: awake, sedated, patient cooperative and responds to stimulation  Airway & Oxygen Therapy: Patient Spontanous Breathing and Patient connected to [place] O2  Post-op Assessment: Report given to PACU RN, Post -op Vital signs reviewed and stable and Patient moving all extremities  Post vital signs: Reviewed and stable  Complications: No apparent anesthesia complications

## 2017-03-15 NOTE — Anesthesia Postprocedure Evaluation (Signed)
Anesthesia Post Note  Patient: [name]  Procedure(s) Performed: TRANSURETHRAL RESECTION OF BLADDER TUMOR (TURBT) (N/A ) CYSTOSCOPY WITH RETROGRADE PYELOGRAM (Bilateral )     Patient location during evaluation: PACU Anesthesia Type: General Level of consciousness: awake and alert Pain management: pain level controlled Vital Signs Assessment: post-procedure vital signs reviewed and stable Respiratory status: spontaneous breathing, nonlabored ventilation, respiratory function stable and patient connected to nasal cannula oxygen Cardiovascular status: blood pressure returned to baseline and stable Postop Assessment: no apparent nausea or vomiting Anesthetic complications: no    Last Vitals:  Vitals:   03/15/17 1145 03/15/17 1159  BP: (!) 146/76   Pulse: 86 86  Resp: 18 19  Temp:    SpO2: 95% 95%    Last Pain:  Vitals:   03/15/17 0546  TempSrc: Oral                 Barnet Glasgow

## 2017-03-15 NOTE — Op Note (Signed)
Preoperative diagnosis:  1. Bladder cancer, overlapping sites  Postoperative diagnosis:  1. Same  Procedure: 1. Transurethral resection of bladder tumor, greater than 5 cm 2. Bilateral retrograde pyelogram with interpretation  Surgeon: [name], MD  Anesthesia: General  Complications: None  Intraoperative findings:  #1: Previously described clots within the bladder actually were large necrotic tumors.  There were 4 tumors in total, the largest one was approximately 5 cm in size.  These were all resected completely. #2: The patient had tumor at the trigone, specifically involving the left intramural ureter.  This area was resected down through at least some of the muscularis propria with still visual tumor being seen.  This was highly concerning for muscle invasive bladder cancer. #3: There were additional tumors along the bladder neck which were resected from the 3:00 area to approximately the 9:00 area.  I also resected an area at 12:00. #4: I re-resected the patient's right ureteral orifice which also appeared to be involved in the tumor as well.   #5: The left retrograde pyelogram was performed using Omnipaque contrast.  This demonstrated mild hydroureteronephrosis with a filling defect in the intramural ureter.  There were no additional filling defects. #6: The right retrograde pyelogram was performed in a similar fashion and was completely normal with a normal caliber ureter, no filling defects, no blunting or abnormalities of the calyces.   EBL: Approximately 100 cc  Specimens: There were 4 large bladder tumors that were completely resected and sent as separate specimens.  Each tumor had a base resection as well.  There was also a specimen from the trigone, left trigone, bladder neck, and right ureteral orifice.  Indication: [name] is a 72 y.o. patient with history of high-grade non-muscle invasive bladder cancer who failed BCG..  After reviewing the management  options for treatment, he elected to proceed with the above surgical procedure(s). We have discussed the potential benefits and risks of the procedure, side effects of the proposed treatment, the likelihood of the patient achieving the goals of the procedure, and any potential problems that might occur during the procedure or recuperation. Informed consent has been obtained.  Description of procedure:  The patient was taken to the operating room and general anesthesia was induced.  The patient was placed in the dorsal lithotomy position, prepped and draped in the usual sterile fashion, and preoperative antibiotics were administered. A preoperative time-out was performed.   21 French 30 degree cystoscope was gently passed through the patient's urethra and into the bladder under visual guidance.  Subsequently performed a cystoscopy which demonstrated the above findings.  Next I used a 5 [place] open-ended ureteral catheter performed the right retrograde pyelogram without any issues and normal findings.  The left retrograde pyelogram was then performed after locating the left ureteral orifice which was encased in tumor.  The above findings were noted.  I then exchanged the 21 French sheath for a 26 French resectoscope sheath using the visual obturator.  I did have to dilate the patient's urethral meatus in order to accomplish this.  I exchange the obturator for the loop resectoscope.  I systematically went through the patient's bladder and removed all the bladder tumors as described above.  All the bases were re-resected and sent separately.  Hemostasis was noted to be quite good at the end of the resection.  I did opt to place a 20 [place] 2-way Foley catheter into the patient's bladder and placed a B&O suppository into his rectum.  He was  subsequently extubated and returned to the PACU in stable condition.  Given the extensive resection and the amount of anesthesia I am opting to admit the patient for  observation.  [name], M.D.

## 2017-03-16 DIAGNOSIS — Z7982 Long term (current) use of aspirin: Secondary | ICD-10-CM | POA: Diagnosis not present

## 2017-03-16 DIAGNOSIS — E119 Type 2 diabetes mellitus without complications: Secondary | ICD-10-CM | POA: Diagnosis not present

## 2017-03-16 DIAGNOSIS — Z7984 Long term (current) use of oral hypoglycemic drugs: Secondary | ICD-10-CM | POA: Diagnosis not present

## 2017-03-16 DIAGNOSIS — J449 Chronic obstructive pulmonary disease, unspecified: Secondary | ICD-10-CM | POA: Diagnosis not present

## 2017-03-16 DIAGNOSIS — E7800 Pure hypercholesterolemia, unspecified: Secondary | ICD-10-CM | POA: Diagnosis not present

## 2017-03-16 DIAGNOSIS — I10 Essential (primary) hypertension: Secondary | ICD-10-CM | POA: Diagnosis not present

## 2017-03-16 DIAGNOSIS — I2510 Atherosclerotic heart disease of native coronary artery without angina pectoris: Secondary | ICD-10-CM | POA: Diagnosis not present

## 2017-03-16 DIAGNOSIS — C678 Malignant neoplasm of overlapping sites of bladder: Secondary | ICD-10-CM | POA: Diagnosis not present

## 2017-03-16 DIAGNOSIS — K219 Gastro-esophageal reflux disease without esophagitis: Secondary | ICD-10-CM | POA: Diagnosis not present

## 2017-03-16 LAB — CBC
HCT: 41.6 % (ref 39.0–52.0)
HEMOGLOBIN: 13.6 g/dL (ref 13.0–17.0)
MCH: 28.8 pg (ref 26.0–34.0)
MCHC: 32.7 g/dL (ref 30.0–36.0)
MCV: 87.9 fL (ref 78.0–100.0)
Platelets: 323 10*3/uL (ref 150–400)
RBC: 4.73 MIL/uL (ref 4.22–5.81)
RDW: 14.6 % (ref 11.5–15.5)
WBC: 16.3 10*3/uL — AB (ref 4.0–10.5)

## 2017-03-16 LAB — BASIC METABOLIC PANEL
ANION GAP: 8 (ref 5–15)
BUN: 28 mg/dL — AB (ref 6–20)
CALCIUM: 7.9 mg/dL — AB (ref 8.9–10.3)
CO2: 25 mmol/L (ref 22–32)
Chloride: 102 mmol/L (ref 101–111)
Creatinine, Ser: 1.91 mg/dL — ABNORMAL HIGH (ref 0.61–1.24)
GFR calc Af Amer: 39 mL/min — ABNORMAL LOW (ref >60)
GFR calc non Af Amer: 33 mL/min — ABNORMAL LOW (ref >60)
GLUCOSE: 184 mg/dL — AB (ref 65–99)
Potassium: 5 mmol/L (ref 3.5–5.1)
SODIUM: 135 mmol/L (ref 135–145)

## 2017-03-16 LAB — GLUCOSE, CAPILLARY
Glucose-Capillary: 133 mg/dL — ABNORMAL HIGH (ref 65–99)
Glucose-Capillary: 110 mg/dL — ABNORMAL HIGH (ref 65–99)
Glucose-Capillary: 132 mg/dL — ABNORMAL HIGH (ref 65–99)

## 2017-03-16 NOTE — Progress Notes (Signed)
Foley care gone over with Son-in-law and patient. As well as discharge instructions. Both verbalized understanding. Supplies sent home with patient. Eulas Post, RN

## 2017-03-16 NOTE — Discharge Instructions (Signed)
Transurethral Resection of Bladder Tumor (TURBT) or Bladder Biopsy   Definition:  Transurethral Resection of the Bladder Tumor is a surgical procedure used to diagnose and remove tumors within the bladder. TURBT is the most common treatment for early stage bladder cancer.  General instructions:     Your recent bladder surgery requires very little post hospital care but some definite precautions.  Despite the fact that no skin incisions were used, the area around the bladder incisions are raw and covered with scabs to promote healing and prevent bleeding. Certain precautions are needed to insure that the scabs are not disturbed over the next 2-4 weeks while the healing proceeds.  Because the raw surface inside your bladder and the irritating effects of urine you may expect frequency of urination and/or urgency (a stronger desire to urinate) and perhaps even getting up at night more often. This will usually resolve or improve slowly over the healing period. You may see some blood in your urine over the first 6 weeks. Do not be alarmed, even if the urine was clear for a while. Get off your feet and drink lots of fluids until clearing occurs. If you start to pass clots or don't improve call us.  Diet:  You may return to your normal diet immediately. Because of the raw surface of your bladder, alcohol, spicy foods, foods high in acid and drinks with caffeine may cause irritation or frequency and should be used in moderation. To keep your urine flowing freely and avoid constipation, drink plenty of fluids during the day (8-10 glasses). Tip: Avoid cranberry juice because it is very acidic.  Activity:  Your physical activity doesn't need to be restricted. However, if you are very active, you may see some blood in the urine. We suggest that you reduce your activity under the circumstances until the bleeding has stopped.  Bowels:  It is important to keep your bowels regular during the postoperative  period. Straining with bowel movements can cause bleeding. A bowel movement every other day is reasonable. Use a mild laxative if needed, such as milk of magnesia 2-3 tablespoons, or 2 Dulcolax tablets. Call if you continue to have problems. If you had been taking narcotics for pain, before, during or after your surgery, you may be constipated. Take a laxative if necessary.    Medication:  You should resume your pre-surgery medications unless told not to. In addition you may be given an antibiotic to prevent or treat infection. Antibiotics are not always necessary. All medication should be taken as prescribed until the bottles are finished unless you are having an unusual reaction to one of the drugs.     Foley Catheter Care A soft, flexible tube (Foley catheter) may have been placed in your bladder to drain urine and fluid. Follow these instructions: Taking Care of the Catheter  Keep the area where the catheter leaves your body clean.   Attach the catheter to the leg so there is no tension on the catheter.   Keep the drainage bag below the level of the bladder, but keep it OFF the floor.   Do not take long soaking baths. Your caregiver will give instructions about showering.   Wash your hands before touching ANYTHING related to the catheter or bag.   Using mild soap and warm water on a washcloth:   Clean the area closest to the catheter insertion site using a circular motion around the catheter.   Clean the catheter itself by wiping AWAY from the insertion   site for several inches down the tube.   NEVER wipe upward as this could sweep bacteria up into the urethra (tube in your body that normally drains the bladder) and cause infection.   Place a small amount of sterile lubricant at the tip of the penis where the catheter is entering.  Taking Care of the Drainage Bags  Two drainage bags may be taken home: a large overnight drainage bag, and a smaller leg bag which fits underneath  clothing.   It is okay to wear the overnight bag at any time, but NEVER wear the smaller leg bag at night.   Keep the drainage bag well below the level of your bladder. This prevents backflow of urine into the bladder and allows the urine to drain freely.   Anchor the tubing to your leg to prevent pulling or tension on the catheter. Use tape or a leg strap provided by the hospital.   Empty the drainage bag when it is 1/2 to 3/4 full. Wash your hands before and after touching the bag.   Periodically check the tubing for kinks to make sure there is no pressure on the tubing which could restrict the flow of urine.  Changing the Drainage Bags  Cleanse both ends of the clean bag with alcohol before changing.   Pinch off the rubber catheter to avoid urine spillage during the disconnection.   Disconnect the dirty bag and connect the clean one.   Empty the dirty bag carefully to avoid a urine spill.   Attach the new bag to the leg with tape or a leg strap.  Cleaning the Drainage Bags  Whenever a drainage bag is disconnected, it must be cleaned quickly so it is ready for the next use.   Wash the bag in warm, soapy water.   Rinse the bag thoroughly with warm water.   Soak the bag for 30 minutes in a solution of white vinegar and water (1 cup vinegar to 1 quart warm water).   Rinse with warm water.  SEEK MEDICAL CARE IF:   You have chills or night sweats.   You are leaking around your catheter or have problems with your catheter. It is not uncommon to have sporadic leakage around your catheter as a result of bladder spasms. If the leakage stops, there is not much need for concern. If you are uncertain, call your caregiver.   You develop side effects that you think are coming from your medicines.  SEEK IMMEDIATE MEDICAL CARE IF:   You are suddenly unable to urinate. Check to see if there are any kinks in the drainage tubing that may cause this. If you cannot find any kinks, call your  caregiver immediately. This is an emergency.   You develop shortness of breath or chest pains.   Bleeding persists or clots develop in your urine.   You have a fever.   You develop pain in your back or over your lower belly (abdomen).   You develop pain or swelling in your legs.   Any problems you are having get worse rather than better.  MAKE SURE YOU:   Understand these instructions.   Will watch your condition.   Will get help right away if you are not doing well or get worse.   

## 2017-03-16 NOTE — Progress Notes (Signed)
Urology Inpatient Progress Report  RECURRENT BLADDER CANCER  Procedure(s): TRANSURETHRAL RESECTION OF BLADDER TUMOR (TURBT) CYSTOSCOPY WITH RETROGRADE PYELOGRAM  1 Day Post-Op   Intv/Subj: No acute events overnight. Patient is without complaint.  Active Problems:   Bladder cancer ([place])  Current Facility-Administered Medications  Medication Dose Route Frequency Provider Last Rate Last Dose  . 0.45 % sodium chloride infusion   Intravenous Continuous [name], MD 100 mL/hr at 03/15/17 2325    . acetaminophen (OFIRMEV) IV 1,000 mg  1,000 mg Intravenous Q6H [name], MD   Stopped at 03/16/17 0981  . albuterol (PROVENTIL) (2.5 MG/3ML) 0.083% nebulizer solution 2.5 mg  2.5 mg Nebulization BID [name], MD   2.5 mg at 03/16/17 0936  . amLODipine (NORVASC) tablet 10 mg  10 mg Oral Daily [name], MD   10 mg at 03/16/17 0846  . ciprofloxacin (CIPRO) tablet 250 mg  250 mg Oral BID [name], MD   250 mg at 03/16/17 0847  . docusate sodium (COLACE) capsule 100 mg  100 mg Oral BID [name], MD   100 mg at 03/16/17 0847  . guaiFENesin-codeine 100-10 MG/5ML solution 5 mL  5 mL Oral Q4H PRN [name], MD   5 mL at 03/15/17 1539  . hydrALAZINE (APRESOLINE) injection 5 mg  5 mg Intravenous Q4H PRN [name], MD      . insulin aspart (novoLOG) injection 0-15 Units  0-15 Units Subcutaneous Q4H [name], MD   2 Units at 03/16/17 0532  . ipratropium-albuterol (DUONEB) 0.5-2.5 (3) MG/3ML nebulizer solution 3 mL  3 mL Nebulization Q6H PRN [name], MD      . losartan (COZAAR) tablet 50 mg  50 mg Oral Daily [name], MD   50 mg at 03/16/17 0847  . mometasone-formoterol (DULERA) 200-5 MCG/ACT inhaler 2 puff  2 puff Inhalation BID [name], MD   2 puff at 03/16/17 [phone]  . morphine 4 MG/ML injection 2 mg  2 mg Intravenous Q1H PRN [name], MD      . nitroGLYCERIN  (NITROSTAT) SL tablet 0.4 mg  0.4 mg Sublingual Q5 Min x 3 PRN [name], MD      . ondansetron [facility]) tablet 4 mg  4 mg Oral Q8H PRN [name], MD      . opium-belladonna (B&O SUPPRETTES) 16.2-60 MG suppository 1 suppository  1 suppository Rectal Q8H PRN [name], MD      . oxyCODONE (Oxy IR/ROXICODONE) immediate release tablet 5-10 mg  5-10 mg Oral Q4H PRN [name], MD      . pantoprazole (PROTONIX) EC tablet 40 mg  40 mg Oral Daily [name], MD   40 mg at 03/16/17 0847  . rosuvastatin (CRESTOR) tablet 40 mg  40 mg Oral q1800 [name], MD   40 mg at 03/15/17 1825  . tamsulosin (FLOMAX) capsule 0.8 mg  0.8 mg Oral QPM [name], MD   0.8 mg at 03/15/17 1825  . tiotropium (SPIRIVA) inhalation capsule 18 mcg  18 mcg Inhalation Daily [name], MD   18 mcg at 03/16/17 0936     Objective: Vital: Vitals:   03/15/17 1400 03/15/17 2020 03/15/17 2021 03/16/17 0435  BP: 121/69 118/67  128/65  Pulse: 85 75  84  Resp: 18 18  18   Temp:  98.1 F (36.7 C)  98.4 F (36.9 C)  TempSrc:  Oral  Oral  SpO2: 94% 93% 93% 93%  Weight:      Height:       I/Os: I/O last 3 completed shifts: In: 3646.7 [P.O.:480; I.V.:2966.7; IV Piggyback:200] Out: 3100 [Urine:3000; Blood:100]  Physical Exam:  General: Patient is in no apparent distress Lungs: Normal respiratory effort, chest expands symmetrically. GI: The abdomen is soft and nontender without mass. Foley: clear yellow urine  Ext: lower extremities symmetric  Lab Results: Recent Labs    03/15/17 0631 03/16/17 0946  WBC  --  16.3*  HGB 13.3 13.6  HCT 39.0 41.6   Recent Labs    03/15/17 0631 03/16/17 0946  NA 139 135  K 4.4 5.0  CL 101 102  CO2  --  25  GLUCOSE 178* 184*  BUN 20 28*  CREATININE 1.90* 1.91*  CALCIUM  --  7.9*   No results for input(s): LABPT, INR in the last 72 hours. No results for input(s): LABURIN in the last 72 hours. Results  for orders placed or performed during the hospital encounter of 08/03/16  Culture, blood (Routine x 2)     Status: None   Collection Time: 08/03/16  9:06 AM  Result Value Ref Range Status   Specimen Description BLOOD LEFT ANTECUBITAL  Final   Special Requests   Final    BOTTLES DRAWN AEROBIC AND ANAEROBIC Blood Culture adequate volume   Culture NO GROWTH 5 DAYS  Final   Report Status 08/08/2016 FINAL  Final  Urine culture     Status: None   Collection Time: 08/03/16  9:37 AM  Result Value Ref Range Status   Specimen Description URINE, RANDOM  Final   Special Requests NONE  Final   Culture NO GROWTH  Final   Report Status 08/04/2016 FINAL  Final  Culture, blood (Routine x 2)     Status: None   Collection Time: 08/03/16 10:00 AM  Result Value Ref Range Status   Specimen Description BLOOD RIGHT ANTECUBITAL  Final   Special Requests   Final    BOTTLES DRAWN AEROBIC AND ANAEROBIC Blood Culture adequate volume   Culture NO GROWTH 5 DAYS  Final   Report Status 08/08/2016 FINAL  Final  MRSA PCR Screening     Status: None   Collection Time: 08/03/16  5:24 PM  Result Value Ref Range Status   MRSA by PCR NEGATIVE NEGATIVE Final    Comment:        The GeneXpert MRSA Assay (FDA approved for NASAL specimens only), is one component of a comprehensive MRSA colonization surveillance program. It is not intended to diagnose MRSA infection nor to guide or monitor treatment for MRSA infections.     Studies/Results: No results found.  Assessment: Procedure(s): TRANSURETHRAL RESECTION OF BLADDER TUMOR (TURBT) CYSTOSCOPY WITH RETROGRADE PYELOGRAM, 1 Day Post-Op  doing well.  Plan: Home today with his catheter.   [name], MD Urology 03/16/2017, 11:36 AM

## 2017-03-16 NOTE — Discharge Summary (Signed)
Date of admission: 03/15/2017  Date of discharge: 03/16/2017  Admission diagnosis: Bladder cancer  Discharge diagnosis: Same recurrent bladder cancer  Secondary diagnoses:  Patient Active Problem List   Diagnosis Date Noted  . Bladder cancer ([place]) 03/15/2017  . Sepsis ([place]) 08/03/2016  . Hematuria 08/03/2016  . Diabetes mellitus, type 2 ([place]) 06/01/2015  . Acute on chronic respiratory failure with hypercapnia ([place])   . Diabetes mellitus with complication ([place])   . Dysuria 03/01/2015  . Type 2 diabetes mellitus with circulatory disorder ([place])   . Hypokalemia   . PVC's (premature ventricular contractions)   . Shortness of breath 05/01/2014  . SIRS (systemic inflammatory response syndrome) ([place]) 05/01/2014  . COPD exacerbation ([place]) 05/01/2014  . HTN (hypertension) 05/01/2014  . CAD (coronary artery disease), native coronary artery 05/01/2014  . Acute respiratory failure with hypoxia ([place]) 05/01/2014  . Uncontrolled diabetes mellitus without complication ([place]) 03/54/6568  . SOB (shortness of breath)   . Blood poisoning   . Chest pain, negative MI, secondary due to COPD exacerbation  08/04/2013  . Abnormal stress test 07/02/2012  . STEMI (ST elevation myocardial infarction) ([place]) 02/17/2012  . Hyperlipidemia 02/17/2012  . Essential hypertension 02/17/2012  . Tobacco abuse, has stopped 02/17/2012  . Allergic rhinitis 02/17/2012  . Coronary atherosclerosis 06/14/2009  . COPD with exacerbation ([place]) 05/31/2009  . OBSTRUCTIVE CHRONIC BRONCHITIS 11/13/2006  . GERD 11/13/2006    Procedures performed: Procedure(s): TRANSURETHRAL RESECTION OF BLADDER TUMOR (TURBT) CYSTOSCOPY WITH RETROGRADE PYELOGRAM  History and Physical: For full details, please see admission history and physical. Briefly, Khalid Zenner is a 72 y.o. year old patient with recurrent bladder cancer who underwent a transurethral resection of his bladder tumor.  He was admitted for observation.  Hospital Course: Patient  tolerated the procedure well.  He was then transferred to the floor after an uneventful PACU stay.  His hospital course was uncomplicated.  On POD#1 he had met discharge criteria: was eating a regular diet, was up and ambulating independently,  pain was well controlled,  and was ready to for discharge.  The patient was discharged home with his Foley catheter and given catheter care instructions.  Laboratory values:  Recent Labs    03/15/17 0631 03/16/17 0946  WBC  --  16.3*  HGB 13.3 13.6  HCT 39.0 41.6   Recent Labs    03/15/17 0631 03/16/17 0946  NA 139 135  K 4.4 5.0  CL 101 102  CO2  --  25  GLUCOSE 178* 184*  BUN 20 28*  CREATININE 1.90* 1.91*  CALCIUM  --  7.9*   No results for input(s): LABPT, INR in the last 72 hours. No results for input(s): LABURIN in the last 72 hours. Results for orders placed or performed during the hospital encounter of 08/03/16  Culture, blood (Routine x 2)     Status: None   Collection Time: 08/03/16  9:06 AM  Result Value Ref Range Status   Specimen Description BLOOD LEFT ANTECUBITAL  Final   Special Requests   Final    BOTTLES DRAWN AEROBIC AND ANAEROBIC Blood Culture adequate volume   Culture NO GROWTH 5 DAYS  Final   Report Status 08/08/2016 FINAL  Final  Urine culture     Status: None   Collection Time: 08/03/16  9:37 AM  Result Value Ref Range Status   Specimen Description URINE, RANDOM  Final   Special Requests NONE  Final   Culture NO GROWTH  Final   Report Status 08/04/2016  FINAL  Final  Culture, blood (Routine x 2)     Status: None   Collection Time: 08/03/16 10:00 AM  Result Value Ref Range Status   Specimen Description BLOOD RIGHT ANTECUBITAL  Final   Special Requests   Final    BOTTLES DRAWN AEROBIC AND ANAEROBIC Blood Culture adequate volume   Culture NO GROWTH 5 DAYS  Final   Report Status 08/08/2016 FINAL  Final  MRSA PCR Screening     Status: None   Collection Time: 08/03/16  5:24 PM  Result Value Ref Range Status    MRSA by PCR NEGATIVE NEGATIVE Final    Comment:        The GeneXpert MRSA Assay (FDA approved for NASAL specimens only), is one component of a comprehensive MRSA colonization surveillance program. It is not intended to diagnose MRSA infection nor to guide or monitor treatment for MRSA infections.     Disposition: Home  Discharge instruction: The patient was instructed to be ambulatory but told to refrain from heavy lifting, strenuous activity, or driving.   Discharge medications:  Allergies as of 03/16/2017   No Known Allergies     Medication List    TAKE these medications   acetaminophen 500 MG tablet Commonly known as:  TYLENOL Take 500 mg by mouth every 8 (eight) hours as needed for mild pain, moderate pain, fever or headache.   albuterol (2.5 MG/3ML) 0.083% nebulizer solution Commonly known as:  PROVENTIL Take 2.5 mg by nebulization every 6 (six) hours as needed for wheezing or shortness of breath.   albuterol 108 (90 Base) MCG/ACT inhaler Commonly known as:  PROVENTIL HFA Inhale 2 puffs into the lungs 2 (two) times daily.   amLODipine 10 MG tablet Commonly known as:  NORVASC Take 10 mg by mouth daily.   amLODipine 5 MG tablet Commonly known as:  NORVASC TAKE 1 TABLET (5 MG TOTAL) BY MOUTH DAILY.   aspirin EC 81 MG tablet Take 81 mg by mouth daily.   bisoprolol 10 MG tablet Commonly known as:  ZEBETA Take 0.5 tablets (5 mg total) by mouth daily.   budesonide-formoterol 160-4.5 MCG/ACT inhaler Commonly known as:  SYMBICORT Inhale 2 puffs into the lungs 2 (two) times daily.   ibuprofen 200 MG tablet Commonly known as:  ADVIL,MOTRIN Take 200 mg by mouth every 8 (eight) hours as needed for fever, headache, mild pain, moderate pain or cramping.   ipratropium-albuterol 0.5-2.5 (3) MG/3ML Soln Commonly known as:  DUONEB Take 3 mLs by nebulization every 6 (six) hours as needed.   KLOR-CON M20 20 MEQ tablet Generic drug:  potassium chloride SA TAKE 1  TABLET BY MOUTH EVERY DAY   losartan 50 MG tablet Commonly known as:  COZAAR Take 1 tablet (50 mg total) by mouth daily.   metFORMIN 500 MG tablet Commonly known as:  GLUCOPHAGE Take 500 mg by mouth 2 (two) times daily with a meal.   nitroGLYCERIN 0.4 MG SL tablet Commonly known as:  NITROSTAT Place 1 tablet (0.4 mg total) under the tongue every 5 (five) minutes as needed for chest pain.   omeprazole 20 MG capsule Commonly known as:  PRILOSEC Take 1 capsule (20 mg total) by mouth 2 (two) times daily before a meal.   rosuvastatin 40 MG tablet Commonly known as:  CRESTOR TAKE 1 TABLET BY MOUTH EVERY DAY   tamsulosin 0.4 MG Caps capsule Commonly known as:  FLOMAX Take 2 capsules (0.8 mg total) by mouth every evening. What changed:  how  much to take   tiotropium 18 MCG inhalation capsule Commonly known as:  SPIRIVA HANDIHALER Place 1 capsule (18 mcg total) into inhaler and inhale daily.   TOVIAZ 4 MG Tb24 tablet Generic drug:  fesoterodine Take 4 mg by mouth every evening.       Followup:  Follow-up Information    [name], MD On 03/29/2017.   Specialty:  Urology Why:  2:45pm Contact information: [address] [place] [address] [phone]

## 2017-03-18 ENCOUNTER — Encounter (HOSPITAL_BASED_OUTPATIENT_CLINIC_OR_DEPARTMENT_OTHER): Payer: Self-pay | Admitting: Urology

## 2017-03-20 DIAGNOSIS — R310 Gross hematuria: Secondary | ICD-10-CM | POA: Diagnosis not present

## 2017-03-26 DIAGNOSIS — C672 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: Secondary | ICD-10-CM | POA: Diagnosis not present

## 2017-03-27 ENCOUNTER — Encounter: Payer: Self-pay | Admitting: Oncology

## 2017-04-05 ENCOUNTER — Inpatient Hospital Stay: Payer: Medicare HMO | Attending: Oncology | Admitting: Oncology

## 2017-04-05 ENCOUNTER — Telehealth: Payer: Self-pay | Admitting: Oncology

## 2017-04-05 VITALS — BP 134/69 | HR 92 | Temp 98.8°F | Resp 16 | Ht 64.0 in | Wt 152.6 lb

## 2017-04-05 DIAGNOSIS — C678 Malignant neoplasm of overlapping sites of bladder: Secondary | ICD-10-CM

## 2017-04-05 DIAGNOSIS — C679 Malignant neoplasm of bladder, unspecified: Secondary | ICD-10-CM | POA: Diagnosis not present

## 2017-04-05 MED ORDER — PROCHLORPERAZINE MALEATE 10 MG PO TABS: 10.0000 mg | ORAL_TABLET | Freq: Four times a day (QID) | ORAL | 0 refills | Status: DC | PRN

## 2017-04-05 NOTE — Progress Notes (Signed)
START OFF PATHWAY REGIMEN - Bladder   [id number]:Carboplatin AUC=2:   Administer weekly:     Carboplatin   **Always confirm dose/schedule in your pharmacy ordering system**    Patient Characteristics: Pre Cystectomy, Clinical T2-T4a, N0-1, M0, Cystectomy Ineligible/Patient Refuses Cystectomy AJCC M Category: M0 AJCC N Category: N0 AJCC T Category: T2 Current evidence of distant metastases? No AJCC 8 Stage Grouping: II Intent of Therapy: Curative Intent, Discussed with Patient 

## 2017-04-05 NOTE — Progress Notes (Signed)
Reason for Referral: Bladder cancer  HPI: 72 year old gentleman currently of [place].  He has history of coronary artery disease, COPD and recurrent superficial bladder tumors.  He has had BCG in the past and received 3 out of 6 treatments due to hematuria.  He underwent a cystoscopy under the care of Dr. [name] on 03/15/2017.  The cystoscopy showed multiple necrotic tumors of at least 4 with the largest around 5 cm that were resected completely.  Hydronephrosis was also noted that is mild without any stenting placed.  The final pathology showed high-grade urothelial carcinoma with muscle involvement around the left trigone as well as the right ureteral orifice.  Multiple samples were also taken showed high-grade urothelial carcinoma with no muscle involvement including the right posterior lateral base and the posterior wall.  CT scan of the abdomen and pelvis in February 2019 did not show any evidence of metastatic disease.  Since his operation, he reported no hematuria or dysuria.  He does report some pelvic discomfort.  His performance status is adequate and able to ambulate with the help of a cane without any falls or syncope.  He does not report any headaches, blurry vision, syncope or seizures. Does not report any fevers, chills or sweats.  His appetite remained reasonable and weight is stable. Does not report any cough, wheezing or hemoptysis.  Does not report any chest pain, palpitation, orthopnea or leg edema.  Does not report any nausea, vomiting or abdominal pain.  Does not report any constipation or diarrhea.  Does not report any skeletal complaints.    Does not report frequency, urgency or hematuria.  Does not report any skin rashes or lesions. Does not report any heat or cold intolerance.  Does not report any lymphadenopathy or petechiae.  Does not report any anxiety or depression.  Remaining review of systems is negative.    Past Medical History:  Diagnosis Date  . Allergic rhinitis   .  CAD (coronary artery disease), native coronary artery cardiologist-  [name]   a. thought due to vasospasm . cath - CAD LAD 40-50%, LCX 40%, RCA 25% EF 65%. lexiscan myoview EF 68% w/o evidence of ischemia or infarct b. STEMI s/p BMS-mid RCA c. BMS x 2- prox RCA, PLOM  . 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 COPD, group C, by GOLD 2017 classification [facility]) pulmonologist-- dr mcquid   former smoker--- severe OSA w/ hx multiple hospital admissions w/ exacerbation's  . Dyspnea   . Dysuria   . Full dentures   . GERD (gastroesophageal reflux disease)   . History of non-ST elevation myocardial infarction (NSTEMI) 06/19/2009   in setting COPD w/ exacerbation;  negative myoview for ischemia;  previously had cardiac cath 06-08-2009 w/ diffuse nonobtructive CAD and  normal LVF  . History of prostatitis   . History of ST elevation myocardial infarction (STEMI) 02/15/2012   in setting of COPD w/ exacerbation; per cardiac cath PCI and stenting to RCA  . HTN (hypertension)   . Hyperlipidemia   . Lower abdominal pain   . Recurrent malignant neoplasm of bladder [facility]) urologist-  dr [name]   previously dx 08/ 2018 and 10/ 2018 w/ high grade urothelial carcinoma with no muscle involvement  . Type 2 diabetes mellitus ([place])   . Wears glasses   :  Past Surgical History:  Procedure Laterality Date  . CARDIAC CATHETERIZATION N/A 12/27/2014   Procedure: Left Heart Cath and Coronary Angiography;  Surgeon: Jettie Booze, MD;  Location: [place] [facility]  [facility];  Service: Cardiovascular;  Laterality: N/A; ost.LAD to pLAD 40%, ost.D2 to D2 80%, mCFx 50%, mild to moderate left sided disease, patent RCA stents, normal LVF  . CARDIAC CATHETERIZATION  06-08-2009   dr cooper   diffuse non-obstructive CAD , normal LVF  . CARDIOVASCULAR STRESS TEST  06-25-2012  [name]   intermediate risk nuclear stress study w/ medium-sized, mild primary reversible basal to mid inferior perfusion defect suggestive of ischemia/  normal  LV function and wall motion ,ef 63%/  pt scheduled for cardiac cath  . CYSTOSCOPY W/ RETROGRADES Bilateral 09/07/2016   Procedure: CYSTOSCOPY WITH BILATERAL RETROGRADE PYELOGRAM;  Surgeon: [name], MD;  Location: [facility];  Service: Urology;  Laterality: Bilateral;  . CYSTOSCOPY W/ RETROGRADES Bilateral 03/15/2017   Procedure: CYSTOSCOPY WITH RETROGRADE PYELOGRAM;  Surgeon: [name], MD;  Location: [facility];  Service: Urology;  Laterality: Bilateral;  . CYSTOSCOPY WITH RETROGRADE PYELOGRAM, URETEROSCOPY AND STENT PLACEMENT Left 10/19/2016   Procedure: CYSTOSCOPY WITH RETROGRADE PYELOGRAM, URETEROSCOPY AND STENT PLACEMENT;  Surgeon: [name], MD;  Location: [facility];  Service: Urology;  Laterality: Left;  . left foot surgery     repair - and ankle   . LEFT HEART CATHETERIZATION WITH CORONARY ANGIOGRAM N/A 02/15/2012   Procedure: LEFT HEART CATHETERIZATION WITH CORONARY ANGIOGRAM;  Surgeon: Peter M [name], MD;  Location: [facility] CATH LAB;  Service: Cardiovascular;  Laterality: N/A;  single vessel obstructive disease of the mid RCA, normal LVF  . LEFT HEART CATHETERIZATION WITH CORONARY ANGIOGRAM N/A 07/01/2012   Procedure: LEFT HEART CATHETERIZATION WITH CORONARY ANGIOGRAM;  Surgeon: Peter M [name], MD;  Location: [facility] CATH LAB;  Service: Cardiovascular;  Laterality: N/A;  class III angina and positive stress test; patent mRCA stent;  severe obstructive single vessel disease to new lesion proximal RCA & PLOM branch; normal LVF  . LEFT HEART CATHETERIZATION WITH CORONARY ANGIOGRAM N/A 08/03/2013   Procedure: LEFT HEART CATHETERIZATION WITH CORONARY ANGIOGRAM;  Surgeon: Peter M [name], MD;  Location: [facility] CATH LAB;  Service: Cardiovascular;  Laterality: N/A;  nonobstuctive CAD, normal LVF, stents patent mRCA and pRCA, mild diffuse disease up to 30% of stent in the PLOM  . PERCUTANEOUS CORONARY STENT INTERVENTION (PCI-S) N/A 02/15/2012   Procedure: PERCUTANEOUS CORONARY STENT  INTERVENTION (PCI-S);  Surgeon: Peter M [name], MD;  Location: [facility] CATH LAB;  Service: Cardiovascular;  Laterality: N/A;   BMS x1 to midRCA  . PERCUTANEOUS CORONARY STENT INTERVENTION (PCI-S)  07/01/2012   Procedure: PERCUTANEOUS CORONARY STENT INTERVENTION (PCI-S);  Surgeon: Peter M [name], MD;  Location: [facility] CATH LAB;  Service: Cardiovascular;;  BMS to pRCA; BMS to PLOM  . TRANSTHORACIC ECHOCARDIOGRAM  08/05/2016   moderate LVH, ef 98-33%, grade 1 diastolic dysfunction/ mild PR and TR, trivial pericardial effusion/   . TRANSURETHRAL RESECTION OF BLADDER TUMOR N/A 09/07/2016   Procedure: TRANSURETHRAL RESECTION OF BLADDER TUMOR (TURBT);  Surgeon: [name], MD;  Location: [facility];  Service: Urology;  Laterality: N/A;  . TRANSURETHRAL RESECTION OF BLADDER TUMOR N/A 10/19/2016   Procedure: TRANSURETHRAL RESECTION OF BLADDER TUMOR (TURBT);  Surgeon: [name], MD;  Location: [facility];  Service: Urology;  Laterality: N/A;  . TRANSURETHRAL RESECTION OF BLADDER TUMOR N/A 03/15/2017   Procedure: TRANSURETHRAL RESECTION OF BLADDER TUMOR (TURBT);  Surgeon: [name], MD;  Location: [facility];  Service: Urology;  Laterality: N/A;  :   Current Outpatient Medications:  .  acetaminophen (TYLENOL) 500 MG tablet, Take 500  mg by mouth every 8 (eight) hours as needed for mild pain, moderate pain, fever or headache., Disp: , Rfl:  .  albuterol (PROVENTIL HFA) 108 (90 BASE) MCG/ACT inhaler, Inhale 2 puffs into the lungs 2 (two) times daily., Disp: 1 Inhaler, Rfl: 1 .  albuterol (PROVENTIL) (2.5 MG/3ML) 0.083% nebulizer solution, Take 2.5 mg by nebulization every 6 (six) hours as needed for wheezing or shortness of breath., Disp: , Rfl:  .  amLODipine (NORVASC) 10 MG tablet, Take 10 mg by mouth daily., Disp: , Rfl:  .  amLODipine (NORVASC) 5 MG tablet, TAKE 1 TABLET (5 MG TOTAL) BY MOUTH DAILY. (Patient not taking: Reported on 03/15/2017), Disp: 30 tablet, Rfl: 9 .  aspirin EC 81  MG tablet, Take 81 mg by mouth daily., Disp: , Rfl:  .  bisoprolol (ZEBETA) 10 MG tablet, Take 0.5 tablets (5 mg total) by mouth daily., Disp: 30 tablet, Rfl: 11 .  budesonide-formoterol (SYMBICORT) 160-4.5 MCG/ACT inhaler, Inhale 2 puffs into the lungs 2 (two) times daily., Disp: 1 Inhaler, Rfl: 1 .  ibuprofen (ADVIL,MOTRIN) 200 MG tablet, Take 200 mg by mouth every 8 (eight) hours as needed for fever, headache, mild pain, moderate pain or cramping., Disp: , Rfl:  .  ipratropium-albuterol (DUONEB) 0.5-2.5 (3) MG/3ML SOLN, Take 3 mLs by nebulization every 6 (six) hours as needed., Disp: 360 mL, Rfl: 1 .  KLOR-CON M20 20 MEQ tablet, TAKE 1 TABLET BY MOUTH EVERY DAY, Disp: 30 tablet, Rfl: 5 .  losartan (COZAAR) 50 MG tablet, Take 1 tablet (50 mg total) by mouth daily., Disp: 30 tablet, Rfl: 11 .  metFORMIN (GLUCOPHAGE) 500 MG tablet, Take 500 mg by mouth 2 (two) times daily with a meal. , Disp: , Rfl:  .  nitroGLYCERIN (NITROSTAT) 0.4 MG SL tablet, Place 1 tablet (0.4 mg total) under the tongue every 5 (five) minutes as needed for chest pain., Disp: 25 tablet, Rfl: 3 .  omeprazole (PRILOSEC) 20 MG capsule, Take 1 capsule (20 mg total) by mouth 2 (two) times daily before a meal., Disp: 60 capsule, Rfl: 6 .  rosuvastatin (CRESTOR) 40 MG tablet, TAKE 1 TABLET BY MOUTH EVERY DAY, Disp: 90 tablet, Rfl: 1 .  tamsulosin (FLOMAX) 0.4 MG CAPS capsule, Take 2 capsules (0.8 mg total) by mouth every evening. (Patient taking differently: Take 0.4 mg by mouth every evening. ), Disp: 30 capsule, Rfl: 2 .  tiotropium (SPIRIVA HANDIHALER) 18 MCG inhalation capsule, Place 1 capsule (18 mcg total) into inhaler and inhale daily., Disp: 30 capsule, Rfl: 1 .  TOVIAZ 4 MG TB24 tablet, Take 4 mg by mouth every evening., Disp: , Rfl: 5:  No Known Allergies:  Family History  Problem Relation Age of Onset  . CAD Neg Hx        no known FHx of early CAD  :  Social History   Socioeconomic History  . Marital status:  Married    Spouse name: Not on file  . Number of children: Not on file  . Years of education: Not on file  . Highest education level: Not on file  Occupational History  . Occupation: disability  Social Needs  . Financial resource strain: Not on file  . Food insecurity:    Worry: Not on file    Inability: Not on file  . Transportation needs:    Medical: Not on file    Non-medical: Not on file  Tobacco Use  . Smoking status: Former Smoker    Packs/day: 1.00  Years: 50.00    Pack years: 50.00    Types: Cigarettes    Last attempt to quit: 01/09/2008    Years since quitting: 9.2  . Smokeless tobacco: Never Used  . Tobacco comment: rolled own cigarettes since age of 70,   Substance and Sexual Activity  . Alcohol use: No  . Drug use: No  . Sexual activity: Not on file  Lifestyle  . Physical activity:    Days per week: Not on file    Minutes per session: Not on file  . Stress: Not on file  Relationships  . Social connections:    Talks on phone: Not on file    Gets together: Not on file    Attends religious service: Not on file    Active member of club or organization: Not on file    Attends meetings of clubs or organizations: Not on file    Relationship status: Not on file  . Intimate partner violence:    Fear of current or ex partner: Not on file    Emotionally abused: Not on file    Physically abused: Not on file    Forced sexual activity: Not on file  Other Topics Concern  . Not on file  Social History Narrative   Married, 4 children.   Disability secondary to lung disease.    No known family history of premature CAD.  :  Pertinent items are noted in HPI.  Exam: Blood pressure 134/69, pulse 92, temperature 98.8 F (37.1 C), temperature source Oral, resp. rate 16, height 5\' 4"  (1.626 m), weight 152 lb 9.6 oz (69.2 kg), SpO2 97 %.   General appearance: alert and cooperative appeared without distress. Head: atraumatic without any abnormalities. Eyes:  conjunctivae/corneas clear. PERRL.  Sclera anicteric. Throat: lips, mucosa, and tongue normal; without oral thrush or ulcers. Resp: clear to auscultation bilaterally without rhonchi, wheezes or dullness to percussion. Cardio: regular rate and rhythm, S1, S2 normal, no murmur, click, rub or gallop GI: soft, non-tender; bowel sounds normal; no masses,  no organomegaly Skin: Skin color, texture, turgor normal. No rashes or lesions Lymph nodes: Cervical, supraclavicular, and axillary nodes normal. Neurologic: Grossly normal without any motor, sensory or deep tendon reflexes. Musculoskeletal: No joint deformity or effusion.  CBC    Component Value Date/Time   WBC 16.3 (H) 03/16/2017 0946   RBC 4.73 03/16/2017 0946   HGB 13.6 03/16/2017 0946   HCT 41.6 03/16/2017 0946   PLT 323 03/16/2017 0946   MCV 87.9 03/16/2017 0946   MCH 28.8 03/16/2017 0946   MCHC 32.7 03/16/2017 0946   RDW 14.6 03/16/2017 0946   LYMPHSABS 0.5 (L) 08/05/2016 0255   MONOABS 0.9 08/05/2016 0255   EOSABS 0.0 08/05/2016 0255   BASOSABS 0.0 08/05/2016 0255     Chemistry      Component Value Date/Time   NA 135 03/16/2017 0946   K 5.0 03/16/2017 0946   CL 102 03/16/2017 0946   CO2 25 03/16/2017 0946   BUN 28 (H) 03/16/2017 0946   CREATININE 1.91 (H) 03/16/2017 0946   CREATININE 0.75 04/08/2015 1049      Component Value Date/Time   CALCIUM 7.9 (L) 03/16/2017 0946   ALKPHOS 100 08/05/2016 0255   AST 21 08/05/2016 0255   ALT 44 08/05/2016 0255   BILITOT 0.4 08/05/2016 0255       Assessment and Plan:   72 year old gentleman with the following issues:  1.  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  He had multiple tumors detected with muscle invasion and is documented by TURBT.  No evidence of metastatic disease noted on abdominal imaging.  The natural course of this disease was reviewed today and options of therapy were discussed.  Radical cystectomy considered  the gold standard although bladder conserving approach would be a reasonable option given the fact that he is a marginal candidate for cystectomy.  Definitive therapy with radiation with the role of radiosensitizing chemotherapy was discussed.  Complication associated with this form of chemotherapy which include nausea, fatigue, myelosuppression as well as renal insufficiency.  He will receive weekly carboplatin for 6 weeks to coincide with radiation.  After discussion today, he is agreeable to proceed with chemotherapy with radiation after attending chemotherapy education class.  We will make the appropriate referral for radiation oncology.  2.  IV access: Peripheral veins will be considered versus a Port-A-Cath insertion.  If this becomes an issue, Port-A-Cath will be considered.  3.  Renal function: His creatinine is 1.9 and carboplatin will be adjusted based on lab.  4.  Antiemetics: Prescription for Compazine will be made available to the patient.  5.  Follow-up: We will be in the next few weeks to start weekly carboplatin.  60  minutes was spent with the patient face-to-face today.  More than 50% of time was dedicated to patient counseling, education and coordination of his care.

## 2017-04-05 NOTE — Telephone Encounter (Signed)
Appointments scheduled AVS/Calendar printed per 3/29 los °

## 2017-04-08 ENCOUNTER — Inpatient Hospital Stay: Payer: Medicare HMO | Attending: Oncology

## 2017-04-08 DIAGNOSIS — C672 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: Secondary | ICD-10-CM | POA: Insufficient documentation

## 2017-04-08 DIAGNOSIS — Z5111 Encounter for antineoplastic chemotherapy: Secondary | ICD-10-CM | POA: Insufficient documentation

## 2017-04-08 NOTE — Progress Notes (Signed)
GU Location of Tumor / Histology: Malignant neoplasms of overlapping sites of bladder.  High grade urothelial carcinoma with muscle involvement around the left trigone as well as the right ureteral orifice.  [name] presented with recurrent superficial bladder tumors  CT abd/pelvis 2/20149: no evidence of metastatic disease. Cystoscopy 03/15/2017: multiple necrotic tumors of at least 4 with the largest around 5cm that were resected completely, hydronephrosis was also noted that is mild without any stenting placed.  Biopsies of the bladder 03/15/2017   Past/Anticipated interventions by urology, if any:  He has had Bacillus Calmette-Guerin (BCG) in the past receiving 3/6 treatments.  03/15/2017 Dr. [name]: Transurethral resection of bladder tumor.  10/19/2016 Dr. [name]: Transurethral resection of bladder tumor.  09/07/2016 Dr. [name]:  Transurethral resection of bladder tumor.  Past/Anticipated interventions by medical oncology, if any:  Dr. [name] 04/05/2017 1.  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  He had multiple tumors detected with muscle invasion and is documented by TURBT.  No evidence of metastatic disease noted on abdominal imaging.  The natural course of this disease was reviewed today and options of therapy were discussed.  Radical cystectomy considered the gold standard although bladder conserving approach would be a reasonable option given the fact that he is a marginal candidate for cystectomy.  Definitive therapy with radiation with the role of radiosensitizing chemotherapy was discussed.  He will receive weekly carboplatin for 6 weeks to coincide with radiation.  After discussion today, he is agreeable to proceed with chemotherapy with radiation after attending chemotherapy education class.  We will make the appropriate referral for radiation oncology.  5.  Follow-up: We will be in the next few weeks to start weekly  carboplatin   Weight changes, if any: No  Bowel/Bladder complaints, if any: Burning with urination  Nausea/Vomiting, if any: No   Pain issues, if any: lower back, headache, takes ibuprofen  BP (!) 148/70 (BP Location: Left Arm, Patient Position: Sitting, Cuff Size: Normal)   Pulse 87   Temp 98.5 F (36.9 C) (Oral)   Resp 16   Ht 5\' 4"  (1.626 m)   Wt 152 lb 6.4 oz (69.1 kg)   SpO2 96%   BMI 26.16 kg/m    Wt Readings from Last 3 Encounters:  04/09/17 152 lb 6.4 oz (69.1 kg)  04/05/17 152 lb 9.6 oz (69.2 kg)  03/15/17 151 lb 9.6 oz (68.8 kg)    SAFETY ISSUES:  Prior radiation? No  Pacemaker/ICD? No  Possible current pregnancy? No  Is the patient on methotrexate? No  Current Complaints / other details:  History of recurrent superficial bladder tumors.  Walks with a cane.

## 2017-04-09 ENCOUNTER — Other Ambulatory Visit: Payer: Self-pay

## 2017-04-09 ENCOUNTER — Ambulatory Visit
Admission: RE | Admit: 2017-04-09 | Discharge: 2017-04-09 | Disposition: A | Discharge status: Home / Self Care | Payer: Medicare HMO | Source: Ambulatory Visit | Attending: Radiation Oncology | Admitting: Radiation Oncology

## 2017-04-09 ENCOUNTER — Encounter: Payer: Self-pay | Admitting: Radiation Oncology

## 2017-04-09 VITALS — BP 148/70 | HR 87 | Temp 98.5°F | Resp 16 | Ht 64.0 in | Wt 152.4 lb

## 2017-04-09 DIAGNOSIS — Z510 Encounter for antineoplastic radiation therapy: Secondary | ICD-10-CM | POA: Insufficient documentation

## 2017-04-09 DIAGNOSIS — Z87891 Personal history of nicotine dependence: Secondary | ICD-10-CM | POA: Diagnosis not present

## 2017-04-09 DIAGNOSIS — Z791 Long term (current) use of non-steroidal anti-inflammatories (NSAID): Secondary | ICD-10-CM | POA: Diagnosis not present

## 2017-04-09 DIAGNOSIS — Z7982 Long term (current) use of aspirin: Secondary | ICD-10-CM | POA: Insufficient documentation

## 2017-04-09 DIAGNOSIS — Z79899 Other long term (current) drug therapy: Secondary | ICD-10-CM | POA: Insufficient documentation

## 2017-04-09 DIAGNOSIS — Z8249 Family history of ischemic heart disease and other diseases of the circulatory system: Secondary | ICD-10-CM | POA: Diagnosis not present

## 2017-04-09 DIAGNOSIS — J449 Chronic obstructive pulmonary disease, unspecified: Secondary | ICD-10-CM | POA: Insufficient documentation

## 2017-04-09 DIAGNOSIS — C679 Malignant neoplasm of bladder, unspecified: Secondary | ICD-10-CM | POA: Diagnosis not present

## 2017-04-09 DIAGNOSIS — I10 Essential (primary) hypertension: Secondary | ICD-10-CM | POA: Diagnosis not present

## 2017-04-09 DIAGNOSIS — K219 Gastro-esophageal reflux disease without esophagitis: Secondary | ICD-10-CM | POA: Insufficient documentation

## 2017-04-09 DIAGNOSIS — Z9889 Other specified postprocedural states: Secondary | ICD-10-CM | POA: Diagnosis not present

## 2017-04-09 DIAGNOSIS — I252 Old myocardial infarction: Secondary | ICD-10-CM | POA: Diagnosis not present

## 2017-04-09 DIAGNOSIS — C678 Malignant neoplasm of overlapping sites of bladder: Secondary | ICD-10-CM | POA: Insufficient documentation

## 2017-04-09 DIAGNOSIS — I2510 Atherosclerotic heart disease of native coronary artery without angina pectoris: Secondary | ICD-10-CM | POA: Diagnosis not present

## 2017-04-09 DIAGNOSIS — C672 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: Secondary | ICD-10-CM | POA: Insufficient documentation

## 2017-04-09 NOTE — Progress Notes (Addendum)
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     Attestation Please see the note from Shona Simpson, PA-C from today's visit for more details of today's encounter.  I have personally performed a face to face diagnostic evaluation on this patient and devised the following assessment and plan.  The patient was seen today regarding his diagnosis of muscle invasive bladder cancer.  The patient recently underwent cystoscopy which showed multiple necrotic tumors with the largest measuring 5 cm.  These were resected completely at that time.  The patient has seen medical oncology and is felt to be a good candidate for chemoradiation treatment at this point for definitive treatment.  He is aware of his options in terms of treatment and the patient is interested in proceeding with this.  I therefore discussed proceeding with a potential approximate 6-week course of treatment.  We discussed the rationale of treatment and all of his questions were answered.  He will undergo simulation in the near future for treatment planning.  Staging -T2 N0 M0 bladder cancer   Kyung Rudd, MD     HISTORY OF PRESENT ILLNESS: [name] is a 72 y.o. male seen at the request of Dr. [name] for a history of bladder cancer.  He was diagnosed in August 2018 with a high grade urothelial carcinoma with necrosis without musularis propria involvement and was started on BCG treatment. He was only able to complete 3 of the 6 treatments due to BCG prostatitis. He had a normal appearing CT of the abdomen and pelvis on 08/05/16, and some pulmonary issues with LLL consolidation and bilateral effusions. He had  multivessel coronary disease as well. He returned for evaluation on 10/19/16 for repeat TURBT and this revealed focally invasive high grade urothelial carcinoma invading the lamina propria but not extending into the muscularis. He returned for evaluation in March 2019 with increasing hematuria and underwent TURBT on 03/15/17 revealing at least 4 tumors that were about 5 cm in diameter and involved the muscularis propria along the left trigone and right ureteral orifice. He was not felt to be an appropriate surgical candidate due to cardiopulmonary disease. He was referred to medical oncology and met with  Dr. [name] on 04/05/2017 and was offered concurrent chemoRT. The patient presents with his daughter and an interpreter to discuss the options of radiotherapy. He is scheduled for chemotherapy on 04/18/17.  PREVIOUS RADIATION THERAPY: No   PAST MEDICAL HISTORY:  Past Medical History:  Diagnosis Date  . Allergic rhinitis   . CAD (coronary artery disease), native coronary artery cardiologist-  [name]   a. thought due to vasospasm . cath - CAD LAD 40-50%, LCX 40%, RCA 25% EF 65%. lexiscan myoview EF 68% w/o evidence of ischemia or infarct b. STEMI s/p BMS-mid RCA c. BMS x 2- prox RCA, PLOM  . Centriacinar emphysema ([place])   . COPD, group C, by GOLD 2017 classification [facility]) pulmonologist-- dr mcquid   former smoker--- severe OSA w/ hx multiple hospital admissions w/ exacerbation's  . Dyspnea   . Dysuria   . Full dentures   . GERD (gastroesophageal reflux disease)   . History  of non-ST elevation myocardial infarction (NSTEMI) 06/19/2009   in setting COPD w/ exacerbation;  negative myoview for ischemia;  previously had cardiac cath 06-08-2009 w/ diffuse nonobtructive CAD and  normal LVF  . History of prostatitis   . History of ST elevation myocardial infarction (STEMI) 02/15/2012   in setting of COPD w/ exacerbation; per cardiac cath PCI and stenting to RCA  . HTN (hypertension)   . Hyperlipidemia     . Lower abdominal pain   . Recurrent malignant neoplasm of bladder [facility]) urologist-  dr [name]   previously dx 08/ 2018 and 10/ 2018 w/ high grade urothelial carcinoma with no muscle involvement  . Type 2 diabetes mellitus ([place])   . Wears glasses        PAST SURGICAL HISTORY: Past Surgical History:  Procedure Laterality Date  . CARDIAC CATHETERIZATION N/A 12/27/2014   Procedure: Left Heart Cath and Coronary Angiography;  Surgeon: Jettie Booze, MD;  Location: [facility];  Service: Cardiovascular;  Laterality: N/A; ost.LAD to pLAD 40%, ost.D2 to D2 80%, mCFx 50%, mild to moderate left sided disease, patent RCA stents, normal LVF  . CARDIAC CATHETERIZATION  06-08-2009   dr cooper   diffuse non-obstructive CAD , normal LVF  . CARDIOVASCULAR STRESS TEST  06-25-2012  [name]   intermediate risk nuclear stress study w/ medium-sized, mild primary reversible basal to mid inferior perfusion defect suggestive of ischemia/  normal LV function and wall motion ,ef 63%/  pt scheduled for cardiac cath  . CYSTOSCOPY W/ RETROGRADES Bilateral 09/07/2016   Procedure: CYSTOSCOPY WITH BILATERAL RETROGRADE PYELOGRAM;  Surgeon: [name], MD;  Location: [facility];  Service: Urology;  Laterality: Bilateral;  . CYSTOSCOPY W/ RETROGRADES Bilateral 03/15/2017   Procedure: CYSTOSCOPY WITH RETROGRADE PYELOGRAM;  Surgeon: [name], MD;  Location[facility];  Service: Urology;  Laterality: Bilateral;  . CYSTOSCOPY WITH RETROGRADE PYELOGRAM, URETEROSCOPY AND STENT PLACEMENT Left 10/19/2016   Procedure: CYSTOSCOPY WITH RETROGRADE PYELOGRAM, URETEROSCOPY AND STENT PLACEMENT;  Surgeon: [name], MD;  Location[facility];  Service: Urology;  Laterality: Left;  . left foot surgery     repair - and ankle   . LEFT HEART CATHETERIZATION WITH CORONARY ANGIOGRAM N/A 02/15/2012   Procedure: LEFT HEART CATHETERIZATION WITH CORONARY ANGIOGRAM;  Surgeon: Peter M [name], MD;   Location: [facility] CATH LAB;  Service: Cardiovascular;  Laterality: N/A;  single vessel obstructive disease of the mid RCA, normal LVF  . LEFT HEART CATHETERIZATION WITH CORONARY ANGIOGRAM N/A 07/01/2012   Procedure: LEFT HEART CATHETERIZATION WITH CORONARY ANGIOGRAM;  Surgeon: Peter M [name], MD;  Location: [facility] CATH LAB;  Service: Cardiovascular;  Laterality: N/A;  class III angina and positive stress test; patent mRCA stent;  severe obstructive single vessel disease to new lesion proximal RCA & PLOM branch; normal LVF  . LEFT HEART CATHETERIZATION WITH CORONARY ANGIOGRAM N/A 08/03/2013   Procedure: LEFT HEART CATHETERIZATION WITH CORONARY ANGIOGRAM;  Surgeon: Peter M [name], MD;  Location: [facility] CATH LAB;  Service: Cardiovascular;  Laterality: N/A;  nonobstuctive CAD, normal LVF, stents patent mRCA and pRCA, mild diffuse disease up to 30% of stent in the PLOM  . PERCUTANEOUS CORONARY STENT INTERVENTION (PCI-S) N/A 02/15/2012   Procedure: PERCUTANEOUS CORONARY STENT INTERVENTION (PCI-S);  Surgeon: Peter M [name], MD;  Location: [place] [facility] CATH LAB;  Service: Cardiovascular;  Laterality: N/A;   BMS x1 to midRCA  . PERCUTANEOUS CORONARY STENT INTERVENTION (PCI-S)  07/01/2012   Procedure: PERCUTANEOUS CORONARY STENT INTERVENTION (PCI-S);  Surgeon: Peter M [name], MD;  Location[facility] CATH LAB;  Service: Cardiovascular;;  BMS to pRCA; BMS to PLOM  . TRANSTHORACIC ECHOCARDIOGRAM  08/05/2016   moderate LVH, ef 43-32%, grade 1 diastolic dysfunction/ mild PR and TR, trivial pericardial effusion/   . TRANSURETHRAL RESECTION OF BLADDER TUMOR N/A 09/07/2016   Procedure: TRANSURETHRAL RESECTION OF BLADDER TUMOR (TURBT);  Surgeon: [name], MD;  [facility];  Service: Urology;  Laterality: N/A;  . TRANSURETHRAL RESECTION OF BLADDER TUMOR N/A 10/19/2016   Procedure: TRANSURETHRAL RESECTION OF BLADDER TUMOR (TURBT);  Surgeon: [name], MD;  [facility];  Service: Urology;  Laterality: N/A;  . TRANSURETHRAL  RESECTION OF BLADDER TUMOR N/A 03/15/2017   Procedure: TRANSURETHRAL RESECTION OF BLADDER TUMOR (TURBT);  Surgeon: [name], MD;  [facility];  Service: Urology;  Laterality: N/A;     FAMILY HISTORY:  Family History  Problem Relation Age of Onset  . CAD Neg Hx        no known FHx of early CAD     SOCIAL HISTORY:  reports that he quit smoking about 9 years ago. His smoking use included cigarettes. He has a 50.00 pack-year smoking history. He has never used smokeless tobacco. He reports that he does not drink alcohol or use drugs.  The patient is married and lives in [place]. He is originally from [place] and has lived in the [place] since the 1990s. 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 He is accompanied by his daughter.  ALLERGIES: Patient has no known allergies.   MEDICATIONS:  Current Outpatient Medications  Medication Sig Dispense Refill  . acetaminophen (TYLENOL) 500 MG tablet Take 500 mg by mouth every 8 (eight) hours as needed for mild pain, moderate pain, fever or headache.    . albuterol (PROVENTIL HFA) 108 (90 BASE) MCG/ACT inhaler Inhale 2 puffs into the lungs 2 (two) times daily. 1 Inhaler 1  . albuterol (PROVENTIL) (2.5 MG/3ML) 0.083% nebulizer solution Take 2.5 mg by nebulization every 6 (six) hours as needed for wheezing or shortness of breath.    [name] amLODipine (NORVASC) 5 MG tablet TAKE 1 TABLET (5 MG TOTAL) BY MOUTH DAILY. 30 tablet 9  . aspirin EC 81 MG tablet Take 81 mg by mouth daily.    [name] ibuprofen (ADVIL,MOTRIN) 200 MG tablet Take 200 mg by mouth every 8 (eight) hours as needed for fever, headache, mild pain, moderate pain or cramping.    . metFORMIN (GLUCOPHAGE) 500 MG tablet Take 500 mg by mouth 2 (two) times daily with a meal.     . nitroGLYCERIN (NITROSTAT) 0.4 MG SL tablet Place 1 tablet (0.4 mg total) under the tongue every 5 (five) minutes as needed for chest pain. 25 tablet 3  . omeprazole (PRILOSEC) 20 MG capsule  Take 1 capsule (20 mg total) by mouth 2 (two) times daily before a meal. 60 capsule 6  . prochlorperazine (COMPAZINE) 10 MG tablet Take 1 tablet (10 mg total) by mouth every 6 (six) hours as needed for nausea or vomiting. 30 tablet 0  . rosuvastatin (CRESTOR) 40 MG tablet TAKE 1 TABLET BY MOUTH EVERY DAY 90 tablet 1  . tamsulosin (FLOMAX) 0.4 MG CAPS capsule Take 2 capsules (0.8 mg total) by mouth every evening. (Patient taking differently: Take 0.4 mg by mouth every evening. ) 30 capsule 2  . tiotropium (SPIRIVA HANDIHALER) 18 MCG inhalation capsule Place 1 capsule (18 mcg total) into inhaler and  inhale daily. 30 capsule 1  . TOVIAZ 4 MG TB24 tablet Take 4 mg by mouth every evening.  5  . Vitamin D, Ergocalciferol, (DRISDOL) 50000 units CAPS capsule 1 PO ONCE A WEEK  5  . amLODipine (NORVASC) 10 MG tablet      No current facility-administered medications for this encounter.      REVIEW OF SYSTEMS: On review of systems, the patient reports that he is doing well overall. He denies any chest pain, shortness of breath, cough, fevers, chills, night sweats, unintended weight changes. He notes pain in his lower back/flank area towards the end of a urinary stream as well as some cramping in the low pelvis. He is able to empty his bladder now without flomax and reports nocturia several times each evening. He denies weak stream, intermittent stream or straining.  Pt states that he has some constipation periodically, but denies abdominal pain, nausea or vomiting. He denies any new musculoskeletal or joint aches or pains. A complete review of systems is obtained and is otherwise negative.  PHYSICAL EXAM:  Wt Readings from Last 3 Encounters:  04/09/17 152 lb 6.4 oz (69.1 kg)  04/05/17 152 lb 9.6 oz (69.2 kg)  03/15/17 151 lb 9.6 oz (68.8 kg)   Temp Readings from Last 3 Encounters:  04/09/17 98.5 F (36.9 C) (Oral)  04/05/17 98.8 F (37.1 C) (Oral)  03/16/17 98.2 F (36.8 C) (Oral)   BP Readings from  Last 3 Encounters:  04/09/17 (!) 148/70  04/05/17 134/69  03/16/17 114/61   Pulse Readings from Last 3 Encounters:  04/09/17 87  04/05/17 92  03/16/17 80   Pain Assessment Pain Score: 3 (Burning with urination)/10  In general this is a well appearing male in no acute distress. He is alert and oriented x4 and appropriate throughout the examination. HEENT reveals that the patient is normocephalic, atraumatic. EOMs are intact. PERRLA. Skin is intact without any evidence of gross lesions. Cardiovascular exam reveals a regular rate and rhythm, no clicks rubs or murmurs are auscultated. Chest is clear to auscultation bilaterally. Lymphatic assessment is performed and does not reveal any adenopathy in the cervical, supraclavicular, axillary, or inguinal chains. Abdomen has active bowel sounds in all quadrants and is intact. The abdomen is soft, non tender, non distended. Lower extremities are negative for pretibial pitting edema, deep calf tenderness, cyanosis or clubbing.   ECOG = 0  0 - Asymptomatic (Fully active, able to carry on all predisease activities without restriction)  1 - Symptomatic but completely ambulatory (Restricted in physically strenuous activity but ambulatory and able to carry out work of a light or sedentary nature. For example, light housework, office work)  2 - Symptomatic, <50% in bed during the day (Ambulatory and capable of all self care but unable to carry out any work activities. Up and about more than 50% of waking hours)  3 - Symptomatic, >50% in bed, but not bedbound (Capable of only limited self-care, confined to bed or chair 50% or more of waking hours)  4 - Bedbound (Completely disabled. Cannot carry on any self-care. Totally confined to bed or chair)  5 - Death   Eustace Pen MM, Creech RH, Tormey DC, et al. [phone]). "Toxicity and response criteria of the [facility]". [place] Oncol. 5 (6): 649-55    LABORATORY DATA:  Lab Results    Component Value Date   WBC 16.3 (H) 03/16/2017   HGB 13.6 03/16/2017   HCT 41.6 03/16/2017   MCV  87.9 03/16/2017   PLT 323 03/16/2017   Lab Results  Component Value Date   NA 135 03/16/2017   K 5.0 03/16/2017   CL 102 03/16/2017   CO2 25 03/16/2017   Lab Results  Component Value Date   ALT 44 08/05/2016   AST 21 08/05/2016   ALKPHOS 100 08/05/2016   BILITOT 0.4 08/05/2016      RADIOGRAPHY: No results found.     IMPRESSION/PLAN: 1. Stage II, [id number] Multifocal High grade urothelial carcinoma with involvement of the muscularis propria along the left trigone and right ureteral orifice. Dr. Lisbeth Renshaw discusses the pathology findings and reviews the options of chemoRT in patients who are not well enough or willing to go through cystectomy. We discussed the risks, benefits, short, and long term effects of radiotherapy, and the patient is interested in proceeding. Dr. Lisbeth Renshaw discusses the delivery and logistics of radiotherapy and anticipates a course of 6 weeks with ongoing platin sensitization. The patient is scheduled to begin treatment next Thursday 04/18/17.  We will plan for simulation this week and anticipate starting 04/17/17. Written consent is obtained and placed in the chart, a copy was provided to the patient and the translator aided in reviewing this in detail.     The above documentation reflects my direct findings during this shared patient visit. Please see the separate note by Dr. Lisbeth Renshaw on this date for the remainder of the patient's plan of care.    Carola Rhine, PAC  This document serves as a record of services personally performed by Shona Simpson, PA-C and Kyung Rudd, MD. It was created on their behalf by Valeta Harms, a trained medical scribe. The creation of this record is based on the scribe's personal observations and the providers' statements to them. This document has been checked and approved by the attending provider.

## 2017-04-11 ENCOUNTER — Ambulatory Visit
Admission: RE | Admit: 2017-04-11 | Discharge: 2017-04-11 | Disposition: A | Discharge status: Home / Self Care | Payer: Medicare HMO | Source: Ambulatory Visit | Attending: Radiation Oncology | Admitting: Radiation Oncology

## 2017-04-11 DIAGNOSIS — C672 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: Secondary | ICD-10-CM

## 2017-04-11 DIAGNOSIS — Z510 Encounter for antineoplastic radiation therapy: Secondary | ICD-10-CM | POA: Diagnosis not present

## 2017-04-16 DIAGNOSIS — Z510 Encounter for antineoplastic radiation therapy: Secondary | ICD-10-CM | POA: Diagnosis not present

## 2017-04-16 DIAGNOSIS — C672 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: Secondary | ICD-10-CM | POA: Diagnosis not present

## 2017-04-17 ENCOUNTER — Ambulatory Visit
Admission: RE | Admit: 2017-04-17 | Discharge: 2017-04-17 | Disposition: A | Discharge status: Home / Self Care | Payer: Medicare HMO | Source: Ambulatory Visit | Attending: Radiation Oncology | Admitting: Radiation Oncology

## 2017-04-17 DIAGNOSIS — Z5111 Encounter for antineoplastic chemotherapy: Secondary | ICD-10-CM | POA: Diagnosis not present

## 2017-04-17 DIAGNOSIS — Z510 Encounter for antineoplastic radiation therapy: Secondary | ICD-10-CM | POA: Diagnosis not present

## 2017-04-17 DIAGNOSIS — C672 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: Secondary | ICD-10-CM | POA: Diagnosis not present

## 2017-04-18 ENCOUNTER — Telehealth: Payer: Self-pay | Admitting: *Deleted

## 2017-04-18 ENCOUNTER — Other Ambulatory Visit: Payer: Self-pay | Admitting: Oncology

## 2017-04-18 ENCOUNTER — Inpatient Hospital Stay: Payer: Medicare HMO

## 2017-04-18 ENCOUNTER — Encounter: Payer: Self-pay | Admitting: *Deleted

## 2017-04-18 ENCOUNTER — Ambulatory Visit
Admission: RE | Admit: 2017-04-18 | Discharge: 2017-04-18 | Disposition: A | Discharge status: Home / Self Care | Payer: Medicare HMO | Source: Ambulatory Visit | Attending: Radiation Oncology | Admitting: Radiation Oncology

## 2017-04-18 ENCOUNTER — Other Ambulatory Visit: Payer: Self-pay | Admitting: *Deleted

## 2017-04-18 VITALS — BP 134/99 | HR 84 | Temp 98.3°F | Resp 17 | Ht 64.0 in | Wt 145.5 lb

## 2017-04-18 DIAGNOSIS — Z5111 Encounter for antineoplastic chemotherapy: Secondary | ICD-10-CM | POA: Diagnosis not present

## 2017-04-18 DIAGNOSIS — C679 Malignant neoplasm of bladder, unspecified: Secondary | ICD-10-CM

## 2017-04-18 DIAGNOSIS — Z510 Encounter for antineoplastic radiation therapy: Secondary | ICD-10-CM | POA: Diagnosis not present

## 2017-04-18 DIAGNOSIS — C672 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: Secondary | ICD-10-CM | POA: Diagnosis not present

## 2017-04-18 DIAGNOSIS — C678 Malignant neoplasm of overlapping sites of bladder: Secondary | ICD-10-CM

## 2017-04-18 LAB — CMP (CANCER CENTER ONLY)
ALBUMIN: 3.5 g/dL (ref 3.5–5.0)
ALT: 44 U/L (ref 0–55)
AST: 18 U/L (ref 5–34)
Alkaline Phosphatase: 76 U/L (ref 40–150)
Anion gap: 10 (ref 3–11)
BILIRUBIN TOTAL: 0.3 mg/dL (ref 0.2–1.2)
BUN: 15 mg/dL (ref 7–26)
CO2: 27 mmol/L (ref 22–29)
Calcium: 9.3 mg/dL (ref 8.4–10.4)
Chloride: 104 mmol/L (ref 98–109)
Creatinine: 0.99 mg/dL (ref 0.70–1.30)
GFR, Est AFR Am: >60 mL/min (ref >60)
GFR, Estimated: >60 mL/min (ref >60)
GLUCOSE: 198 mg/dL — AB (ref 70–140)
POTASSIUM: 3 mmol/L — AB (ref 3.5–5.1)
Sodium: 141 mmol/L (ref 136–145)
TOTAL PROTEIN: 6.7 g/dL (ref 6.4–8.3)

## 2017-04-18 LAB — CBC WITH DIFFERENTIAL (CANCER CENTER ONLY)
BASOS ABS: 0 10*3/uL (ref 0.0–0.1)
BASOS PCT: 0 %
Eosinophils Absolute: 0.2 10*3/uL (ref 0.0–0.5)
Eosinophils Relative: 2 %
HEMATOCRIT: 43.3 % (ref 38.4–49.9)
Hemoglobin: 14.4 g/dL (ref 13.0–17.1)
Lymphocytes Relative: 16 %
Lymphs Abs: 1.9 10*3/uL (ref 0.9–3.3)
MCH: 27.8 pg (ref 27.2–33.4)
MCHC: 33.3 g/dL (ref 32.0–36.0)
MCV: 83.6 fL (ref 79.3–98.0)
MONO ABS: 0.9 10*3/uL (ref 0.1–0.9)
Monocytes Relative: 7 %
NEUTROS ABS: 9.2 10*3/uL — AB (ref 1.5–6.5)
Neutrophils Relative %: 75 %
Platelet Count: 233 10*3/uL (ref 140–400)
RBC: 5.18 MIL/uL (ref 4.20–5.82)
RDW: 14.4 % (ref 11.0–14.6)
WBC: 12.2 10*3/uL — AB (ref 4.0–10.3)

## 2017-04-18 MED ORDER — POTASSIUM CHLORIDE CRYS ER 20 MEQ PO TBCR: 20.0000 meq | EXTENDED_RELEASE_TABLET | Freq: Every day | ORAL | 0 refills | Status: DC

## 2017-04-18 MED ORDER — SODIUM CHLORIDE 0.9 % IV SOLN
180.8000 mg | Freq: Once | INTRAVENOUS | Status: AC
  Administered 2017-04-18: 180 mg via INTRAVENOUS
  Filled 2017-04-18: qty 18

## 2017-04-18 MED ORDER — SODIUM CHLORIDE 0.9 % IV SOLN
Freq: Once | INTRAVENOUS | Status: AC
  Administered 2017-04-18: 09:00:00 via INTRAVENOUS

## 2017-04-18 MED ORDER — PALONOSETRON HCL INJECTION 0.25 MG/5ML
INTRAVENOUS | Status: AC
  Filled 2017-04-18: qty 5

## 2017-04-18 MED ORDER — DEXAMETHASONE SODIUM PHOSPHATE 10 MG/ML IJ SOLN
INTRAMUSCULAR | Status: AC
  Filled 2017-04-18: qty 1

## 2017-04-18 MED ORDER — PALONOSETRON HCL INJECTION 0.25 MG/5ML
0.2500 mg | Freq: Once | INTRAVENOUS | Status: AC
  Administered 2017-04-18: 0.25 mg via INTRAVENOUS

## 2017-04-18 MED ORDER — DEXAMETHASONE SODIUM PHOSPHATE 10 MG/ML IJ SOLN
10.0000 mg | Freq: Once | INTRAMUSCULAR | Status: AC
  Administered 2017-04-18: 10 mg via INTRAVENOUS

## 2017-04-18 NOTE — Patient Instructions (Addendum)
[facility] Discharge Instructions for Patients Receiving Chemotherapy  Today you received the following chemotherapy agents: Carboplatin (Paraplatin).  To help prevent nausea and vomiting after your treatment, we encourage you to take your nausea medication as prescribed. Received Aloxi during treatment today-->Take Compazine (not Zofran) for the next 3 days as needed.  If you develop nausea and vomiting that is not controlled by your nausea medication, call the clinic.   BELOW ARE SYMPTOMS THAT SHOULD BE REPORTED IMMEDIATELY:  *FEVER GREATER THAN 100.5 F  *CHILLS WITH OR WITHOUT FEVER  NAUSEA AND VOMITING THAT IS NOT CONTROLLED WITH YOUR NAUSEA MEDICATION  *UNUSUAL SHORTNESS OF BREATH  *UNUSUAL BRUISING OR BLEEDING  TENDERNESS IN MOUTH AND THROAT WITH OR WITHOUT PRESENCE OF ULCERS  *URINARY PROBLEMS  *BOWEL PROBLEMS  UNUSUAL RASH Items with * indicate a potential emergency and should be followed up as soon as possible.  Feel free to call the clinic should you have any questions or concerns. The clinic phone number is [phone].  Please show the [facility] at check-in to the Emergency Department and triage nurse.  Carboplatin injection ?y l thu?c g? CARBOPLATIN l thu?c ha tr? li?u. N nh?m ??n cc t? bo phn chia nhanh, nh? cc t? bo ung th?, v lm cho cc t? bo ? ch?t. Thu?c ny c?ng ???c dng ?? ?i?u tr? ung th? bu?ng tr?ng v nhi?u b?nh ung th? khc. Thu?c ny c th? ???c dng cho nh?ng m?c ?ch khc; hy h?i ng??i cung c?p d?ch v? y t? ho?c d??c s? c?a mnh, n?u qu v? c th?c m?c. (CC) NHN HI?U PH? BI?N: Paraplatin Ti c?n ph?i bo cho ng??i cung c?p d?ch v? y t? c?a mnh ?i?u g tr??c khi dng thu?c ny? H? c?n bi?t li?u qu v? hi?n c b?t k? tnh tr?ng no sau ?y hay khng: -cc r?i lo?n v? mu -cc v?n ?? v? thnh l?c -b?nh th?n -m?i x? tr? g?n ?y ho?c ?ang x? tr? -pha?n ??ng b?t th???ng ho??c di? ??ng v??i carboplatin,  cisplatin, ho?c cc thu?c ha tr? li?u khc -pha?n ??ng b?t th???ng ho??c di? ??ng v??i ca?c d??c ph?m kha?c, th?c ph?m, thu?c nhu?m, ho??c ch?t ba?o qua?n -?ang c thai ho??c ??nh co? thai -?ang cho con bu? Ti nn s? d?ng thu?c ny nh? th? no? Thu?c ny th??ng ???c dng nh? thu?c ?? truy?n vo t?nh m?ch. Thu?c ny ???c cho trong b?nh vi?n ho?c phng m?ch b?i chuyn vin y t? ???c hu?n luy?n ??c bi?t. Hy bn v?i bc s? nhi khoa c?a qu v? v? vi?c dng thu?c ny ? tr? em. C th? c?n ch?m [place] ??c bi?t. Qu li?u: N?u qu v? cho r?ng mnh ? dng qu nhi?u thu?c ny, th hy lin l?c v?i trung tm ki?m sot ch?t ??c ho?c phng c?p c?u ngay l?p t?c. L?U : Thu?c ny ch? dnh ring cho qu v?. Khng chia s? thu?c ny v?i nh?ng ng??i khc. N?u ti l? qun m?t li?u th sao? ?i?u quan tr?ng l khng nn b? l? li?u thu?c no. Hy lin l?c v?i bc s? ho?c [place] vin y t? c?a mnh, n?u qu v? khng th? gi? ?ng cu?c h?n khm. Nh?ng g c th? t??ng tc v?i thu?c ny? -cc thu?c dng cho cc ch?ng co gi?t (kinh phong) -m?t s? thu?c lm t?ng s? l??ng t? bo mu, ch?ng h?n nh? filgrastim, pegfilgrastim, sargramostim -m?t s? thu?c khng sinh, ch?ng h?n nh? amikacin, gentamicin, neomycin, streptomycin, tobramycin -cc thu?c  ch?ng ng?a Hy bo cho bc s? ho?c chuyn vin y t? c?a mnh tr??c khi dng b?t k? thu?c no trong s? nh?ng thu?c ny: -acetaminophen -aspirin -ibuprofen -ketoprofen -naproxen Danh sch ny c th? khng m t? ?? h?t cc t??ng tc c th? x?y ra. Hy ??a cho ng??i cung c?p d?ch v? y t? c?a mnh danh sch t?t c? cc thu?c, th?o d??c, cc thu?c khng c?n toa, ho?c cc ch? ph?m b? sung m qu v? dng. C?ng nn bo cho h? bi?t r?ng qu v? c ht thu?c, u?ng r??u, ho?c c s? d?ng ma ty tri php hay khng. Vi th? c th? t??ng tc v?i thu?c c?a qu v?. Ti c?n ph?i theo di ?i?u g trong khi dng thu?c ny? Qu v? s? ???c theo di ch?t ch? trong khi dng thu?c ny. Qu v? s? c?n ph?i ?i  lm cc xt nghi?m mu quan tr?ng trong th?i gian dng thu?c ny. Thu?c ny c th? lm cho qu v? c?m th?y khng ???c kh?e nh? th??ng l?. ?i?u ny khng ph?i khng ph? bi?n, b?i v thu?c ha tr? li?u c th? ?nh h??ng ??n c? t? bo lnh l?n t? bo ung th?. Hy t??ng trnh m?i tc d?ng ph?. Hy ti?p t?c ??t ?i?u tr? c?a mnh ngay c? khi qu v? c?m th?y m?t, tr? khi bc s? yu c?u qu v? ng?ng ?i?u tr?[name] m?t s? tr??ng h?p, qu v? c th? ???c cho dng cc thu?c ph? thm ?? gip gi?m tc d?ng ph?. Hy lm theo t?t c? cc h??ng d?n v? vi?c s? d?ng chng. Hy h?i  ki?n bc s? ho?c chuyn vin y t?, n?u qu v? b? s?t, ?n l?nh ho?c ?au h?ng, ho?c c cc tri?u ch?ng khc c?a c?m l?nh ho?c cm. Khng ???c t? ?i?u tr? cho mnh. Thu?c ny lm gi?m kh? n?ng ch?ng l?i cc b?nh nhi?m trng c?a c? th?. Hy c? trnh ? g?n nh?ng ng??i b? b?nh. Thu?c ny c th? lm t?ng nguy c? b? b?m tm ho?c ch?y mu. Hy lin l?c v?i bc s? ho?c chuyn vin y t?, n?u qu v? th?y ch?y mu b?t th??ng. Hy c?n th?n khi ?nh r?ng ho?c x?a r?ng b?ng ch? nha khoa ho?c b?ng t?m, b?i v qu v? c th? d? b? nhi?m trng ho?c d? b? ch?y mu h?n. N?u qu v? c ?i lm r?ng, th hy bo v?i nha s? r?ng qu v? ?ang dng thu?c ny. Trnh dng cc thu?c c ch?a aspirin, acetaminophen, ibuprofen, naproxen, ho?c ketoprofen, tr? khi ? ???c bc s? ch? d?n. Cc thu?c ny c th? che l?p tri?u ch?ng s?t. Khng ???c ?? c thai trong khi dng thu?c ny. Ph? n? c?n ph?i thng bo cho bc s? c?a mnh, n?u mu?n c thai ho?c ngh? r?ng c th? mnh ? c [place]. C nguy c? v? cc tc d?ng ph? nghim tr?ng ??i v?i [place] nhi. Hy th?o lu?n v?i bc s? ho?c chuyn vin y t? ho?c d??c s? ?? bi?t thm thng tin. Khng ???c nui con b?ng s?a m? trong khi dng thu?c ny. Ti c th? nh?n th?y nh?ng tc d?ng ph? no khi dng thu?c ny? Nh?ng tc d?ng ph? qu v? c?n ph?i bo cho bc s? ho?c chuyn vin y t? cng s?m cng t?t: -cc ph?n ?ng d? ?ng, ch?ng h?n nh? da b? m?n ??,  ng?a, n?i my ?ay, s?ng ? m?t, mi, ho?c l??i -cc d?u hi?u nhi?m trng - s?t ho?c ?n l?nh,  ho, ?au h?ng, kh ?i ti?u ho?c ?i ti?u ?au -cc d?u hi?u gi?m ti?u c?u ho?c xu?t huy?t - b?m tm, cc n?t l?m t?m ?? trn da, phn c mu ?en, mu h?c n, ch?y mu cam -cc d?u hi?u gi?m s? l??ng t? bo h?ng c?u - c?m th?y y?u ?t ho?c m?t m?i m?t cch b?t th??ng, cc c?n ng?t x?u, chong vng -cc v?n ?? v? h h?p -thay ??i thnh l?c -thay ??i th? l?c -?au ng?c -huy?t a?p cao -gi?m s? l??ng t? bo mu - thu?c ny c th? lm gi?m s? l??ng t? bo b?ch c?u, t? bo h?ng c?u v ti?u c?u. Qu v? c th? c nguy c? cao b? nhi?m trng v xu?t huy?t. -bu?n i ho?c i m?a -?au, s?ng, ??, ho?c kch ?ng ? ch? tim -?au, t ho?c c?m gic nh? b? ki?n b ? bn tay ho?c bn chn -cc v?n ?? v? gi? th?ng b?ng, ni n?ng, ?i l?i -kh ?i ti?u ho?c thay ??i l??ng n??c ti?u ???c bi ti?t Cc tc d?ng ph? khng c?n ph?i ch?m [place] y t? (hy bo cho bc s? ho?c chuyn vin y t?, n?u cc tc d?ng ph? ny ti?p di?n ho?c gy phi?n toi): -r?ng tc -m?t c?m gic ngon mi?ng -thay ??i v? gic, ho?c c v? kim lo?i trong mi?ng Danh sch ny c th? khng m t? ?? h?t cc tc d?ng ph? c th? x?y ra. Xin g?i t?i bc s? c?a mnh ?? ???c c? v?n chuyn mn v? cc tc d?ng ph?Sander Nephew v? c th? t??ng trnh cc tc d?ng ph? cho FDA theo s? [phone]. Ti nn c?t gi? thu?c c?a mnh ? ?u? Thu?c ny ???c s? d?ng b?i chuyn vin y t? ? b?nh vi?n ho?c ? phng m?ch. Qu v? s? khng ???c c?p thu?c ny ?? c?t gi? t?i nh. L?U : ?y l b?n tm t?t. N c th? khng bao hm t?t c? thng tin c th? c. N?u qu v? th?c m?c v? thu?c ny, xin trao ??i v?i bc s?, d??c s?, ho?c ng??i cung c?p d?ch v? y t? c?a mnh.  2018 Elsevier/Gold Standard (2008-11-26 00:00:00)

## 2017-04-18 NOTE — Telephone Encounter (Signed)
Spoke with patient and family member. k-dur 20 meq e-scribed to [employer]. Patient to take 1 tablet daily for 14 days.

## 2017-04-18 NOTE — Telephone Encounter (Signed)
-----   Message from [name], MD sent at 04/18/2017  9:21 AM EDT ----- Regarding: FW: Low Potassium Can you RX Kdur 20 meq daily for 14 days.  Thanks,  FS ----- Message ----- From: [name], RN Sent: 04/18/2017   9:09 AM To: [name], MD Subject: Low Potassium                                  Just an FYI, pt's potassium is 3.0 today. Pt uses [facility] on [address] if you want to send prescription in. Thank you

## 2017-04-19 ENCOUNTER — Telehealth: Payer: Self-pay | Admitting: *Deleted

## 2017-04-19 ENCOUNTER — Ambulatory Visit
Admission: RE | Admit: 2017-04-19 | Discharge: 2017-04-19 | Disposition: A | Discharge status: Home / Self Care | Payer: Medicare HMO | Source: Ambulatory Visit | Attending: Radiation Oncology | Admitting: Radiation Oncology

## 2017-04-19 DIAGNOSIS — C672 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: Secondary | ICD-10-CM | POA: Diagnosis not present

## 2017-04-19 DIAGNOSIS — Z510 Encounter for antineoplastic radiation therapy: Secondary | ICD-10-CM | POA: Diagnosis not present

## 2017-04-19 NOTE — Telephone Encounter (Signed)
-----   Message from [name], RN sent at 04/18/2017 10:35 AM EDT ----- Regarding: Dr. [name]; First time F/U Call Pt received first time Carboplatin. Tolerated well. Speaks mainly [name] but knows some [name]. Thank you!

## 2017-04-19 NOTE — Progress Notes (Signed)
  Radiation Oncology         [phone] ________________________________  Name: [name] MRN: [id number]  Date: 04/11/2017  DOB: 12-Apr-1945  SIMULATION AND TREATMENT PLANNING NOTE  DIAGNOSIS:     ICD-10-CM   1. 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  Identity was confirmed.  All relevant records and images related to the planned course of therapy were reviewed.   Written consent to proceed with treatment was confirmed which was freely given after reviewing the details related to the planned course of therapy had been reviewed with the patient.  Then, the patient was set-up in a stable reproducible  supine position for radiation therapy.  CT images were obtained.  Surface markings were placed.    Medically necessary complex treatment device(s) for immobilization: Customized Vac-Lok bag .   The CT images were loaded into the planning software.  Then the target and avoidance structures were contoured.  Treatment planning then occurred.  The radiation prescription was entered and confirmed.  A total of 4 complex treatment devices were fabricated which relate to the designed radiation treatment fields. Each of these customized fields/ complex treatment devices will be used on a daily basis during the radiation course. I have requested : 3D Simulation  I have requested a DVH of the following structures: Target volume, bladder, rectum, femoral heads bilaterally.   The patient will undergo daily image guidance to ensure accurate localization of the target, and adequate minimize dose to the normal surrounding structures in close proximity to the target.   PLAN:  The patient will receive 44 Gy in 22 fractions initially.  The patient then will receive a 14 Gy boost to yield a final dose of 60 Gy.   Special treatment procedure The patient will also receive concurrent chemotherapy  during the treatment. The patient may therefore experience increased toxicity or side effects and the patient will be monitored for such problems. This may require extra lab work as necessary. This therefore constitutes a special treatment procedure. .  ________________________________   Jodelle Gross, MD, PhD

## 2017-04-19 NOTE — Telephone Encounter (Signed)
Attempted to call patient x 2 for follow up after first chemotherapy. Unable to reach.

## 2017-04-22 ENCOUNTER — Ambulatory Visit
Admission: RE | Admit: 2017-04-22 | Discharge: 2017-04-22 | Disposition: A | Discharge status: Home / Self Care | Payer: Medicare HMO | Source: Ambulatory Visit | Attending: Radiation Oncology | Admitting: Radiation Oncology

## 2017-04-22 ENCOUNTER — Telehealth: Payer: Self-pay | Admitting: *Deleted

## 2017-04-22 DIAGNOSIS — Z510 Encounter for antineoplastic radiation therapy: Secondary | ICD-10-CM | POA: Diagnosis not present

## 2017-04-22 DIAGNOSIS — C672 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: Secondary | ICD-10-CM | POA: Diagnosis not present

## 2017-04-22 NOTE — Progress Notes (Signed)
Pt here for patient teaching.  Pt given Radiation and You booklet.  Reviewed areas of pertinence such as diarrhea, fatigue, hair loss, nausea and vomiting, sexual and fertility changes, skin changes and urinary and bladder changes . Pt able to give teach back of drink plenty of water,avoid applying anything to skin within 4 hours of treatment. Pt verbalizes understanding of information given and will contact nursing with any questions or concerns.  Education was given with an interpreter present.  Cori Razor, RN

## 2017-04-22 NOTE — Telephone Encounter (Signed)
Called Mr. Pekar to ask if he could come in a few minutes early to receive his radiation education.  He verbalized understanding of this.  Will continue to follow as necessary.  Cori Razor, RN

## 2017-04-23 ENCOUNTER — Ambulatory Visit
Admission: RE | Admit: 2017-04-23 | Discharge: 2017-04-23 | Disposition: A | Discharge status: Home / Self Care | Payer: Medicare HMO | Source: Ambulatory Visit | Attending: Radiation Oncology | Admitting: Radiation Oncology

## 2017-04-23 DIAGNOSIS — E1165 Type 2 diabetes mellitus with hyperglycemia: Secondary | ICD-10-CM | POA: Diagnosis not present

## 2017-04-23 DIAGNOSIS — Z510 Encounter for antineoplastic radiation therapy: Secondary | ICD-10-CM | POA: Diagnosis not present

## 2017-04-23 DIAGNOSIS — C672 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: Secondary | ICD-10-CM | POA: Diagnosis not present

## 2017-04-23 DIAGNOSIS — I10 Essential (primary) hypertension: Secondary | ICD-10-CM | POA: Diagnosis not present

## 2017-04-23 DIAGNOSIS — C679 Malignant neoplasm of bladder, unspecified: Secondary | ICD-10-CM | POA: Diagnosis not present

## 2017-04-23 DIAGNOSIS — G933 Postviral fatigue syndrome: Secondary | ICD-10-CM | POA: Diagnosis not present

## 2017-04-24 ENCOUNTER — Other Ambulatory Visit: Payer: Self-pay | Admitting: Radiation Oncology

## 2017-04-24 ENCOUNTER — Ambulatory Visit
Admission: RE | Admit: 2017-04-24 | Discharge: 2017-04-24 | Disposition: A | Discharge status: Home / Self Care | Payer: Medicare HMO | Source: Ambulatory Visit | Attending: Radiation Oncology | Admitting: Radiation Oncology

## 2017-04-24 DIAGNOSIS — Z5111 Encounter for antineoplastic chemotherapy: Secondary | ICD-10-CM | POA: Diagnosis not present

## 2017-04-24 DIAGNOSIS — R300 Dysuria: Secondary | ICD-10-CM

## 2017-04-24 DIAGNOSIS — C672 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: Secondary | ICD-10-CM | POA: Diagnosis not present

## 2017-04-24 DIAGNOSIS — Z510 Encounter for antineoplastic radiation therapy: Secondary | ICD-10-CM | POA: Diagnosis not present

## 2017-04-25 ENCOUNTER — Ambulatory Visit
Admission: RE | Admit: 2017-04-25 | Discharge: 2017-04-25 | Disposition: A | Discharge status: Home / Self Care | Payer: Medicare HMO | Source: Ambulatory Visit | Attending: Radiation Oncology | Admitting: Radiation Oncology

## 2017-04-25 ENCOUNTER — Inpatient Hospital Stay: Payer: Medicare HMO

## 2017-04-25 ENCOUNTER — Telehealth: Payer: Self-pay | Admitting: *Deleted

## 2017-04-25 ENCOUNTER — Other Ambulatory Visit: Payer: Self-pay | Admitting: *Deleted

## 2017-04-25 VITALS — BP 140/82 | HR 87 | Temp 98.6°F | Resp 17

## 2017-04-25 DIAGNOSIS — C678 Malignant neoplasm of overlapping sites of bladder: Secondary | ICD-10-CM

## 2017-04-25 DIAGNOSIS — R300 Dysuria: Secondary | ICD-10-CM

## 2017-04-25 DIAGNOSIS — C672 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: Secondary | ICD-10-CM | POA: Diagnosis not present

## 2017-04-25 DIAGNOSIS — Z5111 Encounter for antineoplastic chemotherapy: Secondary | ICD-10-CM | POA: Diagnosis not present

## 2017-04-25 DIAGNOSIS — Z510 Encounter for antineoplastic radiation therapy: Secondary | ICD-10-CM | POA: Diagnosis not present

## 2017-04-25 LAB — CMP (CANCER CENTER ONLY)
ALT: 36 U/L (ref 0–55)
AST: 22 U/L (ref 5–34)
Albumin: 3.5 g/dL (ref 3.5–5.0)
Alkaline Phosphatase: 66 U/L (ref 40–150)
Anion gap: 7 (ref 3–11)
BILIRUBIN TOTAL: 0.3 mg/dL (ref 0.2–1.2)
BUN: 17 mg/dL (ref 7–26)
CALCIUM: 9.3 mg/dL (ref 8.4–10.4)
CHLORIDE: 104 mmol/L (ref 98–109)
CO2: 28 mmol/L (ref 22–29)
CREATININE: 0.9 mg/dL (ref 0.70–1.30)
Glucose, Bld: 193 mg/dL — ABNORMAL HIGH (ref 70–140)
Potassium: 4 mmol/L (ref 3.5–5.1)
Sodium: 139 mmol/L (ref 136–145)
TOTAL PROTEIN: 6.7 g/dL (ref 6.4–8.3)

## 2017-04-25 LAB — URINALYSIS, COMPLETE (UACMP) WITH MICROSCOPIC
Bilirubin Urine: NEGATIVE
Glucose, UA: NEGATIVE mg/dL
Ketones, ur: NEGATIVE mg/dL
NITRITE: NEGATIVE
PROTEIN: NEGATIVE mg/dL
SPECIFIC GRAVITY, URINE: 1.012 (ref 1.005–1.030)
Squamous Epithelial / LPF: NONE SEEN
pH: 8 (ref 5.0–8.0)

## 2017-04-25 LAB — CBC WITH DIFFERENTIAL (CANCER CENTER ONLY)
BASOS ABS: 0 10*3/uL (ref 0.0–0.1)
Basophils Relative: 0 %
EOS ABS: 0.2 10*3/uL (ref 0.0–0.5)
Eosinophils Relative: 3 %
HCT: 38.7 % (ref 38.4–49.9)
HEMOGLOBIN: 12.8 g/dL — AB (ref 13.0–17.1)
LYMPHS ABS: 0.8 10*3/uL — AB (ref 0.9–3.3)
Lymphocytes Relative: 13 %
MCH: 27.7 pg (ref 27.2–33.4)
MCHC: 33.1 g/dL (ref 32.0–36.0)
MCV: 83.7 fL (ref 79.3–98.0)
Monocytes Absolute: 0.6 10*3/uL (ref 0.1–0.9)
Monocytes Relative: 9 %
NEUTROS PCT: 75 %
Neutro Abs: 4.8 10*3/uL (ref 1.5–6.5)
Platelet Count: 274 10*3/uL (ref 140–400)
RBC: 4.62 MIL/uL (ref 4.20–5.82)
RDW: 15.8 % — ABNORMAL HIGH (ref 11.0–14.6)
WBC: 6.4 10*3/uL (ref 4.0–10.3)

## 2017-04-25 MED ORDER — PALONOSETRON HCL INJECTION 0.25 MG/5ML
0.2500 mg | Freq: Once | INTRAVENOUS | Status: AC
  Administered 2017-04-25: 0.25 mg via INTRAVENOUS

## 2017-04-25 MED ORDER — SODIUM CHLORIDE 0.9 % IV SOLN
180.8000 mg | Freq: Once | INTRAVENOUS | Status: AC
  Administered 2017-04-25: 180 mg via INTRAVENOUS
  Filled 2017-04-25: qty 18

## 2017-04-25 MED ORDER — PALONOSETRON HCL INJECTION 0.25 MG/5ML
INTRAVENOUS | Status: AC
  Filled 2017-04-25: qty 5

## 2017-04-25 MED ORDER — DEXAMETHASONE SODIUM PHOSPHATE 10 MG/ML IJ SOLN
INTRAMUSCULAR | Status: AC
  Filled 2017-04-25: qty 1

## 2017-04-25 MED ORDER — DEXAMETHASONE SODIUM PHOSPHATE 10 MG/ML IJ SOLN
10.0000 mg | Freq: Once | INTRAMUSCULAR | Status: AC
  Administered 2017-04-25: 10 mg via INTRAVENOUS

## 2017-04-25 MED ORDER — SODIUM CHLORIDE 0.9 % IV SOLN
Freq: Once | INTRAVENOUS | Status: AC
  Administered 2017-04-25: 15:00:00 via INTRAVENOUS

## 2017-04-25 NOTE — Patient Instructions (Signed)
[facility] Discharge Instructions for Patients Receiving Chemotherapy  Today you received the following chemotherapy agents: Carboplatin.  To help prevent nausea and vomiting after your treatment, we encourage you to take your nausea medication as prescribed.   If you develop nausea and vomiting that is not controlled by your nausea medication, call the clinic.   BELOW ARE SYMPTOMS THAT SHOULD BE REPORTED IMMEDIATELY:  *FEVER GREATER THAN 100.5 F  *CHILLS WITH OR WITHOUT FEVER  NAUSEA AND VOMITING THAT IS NOT CONTROLLED WITH YOUR NAUSEA MEDICATION  *UNUSUAL SHORTNESS OF BREATH  *UNUSUAL BRUISING OR BLEEDING  TENDERNESS IN MOUTH AND THROAT WITH OR WITHOUT PRESENCE OF ULCERS  *URINARY PROBLEMS  *BOWEL PROBLEMS  UNUSUAL RASH Items with * indicate a potential emergency and should be followed up as soon as possible.  Feel free to call the clinic should you have any questions or concerns. The clinic phone number is [phone].  Please show the CHEMO ALERT CARD at check-in to the Emergency Department and triage nurse.     

## 2017-04-25 NOTE — Telephone Encounter (Signed)
3:03 pm: Called Mr. Capwell/ Daughter Al Corpus to inform of results of recent urinalysis.  Advised to give me a call back.    3:09 pm: Daughter called back and she was given the results and informed that he has medication that would be called in to the pharmacy.  I verified the pharmacy.  Will continue to follow as necessary.  Cori Razor, RN

## 2017-04-26 ENCOUNTER — Ambulatory Visit
Admission: RE | Admit: 2017-04-26 | Discharge: 2017-04-26 | Disposition: A | Discharge status: Home / Self Care | Payer: Medicare HMO | Source: Ambulatory Visit | Attending: Radiation Oncology | Admitting: Radiation Oncology

## 2017-04-26 DIAGNOSIS — Z510 Encounter for antineoplastic radiation therapy: Secondary | ICD-10-CM | POA: Diagnosis not present

## 2017-04-26 DIAGNOSIS — C672 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: Secondary | ICD-10-CM | POA: Diagnosis not present

## 2017-04-26 LAB — URINE CULTURE

## 2017-04-29 ENCOUNTER — Ambulatory Visit
Admission: RE | Admit: 2017-04-29 | Discharge: 2017-04-29 | Disposition: A | Discharge status: Home / Self Care | Payer: Medicare HMO | Source: Ambulatory Visit | Attending: Radiation Oncology | Admitting: Radiation Oncology

## 2017-04-29 DIAGNOSIS — C672 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: Secondary | ICD-10-CM | POA: Diagnosis not present

## 2017-04-29 DIAGNOSIS — Z510 Encounter for antineoplastic radiation therapy: Secondary | ICD-10-CM | POA: Diagnosis not present

## 2017-04-30 ENCOUNTER — Ambulatory Visit
Admission: RE | Admit: 2017-04-30 | Discharge: 2017-04-30 | Disposition: A | Discharge status: Home / Self Care | Payer: Medicare HMO | Source: Ambulatory Visit | Attending: Radiation Oncology | Admitting: Radiation Oncology

## 2017-04-30 DIAGNOSIS — Z510 Encounter for antineoplastic radiation therapy: Secondary | ICD-10-CM | POA: Diagnosis not present

## 2017-04-30 DIAGNOSIS — C672 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: Secondary | ICD-10-CM | POA: Diagnosis not present

## 2017-05-01 ENCOUNTER — Ambulatory Visit
Admission: RE | Admit: 2017-05-01 | Discharge: 2017-05-01 | Disposition: A | Discharge status: Home / Self Care | Payer: Medicare HMO | Source: Ambulatory Visit | Attending: Radiation Oncology | Admitting: Radiation Oncology

## 2017-05-01 DIAGNOSIS — Z510 Encounter for antineoplastic radiation therapy: Secondary | ICD-10-CM | POA: Diagnosis not present

## 2017-05-01 DIAGNOSIS — C672 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: Secondary | ICD-10-CM | POA: Diagnosis not present

## 2017-05-01 DIAGNOSIS — Z5111 Encounter for antineoplastic chemotherapy: Secondary | ICD-10-CM | POA: Diagnosis not present

## 2017-05-02 ENCOUNTER — Ambulatory Visit
Admission: RE | Admit: 2017-05-02 | Discharge: 2017-05-02 | Disposition: A | Discharge status: Home / Self Care | Payer: Medicare HMO | Source: Ambulatory Visit | Attending: Radiation Oncology | Admitting: Radiation Oncology

## 2017-05-02 ENCOUNTER — Inpatient Hospital Stay: Payer: Medicare HMO

## 2017-05-02 VITALS — BP 149/89 | HR 85 | Temp 98.5°F | Resp 17

## 2017-05-02 DIAGNOSIS — Z5111 Encounter for antineoplastic chemotherapy: Secondary | ICD-10-CM | POA: Diagnosis not present

## 2017-05-02 DIAGNOSIS — Z510 Encounter for antineoplastic radiation therapy: Secondary | ICD-10-CM | POA: Diagnosis not present

## 2017-05-02 DIAGNOSIS — C678 Malignant neoplasm of overlapping sites of bladder: Secondary | ICD-10-CM

## 2017-05-02 DIAGNOSIS — C672 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: Secondary | ICD-10-CM | POA: Diagnosis not present

## 2017-05-02 LAB — CBC WITH DIFFERENTIAL (CANCER CENTER ONLY)
BASOS ABS: 0 10*3/uL (ref 0.0–0.1)
Basophils Relative: 0 %
Eosinophils Absolute: 0.2 10*3/uL (ref 0.0–0.5)
Eosinophils Relative: 3 %
HCT: 40.4 % (ref 38.4–49.9)
HEMOGLOBIN: 13.4 g/dL (ref 13.0–17.1)
LYMPHS ABS: 0.8 10*3/uL — AB (ref 0.9–3.3)
LYMPHS PCT: 9 %
MCH: 27.6 pg (ref 27.2–33.4)
MCHC: 33.1 g/dL (ref 32.0–36.0)
MCV: 83.2 fL (ref 79.3–98.0)
Monocytes Absolute: 0.8 10*3/uL (ref 0.1–0.9)
Monocytes Relative: 9 %
NEUTROS PCT: 79 %
Neutro Abs: 6.9 10*3/uL — ABNORMAL HIGH (ref 1.5–6.5)
PLATELETS: 242 10*3/uL (ref 140–400)
RBC: 4.86 MIL/uL (ref 4.20–5.82)
RDW: 15.1 % — ABNORMAL HIGH (ref 11.0–14.6)
WBC: 8.8 10*3/uL (ref 4.0–10.3)

## 2017-05-02 LAB — CMP (CANCER CENTER ONLY)
ALT: 40 U/L (ref 0–55)
ANION GAP: 9 (ref 3–11)
AST: 20 U/L (ref 5–34)
Albumin: 3.5 g/dL (ref 3.5–5.0)
Alkaline Phosphatase: 82 U/L (ref 40–150)
BILIRUBIN TOTAL: 0.3 mg/dL (ref 0.2–1.2)
BUN: 19 mg/dL (ref 7–26)
CHLORIDE: 106 mmol/L (ref 98–109)
CO2: 24 mmol/L (ref 22–29)
Calcium: 9.1 mg/dL (ref 8.4–10.4)
Creatinine: 1 mg/dL (ref 0.70–1.30)
Glucose, Bld: 121 mg/dL (ref 70–140)
POTASSIUM: 4.1 mmol/L (ref 3.5–5.1)
Sodium: 139 mmol/L (ref 136–145)
TOTAL PROTEIN: 6.6 g/dL (ref 6.4–8.3)

## 2017-05-02 MED ORDER — DEXAMETHASONE SODIUM PHOSPHATE 10 MG/ML IJ SOLN
INTRAMUSCULAR | Status: AC
  Filled 2017-05-02: qty 1

## 2017-05-02 MED ORDER — SODIUM CHLORIDE 0.9 % IV SOLN
Freq: Once | INTRAVENOUS | Status: AC
  Administered 2017-05-02: 09:00:00 via INTRAVENOUS

## 2017-05-02 MED ORDER — SODIUM CHLORIDE 0.9 % IV SOLN
180.8000 mg | Freq: Once | INTRAVENOUS | Status: AC
  Administered 2017-05-02: 180 mg via INTRAVENOUS
  Filled 2017-05-02: qty 18

## 2017-05-02 MED ORDER — PALONOSETRON HCL INJECTION 0.25 MG/5ML
0.2500 mg | Freq: Once | INTRAVENOUS | Status: AC
  Administered 2017-05-02: 0.25 mg via INTRAVENOUS

## 2017-05-02 MED ORDER — DEXAMETHASONE SODIUM PHOSPHATE 10 MG/ML IJ SOLN
10.0000 mg | Freq: Once | INTRAMUSCULAR | Status: AC
  Administered 2017-05-02: 10 mg via INTRAVENOUS

## 2017-05-02 MED ORDER — PALONOSETRON HCL INJECTION 0.25 MG/5ML
INTRAVENOUS | Status: AC
Start: 2017-05-02 — End: 2017-05-02
  Filled 2017-05-02: qty 5

## 2017-05-02 NOTE — Patient Instructions (Signed)
[facility] Discharge Instructions for Patients Receiving Chemotherapy  Today you received the following chemotherapy agents: Carboplatin.  To help prevent nausea and vomiting after your treatment, we encourage you to take your nausea medication as prescribed.   If you develop nausea and vomiting that is not controlled by your nausea medication, call the clinic.   BELOW ARE SYMPTOMS THAT SHOULD BE REPORTED IMMEDIATELY:  *FEVER GREATER THAN 100.5 F  *CHILLS WITH OR WITHOUT FEVER  NAUSEA AND VOMITING THAT IS NOT CONTROLLED WITH YOUR NAUSEA MEDICATION  *UNUSUAL SHORTNESS OF BREATH  *UNUSUAL BRUISING OR BLEEDING  TENDERNESS IN MOUTH AND THROAT WITH OR WITHOUT PRESENCE OF ULCERS  *URINARY PROBLEMS  *BOWEL PROBLEMS  UNUSUAL RASH Items with * indicate a potential emergency and should be followed up as soon as possible.  Feel free to call the clinic should you have any questions or concerns. The clinic phone number is [phone].  Please show the CHEMO ALERT CARD at check-in to the Emergency Department and triage nurse.     

## 2017-05-03 ENCOUNTER — Ambulatory Visit
Admission: RE | Admit: 2017-05-03 | Discharge: 2017-05-03 | Disposition: A | Discharge status: Home / Self Care | Payer: Medicare HMO | Source: Ambulatory Visit | Attending: Radiation Oncology | Admitting: Radiation Oncology

## 2017-05-03 ENCOUNTER — Telehealth: Payer: Self-pay | Admitting: *Deleted

## 2017-05-03 DIAGNOSIS — C672 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: Secondary | ICD-10-CM

## 2017-05-03 DIAGNOSIS — Z510 Encounter for antineoplastic radiation therapy: Secondary | ICD-10-CM | POA: Diagnosis not present

## 2017-05-03 DIAGNOSIS — C678 Malignant neoplasm of overlapping sites of bladder: Secondary | ICD-10-CM

## 2017-05-03 LAB — URINALYSIS, COMPLETE (UACMP) WITH MICROSCOPIC
Bilirubin Urine: NEGATIVE
Ketones, ur: NEGATIVE mg/dL
Leukocytes, UA: NEGATIVE
Nitrite: POSITIVE — AB
Protein, ur: 30 mg/dL — AB
RBC / HPF: >50 RBC/hpf — ABNORMAL HIGH (ref 0–5)
SPECIFIC GRAVITY, URINE: 1.022 (ref 1.005–1.030)
WBC, UA: >50 WBC/hpf — ABNORMAL HIGH (ref 0–5)
pH: 5 (ref 5.0–8.0)

## 2017-05-03 NOTE — Telephone Encounter (Signed)
Left a voicemail for Lang regarding her father [name].  I informed her of the results of her fathers UA earlier.  I let her know that Dr. Lisbeth Renshaw has sent in a prescription for her father and the pharmacy should be calling soon for pick up.  Left a call back number if there was any questions or concerns.  Will continue to follow as necessary.  Cori Razor, RN

## 2017-05-04 LAB — URINE CULTURE: CULTURE: NO GROWTH

## 2017-05-06 ENCOUNTER — Ambulatory Visit
Admission: RE | Admit: 2017-05-06 | Discharge: 2017-05-06 | Disposition: A | Discharge status: Home / Self Care | Payer: Medicare HMO | Source: Ambulatory Visit | Attending: Radiation Oncology | Admitting: Radiation Oncology

## 2017-05-06 DIAGNOSIS — C672 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: Secondary | ICD-10-CM | POA: Diagnosis not present

## 2017-05-06 DIAGNOSIS — Z510 Encounter for antineoplastic radiation therapy: Secondary | ICD-10-CM | POA: Diagnosis not present

## 2017-05-07 ENCOUNTER — Ambulatory Visit
Admission: RE | Admit: 2017-05-07 | Discharge: 2017-05-07 | Disposition: A | Discharge status: Home / Self Care | Payer: Medicare HMO | Source: Ambulatory Visit | Attending: Radiation Oncology | Admitting: Radiation Oncology

## 2017-05-07 DIAGNOSIS — C672 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: Secondary | ICD-10-CM | POA: Diagnosis not present

## 2017-05-07 DIAGNOSIS — Z510 Encounter for antineoplastic radiation therapy: Secondary | ICD-10-CM | POA: Diagnosis not present

## 2017-05-08 ENCOUNTER — Ambulatory Visit
Admission: RE | Admit: 2017-05-08 | Discharge: 2017-05-08 | Disposition: A | Discharge status: Home / Self Care | Payer: Medicare HMO | Source: Ambulatory Visit | Attending: Radiation Oncology | Admitting: Radiation Oncology

## 2017-05-08 DIAGNOSIS — C672 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: Secondary | ICD-10-CM | POA: Diagnosis not present

## 2017-05-08 DIAGNOSIS — Z510 Encounter for antineoplastic radiation therapy: Secondary | ICD-10-CM | POA: Diagnosis not present

## 2017-05-09 ENCOUNTER — Telehealth: Payer: Self-pay | Admitting: Oncology

## 2017-05-09 ENCOUNTER — Ambulatory Visit
Admission: RE | Admit: 2017-05-09 | Discharge: 2017-05-09 | Disposition: A | Discharge status: Home / Self Care | Payer: Medicare HMO | Source: Ambulatory Visit | Attending: Radiation Oncology | Admitting: Radiation Oncology

## 2017-05-09 ENCOUNTER — Inpatient Hospital Stay: Payer: Medicare HMO

## 2017-05-09 ENCOUNTER — Inpatient Hospital Stay: Payer: Medicare HMO | Attending: Oncology

## 2017-05-09 ENCOUNTER — Inpatient Hospital Stay (HOSPITAL_BASED_OUTPATIENT_CLINIC_OR_DEPARTMENT_OTHER): Payer: Medicare HMO | Admitting: Oncology

## 2017-05-09 VITALS — BP 146/76 | HR 90 | Temp 98.2°F | Resp 18 | Ht 64.0 in | Wt 144.9 lb

## 2017-05-09 DIAGNOSIS — K5900 Constipation, unspecified: Secondary | ICD-10-CM | POA: Insufficient documentation

## 2017-05-09 DIAGNOSIS — C672 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: Secondary | ICD-10-CM

## 2017-05-09 DIAGNOSIS — R300 Dysuria: Secondary | ICD-10-CM | POA: Diagnosis not present

## 2017-05-09 DIAGNOSIS — Z5111 Encounter for antineoplastic chemotherapy: Secondary | ICD-10-CM | POA: Insufficient documentation

## 2017-05-09 DIAGNOSIS — C678 Malignant neoplasm of overlapping sites of bladder: Secondary | ICD-10-CM

## 2017-05-09 DIAGNOSIS — C679 Malignant neoplasm of bladder, unspecified: Secondary | ICD-10-CM

## 2017-05-09 DIAGNOSIS — Z510 Encounter for antineoplastic radiation therapy: Secondary | ICD-10-CM | POA: Diagnosis not present

## 2017-05-09 LAB — CMP (CANCER CENTER ONLY)
ALT: 36 U/L (ref 0–55)
ANION GAP: 8 (ref 3–11)
AST: 24 U/L (ref 5–34)
Albumin: 3.8 g/dL (ref 3.5–5.0)
Alkaline Phosphatase: 61 U/L (ref 40–150)
BUN: 17 mg/dL (ref 7–26)
CHLORIDE: 107 mmol/L (ref 98–109)
CO2: 22 mmol/L (ref 22–29)
Calcium: 8.9 mg/dL (ref 8.4–10.4)
Creatinine: 1.13 mg/dL (ref 0.70–1.30)
Glucose, Bld: 138 mg/dL (ref 70–140)
POTASSIUM: 3.8 mmol/L (ref 3.5–5.1)
Sodium: 137 mmol/L (ref 136–145)
Total Bilirubin: 0.3 mg/dL (ref 0.2–1.2)
Total Protein: 6.7 g/dL (ref 6.4–8.3)

## 2017-05-09 LAB — CBC WITH DIFFERENTIAL (CANCER CENTER ONLY)
Basophils Absolute: 0 10*3/uL (ref 0.0–0.1)
Basophils Relative: 1 %
EOS ABS: 0.4 10*3/uL (ref 0.0–0.5)
Eosinophils Relative: 6 %
HCT: 38.6 % (ref 38.4–49.9)
HEMOGLOBIN: 13 g/dL (ref 13.0–17.1)
LYMPHS ABS: 0.6 10*3/uL — AB (ref 0.9–3.3)
LYMPHS PCT: 10 %
MCH: 28 pg (ref 27.2–33.4)
MCHC: 33.6 g/dL (ref 32.0–36.0)
MCV: 83.2 fL (ref 79.3–98.0)
Monocytes Absolute: 0.6 10*3/uL (ref 0.1–0.9)
Monocytes Relative: 10 %
NEUTROS ABS: 4.5 10*3/uL (ref 1.5–6.5)
NEUTROS PCT: 73 %
Platelet Count: 211 10*3/uL (ref 140–400)
RBC: 4.63 MIL/uL (ref 4.20–5.82)
RDW: 16.2 % — ABNORMAL HIGH (ref 11.0–14.6)
WBC: 6.2 10*3/uL (ref 4.0–10.3)

## 2017-05-09 MED ORDER — PALONOSETRON HCL INJECTION 0.25 MG/5ML
INTRAVENOUS | Status: AC
  Filled 2017-05-09: qty 5

## 2017-05-09 MED ORDER — SODIUM CHLORIDE 0.9 % IV SOLN
165.6000 mg | Freq: Once | INTRAVENOUS | Status: AC
  Administered 2017-05-09: 170 mg via INTRAVENOUS
  Filled 2017-05-09: qty 17

## 2017-05-09 MED ORDER — DEXAMETHASONE SODIUM PHOSPHATE 10 MG/ML IJ SOLN
10.0000 mg | Freq: Once | INTRAMUSCULAR | Status: AC
  Administered 2017-05-09: 10 mg via INTRAVENOUS

## 2017-05-09 MED ORDER — SODIUM CHLORIDE 0.9 % IV SOLN
Freq: Once | INTRAVENOUS | Status: AC
  Administered 2017-05-09: 10:00:00 via INTRAVENOUS

## 2017-05-09 MED ORDER — PALONOSETRON HCL INJECTION 0.25 MG/5ML
0.2500 mg | Freq: Once | INTRAVENOUS | Status: AC
  Administered 2017-05-09: 0.25 mg via INTRAVENOUS

## 2017-05-09 MED ORDER — DEXAMETHASONE SODIUM PHOSPHATE 10 MG/ML IJ SOLN
INTRAMUSCULAR | Status: AC
  Filled 2017-05-09: qty 1

## 2017-05-09 NOTE — Patient Instructions (Signed)
[facility] Discharge Instructions for Patients Receiving Chemotherapy  Today you received the following chemotherapy agents: Carboplatin.  To help prevent nausea and vomiting after your treatment, we encourage you to take your nausea medication as prescribed.   If you develop nausea and vomiting that is not controlled by your nausea medication, call the clinic.   BELOW ARE SYMPTOMS THAT SHOULD BE REPORTED IMMEDIATELY:  *FEVER GREATER THAN 100.5 F  *CHILLS WITH OR WITHOUT FEVER  NAUSEA AND VOMITING THAT IS NOT CONTROLLED WITH YOUR NAUSEA MEDICATION  *UNUSUAL SHORTNESS OF BREATH  *UNUSUAL BRUISING OR BLEEDING  TENDERNESS IN MOUTH AND THROAT WITH OR WITHOUT PRESENCE OF ULCERS  *URINARY PROBLEMS  *BOWEL PROBLEMS  UNUSUAL RASH Items with * indicate a potential emergency and should be followed up as soon as possible.  Feel free to call the clinic should you have any questions or concerns. The clinic phone number is [phone].  Please show the CHEMO ALERT CARD at check-in to the Emergency Department and triage nurse.     

## 2017-05-09 NOTE — Telephone Encounter (Signed)
Appointments scheduled AVS/Calendar printed per 5/2 los °

## 2017-05-09 NOTE — Progress Notes (Signed)
Hematology and Oncology Follow Up Visit  [name] [id number] Jan 26, 1945 72 y.o. 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  His disease is muscle invasive and multifocal in nature.   Prior Therapy: He is status post TURBT done on March 15, 2017.  Current therapy: Definitive treatment with radiation and weekly carboplatin.  He is here for cycle 4 of therapy.  Interim History: [name]  presents today for a follow-up visit.  Since the last visit, he continues to tolerate therapy without any major complaints.  He does have issues with dysuria and urine analysis did not show a urinary tract infection.  He denies any hematuria or decline in his performance status.  His appetite remained reasonable and his weight is stable.  He does report mild constipation but no diarrhea.  No nausea or vomiting.  No infusion related complications related to carboplatin.  He does not report any headaches, blurry vision, syncope or seizures. Does not report any fevers, chills or sweats.  Does not report any cough, wheezing or hemoptysis.  Does not report any chest pain, palpitation, orthopnea or leg edema.  Does not report any nausea, vomiting or abdominal pain.  Does not report any constipation or diarrhea.  Does not report any skeletal complaints.    Does not report frequency, urgency or hematuria.  Does not report any skin rashes or lesions. Does not report any heat or cold intolerance.  Does not report any lymphadenopathy or petechiae.  Does not report any anxiety or depression.  Remaining review of systems is negative.    Medications: I have reviewed the patient's current medications.  Current Outpatient Medications  Medication Sig Dispense Refill  . acetaminophen (TYLENOL) 500 MG tablet Take 500 mg by mouth every 8 (eight) hours as needed for mild pain, moderate pain, fever or headache.     . albuterol (PROVENTIL HFA) 108 (90 BASE) MCG/ACT inhaler Inhale 2 puffs into the lungs 2 (two) times daily. 1 Inhaler 1  . albuterol (PROVENTIL) (2.5 MG/3ML) 0.083% nebulizer solution Take 2.5 mg by nebulization every 6 (six) hours as needed for wheezing or shortness of breath.    [name] amLODipine (NORVASC) 10 MG tablet     . amLODipine (NORVASC) 5 MG tablet TAKE 1 TABLET (5 MG TOTAL) BY MOUTH DAILY. 30 tablet 9  . aspirin EC 81 MG tablet Take 81 mg by mouth daily.    [name] ibuprofen (ADVIL,MOTRIN) 200 MG tablet Take 200 mg by mouth every 8 (eight) hours as needed for fever, headache, mild pain, moderate pain or cramping.    . metFORMIN (GLUCOPHAGE) 500 MG tablet Take 500 mg by mouth 2 (two) times daily with a meal.     . nitroGLYCERIN (NITROSTAT) 0.4 MG SL tablet Place 1 tablet (0.4 mg total) under the tongue every 5 (five) minutes as needed for chest pain. 25 tablet 3  . omeprazole (PRILOSEC) 20 MG capsule Take 1 capsule (20 mg total) by mouth 2 (two) times daily before a meal. 60 capsule 6  . potassium chloride SA (K-DUR,KLOR-CON) 20 MEQ tablet Take 1 tablet (20 mEq total) by mouth daily. 14 tablet 0  . prochlorperazine (COMPAZINE) 10 MG tablet TAKE 1 TABLET (10 MG TOTAL) BY MOUTH EVERY 6 (SIX) HOURS AS NEEDED FOR NAUSEA OR VOMITING. 30 tablet 0  . rosuvastatin (CRESTOR) 40 MG tablet TAKE 1 TABLET BY MOUTH EVERY DAY 90 tablet 1  . tamsulosin (  FLOMAX) 0.4 MG CAPS capsule Take 2 capsules (0.8 mg total) by mouth every evening. (Patient taking differently: Take 0.4 mg by mouth every evening. ) 30 capsule 2  . tiotropium (SPIRIVA HANDIHALER) 18 MCG inhalation capsule Place 1 capsule (18 mcg total) into inhaler and inhale daily. 30 capsule 1  . TOVIAZ 4 MG TB24 tablet Take 4 mg by mouth every evening.  5  . Vitamin D, Ergocalciferol, (DRISDOL) 50000 units CAPS capsule 1 PO ONCE A WEEK  5   No current facility-administered medications for this visit.      Allergies: No Known Allergies  Past Medical  History, Surgical history, Social history, and Family History were reviewed and updated.  Review of Systems:  Remaining ROS negative.  Physical Exam: Blood pressure (!) 146/76, pulse 90, temperature 98.2 F (36.8 C), temperature source Oral, resp. rate 18, height 5\' 4"  (1.626 m), weight 144 lb 14.4 oz (65.7 kg), SpO2 97 %. ECOG:  General appearance: alert and cooperative appeared without distress. Head: Normocephalic, without obvious abnormality Oropharynx: No oral thrush or ulcers. Eyes: No scleral icterus.  Pupils are equal and round reactive to light. Lymph nodes: Cervical, supraclavicular, and axillary nodes normal. Heart:regular rate and rhythm, S1, S2 normal, no murmur, click, rub or gallop Lung:chest clear, no wheezing, rales, normal symmetric air entry Abdomin: soft, non-tender, without masses or organomegaly. Neurological: No motor, sensory deficits.  Intact deep tendon reflexes. Skin: No rashes or lesions.  No ecchymosis or petechiae. Musculoskeletal: No joint deformity or effusion. Psychiatric: Mood and affect are appropriate.    Lab Results: Lab Results  Component Value Date   WBC 6.2 05/09/2017   HGB 13.0 05/09/2017   HCT 38.6 05/09/2017   MCV 83.2 05/09/2017   PLT 211 05/09/2017     Chemistry      Component Value Date/Time   NA 139 05/02/2017 0800   K 4.1 05/02/2017 0800   CL 106 05/02/2017 0800   CO2 24 05/02/2017 0800   BUN 19 05/02/2017 0800   CREATININE 1.00 05/02/2017 0800   CREATININE 0.75 04/08/2015 1049      Component Value Date/Time   CALCIUM 9.1 05/02/2017 0800   ALKPHOS 82 05/02/2017 0800   AST 20 05/02/2017 0800   ALT 40 05/02/2017 0800   BILITOT 0.3 05/02/2017 0800       Impression and Plan:  72 year old man with:  1.    T2N0 muscle invasive high-grade urothelial carcinoma of the bladder diagnosed in March 2019.    He Is currently receiving definitive therapy with radiation and weekly carboplatin.  He reports no complications  related to this therapy and risks and benefits of continuing this treatment was reviewed today and is agreeable to continue.  His last week of therapy will be on May 27, 2017.   2.  IV access: Peripheral veins remain in use without any complications.  3.  Renal function: His creatinine is back to baseline currently at 1.0.  No electrolyte imbalance noted.  4.  Antiemetics: Prescription for Compazine is available to him without any recent nausea or vomiting.  5.  Follow-up: We will be weekly to complete 6 weeks of treatment.  We will have MD follow-up on May 27, 2017.  15  minutes was spent with the patient face-to-face today.  More than 50% of time was dedicated to patient counseling, education and coordinating his future plan of care.     [name], MD 5/2/20199:42 AM

## 2017-05-10 ENCOUNTER — Ambulatory Visit
Admission: RE | Admit: 2017-05-10 | Discharge: 2017-05-10 | Disposition: A | Discharge status: Home / Self Care | Payer: Medicare HMO | Source: Ambulatory Visit | Attending: Radiation Oncology | Admitting: Radiation Oncology

## 2017-05-10 DIAGNOSIS — C672 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: Secondary | ICD-10-CM | POA: Diagnosis not present

## 2017-05-10 DIAGNOSIS — Z510 Encounter for antineoplastic radiation therapy: Secondary | ICD-10-CM | POA: Diagnosis not present

## 2017-05-13 ENCOUNTER — Ambulatory Visit
Admission: RE | Admit: 2017-05-13 | Discharge: 2017-05-13 | Disposition: A | Discharge status: Home / Self Care | Payer: Medicare HMO | Source: Ambulatory Visit | Attending: Radiation Oncology | Admitting: Radiation Oncology

## 2017-05-13 DIAGNOSIS — Z510 Encounter for antineoplastic radiation therapy: Secondary | ICD-10-CM | POA: Diagnosis not present

## 2017-05-13 DIAGNOSIS — C672 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: Secondary | ICD-10-CM | POA: Diagnosis not present

## 2017-05-14 ENCOUNTER — Ambulatory Visit
Admission: RE | Admit: 2017-05-14 | Discharge: 2017-05-14 | Disposition: A | Discharge status: Home / Self Care | Payer: Medicare HMO | Source: Ambulatory Visit | Attending: Radiation Oncology | Admitting: Radiation Oncology

## 2017-05-14 DIAGNOSIS — C672 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: Secondary | ICD-10-CM | POA: Diagnosis not present

## 2017-05-14 DIAGNOSIS — Z510 Encounter for antineoplastic radiation therapy: Secondary | ICD-10-CM | POA: Diagnosis not present

## 2017-05-15 ENCOUNTER — Ambulatory Visit
Admission: RE | Admit: 2017-05-15 | Discharge: 2017-05-15 | Disposition: A | Discharge status: Home / Self Care | Payer: Medicare HMO | Source: Ambulatory Visit | Attending: Radiation Oncology | Admitting: Radiation Oncology

## 2017-05-15 DIAGNOSIS — Z510 Encounter for antineoplastic radiation therapy: Secondary | ICD-10-CM | POA: Diagnosis not present

## 2017-05-15 DIAGNOSIS — C679 Malignant neoplasm of bladder, unspecified: Secondary | ICD-10-CM | POA: Diagnosis not present

## 2017-05-15 DIAGNOSIS — K5900 Constipation, unspecified: Secondary | ICD-10-CM | POA: Diagnosis not present

## 2017-05-15 DIAGNOSIS — Z5111 Encounter for antineoplastic chemotherapy: Secondary | ICD-10-CM | POA: Diagnosis not present

## 2017-05-15 DIAGNOSIS — C672 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: Secondary | ICD-10-CM

## 2017-05-15 DIAGNOSIS — R300 Dysuria: Secondary | ICD-10-CM | POA: Diagnosis not present

## 2017-05-15 MED ORDER — PHENAZOPYRIDINE HCL 200 MG PO TABS: 200.0000 mg | ORAL_TABLET | Freq: Three times a day (TID) | ORAL | 1 refills | Status: DC

## 2017-05-16 ENCOUNTER — Inpatient Hospital Stay: Payer: Medicare HMO

## 2017-05-16 ENCOUNTER — Ambulatory Visit
Admission: RE | Admit: 2017-05-16 | Discharge: 2017-05-16 | Disposition: A | Discharge status: Home / Self Care | Payer: Medicare HMO | Source: Ambulatory Visit | Attending: Radiation Oncology | Admitting: Radiation Oncology

## 2017-05-16 VITALS — BP 140/80 | HR 80 | Temp 97.7°F | Resp 18

## 2017-05-16 DIAGNOSIS — C672 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: Secondary | ICD-10-CM

## 2017-05-16 DIAGNOSIS — K5900 Constipation, unspecified: Secondary | ICD-10-CM | POA: Diagnosis not present

## 2017-05-16 DIAGNOSIS — Z5111 Encounter for antineoplastic chemotherapy: Secondary | ICD-10-CM | POA: Diagnosis not present

## 2017-05-16 DIAGNOSIS — C679 Malignant neoplasm of bladder, unspecified: Secondary | ICD-10-CM | POA: Diagnosis not present

## 2017-05-16 DIAGNOSIS — Z510 Encounter for antineoplastic radiation therapy: Secondary | ICD-10-CM | POA: Diagnosis not present

## 2017-05-16 DIAGNOSIS — C678 Malignant neoplasm of overlapping sites of bladder: Secondary | ICD-10-CM

## 2017-05-16 DIAGNOSIS — R300 Dysuria: Secondary | ICD-10-CM | POA: Diagnosis not present

## 2017-05-16 LAB — CBC WITH DIFFERENTIAL (CANCER CENTER ONLY)
BASOS ABS: 0 10*3/uL (ref 0.0–0.1)
Basophils Relative: 0 %
EOS PCT: 4 %
Eosinophils Absolute: 0.2 10*3/uL (ref 0.0–0.5)
HCT: 38.3 % — ABNORMAL LOW (ref 38.4–49.9)
HEMOGLOBIN: 12.6 g/dL — AB (ref 13.0–17.1)
LYMPHS ABS: 0.8 10*3/uL — AB (ref 0.9–3.3)
Lymphocytes Relative: 13 %
MCH: 28.3 pg (ref 27.2–33.4)
MCHC: 32.9 g/dL (ref 32.0–36.0)
MCV: 85.9 fL (ref 79.3–98.0)
Monocytes Absolute: 0.6 10*3/uL (ref 0.1–0.9)
Monocytes Relative: 9 %
Neutro Abs: 4.5 10*3/uL (ref 1.5–6.5)
Neutrophils Relative %: 74 %
PLATELETS: 181 10*3/uL (ref 140–400)
RBC: 4.46 MIL/uL (ref 4.20–5.82)
RDW: 16.6 % — ABNORMAL HIGH (ref 11.0–14.6)
WBC Count: 6 10*3/uL (ref 4.0–10.3)

## 2017-05-16 LAB — CMP (CANCER CENTER ONLY)
ALBUMIN: 3.6 g/dL (ref 3.5–5.0)
ALK PHOS: 66 U/L (ref 40–150)
ALT: 40 U/L (ref 0–55)
ANION GAP: 7 (ref 3–11)
AST: 21 U/L (ref 5–34)
BILIRUBIN TOTAL: 0.3 mg/dL (ref 0.2–1.2)
BUN: 20 mg/dL (ref 7–26)
CALCIUM: 9 mg/dL (ref 8.4–10.4)
CO2: 26 mmol/L (ref 22–29)
CREATININE: 0.95 mg/dL (ref 0.70–1.30)
Chloride: 106 mmol/L (ref 98–109)
GFR, Est AFR Am: >60 mL/min (ref >60)
GFR, Estimated: >60 mL/min (ref >60)
Glucose, Bld: 126 mg/dL (ref 70–140)
Potassium: 3.8 mmol/L (ref 3.5–5.1)
Sodium: 139 mmol/L (ref 136–145)
TOTAL PROTEIN: 6.3 g/dL — AB (ref 6.4–8.3)

## 2017-05-16 MED ORDER — SODIUM CHLORIDE 0.9 % IV SOLN
180.8000 mg | Freq: Once | INTRAVENOUS | Status: AC
  Administered 2017-05-16: 180 mg via INTRAVENOUS
  Filled 2017-05-16: qty 18

## 2017-05-16 MED ORDER — DEXAMETHASONE SODIUM PHOSPHATE 10 MG/ML IJ SOLN
INTRAMUSCULAR | Status: AC
  Filled 2017-05-16: qty 1

## 2017-05-16 MED ORDER — SODIUM CHLORIDE 0.9 % IV SOLN
Freq: Once | INTRAVENOUS | Status: AC
  Administered 2017-05-16: 09:00:00 via INTRAVENOUS

## 2017-05-16 MED ORDER — DEXAMETHASONE SODIUM PHOSPHATE 10 MG/ML IJ SOLN
10.0000 mg | Freq: Once | INTRAMUSCULAR | Status: AC
  Administered 2017-05-16: 10 mg via INTRAVENOUS

## 2017-05-16 MED ORDER — PALONOSETRON HCL INJECTION 0.25 MG/5ML
INTRAVENOUS | Status: AC
  Filled 2017-05-16: qty 5

## 2017-05-16 MED ORDER — PALONOSETRON HCL INJECTION 0.25 MG/5ML
0.2500 mg | Freq: Once | INTRAVENOUS | Status: AC
  Administered 2017-05-16: 0.25 mg via INTRAVENOUS

## 2017-05-16 NOTE — Patient Instructions (Signed)
[facility] Discharge Instructions for Patients Receiving Chemotherapy  Today you received the following chemotherapy agents: Carboplatin.  To help prevent nausea and vomiting after your treatment, we encourage you to take your nausea medication as prescribed.   If you develop nausea and vomiting that is not controlled by your nausea medication, call the clinic.   BELOW ARE SYMPTOMS THAT SHOULD BE REPORTED IMMEDIATELY:  *FEVER GREATER THAN 100.5 F  *CHILLS WITH OR WITHOUT FEVER  NAUSEA AND VOMITING THAT IS NOT CONTROLLED WITH YOUR NAUSEA MEDICATION  *UNUSUAL SHORTNESS OF BREATH  *UNUSUAL BRUISING OR BLEEDING  TENDERNESS IN MOUTH AND THROAT WITH OR WITHOUT PRESENCE OF ULCERS  *URINARY PROBLEMS  *BOWEL PROBLEMS  UNUSUAL RASH Items with * indicate a potential emergency and should be followed up as soon as possible.  Feel free to call the clinic should you have any questions or concerns. The clinic phone number is [phone].  Please show the CHEMO ALERT CARD at check-in to the Emergency Department and triage nurse.     

## 2017-05-16 NOTE — Addendum Note (Signed)
Encounter addended by: Kyung Rudd, MD on: 05/16/2017 3:52 PM  Actions taken: Sign clinical note

## 2017-05-17 ENCOUNTER — Ambulatory Visit
Admission: RE | Admit: 2017-05-17 | Discharge: 2017-05-17 | Disposition: A | Discharge status: Home / Self Care | Payer: Medicare HMO | Source: Ambulatory Visit | Attending: Radiation Oncology | Admitting: Radiation Oncology

## 2017-05-17 DIAGNOSIS — Z510 Encounter for antineoplastic radiation therapy: Secondary | ICD-10-CM | POA: Diagnosis not present

## 2017-05-17 DIAGNOSIS — C672 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: Secondary | ICD-10-CM | POA: Diagnosis not present

## 2017-05-20 ENCOUNTER — Ambulatory Visit
Admission: RE | Admit: 2017-05-20 | Discharge: 2017-05-20 | Disposition: A | Discharge status: Home / Self Care | Payer: Medicare HMO | Source: Ambulatory Visit | Attending: Radiation Oncology | Admitting: Radiation Oncology

## 2017-05-20 DIAGNOSIS — Z510 Encounter for antineoplastic radiation therapy: Secondary | ICD-10-CM | POA: Diagnosis not present

## 2017-05-20 DIAGNOSIS — C672 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: Secondary | ICD-10-CM | POA: Diagnosis not present

## 2017-05-21 ENCOUNTER — Ambulatory Visit
Admission: RE | Admit: 2017-05-21 | Discharge: 2017-05-21 | Disposition: A | Discharge status: Home / Self Care | Payer: Medicare HMO | Source: Ambulatory Visit | Attending: Radiation Oncology | Admitting: Radiation Oncology

## 2017-05-21 DIAGNOSIS — C672 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: Secondary | ICD-10-CM | POA: Diagnosis not present

## 2017-05-21 DIAGNOSIS — Z510 Encounter for antineoplastic radiation therapy: Secondary | ICD-10-CM | POA: Diagnosis not present

## 2017-05-22 ENCOUNTER — Ambulatory Visit
Admission: RE | Admit: 2017-05-22 | Discharge: 2017-05-22 | Disposition: A | Discharge status: Home / Self Care | Payer: Medicare HMO | Source: Ambulatory Visit | Attending: Radiation Oncology | Admitting: Radiation Oncology

## 2017-05-22 DIAGNOSIS — Z5111 Encounter for antineoplastic chemotherapy: Secondary | ICD-10-CM | POA: Diagnosis not present

## 2017-05-22 DIAGNOSIS — Z510 Encounter for antineoplastic radiation therapy: Secondary | ICD-10-CM | POA: Diagnosis not present

## 2017-05-22 DIAGNOSIS — R300 Dysuria: Secondary | ICD-10-CM | POA: Diagnosis not present

## 2017-05-22 DIAGNOSIS — C679 Malignant neoplasm of bladder, unspecified: Secondary | ICD-10-CM | POA: Diagnosis not present

## 2017-05-22 DIAGNOSIS — K5900 Constipation, unspecified: Secondary | ICD-10-CM | POA: Diagnosis not present

## 2017-05-22 DIAGNOSIS — C672 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: Secondary | ICD-10-CM | POA: Diagnosis not present

## 2017-05-23 ENCOUNTER — Inpatient Hospital Stay: Payer: Medicare HMO

## 2017-05-23 ENCOUNTER — Ambulatory Visit
Admission: RE | Admit: 2017-05-23 | Discharge: 2017-05-23 | Disposition: A | Discharge status: Home / Self Care | Payer: Medicare HMO | Source: Ambulatory Visit | Attending: Radiation Oncology | Admitting: Radiation Oncology

## 2017-05-23 VITALS — BP 143/85 | HR 88 | Temp 98.2°F | Resp 18

## 2017-05-23 DIAGNOSIS — C672 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: Secondary | ICD-10-CM | POA: Diagnosis not present

## 2017-05-23 DIAGNOSIS — R300 Dysuria: Secondary | ICD-10-CM | POA: Diagnosis not present

## 2017-05-23 DIAGNOSIS — K5900 Constipation, unspecified: Secondary | ICD-10-CM | POA: Diagnosis not present

## 2017-05-23 DIAGNOSIS — C678 Malignant neoplasm of overlapping sites of bladder: Secondary | ICD-10-CM

## 2017-05-23 DIAGNOSIS — Z5111 Encounter for antineoplastic chemotherapy: Secondary | ICD-10-CM | POA: Diagnosis not present

## 2017-05-23 DIAGNOSIS — Z510 Encounter for antineoplastic radiation therapy: Secondary | ICD-10-CM | POA: Diagnosis not present

## 2017-05-23 DIAGNOSIS — C679 Malignant neoplasm of bladder, unspecified: Secondary | ICD-10-CM | POA: Diagnosis not present

## 2017-05-23 LAB — CBC WITH DIFFERENTIAL (CANCER CENTER ONLY)
BASOS ABS: 0 10*3/uL (ref 0.0–0.1)
BASOS PCT: 1 %
Eosinophils Absolute: 0.1 10*3/uL (ref 0.0–0.5)
Eosinophils Relative: 1 %
HEMATOCRIT: 37.5 % — AB (ref 38.4–49.9)
HEMOGLOBIN: 12.7 g/dL — AB (ref 13.0–17.1)
Lymphocytes Relative: 10 %
Lymphs Abs: 0.4 10*3/uL — ABNORMAL LOW (ref 0.9–3.3)
MCH: 28.7 pg (ref 27.2–33.4)
MCHC: 33.9 g/dL (ref 32.0–36.0)
MCV: 84.6 fL (ref 79.3–98.0)
MONO ABS: 0.6 10*3/uL (ref 0.1–0.9)
MONOS PCT: 14 %
NEUTROS ABS: 3.1 10*3/uL (ref 1.5–6.5)
NEUTROS PCT: 74 %
Platelet Count: 199 10*3/uL (ref 140–400)
RBC: 4.43 MIL/uL (ref 4.20–5.82)
RDW: 18.9 % — ABNORMAL HIGH (ref 11.0–14.6)
WBC Count: 4.2 10*3/uL (ref 4.0–10.3)

## 2017-05-23 LAB — CMP (CANCER CENTER ONLY)
ALBUMIN: 3.8 g/dL (ref 3.5–5.0)
ALK PHOS: 61 U/L (ref 40–150)
ALT: 33 U/L (ref 0–55)
ANION GAP: 9 (ref 3–11)
AST: 22 U/L (ref 5–34)
BUN: 21 mg/dL (ref 7–26)
CALCIUM: 9.6 mg/dL (ref 8.4–10.4)
CO2: 23 mmol/L (ref 22–29)
Chloride: 106 mmol/L (ref 98–109)
Creatinine: 0.89 mg/dL (ref 0.70–1.30)
GFR, Estimated: >60 mL/min (ref >60)
GLUCOSE: 113 mg/dL (ref 70–140)
POTASSIUM: 4 mmol/L (ref 3.5–5.1)
SODIUM: 138 mmol/L (ref 136–145)
Total Bilirubin: 0.4 mg/dL (ref 0.2–1.2)
Total Protein: 6.7 g/dL (ref 6.4–8.3)

## 2017-05-23 MED ORDER — PALONOSETRON HCL INJECTION 0.25 MG/5ML
0.2500 mg | Freq: Once | INTRAVENOUS | Status: AC
  Administered 2017-05-23: 0.25 mg via INTRAVENOUS

## 2017-05-23 MED ORDER — SODIUM CHLORIDE 0.9 % IV SOLN
Freq: Once | INTRAVENOUS | Status: AC
  Administered 2017-05-23: 10:00:00 via INTRAVENOUS

## 2017-05-23 MED ORDER — DEXAMETHASONE SODIUM PHOSPHATE 10 MG/ML IJ SOLN
INTRAMUSCULAR | Status: AC
  Filled 2017-05-23: qty 1

## 2017-05-23 MED ORDER — PALONOSETRON HCL INJECTION 0.25 MG/5ML
INTRAVENOUS | Status: AC
  Filled 2017-05-23: qty 5

## 2017-05-23 MED ORDER — CARBOPLATIN CHEMO INJECTION 450 MG/45ML
180.8000 mg | Freq: Once | INTRAVENOUS | Status: AC
  Administered 2017-05-23: 180 mg via INTRAVENOUS
  Filled 2017-05-23: qty 18

## 2017-05-23 MED ORDER — DEXAMETHASONE SODIUM PHOSPHATE 10 MG/ML IJ SOLN
10.0000 mg | Freq: Once | INTRAMUSCULAR | Status: AC
  Administered 2017-05-23: 10 mg via INTRAVENOUS

## 2017-05-23 NOTE — Patient Instructions (Signed)
[facility] Discharge Instructions for Patients Receiving Chemotherapy  Today you received the following chemotherapy agents: Carboplatin.  To help prevent nausea and vomiting after your treatment, we encourage you to take your nausea medication as prescribed.   If you develop nausea and vomiting that is not controlled by your nausea medication, call the clinic.   BELOW ARE SYMPTOMS THAT SHOULD BE REPORTED IMMEDIATELY:  *FEVER GREATER THAN 100.5 F  *CHILLS WITH OR WITHOUT FEVER  NAUSEA AND VOMITING THAT IS NOT CONTROLLED WITH YOUR NAUSEA MEDICATION  *UNUSUAL SHORTNESS OF BREATH  *UNUSUAL BRUISING OR BLEEDING  TENDERNESS IN MOUTH AND THROAT WITH OR WITHOUT PRESENCE OF ULCERS  *URINARY PROBLEMS  *BOWEL PROBLEMS  UNUSUAL RASH Items with * indicate a potential emergency and should be followed up as soon as possible.  Feel free to call the clinic should you have any questions or concerns. The clinic phone number is [phone].  Please show the CHEMO ALERT CARD at check-in to the Emergency Department and triage nurse.     

## 2017-05-24 ENCOUNTER — Ambulatory Visit
Admission: RE | Admit: 2017-05-24 | Discharge: 2017-05-24 | Disposition: A | Discharge status: Home / Self Care | Payer: Medicare HMO | Source: Ambulatory Visit | Attending: Radiation Oncology | Admitting: Radiation Oncology

## 2017-05-24 DIAGNOSIS — Z510 Encounter for antineoplastic radiation therapy: Secondary | ICD-10-CM | POA: Diagnosis not present

## 2017-05-24 DIAGNOSIS — C672 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: Secondary | ICD-10-CM | POA: Diagnosis not present

## 2017-05-27 ENCOUNTER — Inpatient Hospital Stay: Payer: Medicare HMO

## 2017-05-27 ENCOUNTER — Inpatient Hospital Stay (HOSPITAL_BASED_OUTPATIENT_CLINIC_OR_DEPARTMENT_OTHER): Payer: Medicare HMO | Admitting: Oncology

## 2017-05-27 ENCOUNTER — Ambulatory Visit
Admission: RE | Admit: 2017-05-27 | Discharge: 2017-05-27 | Disposition: A | Discharge status: Home / Self Care | Payer: Medicare HMO | Source: Ambulatory Visit | Attending: Radiation Oncology | Admitting: Radiation Oncology

## 2017-05-27 ENCOUNTER — Telehealth: Payer: Self-pay

## 2017-05-27 VITALS — BP 128/60 | HR 96 | Temp 98.5°F | Resp 18 | Ht 64.0 in | Wt 147.4 lb

## 2017-05-27 DIAGNOSIS — Z510 Encounter for antineoplastic radiation therapy: Secondary | ICD-10-CM | POA: Diagnosis not present

## 2017-05-27 DIAGNOSIS — K5900 Constipation, unspecified: Secondary | ICD-10-CM

## 2017-05-27 DIAGNOSIS — R300 Dysuria: Secondary | ICD-10-CM

## 2017-05-27 DIAGNOSIS — C679 Malignant neoplasm of bladder, unspecified: Secondary | ICD-10-CM | POA: Diagnosis not present

## 2017-05-27 DIAGNOSIS — C678 Malignant neoplasm of overlapping sites of bladder: Secondary | ICD-10-CM

## 2017-05-27 DIAGNOSIS — C672 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: Secondary | ICD-10-CM | POA: Diagnosis not present

## 2017-05-27 DIAGNOSIS — Z5111 Encounter for antineoplastic chemotherapy: Secondary | ICD-10-CM | POA: Diagnosis not present

## 2017-05-27 LAB — CBC WITH DIFFERENTIAL (CANCER CENTER ONLY)
Basophils Absolute: 0 10*3/uL (ref 0.0–0.1)
Basophils Relative: 0 %
EOS PCT: 1 %
Eosinophils Absolute: 0.1 10*3/uL (ref 0.0–0.5)
HEMATOCRIT: 39.8 % (ref 38.4–49.9)
Hemoglobin: 13.3 g/dL (ref 13.0–17.1)
LYMPHS ABS: 0.5 10*3/uL — AB (ref 0.9–3.3)
LYMPHS PCT: 6 %
MCH: 28.3 pg (ref 27.2–33.4)
MCHC: 33.4 g/dL (ref 32.0–36.0)
MCV: 85 fL (ref 79.3–98.0)
MONO ABS: 0.6 10*3/uL (ref 0.1–0.9)
Monocytes Relative: 8 %
Neutro Abs: 6.7 10*3/uL — ABNORMAL HIGH (ref 1.5–6.5)
Neutrophils Relative %: 85 %
PLATELETS: 221 10*3/uL (ref 140–400)
RBC: 4.69 MIL/uL (ref 4.20–5.82)
RDW: 19.1 % — AB (ref 11.0–14.6)
WBC Count: 7.8 10*3/uL (ref 4.0–10.3)

## 2017-05-27 LAB — CMP (CANCER CENTER ONLY)
ALBUMIN: 3.9 g/dL (ref 3.5–5.0)
ALT: 50 U/L (ref 0–55)
AST: 20 U/L (ref 5–34)
Alkaline Phosphatase: 60 U/L (ref 40–150)
Anion gap: 9 (ref 3–11)
BUN: 18 mg/dL (ref 7–26)
CHLORIDE: 103 mmol/L (ref 98–109)
CO2: 27 mmol/L (ref 22–29)
Calcium: 10.2 mg/dL (ref 8.4–10.4)
Creatinine: 1.01 mg/dL (ref 0.70–1.30)
GFR, Est AFR Am: >60 mL/min (ref >60)
GFR, Estimated: >60 mL/min (ref >60)
GLUCOSE: 106 mg/dL (ref 70–140)
POTASSIUM: 3.5 mmol/L (ref 3.5–5.1)
SODIUM: 139 mmol/L (ref 136–145)
Total Bilirubin: 0.6 mg/dL (ref 0.2–1.2)
Total Protein: 6.6 g/dL (ref 6.4–8.3)

## 2017-05-27 NOTE — Progress Notes (Signed)
Hematology and Oncology Follow Up Visit  [name] [id number] 07-01-1945 71 y.o. 05/27/2017 9:51 AM [name], MDAvbuere, Christean Grief, MD   Principle Diagnosis: 72 year old man with bladder cancer diagnosed in March 2018.  He presented with a multifocal muscle invasive T2N0 high-grade urothelial carcinoma.   Prior Therapy: He is status post TURBT done on March 15, 2017.  Current therapy: Radiation and weekly carboplatin.  He is completing 6 weeks of therapy his last carboplatin completed on May 16.  Interim History: [name] is here for a follow-up.  Since the last visit, he completed 6 weeks of carboplatin therapy without any complications.  He denied any nausea or infusion related complications.  He denies any worsening neuropathy.  He does report a dysuria and occasional constipation but no diarrhea or hematuria.  His appetite remain excellent and his weight is stable.  He still ambulates with the help of a cane and has not reported any falls or syncope.  He does not report any headaches, blurry vision, or seizures. Does not report any fevers, chills or sweats.  Does not report any cough, wheezing or hemoptysis.  Does not report any chest pain, palpitation, orthopnea or leg edema.  Does not report any nausea, vomiting or abdominal pain.    He denies any hematochezia or melena. Does not report any arthralgias or myalgias.    Does not report frequency, urgency or hematuria.  Does not report any skin rashes or lesions.  Does not report any lymphadenopathy or petechiae.  Does not report any anxiety or depression.  Remaining review of systems is negative.    Medications: I have reviewed the patient's current medications.  Current Outpatient Medications  Medication Sig Dispense Refill  . acetaminophen (TYLENOL) 500 MG tablet Take 500 mg by mouth every 8 (eight) hours as needed for mild pain, moderate pain, fever or headache.    . albuterol (PROVENTIL HFA) 108 (90 BASE) MCG/ACT inhaler Inhale 2 puffs  into the lungs 2 (two) times daily. 1 Inhaler 1  . albuterol (PROVENTIL) (2.5 MG/3ML) 0.083% nebulizer solution Take 2.5 mg by nebulization every 6 (six) hours as needed for wheezing or shortness of breath.    [name] amLODipine (NORVASC) 10 MG tablet     . amLODipine (NORVASC) 5 MG tablet TAKE 1 TABLET (5 MG TOTAL) BY MOUTH DAILY. 30 tablet 9  . aspirin EC 81 MG tablet Take 81 mg by mouth daily.    [name] ibuprofen (ADVIL,MOTRIN) 200 MG tablet Take 200 mg by mouth every 8 (eight) hours as needed for fever, headache, mild pain, moderate pain or cramping.    . metFORMIN (GLUCOPHAGE) 500 MG tablet Take 500 mg by mouth 2 (two) times daily with a meal.     . nitroGLYCERIN (NITROSTAT) 0.4 MG SL tablet Place 1 tablet (0.4 mg total) under the tongue every 5 (five) minutes as needed for chest pain. 25 tablet 3  . omeprazole (PRILOSEC) 20 MG capsule Take 1 capsule (20 mg total) by mouth 2 (two) times daily before a meal. 60 capsule 6  . phenazopyridine (PYRIDIUM) 200 MG tablet Take 1 tablet (200 mg total) by mouth 3 (three) times daily with meals. 40 tablet 1  . potassium chloride SA (K-DUR,KLOR-CON) 20 MEQ tablet Take 1 tablet (20 mEq total) by mouth daily. 14 tablet 0  . prochlorperazine (COMPAZINE) 10 MG tablet TAKE 1 TABLET (10 MG TOTAL) BY MOUTH EVERY 6 (SIX) HOURS AS NEEDED FOR NAUSEA OR VOMITING. 30 tablet 0  . rosuvastatin (CRESTOR) 40 MG  tablet TAKE 1 TABLET BY MOUTH EVERY DAY 90 tablet 1  . tamsulosin (FLOMAX) 0.4 MG CAPS capsule Take 2 capsules (0.8 mg total) by mouth every evening. (Patient taking differently: Take 0.4 mg by mouth every evening. ) 30 capsule 2  . tiotropium (SPIRIVA HANDIHALER) 18 MCG inhalation capsule Place 1 capsule (18 mcg total) into inhaler and inhale daily. 30 capsule 1  . TOVIAZ 4 MG TB24 tablet Take 4 mg by mouth every evening.  5  . Vitamin D, Ergocalciferol, (DRISDOL) 50000 units CAPS capsule 1 PO ONCE A WEEK  5   No current facility-administered medications for this visit.       Allergies: No Known Allergies  Past Medical History, Surgical history, Social history, and Family History were reviewed and updated.    Physical Exam: Blood pressure 128/60, pulse 96, temperature 98.5 F (36.9 C), temperature source Oral, resp. rate 18, height 5\' 4"  (1.626 m), weight 147 lb 6.4 oz (66.9 kg), SpO2 99 %.   ECOG: 1 General appearance: Well-appearing gentleman without distress. Head: Traumatic without abnormalities. Oropharynx: Mucous membranes are moist and pink. Eyes: Nipples are equal and round reactive to light without scleral icterus. Lymph nodes: No lymphadenopathy noted in the cervical, supraclavicular, and axillary nodes Heart: Regular rate without any murmurs or gallops. Lung: Clear to auscultation without any rhonchi, wheezes or dullness to percussion. Abdomin: Soft, nontender, nondistended without any rebound or guarding.  No shifting dullness or ascites. Neurological: Intact motor and sensory exam.  Intact deep tendon reflexes. Skin: Skin is moist without any rashes or lesions.  No ecchymosis or petechiae. Musculoskeletal: No clubbing or cyanosis.     Lab Results: Lab Results  Component Value Date   WBC 7.8 05/27/2017   HGB 13.3 05/27/2017   HCT 39.8 05/27/2017   MCV 85.0 05/27/2017   PLT 221 05/27/2017     Chemistry      Component Value Date/Time   NA 138 05/23/2017 0827   K 4.0 05/23/2017 0827   CL 106 05/23/2017 0827   CO2 23 05/23/2017 0827   BUN 21 05/23/2017 0827   CREATININE 0.89 05/23/2017 0827   CREATININE 0.75 04/08/2015 1049      Component Value Date/Time   CALCIUM 9.6 05/23/2017 0827   ALKPHOS 61 05/23/2017 0827   AST 22 05/23/2017 0827   ALT 33 05/23/2017 0827   BILITOT 0.4 05/23/2017 0827       Impression and Plan:  72 year old man with:  1.    Multifocal bladder cancer diagnosed in March 2019 with T2N0 high-grade urothelial carcinoma.   He is completing definitive therapy with radiation in addition to 6  weeks of carboplatin that was concluded on May 23, 2017.  He denies any residual toxicities to this treatment and have tolerated reasonably well.  His laboratory data did not show any cytopenias or long-term complications related to chemotherapy.  The natural course of this disease was reviewed and as he is completing therapy he will likely need active surveillance afterwards.  We will set him up with a CT scan to complete in July 2019 and he will have repeat cystoscopies in the future.   2.  IV access: No issues using his peripheral veins at this time.  Therapy concluded and no IV access is needed.  3.  Renal function surveillance on chemotherapy: His creatinine remains stable with kidney function intact.  No damage detected related to chemotherapy.  4.  Antiemetics: No nausea or vomiting reported acute or delayed at this time.  6.  Dysuria: Related to his bladder tumor and treatment.  No evidence of infection noted.  5.  Follow-up:  Be in July 2019 after repeating imaging studies.  15  minutes was spent with the patient face-to-face today.  More than 50% of time was dedicated to patient counseling, education and coordinating his future plan of care.   [name], MD 5/20/20199:51 AM

## 2017-05-27 NOTE — Telephone Encounter (Signed)
Printed avs and calender and also gave patient contrast, copies of instruction completely fill out. Called and scheduled ct due to interp. Would be needed for sch. Per 5/20 los

## 2017-05-28 ENCOUNTER — Ambulatory Visit
Admission: RE | Admit: 2017-05-28 | Discharge: 2017-05-28 | Disposition: A | Discharge status: Home / Self Care | Payer: Medicare HMO | Source: Ambulatory Visit | Attending: Radiation Oncology | Admitting: Radiation Oncology

## 2017-05-28 ENCOUNTER — Encounter: Payer: Self-pay | Admitting: Radiation Oncology

## 2017-05-28 DIAGNOSIS — C672 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: Secondary | ICD-10-CM | POA: Diagnosis not present

## 2017-05-28 DIAGNOSIS — Z510 Encounter for antineoplastic radiation therapy: Secondary | ICD-10-CM | POA: Diagnosis not present

## 2017-05-29 DIAGNOSIS — I10 Essential (primary) hypertension: Secondary | ICD-10-CM | POA: Diagnosis not present

## 2017-05-29 DIAGNOSIS — K5900 Constipation, unspecified: Secondary | ICD-10-CM | POA: Diagnosis not present

## 2017-05-29 DIAGNOSIS — J449 Chronic obstructive pulmonary disease, unspecified: Secondary | ICD-10-CM | POA: Diagnosis not present

## 2017-05-29 DIAGNOSIS — E1165 Type 2 diabetes mellitus with hyperglycemia: Secondary | ICD-10-CM | POA: Diagnosis not present

## 2017-05-29 DIAGNOSIS — C679 Malignant neoplasm of bladder, unspecified: Secondary | ICD-10-CM | POA: Diagnosis not present

## 2017-05-30 ENCOUNTER — Other Ambulatory Visit: Payer: Medicare HMO

## 2017-05-30 ENCOUNTER — Ambulatory Visit: Payer: Medicare HMO

## 2017-06-10 NOTE — Progress Notes (Signed)
  Radiation Oncology         [phone] ________________________________  Name: [name] MRN: [id number]  Date: 05/28/2017  DOB: October 05, 1945  End of Treatment Note  Diagnosis:   72 y.o. male with Stage II, [id number] Multifocal High grade urothelial carcinoma with involvement of the muscularis propria along the left trigone and right ureteral orifice     Indication for treatment:  Curative       Radiation treatment dates:   04/17/2017 - 05/28/2017  Site/dose:   The bladder was initially treated to 44 Gy in 22 fractions, followed by a 16 Gy boost in 8 fractions, to yield a total dose of 60 Gy.  Beams/energy:   3D / 15X Photon  Narrative: The patient tolerated radiation treatment relatively well. He did experience some dysuria and increased frequency with little output. His urinary symptoms were improved with Pyridium. He also complained of bloating and constipation that he managed with Miralax.  Plan: The patient has completed radiation treatment. The patient will return to radiation oncology clinic for routine followup in one month. I advised them to call or return sooner if they have any questions or concerns related to their recovery or treatment.  ------------------------------------------------  Jodelle Gross, MD, PhD  This document serves as a record of services personally performed by Kyung Rudd, MD. It was created on his behalf by Rae Lips, a trained medical scribe. The creation of this record is based on the scribe's personal observations and the provider's statements to them. This document has been checked and approved by the attending provider.

## 2017-06-17 ENCOUNTER — Emergency Department (HOSPITAL_COMMUNITY): Payer: Medicare HMO

## 2017-06-17 ENCOUNTER — Emergency Department (HOSPITAL_COMMUNITY)
Admission: EM | Admit: 2017-06-17 | Discharge: 2017-06-17 | Disposition: A | Discharge status: Home / Self Care | Payer: Medicare HMO | Attending: Physician Assistant | Admitting: Physician Assistant

## 2017-06-17 ENCOUNTER — Other Ambulatory Visit: Payer: Self-pay

## 2017-06-17 ENCOUNTER — Encounter (HOSPITAL_COMMUNITY): Payer: Self-pay

## 2017-06-17 DIAGNOSIS — R3989 Other symptoms and signs involving the genitourinary system: Secondary | ICD-10-CM | POA: Insufficient documentation

## 2017-06-17 DIAGNOSIS — Z87891 Personal history of nicotine dependence: Secondary | ICD-10-CM | POA: Diagnosis not present

## 2017-06-17 DIAGNOSIS — Z79899 Other long term (current) drug therapy: Secondary | ICD-10-CM | POA: Diagnosis not present

## 2017-06-17 DIAGNOSIS — I10 Essential (primary) hypertension: Secondary | ICD-10-CM | POA: Insufficient documentation

## 2017-06-17 DIAGNOSIS — Z7984 Long term (current) use of oral hypoglycemic drugs: Secondary | ICD-10-CM | POA: Insufficient documentation

## 2017-06-17 DIAGNOSIS — E119 Type 2 diabetes mellitus without complications: Secondary | ICD-10-CM | POA: Insufficient documentation

## 2017-06-17 DIAGNOSIS — R102 Pelvic and perineal pain: Secondary | ICD-10-CM | POA: Insufficient documentation

## 2017-06-17 DIAGNOSIS — J449 Chronic obstructive pulmonary disease, unspecified: Secondary | ICD-10-CM | POA: Insufficient documentation

## 2017-06-17 DIAGNOSIS — I2510 Atherosclerotic heart disease of native coronary artery without angina pectoris: Secondary | ICD-10-CM | POA: Diagnosis not present

## 2017-06-17 DIAGNOSIS — C679 Malignant neoplasm of bladder, unspecified: Secondary | ICD-10-CM | POA: Diagnosis not present

## 2017-06-17 DIAGNOSIS — N368 Other specified disorders of urethra: Secondary | ICD-10-CM | POA: Diagnosis not present

## 2017-06-17 DIAGNOSIS — Z7982 Long term (current) use of aspirin: Secondary | ICD-10-CM | POA: Insufficient documentation

## 2017-06-17 LAB — COMPREHENSIVE METABOLIC PANEL
ALT: 47 U/L (ref 17–63)
AST: 35 U/L (ref 15–41)
Albumin: 3.9 g/dL (ref 3.5–5.0)
Alkaline Phosphatase: 51 U/L (ref 38–126)
Anion gap: 8 (ref 5–15)
BILIRUBIN TOTAL: 0.5 mg/dL (ref 0.3–1.2)
BUN: 26 mg/dL — AB (ref 6–20)
CO2: 26 mmol/L (ref 22–32)
CREATININE: 0.92 mg/dL (ref 0.61–1.24)
Calcium: 8.4 mg/dL — ABNORMAL LOW (ref 8.9–10.3)
Chloride: 105 mmol/L (ref 101–111)
Glucose, Bld: 267 mg/dL — ABNORMAL HIGH (ref 65–99)
POTASSIUM: 4.8 mmol/L (ref 3.5–5.1)
Sodium: 139 mmol/L (ref 135–145)
TOTAL PROTEIN: 6.3 g/dL — AB (ref 6.5–8.1)

## 2017-06-17 LAB — CBC WITH DIFFERENTIAL/PLATELET
BASOS ABS: 0 10*3/uL (ref 0.0–0.1)
Basophils Relative: 0 %
EOS ABS: 0 10*3/uL (ref 0.0–0.7)
EOS PCT: 1 %
HCT: 39.5 % (ref 39.0–52.0)
Hemoglobin: 13 g/dL (ref 13.0–17.0)
LYMPHS PCT: 12 %
Lymphs Abs: 0.5 10*3/uL — ABNORMAL LOW (ref 0.7–4.0)
MCH: 29.7 pg (ref 26.0–34.0)
MCHC: 32.9 g/dL (ref 30.0–36.0)
MCV: 90.2 fL (ref 78.0–100.0)
Monocytes Absolute: 0.5 10*3/uL (ref 0.1–1.0)
Monocytes Relative: 12 %
NEUTROS PCT: 75 %
Neutro Abs: 2.9 10*3/uL (ref 1.7–7.7)
PLATELETS: 241 10*3/uL (ref 150–400)
RBC: 4.38 MIL/uL (ref 4.22–5.81)
RDW: 19 % — ABNORMAL HIGH (ref 11.5–15.5)
WBC: 3.9 10*3/uL — ABNORMAL LOW (ref 4.0–10.5)

## 2017-06-17 LAB — URINALYSIS, ROUTINE W REFLEX MICROSCOPIC
Bilirubin Urine: NEGATIVE
Glucose, UA: 50 mg/dL — AB
KETONES UR: NEGATIVE mg/dL
Nitrite: NEGATIVE
PH: 8 (ref 5.0–8.0)
Protein, ur: 100 mg/dL — AB
Specific Gravity, Urine: 1.016 (ref 1.005–1.030)

## 2017-06-17 LAB — I-STAT TROPONIN, ED: Troponin i, poc: 0 ng/mL (ref 0.00–0.08)

## 2017-06-17 LAB — PROTIME-INR
INR: 0.88
PROTHROMBIN TIME: 11.8 s (ref 11.4–15.2)

## 2017-06-17 LAB — I-STAT CG4 LACTIC ACID, ED: LACTIC ACID, VENOUS: 1.91 mmol/L — AB (ref 0.5–1.9)

## 2017-06-17 MED ORDER — IOPAMIDOL (ISOVUE-300) INJECTION 61%
INTRAVENOUS | Status: AC
  Filled 2017-06-17: qty 100

## 2017-06-17 MED ORDER — IOPAMIDOL (ISOVUE-300) INJECTION 61%
100.0000 mL | Freq: Once | INTRAVENOUS | Status: AC | PRN
  Administered 2017-06-17: 100 mL via INTRAVENOUS

## 2017-06-17 NOTE — ED Notes (Signed)
Patient transported to CT 

## 2017-06-17 NOTE — ED Notes (Signed)
Called lab they are adding urine culture.

## 2017-06-17 NOTE — Discharge Instructions (Addendum)
We will you develop follow-up with your oncologist, Dr. [name] as soon as possible.  He may be able to help you with this urethral pain that is been going on since radiation.  We are happy to report that your labs were reassuring.

## 2017-06-17 NOTE — ED Triage Notes (Signed)
Patient c/o bladder pain when he urinates since 05/28/17. Patient states worsening pain. Patient denies any hematuria. Patient also c/o dizziness.

## 2017-06-17 NOTE — ED Provider Notes (Addendum)
[facility] DEPT Provider Note   CSN: [id number] Arrival date & time: 06/17/17  1628     History   Chief Complaint Chief Complaint  Patient presents with  . bladder pain  . chemo patient    HPI [name] is a 72 y.o. male.  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  Patient presents today with abdominal pain.  Is unable to give any kind of concise past medical history about when it started or what it feels like.  He reports that he had bladder pain since "the cancer started".  He reports is worse today.  Patient has tenderness in his bladder.  He does cannot report to me with his having trouble urinating or not.  He denies any fever.  Denies nausea.  Endorses diffuse abdominal pain.  He also reports there is some pain in his bilateral back over the CVA areas.  Past Medical History:  Diagnosis Date  . Allergic rhinitis   . CAD (coronary artery disease), native coronary artery cardiologist-  [name]   a. thought due to vasospasm . cath - CAD LAD 40-50%, LCX 40%, RCA 25% EF 65%. lexiscan myoview EF 68% w/o evidence of ischemia or infarct b. STEMI s/p BMS-mid RCA c. BMS x 2- prox RCA, PLOM  . 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 COPD, group C, by GOLD 2017 classification [facility]) pulmonologist-- dr mcquid   former smoker--- severe OSA w/ hx multiple hospital admissions w/ exacerbation's  . Dyspnea   . Dysuria   . Full dentures   . GERD (gastroesophageal reflux disease)   . History of non-ST elevation myocardial infarction (NSTEMI) 06/19/2009   in setting COPD w/ exacerbation;  negative myoview for ischemia;  previously had cardiac cath 06-08-2009 w/ diffuse nonobtructive CAD and  normal LVF  . History of prostatitis   . History of ST elevation myocardial infarction (STEMI) 02/15/2012   in setting of COPD w/  exacerbation; per cardiac cath PCI and stenting to RCA  . HTN (hypertension)   . Hyperlipidemia   . Lower abdominal pain   . Recurrent malignant neoplasm of bladder [facility]) urologist-  dr [name]   previously dx 08/ 2018 and 10/ 2018 w/ high grade urothelial carcinoma with no muscle involvement  . Type 2 diabetes mellitus ([place])   . Wears glasses     Patient Active Problem List   Diagnosis Date Noted  . 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 ([place]) 03/15/2017  . Sepsis ([place]) 08/03/2016  . Hematuria 08/03/2016  . Diabetes mellitus, type 2 ([place]) 06/01/2015  . Acute on chronic respiratory failure with hypercapnia ([place])   . Diabetes mellitus with complication ([place])   . Dysuria 03/01/2015  . Type 2 diabetes mellitus with circulatory disorder ([place])   . Hypokalemia   . PVC's (premature ventricular contractions)   . Shortness of breath 05/01/2014  . SIRS (systemic inflammatory response syndrome) ([place]) 05/01/2014  . COPD exacerbation ([place]) 05/01/2014  . HTN (hypertension) 05/01/2014  . CAD (coronary artery disease), native coronary artery 05/01/2014  . Acute respiratory failure with hypoxia ([place]) 05/01/2014  . Uncontrolled diabetes mellitus without complication ([place]) 24/40/1027  . SOB (shortness of breath)   . Blood poisoning   . Chest pain, negative MI, secondary due to COPD exacerbation  08/04/2013  . Abnormal stress test 07/02/2012  . STEMI (ST elevation myocardial infarction) ([place])  02/17/2012  . Hyperlipidemia 02/17/2012  . Essential hypertension 02/17/2012  . Tobacco abuse, has stopped 02/17/2012  . Allergic rhinitis 02/17/2012  . Coronary atherosclerosis 06/14/2009  . COPD with exacerbation ([place]) 05/31/2009  . OBSTRUCTIVE CHRONIC BRONCHITIS 11/13/2006  . GERD 11/13/2006    Past Surgical History:  Procedure Laterality Date  . CARDIAC CATHETERIZATION N/A 12/27/2014   Procedure: Left Heart Cath and Coronary Angiography;  Surgeon: Jettie Booze, MD;  Location: [facility];  Service: Cardiovascular;  Laterality: N/A; ost.LAD to pLAD 40%, ost.D2 to D2 80%, mCFx 50%, mild to moderate left sided disease, patent RCA stents, normal LVF  . CARDIAC CATHETERIZATION  06-08-2009   dr cooper   diffuse non-obstructive CAD , normal LVF  . CARDIOVASCULAR STRESS TEST  06-25-2012  [name]   intermediate risk nuclear stress study w/ medium-sized, mild primary reversible basal to mid inferior perfusion defect suggestive of ischemia/  normal LV function and wall motion ,ef 63%/  pt scheduled for cardiac cath  . CYSTOSCOPY W/ RETROGRADES Bilateral 09/07/2016   Procedure: CYSTOSCOPY WITH BILATERAL RETROGRADE PYELOGRAM;  Surgeon: [name], MD;  Location: [facility];  Service: Urology;  Laterality: Bilateral;  . CYSTOSCOPY W/ RETROGRADES Bilateral 03/15/2017   Procedure: CYSTOSCOPY WITH RETROGRADE PYELOGRAM;  Surgeon: [name], MD;  Location: [facility];  Service: Urology;  Laterality: Bilateral;  . CYSTOSCOPY WITH RETROGRADE PYELOGRAM, URETEROSCOPY AND STENT PLACEMENT Left 10/19/2016   Procedure: CYSTOSCOPY WITH RETROGRADE PYELOGRAM, URETEROSCOPY AND STENT PLACEMENT;  Surgeon: [name], MD;  Location: [facility];  Service: Urology;  Laterality: Left;  . left foot surgery     repair - and ankle   . LEFT HEART CATHETERIZATION WITH CORONARY ANGIOGRAM N/A 02/15/2012   Procedure: LEFT HEART CATHETERIZATION WITH CORONARY ANGIOGRAM;  Surgeon: Peter M [name], MD;  Location: [facility] CATH LAB;  Service: Cardiovascular;  Laterality: N/A;  single vessel obstructive disease of the mid RCA, normal LVF  . LEFT HEART CATHETERIZATION WITH CORONARY ANGIOGRAM N/A 07/01/2012   Procedure: LEFT HEART CATHETERIZATION WITH CORONARY ANGIOGRAM;  Surgeon: Peter M [name], MD;  Location: [facility] CATH LAB;  Service: Cardiovascular;  Laterality: N/A;  class III angina and positive stress test; patent mRCA stent;  severe obstructive single vessel disease to new lesion proximal  RCA & PLOM branch; normal LVF  . LEFT HEART CATHETERIZATION WITH CORONARY ANGIOGRAM N/A 08/03/2013   Procedure: LEFT HEART CATHETERIZATION WITH CORONARY ANGIOGRAM;  Surgeon: Peter M [name], MD;  Location: [facility] CATH LAB;  Service: Cardiovascular;  Laterality: N/A;  nonobstuctive CAD, normal LVF, stents patent mRCA and pRCA, mild diffuse disease up to 30% of stent in the PLOM  . PERCUTANEOUS CORONARY STENT INTERVENTION (PCI-S) N/A 02/15/2012   Procedure: PERCUTANEOUS CORONARY STENT INTERVENTION (PCI-S);  Surgeon: Peter M [name], MD;  Location: [facility] CATH LAB;  Service: Cardiovascular;  Laterality: N/A;   BMS x1 to midRCA  . PERCUTANEOUS CORONARY STENT INTERVENTION (PCI-S)  07/01/2012   Procedure: PERCUTANEOUS CORONARY STENT INTERVENTION (PCI-S);  Surgeon: Peter M [name], MD;  Location: [facility] CATH LAB;  Service: Cardiovascular;;  BMS to pRCA; BMS to PLOM  . TRANSTHORACIC ECHOCARDIOGRAM  08/05/2016   moderate LVH, ef 82-42%, grade 1 diastolic dysfunction/ mild PR and TR, trivial pericardial effusion/   . TRANSURETHRAL RESECTION OF BLADDER TUMOR N/A 09/07/2016   Procedure: TRANSURETHRAL RESECTION OF BLADDER TUMOR (TURBT);  Surgeon: [name], MD;  Location: [facility];  Service: Urology;  Laterality: N/A;  . TRANSURETHRAL RESECTION OF BLADDER TUMOR  N/A 10/19/2016   Procedure: TRANSURETHRAL RESECTION OF BLADDER TUMOR (TURBT);  Surgeon: [name], MD;  Location: [facility];  Service: Urology;  Laterality: N/A;  . TRANSURETHRAL RESECTION OF BLADDER TUMOR N/A 03/15/2017   Procedure: TRANSURETHRAL RESECTION OF BLADDER TUMOR (TURBT);  Surgeon: [name], MD;  Location: [facility];  Service: Urology;  Laterality: N/A;        Home Medications    Prior to Admission medications   Medication Sig Start Date End Date Taking? Authorizing Provider  acetaminophen (TYLENOL) 500 MG tablet Take 500 mg by mouth every 8 (eight) hours as needed for mild pain, moderate pain, fever or headache.     [provider]  albuterol (PROVENTIL HFA) 108 (90 BASE) MCG/ACT inhaler Inhale 2 puffs into the lungs 2 (two) times daily. 05/06/14   Robbie Lis, MD  albuterol (PROVENTIL) (2.5 MG/3ML) 0.083% nebulizer solution Take 2.5 mg by nebulization every 6 (six) hours as needed for wheezing or shortness of breath.    [provider]  amLODipine (NORVASC) 10 MG tablet  04/07/17   [provider]  amLODipine (NORVASC) 5 MG tablet TAKE 1 TABLET (5 MG TOTAL) BY MOUTH DAILY. 06/07/15   [name], Peter M, MD  aspirin EC 81 MG tablet Take 81 mg by mouth daily.    [provider]  ibuprofen (ADVIL,MOTRIN) 200 MG tablet Take 200 mg by mouth every 8 (eight) hours as needed for fever, headache, mild pain, moderate pain or cramping.    [provider]  metFORMIN (GLUCOPHAGE) 500 MG tablet Take 500 mg by mouth 2 (two) times daily with a meal.     [provider]  nitroGLYCERIN (NITROSTAT) 0.4 MG SL tablet Place 1 tablet (0.4 mg total) under the tongue every 5 (five) minutes as needed for chest pain. 01/05/15   [name], PA  omeprazole (PRILOSEC) 20 MG capsule Take 1 capsule (20 mg total) by mouth 2 (two) times daily before a meal. 10/08/16   [name], Peter M, MD  phenazopyridine (PYRIDIUM) 200 MG tablet Take 1 tablet (200 mg total) by mouth 3 (three) times daily with meals. 05/15/17   Kyung Rudd, MD  potassium chloride SA (K-DUR,KLOR-CON) 20 MEQ tablet Take 1 tablet (20 mEq total) by mouth daily. 04/18/17   [name], MD  prochlorperazine (COMPAZINE) 10 MG tablet TAKE 1 TABLET (10 MG TOTAL) BY MOUTH EVERY 6 (SIX) HOURS AS NEEDED FOR NAUSEA OR VOMITING. 04/18/17   [name], MD  rosuvastatin (CRESTOR) 40 MG tablet TAKE 1 TABLET BY MOUTH EVERY DAY 02/04/17   [name], PA  tamsulosin (FLOMAX) 0.4 MG CAPS capsule Take 2 capsules (0.8 mg total) by mouth every evening. Patient taking differently: Take 0.4 mg by mouth every evening.  08/08/16   Waldron Session, MD    tiotropium (SPIRIVA HANDIHALER) 18 MCG inhalation capsule Place 1 capsule (18 mcg total) into inhaler and inhale daily. 05/06/14   Robbie Lis, MD  TOVIAZ 4 MG TB24 tablet Take 4 mg by mouth every evening. 07/25/16   [provider]  Vitamin D, Ergocalciferol, (DRISDOL) 50000 units CAPS capsule 1 PO ONCE A WEEK 03/28/17   [provider]  budesonide (PULMICORT) 0.5 MG/2ML nebulizer solution Take 2 mLs (0.5 mg total) by nebulization 2 (two) times daily. DX:  496 02/07/11 03/21/11  Parrett, Fonnie Mu, NP  esomeprazole (NEXIUM) 40 MG capsule Take 1 capsule (40 mg total) by mouth daily. 12/28/10 03/21/11  Elsie Stain, MD  Family History Family History  Problem Relation Age of Onset  . CAD Neg Hx        no known FHx of early CAD    Social History Social History   Tobacco Use  . Smoking status: Former Smoker    Packs/day: 1.00    Years: 50.00    Pack years: 50.00    Types: Cigarettes    Last attempt to quit: 01/09/2008    Years since quitting: 9.4  . Smokeless tobacco: Never Used  . Tobacco comment: rolled own cigarettes since age of 58,   Substance Use Topics  . Alcohol use: No  . Drug use: No     Allergies   Patient has no known allergies.   Review of Systems Review of Systems  Constitutional: Negative for activity change.  Respiratory: Negative for shortness of breath.   Cardiovascular: Negative for chest pain.  Gastrointestinal: Negative for abdominal pain.  Genitourinary: Positive for flank pain.     Physical Exam Updated Vital Signs BP (!) 144/77 (BP Location: Left Arm)   Pulse 95   Temp 98.9 F (37.2 C) (Oral)   Resp 17   Ht 5\' 4"  (1.626 m)   Wt 66.7 kg (147 lb)   SpO2 96%   BMI 25.23 kg/m   Physical Exam  Constitutional: He is oriented to person, place, and time. He appears well-nourished.  HENT:  Head: Normocephalic.  Eyes: Conjunctivae are normal.  Cardiovascular: Normal rate and regular rhythm.  No murmur  heard. Pulmonary/Chest: Effort normal and breath sounds normal. No respiratory distress.  Abdominal: Soft. He exhibits no distension. There is no tenderness.  Mild distension, no focal tenderness  Neurological: He is oriented to person, place, and time. No cranial nerve deficit.  Skin: Skin is warm and dry. He is not diaphoretic.  Psychiatric: He has a normal mood and affect. His behavior is normal.     ED Treatments / Results  Labs (all labs ordered are listed, but only abnormal results are displayed) Labs Reviewed  URINALYSIS, ROUTINE W REFLEX MICROSCOPIC - Abnormal; Notable for the following components:      Result Value   Glucose, UA 50 (*)    Hgb urine dipstick SMALL (*)    Protein, ur 100 (*)    Leukocytes, UA TRACE (*)    Bacteria, UA MANY (*)    All other components within normal limits  COMPREHENSIVE METABOLIC PANEL  CBC WITH DIFFERENTIAL/PLATELET  PROTIME-INR  I-STAT CG4 LACTIC ACID, ED  I-STAT TROPONIN, ED    EKG None  Radiology No results found.  Procedures Procedures (including critical care time)  Medications Ordered in ED Medications - No data to display   Initial Impression / Assessment and Plan / ED Course  I have reviewed the triage vital signs and the nursing notes.  Pertinent labs & imaging results that were available during my care of the patient were reviewed by me and considered in my medical decision making (see chart for details).     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  Patient presents today with abdominal pain.  Is unable to give any kind of concise past medical history about when it started or what it feels like.  He reports that he had bladder pain since "the cancer started".  He reports is worse today.  Patient has tenderness in his bladder.  He does cannot report to  me with his having  trouble urinating or not.  He denies any fever.  Denies nausea.  Endorses diffuse abdominal pain.  He also reports there is some pain in his bilateral back over the CVA areas.   9:19 PM Went back to talk to patient again.  He really thinks that the pain is been going on since his last radiation treatment ending in late May.  I think that this could be radiation related pain.  Patient's postvoid residual was less than 50.  Patient CT shows no interveneable issues.  Blood in the urine likely because of radiation.  Patient states that he really just has pain right when he urinates.  Not otherwise.  He sometimes has pain when he defecates to.  I think this has to do with radiation to that area.  he denies any weakness, numbness, difficulty with bowel or bladder control.  We will have him follow-up with his oncologist as soon as possible.  Patient appears very well, resting comfortably in bed.  Normal vital signs and reassuring labs with a reassuring CAT scan.  Final Clinical Impressions(s) / ED Diagnoses   Final diagnoses:  None    ED Discharge Orders    None       Macarthur Critchley, MD 06/17/17 2120    Macarthur Critchley, MD 06/17/17 2123

## 2017-06-17 NOTE — ED Notes (Signed)
Bladder scan- 26mL, Dr Thomasene Lot aware

## 2017-06-19 LAB — URINE CULTURE: Culture: <10000 — AB

## 2017-06-24 ENCOUNTER — Emergency Department (HOSPITAL_COMMUNITY)
Admission: EM | Admit: 2017-06-24 | Discharge: 2017-06-24 | Disposition: A | Discharge status: Home / Self Care | Payer: Medicare HMO | Attending: Emergency Medicine | Admitting: Emergency Medicine

## 2017-06-24 ENCOUNTER — Encounter (HOSPITAL_COMMUNITY): Payer: Self-pay | Admitting: *Deleted

## 2017-06-24 DIAGNOSIS — R350 Frequency of micturition: Secondary | ICD-10-CM

## 2017-06-24 DIAGNOSIS — I2510 Atherosclerotic heart disease of native coronary artery without angina pectoris: Secondary | ICD-10-CM | POA: Diagnosis not present

## 2017-06-24 DIAGNOSIS — Z7984 Long term (current) use of oral hypoglycemic drugs: Secondary | ICD-10-CM | POA: Insufficient documentation

## 2017-06-24 DIAGNOSIS — J449 Chronic obstructive pulmonary disease, unspecified: Secondary | ICD-10-CM | POA: Insufficient documentation

## 2017-06-24 DIAGNOSIS — Z87891 Personal history of nicotine dependence: Secondary | ICD-10-CM | POA: Insufficient documentation

## 2017-06-24 DIAGNOSIS — I10 Essential (primary) hypertension: Secondary | ICD-10-CM | POA: Insufficient documentation

## 2017-06-24 DIAGNOSIS — Z7982 Long term (current) use of aspirin: Secondary | ICD-10-CM | POA: Diagnosis not present

## 2017-06-24 DIAGNOSIS — N3001 Acute cystitis with hematuria: Secondary | ICD-10-CM | POA: Diagnosis not present

## 2017-06-24 DIAGNOSIS — E1159 Type 2 diabetes mellitus with other circulatory complications: Secondary | ICD-10-CM | POA: Diagnosis not present

## 2017-06-24 DIAGNOSIS — Z79899 Other long term (current) drug therapy: Secondary | ICD-10-CM | POA: Insufficient documentation

## 2017-06-24 LAB — CBC
HCT: 41 % (ref 39.0–52.0)
HEMOGLOBIN: 13.7 g/dL (ref 13.0–17.0)
MCH: 30.2 pg (ref 26.0–34.0)
MCHC: 33.4 g/dL (ref 30.0–36.0)
MCV: 90.3 fL (ref 78.0–100.0)
Platelets: 289 10*3/uL (ref 150–400)
RBC: 4.54 MIL/uL (ref 4.22–5.81)
RDW: 17.8 % — ABNORMAL HIGH (ref 11.5–15.5)
WBC: 6.8 10*3/uL (ref 4.0–10.5)

## 2017-06-24 LAB — BASIC METABOLIC PANEL
Anion gap: 10 (ref 5–15)
BUN: 18 mg/dL (ref 6–20)
CHLORIDE: 107 mmol/L (ref 101–111)
CO2: 25 mmol/L (ref 22–32)
Calcium: 9.7 mg/dL (ref 8.9–10.3)
Creatinine, Ser: 0.94 mg/dL (ref 0.61–1.24)
GFR calc Af Amer: >60 mL/min (ref >60)
GFR calc non Af Amer: >60 mL/min (ref >60)
GLUCOSE: 121 mg/dL — AB (ref 65–99)
POTASSIUM: 4.2 mmol/L (ref 3.5–5.1)
SODIUM: 142 mmol/L (ref 135–145)

## 2017-06-24 LAB — URINALYSIS, ROUTINE W REFLEX MICROSCOPIC
Bilirubin Urine: NEGATIVE
Glucose, UA: NEGATIVE mg/dL
Hgb urine dipstick: NEGATIVE
Ketones, ur: 5 mg/dL — AB
Nitrite: NEGATIVE
PH: 5 (ref 5.0–8.0)
Protein, ur: >=300 mg/dL — AB
SPECIFIC GRAVITY, URINE: 1.027 (ref 1.005–1.030)
WBC, UA: >50 WBC/hpf — ABNORMAL HIGH (ref 0–5)

## 2017-06-24 MED ORDER — CEPHALEXIN 500 MG PO CAPS
500.0000 mg | ORAL_CAPSULE | Freq: Three times a day (TID) | ORAL | 0 refills | Status: DC
Start: 2017-06-24 — End: 2017-07-28

## 2017-06-24 MED ORDER — CEPHALEXIN 500 MG PO CAPS
500.0000 mg | ORAL_CAPSULE | Freq: Once | ORAL | Status: AC
  Administered 2017-06-24: 500 mg via ORAL
  Filled 2017-06-24: qty 1

## 2017-06-24 MED ORDER — OXYCODONE-ACETAMINOPHEN 5-325 MG PO TABS
1.0000 | ORAL_TABLET | Freq: Once | ORAL | Status: AC
  Administered 2017-06-24: 1 via ORAL
  Filled 2017-06-24: qty 1

## 2017-06-24 NOTE — ED Provider Notes (Signed)
[facility] DEPT Provider Note   CSN: [id number] Arrival date & time: 06/24/17  1000     History   Chief Complaint Chief Complaint  Patient presents with  . bladder pain    HPI [name] is a 72 y.o. male.  HPI Patient is a 72 year old male presents the emergency department complaints of ongoing urinary frequency and bladder pain.  He was seen on 06/17/2017 here in the emergency department underwent CT scan which was only consistent with his known bladder cancer.  He denies fevers or chills.  Denies nausea vomiting.     Past Medical History:  Diagnosis Date  . Allergic rhinitis   . CAD (coronary artery disease), native coronary artery cardiologist-  [name]   a. thought due to vasospasm . cath - CAD LAD 40-50%, LCX 40%, RCA 25% EF 65%. lexiscan myoview EF 68% w/o evidence of ischemia or infarct b. STEMI s/p BMS-mid RCA c. BMS x 2- prox RCA, PLOM  . 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 COPD, group C, by GOLD 2017 classification [facility]) pulmonologist-- dr mcquid   former smoker--- severe OSA w/ hx multiple hospital admissions w/ exacerbation's  . Dyspnea   . Dysuria   . Full dentures   . GERD (gastroesophageal reflux disease)   . History of non-ST elevation myocardial infarction (NSTEMI) 06/19/2009   in setting COPD w/ exacerbation;  negative myoview for ischemia;  previously had cardiac cath 06-08-2009 w/ diffuse nonobtructive CAD and  normal LVF  . History of prostatitis   . History of ST elevation myocardial infarction (STEMI) 02/15/2012   in setting of COPD w/ exacerbation; per cardiac cath PCI and stenting to RCA  . HTN (hypertension)   . Hyperlipidemia   . Lower abdominal pain   . Recurrent malignant neoplasm of bladder [facility]) urologist-  dr [name]   previously dx 08/ 2018 and 10/ 2018 w/ high grade urothelial carcinoma with no muscle involvement  . Type 2 diabetes mellitus ([place])   . Wears glasses     Patient Active Problem List   Diagnosis Date Noted  . 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 ([place]) 03/15/2017  . Sepsis ([place]) 08/03/2016  . Hematuria 08/03/2016  . Diabetes mellitus, type 2 ([place]) 06/01/2015  . Acute on chronic respiratory failure with hypercapnia ([place])   . Diabetes mellitus with complication ([place])   . Dysuria 03/01/2015  . Type 2 diabetes mellitus with circulatory disorder ([place])   . Hypokalemia   . PVC's (premature ventricular contractions)   . Shortness of breath 05/01/2014  . SIRS (systemic inflammatory response syndrome) ([place]) 05/01/2014  . COPD exacerbation ([place]) 05/01/2014  . HTN (hypertension) 05/01/2014  . CAD (coronary artery disease), native coronary artery 05/01/2014  . Acute respiratory failure with hypoxia ([place]) 05/01/2014  . Uncontrolled diabetes mellitus without complication ([place]) 27/06/2374  . SOB (shortness of breath)   . Blood poisoning   . Chest pain, negative MI, secondary due to COPD exacerbation  08/04/2013  . Abnormal stress test 07/02/2012  . STEMI (ST elevation myocardial infarction) ([place]) 02/17/2012  . Hyperlipidemia 02/17/2012  . Essential hypertension 02/17/2012  . Tobacco abuse, has stopped 02/17/2012  . Allergic rhinitis 02/17/2012  . Coronary atherosclerosis 06/14/2009  . COPD with exacerbation ([place]) 05/31/2009  . OBSTRUCTIVE CHRONIC BRONCHITIS 11/13/2006  . GERD 11/13/2006    Past Surgical History:  Procedure Laterality Date  . CARDIAC CATHETERIZATION N/A 12/27/2014   Procedure: Left Heart Cath and Coronary Angiography;  Surgeon: Jettie Booze, MD;  Location: [facility];  Service: Cardiovascular;  Laterality: N/A; ost.LAD to pLAD 40%, ost.D2 to D2 80%, mCFx 50%, mild to moderate left sided disease, patent RCA stents, normal LVF  . CARDIAC CATHETERIZATION  06-08-2009   dr cooper   diffuse non-obstructive CAD , normal LVF  . CARDIOVASCULAR STRESS TEST  06-25-2012  [name]   intermediate risk nuclear stress study w/ medium-sized, mild  primary reversible basal to mid inferior perfusion defect suggestive of ischemia/  normal LV function and wall motion ,ef 63%/  pt scheduled for cardiac cath  . CYSTOSCOPY W/ RETROGRADES Bilateral 09/07/2016   Procedure: CYSTOSCOPY WITH BILATERAL RETROGRADE PYELOGRAM;  Surgeon: [name], MD;  Location: [facility];  Service: Urology;  Laterality: Bilateral;  . CYSTOSCOPY W/ RETROGRADES Bilateral 03/15/2017   Procedure: CYSTOSCOPY WITH RETROGRADE PYELOGRAM;  Surgeon: [name], MD;  Location: [facility];  Service: Urology;  Laterality: Bilateral;  . CYSTOSCOPY WITH RETROGRADE PYELOGRAM, URETEROSCOPY AND STENT PLACEMENT Left 10/19/2016   Procedure: CYSTOSCOPY WITH RETROGRADE PYELOGRAM, URETEROSCOPY AND STENT PLACEMENT;  Surgeon: [name], MD;  Location: [facility];  Service: Urology;  Laterality: Left;  . left foot surgery     repair - and ankle   . LEFT HEART CATHETERIZATION WITH CORONARY ANGIOGRAM N/A 02/15/2012   Procedure: LEFT HEART CATHETERIZATION WITH CORONARY ANGIOGRAM;  Surgeon: Peter M [name], MD;  Location: [facility] CATH LAB;  Service: Cardiovascular;  Laterality: N/A;  single vessel obstructive disease of the mid RCA, normal LVF  . LEFT HEART CATHETERIZATION WITH CORONARY ANGIOGRAM N/A 07/01/2012   Procedure: LEFT HEART CATHETERIZATION WITH CORONARY ANGIOGRAM;  Surgeon: Peter M [name], MD;  Location: [facility] CATH LAB;  Service: Cardiovascular;  Laterality: N/A;  class III angina and positive stress test; patent mRCA stent;  severe obstructive single vessel disease to new lesion proximal RCA & PLOM branch; normal LVF  . LEFT HEART CATHETERIZATION WITH CORONARY ANGIOGRAM N/A 08/03/2013   Procedure: LEFT HEART CATHETERIZATION WITH CORONARY ANGIOGRAM;  Surgeon: Peter M [name], MD;  Location: [facility] CATH LAB;  Service: Cardiovascular;  Laterality: N/A;  nonobstuctive CAD, normal LVF, stents patent mRCA and pRCA, mild diffuse disease up to 30% of stent in the PLOM  . PERCUTANEOUS  CORONARY STENT INTERVENTION (PCI-S) N/A 02/15/2012   Procedure: PERCUTANEOUS CORONARY STENT INTERVENTION (PCI-S);  Surgeon: Peter M [name], MD;  Location: [facility] CATH LAB;  Service: Cardiovascular;  Laterality: N/A;   BMS x1 to midRCA  . PERCUTANEOUS CORONARY STENT INTERVENTION (PCI-S)  07/01/2012   Procedure: PERCUTANEOUS CORONARY STENT INTERVENTION (PCI-S);  Surgeon: Peter M [name], MD;  Location: [facility] CATH LAB;  Service: Cardiovascular;;  BMS to pRCA; BMS to PLOM  . TRANSTHORACIC ECHOCARDIOGRAM  08/05/2016   moderate LVH, ef 20-25%, grade 1 diastolic dysfunction/ mild PR and TR, trivial pericardial effusion/   . TRANSURETHRAL RESECTION OF BLADDER TUMOR N/A 09/07/2016   Procedure: TRANSURETHRAL RESECTION OF BLADDER TUMOR (TURBT);  Surgeon: [name], MD;  Location: [facility];  Service: Urology;  Laterality: N/A;  . TRANSURETHRAL RESECTION OF BLADDER TUMOR N/A 10/19/2016   Procedure: TRANSURETHRAL RESECTION OF BLADDER TUMOR (TURBT);  Surgeon: [name], MD;  Location: [facility];  Service: Urology;  Laterality: N/A;  . TRANSURETHRAL RESECTION OF BLADDER TUMOR N/A 03/15/2017   Procedure: TRANSURETHRAL RESECTION OF BLADDER TUMOR (TURBT);  Surgeon: [name], MD;  Location: [facility];  Service: Urology;  Laterality: N/A;        Home Medications    Prior  to Admission medications   Medication Sig Start Date End Date Taking? Authorizing Provider  acetaminophen (TYLENOL) 500 MG tablet Take 500 mg by mouth every 8 (eight) hours as needed for mild pain, moderate pain, fever or headache.   Yes [provider]  albuterol (PROVENTIL HFA) 108 (90 BASE) MCG/ACT inhaler Inhale 2 puffs into the lungs 2 (two) times daily. 05/06/14  Yes Robbie Lis, MD  albuterol (PROVENTIL) (2.5 MG/3ML) 0.083% nebulizer solution Take 2.5 mg by nebulization every 6 (six) hours as needed for wheezing or shortness of breath.   Yes [provider]  amLODipine (NORVASC) 10 MG tablet  Take 10 mg by mouth daily.  04/07/17  Yes [provider]  aspirin EC 81 MG tablet Take 81 mg by mouth daily.   Yes [provider]  folic acid (FOLVITE) 1 MG tablet Take 1 mg by mouth daily. 05/03/17  Yes [provider]  ibuprofen (ADVIL,MOTRIN) 200 MG tablet Take 200 mg by mouth every 8 (eight) hours as needed for fever, headache, mild pain, moderate pain or cramping.   Yes [provider]  LINZESS 72 MCG capsule Take 72 mcg by mouth every morning. 05/29/17  Yes [provider]  losartan (COZAAR) 50 MG tablet Take 50 mg by mouth daily. 06/08/17  Yes [provider]  metFORMIN (GLUCOPHAGE) 500 MG tablet Take 500 mg by mouth 2 (two) times daily with a meal.    Yes [provider]  nitroGLYCERIN (NITROSTAT) 0.4 MG SL tablet Place 1 tablet (0.4 mg total) under the tongue every 5 (five) minutes as needed for chest pain. 01/05/15  Yes [name], PA  omeprazole (PRILOSEC) 20 MG capsule Take 1 capsule (20 mg total) by mouth 2 (two) times daily before a meal. 10/08/16  Yes [name], Peter M, MD  phenazopyridine (PYRIDIUM) 200 MG tablet Take 1 tablet (200 mg total) by mouth 3 (three) times daily with meals. 05/15/17  Yes Kyung Rudd, MD  potassium chloride SA (K-DUR,KLOR-CON) 20 MEQ tablet Take 1 tablet (20 mEq total) by mouth daily. 04/18/17  Yes [name], MD  prochlorperazine (COMPAZINE) 10 MG tablet TAKE 1 TABLET (10 MG TOTAL) BY MOUTH EVERY 6 (SIX) HOURS AS NEEDED FOR NAUSEA OR VOMITING. 04/18/17  Yes [name], MD  rosuvastatin (CRESTOR) 20 MG tablet TAKE 20MG  1 TABLET DAILY FOR ELEVATED CHOLESTEROL 06/01/17  Yes [provider]  SYMBICORT 160-4.5 MCG/ACT inhaler TAKE 2 PUFFS BY MOUTH TWICE A DAY 05/29/17  Yes [provider]  tamsulosin (FLOMAX) 0.4 MG CAPS capsule Take 2 capsules (0.8 mg total) by mouth every evening. Patient taking differently: Take 0.4 mg by mouth every evening.  08/08/16  Yes Waldron Session, MD  tiotropium  (SPIRIVA HANDIHALER) 18 MCG inhalation capsule Place 1 capsule (18 mcg total) into inhaler and inhale daily. 05/06/14  Yes Robbie Lis, MD  TOVIAZ 8 MG TB24 tablet Take 8 mg by mouth daily.  06/16/17  Yes [provider]  Vitamin D, Ergocalciferol, (DRISDOL) 50000 units CAPS capsule 1 PO ONCE A WEEK 03/28/17  Yes [provider]  cephALEXin (KEFLEX) 500 MG capsule Take 1 capsule (500 mg total) by mouth 3 (three) times daily. 06/24/17   Jola Schmidt, MD  meloxicam (MOBIC) 15 MG tablet Take 15 mg by mouth daily. 05/07/17   [provider]  budesonide (PULMICORT) 0.5 MG/2ML nebulizer solution Take 2 mLs (0.5 mg total) by nebulization 2 (two) times daily. DX:  496 02/07/11 03/21/11  Parrett, Tammy  S, NP  esomeprazole (NEXIUM) 40 MG capsule Take 1 capsule (40 mg total) by mouth daily. 12/28/10 03/21/11  Elsie Stain, MD    Family History Family History  Problem Relation Age of Onset  . CAD Neg Hx        no known FHx of early CAD    Social History Social History   Tobacco Use  . Smoking status: Former Smoker    Packs/day: 1.00    Years: 50.00    Pack years: 50.00    Types: Cigarettes    Last attempt to quit: 01/09/2008    Years since quitting: 9.4  . Smokeless tobacco: Never Used  . Tobacco comment: rolled own cigarettes since age of 54,   Substance Use Topics  . Alcohol use: No  . Drug use: No     Allergies   Patient has no known allergies.   Review of Systems Review of Systems  All other systems reviewed and are negative.    Physical Exam Updated Vital Signs BP (!) 159/93 (BP Location: Right Arm)   Pulse 87   Temp 98.1 F (36.7 C)   Resp 13   SpO2 93%   Physical Exam  Constitutional: He is oriented to person, place, and time. He appears well-developed and well-nourished.  HENT:  Head: Normocephalic and atraumatic.  Eyes: EOM are normal.  Neck: Normal range of motion.  Cardiovascular: Normal rate, regular rhythm and normal heart sounds.    Pulmonary/Chest: Effort normal and breath sounds normal. No respiratory distress.  Abdominal: Soft. He exhibits no distension. There is no tenderness.  Musculoskeletal: Normal range of motion.  Neurological: He is alert and oriented to person, place, and time.  Skin: Skin is warm and dry.  Psychiatric: He has a normal mood and affect. Judgment normal.  Nursing note and vitals reviewed.    ED Treatments / Results  Labs (all labs ordered are listed, but only abnormal results are displayed) Labs Reviewed  URINALYSIS, ROUTINE W REFLEX MICROSCOPIC - Abnormal; Notable for the following components:      Result Value   APPearance HAZY (*)    Ketones, ur 5 (*)    Protein, ur >=300 (*)    Leukocytes, UA SMALL (*)    WBC, UA >50 (*)    Bacteria, UA FEW (*)    All other components within normal limits  CBC - Abnormal; Notable for the following components:   RDW 17.8 (*)    All other components within normal limits  BASIC METABOLIC PANEL - Abnormal; Notable for the following components:   Glucose, Bld 121 (*)    All other components within normal limits  URINE CULTURE    EKG None  Radiology No results found.  Procedures Procedures (including critical care time)  Medications Ordered in ED Medications  cephALEXin (KEFLEX) capsule 500 mg (has no administration in time range)  oxyCODONE-acetaminophen (PERCOCET/ROXICET) 5-325 MG per tablet 1 tablet (1 tablet Oral Given 06/24/17 1050)     Initial Impression / Assessment and Plan / ED Course  I have reviewed the triage vital signs and the nursing notes.  Pertinent labs & imaging results that were available during my care of the patient were reviewed by me and considered in my medical decision making (see chart for details).     Patient is overall well-appearing.  May represent urinary tract infection.  Urine culture sent.  Home with Keflex.  Patient instructed to follow-up with urology.  Final Clinical Impressions(s) / ED  Diagnoses  Final diagnoses:  Urinary frequency  Acute cystitis with hematuria    ED Discharge Orders        Ordered    cephALEXin (KEFLEX) 500 MG capsule  3 times daily     06/24/17 1338       Jola Schmidt, MD 06/24/17 1350

## 2017-06-24 NOTE — ED Notes (Signed)
Pt updated on plan of care and provided with water. Pt has no other complaints at this time.

## 2017-06-24 NOTE — ED Notes (Signed)
After urinating pt's bladder still had 95ML left.

## 2017-06-24 NOTE — ED Triage Notes (Signed)
Pt complains of bladder pain, worse with urination. Pt denies hematuria. Pt has hx of bladder cancer.

## 2017-06-25 LAB — URINE CULTURE: Culture: NO GROWTH

## 2017-07-02 ENCOUNTER — Other Ambulatory Visit: Payer: Self-pay

## 2017-07-02 ENCOUNTER — Encounter: Payer: Self-pay | Admitting: Radiation Oncology

## 2017-07-02 ENCOUNTER — Ambulatory Visit
Admission: RE | Admit: 2017-07-02 | Discharge: 2017-07-02 | Disposition: A | Discharge status: Home / Self Care | Payer: Medicare HMO | Source: Ambulatory Visit | Attending: Radiation Oncology | Admitting: Radiation Oncology

## 2017-07-02 VITALS — BP 155/88 | HR 95 | Temp 98.4°F | Resp 18 | Ht 64.0 in | Wt 147.4 lb

## 2017-07-02 DIAGNOSIS — Z79899 Other long term (current) drug therapy: Secondary | ICD-10-CM | POA: Insufficient documentation

## 2017-07-02 DIAGNOSIS — I700 Atherosclerosis of aorta: Secondary | ICD-10-CM | POA: Insufficient documentation

## 2017-07-02 DIAGNOSIS — C679 Malignant neoplasm of bladder, unspecified: Secondary | ICD-10-CM | POA: Diagnosis not present

## 2017-07-02 DIAGNOSIS — R300 Dysuria: Secondary | ICD-10-CM | POA: Diagnosis not present

## 2017-07-02 DIAGNOSIS — N281 Cyst of kidney, acquired: Secondary | ICD-10-CM | POA: Insufficient documentation

## 2017-07-02 DIAGNOSIS — C676 Malignant neoplasm of ureteric orifice: Secondary | ICD-10-CM | POA: Insufficient documentation

## 2017-07-02 DIAGNOSIS — C672 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: Secondary | ICD-10-CM

## 2017-07-02 DIAGNOSIS — C678 Malignant neoplasm of overlapping sites of bladder: Secondary | ICD-10-CM | POA: Diagnosis not present

## 2017-07-02 MED ORDER — PHENAZOPYRIDINE HCL 200 MG PO TABS: 200.0000 mg | ORAL_TABLET | Freq: Three times a day (TID) | ORAL | 1 refills | Status: DC

## 2017-07-02 MED ORDER — HYOSCYAMINE SULFATE 0.125 MG PO TABS: 0.1250 mg | ORAL_TABLET | ORAL | 1 refills | Status: DC | PRN

## 2017-07-02 NOTE — Addendum Note (Signed)
Encounter addended by: Malena Edman, RN on: 07/02/2017 10:53 AM  Actions taken: Charge Capture section accepted

## 2017-07-02 NOTE — Progress Notes (Signed)
Radiation Oncology         [phone] ________________________________  Name: [name] MRN: [id number]  Date of Service: 07/02/2017 DOB: April 24, 1945  Post Treatment Note  CC: [name], MD  [name], MD  Diagnosis:   Stage II, cT2N0M0 Multifocal High grade urothelial carcinoma with involvement of the muscularis propria along the left trigone and right ureteral orifice.  Interval Since Last Radiation:  5 weeks   04/17/2017 - 05/28/2017: The bladder was initially treated to 44 Gy in 22 fractions, followed by a 16 Gy boost in 8 fractions, to yield a total dose of 60 Gy.  Narrative:  The patient returns today for routine follow-up. He tolerated radiotherapy well however had 2 episodes of urinary infections in the midst of radiotherapy and was treated with antibiotics. He has been to the ED on two additional occasions since completing treatment according to his daughter and each time there were features of infection seen on UA but his recent culture on 06/24/17 was negative. He continues to have pelvic pressure and pain with urinating. He denies hematuria, and denies fevers. He reports he has had multiple episodes of feeling like he needed to empty his bladder but has dripping with his stream. He has tried AZO in the past without improvement. He does admit to improvement in prior hematuria and initially some pain improvement following radiotherapy but has had progressive pain despite antibiotics. He describes an aching pain in his pelvis as well.                      ALLERGIES:  has No Known Allergies.  Meds: Current Outpatient Medications  Medication Sig Dispense Refill  . acetaminophen (TYLENOL) 500 MG tablet Take 500 mg by mouth every 8 (eight) hours as needed for mild pain, moderate pain, fever or headache.    . albuterol (PROVENTIL HFA) 108 (90 BASE) MCG/ACT inhaler Inhale 2 puffs into the lungs 2 (two) times daily. 1 Inhaler 1  . albuterol (PROVENTIL) (2.5 MG/3ML) 0.083%  nebulizer solution Take 2.5 mg by nebulization every 6 (six) hours as needed for wheezing or shortness of breath.    [name] amLODipine (NORVASC) 10 MG tablet Take 10 mg by mouth daily.     [name] aspirin EC 81 MG tablet Take 81 mg by mouth daily.    . cephALEXin (KEFLEX) 500 MG capsule Take 1 capsule (500 mg total) by mouth 3 (three) times daily. 21 capsule 0  . folic acid (FOLVITE) 1 MG tablet Take 1 mg by mouth daily.  2  . ibuprofen (ADVIL,MOTRIN) 200 MG tablet Take 200 mg by mouth every 8 (eight) hours as needed for fever, headache, mild pain, moderate pain or cramping.    [name] LINZESS 72 MCG capsule Take 72 mcg by mouth every morning.  2  . losartan (COZAAR) 50 MG tablet Take 50 mg by mouth daily.  2  . meloxicam (MOBIC) 15 MG tablet Take 15 mg by mouth daily.  1  . metFORMIN (GLUCOPHAGE) 500 MG tablet Take 500 mg by mouth 2 (two) times daily with a meal.     . nitroGLYCERIN (NITROSTAT) 0.4 MG SL tablet Place 1 tablet (0.4 mg total) under the tongue every 5 (five) minutes as needed for chest pain. 25 tablet 3  . omeprazole (PRILOSEC) 20 MG capsule Take 1 capsule (20 mg total) by mouth 2 (two) times daily before a meal. 60 capsule 6  . phenazopyridine (PYRIDIUM) 200 MG tablet Take 1 tablet (200 mg  total) by mouth 3 (three) times daily with meals. 90 tablet 1  . potassium chloride SA (K-DUR,KLOR-CON) 20 MEQ tablet Take 1 tablet (20 mEq total) by mouth daily. 14 tablet 0  . rosuvastatin (CRESTOR) 20 MG tablet TAKE 20MG  1 TABLET DAILY FOR ELEVATED CHOLESTEROL  3  . SYMBICORT 160-4.5 MCG/ACT inhaler TAKE 2 PUFFS BY MOUTH TWICE A DAY  5  . tamsulosin (FLOMAX) 0.4 MG CAPS capsule Take 2 capsules (0.8 mg total) by mouth every evening. (Patient taking differently: Take 0.4 mg by mouth every evening. ) 30 capsule 2  . tiotropium (SPIRIVA HANDIHALER) 18 MCG inhalation capsule Place 1 capsule (18 mcg total) into inhaler and inhale daily. 30 capsule 1  . TOVIAZ 8 MG TB24 tablet Take 8 mg by mouth daily.     .  hyoscyamine (LEVSIN, ANASPAZ) 0.125 MG tablet Take 1 tablet (0.125 mg total) by mouth every 4 (four) hours as needed. 60 tablet 1  . prochlorperazine (COMPAZINE) 10 MG tablet TAKE 1 TABLET (10 MG TOTAL) BY MOUTH EVERY 6 (SIX) HOURS AS NEEDED FOR NAUSEA OR VOMITING. (Patient not taking: Reported on 07/02/2017) 30 tablet 0  . Vitamin D, Ergocalciferol, (DRISDOL) 50000 units CAPS capsule 1 PO ONCE A WEEK  5   No current facility-administered medications for this encounter.     Physical Findings:  height is 5\' 4"  (1.626 m) and weight is 147 lb 6.4 oz (66.9 kg). His oral temperature is 98.4 F (36.9 C). His blood pressure is 155/88 (abnormal) and his pulse is 95. His respiration is 18 and oxygen saturation is 96%.  Pain Assessment Pain Score: 8  Pain Loc: Penis/10 In general this is a well appearing Asian male in no acute distress. He's alert and oriented x4 and appropriate throughout the examination. Cardiopulmonary assessment is negative for acute distress and he exhibits normal effort.   Lab Findings: Lab Results  Component Value Date   WBC 6.8 06/24/2017   HGB 13.7 06/24/2017   HCT 41.0 06/24/2017   MCV 90.3 06/24/2017   PLT 289 06/24/2017     Radiographic Findings: Ct Abdomen Pelvis W Contrast  Result Date: 06/17/2017 CLINICAL DATA:  Bladder cancer, status post surgery 03/15/2017. Receiving radiation chemotherapy. Worsening pelvic pain, dysuria, abdominal distension EXAM: CT ABDOMEN AND PELVIS WITH CONTRAST TECHNIQUE: Multidetector CT imaging of the abdomen and pelvis was performed using the standard protocol following bolus administration of intravenous contrast. CONTRAST:  152mL ISOVUE-300 IOPAMIDOL (ISOVUE-300) INJECTION 61% COMPARISON:  03/04/2017 FINDINGS: Lower chest: Increased right basilar atelectasis. Left lung base clear. No pericardial or pleural effusion. Normal heart size. Hepatobiliary: No focal liver abnormality is seen. No gallstones, gallbladder wall thickening, or  biliary dilatation. Pancreas: Stable 10 mm hypodense pancreas body cystic lesion. No ductal dilatation. No surrounding inflammatory process. Spleen: Normal in size without focal abnormality. Adrenals/Urinary Tract: Normal adrenal glands. Stable scattered subcentimeter renal cortical cysts bilaterally. No hydronephrosis. Interval removal of the left ureteral stent. Nearly circumferential bladder wall thickening with mucosal enhancement compatible with known bladder cancer. No UVJ abnormality or obstruction. No pelvic fluid collection, hematoma or abscess. Stomach/Bowel: Negative for bowel obstruction, significant dilatation, ileus, or free air. Normal appendix. No fluid collection or abscess. Vascular/Lymphatic: Aortic atherosclerosis noted. Negative for aneurysm. No occlusive disease. Mesenteric and renal vasculature demonstrate atherosclerosis but remain patent. Iliac atherosclerosis noted without occlusive disease. Reproductive: Prostate gland is not enlarged. Seminal vesicles are atrophic. Other: No inguinal or abdominal wall hernia.  No ascites. Musculoskeletal: Bones are osteopenic.  No acute osseous  finding. IMPRESSION: Nearly circumferential diffuse bladder wall thickening with mucosal enhancement compatible with known bladder cancer. No UVJ obstruction, hydroureter or hydronephrosis. Interval removal of the left ureteral stent. No pelvic fluid collection, abscess, or hematoma. No other acute intra-abdominal or pelvic finding. Atherosclerosis without aneurysm Other ancillary findings as above. Electronically Signed   By: Jerilynn Mages.  Shick M.D.   On: 06/17/2017 20:52    Impression/Plan: 1. Stage II, cT2N0M0 Multifocal High grade urothelial carcinoma with involvement of the muscularis propria along the left trigone and right ureteral orifice. The patient will follow up with Dr. [name] for repeat imaging in July. We will see him as needed moving forward.  2. Dysuria. The patient has a complicated situation  currently with his urinary habits. I've asked the patient to call Dr. [name] for evaluation. I suspect he may have some urinary retention that could be putting him at risk of infections that could explain his UA findings, but not enough bacterial volume to see a positive culture. I will also reach out to Dr. [name] as well to make sure he's aware of the patient's symptoms. I've also called in Pyridium since it is stronger than the AZO he's previously taken, and Hyacimine for his spasm symptoms.           Carola Rhine, PAC

## 2017-07-03 ENCOUNTER — Telehealth: Payer: Self-pay | Admitting: Internal Medicine

## 2017-07-03 NOTE — Telephone Encounter (Signed)
Pt received call from [facility] campaign to schedule AWV. Pt stated that [name] is his PCP.  I asked him to contact Medicare to have the information updated. VDM (DD)

## 2017-07-08 HISTORY — PX: TRANSTHORACIC ECHOCARDIOGRAM: SHX275

## 2017-07-10 DIAGNOSIS — E1165 Type 2 diabetes mellitus with hyperglycemia: Secondary | ICD-10-CM | POA: Diagnosis not present

## 2017-07-10 DIAGNOSIS — C679 Malignant neoplasm of bladder, unspecified: Secondary | ICD-10-CM | POA: Diagnosis not present

## 2017-07-10 DIAGNOSIS — J449 Chronic obstructive pulmonary disease, unspecified: Secondary | ICD-10-CM | POA: Diagnosis not present

## 2017-07-10 DIAGNOSIS — I10 Essential (primary) hypertension: Secondary | ICD-10-CM | POA: Diagnosis not present

## 2017-07-19 ENCOUNTER — Inpatient Hospital Stay: Payer: Medicare HMO | Attending: Oncology

## 2017-07-19 ENCOUNTER — Ambulatory Visit (HOSPITAL_COMMUNITY)
Admission: RE | Admit: 2017-07-19 | Discharge: 2017-07-19 | Disposition: A | Discharge status: Home / Self Care | Payer: Medicare HMO | Source: Ambulatory Visit | Attending: Oncology | Admitting: Oncology

## 2017-07-19 DIAGNOSIS — K869 Disease of pancreas, unspecified: Secondary | ICD-10-CM | POA: Diagnosis not present

## 2017-07-19 DIAGNOSIS — R300 Dysuria: Secondary | ICD-10-CM | POA: Diagnosis not present

## 2017-07-19 DIAGNOSIS — C672 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: Secondary | ICD-10-CM | POA: Diagnosis not present

## 2017-07-19 DIAGNOSIS — R911 Solitary pulmonary nodule: Secondary | ICD-10-CM | POA: Insufficient documentation

## 2017-07-19 DIAGNOSIS — Z8551 Personal history of malignant neoplasm of bladder: Secondary | ICD-10-CM | POA: Diagnosis not present

## 2017-07-19 DIAGNOSIS — C679 Malignant neoplasm of bladder, unspecified: Secondary | ICD-10-CM | POA: Diagnosis not present

## 2017-07-19 DIAGNOSIS — I77819 Aortic ectasia, unspecified site: Secondary | ICD-10-CM | POA: Diagnosis not present

## 2017-07-19 DIAGNOSIS — J439 Emphysema, unspecified: Secondary | ICD-10-CM | POA: Insufficient documentation

## 2017-07-19 LAB — CMP (CANCER CENTER ONLY)
ALK PHOS: 66 U/L (ref 38–126)
ALT: 26 U/L (ref 0–44)
ANION GAP: 10 (ref 5–15)
AST: 20 U/L (ref 15–41)
Albumin: 4 g/dL (ref 3.5–5.0)
BUN: 9 mg/dL (ref 8–23)
CALCIUM: 9.7 mg/dL (ref 8.9–10.3)
CO2: 27 mmol/L (ref 22–32)
Chloride: 105 mmol/L (ref 98–111)
Creatinine: 1.08 mg/dL (ref 0.61–1.24)
Glucose, Bld: 133 mg/dL — ABNORMAL HIGH (ref 70–99)
Potassium: 3.9 mmol/L (ref 3.5–5.1)
Sodium: 142 mmol/L (ref 135–145)
TOTAL PROTEIN: 7 g/dL (ref 6.5–8.1)
Total Bilirubin: 0.4 mg/dL (ref 0.3–1.2)

## 2017-07-19 LAB — CBC WITH DIFFERENTIAL (CANCER CENTER ONLY)
Basophils Absolute: 0 10*3/uL (ref 0.0–0.1)
Basophils Relative: 0 %
EOS PCT: 3 %
Eosinophils Absolute: 0.2 10*3/uL (ref 0.0–0.5)
HCT: 38 % — ABNORMAL LOW (ref 38.4–49.9)
HEMOGLOBIN: 12.5 g/dL — AB (ref 13.0–17.1)
LYMPHS ABS: 0.9 10*3/uL (ref 0.9–3.3)
LYMPHS PCT: 16 %
MCH: 30.5 pg (ref 27.2–33.4)
MCHC: 32.9 g/dL (ref 32.0–36.0)
MCV: 92.7 fL (ref 79.3–98.0)
MONOS PCT: 13 %
Monocytes Absolute: 0.7 10*3/uL (ref 0.1–0.9)
Neutro Abs: 3.6 10*3/uL (ref 1.5–6.5)
Neutrophils Relative %: 68 %
Platelet Count: 292 10*3/uL (ref 140–400)
RBC: 4.1 MIL/uL — AB (ref 4.20–5.82)
RDW: 14.7 % — ABNORMAL HIGH (ref 11.0–14.6)
WBC: 5.4 10*3/uL (ref 4.0–10.3)

## 2017-07-19 MED ORDER — SODIUM CHLORIDE 0.9 % IV SOLN
INTRAVENOUS | Status: AC
  Filled 2017-07-19: qty 250

## 2017-07-19 MED ORDER — IOPAMIDOL (ISOVUE-300) INJECTION 61%
INTRAVENOUS | Status: AC
  Filled 2017-07-19: qty 100

## 2017-07-19 MED ORDER — IOPAMIDOL (ISOVUE-300) INJECTION 61%
100.0000 mL | Freq: Once | INTRAVENOUS | Status: AC | PRN
  Administered 2017-07-19: 100 mL via INTRAVENOUS

## 2017-07-23 ENCOUNTER — Inpatient Hospital Stay (HOSPITAL_BASED_OUTPATIENT_CLINIC_OR_DEPARTMENT_OTHER): Payer: Medicare HMO | Admitting: Oncology

## 2017-07-23 ENCOUNTER — Telehealth: Payer: Self-pay | Admitting: Oncology

## 2017-07-23 VITALS — BP 137/64 | HR 84 | Temp 98.1°F | Resp 18 | Ht 64.0 in | Wt 150.1 lb

## 2017-07-23 DIAGNOSIS — C672 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: Secondary | ICD-10-CM

## 2017-07-23 DIAGNOSIS — R300 Dysuria: Secondary | ICD-10-CM | POA: Diagnosis not present

## 2017-07-23 DIAGNOSIS — Z8551 Personal history of malignant neoplasm of bladder: Secondary | ICD-10-CM

## 2017-07-23 NOTE — Progress Notes (Signed)
Hematology and Oncology Follow Up Visit  [name] [id number] 30-Aug-1945 72 y.o. 07/23/2017 2:59 PM [name], MDAvbuere, Edwin, MD   Principle Diagnosis: 72 year old man with T2N0 urothelial carcinoma, high-grade of the bladder diagnosed in March 2018.  .   Prior Therapy: He is status post TURBT done on March 15, 2017. He is status post definitive radiation therapy with weekly carboplatin completed on May 23, 2017.  Current therapy: Active surveillance.  Interim History: [name] is here for a follow-up visit.  Since the last visit, he completed definitive therapy with radiation and weekly carboplatin without major complications.  He does report symptoms of dysuria which has resolved at this time.  He also had some abdominal cramping which is also improving slowly.  His appetite is picked up and his weight is relatively stable.  He was seen in the emergency department on 2 separate occasions for dysuria and was prescribed Phenazopyridine which has helped his symptoms.  He does not report any headaches, blurry vision, or seizures.  He denies any alteration in mental status or confusion.  Does not report any fevers, chills or sweats.  Does not report any cough, wheezing or hemoptysis.  Does not report any chest pain, palpitation, orthopnea or leg edema.  Does not report any nausea, vomiting or abdominal pain.    He does report occasional bloating and cramping.  He denies any hematochezia or melena. Does not report any bone pain or fractures.   Does not report frequency, urgency or hematuria.  Does not report any skin rashes or lesions.  Does not report any lymphadenopathy or petechiae.  Does not report any injuries in his mood.  Remaining review of systems is negative.    Medications: I have reviewed the patient's current medications.  Current Outpatient Medications  Medication Sig Dispense Refill  . acetaminophen (TYLENOL) 500 MG tablet Take 500 mg by mouth every 8 (eight) hours as needed  for mild pain, moderate pain, fever or headache.    . albuterol (PROVENTIL HFA) 108 (90 BASE) MCG/ACT inhaler Inhale 2 puffs into the lungs 2 (two) times daily. 1 Inhaler 1  . albuterol (PROVENTIL) (2.5 MG/3ML) 0.083% nebulizer solution Take 2.5 mg by nebulization every 6 (six) hours as needed for wheezing or shortness of breath.    [name] amLODipine (NORVASC) 10 MG tablet Take 10 mg by mouth daily.     [name] aspirin EC 81 MG tablet Take 81 mg by mouth daily.    . cephALEXin (KEFLEX) 500 MG capsule Take 1 capsule (500 mg total) by mouth 3 (three) times daily. 21 capsule 0  . folic acid (FOLVITE) 1 MG tablet Take 1 mg by mouth daily.  2  . hyoscyamine (LEVSIN, ANASPAZ) 0.125 MG tablet Take 1 tablet (0.125 mg total) by mouth every 4 (four) hours as needed. 60 tablet 1  . ibuprofen (ADVIL,MOTRIN) 200 MG tablet Take 200 mg by mouth every 8 (eight) hours as needed for fever, headache, mild pain, moderate pain or cramping.    [name] LINZESS 72 MCG capsule Take 72 mcg by mouth every morning.  2  . losartan (COZAAR) 50 MG tablet Take 50 mg by mouth daily.  2  . meloxicam (MOBIC) 15 MG tablet Take 15 mg by mouth daily.  1  . metFORMIN (GLUCOPHAGE) 500 MG tablet Take 500 mg by mouth 2 (two) times daily with a meal.     . nitroGLYCERIN (NITROSTAT) 0.4 MG SL tablet Place 1 tablet (0.4 mg total) under the tongue every 5 (  five) minutes as needed for chest pain. 25 tablet 3  . omeprazole (PRILOSEC) 20 MG capsule Take 1 capsule (20 mg total) by mouth 2 (two) times daily before a meal. 60 capsule 6  . phenazopyridine (PYRIDIUM) 200 MG tablet Take 1 tablet (200 mg total) by mouth 3 (three) times daily with meals. 90 tablet 1  . potassium chloride SA (K-DUR,KLOR-CON) 20 MEQ tablet Take 1 tablet (20 mEq total) by mouth daily. 14 tablet 0  . prochlorperazine (COMPAZINE) 10 MG tablet TAKE 1 TABLET (10 MG TOTAL) BY MOUTH EVERY 6 (SIX) HOURS AS NEEDED FOR NAUSEA OR VOMITING. (Patient not taking: Reported on 07/02/2017) 30 tablet 0  .  rosuvastatin (CRESTOR) 20 MG tablet TAKE 20MG  1 TABLET DAILY FOR ELEVATED CHOLESTEROL  3  . SYMBICORT 160-4.5 MCG/ACT inhaler TAKE 2 PUFFS BY MOUTH TWICE A DAY  5  . tamsulosin (FLOMAX) 0.4 MG CAPS capsule Take 2 capsules (0.8 mg total) by mouth every evening. (Patient taking differently: Take 0.4 mg by mouth every evening. ) 30 capsule 2  . tiotropium (SPIRIVA HANDIHALER) 18 MCG inhalation capsule Place 1 capsule (18 mcg total) into inhaler and inhale daily. 30 capsule 1  . TOVIAZ 8 MG TB24 tablet Take 8 mg by mouth daily.     . Vitamin D, Ergocalciferol, (DRISDOL) 50000 units CAPS capsule 1 PO ONCE A WEEK  5   No current facility-administered medications for this visit.      Allergies: No Known Allergies  Past Medical History, Surgical history, Social history, and Family History were reviewed and updated.    Physical Exam:  Blood pressure 137/64, pulse 84, temperature 98.1 F (36.7 C), temperature source Oral, resp. rate 18, height 5\' 4"  (1.626 m), weight 150 lb 1.6 oz (68.1 kg), SpO2 95 %.    ECOG: 1 General appearance: Alert, awake gentleman without distress. Head: Normocephalic without abnormalities. Oropharynx: No oral thrush or ulcers. Eyes: Pupils are equal and round reactive to light. Lymph nodes: No  cervical, supraclavicular, inguinal or axillary lymphadenopathy. Heart: Regular rate or gallops. Lung: Clear without any rhonchi, wheezes or dullness to percussion.. Abdomin: Soft, without any rebound or guarding.  No shifting dullness or ascites. Neurological: No motor or sensory deficits.  Ambulating without any difficulties. Skin: No ecchymosis or petechiae. Musculoskeletal: No clubbing or cyanosis.     Lab Results: Lab Results  Component Value Date   WBC 5.4 07/19/2017   HGB 12.5 (L) 07/19/2017   HCT 38.0 (L) 07/19/2017   MCV 92.7 07/19/2017   PLT 292 07/19/2017     Chemistry      Component Value Date/Time   NA 142 07/19/2017 0846   K 3.9 07/19/2017 0846    CL 105 07/19/2017 0846   CO2 27 07/19/2017 0846   BUN 9 07/19/2017 0846   CREATININE 1.08 07/19/2017 0846   CREATININE 0.75 04/08/2015 1049      Component Value Date/Time   CALCIUM 9.7 07/19/2017 0846   ALKPHOS 66 07/19/2017 0846   AST 20 07/19/2017 0846   ALT 26 07/19/2017 0846   BILITOT 0.4 07/19/2017 0846       Impression and Plan:  72 year old man with:  1.    T2N0 high-grade urothelial carcinoma of the bladder with multifocal presentation diagnosed in March 2019.   He completed definitive therapy with radiation and weekly carboplatin without any delayed complications.  CT scan obtained on 07/19/2017 was personally reviewed and showed no evidence of measurable disease detected outside of the bladder.  The natural  course of this disease was reviewed today with the patient and his daughter via an interpreter.  At this time, I have recommended continue active surveillance with repeat CT scan every 6 months.  Frequent cystoscopies may be needed for surveillance to detect any recurrence within the bladder.  I will asked Dr. Tresa Moore to evaluate him for possible cystoscopy in the near future.  2.  Renal function surveillance: His creatinine look clearance continues to be close to baseline without any evidence of deterioration after therapy.  We will continue to monitor this periodically.   3.  Dysuria: Resolving at this time after completing radiation therapy.  4.  Follow-up:  In 3 months to follow his progress and he will have a repeat CT scan in 6 months.  15  minutes was spent with the patient face-to-face today.  More than 50% of time was dedicated to patient counseling, education and discussing the natural course of this disease as well as future treatment plans.   [name], MD 7/16/20192:59 PM

## 2017-07-23 NOTE — Telephone Encounter (Signed)
Scheduled appt per 7/16 los - gave patient aVS and calender per los.  

## 2017-07-26 ENCOUNTER — Encounter (HOSPITAL_COMMUNITY): Payer: Self-pay | Admitting: Nurse Practitioner

## 2017-07-26 ENCOUNTER — Emergency Department (HOSPITAL_COMMUNITY): Payer: Medicare HMO

## 2017-07-26 ENCOUNTER — Other Ambulatory Visit: Payer: Self-pay

## 2017-07-26 ENCOUNTER — Observation Stay (HOSPITAL_COMMUNITY)
Admission: EM | Admit: 2017-07-26 | Discharge: 2017-07-28 | Disposition: A | Discharge status: Home / Self Care | Payer: Medicare HMO | Attending: Family Medicine | Admitting: Family Medicine

## 2017-07-26 DIAGNOSIS — D649 Anemia, unspecified: Secondary | ICD-10-CM | POA: Insufficient documentation

## 2017-07-26 DIAGNOSIS — R110 Nausea: Secondary | ICD-10-CM | POA: Diagnosis not present

## 2017-07-26 DIAGNOSIS — Z79899 Other long term (current) drug therapy: Secondary | ICD-10-CM | POA: Diagnosis not present

## 2017-07-26 DIAGNOSIS — R112 Nausea with vomiting, unspecified: Secondary | ICD-10-CM | POA: Diagnosis not present

## 2017-07-26 DIAGNOSIS — Z7982 Long term (current) use of aspirin: Secondary | ICD-10-CM | POA: Diagnosis not present

## 2017-07-26 DIAGNOSIS — T380X5A Adverse effect of glucocorticoids and synthetic analogues, initial encounter: Secondary | ICD-10-CM | POA: Diagnosis not present

## 2017-07-26 DIAGNOSIS — K589 Irritable bowel syndrome without diarrhea: Secondary | ICD-10-CM | POA: Diagnosis not present

## 2017-07-26 DIAGNOSIS — Z923 Personal history of irradiation: Secondary | ICD-10-CM | POA: Diagnosis not present

## 2017-07-26 DIAGNOSIS — I5032 Chronic diastolic (congestive) heart failure: Secondary | ICD-10-CM | POA: Insufficient documentation

## 2017-07-26 DIAGNOSIS — E1165 Type 2 diabetes mellitus with hyperglycemia: Secondary | ICD-10-CM | POA: Diagnosis not present

## 2017-07-26 DIAGNOSIS — Z955 Presence of coronary angioplasty implant and graft: Secondary | ICD-10-CM | POA: Insufficient documentation

## 2017-07-26 DIAGNOSIS — Z87891 Personal history of nicotine dependence: Secondary | ICD-10-CM | POA: Diagnosis not present

## 2017-07-26 DIAGNOSIS — E1159 Type 2 diabetes mellitus with other circulatory complications: Secondary | ICD-10-CM | POA: Diagnosis present

## 2017-07-26 DIAGNOSIS — C672 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: Secondary | ICD-10-CM | POA: Diagnosis not present

## 2017-07-26 DIAGNOSIS — R0902 Hypoxemia: Secondary | ICD-10-CM | POA: Insufficient documentation

## 2017-07-26 DIAGNOSIS — R5381 Other malaise: Secondary | ICD-10-CM | POA: Diagnosis not present

## 2017-07-26 DIAGNOSIS — K219 Gastro-esophageal reflux disease without esophagitis: Secondary | ICD-10-CM | POA: Diagnosis not present

## 2017-07-26 DIAGNOSIS — J441 Chronic obstructive pulmonary disease with (acute) exacerbation: Secondary | ICD-10-CM | POA: Diagnosis not present

## 2017-07-26 DIAGNOSIS — I2510 Atherosclerotic heart disease of native coronary artery without angina pectoris: Secondary | ICD-10-CM | POA: Diagnosis not present

## 2017-07-26 DIAGNOSIS — E876 Hypokalemia: Secondary | ICD-10-CM | POA: Diagnosis not present

## 2017-07-26 DIAGNOSIS — E785 Hyperlipidemia, unspecified: Secondary | ICD-10-CM | POA: Insufficient documentation

## 2017-07-26 DIAGNOSIS — Z7984 Long term (current) use of oral hypoglycemic drugs: Secondary | ICD-10-CM | POA: Diagnosis not present

## 2017-07-26 DIAGNOSIS — I110 Hypertensive heart disease with heart failure: Secondary | ICD-10-CM | POA: Insufficient documentation

## 2017-07-26 DIAGNOSIS — I10 Essential (primary) hypertension: Secondary | ICD-10-CM | POA: Diagnosis not present

## 2017-07-26 DIAGNOSIS — I252 Old myocardial infarction: Secondary | ICD-10-CM | POA: Insufficient documentation

## 2017-07-26 DIAGNOSIS — Z9221 Personal history of antineoplastic chemotherapy: Secondary | ICD-10-CM | POA: Insufficient documentation

## 2017-07-26 DIAGNOSIS — R5383 Other fatigue: Secondary | ICD-10-CM | POA: Diagnosis not present

## 2017-07-26 DIAGNOSIS — R0602 Shortness of breath: Secondary | ICD-10-CM | POA: Diagnosis not present

## 2017-07-26 DIAGNOSIS — J189 Pneumonia, unspecified organism: Secondary | ICD-10-CM

## 2017-07-26 LAB — URINALYSIS, ROUTINE W REFLEX MICROSCOPIC
Bilirubin Urine: NEGATIVE
GLUCOSE, UA: NEGATIVE mg/dL
HGB URINE DIPSTICK: NEGATIVE
Ketones, ur: 5 mg/dL — AB
Leukocytes, UA: NEGATIVE
NITRITE: POSITIVE — AB
PH: 9 — AB (ref 5.0–8.0)
Protein, ur: NEGATIVE mg/dL
Specific Gravity, Urine: 1.011 (ref 1.005–1.030)

## 2017-07-26 LAB — COMPREHENSIVE METABOLIC PANEL
ALK PHOS: 47 U/L (ref 38–126)
ALT: 24 U/L (ref 0–44)
AST: 26 U/L (ref 15–41)
Albumin: 3.6 g/dL (ref 3.5–5.0)
Anion gap: 8 (ref 5–15)
BUN: 10 mg/dL (ref 8–23)
CALCIUM: 8.4 mg/dL — AB (ref 8.9–10.3)
CHLORIDE: 107 mmol/L (ref 98–111)
CO2: 25 mmol/L (ref 22–32)
CREATININE: 0.66 mg/dL (ref 0.61–1.24)
Glucose, Bld: 142 mg/dL — ABNORMAL HIGH (ref 70–99)
Potassium: 3.1 mmol/L — ABNORMAL LOW (ref 3.5–5.1)
Sodium: 140 mmol/L (ref 135–145)
Total Bilirubin: 0.6 mg/dL (ref 0.3–1.2)
Total Protein: 6.6 g/dL (ref 6.5–8.1)

## 2017-07-26 LAB — CBC WITH DIFFERENTIAL/PLATELET
BASOS PCT: 0 %
Basophils Absolute: 0 10*3/uL (ref 0.0–0.1)
EOS ABS: 0.1 10*3/uL (ref 0.0–0.7)
EOS PCT: 1 %
HCT: 37.3 % — ABNORMAL LOW (ref 39.0–52.0)
Hemoglobin: 12.2 g/dL — ABNORMAL LOW (ref 13.0–17.0)
LYMPHS ABS: 1 10*3/uL (ref 0.7–4.0)
Lymphocytes Relative: 11 %
MCH: 30.3 pg (ref 26.0–34.0)
MCHC: 32.7 g/dL (ref 30.0–36.0)
MCV: 92.8 fL (ref 78.0–100.0)
MONOS PCT: 9 %
Monocytes Absolute: 0.8 10*3/uL (ref 0.1–1.0)
Neutro Abs: 7.1 10*3/uL (ref 1.7–7.7)
Neutrophils Relative %: 79 %
PLATELETS: 304 10*3/uL (ref 150–400)
RBC: 4.02 MIL/uL — ABNORMAL LOW (ref 4.22–5.81)
RDW: 14.4 % (ref 11.5–15.5)
WBC: 9.1 10*3/uL (ref 4.0–10.5)

## 2017-07-26 LAB — I-STAT CG4 LACTIC ACID, ED
LACTIC ACID, VENOUS: 0.75 mmol/L (ref 0.5–1.9)
Lactic Acid, Venous: 1.48 mmol/L (ref 0.5–1.9)

## 2017-07-26 LAB — I-STAT TROPONIN, ED: TROPONIN I, POC: 0 ng/mL (ref 0.00–0.08)

## 2017-07-26 LAB — D-DIMER, QUANTITATIVE (NOT AT ARMC): D DIMER QUANT: 0.66 ug{FEU}/mL — AB (ref 0.00–0.50)

## 2017-07-26 MED ORDER — SODIUM CHLORIDE 0.9 % IV BOLUS
500.0000 mL | Freq: Once | INTRAVENOUS | Status: AC
  Administered 2017-07-26: 500 mL via INTRAVENOUS

## 2017-07-26 MED ORDER — CEFTRIAXONE SODIUM 1 G IJ SOLR
1.0000 g | Freq: Once | INTRAMUSCULAR | Status: AC
  Administered 2017-07-26: 1 g via INTRAVENOUS
  Filled 2017-07-26: qty 10

## 2017-07-26 MED ORDER — METHYLPREDNISOLONE SODIUM SUCC 125 MG IJ SOLR
125.0000 mg | Freq: Once | INTRAMUSCULAR | Status: AC
  Administered 2017-07-26: 125 mg via INTRAVENOUS
  Filled 2017-07-26: qty 2

## 2017-07-26 MED ORDER — IPRATROPIUM-ALBUTEROL 0.5-2.5 (3) MG/3ML IN SOLN
3.0000 mL | Freq: Once | RESPIRATORY_TRACT | Status: AC
  Administered 2017-07-26: 3 mL via RESPIRATORY_TRACT
  Filled 2017-07-26: qty 3

## 2017-07-26 NOTE — ED Triage Notes (Signed)
Pt is presented from home, c/o abdominal pain, dysuria, N/V for the last 3 days, with associated shortness of breath, pt has hx bladder Ca, received 100 mcg of IV fentanyl and 4 mg Zofran.  Anticipate use [place].

## 2017-07-26 NOTE — ED Notes (Signed)
Respiratory contacted for Duoneb.

## 2017-07-26 NOTE — ED Provider Notes (Signed)
[facility] DEPT Provider Note   CSN: [id number] Arrival date & time: 07/26/17  1615     History   Chief Complaint Chief Complaint  Patient presents with  . Abdominal Pain  . Shortness of Breath    HPI [name] is a 72 y.o. male w/ h/o bladder cancer diagnosed March 2018 status post TURBT and definitive radiation therapy completed May 2019, advanced COPD, CAD with stents, hypertension, hyperlipidemia here for evaluation of dizziness, feeling tired, nausea, vomiting x6, abdominal pain, sudden onset 6 AM this morning 30 minutes after he took his pain medicine without eating.  Thinks that symptoms are from this medicine and not having food with it.  He takes pain medicine for chronic dysuria and medicine has never caused him any issues with food.  He feels a little bit better but still endorses feeling very tired, nauseous, abdominal pain, and having difficulty breathing.  Chronic productive cough.  Endorses constipation recently with last BM unknown.  Currently denies headache, pain in his chest, diarrhea, blood in his urine.  Of note, translator had difficult time understanding patient, stating patient was putting words together that did not make sense such as "bladder", "pain", "no", "everything".    HPI  Past Medical History:  Diagnosis Date  . Allergic rhinitis   . CAD (coronary artery disease), native coronary artery cardiologist-  [name]   a. thought due to vasospasm . cath - CAD LAD 40-50%, LCX 40%, RCA 25% EF 65%. lexiscan myoview EF 68% w/o evidence of ischemia or infarct b. STEMI s/p BMS-mid RCA c. BMS x 2- prox RCA, PLOM  . 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 COPD, group C, by GOLD 2017 classification [facility]) pulmonologist-- dr mcquid   former smoker--- severe OSA w/ hx multiple hospital admissions w/ exacerbation's  . Dyspnea   . Dysuria   . Full dentures   . GERD (gastroesophageal reflux disease)   . History of non-ST elevation myocardial  infarction (NSTEMI) 06/19/2009   in setting COPD w/ exacerbation;  negative myoview for ischemia;  previously had cardiac cath 06-08-2009 w/ diffuse nonobtructive CAD and  normal LVF  . History of prostatitis   . History of ST elevation myocardial infarction (STEMI) 02/15/2012   in setting of COPD w/ exacerbation; per cardiac cath PCI and stenting to RCA  . HTN (hypertension)   . Hyperlipidemia   . Lower abdominal pain   . 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 Type 2 diabetes mellitus ([place])   . Wears glasses     Patient Active Problem List   Diagnosis Date Noted  . 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 ([place]) 03/15/2017  . Sepsis ([place]) 08/03/2016  . Hematuria 08/03/2016  . Diabetes mellitus, type 2 ([place]) 06/01/2015  . Acute on chronic respiratory failure with hypercapnia ([place])   . Diabetes mellitus with complication ([place])   . Dysuria 03/01/2015  . Type 2 diabetes mellitus with circulatory disorder ([place])   . Hypokalemia   . PVC's (premature ventricular contractions)   . Shortness of breath 05/01/2014  . SIRS (systemic inflammatory response syndrome) ([place]) 05/01/2014  . COPD exacerbation ([place]) 05/01/2014  . HTN (hypertension) 05/01/2014  . CAD (coronary artery disease), native coronary artery 05/01/2014  . Acute respiratory failure with hypoxia ([place]) 05/01/2014  . Uncontrolled diabetes mellitus without complication ([place]) 37/10/6267  . SOB (shortness  of breath)   . Blood poisoning   . Chest pain, negative MI, secondary due to COPD exacerbation  08/04/2013  . Abnormal stress test 07/02/2012  . STEMI (ST elevation myocardial infarction) ([place]) 02/17/2012  . Hyperlipidemia 02/17/2012  . Essential hypertension 02/17/2012  . Tobacco abuse, has stopped 02/17/2012  . Allergic rhinitis 02/17/2012  . Coronary atherosclerosis 06/14/2009  . COPD with  exacerbation ([place]) 05/31/2009  . OBSTRUCTIVE CHRONIC BRONCHITIS 11/13/2006  . GERD 11/13/2006    Past Surgical History:  Procedure Laterality Date  . CARDIAC CATHETERIZATION N/A 12/27/2014   Procedure: Left Heart Cath and Coronary Angiography;  Surgeon: Jettie Booze, MD;  Location: [facility];  Service: Cardiovascular;  Laterality: N/A; ost.LAD to pLAD 40%, ost.D2 to D2 80%, mCFx 50%, mild to moderate left sided disease, patent RCA stents, normal LVF  . CARDIAC CATHETERIZATION  06-08-2009   dr cooper   diffuse non-obstructive CAD , normal LVF  . CARDIOVASCULAR STRESS TEST  06-25-2012  [name]   intermediate risk nuclear stress study w/ medium-sized, mild primary reversible basal to mid inferior perfusion defect suggestive of ischemia/  normal LV function and wall motion ,ef 63%/  pt scheduled for cardiac cath  . CYSTOSCOPY W/ RETROGRADES Bilateral 09/07/2016   Procedure: CYSTOSCOPY WITH BILATERAL RETROGRADE PYELOGRAM;  Surgeon: [name], MD;  Location: [facility];  Service: Urology;  Laterality: Bilateral;  . CYSTOSCOPY W/ RETROGRADES Bilateral 03/15/2017   Procedure: CYSTOSCOPY WITH RETROGRADE PYELOGRAM;  Surgeon: [name], MD;  Location[facility];  Service: Urology;  Laterality: Bilateral;  . CYSTOSCOPY WITH RETROGRADE PYELOGRAM, URETEROSCOPY AND STENT PLACEMENT Left 10/19/2016   Procedure: CYSTOSCOPY WITH RETROGRADE PYELOGRAM, URETEROSCOPY AND STENT PLACEMENT;  Surgeon: [name], MD;  Location: [facility];  Service: Urology;  Laterality: Left;  . left foot surgery     repair - and ankle   . LEFT HEART CATHETERIZATION WITH CORONARY ANGIOGRAM N/A 02/15/2012   Procedure: LEFT HEART CATHETERIZATION WITH CORONARY ANGIOGRAM;  Surgeon: Peter M [name], MD;  Location: [facility] CATH LAB;  Service: Cardiovascular;  Laterality: N/A;  single vessel obstructive disease of the mid RCA, normal LVF  . LEFT HEART CATHETERIZATION WITH CORONARY ANGIOGRAM N/A  07/01/2012   Procedure: LEFT HEART CATHETERIZATION WITH CORONARY ANGIOGRAM;  Surgeon: Peter M [name], MD;  Location: [facility] CATH LAB;  Service: Cardiovascular;  Laterality: N/A;  class III angina and positive stress test; patent mRCA stent;  severe obstructive single vessel disease to new lesion proximal RCA & PLOM branch; normal LVF  . LEFT HEART CATHETERIZATION WITH CORONARY ANGIOGRAM N/A 08/03/2013   Procedure: LEFT HEART CATHETERIZATION WITH CORONARY ANGIOGRAM;  Surgeon: Peter M [name], MD;  Location: [facility] CATH LAB;  Service: Cardiovascular;  Laterality: N/A;  nonobstuctive CAD, normal LVF, stents patent mRCA and pRCA, mild diffuse disease up to 30% of stent in the PLOM  . PERCUTANEOUS CORONARY STENT INTERVENTION (PCI-S) N/A 02/15/2012   Procedure: PERCUTANEOUS CORONARY STENT INTERVENTION (PCI-S);  Surgeon: Peter M [name], MD;  Location: [facility] CATH LAB;  Service: Cardiovascular;  Laterality: N/A;   BMS x1 to midRCA  . PERCUTANEOUS CORONARY STENT INTERVENTION (PCI-S)  07/01/2012   Procedure: PERCUTANEOUS CORONARY STENT INTERVENTION (PCI-S);  Surgeon: Peter M [name], MD;  Location: [facility] CATH LAB;  Service: Cardiovascular;;  BMS to pRCA; BMS to PLOM  . TRANSTHORACIC ECHOCARDIOGRAM  08/05/2016   moderate LVH, ef 40-98%, grade 1 diastolic dysfunction/ mild PR and TR, trivial pericardial effusion/   . TRANSURETHRAL RESECTION OF BLADDER  TUMOR N/A 09/07/2016   Procedure: TRANSURETHRAL RESECTION OF BLADDER TUMOR (TURBT);  Surgeon: [name], MD;  Location: [facility];  Service: Urology;  Laterality: N/A;  . TRANSURETHRAL RESECTION OF BLADDER TUMOR N/A 10/19/2016   Procedure: TRANSURETHRAL RESECTION OF BLADDER TUMOR (TURBT);  Surgeon: [name], MD;  Location: [facility];  Service: Urology;  Laterality: N/A;  . TRANSURETHRAL RESECTION OF BLADDER TUMOR N/A 03/15/2017   Procedure: TRANSURETHRAL RESECTION OF BLADDER TUMOR (TURBT);  Surgeon: [name], MD;  [facility];  Service:  Urology;  Laterality: N/A;        Home Medications    Prior to Admission medications   Medication Sig Start Date End Date Taking? Authorizing Provider  albuterol (PROVENTIL HFA) 108 (90 BASE) MCG/ACT inhaler Inhale 2 puffs into the lungs 2 (two) times daily. 05/06/14  Yes Robbie Lis, MD  albuterol (PROVENTIL) (2.5 MG/3ML) 0.083% nebulizer solution Take 2.5 mg by nebulization every 6 (six) hours as needed for wheezing or shortness of breath.   Yes [provider]  amLODipine (NORVASC) 10 MG tablet Take 10 mg by mouth daily.  04/07/17  Yes [provider]  arformoterol (BROVANA) 15 MCG/2ML NEBU Take 15 mcg by nebulization 2 (two) times daily.   Yes [provider]  aspirin EC 81 MG tablet Take 81 mg by mouth daily.   Yes [provider]  diclofenac sodium (VOLTAREN) 1 % GEL Apply 4 g topically 4 (four) times daily as needed (knee pain).   Yes [provider]  folic acid (FOLVITE) 1 MG tablet Take 1 mg by mouth daily. 05/03/17  Yes [provider]  losartan (COZAAR) 50 MG tablet Take 50 mg by mouth daily. 06/08/17  Yes [provider]  metFORMIN (GLUCOPHAGE) 500 MG tablet Take 500 mg by mouth 2 (two) times daily with a meal.    Yes [provider]  Multiple Vitamins-Minerals (MULTIVITAMIN ADULTS) TABS Take 1 tablet by mouth daily.   Yes [provider]  nitroGLYCERIN (NITROSTAT) 0.4 MG SL tablet Place 1 tablet (0.4 mg total) under the tongue every 5 (five) minutes as needed for chest pain. 01/05/15  Yes [name], PA  rosuvastatin (CRESTOR) 20 MG tablet TAKE 20MG  1 TABLET DAILY FOR ELEVATED CHOLESTEROL 06/01/17  Yes [provider]  SYMBICORT 160-4.5 MCG/ACT inhaler TAKE 2 PUFFS BY MOUTH TWICE A DAY 05/29/17  Yes [provider]  tamsulosin (FLOMAX) 0.4 MG CAPS capsule Take 2 capsules (0.8 mg total) by mouth every evening. Patient taking differently: Take 0.4 mg by mouth every evening.  08/08/16  Yes  Waldron Session, MD  tiotropium (SPIRIVA HANDIHALER) 18 MCG inhalation capsule Place 1 capsule (18 mcg total) into inhaler and inhale daily. 05/06/14  Yes Robbie Lis, MD  TOVIAZ 8 MG TB24 tablet Take 8 mg by mouth daily.  06/16/17  Yes [provider]  traMADol (ULTRAM) 50 MG tablet Take 50 mg by mouth every 8 (eight) hours as needed for moderate pain.   Yes [provider]  Vitamin D, Ergocalciferol, (DRISDOL) 50000 units CAPS capsule 1 PO ONCE A WEEK 03/28/17  Yes [provider]  acetaminophen (TYLENOL) 500 MG tablet Take 500 mg by mouth every 8 (eight) hours as needed for mild pain, moderate pain, fever or headache.    [provider]  cephALEXin (KEFLEX) 500 MG capsule Take 1 capsule (500 mg total) by mouth 3 (three) times daily. Patient not taking: Reported on 07/26/2017 06/24/17  Jola Schmidt, MD  hyoscyamine (LEVSIN, ANASPAZ) 0.125 MG tablet Take 1 tablet (0.125 mg total) by mouth every 4 (four) hours as needed. 07/02/17   Hayden Pedro, PA-C  ibuprofen (ADVIL,MOTRIN) 200 MG tablet Take 200 mg by mouth every 8 (eight) hours as needed for fever, headache, mild pain, moderate pain or cramping.    [provider]  LINZESS 72 MCG capsule Take 72 mcg by mouth every morning. 05/29/17   [provider]  meloxicam (MOBIC) 15 MG tablet Take 15 mg by mouth daily. 05/07/17   [provider]  omeprazole (PRILOSEC) 20 MG capsule Take 1 capsule (20 mg total) by mouth 2 (two) times daily before a meal. 10/08/16   [name], Peter M, MD  phenazopyridine (PYRIDIUM) 200 MG tablet Take 1 tablet (200 mg total) by mouth 3 (three) times daily with meals. 07/02/17   Hayden Pedro, PA-C  potassium chloride SA (K-DUR,KLOR-CON) 20 MEQ tablet Take 1 tablet (20 mEq total) by mouth daily. 04/18/17   [name], MD  prochlorperazine (COMPAZINE) 10 MG tablet TAKE 1 TABLET (10 MG TOTAL) BY MOUTH EVERY 6 (SIX) HOURS AS NEEDED FOR NAUSEA OR VOMITING.  04/18/17   [name], MD  budesonide (PULMICORT) 0.5 MG/2ML nebulizer solution Take 2 mLs (0.5 mg total) by nebulization 2 (two) times daily. DX:  496 02/07/11 03/21/11  Parrett, Fonnie Mu, NP  esomeprazole (NEXIUM) 40 MG capsule Take 1 capsule (40 mg total) by mouth daily. 12/28/10 03/21/11  Elsie Stain, MD    Family History Family History  Problem Relation Age of Onset  . CAD Neg Hx        no known FHx of early CAD    Social History Social History   Tobacco Use  . Smoking status: Former Smoker    Packs/day: 1.00    Years: 50.00    Pack years: 50.00    Types: Cigarettes    Last attempt to quit: 01/09/2008    Years since quitting: 9.5  . Smokeless tobacco: Never Used  . Tobacco comment: rolled own cigarettes since age of 35,   Substance Use Topics  . Alcohol use: No  . Drug use: No     Allergies   Patient has no known allergies.   Review of Systems Review of Systems  Constitutional: Positive for fatigue.  Respiratory: Positive for cough (Chronic) and shortness of breath.   Gastrointestinal: Positive for abdominal pain, constipation, nausea and vomiting.  Genitourinary: Positive for difficulty urinating and dysuria (Chronic).  Neurological: Positive for dizziness.  All other systems reviewed and are negative.    Physical Exam Updated Vital Signs BP 127/70 (BP Location: Right Arm)   Pulse 69   Temp (!) 97.4 F (36.3 C) (Rectal)   Resp 15   SpO2 97%   Physical Exam  Constitutional: He is oriented to person, place, and time. He appears well-developed and well-nourished. No distress.  HENT:  Head: Normocephalic and atraumatic.  Nose: Nose normal.  Dry lips and mucous membranes  Eyes: Pupils are equal, round, and reactive to light. Conjunctivae and EOM are normal.  Neck: Normal range of motion.  Cardiovascular: Normal rate, regular rhythm, normal heart sounds and intact distal pulses.  2+ DP and radial pulses bilaterally. No LE edema or calf tenderness    Pulmonary/Chest: Effort normal. He has wheezes.  Faint end expiratory wheezing in lower lobes. SpO2 drops to 92% with movement in bed.   Abdominal: Soft. Bowel sounds are normal. There is  tenderness in the suprapubic area and left lower quadrant.  No G/R/R. No CVA tenderness. Negative Murphy's and McBurney's  Musculoskeletal: Normal range of motion.  Neurological: He is alert and oriented to person, place, and time.  Skin: Skin is warm and dry. Capillary refill takes less than 2 seconds.  Psychiatric: He has a normal mood and affect. His behavior is normal. Judgment and thought content normal.  Nursing note and vitals reviewed.    ED Treatments / Results  Labs (all labs ordered are listed, but only abnormal results are displayed) Labs Reviewed  COMPREHENSIVE METABOLIC PANEL - Abnormal; Notable for the following components:      Result Value   Potassium 3.1 (*)    Glucose, Bld 142 (*)    Calcium 8.4 (*)    All other components within normal limits  CBC WITH DIFFERENTIAL/PLATELET - Abnormal; Notable for the following components:   RBC 4.02 (*)    Hemoglobin 12.2 (*)    HCT 37.3 (*)    All other components within normal limits  URINALYSIS, ROUTINE W REFLEX MICROSCOPIC - Abnormal; Notable for the following components:   Color, Urine AMBER (*)    pH 9.0 (*)    Ketones, ur 5 (*)    Nitrite POSITIVE (*)    Bacteria, UA RARE (*)    All other components within normal limits  D-DIMER, QUANTITATIVE (NOT AT [facility]) - Abnormal; Notable for the following components:   D-Dimer, Quant 0.66 (*)    All other components within normal limits  URINE CULTURE  I-STAT CG4 LACTIC ACID, ED  I-STAT TROPONIN, ED  I-STAT CG4 LACTIC ACID, ED    EKG EKG Interpretation  Date/Time:  Friday July 26 2017 16:34:06 EDT Ventricular Rate:  66 PR Interval:    QRS Duration: 116 QT Interval:  424 QTC Calculation: 445 R Axis:   55 Text Interpretation:  Sinus rhythm Nonspecific intraventricular conduction  delay Confirmed by Davonna Belling [phone]) on 07/26/2017 8:20:16 PM   Radiology Dg Chest 2 View  Result Date: 07/26/2017 CLINICAL DATA:  Shortness of breath.  Bladder cancer. EXAM: CHEST - 2 VIEW COMPARISON:  08/03/2016 and chest CT dated 07/19/2017. FINDINGS: Borderline enlarged cardiac silhouette. Clear lungs with the exception of minimal linear density at both lung bases. Unremarkable bones. IMPRESSION: 1. Minimal linear atelectasis or scarring at both lung bases. 2. Stable borderline cardiomegaly. Electronically Signed   By: Claudie Revering M.D.   On: 07/26/2017 18:18    Procedures Procedures (including critical care time)  Medications Ordered in ED Medications  cefTRIAXone (ROCEPHIN) 1 g in sodium chloride 0.9 % 100 mL IVPB (0 g Intravenous Stopped 07/26/17 2023)  sodium chloride 0.9 % bolus 500 mL (0 mLs Intravenous Stopped 07/26/17 2303)  ipratropium-albuterol (DUONEB) 0.5-2.5 (3) MG/3ML nebulizer solution 3 mL (3 mLs Nebulization Given 07/26/17 2317)  methylPREDNISolone sodium succinate (SOLU-MEDROL) 125 mg/2 mL injection 125 mg (125 mg Intravenous Given 07/26/17 2326)     Initial Impression / Assessment and Plan / ED Course  I have reviewed the triage vital signs and the nursing notes.  Pertinent labs & imaging results that were available during my care of the patient were reviewed by me and considered in my medical decision making (see chart for details).  Clinical Course as of Jul 27 2335  Fri Jul 26, 2017  1859 Potassium(!): 3.1 [CG]  1900 Nitrite(!): POSITIVE [CG]  1900 Bacteria, UA(!): RARE [CG]  2018 Re-evaluated pt RN had noticed IV was out and unsure if any of  rocephin has been administered. Will re-order 1g rocephin, ambulate patient.    [CG]  2049  PT WAS AMBULATED AND PT WAS VERY WEAK AND STUMBLING.. PT STATES HE CAN NOT WALK STRAIGHT IT'S HARD FOR HIM TO CATCH HIS BALANCE, ALSO PT OXYGEN LEVEL WENT FROM 96% TO 89-90% .[name] PT WAS OUT OF BREATH FOR A BIT I TOLD THE PT TO  SIT ON THE BED AND JUST TAKE A BREAK TO CATCH HIS BREATH.  [place], [place] NT      [CG]  2155 Re-evaluated pt. He still feels "weak", states he could not walk well.    [CG]  2315 Normal if age adjusted   D-Dimer, Quant(!): 0.66 [CG]    Clinical Course User Index [CG] Kinnie Feil, PA-C   Work-up remarkable for hemoglobin 12.2 at baseline, K 3.1, positive nitrites in the urine, rare bacteria.  Will send urine culture and defer abx (rocephin ordered not administered in ER, IVF fell out). Chest x-ray, EKG, troponin, lactic acid WNL.  Upon reevaluation patient is noted to drop SPO2 to 92/93% with movement in the bed.  Will attempt to ambulate with pulse ox.   2300: Pt hypoxic with ambulation, NT noted he was dyspnic on exertion and needed to sit down.  Continues to feel weak.  Re-evaluated pt and he has minimal wheezing in lower lobes, desaturates with movement on bed, intermittent cough.  Given h/o recent cancer, will obtain d-dimer although he was no CP, LE edema or calf tenderness.  No signs of fluid overload on exam, no edema on CXR. Considering admission for COPD exacerbation.   2315: D-dimer normal if age adjusted.  Pt has advanced COPD, does not typically wear oxygen at home. Unfortunate delay with duoneb. Feel he will benefit from admission for breathing treatments, monitor SpO2 for COPD flare.  Pt discussed with Dr. Alvino Chapel.  Final Clinical Impressions(s) / ED Diagnoses   Final diagnoses:  COPD exacerbation [facility])    ED Discharge Orders    None       Arlean Hopping 07/26/17 2337    Davonna Belling, MD 07/27/17 [phone]

## 2017-07-26 NOTE — ED Notes (Addendum)
PT WAS AMBULATED AND PT WAS VERY WEAK AND STUMBLING.. PT STATES HE CAN NOT WALK STRAIGHT IT'S HARD FOR HIM TO CATCH HIS BALANCE, ALSO PT OXYGEN LEVEL WENT FROM 96% TO 89-90% .[name] PT WAS OUT OF BREATH FOR A BIT I TOLD THE PT TO SIT ON THE BED AND JUST TAKE A BREAK TO CATCH HIS BREATH.  [place], [name] NT

## 2017-07-27 ENCOUNTER — Observation Stay (HOSPITAL_BASED_OUTPATIENT_CLINIC_OR_DEPARTMENT_OTHER): Payer: Medicare HMO

## 2017-07-27 ENCOUNTER — Encounter (HOSPITAL_COMMUNITY): Payer: Self-pay | Admitting: Internal Medicine

## 2017-07-27 DIAGNOSIS — G43A0 Cyclical vomiting, not intractable: Secondary | ICD-10-CM

## 2017-07-27 DIAGNOSIS — J441 Chronic obstructive pulmonary disease with (acute) exacerbation: Secondary | ICD-10-CM

## 2017-07-27 DIAGNOSIS — I10 Essential (primary) hypertension: Secondary | ICD-10-CM

## 2017-07-27 DIAGNOSIS — I517 Cardiomegaly: Secondary | ICD-10-CM

## 2017-07-27 DIAGNOSIS — I5030 Unspecified diastolic (congestive) heart failure: Secondary | ICD-10-CM | POA: Diagnosis not present

## 2017-07-27 DIAGNOSIS — I5189 Other ill-defined heart diseases: Secondary | ICD-10-CM

## 2017-07-27 HISTORY — DX: Cardiomegaly: I51.7

## 2017-07-27 HISTORY — DX: Other ill-defined heart diseases: I51.89

## 2017-07-27 LAB — TROPONIN I
Troponin I: <0.03 ng/mL (ref <0.03)
Troponin I: <0.03 ng/mL (ref <0.03)
Troponin I: <0.03 ng/mL (ref <0.03)

## 2017-07-27 LAB — BASIC METABOLIC PANEL
ANION GAP: 8 (ref 5–15)
BUN: 10 mg/dL (ref 8–23)
CHLORIDE: 106 mmol/L (ref 98–111)
CO2: 25 mmol/L (ref 22–32)
Calcium: 8.6 mg/dL — ABNORMAL LOW (ref 8.9–10.3)
Creatinine, Ser: 0.78 mg/dL (ref 0.61–1.24)
GFR calc Af Amer: >60 mL/min (ref >60)
GLUCOSE: 351 mg/dL — AB (ref 70–99)
POTASSIUM: 3.8 mmol/L (ref 3.5–5.1)
Sodium: 139 mmol/L (ref 135–145)

## 2017-07-27 LAB — CBC
HEMATOCRIT: 39 % (ref 39.0–52.0)
HEMOGLOBIN: 12.6 g/dL — AB (ref 13.0–17.0)
MCH: 30.4 pg (ref 26.0–34.0)
MCHC: 32.3 g/dL (ref 30.0–36.0)
MCV: 94 fL (ref 78.0–100.0)
Platelets: 312 10*3/uL (ref 150–400)
RBC: 4.15 MIL/uL — ABNORMAL LOW (ref 4.22–5.81)
RDW: 14.5 % (ref 11.5–15.5)
WBC: 10.1 10*3/uL (ref 4.0–10.5)

## 2017-07-27 LAB — GLUCOSE, CAPILLARY
GLUCOSE-CAPILLARY: 392 mg/dL — AB (ref 70–99)
GLUCOSE-CAPILLARY: 312 mg/dL — AB (ref 70–99)
GLUCOSE-CAPILLARY: 241 mg/dL — AB (ref 70–99)
GLUCOSE-CAPILLARY: 290 mg/dL — AB (ref 70–99)
Glucose-Capillary: 286 mg/dL — ABNORMAL HIGH (ref 70–99)
Glucose-Capillary: 327 mg/dL — ABNORMAL HIGH (ref 70–99)

## 2017-07-27 LAB — HEPATIC FUNCTION PANEL
ALBUMIN: 3.6 g/dL (ref 3.5–5.0)
ALK PHOS: 50 U/L (ref 38–126)
ALT: 25 U/L (ref 0–44)
AST: 27 U/L (ref 15–41)
BILIRUBIN TOTAL: 0.6 mg/dL (ref 0.3–1.2)
Total Protein: 6.6 g/dL (ref 6.5–8.1)

## 2017-07-27 LAB — ECHOCARDIOGRAM COMPLETE
HEIGHTINCHES: 60 in
Weight: 2321 oz

## 2017-07-27 MED ORDER — POTASSIUM CHLORIDE CRYS ER 20 MEQ PO TBCR
20.0000 meq | EXTENDED_RELEASE_TABLET | Freq: Once | ORAL | Status: AC
  Administered 2017-07-27: 20 meq via ORAL
  Filled 2017-07-27: qty 1

## 2017-07-27 MED ORDER — ASPIRIN EC 81 MG PO TBEC
81.0000 mg | DELAYED_RELEASE_TABLET | Freq: Every day | ORAL | Status: DC
  Administered 2017-07-27 – 2017-07-28 (×2): 81 mg via ORAL
  Filled 2017-07-27 (×2): qty 1

## 2017-07-27 MED ORDER — PANTOPRAZOLE SODIUM 40 MG PO TBEC
40.0000 mg | DELAYED_RELEASE_TABLET | Freq: Every day | ORAL | Status: DC
  Administered 2017-07-27 – 2017-07-28 (×2): 40 mg via ORAL
  Filled 2017-07-27 (×2): qty 1

## 2017-07-27 MED ORDER — ALBUTEROL SULFATE (2.5 MG/3ML) 0.083% IN NEBU: 2.5000 mg | INHALATION_SOLUTION | RESPIRATORY_TRACT | Status: DC | PRN

## 2017-07-27 MED ORDER — HYOSCYAMINE SULFATE 0.125 MG PO TABS
0.1250 mg | ORAL_TABLET | ORAL | Status: DC | PRN
  Filled 2017-07-27: qty 1

## 2017-07-27 MED ORDER — ONDANSETRON HCL 4 MG/2ML IJ SOLN: 4.0000 mg | Freq: Four times a day (QID) | INTRAMUSCULAR | Status: DC | PRN

## 2017-07-27 MED ORDER — TAMSULOSIN HCL 0.4 MG PO CAPS
0.4000 mg | ORAL_CAPSULE | Freq: Every evening | ORAL | Status: DC
  Administered 2017-07-27: 0.4 mg via ORAL
  Filled 2017-07-27: qty 1

## 2017-07-27 MED ORDER — IPRATROPIUM-ALBUTEROL 0.5-2.5 (3) MG/3ML IN SOLN: 3.0000 mL | Freq: Three times a day (TID) | RESPIRATORY_TRACT | Status: DC

## 2017-07-27 MED ORDER — IPRATROPIUM BROMIDE 0.02 % IN SOLN: 0.5000 mg | RESPIRATORY_TRACT | Status: DC

## 2017-07-27 MED ORDER — FESOTERODINE FUMARATE ER 8 MG PO TB24
8.0000 mg | ORAL_TABLET | Freq: Every day | ORAL | Status: DC
  Administered 2017-07-27 – 2017-07-28 (×2): 8 mg via ORAL
  Filled 2017-07-27 (×2): qty 1

## 2017-07-27 MED ORDER — ROSUVASTATIN CALCIUM 20 MG PO TABS
20.0000 mg | ORAL_TABLET | Freq: Every day | ORAL | Status: DC
  Administered 2017-07-27 – 2017-07-28 (×2): 20 mg via ORAL
  Filled 2017-07-27 (×2): qty 1

## 2017-07-27 MED ORDER — SODIUM CHLORIDE 0.9 % IV SOLN
1.0000 g | Freq: Every day | INTRAVENOUS | Status: DC
  Filled 2017-07-27: qty 10

## 2017-07-27 MED ORDER — FOLIC ACID 1 MG PO TABS
1.0000 mg | ORAL_TABLET | Freq: Every day | ORAL | Status: DC
  Administered 2017-07-27 – 2017-07-28 (×2): 1 mg via ORAL
  Filled 2017-07-27 (×2): qty 1

## 2017-07-27 MED ORDER — FUROSEMIDE 10 MG/ML IJ SOLN
20.0000 mg | Freq: Once | INTRAMUSCULAR | Status: AC
  Administered 2017-07-27: 20 mg via INTRAVENOUS
  Filled 2017-07-27: qty 2

## 2017-07-27 MED ORDER — PROCHLORPERAZINE MALEATE 10 MG PO TABS: 10.0000 mg | ORAL_TABLET | Freq: Four times a day (QID) | ORAL | Status: DC | PRN

## 2017-07-27 MED ORDER — ALBUTEROL SULFATE (2.5 MG/3ML) 0.083% IN NEBU: 2.5000 mg | INHALATION_SOLUTION | RESPIRATORY_TRACT | Status: DC

## 2017-07-27 MED ORDER — MOMETASONE FURO-FORMOTEROL FUM 200-5 MCG/ACT IN AERO
2.0000 | INHALATION_SPRAY | Freq: Two times a day (BID) | RESPIRATORY_TRACT | Status: DC
  Administered 2017-07-27 – 2017-07-28 (×3): 2 via RESPIRATORY_TRACT
  Filled 2017-07-27: qty 8.8

## 2017-07-27 MED ORDER — ENOXAPARIN SODIUM 40 MG/0.4ML ~~LOC~~ SOLN
40.0000 mg | SUBCUTANEOUS | Status: DC
  Administered 2017-07-27 – 2017-07-28 (×2): 40 mg via SUBCUTANEOUS
  Filled 2017-07-27 (×2): qty 0.4

## 2017-07-27 MED ORDER — ONDANSETRON HCL 4 MG PO TABS: 4.0000 mg | ORAL_TABLET | Freq: Four times a day (QID) | ORAL | Status: DC | PRN

## 2017-07-27 MED ORDER — AMLODIPINE BESYLATE 10 MG PO TABS
10.0000 mg | ORAL_TABLET | Freq: Every day | ORAL | Status: DC
  Administered 2017-07-27 – 2017-07-28 (×2): 10 mg via ORAL
  Filled 2017-07-27 (×2): qty 1

## 2017-07-27 MED ORDER — TRAMADOL HCL 50 MG PO TABS: 50.0000 mg | ORAL_TABLET | Freq: Three times a day (TID) | ORAL | Status: DC | PRN

## 2017-07-27 MED ORDER — IPRATROPIUM-ALBUTEROL 0.5-2.5 (3) MG/3ML IN SOLN
3.0000 mL | Freq: Three times a day (TID) | RESPIRATORY_TRACT | Status: DC
  Administered 2017-07-27 – 2017-07-28 (×2): 3 mL via RESPIRATORY_TRACT
  Filled 2017-07-27 (×2): qty 3

## 2017-07-27 MED ORDER — INSULIN ASPART 100 UNIT/ML ~~LOC~~ SOLN
0.0000 [IU] | Freq: Three times a day (TID) | SUBCUTANEOUS | Status: DC
  Administered 2017-07-27: 7 [IU] via SUBCUTANEOUS
  Administered 2017-07-27: 3 [IU] via SUBCUTANEOUS
  Administered 2017-07-27: 9 [IU] via SUBCUTANEOUS
  Administered 2017-07-28: 2 [IU] via SUBCUTANEOUS
  Administered 2017-07-28: 3 [IU] via SUBCUTANEOUS

## 2017-07-27 MED ORDER — ACETAMINOPHEN 325 MG PO TABS: 650.0000 mg | ORAL_TABLET | Freq: Four times a day (QID) | ORAL | Status: DC | PRN

## 2017-07-27 MED ORDER — GUAIFENESIN 100 MG/5ML PO SOLN
5.0000 mL | ORAL | Status: DC | PRN
  Administered 2017-07-27: 100 mg via ORAL
  Filled 2017-07-27: qty 10

## 2017-07-27 MED ORDER — IPRATROPIUM-ALBUTEROL 0.5-2.5 (3) MG/3ML IN SOLN
3.0000 mL | RESPIRATORY_TRACT | Status: DC
  Administered 2017-07-27 (×2): 3 mL via RESPIRATORY_TRACT
  Filled 2017-07-27 (×2): qty 3

## 2017-07-27 MED ORDER — POTASSIUM CHLORIDE CRYS ER 20 MEQ PO TBCR
20.0000 meq | EXTENDED_RELEASE_TABLET | Freq: Every day | ORAL | Status: DC
  Administered 2017-07-28: 20 meq via ORAL
  Filled 2017-07-27: qty 1

## 2017-07-27 MED ORDER — ACETAMINOPHEN 650 MG RE SUPP: 650.0000 mg | Freq: Four times a day (QID) | RECTAL | Status: DC | PRN

## 2017-07-27 MED ORDER — NITROGLYCERIN 0.4 MG SL SUBL: 0.4000 mg | SUBLINGUAL_TABLET | SUBLINGUAL | Status: DC | PRN

## 2017-07-27 MED ORDER — LOSARTAN POTASSIUM 50 MG PO TABS
50.0000 mg | ORAL_TABLET | Freq: Every day | ORAL | Status: DC
  Administered 2017-07-27 – 2017-07-28 (×2): 50 mg via ORAL
  Filled 2017-07-27 (×2): qty 1

## 2017-07-27 MED ORDER — SODIUM CHLORIDE 0.9 % IV SOLN
1.0000 g | INTRAVENOUS | Status: DC
  Administered 2017-07-27: 1 g via INTRAVENOUS
  Filled 2017-07-27: qty 10
  Filled 2017-07-27: qty 1

## 2017-07-27 MED ORDER — LINACLOTIDE 72 MCG PO CAPS
72.0000 ug | ORAL_CAPSULE | Freq: Every morning | ORAL | Status: DC
Start: 2017-07-27 — End: 2017-07-28
  Administered 2017-07-27 – 2017-07-28 (×2): 72 ug via ORAL
  Filled 2017-07-27 (×2): qty 1

## 2017-07-27 NOTE — ED Notes (Signed)
ED TO INPATIENT HANDOFF REPORT  Name/Age/Gender [name] 72 y.o. male  Code Status Code Status History    Date Active Date Inactive Code Status Order ID Comments User Context   08/03/2016 1120 08/08/2016 1826 Full Code [id number]  Elease Hashimoto ED   06/01/2015 1440 06/05/2015 1633 Full Code [id number]  Willia Craze, NP ED   12/27/2014 0924 12/27/2014 2029 Full Code [id number]  Jettie Booze, MD Inpatient   12/23/2014 1744 12/27/2014 0924 Full Code [id number]  Thurnell Lose, MD Inpatient   05/01/2014 0243 05/06/2014 1235 Full Code [id number]  Theressa Millard, MD Inpatient   08/26/2013 1837 08/31/2013 1954 Full Code [id number]  Donne Hazel, MD ED   08/03/2013 1620 08/04/2013 1834 Full Code [id number]  [name], Peter M, MD Inpatient   08/01/2013 1652 08/03/2013 1608 Full Code [id number]  [name], Peter M, MD Inpatient   12/20/2010 1326 12/27/2010 1835 Full Code [id number]  Cheryln Manly, RN ED      Home/SNF/Other Home  Chief Complaint N/V; Abdominal Pain  Level of Care/Admitting Diagnosis ED Disposition    ED Disposition Condition [place] [facility] Area: [facility] [100102]  Level of Care: Telemetry [5]  Admit to tele based on following criteria: Monitor for Ischemic changes  Diagnosis: COPD exacerbation [facility]) [122449]  Admitting Physician: Rise Patience [phone]  Attending Physician: Rise Patience [email or web address]  PT Class (Do Not Modify): Observation [104]  PT Acc Code (Do Not Modify): Observation [10022]       Medical History Past Medical History:  Diagnosis Date  . Allergic rhinitis   . CAD (coronary artery disease), native coronary artery cardiologist-  [name]   a. thought due to vasospasm . cath - CAD LAD 40-50%, LCX 40%, RCA 25% EF 65%. lexiscan myoview EF 68% w/o evidence of ischemia or infarct b. STEMI s/p BMS-mid RCA c. BMS x 2- prox RCA, PLOM  . Centriacinar emphysema ([place])   . COPD, group C, by GOLD 2017  classification [facility]) pulmonologist-- dr [email or web address]   former smoker--- severe OSA w/ hx multiple hospital admissions w/ exacerbation's  . Dyspnea   . Dysuria   . Full dentures   . GERD (gastroesophageal reflux disease)   . History of non-ST elevation myocardial infarction (NSTEMI) 06/19/2009   in setting COPD w/ exacerbation;  negative myoview for ischemia;  previously had cardiac cath 06-08-2009 w/ diffuse nonobtructive CAD and  normal LVF  . History of prostatitis   . History of ST elevation myocardial infarction (STEMI) 02/15/2012   in setting of COPD w/ exacerbation; per cardiac cath PCI and stenting to RCA  . HTN (hypertension)   . Hyperlipidemia   . Lower abdominal pain   . 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 Type 2 diabetes mellitus ([place])   . Wears glasses     Allergies No Known Allergies  IV Location/Drains/Wounds Patient Lines/Drains/Airways Status   Active Line/Drains/Airways    Name:   Placement date:   Placement time:   Site:   Days:   Peripheral IV 07/26/17 Lateral Antecubital   07/26/17    2202    Antecubital   1   Urethral Catheter Herrick Latex;Straight-tip 20 Fr.   03/15/17    1008    Latex;Straight-tip   134   Ureteral Drain/Stent Left ureter 6 Fr.  10/19/16    0846    Left ureter   281   Incision (Closed) 10/19/16 Penis   10/19/16    0903     281   Incision (Closed) 03/15/17 Penis   03/15/17    0728     134          Labs/Imaging Results for orders placed or performed during the hospital encounter of 07/26/17 (from the past 48 hour(s))  Comprehensive metabolic panel     Status: Abnormal   Collection Time: 07/26/17  5:00 PM  Result Value Ref Range   Sodium 140 135 - 145 mmol/L   Potassium 3.1 (L) 3.5 - 5.1 mmol/L   Chloride 107 98 - 111 mmol/L    Comment: Please note change in reference range.   CO2 25 22 - 32 mmol/L    Glucose, Bld 142 (H) 70 - 99 mg/dL    Comment: Please note change in reference range.   BUN 10 8 - 23 mg/dL    Comment: Please note change in reference range.   Creatinine, Ser 0.66 0.61 - 1.24 mg/dL   Calcium 8.4 (L) 8.9 - 10.3 mg/dL   Total Protein 6.6 6.5 - 8.1 g/dL   Albumin 3.6 3.5 - 5.0 g/dL   AST 26 15 - 41 U/L   ALT 24 0 - 44 U/L    Comment: Please note change in reference range.   Alkaline Phosphatase 47 38 - 126 U/L   Total Bilirubin 0.6 0.3 - 1.2 mg/dL   GFR calc non Af Amer >60 >60 mL/min   GFR calc Af Amer >60 >60 mL/min    Comment: (NOTE) The eGFR has been calculated using the CKD EPI equation. This calculation has not been validated in all clinical situations. eGFR's persistently <60 mL/min signify possible Chronic Kidney Disease.    Anion gap 8 5 - 15    Comment: Performed at [facility], [place] [address] [place], [place] [address]  CBC WITH DIFFERENTIAL     Status: Abnormal   Collection Time: 07/26/17  5:00 PM  Result Value Ref Range   WBC 9.1 4.0 - 10.5 K/uL   RBC 4.02 (L) 4.22 - 5.81 MIL/uL   Hemoglobin 12.2 (L) 13.0 - 17.0 g/dL   HCT 37.3 (L) 39.0 - 52.0 %   MCV 92.8 78.0 - 100.0 fL   MCH 30.3 26.0 - 34.0 pg   MCHC 32.7 30.0 - 36.0 g/dL   RDW 14.4 11.5 - 15.5 %   Platelets 304 150 - 400 K/uL   Neutrophils Relative % 79 %   Neutro Abs 7.1 1.7 - 7.7 K/uL   Lymphocytes Relative 11 %   Lymphs Abs 1.0 0.7 - 4.0 K/uL   Monocytes Relative 9 %   Monocytes Absolute 0.8 0.1 - 1.0 K/uL   Eosinophils Relative 1 %   Eosinophils Absolute 0.1 0.0 - 0.7 K/uL   Basophils Relative 0 %   Basophils Absolute 0.0 0.0 - 0.1 K/uL    Comment: Performed at [facility], [place] [address], [place], [place] [address]  I-stat troponin, ED (not at [facility])     Status: None   Collection Time: 07/26/17  5:42 PM  Result Value Ref Range   Troponin i, poc 0.00 0.00 - 0.08 ng/mL   Comment 3            Comment: Due to the release kinetics of  cTnI, a negative result within the first hours of the  onset of symptoms does not rule out myocardial infarction with certainty. If myocardial infarction is still suspected, repeat the test at appropriate intervals.   I-Stat CG4 Lactic Acid, ED  (not at  [facility])     Status: None   Collection Time: 07/26/17  5:44 PM  Result Value Ref Range   Lactic Acid, Venous 0.75 0.5 - 1.9 mmol/L  Urinalysis, Routine w reflex microscopic     Status: Abnormal   Collection Time: 07/26/17  6:08 PM  Result Value Ref Range   Color, Urine Toshiyuki Fredell (A) YELLOW    Comment: BIOCHEMICALS MAY BE AFFECTED BY COLOR   APPearance CLEAR CLEAR   Specific Gravity, Urine 1.011 1.005 - 1.030   pH 9.0 (H) 5.0 - 8.0   Glucose, UA NEGATIVE NEGATIVE mg/dL   Hgb urine dipstick NEGATIVE NEGATIVE   Bilirubin Urine NEGATIVE NEGATIVE   Ketones, ur 5 (A) NEGATIVE mg/dL   Protein, ur NEGATIVE NEGATIVE mg/dL   Nitrite POSITIVE (A) NEGATIVE   Leukocytes, UA NEGATIVE NEGATIVE   RBC / HPF 0-5 0 - 5 RBC/hpf   WBC, UA 0-5 0 - 5 WBC/hpf   Bacteria, UA RARE (A) NONE SEEN   Squamous Epithelial / LPF 0-5 0 - 5    Comment: Performed at [facility], [place] [address], [place], [place] [address]  D-dimer, quantitative (not at [facility])     Status: Abnormal   Collection Time: 07/26/17  9:03 PM  Result Value Ref Range   D-Dimer, Quant 0.66 (H) 0.00 - 0.50 ug/mL-FEU    Comment: (NOTE) At the manufacturer cut-off of 0.50 ug/mL FEU, this assay has been documented to exclude PE with a sensitivity and negative predictive value of 97 to 99%.  At this time, this assay has not been approved by the FDA to exclude DVT/VTE. Results should be correlated with clinical presentation. Performed at [facility], [place] [address], [place], [place] [address]   I-Stat CG4 Lactic Acid, ED  (not at  [facility])     Status: None   Collection Time: 07/26/17 10:20 PM  Result Value Ref Range   Lactic Acid, Venous 1.48 0.5 - 1.9 mmol/L    Dg Chest 2 View  Result Date: 07/26/2017 CLINICAL DATA:  Shortness of breath.  Bladder cancer. EXAM: CHEST - 2 VIEW COMPARISON:  08/03/2016 and chest CT dated 07/19/2017. FINDINGS: Borderline enlarged cardiac silhouette. Clear lungs with the exception of minimal linear density at both lung bases. Unremarkable bones. IMPRESSION: 1. Minimal linear atelectasis or scarring at both lung bases. 2. Stable borderline cardiomegaly. Electronically Signed   By: Claudie Revering M.D.   On: 07/26/2017 18:18    Pending Labs Unresulted Labs (From admission, onward)   Start     Ordered   07/26/17 1726  Urine culture  STAT,   STAT     07/26/17 1726      Vitals/Pain Today's Vitals   07/26/17 1830 07/26/17 1850 07/26/17 2234 07/26/17 2317  BP: 136/70 136/70 127/70   Pulse: 69 68 69   Resp: 20 19 15    Temp:      TempSrc:      SpO2: 93% 95% 96% 97%  PainSc:        Isolation Precautions No active isolations  Medications Medications  cefTRIAXone (ROCEPHIN) 1 g in sodium chloride 0.9 % 100 mL IVPB (0 g Intravenous Stopped 07/26/17 2023)  sodium chloride 0.9 % bolus 500 mL (0 mLs Intravenous Stopped 07/26/17 2303)  ipratropium-albuterol (DUONEB) 0.5-2.5 (3) MG/3ML  nebulizer solution 3 mL (3 mLs Nebulization Given 07/26/17 2317)  methylPREDNISolone sodium succinate (SOLU-MEDROL) 125 mg/2 mL injection 125 mg (125 mg Intravenous Given 07/26/17 2326)    Mobility walks

## 2017-07-27 NOTE — H&P (Addendum)
History and Physical    [name] FXT:[id number] DOB: Apr 04, 1945 DOA: 07/26/2017  PCP: [name], MD  Patient coming from:.  Home.  Chief Complaint: Nausea vomiting.  Shortness of breath.  HPI: [name] is a 72 y.o. male with history of CAD status post stenting, severe COPD, hypertension, diastolic dysfunction, urothelial cancer status post chemoradiation and TURBT had recent CT chest and abdomen done 2 days ago by patient's oncologist which was largely unremarkable presents to the ER with sudden onset of nausea vomiting which happened this morning after patient took his pain medications without eating.  Following which patient has been shortness of breath.  Has been having wheezing denies any chest pain or productive cough.  ED Course: In the ER patient on exam had wheezing.  Abdomen appears benign.  On ambulation patient became hypoxic.  EKG shows normal sinus rhythm with ST-T changes which is diffuse.  Chest x-ray shows cardiomegaly otherwise unremarkable.  Patient was given nebulizer treatment and steroid for COPD exacerbation and admitted for further observation.  Denies any chest pain on my exam.  Review of Systems: As per HPI, rest all negative.   Past Medical History:  Diagnosis Date  . Allergic rhinitis   . CAD (coronary artery disease), native coronary artery cardiologist-  [name]   a. thought due to vasospasm . cath - CAD LAD 40-50%, LCX 40%, RCA 25% EF 65%. lexiscan myoview EF 68% w/o evidence of ischemia or infarct b. STEMI s/p BMS-mid RCA c. BMS x 2- prox RCA, PLOM  . Centriacinar emphysema ([place])   . COPD, group C, by GOLD 2017 classification [facility]) pulmonologist-- dr mcquid   former smoker--- severe OSA w/ hx multiple hospital admissions w/ exacerbation's  . Dyspnea   . Dysuria   . Full dentures   . GERD (gastroesophageal reflux disease)   . History of non-ST elevation myocardial infarction (NSTEMI) 06/19/2009   in setting COPD w/ exacerbation;  negative myoview  for ischemia;  previously had cardiac cath 06-08-2009 w/ diffuse nonobtructive CAD and  normal LVF  . History of prostatitis   . History of ST elevation myocardial infarction (STEMI) 02/15/2012   in setting of COPD w/ exacerbation; per cardiac cath PCI and stenting to RCA  . HTN (hypertension)   . Hyperlipidemia   . Lower abdominal pain   . Recurrent malignant neoplasm of bladder [facility]) urologist-  dr [name]   previously dx 08/ 2018 and 10/ 2018 w/ high grade urothelial carcinoma with no muscle involvement  . Type 2 diabetes mellitus ([place])   . Wears glasses     Past Surgical History:  Procedure Laterality Date  . CARDIAC CATHETERIZATION N/A 12/27/2014   Procedure: Left Heart Cath and Coronary Angiography;  Surgeon: Jettie Booze, MD;  Location: [facility] CV LAB;  Service: Cardiovascular;  Laterality: N/A; ost.LAD to pLAD 40%, ost.D2 to D2 80%, mCFx 50%, mild to moderate left sided disease, patent RCA stents, normal LVF  . CARDIAC CATHETERIZATION  06-08-2009   dr cooper   diffuse non-obstructive CAD , normal LVF  . CARDIOVASCULAR STRESS TEST  06-25-2012  [name]   intermediate risk nuclear stress study w/ medium-sized, mild primary reversible basal to mid inferior perfusion defect suggestive of ischemia/  normal LV function and wall motion ,ef 63%/  pt scheduled for cardiac cath  . CYSTOSCOPY W/ RETROGRADES Bilateral 09/07/2016   Procedure: CYSTOSCOPY WITH BILATERAL RETROGRADE PYELOGRAM;  Surgeon: [name], MD;  Location: [facility];  Service: Urology;  Laterality:  Bilateral;  . CYSTOSCOPY W/ RETROGRADES Bilateral 03/15/2017   Procedure: CYSTOSCOPY WITH RETROGRADE PYELOGRAM;  Surgeon: [name], MD;  Location: [facility];  Service: Urology;  Laterality: Bilateral;  . CYSTOSCOPY WITH RETROGRADE PYELOGRAM, URETEROSCOPY AND STENT PLACEMENT Left 10/19/2016   Procedure: CYSTOSCOPY WITH RETROGRADE PYELOGRAM, URETEROSCOPY AND STENT PLACEMENT;  Surgeon:  [name], MD;  Location: [facility];  Service: Urology;  Laterality: Left;  . left foot surgery     repair - and ankle   . LEFT HEART CATHETERIZATION WITH CORONARY ANGIOGRAM N/A 02/15/2012   Procedure: LEFT HEART CATHETERIZATION WITH CORONARY ANGIOGRAM;  Surgeon: Peter M [name], MD;  Location: [facility] CATH LAB;  Service: Cardiovascular;  Laterality: N/A;  single vessel obstructive disease of the mid RCA, normal LVF  . LEFT HEART CATHETERIZATION WITH CORONARY ANGIOGRAM N/A 07/01/2012   Procedure: LEFT HEART CATHETERIZATION WITH CORONARY ANGIOGRAM;  Surgeon: Peter M [name], MD;  Location: [facility] CATH LAB;  Service: Cardiovascular;  Laterality: N/A;  class III angina and positive stress test; patent mRCA stent;  severe obstructive single vessel disease to new lesion proximal RCA & PLOM branch; normal LVF  . LEFT HEART CATHETERIZATION WITH CORONARY ANGIOGRAM N/A 08/03/2013   Procedure: LEFT HEART CATHETERIZATION WITH CORONARY ANGIOGRAM;  Surgeon: Peter M [name], MD;  Location: [facility] CATH LAB;  Service: Cardiovascular;  Laterality: N/A;  nonobstuctive CAD, normal LVF, stents patent mRCA and pRCA, mild diffuse disease up to 30% of stent in the PLOM  . PERCUTANEOUS CORONARY STENT INTERVENTION (PCI-S) N/A 02/15/2012   Procedure: PERCUTANEOUS CORONARY STENT INTERVENTION (PCI-S);  Surgeon: Peter M [name], MD;  Location: [facility] CATH LAB;  Service: Cardiovascular;  Laterality: N/A;   BMS x1 to midRCA  . PERCUTANEOUS CORONARY STENT INTERVENTION (PCI-S)  07/01/2012   Procedure: PERCUTANEOUS CORONARY STENT INTERVENTION (PCI-S);  Surgeon: Peter M [name], MD;  Location: [facility] CATH LAB;  Service: Cardiovascular;;  BMS to pRCA; BMS to PLOM  . TRANSTHORACIC ECHOCARDIOGRAM  08/05/2016   moderate LVH, ef 18-56%, grade 1 diastolic dysfunction/ mild PR and TR, trivial pericardial effusion/   . TRANSURETHRAL RESECTION OF BLADDER TUMOR N/A 09/07/2016   Procedure: TRANSURETHRAL RESECTION OF BLADDER TUMOR (TURBT);  Surgeon: [name],  MD;  Location: [facility];  Service: Urology;  Laterality: N/A;  . TRANSURETHRAL RESECTION OF BLADDER TUMOR N/A 10/19/2016   Procedure: TRANSURETHRAL RESECTION OF BLADDER TUMOR (TURBT);  Surgeon: [name], MD;  Location: [facility];  Service: Urology;  Laterality: N/A;  . TRANSURETHRAL RESECTION OF BLADDER TUMOR N/A 03/15/2017   Procedure: TRANSURETHRAL RESECTION OF BLADDER TUMOR (TURBT);  Surgeon: [name], MD;  Location: [facility];  Service: Urology;  Laterality: N/A;     reports that he quit smoking about 9 years ago. His smoking use included cigarettes. He has a 50.00 pack-year smoking history. He has never used smokeless tobacco. He reports that he does not drink alcohol or use drugs.  No Known Allergies  Family History  Problem Relation Age of Onset  . CAD Neg Hx        no known FHx of early CAD    Prior to Admission medications   Medication Sig Start Date End Date Taking? Authorizing Provider  albuterol (PROVENTIL HFA) 108 (90 BASE) MCG/ACT inhaler Inhale 2 puffs into the lungs 2 (two) times daily. 05/06/14  Yes Robbie Lis, MD  albuterol (PROVENTIL) (2.5 MG/3ML) 0.083% nebulizer solution Take 2.5 mg by nebulization every 6 (six) hours as needed for wheezing or  shortness of breath.   Yes [provider]  amLODipine (NORVASC) 10 MG tablet Take 10 mg by mouth daily.  04/07/17  Yes [provider]  arformoterol (BROVANA) 15 MCG/2ML NEBU Take 15 mcg by nebulization 2 (two) times daily.   Yes [provider]  aspirin EC 81 MG tablet Take 81 mg by mouth daily.   Yes [provider]  diclofenac sodium (VOLTAREN) 1 % GEL Apply 4 g topically 4 (four) times daily as needed (knee pain).   Yes [provider]  folic acid (FOLVITE) 1 MG tablet Take 1 mg by mouth daily. 05/03/17  Yes [provider]  losartan (COZAAR) 50 MG tablet Take 50 mg by mouth daily. 06/08/17  Yes [provider]  metFORMIN  (GLUCOPHAGE) 500 MG tablet Take 500 mg by mouth 2 (two) times daily with a meal.    Yes [provider]  Multiple Vitamins-Minerals (MULTIVITAMIN ADULTS) TABS Take 1 tablet by mouth daily.   Yes [provider]  nitroGLYCERIN (NITROSTAT) 0.4 MG SL tablet Place 1 tablet (0.4 mg total) under the tongue every 5 (five) minutes as needed for chest pain. 01/05/15  Yes [name], PA  rosuvastatin (CRESTOR) 20 MG tablet TAKE 20MG  1 TABLET DAILY FOR ELEVATED CHOLESTEROL 06/01/17  Yes [provider]  SYMBICORT 160-4.5 MCG/ACT inhaler TAKE 2 PUFFS BY MOUTH TWICE A DAY 05/29/17  Yes [provider]  tamsulosin (FLOMAX) 0.4 MG CAPS capsule Take 2 capsules (0.8 mg total) by mouth every evening. Patient taking differently: Take 0.4 mg by mouth every evening.  08/08/16  Yes Waldron Session, MD  tiotropium (SPIRIVA HANDIHALER) 18 MCG inhalation capsule Place 1 capsule (18 mcg total) into inhaler and inhale daily. 05/06/14  Yes Robbie Lis, MD  TOVIAZ 8 MG TB24 tablet Take 8 mg by mouth daily.  06/16/17  Yes [provider]  traMADol (ULTRAM) 50 MG tablet Take 50 mg by mouth every 8 (eight) hours as needed for moderate pain.   Yes [provider]  Vitamin D, Ergocalciferol, (DRISDOL) 50000 units CAPS capsule 1 PO ONCE A WEEK 03/28/17  Yes [provider]  acetaminophen (TYLENOL) 500 MG tablet Take 500 mg by mouth every 8 (eight) hours as needed for mild pain, moderate pain, fever or headache.    [provider]  cephALEXin (KEFLEX) 500 MG capsule Take 1 capsule (500 mg total) by mouth 3 (three) times daily. Patient not taking: Reported on 07/26/2017 06/24/17   Jola Schmidt, MD  hyoscyamine (LEVSIN, ANASPAZ) 0.125 MG tablet Take 1 tablet (0.125 mg total) by mouth every 4 (four) hours as needed. 07/02/17   Hayden Pedro, PA-C  ibuprofen (ADVIL,MOTRIN) 200 MG tablet Take 200 mg by mouth every 8 (eight) hours as needed for fever, headache, mild pain,  moderate pain or cramping.    [provider]  LINZESS 72 MCG capsule Take 72 mcg by mouth every morning. 05/29/17   [provider]  meloxicam (MOBIC) 15 MG tablet Take 15 mg by mouth daily. 05/07/17   [provider]  omeprazole (PRILOSEC) 20 MG capsule Take 1 capsule (20 mg total) by mouth 2 (two) times daily before a meal. 10/08/16   [name], Peter M, MD  phenazopyridine (PYRIDIUM) 200 MG tablet Take 1 tablet (200 mg total) by mouth 3 (three) times daily with meals. 07/02/17   Hayden Pedro, PA-C  potassium chloride SA (K-DUR,KLOR-CON) 20 MEQ tablet Take 1 tablet (20 mEq total) by mouth daily. 04/18/17  [name], MD  prochlorperazine (COMPAZINE) 10 MG tablet TAKE 1 TABLET (10 MG TOTAL) BY MOUTH EVERY 6 (SIX) HOURS AS NEEDED FOR NAUSEA OR VOMITING. 04/18/17   [name], MD  budesonide (PULMICORT) 0.5 MG/2ML nebulizer solution Take 2 mLs (0.5 mg total) by nebulization 2 (two) times daily. DX:  496 02/07/11 03/21/11  Parrett, Fonnie Mu, NP  esomeprazole (NEXIUM) 40 MG capsule Take 1 capsule (40 mg total) by mouth daily. 12/28/10 03/21/11  Elsie Stain, MD    Physical Exam: Vitals:   07/26/17 1850 07/26/17 2234 07/26/17 2317 07/27/17 0103  BP: 136/70 127/70  (!) 150/76  Pulse: 68 69  73  Resp: 19 15  18   Temp:    98.8 F (37.1 C)  TempSrc:    Oral  SpO2: 95% 96% 97% 95%      Constitutional: Moderately built and nourished. Vitals:   07/26/17 1850 07/26/17 2234 07/26/17 2317 07/27/17 0103  BP: 136/70 127/70  (!) 150/76  Pulse: 68 69  73  Resp: 19 15  18   Temp:    98.8 F (37.1 C)  TempSrc:    Oral  SpO2: 95% 96% 97% 95%   Eyes: Anicteric no pallor. ENMT: No discharge from the ears eyes nose or mouth. Neck: No JVD appreciated no mass felt. Respiratory: Mild expiratory wheeze and no crepitations. Cardiovascular: S1-S2 heard no murmurs appreciated. Abdomen: Soft nontender bowel sounds present.  Musculoskeletal: No edema.  No joint  effusion. Skin: No rash. Neurologic: Alert awake oriented to time place and person.  Moves all extremities. Psychiatric: Appears normal.  Normal affect.   Labs on Admission: I have personally reviewed following labs and imaging studies  CBC: Recent Labs  Lab 07/26/17 1700  WBC 9.1  NEUTROABS 7.1  HGB 12.2*  HCT 37.3*  MCV 92.8  PLT 970   Basic Metabolic Panel: Recent Labs  Lab 07/26/17 1700  NA 140  K 3.1*  CL 107  CO2 25  GLUCOSE 142*  BUN 10  CREATININE 0.66  CALCIUM 8.4*   GFR: Estimated Creatinine Clearance: 69.9 mL/min (by C-G formula based on SCr of 0.66 mg/dL). Liver Function Tests: Recent Labs  Lab 07/26/17 1700  AST 26  ALT 24  ALKPHOS 47  BILITOT 0.6  PROT 6.6  ALBUMIN 3.6   No results for input(s): LIPASE, AMYLASE in the last 168 hours. No results for input(s): AMMONIA in the last 168 hours. Coagulation Profile: No results for input(s): INR, PROTIME in the last 168 hours. Cardiac Enzymes: No results for input(s): CKTOTAL, CKMB, CKMBINDEX, TROPONINI in the last 168 hours. BNP (last 3 results) No results for input(s): PROBNP in the last 8760 hours. HbA1C: No results for input(s): HGBA1C in the last 72 hours. CBG: No results for input(s): GLUCAP in the last 168 hours. Lipid Profile: No results for input(s): CHOL, HDL, LDLCALC, TRIG, CHOLHDL, LDLDIRECT in the last 72 hours. Thyroid Function Tests: No results for input(s): TSH, T4TOTAL, FREET4, T3FREE, THYROIDAB in the last 72 hours. Anemia Panel: No results for input(s): VITAMINB12, FOLATE, FERRITIN, TIBC, IRON, RETICCTPCT in the last 72 hours. Urine analysis:    Component Value Date/Time   COLORURINE AMBER (A) 07/26/2017 1808   APPEARANCEUR CLEAR 07/26/2017 1808   LABSPEC 1.011 07/26/2017 1808   PHURINE 9.0 (H) 07/26/2017 1808   GLUCOSEU NEGATIVE 07/26/2017 1808   GLUCOSEU NEGATIVE 03/01/2015 1719   HGBUR NEGATIVE 07/26/2017 1808   BILIRUBINUR NEGATIVE 07/26/2017 1808   KETONESUR 5 (A)  07/26/2017 1808  PROTEINUR NEGATIVE 07/26/2017 1808   UROBILINOGEN 0.2 03/01/2015 1719   NITRITE POSITIVE (A) 07/26/2017 1808   LEUKOCYTESUR NEGATIVE 07/26/2017 1808   Sepsis Labs: @LABRCNTIP (procalcitonin:4,lacticidven:4) )No results found for this or any previous visit (from the past 240 hour(s)).   Radiological Exams on Admission: Dg Chest 2 View  Result Date: 07/26/2017 CLINICAL DATA:  Shortness of breath.  Bladder cancer. EXAM: CHEST - 2 VIEW COMPARISON:  08/03/2016 and chest CT dated 07/19/2017. FINDINGS: Borderline enlarged cardiac silhouette. Clear lungs with the exception of minimal linear density at both lung bases. Unremarkable bones. IMPRESSION: 1. Minimal linear atelectasis or scarring at both lung bases. 2. Stable borderline cardiomegaly. Electronically Signed   By: Claudie Revering M.D.   On: 07/26/2017 18:18    EKG: Independently reviewed.  Normal sinus rhythm.  Diffuse ST-T changes.  Assessment/Plan Principal Problem:   COPD exacerbation (HCC) Active Problems:   HTN (hypertension)   Type 2 diabetes mellitus with circulatory disorder (HCC)   Nausea & vomiting    1. Acute respiratory failure with hypoxia likely from COPD exacerbation.  Patient did receive 1 dose of steroid in the ER will continue with Pulmicort and nebulizer.  If patient continues to wheeze may have to continue IV steroids.  I have ordered 1 dose of Lasix given history of diastolic dysfunction.  Last 2D echo done in July 2018 was showing 55 to 53% grade 1 diastolic dysfunction with mild pericardial effusion.  Will check 2D echo. 2. CAD status post stenting denies any chest pain.  Continue aspirin and statins. 3. Nausea vomiting likely from taking medications without eating.  Abdomen appears benign.  Appears to have resolved.  Closely observe. 4. Diabetes mellitus type 2 on metformin.  Will hold metformin while inpatient.  Since patient received steroid likely blood sugars go up.  Follow CBGs  closely. 5. Urothelial carcinoma being followed by Dr. [name].  Oncologist. 6. Hypertension on amlodipine and Cozaar. 7. Anemia likely from chemotherapy.  Follow CBC. 8. Possible UTI was placed on ceftriaxone in the ER.  Follow urine cultures.   DVT prophylaxis: Lovenox. Code Status: Full code. Family Communication: Discussed with patient. Disposition Plan: Home. Consults called: None. Admission status: Observation.   Rise Patience MD [facility] Pager [phone].  If 7PM-7AM, please contact night-coverage [email or web address] Password [email or web address]  07/27/2017, 2:28 AM

## 2017-07-27 NOTE — Progress Notes (Signed)
  Echocardiogram 2D Echocardiogram has been performed.  [name] 07/27/2017, 10:53 AM

## 2017-07-27 NOTE — Progress Notes (Signed)
SATURATION QUALIFICATIONS: (This note is used to comply with regulatory documentation for home oxygen)  Patient Saturations on Room Air at Rest = 95%  Patient Saturations on Room Air while Ambulating = 90%  Patient Saturations on N/A Liters of oxygen while Ambulating = N/A  Please briefly explain why patient needs home oxygen:

## 2017-07-27 NOTE — Progress Notes (Signed)
[facility] PROGRESS NOTE  Donis Pinder WUJ:[id number] DOB: 09-Feb-1945 DOA: 07/26/2017 PCP: [name], MD   Narrative: 72 year old [name] male Known history of CAD status post PCI's cardiac cath 12/27/2014 showed 80% ostial stenosis and was recommended for medical therapy at that time 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 Diastolic heart failure, recent diagnosis of urothelial cancer status post TURBT Admitted with nausea vomiting and hypoxia    A & Plan Hypoxia-resolved-patient ambulated around the unit completely-do not anticipate needing home oxygen Debility-patient states that since his prostate surgery he is "weak"-therapy evaluation is still pending with regards to best placement for him-he uses a cane at baseline and I suspect he will need home health therapy to rehabilitate him to his maximal potential COPD exacerbation-mild we will switch his steroids to prednisone 20 Hyperglycemia secondary to IV steroids-monitor closely-sugars in the 300s-Place on sliding scale--on discharge can resume metformin 500 twice daily History of CAD-stable at this time htn-amlodipine, losartan to be continued Urothelial cancer status post TURBT-continue Toviaz, Flomax Constipation, IBS-Linzess reordered, continue Levsin Reflux-continue pantoprazole/Prilosec    Patient seems to have stabilized since admission however he will need monitoring at least overnight given his multiple comorbidities he is still off status-he can probably discharge as early as tomorrow morning  Lovenox, full code, discussed with son on the phone-likely discharge a.m.   Verlon Au, MD  [facility] Direct contact: [phone] --Via amion app OR  --[email or web address]; password [email or web address]  7PM-7AM contact night coverage as above 07/27/2017, 4:06 PM  LOS: 0 days   Consultants:  none  Procedures:  none  Antimicrobials:  none  Interval history/Subjective: Awake alert no distress No desat on walking  in hall  No cp No fever No chills  Objective:  Vitals:  Vitals:   07/27/17 0936 07/27/17 1313  BP:  121/66  Pulse:  93  Resp:    Temp:  99.1 F (37.3 C)  SpO2: 93% 92%    Exam:  eomi ncat younger than stated age-good bulk, cta b no added sound mild wheeze s1 s 2no m/r/g abd soft nt nd no rebound no guard No le edema Neuro intact Psych coherent and competent  I have personally reviewed the following:   Labs:  Stable and reviewed-hemoglobin 12.6  Imaging studies:  none  Medical tests:  none   Test discussed with performing physician:  no  Decision to obtain old records:  Yes   Review and summation of old records:  no  Scheduled Meds: . amLODipine  10 mg Oral Daily  . aspirin EC  81 mg Oral Daily  . enoxaparin (LOVENOX) injection  40 mg Subcutaneous Q24H  . fesoterodine  8 mg Oral Daily  . folic acid  1 mg Oral Daily  . insulin aspart  0-9 Units Subcutaneous TID WC  . ipratropium-albuterol  3 mL Nebulization TID  . linaclotide  72 mcg Oral q morning - 10a  . losartan  50 mg Oral Daily  . mometasone-formoterol  2 puff Inhalation BID  . pantoprazole  40 mg Oral Daily  . potassium chloride SA  20 mEq Oral Daily  . rosuvastatin  20 mg Oral Daily  . tamsulosin  0.4 mg Oral QPM   Continuous Infusions: . cefTRIAXone (ROCEPHIN)  IV      Principal Problem:   COPD exacerbation (HCC) Active Problems:   HTN (hypertension)   Type 2 diabetes mellitus with circulatory disorder (HCC)   Nausea & vomiting   LOS: 0 days

## 2017-07-28 ENCOUNTER — Observation Stay (HOSPITAL_COMMUNITY): Payer: Medicare HMO

## 2017-07-28 DIAGNOSIS — J189 Pneumonia, unspecified organism: Secondary | ICD-10-CM

## 2017-07-28 DIAGNOSIS — J441 Chronic obstructive pulmonary disease with (acute) exacerbation: Secondary | ICD-10-CM | POA: Diagnosis not present

## 2017-07-28 HISTORY — DX: Pneumonia, unspecified organism: J18.9

## 2017-07-28 LAB — GLUCOSE, CAPILLARY
GLUCOSE-CAPILLARY: 228 mg/dL — AB (ref 70–99)
Glucose-Capillary: 296 mg/dL — ABNORMAL HIGH (ref 70–99)
Glucose-Capillary: 187 mg/dL — ABNORMAL HIGH (ref 70–99)

## 2017-07-28 LAB — URINE CULTURE: Culture: NO GROWTH

## 2017-07-28 NOTE — Discharge Summary (Signed)
Physician Discharge Summary  [name] DQQ:[id number] DOB: 17-May-1945 DOA: 07/26/2017  PCP: [name], MD  Admit date: 07/26/2017 Discharge date: 07/28/2017  Time spent: 35 minutes  Recommendations for Outpatient Follow-up:  1. Needs follow-up with radiation oncology if pain in abdomen does not improve I will cc Dr. Lisbeth Renshaw 2. Recommend outpatient follow with PCP  Discharge Diagnoses:  Principal Problem:   COPD exacerbation ([place]) Active Problems:   HTN (hypertension)   Type 2 diabetes mellitus with circulatory disorder (HCC)   Nausea & vomiting   Discharge Condition: Improved  Diet recommendation: Heart healthy  Filed Weights   07/27/17 0650 07/28/17 0419  Weight: 65.8 kg (145 lb 1 oz) 65.2 kg (143 lb 11.8 oz)    History of present illness:  72 year old [name] male Known history of CAD status post PCI's cardiac cath 12/27/2014 showed 80% ostial stenosis and was recommended for medical therapy at that time 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 Diastolic heart failure, recent diagnosis of urothelial cancer status post TURBT Admitted with nausea vomiting and hypoxia  Hospital Course:   Hypoxia-resolved-patient ambulated around the unit completely without oxygen--Eval PT didn't require any outpatient services  Debility-patient states that since his prostate surgery he is "weak"-if he continues to have dysuria he may be a candidate for further management by either his urologist oncologist or radiation oncologist who I will copy on this note  COPD exacerbation-discontinued all steroids on discharge  Hyperglycemia secondary to IV steroids-stable at this time  History of CAD-stable at this time  htn-amlodipine, losartan to be continued  Urothelial cancer status post TURBT-continue Toviaz, Flomax  Constipation, IBS-Linzess reordered, continue Levsin  Reflux-continue pantoprazole/Prilosec    Patient seems to have stabilized since admission however he will  need monitoring at least overnight given his multiple comorbidities he is still off status-he can probably discharge as early as tomorrow morning   Discharge Exam: Vitals:   07/28/17 0807 07/28/17 0809  BP:    Pulse:    Resp:    Temp:    SpO2: 95% 95%    General: Awake alert pleasant no distress Cardiovascular: S1-S2 no murmur rub or gallop Respiratory: Clinically clear no added sound Abdomen soft nontender no rebound No lower extremity edema  Discharge Instructions   Discharge Instructions    Diet - low sodium heart healthy   Complete by:  As directed    Discharge instructions   Complete by:  As directed    See oncologict in a couple of weeks No needs for any home assitance   Increase activity slowly   Complete by:  As directed      Allergies as of 07/28/2017   No Known Allergies     Medication List    STOP taking these medications   cephALEXin 500 MG capsule Commonly known as:  KEFLEX   meloxicam 15 MG tablet Commonly known as:  MOBIC   potassium chloride SA 20 MEQ tablet Commonly known as:  K-DUR,KLOR-CON     TAKE these medications   acetaminophen 500 MG tablet Commonly known as:  TYLENOL Take 500 mg by mouth every 8 (eight) hours as needed for mild pain, moderate pain, fever or headache.   albuterol (2.5 MG/3ML) 0.083% nebulizer solution Commonly known as:  PROVENTIL Take 2.5 mg by nebulization every 6 (six) hours as needed for wheezing or shortness of breath.   albuterol 108 (90 Base) MCG/ACT inhaler Commonly known as:  PROVENTIL HFA Inhale 2 puffs into the lungs 2 (two) times daily.  amLODipine 10 MG tablet Commonly known as:  NORVASC Take 10 mg by mouth daily.   arformoterol 15 MCG/2ML Nebu Commonly known as:  BROVANA Take 15 mcg by nebulization 2 (two) times daily.   aspirin EC 81 MG tablet Take 81 mg by mouth daily.   diclofenac sodium 1 % Gel Commonly known as:  VOLTAREN Apply 4 g topically 4 (four) times daily as needed (knee  pain).   folic acid 1 MG tablet Commonly known as:  FOLVITE Take 1 mg by mouth daily.   hyoscyamine 0.125 MG tablet Commonly known as:  LEVSIN, ANASPAZ Take 1 tablet (0.125 mg total) by mouth every 4 (four) hours as needed.   ibuprofen 200 MG tablet Commonly known as:  ADVIL,MOTRIN Take 200 mg by mouth every 8 (eight) hours as needed for fever, headache, mild pain, moderate pain or cramping.   LINZESS 72 MCG capsule Generic drug:  linaclotide Take 72 mcg by mouth every morning.   losartan 50 MG tablet Commonly known as:  COZAAR Take 50 mg by mouth daily.   metFORMIN 500 MG tablet Commonly known as:  GLUCOPHAGE Take 500 mg by mouth 2 (two) times daily with a meal.   MULTIVITAMIN ADULTS Tabs Take 1 tablet by mouth daily.   nitroGLYCERIN 0.4 MG SL tablet Commonly known as:  NITROSTAT Place 1 tablet (0.4 mg total) under the tongue every 5 (five) minutes as needed for chest pain.   omeprazole 20 MG capsule Commonly known as:  PRILOSEC Take 1 capsule (20 mg total) by mouth 2 (two) times daily before a meal.   phenazopyridine 200 MG tablet Commonly known as:  PYRIDIUM Take 1 tablet (200 mg total) by mouth 3 (three) times daily with meals.   prochlorperazine 10 MG tablet Commonly known as:  COMPAZINE TAKE 1 TABLET (10 MG TOTAL) BY MOUTH EVERY 6 (SIX) HOURS AS NEEDED FOR NAUSEA OR VOMITING.   rosuvastatin 20 MG tablet Commonly known as:  CRESTOR TAKE 20MG  1 TABLET DAILY FOR ELEVATED CHOLESTEROL   SYMBICORT 160-4.5 MCG/ACT inhaler Generic drug:  budesonide-formoterol TAKE 2 PUFFS BY MOUTH TWICE A DAY   tamsulosin 0.4 MG Caps capsule Commonly known as:  FLOMAX Take 2 capsules (0.8 mg total) by mouth every evening. What changed:  how much to take   tiotropium 18 MCG inhalation capsule Commonly known as:  SPIRIVA HANDIHALER Place 1 capsule (18 mcg total) into inhaler and inhale daily.   TOVIAZ 8 MG Tb24 tablet Generic drug:  fesoterodine Take 8 mg by mouth daily.    traMADol 50 MG tablet Commonly known as:  ULTRAM Take 50 mg by mouth every 8 (eight) hours as needed for moderate pain.   Vitamin D (Ergocalciferol) 50000 units Caps capsule Commonly known as:  DRISDOL 1 PO ONCE A WEEK      No Known Allergies    The results of significant diagnostics from this hospitalization (including imaging, microbiology, ancillary and laboratory) are listed below for reference.    Significant Diagnostic Studies: Dg Chest 2 View  Result Date: 07/26/2017 CLINICAL DATA:  Shortness of breath.  Bladder cancer. EXAM: CHEST - 2 VIEW COMPARISON:  08/03/2016 and chest CT dated 07/19/2017. FINDINGS: Borderline enlarged cardiac silhouette. Clear lungs with the exception of minimal linear density at both lung bases. Unremarkable bones. IMPRESSION: 1. Minimal linear atelectasis or scarring at both lung bases. 2. Stable borderline cardiomegaly. Electronically Signed   By: Claudie Revering M.D.   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 Radiation therapy completed 05/28/2017. Restaging. EXAM: CT CHEST, ABDOMEN, AND PELVIS WITH CONTRAST TECHNIQUE: Multidetector CT imaging of the chest, abdomen and pelvis was performed following the standard protocol during bolus administration of intravenous contrast. CONTRAST:  193mL ISOVUE-300 IOPAMIDOL (ISOVUE-300) INJECTION 61% COMPARISON:  06/17/2017 CT abdomen/pelvis.  08/05/2016 chest CT. FINDINGS: CT CHEST FINDINGS Cardiovascular: Normal heart size. No significant pericardial effusion/thickening. Left anterior descending and right coronary atherosclerosis. Atherosclerotic thoracic aorta with stable ectatic 4.1 cm thoracic aorta. Stable top-normal caliber main pulmonary artery (3.2 cm diameter). No central pulmonary emboli. Mediastinum/Nodes: Stable 1 cm coarsely calcified thyroid isthmus nodule. Unremarkable esophagus. No  pathologically enlarged axillary, mediastinal or hilar lymph nodes. Lungs/Pleura: No pneumothorax. No pleural effusion. Mild-to-moderate centrilobular and paraseptal emphysema with saber sheath trachea and diffuse bronchial wall thickening. There are patchy tree-in-bud opacities in peripheral right middle and throughout the basilar right lower lobe. Parenchymal band in basilar right lower lobe is compatible with postinfectious/postinflammatory scarring. Sub solid 8 mm posterior left upper lobe pulmonary nodule (series 6/image 39), new since 08/05/2016 chest CT. Subsolid 6 mm posterior left upper lobe nodule (series 6/image 82) is stable since 08/05/2016 chest CT, probably benign. No acute consolidative airspace disease, lung masses or additional significant pulmonary nodules. Musculoskeletal: No aggressive appearing focal osseous lesions. Minimal thoracic spondylosis. CT ABDOMEN PELVIS FINDINGS Hepatobiliary: Normal liver with no liver mass. Normal gallbladder with no radiopaque cholelithiasis. No biliary ductal dilatation. Pancreas: Hypodense 0.8 cm pancreatic body lesion (series 2/image 58) is stable since at least 08/05/2016 CT. No additional pancreatic lesions. No pancreatic duct dilation. Spleen: Normal size. No mass. Adrenals/Urinary Tract: Normal adrenals. No hydronephrosis. Subcentimeter hypodense renal cortical lesions in both kidneys are too small to characterize and not appreciably changed, probably benign. No new renal lesions. Minimally distended bladder with mildly irregular diffuse bladder wall thickening, slightly asymmetrically prominent on the left, similar to the prior scan although more uniform in thickness on today's scan. Stomach/Bowel: Normal non-distended stomach. Normal caliber small bowel with no small bowel wall thickening. Normal appendix. Normal large bowel with no diverticulosis, large bowel wall thickening or pericolonic fat stranding. Vascular/Lymphatic: Atherosclerotic nonaneurysmal  abdominal aorta. Patent portal, splenic, hepatic and renal veins. No pathologically enlarged lymph nodes in the abdomen or pelvis. Reproductive: Stable top-normal size prostate. Other: No pneumoperitoneum, ascites or focal fluid collection. Musculoskeletal: No aggressive appearing focal osseous lesions. Mild lumbar spondylosis. IMPRESSION: 1. Nonspecific mild diffuse bladder wall thickening, slightly asymmetrically prominent on the left, similar to the prior scan although more uniform in thickness on today's scan, probably reflecting post treatment changes with residual tumor not excluded. 2. No evidence of metastatic disease in the abdomen or pelvis. 3. New subsolid 8 mm left upper lobe pulmonary nodule, indeterminate, favor inflammatory. Recommend attention on follow-up chest CT in 3 months. 4. Patchy tree-in-bud opacities in the right middle and lower lobes compatible with nonspecific infectious or inflammatory bronchiolitis, such as due to aspiration. Postinfectious/postinflammatory scarring at the right lung base. 5. Stable ectatic 4.1 cm ascending thoracic aorta. Recommend annual imaging followup by CTA or MRA. This recommendation follows 2010 ACCF/AHA/AATS/ACR/ASA/SCA/SCAI/SIR/STS/SVM Guidelines for the Diagnosis and Management of Patients with Thoracic Aortic Disease. Circulation. 2010; 121: U132-G401. 6. Indeterminate hypodense 0.8 cm pancreatic body lesion, stable since at least 08/05/2016 CT, suggestive of a nonaggressive cystic pancreatic lesion, although incompletely characterized on this scan. Suggest attention on follow-up MRI abdomen without and with IV contrast in 6-12 months. 7.  Aortic Atherosclerosis (ICD10-I70.0) and Emphysema (ICD10-J43.9). Electronically Signed   By: Ilona Sorrel M.D.   On: 07/19/2017 12:32   Ct Abdomen Pelvis W Contrast  Result Date: 07/19/2017 CLINICAL DATA:  Multifocal high-grade bladder cancer status post chemoradiation therapy. Radiation therapy completed 05/28/2017.  Restaging. EXAM: CT CHEST, ABDOMEN, AND PELVIS WITH CONTRAST TECHNIQUE: Multidetector CT imaging of the chest, abdomen and pelvis was performed following the standard protocol during bolus administration of intravenous contrast. CONTRAST:  127mL ISOVUE-300 IOPAMIDOL (ISOVUE-300) INJECTION 61% COMPARISON:  06/17/2017 CT abdomen/pelvis.  08/05/2016 chest CT. FINDINGS: CT CHEST FINDINGS Cardiovascular: Normal heart size. No significant pericardial effusion/thickening. Left anterior descending and right coronary atherosclerosis. Atherosclerotic thoracic aorta with stable ectatic 4.1 cm thoracic aorta. Stable top-normal caliber main pulmonary artery (3.2 cm diameter). No central pulmonary emboli. Mediastinum/Nodes: Stable 1 cm coarsely calcified thyroid isthmus nodule. Unremarkable esophagus. No pathologically enlarged axillary, mediastinal or hilar lymph nodes. Lungs/Pleura: No pneumothorax. No pleural effusion. Mild-to-moderate centrilobular and paraseptal emphysema with saber sheath trachea and diffuse bronchial wall thickening. There are patchy tree-in-bud opacities in peripheral right middle and throughout the basilar right lower lobe. Parenchymal band in basilar right lower lobe is compatible with postinfectious/postinflammatory scarring. Sub solid 8 mm posterior left upper lobe pulmonary nodule (series 6/image 39), new since 08/05/2016 chest CT. Subsolid 6 mm posterior left upper lobe nodule (series 6/image 82) is stable since 08/05/2016 chest CT, probably benign. No acute consolidative airspace disease, lung masses or additional significant pulmonary nodules. Musculoskeletal: No aggressive appearing focal osseous lesions. Minimal thoracic spondylosis. CT ABDOMEN PELVIS FINDINGS Hepatobiliary: Normal liver with no liver mass. Normal gallbladder with no radiopaque cholelithiasis. No biliary ductal dilatation. Pancreas: Hypodense 0.8 cm pancreatic body lesion (series 2/image 58) is stable since at least 08/05/2016  CT. No additional pancreatic lesions. No pancreatic duct dilation. Spleen: Normal size. No mass. Adrenals/Urinary Tract: Normal adrenals. No hydronephrosis. Subcentimeter hypodense renal cortical lesions in both kidneys are too small to characterize and not appreciably changed, probably benign. No new renal lesions. Minimally distended bladder with mildly irregular diffuse bladder wall thickening, slightly asymmetrically prominent on the left, similar to the prior scan although more uniform in thickness on today's scan. Stomach/Bowel: Normal non-distended stomach. Normal caliber small bowel with no small bowel wall thickening. Normal appendix. Normal large bowel with no diverticulosis, large bowel wall thickening or pericolonic fat stranding. Vascular/Lymphatic: Atherosclerotic nonaneurysmal abdominal aorta. Patent portal, splenic, hepatic and renal veins. No pathologically enlarged lymph nodes in the abdomen or pelvis. Reproductive: Stable top-normal size prostate. Other: No pneumoperitoneum, ascites or focal fluid collection. Musculoskeletal: No aggressive appearing focal osseous lesions. Mild lumbar spondylosis. IMPRESSION: 1. Nonspecific mild diffuse bladder wall thickening, slightly asymmetrically prominent on the left, similar to the prior scan although more uniform in thickness on today's scan, probably reflecting post treatment changes with residual tumor not excluded. 2. No evidence of metastatic disease in the abdomen or pelvis. 3. New subsolid 8 mm left upper lobe pulmonary nodule, indeterminate, favor inflammatory. Recommend attention on follow-up chest CT in 3 months. 4. Patchy tree-in-bud opacities in the right middle and lower lobes compatible with nonspecific infectious or inflammatory bronchiolitis, such as due to aspiration. Postinfectious/postinflammatory scarring at the right lung base. 5. Stable ectatic 4.1 cm ascending thoracic aorta. Recommend annual imaging followup by CTA or MRA. This  recommendation follows 2010 ACCF/AHA/AATS/ACR/ASA/SCA/SCAI/SIR/STS/SVM Guidelines for the Diagnosis and Management of Patients with Thoracic Aortic Disease. Circulation. 2010; 121: Z767-H419. 6. Indeterminate hypodense 0.8 cm pancreatic body lesion, stable since at least 08/05/2016  CT, suggestive of a nonaggressive cystic pancreatic lesion, although incompletely characterized on this scan. Suggest attention on follow-up MRI abdomen without and with IV contrast in 6-12 months. 7. Aortic Atherosclerosis (ICD10-I70.0) and Emphysema (ICD10-J43.9). Electronically Signed   By: Ilona Sorrel M.D.   On: 07/19/2017 12:32    Microbiology: Recent Results (from the past 240 hour(s))  Urine culture     Status: None   Collection Time: 07/26/17  6:08 PM  Result Value Ref Range Status   Specimen Description   Final    URINE, RANDOM Performed at [facility] [address] [place], [place] [address]    Special Requests   Final    NONE Performed at [facility], [place] [address] [place], [place] [address]    Culture   Final    NO GROWTH Performed at [facility], [place] [address] [place], [place] [address]    Report Status 07/28/2017 FINAL  Final     Labs: Basic Metabolic Panel: Recent Labs  Lab 07/26/17 1700 07/27/17 0413  NA 140 139  K 3.1* 3.8  CL 107 106  CO2 25 25  GLUCOSE 142* 351*  BUN 10 10  CREATININE 0.66 0.78  CALCIUM 8.4* 8.6*   Liver Function Tests: Recent Labs  Lab 07/26/17 1700 07/27/17 0413  AST 26 27  ALT 24 25  ALKPHOS 47 50  BILITOT 0.6 0.6  PROT 6.6 6.6  ALBUMIN 3.6 3.6   No results for input(s): LIPASE, AMYLASE in the last 168 hours. No results for input(s): AMMONIA in the last 168 hours. CBC: Recent Labs  Lab 07/26/17 1700 07/27/17 0413  WBC 9.1 10.1  NEUTROABS 7.1  --   HGB 12.2* 12.6*  HCT 37.3* 39.0  MCV 92.8 94.0  PLT 304 312   Cardiac Enzymes: Recent Labs  Lab 07/27/17 0413 07/27/17 0858  07/27/17 1527  TROPONINI <0.03 <0.03 <0.03   BNP: BNP (last 3 results) Recent Labs    08/05/16 1638  BNP 371.5*    ProBNP (last 3 results) No results for input(s): PROBNP in the last 8760 hours.  CBG: Recent Labs  Lab 07/27/17 1203 07/27/17 1738 07/27/17 2043 07/27/17 2229 07/28/17 0726  GLUCAP 312* 241* 290* 327* 187*       Signed:  Nita Sells MD   [facility] 07/28/2017, 12:06 PM

## 2017-07-28 NOTE — Evaluation (Signed)
Physical Therapy Evaluation Patient Details Name: [name] MRN: [id number] DOB: 1945/05/22 Today's Date: 07/28/2017   History of Present Illness  72 y.o. male with history of CAD status post stenting, severe COPD, hypertension, diastolic dysfunction, urothelial cancer status post chemoradiation and TURBT had recent CT chest and abdomen done 2 days ago by patient's oncologist which was largely unremarkable presents to the ER with sudden onset of nausea vomiting which happened this morning after patient took his pain medications without eating.  Following which patient has been shortness of breath.    Clinical Impression  Pt ambulated 400' with his straight cane, no loss of balance, SaO2 93% on room air, 2/4 dyspnea.  From PT standpoint, he is ready to DC home with no follow up PT indicated.     Follow Up Recommendations No PT follow up    Equipment Recommendations  None recommended by PT    Recommendations for Other Services       Precautions / Restrictions Precautions Precautions: None Precaution Comments: pt denies falls in past 1 year Restrictions Weight Bearing Restrictions: No      Mobility  Bed Mobility Overal bed mobility: Independent                Transfers Overall transfer level: Independent                  Ambulation/Gait Ambulation/Gait assistance: Modified independent (Device/Increase time) Gait Distance (Feet): 400 Feet Assistive device: Straight cane Gait Pattern/deviations: WFL(Within Functional Limits) Gait velocity: WNL   General Gait Details: steady, no loss of balance, HR 111 walking, SaO2 94% on room air walking, 2/4 dyspnea  Stairs            Wheelchair Mobility    Modified Rankin (Stroke Patients Only)       Balance Overall balance assessment: Modified Independent                                           Pertinent Vitals/Pain Pain Assessment: No/denies pain    Home Living Family/patient  expects to be discharged to:: Private residence Living Arrangements: Spouse/significant other;Children Available Help at Discharge: Family;Available PRN/intermittently Type of Home: House Home Access: Level entry     Home Layout: One level Home Equipment: Walker - 2 wheels;Cane - single point      Prior Function Level of Independence: Independent with assistive device(s)         Comments: uses SPC for gait, independent ADLs     Hand Dominance        Extremity/Trunk Assessment   Upper Extremity Assessment Upper Extremity Assessment: Overall WFL for tasks assessed    Lower Extremity Assessment Lower Extremity Assessment: Overall WFL for tasks assessed    Cervical / Trunk Assessment Cervical / Trunk Assessment: Normal  Communication   Communication: Prefers language other than English(can speak/understand English)  Cognition Arousal/Alertness: Awake/alert Behavior During Therapy: WFL for tasks assessed/performed Overall Cognitive Status: Within Functional Limits for tasks assessed                                        General Comments      Exercises     Assessment/Plan    PT Assessment Patent does not need any further PT services  PT Problem List  PT Treatment Interventions      PT Goals (Current goals can be found in the Care Plan section)  Acute Rehab PT Goals PT Goal Formulation: All assessment and education complete, DC therapy    Frequency     Barriers to discharge        Co-evaluation               AM-PAC PT "6 Clicks" Daily Activity  Outcome Measure Difficulty turning over in bed (including adjusting bedclothes, sheets and blankets)?: None Difficulty moving from lying on back to sitting on the side of the bed? : None Difficulty sitting down on and standing up from a chair with arms (e.g., wheelchair, bedside commode, etc,.)?: None Help needed moving to and from a bed to chair (including a wheelchair)?:  None Help needed walking in hospital room?: None Help needed climbing 3-5 steps with a railing? : None 6 Click Score: 24    End of Session Equipment Utilized During Treatment: Gait belt Activity Tolerance: Patient tolerated treatment well Patient left: in chair;with call bell/phone within reach Nurse Communication: Mobility status      Time: 0677-0340 PT Time Calculation (min) (ACUTE ONLY): 16 min   Charges:   PT Evaluation $PT Eval Low Complexity: 1 Low     PT G Codes:          Philomena Doheny 07/28/2017, 9:48 AM [phone]

## 2017-08-06 DIAGNOSIS — C678 Malignant neoplasm of overlapping sites of bladder: Secondary | ICD-10-CM | POA: Diagnosis not present

## 2017-08-21 DIAGNOSIS — E1165 Type 2 diabetes mellitus with hyperglycemia: Secondary | ICD-10-CM | POA: Diagnosis not present

## 2017-08-21 DIAGNOSIS — M179 Osteoarthritis of knee, unspecified: Secondary | ICD-10-CM | POA: Diagnosis not present

## 2017-08-21 DIAGNOSIS — I2510 Atherosclerotic heart disease of native coronary artery without angina pectoris: Secondary | ICD-10-CM | POA: Diagnosis not present

## 2017-08-21 DIAGNOSIS — I10 Essential (primary) hypertension: Secondary | ICD-10-CM | POA: Diagnosis not present

## 2017-08-21 DIAGNOSIS — C679 Malignant neoplasm of bladder, unspecified: Secondary | ICD-10-CM | POA: Diagnosis not present

## 2017-08-21 DIAGNOSIS — J449 Chronic obstructive pulmonary disease, unspecified: Secondary | ICD-10-CM | POA: Diagnosis not present

## 2017-10-02 DIAGNOSIS — J441 Chronic obstructive pulmonary disease with (acute) exacerbation: Secondary | ICD-10-CM | POA: Diagnosis not present

## 2017-10-02 DIAGNOSIS — E1165 Type 2 diabetes mellitus with hyperglycemia: Secondary | ICD-10-CM | POA: Diagnosis not present

## 2017-10-02 DIAGNOSIS — Z23 Encounter for immunization: Secondary | ICD-10-CM | POA: Diagnosis not present

## 2017-10-02 DIAGNOSIS — C679 Malignant neoplasm of bladder, unspecified: Secondary | ICD-10-CM | POA: Diagnosis not present

## 2017-10-02 DIAGNOSIS — I10 Essential (primary) hypertension: Secondary | ICD-10-CM | POA: Diagnosis not present

## 2017-10-11 DIAGNOSIS — I10 Essential (primary) hypertension: Secondary | ICD-10-CM | POA: Diagnosis not present

## 2017-10-11 DIAGNOSIS — J449 Chronic obstructive pulmonary disease, unspecified: Secondary | ICD-10-CM | POA: Diagnosis not present

## 2017-10-11 DIAGNOSIS — E1165 Type 2 diabetes mellitus with hyperglycemia: Secondary | ICD-10-CM | POA: Diagnosis not present

## 2017-10-11 DIAGNOSIS — N401 Enlarged prostate with lower urinary tract symptoms: Secondary | ICD-10-CM | POA: Diagnosis not present

## 2017-10-12 ENCOUNTER — Other Ambulatory Visit: Payer: Self-pay | Admitting: Cardiology

## 2017-10-29 DIAGNOSIS — C679 Malignant neoplasm of bladder, unspecified: Secondary | ICD-10-CM | POA: Diagnosis not present

## 2017-10-29 DIAGNOSIS — N5201 Erectile dysfunction due to arterial insufficiency: Secondary | ICD-10-CM | POA: Diagnosis not present

## 2017-10-29 DIAGNOSIS — R350 Frequency of micturition: Secondary | ICD-10-CM | POA: Diagnosis not present

## 2017-10-29 DIAGNOSIS — C670 Malignant neoplasm of trigone of bladder: Secondary | ICD-10-CM | POA: Diagnosis not present

## 2017-10-30 ENCOUNTER — Inpatient Hospital Stay: Payer: Medicare HMO

## 2017-10-30 ENCOUNTER — Inpatient Hospital Stay: Payer: Medicare HMO | Attending: Oncology | Admitting: Oncology

## 2017-10-30 ENCOUNTER — Telehealth: Payer: Self-pay | Admitting: Oncology

## 2017-10-30 VITALS — BP 152/80 | HR 79 | Temp 98.4°F | Resp 18 | Ht 60.0 in | Wt 146.4 lb

## 2017-10-30 DIAGNOSIS — Z8551 Personal history of malignant neoplasm of bladder: Secondary | ICD-10-CM | POA: Insufficient documentation

## 2017-10-30 DIAGNOSIS — C672 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: Secondary | ICD-10-CM

## 2017-10-30 LAB — CMP (CANCER CENTER ONLY)
ALT: 24 U/L (ref 0–44)
ANION GAP: 11 (ref 5–15)
AST: 18 U/L (ref 15–41)
Albumin: 3.7 g/dL (ref 3.5–5.0)
Alkaline Phosphatase: 69 U/L (ref 38–126)
BUN: 13 mg/dL (ref 8–23)
CHLORIDE: 106 mmol/L (ref 98–111)
CO2: 25 mmol/L (ref 22–32)
CREATININE: 0.87 mg/dL (ref 0.61–1.24)
Calcium: 9.1 mg/dL (ref 8.9–10.3)
Glucose, Bld: 97 mg/dL (ref 70–99)
POTASSIUM: 3.6 mmol/L (ref 3.5–5.1)
Sodium: 142 mmol/L (ref 135–145)
Total Bilirubin: 0.4 mg/dL (ref 0.3–1.2)
Total Protein: 7.1 g/dL (ref 6.5–8.1)

## 2017-10-30 LAB — CBC WITH DIFFERENTIAL (CANCER CENTER ONLY)
Abs Immature Granulocytes: 0.03 10*3/uL (ref 0.00–0.07)
BASOS ABS: 0 10*3/uL (ref 0.0–0.1)
Basophils Relative: 0 %
EOS PCT: 3 %
Eosinophils Absolute: 0.2 10*3/uL (ref 0.0–0.5)
HEMATOCRIT: 43.1 % (ref 39.0–52.0)
HEMOGLOBIN: 13.9 g/dL (ref 13.0–17.0)
IMMATURE GRANULOCYTES: 0 %
LYMPHS ABS: 1.2 10*3/uL (ref 0.7–4.0)
LYMPHS PCT: 17 %
MCH: 26.8 pg (ref 26.0–34.0)
MCHC: 32.3 g/dL (ref 30.0–36.0)
MCV: 83.2 fL (ref 80.0–100.0)
Monocytes Absolute: 0.8 10*3/uL (ref 0.1–1.0)
Monocytes Relative: 11 %
NEUTROS ABS: 4.8 10*3/uL (ref 1.7–7.7)
NEUTROS PCT: 69 %
NRBC: 0 % (ref 0.0–0.2)
Platelet Count: 255 10*3/uL (ref 150–400)
RBC: 5.18 MIL/uL (ref 4.22–5.81)
RDW: 15.1 % (ref 11.5–15.5)
WBC: 7.1 10*3/uL (ref 4.0–10.5)

## 2017-10-30 NOTE — Telephone Encounter (Signed)
Appts scheduled avs/calendar printed per 10/23 los °

## 2017-10-30 NOTE — Progress Notes (Signed)
Hematology and Oncology Follow Up Visit  [name] [id number] 1945-03-09 72 y.o. 10/30/2017 9:39 AM [name], MDAvbuere, [name] Grief, MD   Principle Diagnosis: 72 year old man with bladder cancer diagnosed in March 2018. 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    Prior Therapy: He is status post TURBT done on March 15, 2017. He is status post definitive radiation therapy with weekly carboplatin completed on May 23, 2017.  Current therapy: Active surveillance.  Interim History: [name] returns today for repeat evaluation with his daughter and an interpreter.  Since her last visit, he reports no major changes or complaints.  He continues to ambulate without any major difficulties utilizing a cane.  He denies any falls or syncope.  His appetite and performance status is unchanged.  He does report some pelvic discomfort with urination but no hematuria or dysuria.  He continues to report no bone pain or pathological fractures.  He denies any constitutional symptoms.  He does not report any headaches, blurry vision, or seizures.  He denies any confusion or lethargy.  Does not report any fevers, chills or sweats.  Does not report any cough, wheezing or hemoptysis.  Does not report any chest pain, palpitation, orthopnea or leg edema.  Does not report any nausea, vomiting or abdominal pain. He denies any changes in his bowel habits. Does not report any paralysis or myalgias.   Does not report frequency, urgency or hematuria.  Does not report any skin rashes or lesions.  Does not report any eating or clotting tendency.  Does not report any anxiety or depression.  Remaining review of systems is negative.    Medications: I have reviewed the patient's current medications.  Current Outpatient Medications  Medication Sig Dispense Refill  . acetaminophen (TYLENOL) 500 MG tablet Take 500 mg by mouth every 8 (eight) hours as needed for mild pain, moderate pain, fever or headache.    . albuterol  (PROVENTIL HFA) 108 (90 BASE) MCG/ACT inhaler Inhale 2 puffs into the lungs 2 (two) times daily. 1 Inhaler 1  . albuterol (PROVENTIL) (2.5 MG/3ML) 0.083% nebulizer solution Take 2.5 mg by nebulization every 6 (six) hours as needed for wheezing or shortness of breath.    [name] amLODipine (NORVASC) 10 MG tablet Take 10 mg by mouth daily.     [name] arformoterol (BROVANA) 15 MCG/2ML NEBU Take 15 mcg by nebulization 2 (two) times daily.    [name] aspirin EC 81 MG tablet Take 81 mg by mouth daily.    . diclofenac sodium (VOLTAREN) 1 % GEL Apply 4 g topically 4 (four) times daily as needed (knee pain).    . folic acid (FOLVITE) 1 MG tablet Take 1 mg by mouth daily.  2  . hyoscyamine (LEVSIN, ANASPAZ) 0.125 MG tablet Take 1 tablet (0.125 mg total) by mouth every 4 (four) hours as needed. 60 tablet 1  . ibuprofen (ADVIL,MOTRIN) 200 MG tablet Take 200 mg by mouth every 8 (eight) hours as needed for fever, headache, mild pain, moderate pain or cramping.    [name] LINZESS 72 MCG capsule Take 72 mcg by mouth every morning.  2  . losartan (COZAAR) 50 MG tablet Take 50 mg by mouth daily.  2  . metFORMIN (GLUCOPHAGE) 500 MG tablet Take 500 mg by mouth 2 (two) times daily with a meal.     . Multiple Vitamins-Minerals (MULTIVITAMIN ADULTS) TABS Take 1 tablet by mouth daily.    . nitroGLYCERIN (NITROSTAT) 0.4 MG SL tablet Place 1 tablet (0.4 mg total)  under the tongue every 5 (five) minutes as needed for chest pain. 25 tablet 3  . omeprazole (PRILOSEC) 20 MG capsule Take 1 capsule (20 mg total) by mouth 2 (two) times daily before a meal. NEED OV. 90 capsule 0  . phenazopyridine (PYRIDIUM) 200 MG tablet Take 1 tablet (200 mg total) by mouth 3 (three) times daily with meals. 90 tablet 1  . prochlorperazine (COMPAZINE) 10 MG tablet TAKE 1 TABLET (10 MG TOTAL) BY MOUTH EVERY 6 (SIX) HOURS AS NEEDED FOR NAUSEA OR VOMITING. 30 tablet 0  . rosuvastatin (CRESTOR) 20 MG tablet TAKE 20MG  1 TABLET DAILY FOR ELEVATED CHOLESTEROL  3  . SYMBICORT  160-4.5 MCG/ACT inhaler TAKE 2 PUFFS BY MOUTH TWICE A DAY  5  . tamsulosin (FLOMAX) 0.4 MG CAPS capsule Take 2 capsules (0.8 mg total) by mouth every evening. (Patient taking differently: Take 0.4 mg by mouth every evening. ) 30 capsule 2  . tiotropium (SPIRIVA HANDIHALER) 18 MCG inhalation capsule Place 1 capsule (18 mcg total) into inhaler and inhale daily. 30 capsule 1  . TOVIAZ 8 MG TB24 tablet Take 8 mg by mouth daily.     . traMADol (ULTRAM) 50 MG tablet Take 50 mg by mouth every 8 (eight) hours as needed for moderate pain.    . Vitamin D, Ergocalciferol, (DRISDOL) 50000 units CAPS capsule 1 PO ONCE A WEEK  5   No current facility-administered medications for this visit.      Allergies: No Known Allergies  Past Medical History, Surgical history, Social history, and Family History were reviewed and updated.    Physical Exam:  Blood pressure (!) 152/80, pulse 79, temperature 98.4 F (36.9 C), temperature source Oral, resp. rate 18, height 5' (1.524 m), weight 146 lb 6.4 oz (66.4 kg), SpO2 99 %.     ECOG: 1   General appearance: Comfortable appearing without any discomfort Head: Normocephalic without any trauma Oropharynx: Mucous membranes are moist and pink without any thrush or ulcers. Eyes: Pupils are equal and round reactive to light. Lymph nodes: No cervical, supraclavicular, inguinal or axillary lymphadenopathy.   Heart:regular rate and rhythm.  S1 and S2 without leg edema. Lung: Clear without any rhonchi or wheezes.  No dullness to percussion. Abdomin: Soft, nontender, nondistended with good bowel sounds.  No hepatosplenomegaly. Musculoskeletal: No joint deformity or effusion.  Full range of motion noted. Neurological: No deficits noted on motor, sensory and deep tendon reflex exam. Skin: No petechial rash or dryness.  Appeared moist.  .     Lab Results: Lab Results  Component Value Date   WBC 7.1 10/30/2017   HGB 13.9 10/30/2017   HCT 43.1 10/30/2017   MCV  83.2 10/30/2017   PLT 255 10/30/2017     Chemistry      Component Value Date/Time   NA 139 07/27/2017 0413   K 3.8 07/27/2017 0413   CL 106 07/27/2017 0413   CO2 25 07/27/2017 0413   BUN 10 07/27/2017 0413   CREATININE 0.78 07/27/2017 0413   CREATININE 1.08 07/19/2017 0846   CREATININE 0.75 04/08/2015 1049      Component Value Date/Time   CALCIUM 8.6 (L) 07/27/2017 0413   ALKPHOS 50 07/27/2017 0413   AST 27 07/27/2017 0413   AST 20 07/19/2017 0846   ALT 25 07/27/2017 0413   ALT 26 07/19/2017 0846   BILITOT 0.6 07/27/2017 0413   BILITOT 0.4 07/19/2017 0846       Impression and Plan:  72 year old man with:  1.    Bladder cancer diagnosed in March 2019.  He was found to have T2N0 high-grade urothelial carcinoma of the bladder with multifocal tumor.  He is status post definitive treatment with radiation and chemotherapy and has recovered reasonably well at this time.  He has no clinical signs or symptoms of disease relapse.  Risks and benefits of continuing active surveillance was reviewed today and is agreeable to continue.  We will repeat imaging studies in January and sooner if needed to he develops any symptoms.  He continues to follow with urology for surveillance purposes.  2.    Insufficiency: His creatinine remains stable and creatinine clearance is normal..   3.  Dysuria: Improving at this time after completing radiation therapy.  No hematuria or evidence of recurrence of recurrence at this time.  4.  Follow-up:  In 3 months to repeat CT scan.  15  minutes was spent with the patient face-to-face today.  More than 50% of time was dedicated to reviewing the natural course of this disease, treatment options and coordinating plan of care.   [name], MD 10/23/20199:39 AM

## 2017-11-22 ENCOUNTER — Other Ambulatory Visit: Payer: Self-pay | Admitting: Cardiology

## 2017-11-22 DIAGNOSIS — E1165 Type 2 diabetes mellitus with hyperglycemia: Secondary | ICD-10-CM | POA: Diagnosis not present

## 2017-11-22 DIAGNOSIS — J449 Chronic obstructive pulmonary disease, unspecified: Secondary | ICD-10-CM | POA: Diagnosis not present

## 2017-11-22 DIAGNOSIS — C679 Malignant neoplasm of bladder, unspecified: Secondary | ICD-10-CM | POA: Diagnosis not present

## 2017-11-22 DIAGNOSIS — I10 Essential (primary) hypertension: Secondary | ICD-10-CM | POA: Diagnosis not present

## 2018-01-09 ENCOUNTER — Other Ambulatory Visit: Payer: Self-pay | Admitting: Cardiology

## 2018-01-23 ENCOUNTER — Other Ambulatory Visit: Payer: Self-pay | Admitting: Cardiology

## 2018-01-23 NOTE — Telephone Encounter (Signed)
Rx request sent to pharmacy.  

## 2018-02-04 ENCOUNTER — Other Ambulatory Visit: Payer: Self-pay | Admitting: *Deleted

## 2018-03-05 ENCOUNTER — Inpatient Hospital Stay: Payer: Medicare HMO | Attending: Oncology

## 2018-03-05 ENCOUNTER — Encounter (HOSPITAL_COMMUNITY): Payer: Self-pay

## 2018-03-05 ENCOUNTER — Ambulatory Visit (HOSPITAL_COMMUNITY)
Admission: RE | Admit: 2018-03-05 | Discharge: 2018-03-05 | Disposition: A | Discharge status: Home / Self Care | Payer: Medicare HMO | Source: Ambulatory Visit | Attending: Oncology | Admitting: Oncology

## 2018-03-05 DIAGNOSIS — Z8551 Personal history of malignant neoplasm of bladder: Secondary | ICD-10-CM | POA: Diagnosis not present

## 2018-03-05 DIAGNOSIS — C678 Malignant neoplasm of overlapping sites of bladder: Secondary | ICD-10-CM

## 2018-03-05 DIAGNOSIS — C672 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: Secondary | ICD-10-CM | POA: Diagnosis not present

## 2018-03-05 DIAGNOSIS — R918 Other nonspecific abnormal finding of lung field: Secondary | ICD-10-CM

## 2018-03-05 DIAGNOSIS — N132 Hydronephrosis with renal and ureteral calculous obstruction: Secondary | ICD-10-CM | POA: Diagnosis not present

## 2018-03-05 HISTORY — DX: Other nonspecific abnormal finding of lung field: R91.8

## 2018-03-05 LAB — CBC WITH DIFFERENTIAL (CANCER CENTER ONLY)
Abs Immature Granulocytes: 0.01 K/uL (ref 0.00–0.07)
Basophils Absolute: 0 K/uL (ref 0.0–0.1)
Basophils Relative: 1 %
Eosinophils Absolute: 0.4 K/uL (ref 0.0–0.5)
Eosinophils Relative: 7 %
HCT: 44.1 % (ref 39.0–52.0)
Hemoglobin: 14.2 g/dL (ref 13.0–17.0)
Immature Granulocytes: 0 %
Lymphocytes Relative: 18 %
Lymphs Abs: 1.1 K/uL (ref 0.7–4.0)
MCH: 27.4 pg (ref 26.0–34.0)
MCHC: 32.2 g/dL (ref 30.0–36.0)
MCV: 85.1 fL (ref 80.0–100.0)
Monocytes Absolute: 0.7 K/uL (ref 0.1–1.0)
Monocytes Relative: 11 %
Neutro Abs: 3.7 K/uL (ref 1.7–7.7)
Neutrophils Relative %: 63 %
Platelet Count: 288 K/uL (ref 150–400)
RBC: 5.18 MIL/uL (ref 4.22–5.81)
RDW: 14.9 % (ref 11.5–15.5)
WBC Count: 6 K/uL (ref 4.0–10.5)
nRBC: 0 % (ref 0.0–0.2)

## 2018-03-05 LAB — CMP (CANCER CENTER ONLY)
ALT: 21 U/L (ref 0–44)
AST: 22 U/L (ref 15–41)
Albumin: 3.8 g/dL (ref 3.5–5.0)
Alkaline Phosphatase: 69 U/L (ref 38–126)
Anion gap: 11 (ref 5–15)
BUN: 10 mg/dL (ref 8–23)
CO2: 25 mmol/L (ref 22–32)
Calcium: 9.1 mg/dL (ref 8.9–10.3)
Chloride: 106 mmol/L (ref 98–111)
Creatinine: 0.96 mg/dL (ref 0.61–1.24)
GFR, Est AFR Am: >60 mL/min
GFR, Estimated: >60 mL/min
Glucose, Bld: 111 mg/dL — ABNORMAL HIGH (ref 70–99)
Potassium: 3.8 mmol/L (ref 3.5–5.1)
Sodium: 142 mmol/L (ref 135–145)
Total Bilirubin: 0.5 mg/dL (ref 0.3–1.2)
Total Protein: 7.4 g/dL (ref 6.5–8.1)

## 2018-03-05 MED ORDER — SODIUM CHLORIDE (PF) 0.9 % IJ SOLN
INTRAMUSCULAR | Status: AC
  Filled 2018-03-05: qty 50

## 2018-03-05 MED ORDER — IOHEXOL 300 MG/ML  SOLN
100.0000 mL | Freq: Once | INTRAMUSCULAR | Status: AC | PRN
  Administered 2018-03-05: 100 mL via INTRAVENOUS

## 2018-03-05 MED ORDER — SODIUM CHLORIDE 0.9 % IV SOLN
INTRAVENOUS | Status: AC
  Filled 2018-03-05: qty 250

## 2018-03-07 ENCOUNTER — Inpatient Hospital Stay: Payer: Medicare HMO | Admitting: Oncology

## 2018-03-11 ENCOUNTER — Other Ambulatory Visit: Payer: Self-pay | Admitting: Urology

## 2018-03-11 DIAGNOSIS — C678 Malignant neoplasm of overlapping sites of bladder: Secondary | ICD-10-CM | POA: Diagnosis not present

## 2018-03-31 NOTE — Patient Instructions (Signed)
[name]  03/31/2018   Your procedure is scheduled on: 04-04-2018  Report to [facility] Main  Entrance  Report to admitting at 930 AM    Call this number if you have problems the morning of surgery [phone]   Remember: Do not eat food or drink liquids :After Midnight. BRUSH YOUR TEETH MORNING OF SURGERY AND RINSE YOUR MOUTH OUT, NO CHEWING GUM CANDY OR MINTS.  How to Manage Your Diabetes Before and After Surgery  Why is it important to control my blood sugar before and after surgery? . Improving blood sugar levels before and after surgery helps healing and can limit problems. . A way of improving blood sugar control is eating a healthy diet by: o  Eating less sugar and carbohydrates o  Increasing activity/exercise o  Talking with your doctor about reaching your blood sugar goals . High blood sugars (greater than 180 mg/dL) can raise your risk of infections and slow your recovery, so you will need to focus on controlling your diabetes during the weeks before surgery. . Make sure that the doctor who takes care of your diabetes knows about your planned surgery including the date and location.  How do I manage my blood sugar before surgery? . Check your blood sugar at least 4 times a day, starting 2 days before surgery, to make sure that the level is not too high or low. o Check your blood sugar the morning of your surgery when you wake up and every 2 hours until you get to the Short Stay unit. . If your blood sugar is less than 70 mg/dL, you will need to treat for low blood sugar: o Do not take insulin. o Treat a low blood sugar (less than 70 mg/dL) with  cup of clear juice (cranberry or apple), 4 glucose tablets, OR glucose gel. o Recheck blood sugar in 15 minutes after treatment (to make sure it is greater than 70 mg/dL). If your blood sugar is not greater than 70 mg/dL on recheck, call [phone] for further instructions. . Report your blood sugar to the  short stay nurse when you get to Short Stay.  . If you are admitted to the hospital after surgery: o Your blood sugar will be checked by the staff and you will probably be given insulin after surgery (instead of oral diabetes medicines) to make sure you have good blood sugar levels. o The goal for blood sugar control after surgery is 80-180 mg/dL.   WHAT DO I DO ABOUT MY DIABETES MEDICATION?  [name] Do not take oral diabetes medicines (pills) the morning of surgery.  . THE DAY  BEFORE SURGERY, TAKE METFORMIN AS USUAL     . THE MORNING OF SURGERY, DO NOT TAKE METFORMIN  .      Take these medicines the morning of surgery with A SIP OF WATER: ALBUTEROL INHALER IF NEEDED AND BRING INHALER, ALBUTEROL NEUBULIZER, ARFORMOTEROL (BROVANA), SYMBICORT, SPIRIVA, AMLODIPINE (NORVASC), OMEPRAZOLE (PRILOSEC), TOVIAZ DO NOT TAKE ANY DIABETIC MEDICATIONS DAY OF YOUR SURGERY                                           Men may shave face and neck.   Do not bring valuables to the hospital. [facility] IS NOT  RESPONSIBLE   FOR VALUABLES.  Contacts, dentures or bridgework may not be worn into surgery.  Leave suitcase in the car. After surgery it may be brought to your room.     Patients discharged the day of surgery will not be allowed to drive home. IF YOU ARE HAVING SURGERY AND GOING HOME THE SAME DAY, YOU MUST HAVE AN ADULT TO DRIVE YOU HOME AND BE WITH YOU FOR 24 HOURS. YOU MAY GO HOME BY TAXI OR UBER OR ORTHERWISE, BUT AN ADULT MUST ACCOMPANY YOU HOME AND STAY WITH YOU FOR 24 HOURS.  Name and phone number of your driver:  Special Instructions: N/A              Please read over the following fact sheets you were given: _____________________________________________________________________             [facility] - Preparing for Surgery Before surgery, you can play an important role.  Because skin is not sterile, your skin needs to be as free of germs as possible.  You can reduce the  number of germs on your skin by washing with CHG (chlorahexidine gluconate) soap before surgery.  CHG is an antiseptic cleaner which kills germs and bonds with the skin to continue killing germs even after washing. Please DO NOT use if you have an allergy to CHG or antibacterial soaps.  If your skin becomes reddened/irritated stop using the CHG and inform your nurse when you arrive at Short Stay. Do not shave (including legs and underarms) for at least 48 hours prior to the first CHG shower.  You may shave your face/neck. Please follow these instructions carefully:  1.  Shower with CHG Soap the night before surgery and the  morning of Surgery.  2.  If you choose to wash your hair, wash your hair first as usual with your  normal  shampoo.  3.  After you shampoo, rinse your hair and body thoroughly to remove the  shampoo.                           4.  Use CHG as you would any other liquid soap.  You can apply chg directly  to the skin and wash                       Gently with a scrungie or clean washcloth.  5.  Apply the CHG Soap to your body ONLY FROM THE NECK DOWN.   Do not use on face/ open                           Wound or open sores. Avoid contact with eyes, ears mouth and genitals (private parts).                       Wash face,  Genitals (private parts) with your normal soap.             6.  Wash thoroughly, paying special attention to the area where your surgery  will be performed.  7.  Thoroughly rinse your body with warm water from the neck down.  8.  DO NOT shower/wash with your normal soap after using and rinsing off  the CHG Soap.                9.  Pat yourself dry with a clean towel.  10.  Wear clean pajamas.            11.  Place clean sheets on your bed the night of your first shower and do not  sleep with pets. Day of Surgery : Do not apply any lotions/deodorants the morning of surgery.  Please wear clean clothes to the hospital/surgery center.  FAILURE TO FOLLOW  THESE INSTRUCTIONS MAY RESULT IN THE CANCELLATION OF YOUR SURGERY PATIENT SIGNATURE_________________________________  NURSE SIGNATURE__________________________________  ________________________________________________________________________

## 2018-04-02 ENCOUNTER — Encounter (HOSPITAL_COMMUNITY): Payer: Self-pay | Admitting: Physician Assistant

## 2018-04-02 ENCOUNTER — Inpatient Hospital Stay (HOSPITAL_COMMUNITY)
Admission: RE | Admit: 2018-04-02 | Discharge: 2018-04-02 | Disposition: A | Discharge status: Home / Self Care | Payer: Medicare HMO | Source: Ambulatory Visit

## 2018-04-02 ENCOUNTER — Encounter (HOSPITAL_COMMUNITY): Payer: Self-pay | Admitting: Anesthesiology

## 2018-04-02 DIAGNOSIS — R319 Hematuria, unspecified: Secondary | ICD-10-CM

## 2018-04-02 HISTORY — DX: Hematuria, unspecified: R31.9

## 2018-04-02 NOTE — Patient Instructions (Addendum)
[name]     Your procedure is scheduled on: 04-04-2018  Report to [facility] Main  Entrance  Report to admitting at 930 AM    Call this number if you have problems the morning of surgery [phone]   Remember: Do not eat food or drink liquids :After Midnight. BRUSH YOUR TEETH MORNING OF SURGERY AND RINSE YOUR MOUTH OUT, NO CHEWING GUM CANDY OR MINTS.   How to Manage Your Diabetes Before and After Surgery  Why is it important to control my blood sugar before and after surgery? . Improving blood sugar levels before and after surgery helps healing and can limit problems. . A way of improving blood sugar control is eating a healthy diet by: o  Eating less sugar and carbohydrates o  Increasing activity/exercise o  Talking with your doctor about reaching your blood sugar goals . High blood sugars (greater than 180 mg/dL) can raise your risk of infections and slow your recovery, so you will need to focus on controlling your diabetes during the weeks before surgery. . Make sure that the doctor who takes care of your diabetes knows about your planned surgery including the date and location.  How do I manage my blood sugar before surgery? . Check your blood sugar at least 4 times a day, starting 2 days before surgery, to make sure that the level is not too high or low. o Check your blood sugar the morning of your surgery when you wake up and every 2 hours until you get to the Short Stay unit. . If your blood sugar is less than 70 mg/dL, you will need to treat for low blood sugar: o Do not take insulin. o Treat a low blood sugar (less than 70 mg/dL) with  cup of clear juice (cranberry or apple), 4 glucose tablets, OR glucose gel. o Recheck blood sugar in 15 minutes after treatment (to make sure it is greater than 70 mg/dL). If your blood sugar is not greater than 70 mg/dL on recheck, call [phone] for further instructions. . Report your blood sugar to the short  stay nurse when you get to Short Stay.  . If you are admitted to the hospital after surgery: o Your blood sugar will be checked by the staff and you will probably be given insulin after surgery (instead of oral diabetes medicines) to make sure you have good blood sugar levels. o The goal for blood sugar control after surgery is 80-180 mg/dL.   WHAT DO I DO ABOUT MY DIABETES MEDICATION?  [name] Do not take oral diabetes medicines (pills) the morning of surgery.  . THE day before  BEFORE SURGERY, take metformin as usual      . THE MORNING OF SURGERY do not take metformin.  .                    Take these medicines the morning of surgery with A SIP OF WATER: ALBUTEROL INHALER IF NEEDED AND BRING INHALER, ALBUTEROL NEBULIZER, ARFORMOTEROL (BROVANA), SYMBICORT, SPIRIVA, AMLODIPINE (NORVASC), OMEPRAZOLE (PRILOSEC), TOVIAZ, TAMSULOSIN (FLOMAX) DO NOT TAKE ANY DIABETIC MEDICATIONS DAY OF YOUR SURGERY                                             Men may shave face and  neck.   Do not bring valuables to the hospital. [place].  Contacts, dentures or bridgework may not be worn into surgery.  Leave suitcase in the car. After surgery it may be brought to your room.     Patients discharged the day of surgery will not be allowed to drive home. IF YOU ARE HAVING SURGERY AND GOING HOME THE SAME DAY, YOU MUST HAVE AN ADULT TO DRIVE YOU HOME AND BE WITH YOU FOR 24 HOURS. YOU MAY GO HOME BY TAXI OR UBER OR ORTHERWISE, BUT AN ADULT MUST ACCOMPANY YOU HOME AND STAY WITH YOU FOR 24 HOURS.  Name and phone number of your driver: [address] [place]  Special Instructions: N/A              Please read over the following fact sheets you were given: _____________________________________________________________________  [facility] - Preparing for Surgery Before surgery, you can play an important role.  Because skin is not sterile, your skin needs to  be as free of germs as possible.  You can reduce the number of germs on your skin by washing with CHG (chlorahexidine gluconate) soap before surgery.  CHG is an antiseptic cleaner which kills germs and bonds with the skin to continue killing germs even after washing. Please DO NOT use if you have an allergy to CHG or antibacterial soaps.  If your skin becomes reddened/irritated stop using the CHG and inform your nurse when you arrive at Short Stay. Do not shave (including legs and underarms) for at least 48 hours prior to the first CHG shower.  You may shave your face/neck. Please follow these instructions carefully:  1.  Shower with CHG Soap the night before surgery and the  morning of Surgery.  2.  If you choose to wash your hair, wash your hair first as usual with your  normal  shampoo.  3.  After you shampoo, rinse your hair and body thoroughly to remove the  shampoo.                           4.  Use CHG as you would any other liquid soap.  You can apply chg directly  to the skin and wash                       Gently with a scrungie or clean washcloth.  5.  Apply the CHG Soap to your body ONLY FROM THE NECK DOWN.   Do not use on face/ open                           Wound or open sores. Avoid contact with eyes, ears mouth and genitals (private parts).                       Wash face,  Genitals (private parts) with your normal soap.             6.  Wash thoroughly, paying special attention to the area where your surgery  will be performed.  7.  Thoroughly rinse your body with warm water from the neck down.  8.  DO NOT shower/wash with your normal soap after using and rinsing off  the CHG Soap.  9.  Pat yourself dry with a clean towel.            10.  Wear clean pajamas.            11.  Place clean sheets on your bed the night of your first shower and do not  sleep with pets. Day of Surgery : Do not apply any lotions/deodorants the morning of surgery.  Please wear clean clothes to  the hospital/surgery center.  FAILURE TO FOLLOW THESE INSTRUCTIONS MAY RESULT IN THE CANCELLATION OF YOUR SURGERY PATIENT SIGNATURE_________________________________  NURSE SIGNATURE__________________________________  ________________________________________________________________________

## 2018-04-02 NOTE — Progress Notes (Signed)
SPOKE W/  [name]     SCREENING SYMPTOMS OF COVID 19:   COUGH- SINCE 2003 RELATED TO ASTHMA  RUNNY NOSE--- NO  SORE THROAT---NO  SHORTNESS OF BREATH- NO  DIFFICULTY BREATHING---NO  TEMP >100.4-----NO  TRAVELLED OUT OF COUNTY---NO                                      STATE----NO                                      COUNTRY---- NO  HAVE YOU BEEN EXPOSED TO ANYONE WITH COVID 19? NO

## 2018-04-03 ENCOUNTER — Encounter (HOSPITAL_COMMUNITY): Payer: Self-pay

## 2018-04-03 ENCOUNTER — Other Ambulatory Visit: Payer: Self-pay

## 2018-04-03 ENCOUNTER — Encounter (HOSPITAL_COMMUNITY)
Admission: RE | Admit: 2018-04-03 | Discharge: 2018-04-03 | Disposition: A | Discharge status: Home / Self Care | Payer: Medicare HMO | Source: Ambulatory Visit | Attending: Urology | Admitting: Urology

## 2018-04-03 DIAGNOSIS — Z01818 Encounter for other preprocedural examination: Secondary | ICD-10-CM | POA: Diagnosis not present

## 2018-04-03 DIAGNOSIS — R079 Chest pain, unspecified: Secondary | ICD-10-CM | POA: Diagnosis not present

## 2018-04-03 HISTORY — DX: Hematuria, unspecified: R31.9

## 2018-04-03 HISTORY — DX: Chest pain, unspecified: R07.9

## 2018-04-03 LAB — CBC
HCT: 40.5 % (ref 39.0–52.0)
Hemoglobin: 12.9 g/dL — ABNORMAL LOW (ref 13.0–17.0)
MCH: 28 pg (ref 26.0–34.0)
MCHC: 31.9 g/dL (ref 30.0–36.0)
MCV: 87.9 fL (ref 80.0–100.0)
Platelets: 289 10*3/uL (ref 150–400)
RBC: 4.61 MIL/uL (ref 4.22–5.81)
RDW: 15 % (ref 11.5–15.5)
WBC: 9 10*3/uL (ref 4.0–10.5)
nRBC: 0 % (ref 0.0–0.2)

## 2018-04-03 LAB — BASIC METABOLIC PANEL
Anion gap: 12 (ref 5–15)
BUN: 15 mg/dL (ref 8–23)
CALCIUM: 8.8 mg/dL — AB (ref 8.9–10.3)
CO2: 25 mmol/L (ref 22–32)
Chloride: 102 mmol/L (ref 98–111)
Creatinine, Ser: 1.09 mg/dL (ref 0.61–1.24)
GFR calc Af Amer: >60 mL/min (ref >60)
GFR calc non Af Amer: >60 mL/min (ref >60)
Glucose, Bld: 173 mg/dL — ABNORMAL HIGH (ref 70–99)
Potassium: 3.7 mmol/L (ref 3.5–5.1)
Sodium: 139 mmol/L (ref 135–145)

## 2018-04-03 LAB — HEMOGLOBIN A1C
Hgb A1c MFr Bld: 6.6 % — ABNORMAL HIGH (ref 4.8–5.6)
Mean Plasma Glucose: 142.72 mg/dL

## 2018-04-03 LAB — GLUCOSE, CAPILLARY: Glucose-Capillary: 188 mg/dL — ABNORMAL HIGH (ref 70–99)

## 2018-04-03 NOTE — Progress Notes (Signed)
Anesthesia Chart Review   Case:  [id number] Date/Time:  04/04/18 1115   Procedures:      TRANSURETHRAL RESECTION OF BLADDER TUMOR (TURBT) (N/A )     BILATERAL RETROGRADE PYELOGRAMS RIGHT DIAGNOSTIC /URETEROSCOPY (Bilateral )   Anesthesia type:  General   Pre-op diagnosis:  BLADDER CANCER   Location:  [name] [facility]   Surgeon:  [name], MD      DISCUSSION:72 yo former smoker (50 pack years, quit 01/09/08) with h/o GERD, HLD, severe COPD, CAD (NSTEMI, BMS 0454), diastolic heart failure, HTN, DM II, bladder cancer scheduled for above procedure 04/04/18 with [name].   Last seen by cardiology 08/14/16.  Stable at this visit and cleared without further testing for TURBT at that time.  During admission 07/26/17-07/28/17 due to COPD exacerbation it is noted that CAD was stable at this time.  EF 50-55% on echo 07/27/17.   Pt with chronic cough and COPD, at baseline at PAT visit 04/04/18 per patient.    Discussed with Dr. [name].  Pt needs cardiac clearance prior to proceeding with planned procedure. Discussed with Dr. [name] scheduler.  VS: BP (!) 171/75   Pulse 93   Temp 36.8 C   Ht 5\' 4"  (1.626 m)   Wt 65.6 kg   SpO2 98%   BMI 24.84 kg/m   PROVIDERS: [name] Ebbs, MD is PCP pt last seen 11/22/17  LABS: Labs reviewed: Acceptable for surgery. (all labs ordered are listed, but only abnormal results are displayed)  Labs Reviewed  GLUCOSE, CAPILLARY - Abnormal; Notable for the following components:      Result Value   Glucose-Capillary 188 (*)    All other components within normal limits  BASIC METABOLIC PANEL - Abnormal; Notable for the following components:   Glucose, Bld 173 (*)    Calcium 8.8 (*)    All other components within normal limits  CBC - Abnormal; Notable for the following components:   Hemoglobin 12.9 (*)    All other components within normal limits  HEMOGLOBIN A1C - Abnormal; Notable for the following components:   Hgb A1c MFr  Bld 6.6 (*)    All other components within normal limits     IMAGES: CT Chest 03/05/18 IMPRESSION: 1. Circumferential bladder wall thickening including a more focal irregular wall thickening in the posterior bladder extending into the right UVJ. There is some focal hyper enhancement in this region concerning for recurrent disease. These changes also result in mild right hydroureteronephrosis. 2. No evidence for metastatic disease in the chest, abdomen or pelvis. 3. 8 mm posterior left upper lobe pulmonary nodule seen on the previous study has resolved in the interval. 4. Similar basilar tree-in-bud opacity with interstitial disease in the right lung base, suggesting sequelae of atypical infection. 5. Stable 4 mm left upper lobe nodule. Attention on follow-up recommended. 6. Stable 8 mm low-density lesion in the body of pancreas. Continued attention on follow-up imaging recommended. 7. Aortic Atherosclerois (ICD10-170.0) 8.  Emphysema. ([id number])  EKG: 04/03/18 Rate 87 bpm Normal sinus rhythm   CV: Echo 07/27/17 Study Conclusions  - Left ventricle: Wall thickness was increased in a pattern of   moderate LVH. Systolic function was normal. The estimated   ejection fraction was in the range of 50% to 55%. Doppler   parameters are consistent with abnormal left ventricular   relaxation (grade 1 diastolic dysfunction). - Aortic valve: Sclerosis without stenosis. - Aorta: There was mild dilation of the sinus(es) of Valsalva.  Aortic root dimension: 41 mm (ED). - Mitral valve: There was trivial regurgitation. - Right ventricle: Systolic function was normal. - Tricuspid valve: There was trivial regurgitation. - Pulmonic valve: There was no significant regurgitation. - Pericardium, extracardiac: A trivial pericardial effusion was   identified.  Impressions:  - Unchanged from prior study.  Stress Test 12/27/14  Ost LAD to Prox LAD lesion, 40% stenosed.  Ost 2nd Diag  to 2nd Diag lesion, 80% stenosed.  Mid Cx lesion, 50% stenosed.  The left ventricular systolic function is normal.  Patent stents in the RCA.  Mild to moderate left sided disease.   Continue aggressive secondary prevention.  He will be watched for bleeding at his radial site.  He will then go back to [facility] with further w/u per the internal medicine team. Past Medical History:  Diagnosis Date  . Allergic rhinitis   . CAD (coronary artery disease), native coronary artery cardiologist-  dr [name]   a. thought due to vasospasm . cath - CAD LAD 40-50%, LCX 40%, RCA 25% EF 65%. lexiscan myoview EF 68% w/o evidence of ischemia or infarct b. STEMI s/p BMS-mid RCA c. BMS x 2- prox RCA, PLOM  . Centriacinar emphysema ([place])   . Chest pain x 1 in last 6 months  . COPD, group C, by GOLD 2017 classification [facility]) pulmonologist-- dr [name]   former smoker--- severe OSA w/ hx multiple hospital admissions w/ exacerbation's  . Dyspnea    with exertion  . Dysuria   . Full dentures   . GERD (gastroesophageal reflux disease)   . Hematuria 04/02/2018  . History of non-ST elevation myocardial infarction (NSTEMI) 06/19/2009   in setting COPD w/ exacerbation;  negative myoview for ischemia;  previously had cardiac cath 06-08-2009 w/ diffuse nonobtructive CAD and  normal LVF  . History of prostatitis   . History of ST elevation myocardial infarction (STEMI) 02/15/2012   in setting of COPD w/ exacerbation; per cardiac cath PCI and stenting to RCA  . HTN (hypertension)   . Hyperlipidemia   . Lower abdominal pain   . 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 Type 2 diabetes mellitus ([place])   . Wears glasses     Past Surgical History:  Procedure Laterality Date  . CARDIAC CATHETERIZATION N/A 12/27/2014   Procedure: Left Heart Cath and Coronary Angiography;  Surgeon:  Jettie Booze, MD;  Location: [place] CV LAB;  Service: Cardiovascular;  Laterality: N/A; ost.LAD to pLAD 40%, ost.D2 to D2 80%, mCFx 50%, mild to moderate left sided disease, patent RCA stents, normal LVF  . CARDIAC CATHETERIZATION  06-08-2009   dr cooper   diffuse non-obstructive CAD , normal LVF  . CARDIOVASCULAR STRESS TEST  06-25-2012  dr [name]   intermediate risk nuclear stress study w/ medium-sized, mild primary reversible basal to mid inferior perfusion defect suggestive of ischemia/  normal LV function and wall motion ,ef 63%/  pt scheduled for cardiac cath  . CYSTOSCOPY W/ RETROGRADES Bilateral 09/07/2016   Procedure: CYSTOSCOPY WITH BILATERAL RETROGRADE PYELOGRAM;  Surgeon: [name], MD;  [facility];  Service: Urology;  Laterality: Bilateral;  . CYSTOSCOPY W/ RETROGRADES Bilateral 03/15/2017   Procedure: CYSTOSCOPY WITH RETROGRADE PYELOGRAM;  Surgeon: [name], MD;  Location: [facility];  Service: Urology;  Laterality: Bilateral;  . CYSTOSCOPY WITH RETROGRADE PYELOGRAM, URETEROSCOPY AND  STENT PLACEMENT Left 10/19/2016   Procedure: CYSTOSCOPY WITH RETROGRADE PYELOGRAM, URETEROSCOPY AND STENT PLACEMENT;  Surgeon: [name], MD;  [facility];  Service: Urology;  Laterality: Left;  . LEFT HEART CATHETERIZATION WITH CORONARY ANGIOGRAM N/A 02/15/2012   Procedure: LEFT HEART CATHETERIZATION WITH CORONARY ANGIOGRAM;  Surgeon: Peter M [name], MD;  Location: [facility] CATH LAB;  Service: Cardiovascular;  Laterality: N/A;  single vessel obstructive disease of the mid RCA, normal LVF  . LEFT HEART CATHETERIZATION WITH CORONARY ANGIOGRAM N/A 07/01/2012   Procedure: LEFT HEART CATHETERIZATION WITH CORONARY ANGIOGRAM;  Surgeon: Peter M [name], MD;  Location: [facility] CATH LAB;  Service: Cardiovascular;  Laterality: N/A;  class III angina and positive stress test; patent mRCA stent;  severe obstructive single vessel disease to new lesion proximal RCA & PLOM  branch; normal LVF  . LEFT HEART CATHETERIZATION WITH CORONARY ANGIOGRAM N/A 08/03/2013   Procedure: LEFT HEART CATHETERIZATION WITH CORONARY ANGIOGRAM;  Surgeon: Peter M [name], MD;  Location: [facility] CATH LAB;  Service: Cardiovascular;  Laterality: N/A;  nonobstuctive CAD, normal LVF, stents patent mRCA and pRCA, mild diffuse disease up to 30% of stent in the PLOM  . PERCUTANEOUS CORONARY STENT INTERVENTION (PCI-S) N/A 02/15/2012   Procedure: PERCUTANEOUS CORONARY STENT INTERVENTION (PCI-S);  Surgeon: Peter M [name], MD;  Location: [facility] CATH LAB;  Service: Cardiovascular;  Laterality: N/A;   BMS x1 to midRCA  . PERCUTANEOUS CORONARY STENT INTERVENTION (PCI-S)  07/01/2012   Procedure: PERCUTANEOUS CORONARY STENT INTERVENTION (PCI-S);  Surgeon: Peter M [name], MD;  Location: [facility] CATH LAB;  Service: Cardiovascular;;  BMS to pRCA; BMS to PLOM  . stents to heart x 2    . TRANSTHORACIC ECHOCARDIOGRAM  08/05/2016   moderate LVH, ef 44-01%, grade 1 diastolic dysfunction/ mild PR and TR, trivial pericardial effusion/   . TRANSURETHRAL RESECTION OF BLADDER TUMOR N/A 09/07/2016   Procedure: TRANSURETHRAL RESECTION OF BLADDER TUMOR (TURBT);  Surgeon: [name], MD;  [facility];  Service: Urology;  Laterality: N/A;  . TRANSURETHRAL RESECTION OF BLADDER TUMOR N/A 10/19/2016   Procedure: TRANSURETHRAL RESECTION OF BLADDER TUMOR (TURBT);  Surgeon: [name], MD;  [facility];  Service: Urology;  Laterality: N/A;  . TRANSURETHRAL RESECTION OF BLADDER TUMOR N/A 03/15/2017   Procedure: TRANSURETHRAL RESECTION OF BLADDER TUMOR (TURBT);  Surgeon: [name], MD;  Location: [facility];  Service: Urology;  Laterality: N/A;    MEDICATIONS: . albuterol (PROVENTIL HFA) 108 (90 BASE) MCG/ACT inhaler  . albuterol (PROVENTIL) (2.5 MG/3ML) 0.083% nebulizer solution  . amLODipine (NORVASC) 10 MG tablet  . arformoterol (BROVANA) 15 MCG/2ML NEBU  . aspirin EC 81 MG tablet  .  diclofenac sodium (VOLTAREN) 1 % GEL  . hyoscyamine (LEVSIN, ANASPAZ) 0.125 MG tablet  . ibuprofen (ADVIL,MOTRIN) 200 MG tablet  . metFORMIN (GLUCOPHAGE) 500 MG tablet  . nitroGLYCERIN (NITROSTAT) 0.4 MG SL tablet  . omeprazole (PRILOSEC) 20 MG capsule  . phenazopyridine (PYRIDIUM) 200 MG tablet  . polyethylene glycol (MIRALAX / GLYCOLAX) packet  . potassium chloride SA (K-DUR,KLOR-CON) 20 MEQ tablet  . rosuvastatin (CRESTOR) 40 MG tablet  . sildenafil (VIAGRA) 100 MG tablet  . SYMBICORT 160-4.5 MCG/ACT inhaler  . tamsulosin (FLOMAX) 0.4 MG CAPS capsule  . tiotropium (SPIRIVA HANDIHALER) 18 MCG inhalation capsule  . TOVIAZ 8 MG TB24 tablet  . Vitamin D, Ergocalciferol, (DRISDOL) 50000 units CAPS capsule   No current facility-administered medications for this encounter.     [name] Felix, PA-C [facility] Pre-Surgical Testing (  [phone] 04/03/18 2:45 PM

## 2018-04-03 NOTE — Progress Notes (Signed)
PCP: Rennis Petty  CARDIOLOGIST:none  INFO IN Epic:labs done 04-04-2018  INFO ON CHART:  BLOOD THINNERS AND LAST DOSES: 81 mg aspirin today ____________________________________  PATIENT SYMPTOMS AT TIME OF PREOP: chronic cough due to copd

## 2018-04-03 NOTE — Progress Notes (Signed)
Vietnamese interpretrer  y 'hin email confirmed for surgery 04-04-2018 and placed on chart

## 2018-04-04 ENCOUNTER — Encounter (HOSPITAL_COMMUNITY): Admission: RE | Payer: Self-pay | Source: Home / Self Care

## 2018-04-04 ENCOUNTER — Ambulatory Visit (HOSPITAL_COMMUNITY): Admission: RE | Admit: 2018-04-04 | Payer: Medicare HMO | Source: Home / Self Care | Admitting: Urology

## 2018-04-04 SURGERY — TURBT (TRANSURETHRAL RESECTION OF BLADDER TUMOR)
Anesthesia: General

## 2018-04-14 ENCOUNTER — Telehealth: Payer: Self-pay

## 2018-04-14 DIAGNOSIS — E1165 Type 2 diabetes mellitus with hyperglycemia: Secondary | ICD-10-CM | POA: Diagnosis not present

## 2018-04-14 DIAGNOSIS — J449 Chronic obstructive pulmonary disease, unspecified: Secondary | ICD-10-CM | POA: Diagnosis not present

## 2018-04-14 DIAGNOSIS — I10 Essential (primary) hypertension: Secondary | ICD-10-CM | POA: Diagnosis not present

## 2018-04-14 DIAGNOSIS — C679 Malignant neoplasm of bladder, unspecified: Secondary | ICD-10-CM | POA: Diagnosis not present

## 2018-04-14 DIAGNOSIS — Z0000 Encounter for general adult medical examination without abnormal findings: Secondary | ICD-10-CM | POA: Diagnosis not present

## 2018-04-14 DIAGNOSIS — E7849 Other hyperlipidemia: Secondary | ICD-10-CM | POA: Diagnosis not present

## 2018-04-14 NOTE — Telephone Encounter (Signed)
Called daughter Wanda Plump she states that she was going to call and cancel. She will CB when it is rescheduled

## 2018-04-21 ENCOUNTER — Ambulatory Visit: Payer: Medicare HMO | Admitting: Adult Health

## 2018-04-24 ENCOUNTER — Telehealth: Payer: Self-pay | Admitting: Adult Health

## 2018-04-24 NOTE — Telephone Encounter (Signed)
I contact [facility] back- and advised via voicemail of message from NP.  Advised if this was an emergency to call back and we could work something out or get patient scheduled.  Gave call back number.

## 2018-04-24 NOTE — Telephone Encounter (Signed)
Please advise, they would like to have patient on the schedule according to [facility].  Any openings?

## 2018-04-24 NOTE — Telephone Encounter (Signed)
New Message    Pam is calling and would like to get the Pt back on the schedule for a pre op clearance   Please call back

## 2018-04-24 NOTE — Telephone Encounter (Signed)
I am not sure about how we are getting people back on the pre-op evaluation schedule.  They usually need to have an EKG and in person physical assessment.  This is not likely going to be done for over a month or 6 weeks due to uncertainty of office re-opening.  I think he has a virtual office visit coming up.

## 2018-04-25 ENCOUNTER — Telehealth: Payer: Self-pay | Admitting: Adult Health

## 2018-04-25 NOTE — Telephone Encounter (Signed)
New Message    Pts daughter is calling back    Please call

## 2018-04-25 NOTE — Telephone Encounter (Signed)
Dr. Ellyn Hack can see in-person visit for preop clearance on Monday at either 8:40 or 10:20 am. Please call patient and schedule one of these times. He will also need an interpreter. Please message me once this visit it scheduled.  Please let the requesting surgeon's office know after his appt is made.   Tami Lin Duke, PA-C 04/25/2018, 3:07 PM

## 2018-04-25 NOTE — Telephone Encounter (Signed)
Follow up:    Pam from [facility] would like for you to call her concerning a appt with this patient.

## 2018-04-25 NOTE — Telephone Encounter (Signed)
Called, spoke with Pam regarding preop from [facility]. She states that the daughter has called in and the patient is having some issues, with bleeding as he has bladder cancer. They are wanting to reschedule his surgery and have it done- original date was April 04, 2018, but it was cancelled due to cardiac reasons and they needed clearance- patient was on the schedule to be seen 04/21/2018, but that was cancelled due to the Covid-19, they are now still needing patient to be seen so they can reschedule after clearance is given. I advised that the only way to do an in person visit currently is to see our DOD. I advised I would route the message to our Preop pool, for them to decide and give recommendations.   If appointment is needed please call daughter Al Corpus- [phone] as she helps her father.

## 2018-04-25 NOTE — Telephone Encounter (Signed)
Attempted to contact patient, via daughter- LVM on her number to call back to discuss the appointment. Will wait to see if she calls back, I will try again.

## 2018-04-25 NOTE — Telephone Encounter (Signed)
Called, spoke with daughter. They scheduled for 8:20 appointment as 8:40 was blocked and unable to click, they are aware he is seeing virtual visits in office as well, and will be with him as soon as possible in office. Note that Interpreter is needed.  Surgeons office notified, via voicemail that patient scheduled for Monday.

## 2018-04-25 NOTE — Telephone Encounter (Signed)
Spoke with daughter, see previous messages.

## 2018-04-27 ENCOUNTER — Encounter: Payer: Self-pay | Admitting: Cardiology

## 2018-04-27 DIAGNOSIS — Z01810 Encounter for preprocedural cardiovascular examination: Secondary | ICD-10-CM | POA: Insufficient documentation

## 2018-04-27 NOTE — Progress Notes (Deleted)
PCP: [name], MD Urologist: Dr. [name]  Clinic Note: No chief complaint on file.   HPI: [name] is a 73 y.o. male with a PMH notable for CAD (with prior non-STEMI, STEMI with bare-metal stent PCI in 3 locations-proximal RCA,-mid RCA and RPAV --all patent catheterization July 2016) and below who presents today for Pre-Operative CV Evaluation at the request of Dr. [name] (urology).  [name] was last seen on ***  Recent Hospitalizations: ***  Studies Personally Reviewed - (if available, images/films reviewed: From Epic Chart or Care Everywhere)  ***  Interval History: ***   No chest pain or shortness of breath with rest or exertion. No PND, orthopnea or edema. No palpitations, lightheadedness, dizziness, weakness or syncope/near syncope. No TIA/amaurosis fugax symptoms. No melena, hematochezia, hematuria, or epstaxis. No claudication.  ROS: A comprehensive was performed. ROS   I have reviewed and (if needed) personally updated the patient's problem list, medications, allergies, past medical and surgical history, social and family history.   Past Medical History:  Diagnosis Date  . Allergic rhinitis   . CAD S/P BMS PCI (mid RCA, prox RCA & RPAV)    cardiologist-  Dr. [name]: a. NSTEMI 06/2009: ?  vasospasm / Demand ischemia (moderate CAD on Cath) w/ Negative Lexiscan Myoview; b). 02/2012: STEMI s/p BMS-mid RCA. c). Exertional Angina w/ + MYOVIEW -->  BMS PCI x 2- prox RCA, rPAV  . Centriacinar emphysema ([place])   . Chest pain x 1 in last 6 months  . COPD, group C, by GOLD 2017 classification [facility]) pulmonologist-- dr mcquid   former smoker--- severe OSA w/ hx multiple hospital admissions w/ exacerbation's  . Dyspnea    with exertion  . Dysuria   . Full dentures   . GERD (gastroesophageal reflux disease)   . Hematuria 04/02/2018  . History of non-ST elevation myocardial infarction (NSTEMI) 06/19/2009   in setting COPD w/ exacerbation;  negative myoview for  ischemia;  previously had cardiac cath 06-08-2009 w/ diffuse nonobtructive CAD and  normal LVF  . History of prostatitis   . History of ST elevation myocardial infarction (STEMI) 02/15/2012   in setting of COPD w/ exacerbation; per cardiac cath PCI and stenting to RCA  . HTN (hypertension)   . Hyperlipidemia   . Lower abdominal pain   . 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 Type 2 diabetes mellitus ([place])   . Wears glasses     Past Surgical History:  Procedure Laterality Date  . CARDIAC CATHETERIZATION N/A 12/27/2014   Procedure: Left Heart Cath and Coronary Angiography;  Surgeon: Jettie Booze, MD;  Location: [place] CV LAB;  Service: Cardiovascular;  Laterality: N/A; Ost-prox LAD 40%, ost D2 (small) 80%, mCFx 50%, mild to moderate left sided disease, patent RCA (p, m & RPAV) stents, normal LVF  . CYSTOSCOPY W/ RETROGRADES Bilateral 09/07/2016   Procedure: CYSTOSCOPY WITH BILATERAL RETROGRADE PYELOGRAM;  Surgeon: [name], MD;  [facility];  Service: Urology;  Laterality: Bilateral;  . CYSTOSCOPY W/ RETROGRADES Bilateral 03/15/2017   Procedure: CYSTOSCOPY WITH RETROGRADE PYELOGRAM;  Surgeon: [name], MD;  Location: [facility];  Service: Urology;  Laterality: Bilateral;  . CYSTOSCOPY WITH RETROGRADE PYELOGRAM, URETEROSCOPY AND STENT PLACEMENT Left 10/19/2016   Procedure: [place], URETEROSCOPY AND STENT PLACEMENT;  Surgeon: [name], MD;  Location: [facility];  Service: Urology;  Laterality: Left;  . LEFT HEART CATH AND CORONARY ANGIOGRAPHY  06/08/2009   Dr. Burt Knack: LAD 40-50%, LCX 40%, RCA 25% EF 65%  . LEFT HEART CATHETERIZATION WITH CORONARY ANGIOGRAM N/A 02/15/2012   Procedure: LEFT HEART CATHETERIZATION WITH CORONARY ANGIOGRAM;  Surgeon: Peter M [name], MD;  Location: [facility]  CATH LAB;  Service: Cardiovascular;  Laterality: N/A;  single vessel obstructive disease of the mid RCA, normal LVF  . LEFT HEART CATHETERIZATION WITH CORONARY ANGIOGRAM N/A 07/01/2012   Procedure: LEFT HEART CATHETERIZATION WITH CORONARY ANGIOGRAM;  Surgeon: Peter M [name], MD;  Location: [facility] CATH LAB;  Service: Cardiovascular;  Laterality: N/A;  class III angina and positive stress test; patent mRCA stent;  severe obstructive single vessel disease to new lesion proximal RCA & PLOM branch; normal LVF  . LEFT HEART CATHETERIZATION WITH CORONARY ANGIOGRAM N/A 08/03/2013   Procedure: LEFT HEART CATHETERIZATION WITH CORONARY ANGIOGRAM;  Surgeon: Peter M [name], MD;  Location: [facility] CATH LAB;  Service: Cardiovascular;  Laterality: N/A;  nonobstuctive CAD, normal LVF, stents patent mRCA and pRCA, RPAV ~30% ISR  . NM MYOVIEW LTD  06/25/2012   Dr. [name]: INTERMEDIATE RISK - w/ medium-sized, mild primary reversible basal to mid inferior perfusion defect suggestive of ischemia/  normal LV function and wall motion ,ef 63% --> lead to CATH & BMS PCI of  pRCA & RPAV.  [name] PERCUTANEOUS CORONARY STENT INTERVENTION (PCI-S) N/A 02/15/2012   Procedure: PERCUTANEOUS CORONARY STENT INTERVENTION (PCI-S);  Surgeon: Peter M [name], MD;  Location: [facility] CATH LAB;  Service: Cardiovascular;  Laterality: N/A;   Veriflex BMS 3.0 x 12 (3.75 mm) to midRCA  . PERCUTANEOUS CORONARY STENT INTERVENTION (PCI-S)  07/01/2012   Procedure: PERCUTANEOUS CORONARY STENT INTERVENTION (PCI-S);  Surgeon: Peter M [name], MD;  Location: [facility] CATH LAB;  Service: Cardiovascular;;  pRCA PCI - Veriflex BMS 4.0 x 12; RPAV PCI - Veriflex BMS 3.0 x 12 BMS  . stents to heart x 2    . TRANSTHORACIC ECHOCARDIOGRAM  07/2017   Mod LVH, EF 50-55%, Gr 1 DD. mild PR and TR, trivial pericardial effusion (no change from 07/2016)  . TRANSURETHRAL RESECTION OF BLADDER TUMOR N/A 09/07/2016   Procedure: TRANSURETHRAL RESECTION OF BLADDER TUMOR (TURBT);  Surgeon: [name],  MD;  [facility];  Service: Urology;  Laterality: N/A;  . TRANSURETHRAL RESECTION OF BLADDER TUMOR N/A 10/19/2016   Procedure: TRANSURETHRAL RESECTION OF BLADDER TUMOR (TURBT);  Surgeon: [name], MD;  [facility];  Service: Urology;  Laterality: N/A;  . TRANSURETHRAL RESECTION OF BLADDER TUMOR N/A 03/15/2017   Procedure: TRANSURETHRAL RESECTION OF BLADDER TUMOR (TURBT);  Surgeon: [name], MD;  Location: [facility];  Service: Urology;  Laterality: N/A;    No outpatient medications have been marked as taking for the 04/28/18 encounter (Appointment) with Leonie Man, MD.    No Known Allergies  Social History   Tobacco Use  . Smoking status: Former Smoker    Packs/day: 1.00    Years: 50.00    Pack years: 50.00    Types: Cigarettes    Last attempt to quit: 01/09/2008    Years since quitting: 10.3  . Smokeless tobacco: Never Used  . Tobacco comment: rolled own cigarettes since age of 70,   Substance Use Topics  . Alcohol use: Yes    Comment: occ  . Drug use: No   Social History   Social History Narrative   Married, 4 children.  Disability secondary to lung disease.    No known family history of premature CAD.    family history is not on file.  Wt Readings from Last 3 Encounters:  04/03/18 144 lb 11.2 oz (65.6 kg)  10/30/17 146 lb 6.4 oz (66.4 kg)  07/28/17 143 lb 11.8 oz (65.2 kg)    PHYSICAL EXAM There were no vitals taken for this visit. Physical Exam    Adult ECG Report  Rate: *** ;  Rhythm: {rhythm:17366};   Narrative Interpretation: ***   Other studies Reviewed: Additional studies/ records that were reviewed today include:  Recent Labs:   CBC Latest Ref Rng & Units 04/03/2018 03/05/2018 10/30/2017  WBC 4.0 - 10.5 K/uL 9.0 6.0 7.1  Hemoglobin 13.0 - 17.0 g/dL 12.9(L) 14.2 13.9  Hematocrit 39.0 - 52.0 % 40.5 44.1 43.1  Platelets 150 - 400 K/uL 289 288 255   Lab Results  Component Value Date   CHOL 171  08/05/2016   HDL 24 (L) 08/05/2016   LDLCALC 128 (H) 08/05/2016   LDLDIRECT 148.5 08/22/2012   TRIG 97 08/05/2016   CHOLHDL 7.1 08/05/2016   Lab Results  Component Value Date   CREATININE 1.09 04/03/2018   BUN 15 04/03/2018   NA 139 04/03/2018   K 3.7 04/03/2018   CL 102 04/03/2018   CO2 25 04/03/2018   ASSESSMENT / PLAN: Problem List Items Addressed This Visit    Essential hypertension - Primary (Chronic)   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 >  50% of the time was spent in direct patient consultation.   Current medicines are reviewed at length with the patient today.  (+/- concerns) *** The following changes have been made:  ***  There are no Patient Instructions on file for this visit.   Studies Ordered:   No orders of the defined types were placed in this encounter.     Glenetta Hew, M.D., M.S. Interventional Cardiologist   Pager # [phone] Phone # [phone] [address] [place], [place] [address]   Thank you for choosing [facility]!!

## 2018-04-28 ENCOUNTER — Ambulatory Visit: Payer: Medicare HMO | Admitting: Cardiology

## 2018-04-28 NOTE — Progress Notes (Deleted)
PATIENT WAS A NO-SHO  [name] Hew, MD

## 2018-05-26 ENCOUNTER — Encounter: Payer: Self-pay | Admitting: *Deleted

## 2018-05-26 ENCOUNTER — Ambulatory Visit (INDEPENDENT_AMBULATORY_CARE_PROVIDER_SITE_OTHER): Payer: Medicare HMO | Admitting: Cardiology

## 2018-05-26 ENCOUNTER — Other Ambulatory Visit: Payer: Self-pay

## 2018-05-26 VITALS — HR 86 | Temp 99.3°F | Ht 64.0 in | Wt 150.2 lb

## 2018-05-26 DIAGNOSIS — I10 Essential (primary) hypertension: Secondary | ICD-10-CM | POA: Diagnosis not present

## 2018-05-26 DIAGNOSIS — I2510 Atherosclerotic heart disease of native coronary artery without angina pectoris: Secondary | ICD-10-CM

## 2018-05-26 DIAGNOSIS — E785 Hyperlipidemia, unspecified: Secondary | ICD-10-CM

## 2018-05-26 DIAGNOSIS — E1159 Type 2 diabetes mellitus with other circulatory complications: Secondary | ICD-10-CM | POA: Diagnosis not present

## 2018-05-26 DIAGNOSIS — Z01810 Encounter for preprocedural cardiovascular examination: Secondary | ICD-10-CM

## 2018-05-26 DIAGNOSIS — I2119 ST elevation (STEMI) myocardial infarction involving other coronary artery of inferior wall: Secondary | ICD-10-CM

## 2018-05-26 DIAGNOSIS — E1169 Type 2 diabetes mellitus with other specified complication: Secondary | ICD-10-CM

## 2018-05-26 DIAGNOSIS — J441 Chronic obstructive pulmonary disease with (acute) exacerbation: Secondary | ICD-10-CM

## 2018-05-26 DIAGNOSIS — Z9861 Coronary angioplasty status: Secondary | ICD-10-CM

## 2018-05-26 NOTE — Patient Instructions (Addendum)
Medication Instructions:  Your physician recommends that you continue on your current medications as directed. Please refer to the Current Medication list given to you today.  If you need a refill on your cardiac medications before your next appointment, please call your pharmacy.   Lab work: NONE  Testing/Procedures: NONE  Follow-Up: At [facility], you and your health needs are our priority.  As part of our continuing mission to provide you with exceptional heart care, we have created designated Provider Care Teams.  These Care Teams include your primary Cardiologist (physician) and Advanced Practice Providers (APPs -  Physician Assistants and Nurse Practitioners) who all work together to provide you with the care you need, when you need it. You will need a follow up appointment in 6 months.  Please call our office 2 months in advance to schedule this appointment.  You may see [name]  or one of the following Advanced Practice Providers on your designated Care Team: [name], [place] . Fabian Sharp, PA-C  Any Other Special Instructions Will Be Listed Below (If Applicable). YOU ARE CLEARED FOR UPCOMING SURGERY --from a cardiac standpoint

## 2018-05-26 NOTE — Progress Notes (Signed)
PCP: [name] Ebbs, MD  Cardiologist: [name] Oncologist: [name]  Clinic Note: Chief Complaint  Patient presents with  . Pre-op Exam    For bladder surgery ([name])  . Coronary Artery Disease    PMS 2 proximal, mid RCA and RPA V.  Mild disease elsewhere.  Last echo normal.  . COPD    Chronic persistent exertional dyspnea and cough.    HPI: [name] is a 73 y.o. male who is being seen today for the evaluation of Pre-op CV exam at the request of [name] Ebbs, MD.,  And Dr. Glenetta Borg [name] was last seen by [name] Deforest, PA on August 14, 2016 for preop evaluation (having last seen [name] in March 2017) for TURBT by [name].  That time the only chest discomfort noted have been during his COPD exacerbation. --Apparently he was not on Crestor at that time, and was post to have had lipids checked after restarting  Recent Hospitalizations: None since his last surgery  Studies Personally Reviewed - (if available, images/films reviewed: From Epic Chart or Care Everywhere)  LHC-PCI February 15, 2012: (Acute inferior STEMI): Dominant RCA, mid vessel focal 99% --> BMS PCI (Veriflex BMS 3.5 mg 12 mm--3.75 mm).  Diffuse 20 to 30% distally with 50% RPL.  Mid LCx 50-60%.  Mid LAD 30 and 40% at D1 with 50% ostial D1.  LHC-PCI July 01, 2012: [facility] with inferior ischemia) -proximal RCA 90%, widely patent mid RCA stent with diffuse 30-40% distal RCA, RPL focal 80% (BMS PCI x2: PRCA Veriflex BMS 4.0 mm x 12 mm -4.1 mm; PAV -Veriflex BMS 3.0 mm 12 mm - 3.1 mm).  30% P-M LAD.  20% circumflex marginal.  EF 55-65%.  LHC July 2015: (COPD exacerbation): Mild LAD (mid 40%).  Mild circumflex.  Patent mid RCA and PAB stents with mild diffuse 30% disease elsewhere.  Normal LV function.[name]   LHC on 12/27/2014: 40% ost LAD, 80% ost D2, 50% mid LCx, patent stent in RCA. Aggressive secondary prevention was recommended.   2D Echo July 2019: EF 50 to 55%.  No R WMA.  GR 1 DD.  Aortic valve  sclerosis but no stenosis.  Mild ascending aortic dilation (41 mm). Unchanged.  History provided with the assistance of contracted [name] interpreter [name] [phone])  Interval History: [name] presents here today for his first evaluation in several years.  Interview was conducted with an interpreter which makes it somewhat difficult to get a straight answer.  He says that he has chronic exertional dyspnea all the time that he relates to his "asthma ".  He at the same time says that he does plenty of walking and chasing his grandkids.  He does household work Social research officer, government., but if he tries to go fast or do too much, he will get short of breath. He is not really able to tell me the symptoms he had at the time of his MI, This was associated with a COPD exacerbation and that is all he remembers.  He does not recall having chest pain.  All he recalls his dyspnea.  Despite this, he has not noticed any progression of symptoms since his prior catheterization showing patent stents.  He denies any PND, orthopnea or edema.  No chest pain/pressure with rest or exertion.  No palpitations, lightheadedness, dizziness, weakness or syncope/near syncope. No TIA/amaurosis fugax symptoms. No melena, hematochezia, hematuria, or epstaxis. No claudication.  ROS: A comprehensive was performed. Review of Systems  Constitutional: Negative for chills,  fever, malaise/fatigue and weight loss.  HENT: Negative for congestion and nosebleeds.   Respiratory: Positive for cough, shortness of breath (At baseline, worse with exertion, but stable) and wheezing (Off and on, but not recently.). Negative for sputum production (Not usually besides just scant phlegm).   Gastrointestinal: Negative for abdominal pain, blood in stool, constipation, heartburn, melena, nausea and vomiting.  Genitourinary: Positive for hematuria (Off and on).  Musculoskeletal: Negative for falls and joint pain.  Neurological: Negative for dizziness and focal  weakness.  Psychiatric/Behavioral: Negative.   All other systems reviewed and are negative.   The patient does not have symptoms concerning for COVID-19 infection (fever, chills, cough, or new shortness of breath).  Has baseline dyspnea.  No new changes. The patient is practicing social distancing.   COVID-19 Education: The signs and symptoms of COVID-19 were discussed with the patient and how to seek care for testing (follow up with PCP or arrange E-visit).   The importance of social distancing was discussed today.  I have reviewed and (if needed) personally updated the patient's problem list, medications, allergies, past medical and surgical history, social and family history.   Past Medical History:  Diagnosis Date  . Allergic rhinitis   . CAD S/P BMS PCI (mid RCA, prox RCA & RPAV)    cardiologist-  [name]: a). NSTEMI/Demand 06/2009: Mod CAD & (-) Myoview; b). 02/2012: Inf STEMI s/p Veriflex BMS PCI (3.5 x 12 - 3.75 mm) -mid RCA. c). Exertional Angina / + MYOVIEW -->  BMS PCI x 2- prox RCA (Veriflex BMS 4.0 x 12), rPAV (3.0 x 12);; d) LAST CATH 12/2014: 40% ost LAD, 80% ostial D2, 50% midCx.  Patent RCA - RPAV/PL stents.  Medical Rx  . Centriacinar emphysema ([place])   . Chest pain x 1 in last 6 months  . COPD, group C, by GOLD 2017 classification [facility]) pulmonologist-- dr mcquid   former smoker--- severe OSA w/ hx multiple hospital admissions w/ exacerbation's  . Dysuria   . Essential hypertension   . Full dentures   . GERD (gastroesophageal reflux disease)   . Hematuria 04/02/2018  . History of non-ST elevation myocardial infarction (NSTEMI) 06/19/2009   in setting COPD w/ exacerbation;  negative myoview for ischemia;  previously had cardiac cath 06-08-2009 w/ diffuse nonobtructive CAD and  normal LVF  . History of prostatitis   . History of ST elevation myocardial infarction (STEMI) 02/15/2012   in setting of COPD w/ exacerbation; per cardiac cath PCI and stenting to RCA  .  Hyperlipidemia   . Lower abdominal pain   . 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 Type 2 diabetes mellitus ([place])   . Wears glasses     Past Surgical History:  Procedure Laterality Date  . CARDIAC CATHETERIZATION N/A 12/27/2014   Procedure: Left Heart Cath and Coronary Angiography;  Surgeon: Jettie Booze, MD;  Location: [place] CV LAB;  Service: Cardiovascular;  Laterality: N/A; Ost-prox LAD 40%, ost D2 (small) 80%, mCFx 50%, mild to moderate left sided disease, patent RCA (p, m & RPAV) stents, normal LVF  . CYSTOSCOPY W/ RETROGRADES Bilateral 09/07/2016   Procedure: CYSTOSCOPY WITH BILATERAL RETROGRADE PYELOGRAM;  Surgeon: [name], MD;  Location: [facility];  Service: Urology;  Laterality: Bilateral;  . CYSTOSCOPY W/ RETROGRADES Bilateral 03/15/2017   Procedure: CYSTOSCOPY WITH RETROGRADE PYELOGRAM;  Surgeon: [name],  Viona Gilmore, MD;  Location: [facility];  Service: Urology;  Laterality: Bilateral;  . CYSTOSCOPY WITH RETROGRADE PYELOGRAM, URETEROSCOPY AND STENT PLACEMENT Left 10/19/2016   Procedure: CYSTOSCOPY WITH RETROGRADE PYELOGRAM, URETEROSCOPY AND STENT PLACEMENT;  Surgeon: [name], MD;  Location: [facility];  Service: Urology;  Laterality: Left;  . LEFT HEART CATH AND CORONARY ANGIOGRAPHY  06/08/2009   Dr. Burt Knack: LAD 40-50%, LCX 40%, RCA 25% EF 65%m -thought to be related to coronary spasm.  [name] LEFT HEART CATHETERIZATION WITH CORONARY ANGIOGRAM N/A 02/15/2012   Procedure: LEFT HEART CATHETERIZATION WITH CORONARY ANGIOGRAM;  Surgeon: Peter M [name], MD;  Location: [facility] CATH LAB;  Service: Cardiovascular; inferior STEMI:  mid RCA 99% (BMS PCI), normal LVF, diffuse 20-30% distal RCA and 50% RPL/PAB.  Mid LCx 50-60%.  Mid LAD 30-40% at D1 with ostial D1 50%  . LEFT HEART CATHETERIZATION WITH CORONARY ANGIOGRAM N/A 07/01/2012    Procedure: LEFT HEART CATHETERIZATION WITH CORONARY ANGIOGRAM;  Surgeon: Peter M [name], MD;  Location: [facility] CATH LAB;  Service: Cardiovascular:: CLASS III ANGINA & + MYOVIEW (inf Ischemia) -- pRCA RCA 90% (BMS PCI), widely patent mid RCA BMS & diffuse 30-40% dist RCA, RPAV focal 80% (BMS PCI).  30% P-M LAD.  20% circumflex marginal.  EF 55-65%.  [name] LEFT HEART CATHETERIZATION WITH CORONARY ANGIOGRAM N/A 08/03/2013   Procedure: LEFT HEART CATHETERIZATION WITH CORONARY ANGIOGRAM;  Surgeon: Peter M [name], MD;  Location: [facility] CATH LAB;  Service: Cardiovascular;  Laterality: N/A;  nonobstuctive CAD, --  Mild LAD (mid 40%).  Mild circumflex.  Patent mid RCA and PAB stents with mild diffuse 30% disease elsewhere (~30% RPAV ISR).  Normal LV function..   . NM MYOVIEW LTD  06/25/2012   [name]: INTERMEDIATE RISK - w/ medium-sized, mild primary reversible basal to mid inferior perfusion defect suggestive of ischemia/  normal LV function and wall motion ,ef 63% --> lead to CATH & BMS PCI of  pRCA & RPAV.  [name] PERCUTANEOUS CORONARY STENT INTERVENTION (PCI-S) N/A 02/15/2012   Procedure: PERCUTANEOUS CORONARY STENT INTERVENTION (PCI-S);  Surgeon: Peter M [name], MD;  Location: [facility] CATH LAB;  Service: Cardiovascular:: mRCA 99% -- Veriflex BMS 3.0 x 12 (3.75 mm) -insetting of inferior STEMI  . PERCUTANEOUS CORONARY STENT INTERVENTION (PCI-S)  07/01/2012   Procedure: PERCUTANEOUS CORONARY STENT INTERVENTION (PCI-S);  Surgeon: Peter M [name], MD;  Location: [facility] CATH LAB;  Service: Cardiovascular;;  pRCA PCI - Veriflex BMS 4.0 x 12; RPAV PCI - Veriflex BMS 3.0 x 12   . stents to heart x 2    . TRANSTHORACIC ECHOCARDIOGRAM  07/2017   Mod LVH, EF 50-55%, Gr 1 DD. mild PR and TR, trivial pericardial effusion (no change from 07/2016)  . TRANSURETHRAL RESECTION OF BLADDER TUMOR N/A 09/07/2016   Procedure: TRANSURETHRAL RESECTION OF BLADDER TUMOR (TURBT);  Surgeon: [name], MD;  Location: [facility];  Service: Urology;   Laterality: N/A;  . TRANSURETHRAL RESECTION OF BLADDER TUMOR N/A 10/19/2016   Procedure: TRANSURETHRAL RESECTION OF BLADDER TUMOR (TURBT);  Surgeon: [name], MD;  Location: [facility];  Service: Urology;  Laterality: N/A;  . TRANSURETHRAL RESECTION OF BLADDER TUMOR N/A 03/15/2017   Procedure: TRANSURETHRAL RESECTION OF BLADDER TUMOR (TURBT);  Surgeon: [name], MD;  Location: [facility];  Service: Urology;  Laterality: N/A;    Current Meds  Medication Sig  . albuterol (PROVENTIL HFA) 108 (90 BASE) MCG/ACT inhaler Inhale 2 puffs into the lungs 2 (two) times  daily.  . albuterol (PROVENTIL) (2.5 MG/3ML) 0.083% nebulizer solution Take 2.5 mg by nebulization every 6 (six) hours as needed for wheezing or shortness of breath.  [name] amLODipine (NORVASC) 10 MG tablet Take 10 mg by mouth daily.   [name] arformoterol (BROVANA) 15 MCG/2ML NEBU Take 15 mcg by nebulization 2 (two) times daily as needed (difficulty breathing.).   [name] aspirin EC 81 MG tablet Take 81 mg by mouth daily.  . folic acid (FOLVITE) 1 MG tablet Take 1 mg by mouth daily.  . metFORMIN (GLUCOPHAGE) 500 MG tablet Take 500 mg by mouth 2 (two) times daily with a meal.   . nitroGLYCERIN (NITROSTAT) 0.4 MG SL tablet Place 1 tablet (0.4 mg total) under the tongue every 5 (five) minutes as needed for chest pain.  [name] omeprazole (PRILOSEC) 20 MG capsule Take 1 capsule (20 mg total) by mouth 2 (two) times daily before a meal. Please schedule appointment for refills.  . potassium chloride SA (K-DUR,KLOR-CON) 20 MEQ tablet Take 20 mEq by mouth daily as needed (weakness).   . rosuvastatin (CRESTOR) 40 MG tablet Take 40 mg by mouth daily.  . sildenafil (VIAGRA) 100 MG tablet Take 100 mg by mouth daily as needed for erectile dysfunction.  . SYMBICORT 160-4.5 MCG/ACT inhaler TAKE 2 PUFFS BY MOUTH TWICE A DAY  . tamsulosin (FLOMAX) 0.4 MG CAPS capsule Take 2 capsules (0.8 mg total) by mouth every evening. (Patient taking  differently: Take 0.4 mg by mouth daily after breakfast. )  . tiotropium (SPIRIVA HANDIHALER) 18 MCG inhalation capsule Place 1 capsule (18 mcg total) into inhaler and inhale daily. (Patient taking differently: Place 18 mcg into inhaler and inhale daily at 2 PM. )  . TOVIAZ 8 MG TB24 tablet Take 8 mg by mouth daily.   . Vitamin D, Ergocalciferol, (DRISDOL) 50000 units CAPS capsule Take 50,000 Units by mouth once a week.     No Known Allergies  Social History   Tobacco Use  . Smoking status: Former Smoker    Packs/day: 1.00    Years: 50.00    Pack years: 50.00    Types: Cigarettes    Last attempt to quit: 01/09/2008    Years since quitting: 10.3  . Smokeless tobacco: Never Used  . Tobacco comment: rolled own cigarettes since age of 54,   Substance Use Topics  . Alcohol use: Yes    Comment: occ  . Drug use: No   Social History   Social History Narrative   Married, 4 children.   Disability secondary to lung disease.    No known family history of premature CAD.    family history is not on file.  Wt Readings from Last 3 Encounters:  05/26/18 150 lb 3.2 oz (68.1 kg)  04/03/18 144 lb 11.2 oz (65.6 kg)  10/30/17 146 lb 6.4 oz (66.4 kg)    PHYSICAL EXAM Pulse 86   Temp 99.3 F (37.4 C)   Ht 5\' 4"  (1.626 m)   Wt 150 lb 3.2 oz (68.1 kg)   SpO2 95%   BMI 25.78 kg/m  Physical Exam  Constitutional: He is oriented to person, place, and time. He appears well-developed and well-nourished. No distress.  Mildly chronically ill appearing gentleman.  Wearing a mask.  Well-groomed.  HENT:  Head: Normocephalic and atraumatic.  Eyes: Pupils are equal, round, and reactive to light. EOM are normal.  Neck: Normal range of motion. Neck supple. No hepatojugular reflux and no JVD present.  Cardiovascular: Normal rate, regular rhythm  and intact distal pulses.  No extrasystoles are present. PMI is not displaced. Exam reveals no gallop and no friction rub.  Murmur heard.  Medium-pitched harsh  crescendo-decrescendo early systolic murmur is present with a grade of 1/6 at the upper right sternal border radiating to the neck. Pulmonary/Chest: Effort normal. He exhibits no tenderness.  Diminished breath sounds throughout, but no active wheezes rales or rhonchi.  Abdominal: Soft. Bowel sounds are normal.  He does have some mild lower quadrant tenderness on palpation, but otherwise soft, nontender nondistended.  Musculoskeletal: Normal range of motion.        General: No edema.  Neurological: He is alert and oriented to person, place, and time.  Skin: He is not diaphoretic.  Psychiatric: He has a normal mood and affect. His behavior is normal. Judgment and thought content normal.  Hard to assess because of language interpreter.  Vitals reviewed.    Adult ECG Report Not checked   Other studies Reviewed: Additional studies/ records that were reviewed today include:  Recent Labs:   Lab Results  Component Value Date   HGBA1C 6.6 (H) 04/03/2018   Lab Results  Component Value Date   CREATININE 1.09 04/03/2018   BUN 15 04/03/2018   NA 139 04/03/2018   K 3.7 04/03/2018   CL 102 04/03/2018   CO2 25 04/03/2018   Lab Results  Component Value Date   CHOL 171 08/05/2016   HDL 24 (L) 08/05/2016   LDLCALC 128 (H) 08/05/2016   TRIG 97 08/05/2016   CHOLHDL 7.1 08/05/2016    ASSESSMENT / PLAN: Problem List Items Addressed This Visit    CAD S/P BMS PCI - pRCA, mRCA, rPAV (Chronic)    Has now had bare-metal stent PCI to several places in the RCA and RPAV-PL -widely patent by last cath in December 2016.  No regional wall motion normality noted on echo July 2019.  He is not actively having angina symptoms.  He unfortunately has chronic exertional dyspnea, but does not feel that this is any different than it has been for the last couple years.  I do not think it is necessary to evaluate for ischemia at this point since symptoms seem to be relatively stable.  He remains on aspirin  alone without Plavix. Was beta-blocker intolerant in the past partially because of COPD but also because of bradycardia.  Is therefore on amlodipine for antianginal effect. Is on high-dose Crestor.      COPD with exacerbation (HCC) (Chronic)    He has chronic severe COPD and chronic exertional dyspnea.  Has not had recent exacerbation.  I do think that his most significant risk for surgery would be COPD and difficulty with ventilation etc. this is probably a higher risk than his cardiac issues.  He may benefit from evaluation by his pulmonologist prior to surgery.      Essential hypertension (Chronic)   History of ST elevation myocardial infarction (STEMI) of inferior wall (HCC) (Chronic)    Inferior STEMI back in February 2014.  Thankfully, no regional wall motion normality is noted on echo.  He had PCI to the mid RCA followed by recurrent anginal symptoms and noted to have significant disease in the proximal RCA as well as posterior lateral/PLV branch treated with bare-metal stent.  Those stents have been proven to be patent with to follow-up catheterizations.  In the absence of any CHF symptoms or recurrent angina, would not evaluate further at this time.      Hyperlipidemia associated with type  2 diabetes mellitus ([place]) (Chronic)    He does need fasting lipid panel checked based on his lipids in July 2018. Potentially this could be checked as part of his preop labs.  Will make labs as available for him. If not checked preop, would probably be best to be checked prior to being seen by [name] in follow-up.      Preop cardiovascular exam - Primary (Chronic)    Jonavon seems to be doing relatively well overall from a cardiac standpoint.  He has his baseline dyspnea from COPD (which he calls asthma).  I tried really hard to get an understanding of any progression of symptoms, and he really seems to feel as though this is stable dyspnea from his COPD.  Truthfully, I think his COPD is  probably the highest risk for his surgery.  PREOPERATIVE CARDIAC RISK ASSESSMENT   Revised Cardiac Risk Index:  High Risk Surgery: no; low risk  Defined as Intraperitoneal, intrathoracic or suprainguinal vascular  Active CAD: No; no active angina symptoms.  Has been revascularized with patent stents in follow-up  CHF: no; no active heart failure symptoms.  Euvolemic.  Normal EF by echo.  Cerebrovascular Disease: no; not documented.  Diabetes: yes; On Insulin: no  CKD (Cr >~ 2): no; 1.09  Total: 0, however COPD present Estimated Risk of Adverse Outcome: From a cardiac standpoint LOW RISK for an LOW RISK surgery Estimated Risk of MI, PE, VF/VT (Cardiac Arrest), Complete Heart Block: <1 %   ACC/AHA Guidelines for "Clearance":  Step 1 - Need for Emergency Surgery: No: Elective  If Yes - go straight to OR with perioperative surveillance  Step 2 - Active Cardiac Conditions (Unstable Angina, Decompensated HF, Significant  Arrhytmias - Complete HB, Mobitz II, Symptomatic VT or SVT, Severe Aortic Stenosis - mean gradient > 40 mmHg, Valve area < 1.0 cm2):   No: Has been had revascularization with no unstable angina or decompensated heart failure.  No arrhythmias.  Aortic sclerosis but no stenosis.  If Yes - Evaluate & Treat per ACC/AHA Guidelines  Step 3 -  Low Risk Surgery: Yes  If Yes --> proceed to OR  If No --> Step 4  Step 4 - Functional Capacity >= 4 METS without symptoms: Yes  If Yes --> proceed to OR  If No --> Step 5  Step 5 --  Clinical Risk Factors (CRF)   3 or more: No: 0 -see above  If Yes -- assess Surgical Risk, --   (High Risk Non-cardiac), Intraabdominal or thoracic vascular surgery consider testing if it will change management.  Intermediate Risk: Proceed to OR with HR control, or consider testing if it will change management If no CRFs  No CRFs: Yes  If Yes --> Proceed to OR--  Further cardiac testing would not change management at this time.   Would probably contact pulmonary medicine for preop evaluation as well. Okay to hold aspirin if necessary.      Type 2 diabetes mellitus with circulatory disorder (HCC) (Chronic)    He does have diabetes mellitus, but is taking oral medications and not on insulin.  A1c is pretty well controlled at 6.6. As such, this would not be considered to be a cardiovascular risk factor for surgery.         I spent a total of 28 minutes with the patient and chart review. >  50% of the time was spent in direct patient consultation.   Current medicines are reviewed at length with the  patient today.  (+/- concerns) none The following changes have been made:  None  Patient Instructions  Medication Instructions:  Your physician recommends that you continue on your current medications as directed. Please refer to the Current Medication list given to you today.  If you need a refill on your cardiac medications before your next appointment, please call your pharmacy.   Lab work:  --We will need fasting lipid panel and chemistry panel along with A1c (if not done as part of preop labs, can be done prior to six-month follow-up)  Testing/Procedures: NONE  Follow-Up: At [facility], you and your health needs are our priority.  As part of our continuing mission to provide you with exceptional heart care, we have created designated Provider Care Teams.  These Care Teams include your primary Cardiologist (physician) and Advanced Practice Providers (APPs -  Physician Assistants and Nurse Practitioners) who all work together to provide you with the care you need, when you need it. You will need a follow up appointment in 6 months.  Please call our office 2 months in advance to schedule this appointment.  You may see DR [name]  or one of the following Advanced Practice Providers on your designated Care Team: [name], [place] . Fabian Sharp, PA-C  Any Other Special Instructions Will Be Listed Below (If  Applicable). YOU ARE CLEARED FOR UPCOMING SURGERY    Studies Ordered:   No orders of the defined types were placed in this encounter.     [name] Hew, M.D., M.S. Interventional Cardiologist   Pager # [phone] Phone # [phone] [address] [place], [place] [address]   Thank you for choosing Heartcare at [facility]!!

## 2018-05-27 ENCOUNTER — Encounter: Payer: Self-pay | Admitting: Cardiology

## 2018-05-27 NOTE — Assessment & Plan Note (Signed)
He does need fasting lipid panel checked based on his lipids in July 2018. Potentially this could be checked as part of his preop labs.  Will make labs as available for him. If not checked preop, would probably be best to be checked prior to being seen by Dr. [name] in follow-up.

## 2018-05-27 NOTE — Assessment & Plan Note (Signed)
He has chronic severe COPD and chronic exertional dyspnea.  Has not had recent exacerbation.  I do think that his most significant risk for surgery would be COPD and difficulty with ventilation etc. this is probably a higher risk than his cardiac issues.  He may benefit from evaluation by his pulmonologist prior to surgery.

## 2018-05-27 NOTE — Assessment & Plan Note (Signed)
[name] seems to be doing relatively well overall from a cardiac standpoint.  He has his baseline dyspnea from COPD (which he calls asthma).  I tried really hard to get an understanding of any progression of symptoms, and he really seems to feel as though this is stable dyspnea from his COPD.  Truthfully, I think his COPD is probably the highest risk for his surgery.  PREOPERATIVE CARDIAC RISK ASSESSMENT   Revised Cardiac Risk Index:  High Risk Surgery: no; low risk  Defined as Intraperitoneal, intrathoracic or suprainguinal vascular  Active CAD: No; no active angina symptoms.  Has been revascularized with patent stents in follow-up  CHF: no; no active heart failure symptoms.  Euvolemic.  Normal EF by echo.  Cerebrovascular Disease: no; not documented.  Diabetes: yes; On Insulin: no  CKD (Cr >~ 2): no; 1.09  Total: 0, however COPD present Estimated Risk of Adverse Outcome: From a cardiac standpoint LOW RISK for an LOW RISK surgery Estimated Risk of MI, PE, VF/VT (Cardiac Arrest), Complete Heart Block: <1 %   ACC/AHA Guidelines for "Clearance":  Step 1 - Need for Emergency Surgery: No: Elective  If Yes - go straight to OR with perioperative surveillance  Step 2 - Active Cardiac Conditions (Unstable Angina, Decompensated HF, Significant  Arrhytmias - Complete HB, Mobitz II, Symptomatic VT or SVT, Severe Aortic Stenosis - mean gradient > 40 mmHg, Valve area < 1.0 cm2):   No: Has been had revascularization with no unstable angina or decompensated heart failure.  No arrhythmias.  Aortic sclerosis but no stenosis.  If Yes - Evaluate & Treat per ACC/AHA Guidelines  Step 3 -  Low Risk Surgery: Yes  If Yes --> proceed to OR  If No --> Step 4  Step 4 - Functional Capacity >= 4 METS without symptoms: Yes  If Yes --> proceed to OR  If No --> Step 5  Step 5 --  Clinical Risk Factors (CRF)   3 or more: No: 0 -see above  If Yes -- assess Surgical Risk, --   (High Risk  Non-cardiac), Intraabdominal or thoracic vascular surgery consider testing if it will change management.  Intermediate Risk: Proceed to OR with HR control, or consider testing if it will change management If no CRFs  No CRFs: Yes  If Yes --> Proceed to OR--  Further cardiac testing would not change management at this time.  Would probably contact pulmonary medicine for preop evaluation as well. Okay to hold aspirin if necessary.

## 2018-05-27 NOTE — Assessment & Plan Note (Signed)
Has now had bare-metal stent PCI to several places in the RCA and RPAV-PL -widely patent by last cath in December 2016.  No regional wall motion normality noted on echo July 2019.  He is not actively having angina symptoms.  He unfortunately has chronic exertional dyspnea, but does not feel that this is any different than it has been for the last couple years.  I do not think it is necessary to evaluate for ischemia at this point since symptoms seem to be relatively stable.  He remains on aspirin alone without Plavix. Was beta-blocker intolerant in the past partially because of COPD but also because of bradycardia.  Is therefore on amlodipine for antianginal effect. Is on high-dose Crestor.

## 2018-05-27 NOTE — Assessment & Plan Note (Signed)
Inferior STEMI back in February 2014.  Thankfully, no regional wall motion normality is noted on echo.  He had PCI to the mid RCA followed by recurrent anginal symptoms and noted to have significant disease in the proximal RCA as well as posterior lateral/PLV branch treated with bare-metal stent.  Those stents have been proven to be patent with to follow-up catheterizations.  In the absence of any CHF symptoms or recurrent angina, would not evaluate further at this time.

## 2018-05-27 NOTE — Assessment & Plan Note (Addendum)
He does have diabetes mellitus, but is taking oral medications and not on insulin.  A1c is pretty well controlled at 6.6. As such, this would not be considered to be a cardiovascular risk factor for surgery.

## 2018-05-28 ENCOUNTER — Other Ambulatory Visit: Payer: Self-pay | Admitting: Urology

## 2018-05-30 ENCOUNTER — Encounter (HOSPITAL_COMMUNITY): Payer: Self-pay

## 2018-05-30 NOTE — Patient Instructions (Addendum)
DUE TO COVID-19 NO VISITORS ARE ALLOWED IN THE HOSPITAL AT THIS TIME   COVID SWAB TESTING MUST BE COMPLETED ON: Today, Jun 03, 2018 at 1010AM   Your procedure is scheduled on: Friday, Jun 06, 2018   Surgery Time:  12:15PM-1:45PM   Report to [facility]  Entrance    Report to admitting at 10:15 AM   Call this number if you have problems the morning of surgery [phone]   Do not eat food or drink liquids :After Midnight.   Brush your teeth the morning of surgery.   Do NOT smoke after Midnight   Take these medicines the morning of surgery with A SIP OF WATER: Amlodipine, Omeprazole, Rosuvastatin, Tamsulosin, Toviaz   Use Asthma Inhalers per normal routine   Bring rescue Asthma Inhaler day of surgery  DO NOT TAKE ANY DIABETIC MEDICATIONS DAY OF YOUR SURGERY                               You may not have any metal on your body including hair pins, jewelry, and body piercings               Do not wear cologne, lotions, powders, perfumes/cologne, or deodorant                      Men may shave face and neck.   Do not bring valuables to the hospital. [place].   Contacts, dentures or bridgework may not be worn into surgery.    Patients discharged the day of surgery will not be allowed to drive home.   Name and phone number of your driver:Louis Carolan Shiver [phone]   Special Instructions:               Please read over the following fact sheets you were given:  [facility] - Preparing for Surgery Before surgery, you can play an important role.  Because skin is not sterile, your skin needs to be as free of germs as possible.  You can reduce the number of germs on your skin by washing with CHG (chlorahexidine gluconate) soap before surgery.  CHG is an antiseptic cleaner which kills germs and bonds with the skin to continue killing germs even after washing. Please DO NOT use if you have an allergy to CHG or  antibacterial soaps.  If your skin becomes reddened/irritated stop using the CHG and inform your nurse when you arrive at Short Stay. Do not shave (including legs and underarms) for at least 48 hours prior to the first CHG shower.  You may shave your face/neck.  Please follow these instructions carefully:  1.  Shower with CHG Soap the night before surgery and the  morning of surgery.  2.  If you choose to wash your hair, wash your hair first as usual with your normal  shampoo.  3.  After you shampoo, rinse your hair and body thoroughly to remove the shampoo.                             4.  Use CHG as you would any other liquid soap.  You can apply chg directly to the skin and wash.  Gently with a scrungie or clean washcloth.  5.  Apply the  CHG Soap to your body ONLY FROM THE NECK DOWN.   Do   not use on face/ open                           Wound or open sores. Avoid contact with eyes, ears mouth and   genitals (private parts).                       Wash face,  Genitals (private parts) with your normal soap.             6.  Wash thoroughly, paying special attention to the area where your    surgery  will be performed.  7.  Thoroughly rinse your body with warm water from the neck down.  8.  DO NOT shower/wash with your normal soap after using and rinsing off the CHG Soap.                9.  Pat yourself dry with a clean towel.            10.  Wear clean pajamas.            11.  Place clean sheets on your bed the night of your first shower and do not  sleep with pets. Day of Surgery : Do not apply any lotions/deodorants the morning of surgery.  Please wear clean clothes to the hospital/surgery center.  FAILURE TO FOLLOW THESE INSTRUCTIONS MAY RESULT IN THE CANCELLATION OF YOUR SURGERY  PATIENT SIGNATURE_________________________________  NURSE SIGNATURE__________________________________  ________________________________________________________________________

## 2018-05-30 NOTE — Pre-Procedure Instructions (Signed)
The following are in epic: Cardiac clearance and last office visit note Dr. Ellyn Hack 05/27/2018 EKG 04/03/2018 ECHO 07/27/2017 CXR 07/28/2017

## 2018-06-03 ENCOUNTER — Encounter: Payer: Self-pay | Admitting: Internal Medicine

## 2018-06-03 ENCOUNTER — Ambulatory Visit (INDEPENDENT_AMBULATORY_CARE_PROVIDER_SITE_OTHER): Payer: Medicare HMO

## 2018-06-03 ENCOUNTER — Other Ambulatory Visit: Payer: Self-pay

## 2018-06-03 ENCOUNTER — Ambulatory Visit (INDEPENDENT_AMBULATORY_CARE_PROVIDER_SITE_OTHER): Payer: Medicare HMO | Admitting: Internal Medicine

## 2018-06-03 ENCOUNTER — Encounter (HOSPITAL_COMMUNITY)
Admission: RE | Admit: 2018-06-03 | Discharge: 2018-06-03 | Disposition: A | Discharge status: Home / Self Care | Payer: Medicare HMO | Source: Ambulatory Visit | Attending: Urology | Admitting: Urology

## 2018-06-03 ENCOUNTER — Other Ambulatory Visit (HOSPITAL_COMMUNITY)
Admission: RE | Admit: 2018-06-03 | Discharge: 2018-06-03 | Disposition: A | Discharge status: Home / Self Care | Payer: Medicare HMO | Source: Ambulatory Visit | Attending: Urology | Admitting: Urology

## 2018-06-03 ENCOUNTER — Encounter (HOSPITAL_COMMUNITY): Payer: Self-pay

## 2018-06-03 VITALS — BP 160/80 | HR 99 | Temp 98.4°F | Ht 60.5 in | Wt 154.0 lb

## 2018-06-03 DIAGNOSIS — J449 Chronic obstructive pulmonary disease, unspecified: Secondary | ICD-10-CM

## 2018-06-03 DIAGNOSIS — I5030 Unspecified diastolic (congestive) heart failure: Secondary | ICD-10-CM | POA: Insufficient documentation

## 2018-06-03 DIAGNOSIS — Z955 Presence of coronary angioplasty implant and graft: Secondary | ICD-10-CM | POA: Insufficient documentation

## 2018-06-03 DIAGNOSIS — Z79899 Other long term (current) drug therapy: Secondary | ICD-10-CM | POA: Insufficient documentation

## 2018-06-03 DIAGNOSIS — I2510 Atherosclerotic heart disease of native coronary artery without angina pectoris: Secondary | ICD-10-CM | POA: Insufficient documentation

## 2018-06-03 DIAGNOSIS — E785 Hyperlipidemia, unspecified: Secondary | ICD-10-CM | POA: Insufficient documentation

## 2018-06-03 DIAGNOSIS — Z923 Personal history of irradiation: Secondary | ICD-10-CM | POA: Diagnosis not present

## 2018-06-03 DIAGNOSIS — I252 Old myocardial infarction: Secondary | ICD-10-CM | POA: Diagnosis not present

## 2018-06-03 DIAGNOSIS — C678 Malignant neoplasm of overlapping sites of bladder: Secondary | ICD-10-CM | POA: Diagnosis not present

## 2018-06-03 DIAGNOSIS — E119 Type 2 diabetes mellitus without complications: Secondary | ICD-10-CM | POA: Insufficient documentation

## 2018-06-03 DIAGNOSIS — N1330 Unspecified hydronephrosis: Secondary | ICD-10-CM | POA: Diagnosis not present

## 2018-06-03 DIAGNOSIS — Z01818 Encounter for other preprocedural examination: Secondary | ICD-10-CM | POA: Insufficient documentation

## 2018-06-03 DIAGNOSIS — Z87891 Personal history of nicotine dependence: Secondary | ICD-10-CM | POA: Insufficient documentation

## 2018-06-03 DIAGNOSIS — Z7951 Long term (current) use of inhaled steroids: Secondary | ICD-10-CM | POA: Insufficient documentation

## 2018-06-03 DIAGNOSIS — E7800 Pure hypercholesterolemia, unspecified: Secondary | ICD-10-CM | POA: Diagnosis not present

## 2018-06-03 DIAGNOSIS — Z1159 Encounter for screening for other viral diseases: Secondary | ICD-10-CM | POA: Insufficient documentation

## 2018-06-03 DIAGNOSIS — R918 Other nonspecific abnormal finding of lung field: Secondary | ICD-10-CM | POA: Diagnosis not present

## 2018-06-03 DIAGNOSIS — I110 Hypertensive heart disease with heart failure: Secondary | ICD-10-CM | POA: Insufficient documentation

## 2018-06-03 DIAGNOSIS — K219 Gastro-esophageal reflux disease without esophagitis: Secondary | ICD-10-CM | POA: Diagnosis not present

## 2018-06-03 DIAGNOSIS — Z7982 Long term (current) use of aspirin: Secondary | ICD-10-CM | POA: Insufficient documentation

## 2018-06-03 DIAGNOSIS — I10 Essential (primary) hypertension: Secondary | ICD-10-CM | POA: Diagnosis not present

## 2018-06-03 DIAGNOSIS — Z7984 Long term (current) use of oral hypoglycemic drugs: Secondary | ICD-10-CM | POA: Insufficient documentation

## 2018-06-03 DIAGNOSIS — Z9221 Personal history of antineoplastic chemotherapy: Secondary | ICD-10-CM | POA: Diagnosis not present

## 2018-06-03 DIAGNOSIS — C679 Malignant neoplasm of bladder, unspecified: Secondary | ICD-10-CM | POA: Insufficient documentation

## 2018-06-03 HISTORY — DX: Personal history of other diseases of the circulatory system: Z86.79

## 2018-06-03 LAB — BASIC METABOLIC PANEL
Anion gap: 7 (ref 5–15)
BUN: 14 mg/dL (ref 8–23)
CO2: 23 mmol/L (ref 22–32)
Calcium: 8.9 mg/dL (ref 8.9–10.3)
Chloride: 111 mmol/L (ref 98–111)
Creatinine, Ser: 1.52 mg/dL — ABNORMAL HIGH (ref 0.61–1.24)
GFR calc Af Amer: 52 mL/min — ABNORMAL LOW (ref >60)
GFR calc non Af Amer: 45 mL/min — ABNORMAL LOW (ref >60)
Glucose, Bld: 137 mg/dL — ABNORMAL HIGH (ref 70–99)
Potassium: 4.6 mmol/L (ref 3.5–5.1)
Sodium: 141 mmol/L (ref 135–145)

## 2018-06-03 LAB — CBC
HCT: 39.2 % (ref 39.0–52.0)
Hemoglobin: 12.7 g/dL — ABNORMAL LOW (ref 13.0–17.0)
MCH: 28.8 pg (ref 26.0–34.0)
MCHC: 32.4 g/dL (ref 30.0–36.0)
MCV: 88.9 fL (ref 80.0–100.0)
Platelets: 277 10*3/uL (ref 150–400)
RBC: 4.41 MIL/uL (ref 4.22–5.81)
RDW: 16 % — ABNORMAL HIGH (ref 11.5–15.5)
WBC: 9.4 10*3/uL (ref 4.0–10.5)
nRBC: 0 % (ref 0.0–0.2)

## 2018-06-03 LAB — GLUCOSE, CAPILLARY: Glucose-Capillary: 152 mg/dL — ABNORMAL HIGH (ref 70–99)

## 2018-06-03 MED ORDER — TIOTROPIUM BROMIDE MONOHYDRATE 2.5 MCG/ACT IN AERS: 2.0000 | INHALATION_SPRAY | Freq: Every day | RESPIRATORY_TRACT | 0 refills | Status: DC

## 2018-06-03 MED ORDER — TIOTROPIUM BROMIDE MONOHYDRATE 2.5 MCG/ACT IN AERS: INHALATION_SPRAY | RESPIRATORY_TRACT | 11 refills | Status: AC

## 2018-06-03 NOTE — Patient Instructions (Addendum)
Plan A = Automatic = symbicort 160 Take 2 puffs first thing in am and then another 2 puffs about 12 hours later and change spiriva to 2 pffs each am.  Stop brovana   Work on inhaler technique:  relax and gently blow all the way out then take a nice smooth deep breath back in, triggering the inhaler at same time you start breathing in.  Hold for up to 5 seconds if you can. Blow symbicort  out thru nose. Rinse and gargle with water when done   Plan B = Backup Only use your albuterol inhaler as a rescue medication to be used if you can't catch your breath by resting or doing a relaxed purse lip breathing pattern.  - The less you use it, the better it will work when you need it. - Ok to use the inhaler up to 2 puffs  every 4 hours if you must but call for appointment if use goes up over your usual need - Don't leave home without it !!  (think of it like the spare tire for your car)   Plan C = Crisis - only use your albuterol nebulizer if you first try Plan B and it fails to help > ok to use the nebulizer up to every 4 hours but if start needing it regularly call for immediate appointment   You are cleared for surgery pending cxr    Return to see Dr [place] Bells in 3 months    K? ho?ch A = Automatic = symbicort 160 L?y 2 nht ??u tin vo sng v sau ? thm 2 nht kho?ng 12 gi? sau ? v thay ??i t?o xo?n thnh 2 pff m?i sng. Ng?ng brovana  Lm vi?c v?i k? thu?t ?ng ht: th? gin v nh? nhng th?i ra sau ? ht m?t h?i th?t su m?n mng vo trong, kch ho?t ?ng ht cng lc b?n b?t ??u ht vo. Gi? ??n 5 giy n?u b?n c th?[name] Th?i symbicort ra qua m?i. R?a s?ch v Russell mi?ng v?i n??c   Gi B = Sao l?u Ch? s? d?ng ?ng ht albuterol c?a b?n nh? m?t lo?i thu?c c?u h? s? ???c s? d?ng n?u b?n khng th? th? ???c b?ng cch ngh? ng?i ho?c th?c hi?n m?t ki?u th? v mi tho?i mi. - B?n cng t s? d?ng n, n s? ho?t ??ng t?t h?n khi b?n c?n. - Ok ?? s? d?ng ?ng ht ln ??n 2 nht c? sau 4 gi? n?u b?n ph?i  nh?ng g?i cho cu?c h?n n?u s? d?ng v??t qu nhu c?u thng th??ng c?a b?n - ??ng r?i kh?i nh m khng c n !! (ngh? v? n gi?ng nh? l?p d? phng cho xe c?a b?n)  K? ho?ch C = Kh?ng ho?ng - ch? s? d?ng my phun s??ng albuterol c?a b?n n?u b?n l?n ??u dng th? K? ho?ch B v khng th? s? d?ng> ok ?? s? d?ng my phun s??ng t?i ?a c? sau 4 gi? nh?ng n?u b?t ??u c?n th??ng xuyn g?i cho cu?c h?n ngay l?p t?c   B?n ?ang b? xa ?? ph?u thu?t ch? cxr   Quay tr? l?i g?p bc s? McQuaid sau 3 thng

## 2018-06-03 NOTE — Progress Notes (Signed)
[name], male    DOB: 07-17-1945, 73 y.o.   MRN: [id number]   Brief patient profile:  73 yo [place]etnamese quit smoking 2010 with copd/ab  GOLD II criteria 2008 referred to pulmonary clinic 06/03/2018 by Dr   [name] for preop eval for bladder surgery.     History of Present Illness  06/03/2018  Pulmonary/ 1st office eval/Faylene Allerton re COPD/ abn CT/ preop clearance Dyspnea:  MMRC1 = can walk nl pace, flat grade, can't hurry or go uphills or steps s sob   Cough: better in warmer weather / worse in am's but min mucoid production  Sleep: bed is flat/ one pillow  SABA use: confused / does not know how/ when to use saba  02 none  Aecopd:  None recently    No obvious other patterns in day to day or daytime variability or assoc excess/ purulent sputum or mucus plugs or hemoptysis or cp or chest tightness, subjective wheeze or overt sinus or hb symptoms.   Sleeping flat as above without nocturnal  exacerbation  of respiratory  c/o's or need for noct saba. Also denies any obvious fluctuation of symptoms with weather or environmental changes or other aggravating or alleviating factors except as outlined above   No unusual exposure hx or h/o childhood pna/ asthma or knowledge of premature birth.  Current Allergies, Complete Past Medical History, Past Surgical History, Family History, and Social History were reviewed in [employer] record.  ROS  The following are not active complaints unless bolded Hoarseness, sore throat, dysphagia, dental problems, itching, sneezing,  nasal congestion or discharge of excess mucus or purulent secretions, ear ache,   fever, chills, sweats, unintended wt loss or wt gain, classically pleuritic or exertional cp,  orthopnea pnd or arm/hand swelling  or leg swelling, presyncope, palpitations, abdominal pain, anorexia, nausea, vomiting, diarrhea  or change in bowel habits or change in bladder habits, change in stools or change in urine, dysuria, hematuria,   rash, arthralgias, visual complaints, headache, numbness, weakness or ataxia or problems with walking or coordination,  change in mood or  memory.          Past Medical History:  Diagnosis Date  . Allergic rhinitis   . Aortic atherosclerosis ([place]) 08/03/2016   Noted on CXR  . CAD S/P BMS PCI (mid RCA, prox RCA & RPAV)    cardiologist-  Dr. [name]: a). NSTEMI/[place] 06/2009: Mod CAD & (-) Myoview; b). 02/2012: Inf STEMI s/p Veriflex BMS PCI (3.5 x 12 - 3.75 mm) -mid RCA. c). Exertional Angina / + MYOVIEW -->  BMS PCI x 2- prox RCA (Veriflex BMS 4.0 x 12), rPAV (3.0 x 12);; d) LAST CATH 12/2014: 40% ost LAD, 80% ostial D2, 50% midCx.  Patent RCA - RPAV/PL stents.  Medical Rx  . 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 Chest pain x 1 in last 6 months  . COPD, group C, by GOLD 2017 classification [facility]) pulmonologist-- dr mcquid   former smoker--- severe OSA w/ hx multiple hospital admissions w/ exacerbation's  . Dysuria   . Emphysema lung ([place]) 08/03/2016   Noted on CXR  . Essential hypertension   . Full dentures   . GERD (gastroesophageal reflux disease)   . Grade I diastolic dysfunction 18/84/1660   noted on ECHO  . Hematuria 04/02/2018  . History of cardiomegaly    Noted on CXR 07/28/2017  . History of non-ST elevation myocardial infarction (NSTEMI) 06/19/2009   in setting COPD w/ exacerbation;  negative  myoview for ischemia;  previously had cardiac cath 06-08-2009 w/ diffuse nonobtructive CAD and  normal LVF  . History of prostatitis   . History of ST elevation myocardial infarction (STEMI) 02/15/2012   in setting of COPD w/ exacerbation; per cardiac cath PCI and stenting to RCA  . Hyperlipidemia   . Lower abdominal pain   . LVH (left ventricular hypertrophy) 07/27/2017   Moderate, noted on ECHO  . Pneumonia 07/28/2017   Noted on CXR   . Pulmonary nodules 03/05/2018   Stable 4 mm left upper lobe nodule  . Recurrent malignant neoplasm of bladder [facility]) urologist-  dr [name]   previously dx  08/ 2018 and 10/ 2018 w/ high grade urothelial carcinoma with no muscle involvement  . Type 2 diabetes mellitus ([place])   . Wears glasses     Outpatient Medications Prior to Visit  Medication Sig Dispense Refill  . albuterol (PROVENTIL) (2.5 MG/3ML) 0.083% nebulizer solution Take 2.5 mg by nebulization every 6 (six) hours as needed for wheezing or shortness of breath.    [name] amLODipine (NORVASC) 10 MG tablet Take 10 mg by mouth daily.     [name] arformoterol (BROVANA) 15 MCG/2ML NEBU Take 15 mcg by nebulization 2 (two) times daily as needed (difficulty breathing.).     [name] aspirin EC 81 MG tablet Take 81 mg by mouth daily.    . folic acid (FOLVITE) 1 MG tablet Take 1 mg by mouth daily.    [name] ibuprofen (ADVIL) 200 MG tablet Take 400 mg by mouth daily as needed for headache or moderate pain.    [name] ipratropium (ATROVENT) 0.02 % nebulizer solution Take 0.5 mg by nebulization 2 (two) times daily as needed for wheezing or shortness of breath.    . meloxicam (MOBIC) 15 MG tablet Take 15 mg by mouth daily.    . metFORMIN (GLUCOPHAGE) 500 MG tablet Take 500 mg by mouth 2 (two) times daily with a meal.     . nitroGLYCERIN (NITROSTAT) 0.4 MG SL tablet Place 1 tablet (0.4 mg total) under the tongue every 5 (five) minutes as needed for chest pain. 25 tablet 3  . omeprazole (PRILOSEC) 20 MG capsule Take 1 capsule (20 mg total) by mouth 2 (two) times daily before a meal. Please schedule appointment for refills. 180 capsule 0  . rosuvastatin (CRESTOR) 40 MG tablet Take 40 mg by mouth daily.    . SYMBICORT 160-4.5 MCG/ACT inhaler Inhale 2 puffs into the lungs 2 (two) times a day.   5  . tamsulosin (FLOMAX) 0.4 MG CAPS capsule Take 2 capsules (0.8 mg total) by mouth every evening. (Patient taking differently: Take 0.4 mg by mouth daily after breakfast. ) 30 capsule 2  . tiotropium (SPIRIVA HANDIHALER) 18 MCG inhalation capsule Place 1 capsule (18 mcg total) into inhaler and inhale daily. (Patient taking differently: Place 18  mcg into inhaler and inhale daily at 2 PM. ) 30 capsule 1  . TOVIAZ 8 MG TB24 tablet Take 8 mg by mouth daily.     . Vitamin D, Ergocalciferol, (DRISDOL) 50000 units CAPS capsule Take 50,000 Units by mouth once a week.   5  . albuterol (PROVENTIL HFA) 108 (90 BASE) MCG/ACT inhaler Inhale 2 puffs into the lungs 2 (two) times daily. (Patient taking differently: Inhale 2 puffs into the lungs every 6 (six) hours as needed for wheezing or shortness of breath. ) 1 Inhaler 1  . guaiFENesin (MUCUS RELIEF ADULT PO) Take 1 tablet by mouth daily as needed (  congestion).    . potassium chloride SA (K-DUR,KLOR-CON) 20 MEQ tablet Take 20 mEq by mouth daily as needed (weakness).         Objective:     BP (!) 160/80 (BP Location: Left Arm, Cuff Size: Normal)   Pulse 99   Temp 98.4 F (36.9 C) (Oral)   Ht 5' 0.5" (1.537 m)   Wt 154 lb (69.9 kg)   SpO2 97%   BMI 29.58 kg/m     Vital signs reviewed - Note on arrival 02 sats  97% on RA     HEENT:Edentulous/ nl oropharynx. Nl external ear canals without cough reflex -  Mild bilateral non-specific turbinate edema     NECK :  without JVD/Nodes/TM/ nl carotid upstrokes bilaterally   LUNGS: no acc muscle use,  Mild barrel  contour chest wall with bilateral  Distant bs s audible wheeze and  without cough on insp or exp maneuver and mild  Hyperresonant  to  percussion bilaterally     CV:  RRR  no s3 or murmur or increase in P2, and no edema   ABD:  soft and nontender with pos late  insp Hoover's  in the supine position. No bruits or organomegaly appreciated, bowel sounds nl  MS:   Nl gait/  ext warm without deformities, calf tenderness, cyanosis or clubbing No obvious joint restrictions   SKIN: warm and dry without lesions    NEURO:  alert, approp, nl sensorium with  no motor or cerebellar deficits apparent.         CXR PA and Lateral:   06/03/2018 :    I personally reviewed images and agree with radiology impression as follows:   No  radiographic evidence of acute cardiopulmonary disease.     Labs o  reviewed:      Chemistry      Component Value Date/Time   NA 141 06/03/2018 1010   K 4.6 06/03/2018 1010   CL 111 06/03/2018 1010   CO2 23 06/03/2018 1010   BUN 14 06/03/2018 1010   CREATININE 1.52 (H) 06/03/2018 1010   CREATININE 0.96 03/05/2018 0838   CREATININE 0.75 04/08/2015 1049      Component Value Date/Time   CALCIUM 8.9 06/03/2018 1010   ALKPHOS 69 03/05/2018 0838   AST 22 03/05/2018 0838   ALT 21 03/05/2018 0838   BILITOT 0.5 03/05/2018 0838        Lab Results  Component Value Date   WBC 9.4 06/03/2018   HGB 12.7 (L) 06/03/2018   HCT 39.2 06/03/2018   MCV 88.9 06/03/2018   PLT 277 06/03/2018     Covid 19 PCR 06/03/2018 neg      I personally reviewed images and agree with radiology impression as follows:   Chest CT w contrast 03/05/2018 1. Circumferential bladder wall thickening including a more focal irregular wall thickening in the posterior bladder extending into the right UVJ. There is some focal hyper enhancement in this region concerning for recurrent disease. These changes also result in mild right hydroureteronephrosis. 2. No evidence for metastatic disease in the chest, abdomen or pelvis. 3. 8 mm posterior left upper lobe pulmonary nodule seen on the previous study has resolved in the interval. 4. Similar basilar tree-in-bud opacity with interstitial disease in the right lung base, suggesting sequelae of atypical infection. 5. Stable 4 mm left upper lobe nodule. Attention on follow-up recommended. 6. Stable 8 mm low-density lesion in the body of pancreas. Continued attention on follow-up imaging recommended.  7. Aortic Atherosclerois (ICD10-170.0) 8.  Emphysema. ([id number])    Assessment   COPD GOLD II  Quit smoking 2010 Spirometry May 15 2006 FEV1 1.07 (63%)  Ratio 0.39 classic f/v  - 06/03/2018  After extensive coaching inhaler device,  effectiveness =    90% with smi   From baseline near 0    Considering this patient had no clue whatsoever as to how and when to use his medications he is actually doing fairly well without limiting dyspnea nor tendency to exacerbation at this point and so it is difficult to know what he needs long-term but for the short-term would recommend he be maintained on Labe lama and ICS as per the AVS instructions.  I did confirm that he can read [name] and so gave him instructions in [name] to try to help him understand how and when to use his various inhalers and have cleared him for bladder surgery at this point..    Pulmonary nodules See CT chest 03/05/18  Largest 4 mm  CT results reviewed with pt >>> Too small for PET or bx, not suspicious enough for excisional bx > really only option for now is follow the Fleischner society guidelines as rec by radiology> no indication for aggressive follow-up for nodule so small and note the previous 8 mm nodule has resolved typical of an inflammatory process which is also suggested by the tree-in-bud changes which are typically of no consequence in this setting.  Discussed in detail all the  indications, usual  risks and alternatives  relative to the benefits with patient who agrees to proceed with conservative f/u ie only for change in symptoms or surveillance for metastatic urologic cancer.      Total time devoted to counseling  > 50 % of initial 60 min office visit:  review case with pt via interpret(virtual)  discussion of options/alternatives/ personally creating written customized instructions  in presence of pt  then going over those specific  Instructions directly with the pt including how to use all of the meds but in particular covering each new medication in detail and the difference between the maintenance= "automatic" meds and the prns using an action plan format for the latter (If this problem/symptom => do that organization reading Left to right).  Please see AVS from this  visit for a full list of these instructions which I personally wrote for this pt and  are unique to this visit.    Christinia Gully, MD 06/03/2018

## 2018-06-04 ENCOUNTER — Encounter: Payer: Self-pay | Admitting: Internal Medicine

## 2018-06-04 DIAGNOSIS — R918 Other nonspecific abnormal finding of lung field: Secondary | ICD-10-CM | POA: Insufficient documentation

## 2018-06-04 LAB — NOVEL CORONAVIRUS, NAA (HOSP ORDER, SEND-OUT TO REF LAB; TAT 18-24 HRS): SARS-CoV-2, NAA: NOT DETECTED

## 2018-06-04 LAB — HEMOGLOBIN A1C
Hgb A1c MFr Bld: 6.1 % — ABNORMAL HIGH (ref 4.8–5.6)
Mean Plasma Glucose: 128 mg/dL

## 2018-06-04 NOTE — Anesthesia Preprocedure Evaluation (Addendum)
Anesthesia Evaluation    Airway Mallampati: II  TM Distance: >3 FB Neck ROM: Full    Dental no notable dental hx.    Pulmonary COPD, former smoker,    Pulmonary exam normal breath sounds clear to auscultation       Cardiovascular hypertension, Pt. on medications + CAD and + Past MI  Normal cardiovascular exam Rhythm:Regular Rate:Normal     Neuro/Psych    GI/Hepatic   Endo/Other  diabetes, Type 2  Renal/GU      Musculoskeletal   Abdominal   Peds  Hematology   Anesthesia Other Findings   Reproductive/Obstetrics                                                              Anesthesia Evaluation  Patient identified by MRN, date of birth, ID band Patient awake    Reviewed: Allergy & Precautions, NPO status , Patient's Chart, lab work & pertinent test results  Airway Mallampati: II  TM Distance: >3 FB Neck ROM: Full    Dental no notable dental hx.    Pulmonary COPD, former smoker,    Pulmonary exam normal breath sounds clear to auscultation       Cardiovascular hypertension, + CAD  Normal cardiovascular exam Rhythm:Regular Rate:Normal     Neuro/Psych negative neurological ROS     GI/Hepatic negative GI ROS,   Endo/Other  diabetes, Type 2  Renal/GU      Musculoskeletal   Abdominal   Peds  Hematology   Anesthesia Other Findings   Reproductive/Obstetrics                            Lab Results  Component Value Date   WBC 9.4 06/03/2018   HGB 12.7 (L) 06/03/2018   HCT 39.2 06/03/2018   MCV 88.9 06/03/2018   PLT 277 06/03/2018    Anesthesia Physical Anesthesia Plan  ASA: III  Anesthesia Plan: General   Post-op Pain Management:    Induction: Intravenous  PONV Risk Score and Plan: 2 and Treatment may vary due to age or medical condition and Ondansetron  Airway Management Planned: LMA  Additional Equipment:   Intra-op  Plan:   Post-operative Plan:   Informed Consent: I have reviewed the patients History and Physical, chart, labs and discussed the procedure including the risks, benefits and alternatives for the proposed anesthesia with the patient or authorized representative who has indicated his/her understanding and acceptance.   Dental advisory given  Plan Discussed with: CRNA  Anesthesia Plan Comments:         Anesthesia Quick Evaluation  Anesthesia Physical Anesthesia Plan  ASA: III  Anesthesia Plan: General   Post-op Pain Management:    Induction: Intravenous  PONV Risk Score and Plan: 2 and Ondansetron and Treatment may vary due to age or medical condition  Airway Management Planned: Oral ETT  Additional Equipment:   Intra-op Plan:   Post-operative Plan: Extubation in OR  Informed Consent: I have reviewed the patients History and Physical, chart, labs and discussed the procedure including the risks, benefits and alternatives for the proposed anesthesia with the patient or authorized representative who has indicated his/her understanding and acceptance.     Dental advisory given  Plan Discussed with: CRNA  Anesthesia Plan  Comments: (See PAT note 06/03/2018, Konrad Felix, PA-C)      Anesthesia Quick Evaluation

## 2018-06-04 NOTE — Progress Notes (Signed)
lmtcb through interpreter

## 2018-06-04 NOTE — Assessment & Plan Note (Signed)
Quit smoking 2010 Spirometry May 15 2006 FEV1 1.07 (63%)  Ratio 0.39 classic f/v  - 06/03/2018  After extensive coaching inhaler device,  effectiveness =    90% with smi  From baseline near 0    Considering this patient had no clue whatsoever as to how and when to use his medications he is actually doing fairly well without limiting dyspnea nor tendency to exacerbation at this point and so it is difficult to know what he needs long-term but for the short-term would recommend he be maintained on Labe lama and ICS as per the AVS instructions.  I did confirm that he can read [name] and so gave him instructions in [name] to try to help him understand how and when to use his various inhalers and have cleared him for bladder surgery at this point..   Total time devoted to counseling  > 50 % of initial 60 min office visit:  review case with pt via interpret(virtual)  discussion of options/alternatives/ personally creating written customized instructions  in presence of pt  then going over those specific  Instructions directly with the pt including how to use all of the meds but in particular covering each new medication in detail and the difference between the maintenance= "automatic" meds and the prns using an action plan format for the latter (If this problem/symptom => do that organization reading Left to right).  Please see AVS from this visit for a full list of these instructions which I personally wrote for this pt and  are unique to this visit.

## 2018-06-04 NOTE — Progress Notes (Signed)
Anesthesia Chart Review   Case:  [id number] Date/Time:  06/06/18 1200   Procedures:      TRANSURETHRAL RESECTION OF BLADDER TUMOR (TURBT) (N/A )     CYSTOSCOPY WITH URETEROSCOPY BILATERAL RETROGRADE PYELOGRAM (Bilateral )   Anesthesia type:  General   Pre-op diagnosis:  BLADDER CANCER   Location:  [place] [facility]   Surgeon:  [name], MD      DISCUSSION:72 yo former smoker (50 pack years, quit 01/09/08) with h/o GERD, HLD, severe COPD GOLD II, CAD (NSTEMI, BMS 9211), diastolic heart failure, HTN, DM II, bladder cancer scheduled for above procedure 06/06/2018 with Dr. [name].   Pt last seen by cardiologist, Dr. Glenetta Hew, 05/26/2018.  Per his note, "[name] seems to be doing relatively well overall from a cardiac standpoint.  He has his baseline dyspnea from COPD (which he calls asthma).  I tried really hard to get an understanding of any progression of symptoms, and he really seems to feel as though this is stable dyspnea from his COPD.  Truthfully, I think his COPD is probably the highest risk for his surgery.  Further cardiac testing would not change management at this time. From a cardiac standpoint LOW RISK for an LOW RISK surgery.  Estimated Risk of MI, PE, VF/VT (Cardiac Arrest), Complete Heart Block: <1 % Okay to hold aspirin if necessary."  Pt seen by pulmonologist, Dr. Christinia Gully, 05/27/18.  Per his note, "Considering this patient had no clue whatsoever as to how and when to use his medications he is actually doing fairly well without limiting dyspnea nor tendency to exacerbation at this point and so it is difficult to know what he needs long-term but for the short-term would recommend he be maintained on Labe lama and ICS as per the AVS instructions.  I did confirm that he can read [name] and so gave him instructions in [name] to try to help him understand how and when t o use his various inhalers and have cleared him for bladder surgery at this  point."  Pt can proceed with planned procedure barring acute status change.  VS: BP (!) 162/72   Pulse 80   Temp 37.2 C (Oral)   Resp 18   Ht 5\' 4"  (1.626 m)   Wt 68 kg   SpO2 98%   BMI 25.75 kg/m   PROVIDERS: [name], MD is PCP last seen 11/22/17  Glenetta Hew, MD is Cardiologist  Christinia Gully, MD is Pulmonologist  LABS: Labs reviewed: Acceptable for surgery. (all labs ordered are listed, but only abnormal results are displayed)  Labs Reviewed  HEMOGLOBIN A1C - Abnormal; Notable for the following components:      Result Value   Hgb A1c MFr Bld 6.1 (*)    All other components within normal limits  BASIC METABOLIC PANEL - Abnormal; Notable for the following components:   Glucose, Bld 137 (*)    Creatinine, Ser 1.52 (*)    GFR calc non Af Amer 45 (*)    GFR calc Af Amer 52 (*)    All other components within normal limits  CBC - Abnormal; Notable for the following components:   Hemoglobin 12.7 (*)    RDW 16.0 (*)    All other components within normal limits  GLUCOSE, CAPILLARY - Abnormal; Notable for the following components:   Glucose-Capillary 152 (*)    All other components within normal limits     IMAGES: Chest Xray 06/03/2018 FINDINGS: Lung volumes  are normal. No consolidative airspace disease. No pleural effusions. No pneumothorax. No pulmonary nodule or mass noted. Pulmonary vasculature and the cardiomediastinal silhouette are within normal limits.  IMPRESSION: No radiographic evidence of acute cardiopulmonary disease.  EKG: 04/03/2018 Rate 87 bpm Normal sinus rhythm Normal ECG No significant change since last tracing  CV: Echo 07/27/17 Study Conclusions  - Left ventricle: Wall thickness was increased in a pattern of   moderate LVH. Systolic function was normal. The estimated   ejection fraction was in the range of 50% to 55%. Doppler   parameters are consistent with abnormal left ventricular   relaxation (grade 1 diastolic  dysfunction). - Aortic valve: Sclerosis without stenosis. - Aorta: There was mild dilation of the sinus(es) of Valsalva.   Aortic root dimension: 41 mm (ED). - Mitral valve: There was trivial regurgitation. - Right ventricle: Systolic function was normal. - Tricuspid valve: There was trivial regurgitation. - Pulmonic valve: There was no significant regurgitation. - Pericardium, extracardiac: A trivial pericardial effusion was   identified.  Impressions:  - Unchanged from prior study.  Cardiac Cath 12/27/14  Ost LAD to Prox LAD lesion, 40% stenosed.  Ost 2nd Diag to 2nd Diag lesion, 80% stenosed.  Mid Cx lesion, 50% stenosed.  The left ventricular systolic function is normal.  Patent stents in the RCA.  Mild to moderate left sided disease.   Continue aggressive secondary prevention.  He will be watched for bleeding at his radial site.  He will then go back to [facility] with further w/u per the internal medicine team. Past Medical History:  Diagnosis Date  . Allergic rhinitis   . Aortic atherosclerosis ([place]) 08/03/2016   Noted on CXR  . CAD S/P BMS PCI (mid RCA, prox RCA & RPAV)    cardiologist-  Dr. [name]: a). NSTEMI/[place] 06/2009: Mod CAD & (-) Myoview; b). 02/2012: Inf STEMI s/p Veriflex BMS PCI (3.5 x 12 - 3.75 mm) -mid RCA. c). Exertional Angina / + MYOVIEW -->  BMS PCI x 2- prox RCA (Veriflex BMS 4.0 x 12), rPAV (3.0 x 12);; d) LAST CATH 12/2014: 40% ost LAD, 80% ostial D2, 50% midCx.  Patent RCA - RPAV/PL stents.  Medical Rx  . Centriacinar emphysema ([place])   . Chest pain x 1 in last 6 months  . COPD, group C, by GOLD 2017 classification [facility]) pulmonologist-- dr mcquid   former smoker--- severe OSA w/ hx multiple hospital admissions w/ exacerbation's  . Dysuria   . Emphysema lung ([place]) 08/03/2016   Noted on CXR  . Essential hypertension   . Full dentures   . GERD (gastroesophageal reflux disease)   . Grade I diastolic dysfunction 11/91/4782   noted on ECHO  .  Hematuria 04/02/2018  . History of cardiomegaly    Noted on CXR 07/28/2017  . History of non-ST elevation myocardial infarction (NSTEMI) 06/19/2009   in setting COPD w/ exacerbation;  negative myoview for ischemia;  previously had cardiac cath 06-08-2009 w/ diffuse nonobtructive CAD and  normal LVF  . History of prostatitis   . History of ST elevation myocardial infarction (STEMI) 02/15/2012   in setting of COPD w/ exacerbation; per cardiac cath PCI and stenting to RCA  . Hyperlipidemia   . Lower abdominal pain   . LVH (left ventricular hypertrophy) 07/27/2017   Moderate, noted on ECHO  . Pneumonia 07/28/2017   Noted on CXR   . Pulmonary nodules 03/05/2018   Stable 4 mm left upper lobe nodule  . 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 Type 2 diabetes mellitus ([place])   . Wears glasses     Past Surgical History:  Procedure Laterality Date  . CARDIAC CATHETERIZATION N/A 12/27/2014   Procedure: Left Heart Cath and Coronary Angiography;  Surgeon: Jettie Booze, MD;  Location: [place] CV LAB;  Service: Cardiovascular;  Laterality: N/A; Ost-prox LAD 40%, ost D2 (small) 80%, mCFx 50%, mild to moderate left sided disease, patent RCA (p, m & RPAV) stents, normal LVF  . COLONOSCOPY    . CYSTOSCOPY W/ RETROGRADES Bilateral 09/07/2016   Procedure: CYSTOSCOPY WITH BILATERAL RETROGRADE PYELOGRAM;  Surgeon: [name], MD;  Location: [facility];  Service: Urology;  Laterality: Bilateral;  . CYSTOSCOPY W/ RETROGRADES Bilateral 03/15/2017   Procedure: CYSTOSCOPY WITH RETROGRADE PYELOGRAM;  Surgeon: [name], MD;  Location: [facility];  Service: Urology;  Laterality: Bilateral;  . CYSTOSCOPY WITH RETROGRADE PYELOGRAM, URETEROSCOPY AND STENT PLACEMENT Left 10/19/2016   Procedure: CYSTOSCOPY WITH RETROGRADE PYELOGRAM, URETEROSCOPY AND  STENT PLACEMENT;  Surgeon: [name], MD;  Location: [facility];  Service: Urology;  Laterality: Left;  . ESOPHAGOGASTRODUODENOSCOPY  2004  . LEFT HEART CATH AND CORONARY ANGIOGRAPHY  06/08/2009   Dr. Burt Knack: LAD 40-50%, LCX 40%, RCA 25% EF 65%m -thought to be related to coronary spasm.  [name] LEFT HEART CATHETERIZATION WITH CORONARY ANGIOGRAM N/A 02/15/2012   Procedure: LEFT HEART CATHETERIZATION WITH CORONARY ANGIOGRAM;  Surgeon: Peter M [name], MD;  Location: [facility] CATH LAB;  Service: Cardiovascular; inferior STEMI:  mid RCA 99% (BMS PCI), normal LVF, diffuse 20-30% distal RCA and 50% RPL/PAB.  Mid LCx 50-60%.  Mid LAD 30-40% at D1 with ostial D1 50%  . LEFT HEART CATHETERIZATION WITH CORONARY ANGIOGRAM N/A 07/01/2012   Procedure: LEFT HEART CATHETERIZATION WITH CORONARY ANGIOGRAM;  Surgeon: Peter M [name], MD;  Location: [facility] CATH LAB;  Service: Cardiovascular:: CLASS III ANGINA & + MYOVIEW (inf Ischemia) -- pRCA RCA 90% (BMS PCI), widely patent mid RCA BMS & diffuse 30-40% dist RCA, RPAV focal 80% (BMS PCI).  30% P-M LAD.  20% circumflex marginal.  EF 55-65%.  [name] LEFT HEART CATHETERIZATION WITH CORONARY ANGIOGRAM N/A 08/03/2013   Procedure: LEFT HEART CATHETERIZATION WITH CORONARY ANGIOGRAM;  Surgeon: Peter M [name], MD;  Location: [facility] CATH LAB;  Service: Cardiovascular;  Laterality: N/A;  nonobstuctive CAD, --  Mild LAD (mid 40%).  Mild circumflex.  Patent mid RCA and PAB stents with mild diffuse 30% disease elsewhere (~30% RPAV ISR).  Normal LV function..   . NM MYOVIEW LTD  06/25/2012   Dr. [name]: INTERMEDIATE RISK - w/ medium-sized, mild primary reversible basal to mid inferior perfusion defect suggestive of ischemia/  normal LV function and wall motion ,ef 63% --> lead to CATH & BMS PCI of  pRCA & RPAV.  [name] PERCUTANEOUS CORONARY STENT INTERVENTION (PCI-S) N/A 02/15/2012   Procedure: PERCUTANEOUS CORONARY STENT INTERVENTION (PCI-S);  Surgeon: Peter M [name], MD;  Location: [facility] CATH LAB;  Service:  Cardiovascular:: mRCA 99% -- Veriflex BMS 3.0 x 12 (3.75 mm) -insetting of inferior STEMI  . PERCUTANEOUS CORONARY STENT INTERVENTION (PCI-S)  07/01/2012   Procedure: PERCUTANEOUS CORONARY STENT INTERVENTION (PCI-S);  Surgeon: Peter M [name], MD;  Location: [facility] CATH LAB;  Service: Cardiovascular;;  pRCA PCI - Veriflex BMS 4.0 x 12; RPAV PCI - Veriflex BMS 3.0 x 12   . stents  to heart x 2    . TRANSTHORACIC ECHOCARDIOGRAM  07/2017   Mod LVH, EF 50-55%, Gr 1 DD. mild PR and TR, trivial pericardial effusion (no change from 07/2016)  . TRANSURETHRAL RESECTION OF BLADDER TUMOR N/A 09/07/2016   Procedure: TRANSURETHRAL RESECTION OF BLADDER TUMOR (TURBT);  Surgeon: [name], MD;  Location: [facility];  Service: Urology;  Laterality: N/A;  . TRANSURETHRAL RESECTION OF BLADDER TUMOR N/A 10/19/2016   Procedure: TRANSURETHRAL RESECTION OF BLADDER TUMOR (TURBT);  Surgeon: [name], MD;  Location: [facility];  Service: Urology;  Laterality: N/A;  . TRANSURETHRAL RESECTION OF BLADDER TUMOR N/A 03/15/2017   Procedure: TRANSURETHRAL RESECTION OF BLADDER TUMOR (TURBT);  Surgeon: [name], MD;  Location: [facility];  Service: Urology;  Laterality: N/A;    MEDICATIONS: . albuterol (PROVENTIL HFA) 108 (90 BASE) MCG/ACT inhaler  . albuterol (PROVENTIL) (2.5 MG/3ML) 0.083% nebulizer solution  . amLODipine (NORVASC) 10 MG tablet  . aspirin EC 81 MG tablet  . folic acid (FOLVITE) 1 MG tablet  . ibuprofen (ADVIL) 200 MG tablet  . meloxicam (MOBIC) 15 MG tablet  . metFORMIN (GLUCOPHAGE) 500 MG tablet  . nitroGLYCERIN (NITROSTAT) 0.4 MG SL tablet  . omeprazole (PRILOSEC) 20 MG capsule  . rosuvastatin (CRESTOR) 40 MG tablet  . SYMBICORT 160-4.5 MCG/ACT inhaler  . tamsulosin (FLOMAX) 0.4 MG CAPS capsule  . Tiotropium Bromide Monohydrate (SPIRIVA RESPIMAT) 2.5 MCG/ACT AERS  . TOVIAZ 8 MG TB24 tablet  . Vitamin D, Ergocalciferol, (DRISDOL) 50000 units CAPS capsule   No current  facility-administered medications for this encounter.     Maia Plan [facility] Pre-Surgical Testing [phone] 06/04/18 9:28 AM

## 2018-06-04 NOTE — Assessment & Plan Note (Signed)
See CT chest 03/05/18  Largest 4 mm  CT results reviewed with pt >>> Too small for PET or bx, not suspicious enough for excisional bx > really only option for now is follow the Fleischner society guidelines as rec by radiology> no indication for aggressive follow-up for nodule so small and note the previous 8 mm nodule has resolved typical of an inflammatory process which is also suggested by the tree-in-bud changes which are typically of no consequence in this setting.  Discussed in detail all the  indications, usual  risks and alternatives  relative to the benefits with patient who agrees to proceed with conservative f/u ie only for change in symptoms or surveillance for metastatic urologic cancer.

## 2018-06-05 NOTE — Progress Notes (Signed)
Message left with patient's daughter, Cyd Silence. She is to call back if patient has any symptoms of Covid 19    SPOKE W/       SCREENING SYMPTOMS OF COVID 19:   COUGH--  RUNNY NOSE---   SORE THROAT---  NASAL CONGESTION----  SNEEZING----  SHORTNESS OF BREATH---  DIFFICULTY BREATHING---  TEMP >100.0 -----  UNEXPLAINED BODY ACHES------  CHILLS --------   HEADACHES ---------  LOSS OF SMELL/ TASTE --------    HAVE YOU OR ANY FAMILY MEMBER TRAVELLED PAST 14 DAYS OUT OF THE   COUNTY--- STATE---- COUNTRY----  HAVE YOU OR ANY FAMILY MEMBER BEEN EXPOSED TO ANYONE WITH COVID 19?

## 2018-06-06 ENCOUNTER — Ambulatory Visit (HOSPITAL_COMMUNITY): Payer: Medicare HMO | Admitting: Physician Assistant

## 2018-06-06 ENCOUNTER — Encounter (HOSPITAL_COMMUNITY): Payer: Self-pay

## 2018-06-06 ENCOUNTER — Encounter (HOSPITAL_COMMUNITY)
Admission: RE | Disposition: A | Discharge status: Home / Self Care | Payer: Self-pay | Source: Home / Self Care | Attending: Urology

## 2018-06-06 ENCOUNTER — Observation Stay (HOSPITAL_COMMUNITY)
Admission: RE | Admit: 2018-06-06 | Discharge: 2018-06-07 | Disposition: A | Discharge status: Home / Self Care | Payer: Medicare HMO | Attending: Urology | Admitting: Urology

## 2018-06-06 ENCOUNTER — Observation Stay (HOSPITAL_COMMUNITY): Payer: Medicare HMO

## 2018-06-06 ENCOUNTER — Ambulatory Visit (HOSPITAL_COMMUNITY): Payer: Medicare HMO | Admitting: Certified Registered Nurse Anesthetist

## 2018-06-06 ENCOUNTER — Ambulatory Visit (HOSPITAL_COMMUNITY): Payer: Medicare HMO

## 2018-06-06 ENCOUNTER — Other Ambulatory Visit: Payer: Self-pay

## 2018-06-06 DIAGNOSIS — E119 Type 2 diabetes mellitus without complications: Secondary | ICD-10-CM | POA: Insufficient documentation

## 2018-06-06 DIAGNOSIS — J449 Chronic obstructive pulmonary disease, unspecified: Secondary | ICD-10-CM | POA: Insufficient documentation

## 2018-06-06 DIAGNOSIS — C679 Malignant neoplasm of bladder, unspecified: Secondary | ICD-10-CM | POA: Diagnosis present

## 2018-06-06 DIAGNOSIS — E7800 Pure hypercholesterolemia, unspecified: Secondary | ICD-10-CM | POA: Insufficient documentation

## 2018-06-06 DIAGNOSIS — Z1159 Encounter for screening for other viral diseases: Secondary | ICD-10-CM | POA: Diagnosis not present

## 2018-06-06 DIAGNOSIS — N1330 Unspecified hydronephrosis: Secondary | ICD-10-CM | POA: Diagnosis not present

## 2018-06-06 DIAGNOSIS — Z923 Personal history of irradiation: Secondary | ICD-10-CM | POA: Insufficient documentation

## 2018-06-06 DIAGNOSIS — Z7982 Long term (current) use of aspirin: Secondary | ICD-10-CM | POA: Insufficient documentation

## 2018-06-06 DIAGNOSIS — Z79899 Other long term (current) drug therapy: Secondary | ICD-10-CM | POA: Insufficient documentation

## 2018-06-06 DIAGNOSIS — I2510 Atherosclerotic heart disease of native coronary artery without angina pectoris: Secondary | ICD-10-CM | POA: Insufficient documentation

## 2018-06-06 DIAGNOSIS — Z7951 Long term (current) use of inhaled steroids: Secondary | ICD-10-CM | POA: Insufficient documentation

## 2018-06-06 DIAGNOSIS — K219 Gastro-esophageal reflux disease without esophagitis: Secondary | ICD-10-CM | POA: Insufficient documentation

## 2018-06-06 DIAGNOSIS — I10 Essential (primary) hypertension: Secondary | ICD-10-CM | POA: Insufficient documentation

## 2018-06-06 DIAGNOSIS — Z9221 Personal history of antineoplastic chemotherapy: Secondary | ICD-10-CM | POA: Insufficient documentation

## 2018-06-06 DIAGNOSIS — C678 Malignant neoplasm of overlapping sites of bladder: Principal | ICD-10-CM | POA: Insufficient documentation

## 2018-06-06 DIAGNOSIS — I252 Old myocardial infarction: Secondary | ICD-10-CM | POA: Insufficient documentation

## 2018-06-06 HISTORY — PX: TRANSURETHRAL RESECTION OF BLADDER TUMOR: SHX2575

## 2018-06-06 HISTORY — PX: IR NEPHROSTOMY PLACEMENT RIGHT: IMG6064

## 2018-06-06 HISTORY — PX: CYSTOSCOPY WITH URETEROSCOPY: SHX5123

## 2018-06-06 LAB — BASIC METABOLIC PANEL
Anion gap: 10 (ref 5–15)
BUN: 18 mg/dL (ref 8–23)
CO2: 25 mmol/L (ref 22–32)
Calcium: 8.1 mg/dL — ABNORMAL LOW (ref 8.9–10.3)
Chloride: 104 mmol/L (ref 98–111)
Creatinine, Ser: 1.47 mg/dL — ABNORMAL HIGH (ref 0.61–1.24)
GFR calc Af Amer: 54 mL/min — ABNORMAL LOW (ref >60)
GFR calc non Af Amer: 47 mL/min — ABNORMAL LOW (ref >60)
Glucose, Bld: 146 mg/dL — ABNORMAL HIGH (ref 70–99)
Potassium: 3.3 mmol/L — ABNORMAL LOW (ref 3.5–5.1)
Sodium: 139 mmol/L (ref 135–145)

## 2018-06-06 LAB — CBC
HCT: 40.7 % (ref 39.0–52.0)
Hemoglobin: 13 g/dL (ref 13.0–17.0)
MCH: 28 pg (ref 26.0–34.0)
MCHC: 31.9 g/dL (ref 30.0–36.0)
MCV: 87.5 fL (ref 80.0–100.0)
Platelets: 296 10*3/uL (ref 150–400)
RBC: 4.65 MIL/uL (ref 4.22–5.81)
RDW: 15.9 % — ABNORMAL HIGH (ref 11.5–15.5)
WBC: 9.9 10*3/uL (ref 4.0–10.5)
nRBC: 0 % (ref 0.0–0.2)

## 2018-06-06 LAB — GLUCOSE, CAPILLARY: Glucose-Capillary: 141 mg/dL — ABNORMAL HIGH (ref 70–99)

## 2018-06-06 LAB — PROTIME-INR
INR: 1 (ref 0.8–1.2)
Prothrombin Time: 12.7 seconds (ref 11.4–15.2)

## 2018-06-06 SURGERY — TURBT (TRANSURETHRAL RESECTION OF BLADDER TUMOR)
Anesthesia: General

## 2018-06-06 MED ORDER — PROPOFOL 10 MG/ML IV BOLUS
INTRAVENOUS | Status: AC
  Filled 2018-06-06: qty 20

## 2018-06-06 MED ORDER — LIDOCAINE 2% (20 MG/ML) 5 ML SYRINGE
INTRAMUSCULAR | Status: DC | PRN
  Administered 2018-06-06: 70 mg via INTRAVENOUS

## 2018-06-06 MED ORDER — MORPHINE SULFATE (PF) 2 MG/ML IV SOLN
2.0000 mg | INTRAVENOUS | Status: DC | PRN
  Administered 2018-06-06 – 2018-06-07 (×2): 2 mg via INTRAVENOUS
  Filled 2018-06-06 (×2): qty 1

## 2018-06-06 MED ORDER — MIDAZOLAM HCL 2 MG/2ML IJ SOLN
INTRAMUSCULAR | Status: AC | PRN
  Administered 2018-06-06: 0.5 mg via INTRAVENOUS
  Administered 2018-06-06: 1 mg via INTRAVENOUS

## 2018-06-06 MED ORDER — TRAMADOL HCL 50 MG PO TABS: 50.0000 mg | ORAL_TABLET | Freq: Four times a day (QID) | ORAL | 0 refills | Status: DC | PRN

## 2018-06-06 MED ORDER — AMLODIPINE BESYLATE 10 MG PO TABS
10.0000 mg | ORAL_TABLET | Freq: Every day | ORAL | Status: DC
  Administered 2018-06-07: 10 mg via ORAL
  Filled 2018-06-06: qty 1

## 2018-06-06 MED ORDER — ONDANSETRON HCL 4 MG/2ML IJ SOLN
INTRAMUSCULAR | Status: AC
  Filled 2018-06-06: qty 2

## 2018-06-06 MED ORDER — FENTANYL CITRATE (PF) 100 MCG/2ML IJ SOLN
INTRAMUSCULAR | Status: DC | PRN
  Administered 2018-06-06 (×5): 50 ug via INTRAVENOUS

## 2018-06-06 MED ORDER — NITROGLYCERIN 0.4 MG SL SUBL: 0.4000 mg | SUBLINGUAL_TABLET | SUBLINGUAL | Status: DC | PRN

## 2018-06-06 MED ORDER — ONDANSETRON HCL 4 MG/2ML IJ SOLN
INTRAMUSCULAR | Status: DC | PRN
  Administered 2018-06-06: 4 mg via INTRAVENOUS

## 2018-06-06 MED ORDER — ASPIRIN EC 81 MG PO TBEC
81.0000 mg | DELAYED_RELEASE_TABLET | Freq: Every day | ORAL | Status: DC
  Administered 2018-06-07: 81 mg via ORAL
  Filled 2018-06-06: qty 1

## 2018-06-06 MED ORDER — SUCCINYLCHOLINE CHLORIDE 200 MG/10ML IV SOSY
PREFILLED_SYRINGE | INTRAVENOUS | Status: DC | PRN
  Administered 2018-06-06: 100 mg via INTRAVENOUS

## 2018-06-06 MED ORDER — FESOTERODINE FUMARATE ER 8 MG PO TB24
8.0000 mg | ORAL_TABLET | Freq: Every day | ORAL | Status: DC
  Administered 2018-06-06 – 2018-06-07 (×2): 8 mg via ORAL
  Filled 2018-06-06 (×2): qty 1

## 2018-06-06 MED ORDER — SODIUM CHLORIDE 0.9% FLUSH
5.0000 mL | Freq: Three times a day (TID) | INTRAVENOUS | Status: DC
  Administered 2018-06-06 – 2018-06-07 (×3): 5 mL

## 2018-06-06 MED ORDER — SUCCINYLCHOLINE CHLORIDE 200 MG/10ML IV SOSY
PREFILLED_SYRINGE | INTRAVENOUS | Status: AC
  Filled 2018-06-06: qty 10

## 2018-06-06 MED ORDER — ALBUTEROL SULFATE HFA 108 (90 BASE) MCG/ACT IN AERS: 2.0000 | INHALATION_SPRAY | Freq: Four times a day (QID) | RESPIRATORY_TRACT | Status: DC | PRN

## 2018-06-06 MED ORDER — FLUORESCEIN SODIUM 10 % IV SOLN
INTRAVENOUS | Status: DC | PRN
  Administered 2018-06-06: 50 mg via INTRAVENOUS

## 2018-06-06 MED ORDER — MOMETASONE FURO-FORMOTEROL FUM 200-5 MCG/ACT IN AERO
2.0000 | INHALATION_SPRAY | Freq: Two times a day (BID) | RESPIRATORY_TRACT | Status: DC
  Administered 2018-06-07: 2 via RESPIRATORY_TRACT
  Filled 2018-06-06: qty 8.8

## 2018-06-06 MED ORDER — UMECLIDINIUM BROMIDE 62.5 MCG/INH IN AEPB
1.0000 | INHALATION_SPRAY | Freq: Every day | RESPIRATORY_TRACT | Status: DC
  Administered 2018-06-07: 1 via RESPIRATORY_TRACT
  Filled 2018-06-06: qty 7

## 2018-06-06 MED ORDER — MIDAZOLAM HCL 2 MG/2ML IJ SOLN
INTRAMUSCULAR | Status: AC
  Filled 2018-06-06: qty 4

## 2018-06-06 MED ORDER — PANTOPRAZOLE SODIUM 40 MG PO TBEC
40.0000 mg | DELAYED_RELEASE_TABLET | Freq: Every day | ORAL | Status: DC
  Administered 2018-06-06 – 2018-06-07 (×2): 40 mg via ORAL
  Filled 2018-06-06 (×2): qty 1

## 2018-06-06 MED ORDER — MEPERIDINE HCL 50 MG/ML IJ SOLN: 6.2500 mg | INTRAMUSCULAR | Status: DC | PRN

## 2018-06-06 MED ORDER — TIOTROPIUM BROMIDE MONOHYDRATE 2.5 MCG/ACT IN AERS: 2.0000 | INHALATION_SPRAY | Freq: Every morning | RESPIRATORY_TRACT | Status: DC

## 2018-06-06 MED ORDER — ALBUTEROL SULFATE (2.5 MG/3ML) 0.083% IN NEBU: 2.5000 mg | INHALATION_SOLUTION | Freq: Four times a day (QID) | RESPIRATORY_TRACT | Status: DC | PRN

## 2018-06-06 MED ORDER — LACTATED RINGERS IV SOLN
INTRAVENOUS | Status: DC
  Administered 2018-06-06 – 2018-06-07 (×4): via INTRAVENOUS

## 2018-06-06 MED ORDER — TAMSULOSIN HCL 0.4 MG PO CAPS
0.4000 mg | ORAL_CAPSULE | Freq: Every day | ORAL | Status: DC
  Administered 2018-06-07: 0.4 mg via ORAL
  Filled 2018-06-06: qty 1

## 2018-06-06 MED ORDER — FUROSEMIDE 10 MG/ML IJ SOLN
INTRAMUSCULAR | Status: AC
  Filled 2018-06-06: qty 2

## 2018-06-06 MED ORDER — PROPOFOL 10 MG/ML IV BOLUS
INTRAVENOUS | Status: DC | PRN
  Administered 2018-06-06: 50 mg via INTRAVENOUS
  Administered 2018-06-06: 100 mg via INTRAVENOUS

## 2018-06-06 MED ORDER — LIDOCAINE HCL 1 % IJ SOLN
INTRAMUSCULAR | Status: AC | PRN
  Administered 2018-06-06: 5 mL

## 2018-06-06 MED ORDER — ROSUVASTATIN CALCIUM 20 MG PO TABS
40.0000 mg | ORAL_TABLET | Freq: Every day | ORAL | Status: DC
  Administered 2018-06-06 – 2018-06-07 (×2): 40 mg via ORAL
  Filled 2018-06-06 (×2): qty 2

## 2018-06-06 MED ORDER — FENTANYL CITRATE (PF) 100 MCG/2ML IJ SOLN
INTRAMUSCULAR | Status: AC | PRN
  Administered 2018-06-06 (×2): 25 ug via INTRAVENOUS

## 2018-06-06 MED ORDER — FENTANYL CITRATE (PF) 250 MCG/5ML IJ SOLN
INTRAMUSCULAR | Status: AC
  Filled 2018-06-06: qty 5

## 2018-06-06 MED ORDER — METOCLOPRAMIDE HCL 5 MG/ML IJ SOLN
INTRAMUSCULAR | Status: AC
  Filled 2018-06-06: qty 2

## 2018-06-06 MED ORDER — TRAMADOL HCL 50 MG PO TABS
50.0000 mg | ORAL_TABLET | Freq: Four times a day (QID) | ORAL | Status: DC | PRN
  Administered 2018-06-06 – 2018-06-07 (×2): 50 mg via ORAL
  Filled 2018-06-06 (×2): qty 1

## 2018-06-06 MED ORDER — SODIUM CHLORIDE 0.9 % IR SOLN
Status: DC | PRN
  Administered 2018-06-06: 24000 mL via INTRAVESICAL

## 2018-06-06 MED ORDER — FLUORESCEIN SODIUM 10 % IV SOLN
INTRAVENOUS | Status: AC
  Filled 2018-06-06: qty 5

## 2018-06-06 MED ORDER — CEFAZOLIN SODIUM-DEXTROSE 2-4 GM/100ML-% IV SOLN
2.0000 g | INTRAVENOUS | Status: AC
  Administered 2018-06-06: 2 g via INTRAVENOUS
  Filled 2018-06-06: qty 100

## 2018-06-06 MED ORDER — PROMETHAZINE HCL 25 MG/ML IJ SOLN
6.2500 mg | Freq: Once | INTRAMUSCULAR | Status: AC
  Administered 2018-06-06: 15:00:00 6.25 mg via INTRAVENOUS

## 2018-06-06 MED ORDER — PROMETHAZINE HCL 25 MG/ML IJ SOLN
INTRAMUSCULAR | Status: AC
  Filled 2018-06-06: qty 1

## 2018-06-06 MED ORDER — FUROSEMIDE 10 MG/ML IJ SOLN
INTRAMUSCULAR | Status: DC | PRN
  Administered 2018-06-06: 20 mg via INTRAMUSCULAR

## 2018-06-06 MED ORDER — IOHEXOL 300 MG/ML  SOLN
INTRAMUSCULAR | Status: DC | PRN
  Administered 2018-06-06: 14:00:00 10 mL

## 2018-06-06 MED ORDER — FOLIC ACID 1 MG PO TABS
1.0000 mg | ORAL_TABLET | Freq: Every day | ORAL | Status: DC
  Administered 2018-06-06 – 2018-06-07 (×2): 1 mg via ORAL
  Filled 2018-06-06 (×2): qty 1

## 2018-06-06 MED ORDER — FENTANYL CITRATE (PF) 100 MCG/2ML IJ SOLN
INTRAMUSCULAR | Status: AC
  Filled 2018-06-06: qty 4

## 2018-06-06 MED ORDER — FENTANYL CITRATE (PF) 100 MCG/2ML IJ SOLN: 25.0000 ug | INTRAMUSCULAR | Status: DC | PRN

## 2018-06-06 MED ORDER — PHENAZOPYRIDINE HCL 200 MG PO TABS: 200.0000 mg | ORAL_TABLET | Freq: Three times a day (TID) | ORAL | 0 refills | Status: DC | PRN

## 2018-06-06 MED ORDER — IOHEXOL 300 MG/ML  SOLN
50.0000 mL | Freq: Once | INTRAMUSCULAR | Status: AC | PRN
  Administered 2018-06-06: 17:00:00 10 mL

## 2018-06-06 MED ORDER — LIDOCAINE-EPINEPHRINE 1 %-1:100000 IJ SOLN
INTRAMUSCULAR | Status: AC
  Filled 2018-06-06: qty 1

## 2018-06-06 MED ORDER — METFORMIN HCL 500 MG PO TABS
500.0000 mg | ORAL_TABLET | Freq: Two times a day (BID) | ORAL | Status: DC
  Administered 2018-06-06 – 2018-06-07 (×3): 500 mg via ORAL
  Filled 2018-06-06 (×3): qty 1

## 2018-06-06 MED ORDER — METOCLOPRAMIDE HCL 5 MG/ML IJ SOLN
10.0000 mg | Freq: Once | INTRAMUSCULAR | Status: AC | PRN
  Administered 2018-06-06: 10 mg via INTRAVENOUS

## 2018-06-06 MED ORDER — LIDOCAINE 2% (20 MG/ML) 5 ML SYRINGE
INTRAMUSCULAR | Status: AC
  Filled 2018-06-06: qty 5

## 2018-06-06 SURGICAL SUPPLY — 27 items
BAG URINE DRAINAGE (UROLOGICAL SUPPLIES) ×2 IMPLANT
BAG URO CATCHER STRL LF (MISCELLANEOUS) ×4 IMPLANT
BASKET ZERO TIP NITINOL 2.4FR (BASKET) IMPLANT
BSKT STON RTRVL ZERO TP 2.4FR (BASKET)
CATH FOLEY 2WAY SLVR  5CC 18FR (CATHETERS) ×2
CATH FOLEY 2WAY SLVR 5CC 18FR (CATHETERS) IMPLANT
CATH URET 5FR 28IN OPEN ENDED (CATHETERS) ×4 IMPLANT
CLOTH BEACON ORANGE TIMEOUT ST (SAFETY) ×4 IMPLANT
ELECT REM PT RETURN 15FT ADLT (MISCELLANEOUS) ×2 IMPLANT
EXTRACTOR STONE 1.7FRX115CM (UROLOGICAL SUPPLIES) IMPLANT
GLOVE BIOGEL M STRL SZ7.5 (GLOVE) ×4 IMPLANT
GOWN STRL REUS W/TWL XL LVL3 (GOWN DISPOSABLE) ×4 IMPLANT
GUIDEWIRE ANG ZIPWIRE 038X150 (WIRE) IMPLANT
GUIDEWIRE STR DUAL SENSOR (WIRE) ×4 IMPLANT
KIT TURNOVER KIT A (KITS) IMPLANT
LOOP CUT BIPOLAR 24F LRG (ELECTROSURGICAL) ×2 IMPLANT
MANIFOLD NEPTUNE II (INSTRUMENTS) ×4 IMPLANT
NDL SAFETY ECLIPSE 18X1.5 (NEEDLE) ×2 IMPLANT
NEEDLE HYPO 18GX1.5 SHARP (NEEDLE)
PACK CYSTO (CUSTOM PROCEDURE TRAY) ×4 IMPLANT
SHEATH URETERAL 12FRX28CM (UROLOGICAL SUPPLIES) IMPLANT
SHEATH URETERAL 12FRX35CM (MISCELLANEOUS) IMPLANT
SYRINGE IRR TOOMEY STRL 70CC (SYRINGE) ×2 IMPLANT
TUBING CONNECTING 10 (TUBING) ×3 IMPLANT
TUBING CONNECTING 10' (TUBING) ×1
TUBING UROLOGY SET (TUBING) ×4 IMPLANT
WIRE COONS/BENSON .038X145CM (WIRE) IMPLANT

## 2018-06-06 NOTE — Op Note (Signed)
Preoperative diagnosis:  1. Recurrence of his muscle invasive bladder cancer 2. Right hydroureteronephrosis  Postoperative diagnosis:  1. Same  Procedure: 1. Transurethral resection of bladder tumor 3.5 cm 2. Left retrograde pyelogram with interpretation  Surgeon: [name], MD  Anesthesia: General  Complications: None  Intraoperative findings:  #1: The patient's tumor was located at the right trigonal/bladder neck area and encompassing the right ureteral orifice.  The right ureteral orifice was not identified during the case. #2: The patient's left retrograde pyelogram was performed with 10 cc of Omnipaque contrast and demonstrated normal caliber ureter with no hydroureteronephrosis or filling defects.  EBL: Minimal  Specimens: Bladder tumor  Indication: [name] is a 73 y.o. patient with history of muscle invasive bladder cancer and underwent chemoradiation for primary treatment.  On surveillance he was noted to have an area of abnormality at the right trigonal region and CT scan confirmed this with additional findings of right hydronephrosis..  After reviewing the management options for treatment, he elected to proceed with the above surgical procedure(s). We have discussed the potential benefits and risks of the procedure, side effects of the proposed treatment, the likelihood of the patient achieving the goals of the procedure, and any potential problems that might occur during the procedure or recuperation. Informed consent has been obtained.  Description of procedure:  The patient was taken to the operating room and general anesthesia was induced.  The patient was placed in the dorsal lithotomy position, prepped and draped in the usual sterile fashion, and preoperative antibiotics were administered. A preoperative time-out was performed.   The 21 French cystoscope I advanced through the patient's urethra into the bladder under visual guidance.  Cystoscopy commenced.   The right ureteral orifice was not able to be identified, the left ureteral orifice was orthotopic.  There was some erythema around the bladder diffusely, but the largest area was the tumor overlying the patient's right ureteral orifice.  I then used a 5 [place] open-ended catheter and performed a left retrograde pyelogram with the above findings.  I then exchanged the 21 French cystoscope for the 26 French resectoscope sheath using the visual obturator.  I then exchanged the obturator for the loop resection element and proceeded to perform a transurethral resection of the bladder tumor.  I did not identify the right ureteral orifice, despite giving fluoroscopy seen as well as Lasix.  After searching for the orifice for approximately 25 minutes I emptied the patient's bladder ensuring hemostasis.  The bladder chips were sent to pathology.  I placed an 6 French Foley catheter as well as a B&O suppository into the patient's rectum.  The patient was subsequently extubated and returned to the PACU in stable condition.  Disposition: The patient will be admitted to the hospital and a percutaneous right nephrostomy tube placed.  [name], M.D.

## 2018-06-06 NOTE — Procedures (Signed)
Pre Procedure Dx: Hydronephrosis Post Procedural Dx: Same  Successful US and fluoroscopic guided placement of a right sided PCN with end coiled and locked within the renal pelvis. PCN connected to gravity bag.  EBL: None Complications: None immediate.  Jay Elizeo Rodriques, MD Pager #: [phone]     

## 2018-06-06 NOTE — Discharge Instructions (Signed)

## 2018-06-06 NOTE — Transfer of Care (Signed)
Immediate Anesthesia Transfer of Care Note  Patient: [name]  Procedure(s) Performed: TRANSURETHRAL RESECTION OF BLADDER TUMOR (TURBT) (N/A ) CYSTOSCOPY WITH LEFT RETROGRADE PYELOGRAM (Left )  Patient Location: PACU  Anesthesia Type:General  Level of Consciousness: drowsy and patient cooperative  Airway & Oxygen Therapy: Patient Spontanous Breathing and Patient connected to face mask oxygen  Post-op Assessment: Report given to RN and Post -op Vital signs reviewed and stable  Post vital signs: Reviewed and stable  Last Vitals:  Vitals Value Taken Time  BP 161/99 06/06/2018  2:08 PM  Temp    Pulse 83 06/06/2018  2:11 PM  Resp 19 06/06/2018  2:11 PM  SpO2 100 % 06/06/2018  2:11 PM  Vitals shown include unvalidated device data.  Last Pain:  Vitals:   06/06/18 1046  TempSrc: Oral  PainSc:       Patients Stated Pain Goal: 3 (12/31/80 5003)  Complications: No apparent anesthesia complications

## 2018-06-06 NOTE — Interval H&P Note (Signed)
History and Physical Interval Note:  06/06/2018 11:52 AM  [name]  has presented today for surgery, with the diagnosis of BLADDER CANCER.  The various methods of treatment have been discussed with the patient and family. After consideration of risks, benefits and other options for treatment, the patient has consented to  Procedure(s): TRANSURETHRAL RESECTION OF BLADDER TUMOR (TURBT) (N/A) CYSTOSCOPY WITH URETEROSCOPY BILATERAL RETROGRADE PYELOGRAM (Bilateral) as a surgical intervention.  The patient's history has been reviewed, patient examined, no change in status, stable for surgery.  I have reviewed the patient's chart and labs.  Questions were answered to the patient's satisfaction.     [name]

## 2018-06-06 NOTE — Anesthesia Postprocedure Evaluation (Signed)
Anesthesia Post Note  Patient: [name]  Procedure(s) Performed: TRANSURETHRAL RESECTION OF BLADDER TUMOR (TURBT) (N/A ) CYSTOSCOPY WITH LEFT RETROGRADE PYELOGRAM (Left )     Patient location during evaluation: PACU Anesthesia Type: General Level of consciousness: awake and alert Pain management: pain level controlled Vital Signs Assessment: post-procedure vital signs reviewed and stable Respiratory status: spontaneous breathing, nonlabored ventilation, respiratory function stable and patient connected to nasal cannula oxygen Cardiovascular status: blood pressure returned to baseline and stable Postop Assessment: no apparent nausea or vomiting Anesthetic complications: no    Last Vitals:  Vitals:   06/06/18 1730 06/06/18 1813  BP: 139/75 (!) 162/77  Pulse: 85 85  Resp: 16 17  Temp: 37.1 C   SpO2: 100% 95%    Last Pain:  Vitals:   06/06/18 1630  TempSrc:   PainSc: Asleep                 Montez Hageman

## 2018-06-06 NOTE — Consult Note (Signed)
Chief Complaint: Patient was seen in consultation today for right percutaneous nephrostomy  Referring Physician(s): Herrick,B  Supervising Physician: Sandi Mariscal  Patient Status: [facility] - In-pt  History of Present Illness: [name] is a 73 y.o. male, ex smoker,  with past medical history significant for diabetes, hyperlipidemia, coronary artery disease with prior MIs in 2011/ 2014, GERD, hypertension, COPD, and stage III bladder carcinoma with prior TURBT and chemoradiation. He presents now with what appears to be local recurrence of bladder cancer at his right ureteral orifice with associated right hydronephrosis and latest creatinine of 1.52.  He is status post cystoscopy/ureteroscopy earlier today , however stent could not be placed.  Request now received for right percutaneous nephrostomy.  He is COVID-19 negative.  WBC 9.4, hemoglobin 12.7, platelets 277k, PT/INR pending.  Past Medical History:  Diagnosis Date  . Allergic rhinitis   . Aortic atherosclerosis ([place]) 08/03/2016   Noted on CXR  . CAD S/P BMS PCI (mid RCA, prox RCA & RPAV)    cardiologist-  Dr. [name]: a). NSTEMI/[place] 06/2009: Mod CAD & (-) Myoview; b). 02/2012: Inf STEMI s/p Veriflex BMS PCI (3.5 x 12 - 3.75 mm) -mid RCA. c). Exertional Angina / + MYOVIEW -->  BMS PCI x 2- prox RCA (Veriflex BMS 4.0 x 12), rPAV (3.0 x 12);; d) LAST CATH 12/2014: 40% ost LAD, 80% ostial D2, 50% midCx.  Patent RCA - RPAV/PL stents.  Medical Rx  . Centriacinar emphysema ([place])   . Chest pain x 1 in last 6 months  . COPD, group C, by GOLD 2017 classification [facility]) pulmonologist-- dr mcquid   former smoker--- severe OSA w/ hx multiple hospital admissions w/ exacerbation's  . Dysuria   . Emphysema lung ([place]) 08/03/2016   Noted on CXR  . Essential hypertension   . Full dentures   . GERD (gastroesophageal reflux disease)   . Grade I diastolic dysfunction 68/34/1962   noted on ECHO  . Hematuria 04/02/2018  . History of cardiomegaly    Noted on CXR 07/28/2017  . History of non-ST elevation myocardial infarction (NSTEMI) 06/19/2009   in setting COPD w/ exacerbation;  negative myoview for ischemia;  previously had cardiac cath 06-08-2009 w/ diffuse nonobtructive CAD and  normal LVF  . History of prostatitis   . History of ST elevation myocardial infarction (STEMI) 02/15/2012   in setting of COPD w/ exacerbation; per cardiac cath PCI and stenting to RCA  . Hyperlipidemia   . Lower abdominal pain   . LVH (left ventricular hypertrophy) 07/27/2017   Moderate, noted on ECHO  . Pneumonia 07/28/2017   Noted on CXR   . Pulmonary nodules 03/05/2018   Stable 4 mm left upper lobe nodule  . 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 Type 2 diabetes mellitus ([place])   . Wears glasses     Past Surgical History:  Procedure Laterality Date  . CARDIAC CATHETERIZATION N/A 12/27/2014   Procedure: Left Heart Cath and Coronary Angiography;  Surgeon: Jettie Booze, MD;  Location: [place] CV LAB;  Service: Cardiovascular;  Laterality: N/A; Ost-prox LAD 40%, ost D2 (small) 80%, mCFx 50%, mild to moderate left sided disease, patent RCA (p, m & RPAV) stents, normal LVF  . COLONOSCOPY    . CYSTOSCOPY W/ RETROGRADES Bilateral 09/07/2016   Procedure: CYSTOSCOPY WITH BILATERAL RETROGRADE PYELOGRAM;  Surgeon: [name], MD;  Location: [facility];  Service: Urology;  Laterality: Bilateral;  . CYSTOSCOPY W/ RETROGRADES Bilateral 03/15/2017   Procedure: CYSTOSCOPY WITH RETROGRADE PYELOGRAM;  Surgeon: [name], MD;  Location: [facility];  Service: Urology;  Laterality: Bilateral;  . CYSTOSCOPY WITH RETROGRADE PYELOGRAM, URETEROSCOPY AND STENT PLACEMENT Left 10/19/2016   Procedure: CYSTOSCOPY WITH RETROGRADE PYELOGRAM, URETEROSCOPY AND STENT PLACEMENT;  Surgeon: [name], MD;   Location: [facility];  Service: Urology;  Laterality: Left;  . ESOPHAGOGASTRODUODENOSCOPY  2004  . LEFT HEART CATH AND CORONARY ANGIOGRAPHY  06/08/2009   Dr. Burt Knack: LAD 40-50%, LCX 40%, RCA 25% EF 65%m -thought to be related to coronary spasm.  [name] LEFT HEART CATHETERIZATION WITH CORONARY ANGIOGRAM N/A 02/15/2012   Procedure: LEFT HEART CATHETERIZATION WITH CORONARY ANGIOGRAM;  Surgeon: Peter M [name], MD;  Location[facility] C[facility] LAB;  Service: Cardiovascular; inferior STEMI:  mid RCA 99% (BMS PCI), normal LVF, diffuse 20-30% distal RCA and 50% RPL/PAB.  Mid LCx 50-60%.  Mid LAD 30-40% at D1 with ostial D1 50%  . LEFT HEART CATHETERIZATION WITH CORONARY ANGIOGRAM N/A 07/01/2012   Procedure: LEFT HEART CATHETERIZATION WITH CORONARY ANGIOGRAM;  Surgeon: Peter M [name], MD;  Location: [facility] CATH LAB;  Service: Cardiovascular:: CLASS III ANGINA & + MYOVIEW (inf Ischemia) -- pRCA RCA 90% (BMS PCI), widely patent mid RCA BMS & diffuse 30-40% dist RCA, RPAV focal 80% (BMS PCI).  30% P-M LAD.  20% circumflex marginal.  EF 55-65%.  [name] LEFT HEART CATHETERIZATION WITH CORONARY ANGIOGRAM N/A 08/03/2013   Procedure: LEFT HEART CATHETERIZATION WITH CORONARY ANGIOGRAM;  Surgeon: Peter M [name], MD;  Location: [facility] CATH LAB;  Service: Cardiovascular;  Laterality: N/A;  nonobstuctive CAD, --  Mild LAD (mid 40%).  Mild circumflex.  Patent mid RCA and PAB stents with mild diffuse 30% disease elsewhere (~30% RPAV ISR).  Normal LV function..   . NM MYOVIEW LTD  06/25/2012   Dr. [name]: INTERMEDIATE RISK - w/ medium-sized, mild primary reversible basal to mid inferior perfusion defect suggestive of ischemia/  normal LV function and wall motion ,ef 63% --> lead to CATH & BMS PCI of  pRCA & RPAV.  [name] PERCUTANEOUS CORONARY STENT INTERVENTION (PCI-S) N/A 02/15/2012   Procedure: PERCUTANEOUS CORONARY STENT INTERVENTION (PCI-S);  Surgeon: Peter M [name], MD;  Location: [facility] CATH LAB;  Service: Cardiovascular:: mRCA 99% -- Veriflex BMS 3.0 x 12 (3.75 mm)  -insetting of inferior STEMI  . PERCUTANEOUS CORONARY STENT INTERVENTION (PCI-S)  07/01/2012   Procedure: PERCUTANEOUS CORONARY STENT INTERVENTION (PCI-S);  Surgeon: Peter M [name], MD;  Location: [facility] CATH LAB;  Service: Cardiovascular;;  pRCA PCI - Veriflex BMS 4.0 x 12; RPAV PCI - Veriflex BMS 3.0 x 12   . stents to heart x 2    . TRANSTHORACIC ECHOCARDIOGRAM  07/2017   Mod LVH, EF 50-55%, Gr 1 DD. mild PR and TR, trivial pericardial effusion (no change from 07/2016)  . TRANSURETHRAL RESECTION OF BLADDER TUMOR N/A 09/07/2016   Procedure: TRANSURETHRAL RESECTION OF BLADDER TUMOR (TURBT);  Surgeon: [name], MD;  Location: [facility];  Service: Urology;  Laterality: N/A;  . TRANSURETHRAL RESECTION OF BLADDER TUMOR N/A 10/19/2016   Procedure: TRANSURETHRAL RESECTION OF BLADDER TUMOR (TURBT);  Surgeon: [name], MD;  Location: [facility];  Service: Urology;  Laterality: N/A;  . TRANSURETHRAL RESECTION OF BLADDER TUMOR N/A 03/15/2017   Procedure: TRANSURETHRAL RESECTION OF BLADDER TUMOR (TURBT);  Surgeon: [name], MD;  Location: [facility];  Service:  Urology;  Laterality: N/A;    Allergies: Patient has no known allergies.  Medications: Prior to Admission medications   Medication Sig Start Date End Date Taking? Authorizing Provider  albuterol (PROVENTIL HFA) 108 (90 BASE) MCG/ACT inhaler Inhale 2 puffs into the lungs 2 (two) times daily. Patient taking differently: Inhale 2 puffs into the lungs every 6 (six) hours as needed for wheezing or shortness of breath.  05/06/14  Yes Robbie Lis, MD  albuterol (PROVENTIL) (2.5 MG/3ML) 0.083% nebulizer solution Take 2.5 mg by nebulization every 6 (six) hours as needed for wheezing or shortness of breath.   Yes [provider]  amLODipine (NORVASC) 10 MG tablet Take 10 mg by mouth daily.  04/07/17  Yes [provider]  aspirin EC 81 MG tablet Take 81 mg by mouth daily.   Yes [provider]   folic acid (FOLVITE) 1 MG tablet Take 1 mg by mouth daily. 04/20/18  Yes [provider]  ibuprofen (ADVIL) 200 MG tablet Take 400 mg by mouth daily as needed for headache or moderate pain.   Yes [provider]  meloxicam (MOBIC) 15 MG tablet Take 15 mg by mouth daily.   Yes [provider]  metFORMIN (GLUCOPHAGE) 500 MG tablet Take 500 mg by mouth 2 (two) times daily with a meal.    Yes [provider]  nitroGLYCERIN (NITROSTAT) 0.4 MG SL tablet Place 1 tablet (0.4 mg total) under the tongue every 5 (five) minutes as needed for chest pain. 01/05/15  Yes [name], PA  omeprazole (PRILOSEC) 20 MG capsule Take 1 capsule (20 mg total) by mouth 2 (two) times daily before a meal. Please schedule appointment for refills. 01/23/18  Yes [name], Peter M, MD  rosuvastatin (CRESTOR) 40 MG tablet Take 40 mg by mouth daily. 11/29/17  Yes [provider]  SYMBICORT 160-4.5 MCG/ACT inhaler Inhale 2 puffs into the lungs 2 (two) times a day.  05/29/17  Yes [provider]  tamsulosin (FLOMAX) 0.4 MG CAPS capsule Take 2 capsules (0.8 mg total) by mouth every evening. Patient taking differently: Take 0.4 mg by mouth daily after breakfast.  08/08/16  Yes Waldron Session, MD  Tiotropium Bromide Monohydrate (SPIRIVA RESPIMAT) 2.5 MCG/ACT AERS 2 pffs each am 06/03/18  Yes Tanda Rockers, MD  TOVIAZ 8 MG TB24 tablet Take 8 mg by mouth daily.  06/16/17  Yes [provider]  Vitamin D, Ergocalciferol, (DRISDOL) 50000 units CAPS capsule Take 50,000 Units by mouth once a week.  03/28/17  Yes [provider]  budesonide (PULMICORT) 0.5 MG/2ML nebulizer solution Take 2 mLs (0.5 mg total) by nebulization 2 (two) times daily. DX:  496 02/07/11 03/21/11  Parrett, Fonnie Mu, NP  esomeprazole (NEXIUM) 40 MG capsule Take 1 capsule (40 mg total) by mouth daily. 12/28/10 03/21/11  Elsie Stain, MD     Family History  Problem Relation Age of Onset  . CAD Neg Hx        no  known FHx of early CAD    Social History   Socioeconomic History  . Marital status: Married    Spouse name: Not on file  . Number of children: Not on file  . Years of education: Not on file  . Highest education level: Not on file  Occupational History  . Occupation: disability  Social Needs  . Financial resource strain: Not on file  . Food insecurity:    Worry: Not on file    Inability: Not on  file  . Transportation needs:    Medical: Not on file    Non-medical: Not on file  Tobacco Use  . Smoking status: Former Smoker    Packs/day: 1.00    Years: 50.00    Pack years: 50.00    Types: Cigarettes    Last attempt to quit: 01/09/2008    Years since quitting: 10.4  . Smokeless tobacco: Never Used  . Tobacco comment: rolled own cigarettes since age of 89,   Substance and Sexual Activity  . Alcohol use: Yes    Comment: occ  . Drug use: No  . Sexual activity: Not on file  Lifestyle  . Physical activity:    Days per week: Not on file    Minutes per session: Not on file  . Stress: Not on file  Relationships  . Social connections:    Talks on phone: Not on file    Gets together: Not on file    Attends religious service: Not on file    Active member of club or organization: Not on file    Attends meetings of clubs or organizations: Not on file    Relationship status: Not on file  Other Topics Concern  . Not on file  Social History Narrative   Married, 4 children.   Disability secondary to lung disease.    No known family history of premature CAD.      Review of Systems patient currently without fever, headache, chest pain, worsening dyspnea, cough, abdominal/back pain, vomiting.  He does have some nausea and blood tinged urine.  Vital Signs: BP (!) 157/74   Pulse 80   Temp 98.2 F (36.8 C)   Resp 17   Ht 5' 0.05" (1.525 m)   Wt 154 lb (69.9 kg)   SpO2 97%   BMI 30.03 kg/m   Physical Exam patient awake but slightly drowsy.  Chest clear to auscultation  bilaterally.  Heart with regular rate and rhythm.  Abdomen soft, few bowel sounds, nontender.  No lower extremity edema.  Blood-tinged urine in Foley  Imaging: Dg Chest 2 View  Result Date: 06/03/2018 CLINICAL DATA:  73 year old male with history of COPD. Preoperative evaluation. EXAM: CHEST - 2 VIEW COMPARISON:  Chest x-ray 07/28/2017. FINDINGS: Lung volumes are normal. No consolidative airspace disease. No pleural effusions. No pneumothorax. No pulmonary nodule or mass noted. Pulmonary vasculature and the cardiomediastinal silhouette are within normal limits. IMPRESSION: No radiographic evidence of acute cardiopulmonary disease. Electronically Signed   By: Vinnie Langton M.D.   On: 06/03/2018 20:14   Dg C-arm 1-60 Min-no Report  Result Date: 06/06/2018 Fluoroscopy was utilized by the requesting physician.  No radiographic interpretation.    Labs:  CBC: Recent Labs    10/30/17 0928 03/05/18 0838 04/03/18 1006 06/03/18 1010  WBC 7.1 6.0 9.0 9.4  HGB 13.9 14.2 12.9* 12.7*  HCT 43.1 44.1 40.5 39.2  PLT 255 288 289 277    COAGS: Recent Labs    06/17/17 1915  INR 0.88    BMP: Recent Labs    10/30/17 0928 03/05/18 0838 04/03/18 1006 06/03/18 1010  NA 142 142 139 141  K 3.6 3.8 3.7 4.6  CL 106 106 102 111  CO2 25 25 25 23   GLUCOSE 97 111* 173* 137*  BUN 13 10 15 14   CALCIUM 9.1 9.1 8.8* 8.9  CREATININE 0.87 0.96 1.09 1.52*  GFRNONAA >60 >60 >60 45*  GFRAA >60 >60 >60 52*    LIVER FUNCTION TESTS: Recent Labs  07/26/17 1700 07/27/17 0413 10/30/17 0928 03/05/18 0838  BILITOT 0.6 0.6 0.4 0.5  AST 26 27 18 22   ALT 24 25 24 21   ALKPHOS 47 50 69 69  PROT 6.6 6.6 7.1 7.4  ALBUMIN 3.6 3.6 3.7 3.8    TUMOR MARKERS: No results for input(s): AFPTM, CEA, CA199, CHROMGRNA in the last 8760 hours.  Assessment and Plan: 73 y.o. male, ex smoker,  with past medical history significant for diabetes, hyperlipidemia, coronary artery disease with prior MIs in 2011/  2014, GERD, hypertension, COPD, and stage III bladder carcinoma with prior TURBT and chemoradiation. He presents now with what appears to be local recurrence of bladder cancer at his right ureteral orifice with associated right hydronephrosis and latest creatinine of 1.52.  He is status post cystoscopy/ureteroscopy earlier today , however stent could not be placed.  Request now received for right percutaneous nephrostomy.  He is COVID-19 negative.  WBC 9.4, hemoglobin 12.7, platelets 277k, PT/INR pending.  Prior imaging studies have been reviewed by Dr. Pascal Lux.Risks and benefits of procedure were discussed with the patient/pt's daughter Wanda Plump including, but not limited to, infection, bleeding, significant bleeding causing loss or decrease in renal function or damage to adjacent structures.   All of the patient's questions were answered, patient is agreeable to proceed.  Consent signed and in chart.  Procedure scheduled for this afternoon.    Thank you for this interesting consult.  I greatly enjoyed meeting Norris Robenson and look forward to participating in their care.  A copy of this report was sent to the requesting provider on this date.  Electronically Signed: D. Rowe Robert, PA-C 06/06/2018, 2:55 PM   I spent a total of 30 minutes  in face to face in clinical consultation, greater than 50% of which was counseling/coordinating care for right percutaneous nephrostomy

## 2018-06-06 NOTE — Anesthesia Procedure Notes (Signed)
Procedure Name: Intubation Date/Time: 06/06/2018 12:12 PM Performed by: Montel Clock, CRNA Pre-anesthesia Checklist: Patient identified, Emergency Drugs available, Suction available, Patient being monitored and Timeout performed Patient Re-evaluated:Patient Re-evaluated prior to induction Oxygen Delivery Method: Circle system utilized Preoxygenation: Pre-oxygenation with 100% oxygen Induction Type: IV induction and Rapid sequence Laryngoscope Size: Mac and 3 Grade View: Grade I Tube type: Oral Tube size: 7.5 mm Number of attempts: 1 Airway Equipment and Method: Stylet Placement Confirmation: ETT inserted through vocal cords under direct vision,  positive ETCO2 and breath sounds checked- equal and bilateral Secured at: 22 cm Tube secured with: Tape Dental Injury: Teeth and Oropharynx as per pre-operative assessment

## 2018-06-06 NOTE — H&P (Signed)
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 Left malig hydro at time. Treated with XRT by Lisbeth Renshaw 60Gy to pelvis 2019 + Carbopalstin by [facility], Completed therapy at the end of June 2019   Recent Course:  06/2017 - CT -His most recent CT scans that showed no evidence of disease. There is also no longer any left-sided hydroureteronephrosis.   2 - Dysuria, Pelvic Pain, Radiation Cystitis - severe dysuria and pelvic pain expected following pelvic XRT for bladder cancer. UCX negative x several. PVR 06/2017 "30" mL / normal. Sarting pyrdium with tramadol breakthrough 06/2017.   PMH sig for IDDM2, He is [name], hid daughter is very involved in his care.   10/19: The patient is now 3 months s/p chemo/radiation for his muscle invasive bladder cancer. He presents today for cystoscopy and to intiate surveillence of his bladder cancer. The patient has done very well in the interim. He does have urinary frequency and nocturia 4 or 5. He feels that he empties his bladder well. He denies any gross hematuria. He denies any dysuria. The patient also is complaining of erectile dysfunction. This is been on going problem for him for a long time, but now feels healthy enough to address it.   Intv: Patient presents today for surveillence cysto for HG muscle-invasive TCC. He was last seen 4 months prior. The patient had a CT scan which was performed by Dr. [name]. This demonstrated some bladder wall thickening with an irregular wall thickening in the posterior bladder wall at the right UVJ. For otherwise, it was largely unremarkable demonstrating no clear evidence of systemic disease. The patient denies any weight loss. He denies any night sweats. He denies any pain. He is having some slight increased urinary frequency, but denies any hematuria or dysuria.     ALLERGIES: No Allergies    MEDICATIONS: Omeprazole 20 mg capsule,delayed release  Sildenafil Citrate 100 mg tablet 1 tablet PO  Daily  Toviaz 8 mg tablet, extended release 24 hr  Acetaminophen 500 mg tablet  Amlodipine Besylate 10 mg tablet tablet  Aspirin Ec 81 mg tablet, delayed release  Folic Acid  Losartan Potassium 50 mg tablet  Nitroglycerin 0.4 mg tablet, sublingual  Pyridium 200 mg tablet 1 tablet PO Q 8 H PRN  Spiriva  Symbicort 160 mcg-4.5 mcg/actuation hfa aerosol with adapter  Tramadol Hcl 50 mg tablet 1-2 tablet PO Q8 PRN     GU PSH: Bladder Instill AntiCA Agent - 12/19/2016, 11/28/2016, 11/21/2016 Cysto Remove Stent FB Sim - 03/11/2017 Cystoscopy - 10/29/2017, 08/13/2016 Cystoscopy Insert Stent, Left - 10/19/2016 Cystoscopy TURBT >5 cm - 03/15/2017 Cystoscopy TURBT 2-5 cm - 10/19/2016, 09/07/2016 Cystoscopy Ureteroscopy, Left - 10/19/2016 Locm 300-399Mg /Ml Iodine,1Ml - 03/04/2017      PSH Notes: heart stent   NON-GU PSH: None   GU PMH: 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   Tobacco Use Assessment Completed: Used Tobacco in last 30 days? Has never drank.  Drinks 1 caffeinated drink per day.    REVIEW OF SYSTEMS:  GU Review Male:   Patient denies frequent urination, hard to postpone urination, burning/ pain with urination, get up at night to urinate, leakage of urine, stream starts and stops, trouble starting your stream, have to strain to urinate , erection problems, and penile pain.  Gastrointestinal (Upper):   Patient denies nausea, vomiting, and indigestion/  heartburn.  Gastrointestinal (Lower):   Patient denies diarrhea and constipation.  Constitutional:   Patient denies fever, night sweats, weight loss, and fatigue.  Skin:   Patient denies skin rash/ lesion and itching.  Eyes:   Patient denies blurred vision and double vision.  Ears/ Nose/ Throat:   Patient denies sore throat and sinus problems.  Hematologic/Lymphatic:   Patient denies swollen glands and easy bruising.  Cardiovascular:   Patient denies leg swelling and chest pains.  Respiratory:   Patient reports cough. Patient denies shortness of breath.  Endocrine:   Patient denies excessive thirst.  Musculoskeletal:   Patient denies back pain and joint pain.  Neurological:   Patient denies headaches and dizziness.  Psychologic:   Patient denies depression and anxiety.   VITAL SIGNS:      03/11/2018 10:57 AM  Weight 140 lb / 63.5 kg  BP 151/84 mmHg  Pulse 90 /min  Temperature 98.3 F / 36.8 C   MULTI-SYSTEM PHYSICAL EXAMINATION:    Constitutional: Well-nourished. No physical deformities. Normally developed. Good grooming.  Neck: Neck symmetrical, not swollen. Normal tracheal position.  Respiratory: Normal breath sounds. No labored breathing, no use of accessory muscles.   Cardiovascular: Regular rate and rhythm. No murmur, no gallop. Normal temperature, normal extremity pulses, no swelling, no varicosities.   Lymphatic: No enlargement of neck, axillae, groin.  Skin: No paleness, no jaundice, no cyanosis. No lesion, no ulcer, no rash.  Neurologic / Psychiatric: Oriented to time, oriented to place, oriented to person. No depression, no anxiety, no agitation.  Gastrointestinal: No mass, no tenderness, no rigidity, non obese abdomen.  Eyes: Normal conjunctivae. Normal eyelids.  Ears, Nose, Mouth, and Throat: Left ear no scars, no lesions, no masses. Right ear no scars, no lesions, no masses. Nose no scars, no lesions, no masses. Normal hearing. Normal lips.  Musculoskeletal: Normal gait  and station of head and neck.     PAST DATA REVIEWED:  Source Of History:  Patient  Records Review:   Pathology Reports, Previous Doctor Records, Previous Patient Records, POC Tool  X-Ray Review: C.T. Abdomen/Pelvis: Reviewed Films. Discussed With Patient.     PROCEDURES:         Flexible Cystoscopy - 52000  Risks, benefits, and some of the potential complications of the procedure were discussed at length with the patient including infection, bleeding, voiding discomfort, urinary retention, fever, chills, sepsis, and others. All questions were answered. Informed consent was obtained. Sterile technique and intraurethral analgesia were used.  Meatus:  Normal size. Normal location. Normal condition.  Urethra:  No strictures.  External Sphincter:  Normal.  Verumontanum:  Normal.  Prostate:  Non-obstructing. No hyperplasia.  Bladder Neck:  Non-obstructing.  Ureteral Orifices:  Heaping mucosa at the right trigone and right ureteral orifice. The remainder of the bladder was normal  Bladder:  No trabeculation. No tumors. Normal mucosa. No stones.      The lower urinary tract was carefully examined. The procedure was well-tolerated and without complications. Antibiotic instructions were given. Instructions were given to call the office immediately for bloody urine, difficulty urinating, urinary retention, painful or frequent urination, fever, chills, nausea, vomiting or other illness. The  patient stated that he understood these instructions and would comply with them.         Urinalysis w/Scope Dipstick Dipstick Cont'd Micro  Color: Yellow Bilirubin: Neg mg/dL WBC/hpf: 0 - 5/hpf  Appearance: Cloudy Ketones: Neg mg/dL RBC/hpf: >60/hpf  Specific Gravity: 1.015 Blood: 3+ ery/uL Bacteria: NS (Not Seen)  pH: 5.5 Protein: Neg mg/dL Cystals: NS (Not Seen)  Glucose: Neg mg/dL Urobilinogen: 0.2 mg/dL Casts: NS (Not Seen)    Nitrites: Neg Trichomonas: Not Present    Leukocyte Esterase: Neg leu/uL  Mucous: Not Present      Epithelial Cells: NS (Not Seen)      Yeast: NS (Not Seen)      Sperm: Not Present    ASSESSMENT:      ICD-10 Details  1 GU:   Bladder Cancer overlapping sites - C67.8    PLAN:           Document Letter(s):  Created for Patient: Clinical Summary         Notes:   The patient has what appears to be local recurrence of his bladder cancer at the right ureteral orifice, which is present on today cystoscopy as well as on a CT scan performed 1 week ago. I discussed the findings with the patient and his daughter. I recommended that we go to the operating room for re-resection of this area. We will also plan to perform bilateral retrograde pyelograms. I would also like to perform a right-sided diagnostic ureteroscopy with potential biopsy. He may need a stent following this. I went through this with the patient he understood the information that we discussed. We will plan to get this taken care of as soon as possible.

## 2018-06-07 ENCOUNTER — Encounter (HOSPITAL_COMMUNITY): Payer: Self-pay | Admitting: Urology

## 2018-06-07 DIAGNOSIS — C678 Malignant neoplasm of overlapping sites of bladder: Secondary | ICD-10-CM | POA: Diagnosis not present

## 2018-06-07 NOTE — Progress Notes (Signed)
Urine output from urinal 2ml. Patient states pain only when trying to urinate; endorses 8-9/10 at that time. When not trying to urinate, no pain noted. Does endorse suprapubic tenderness with palpation. UOP via urinal this shift 125ml dark reddish colored urine. Nephrostomy output this shift 743ml total now dark pink colored urine, compared to amber colored urine earlier this shift. Bladder scan showed 8-10 ml on multiple scans. Will continue to monitor. MD notified.

## 2018-06-07 NOTE — Progress Notes (Signed)
Post-void residual completed after patient voided 31ml in urinal. Residual #1 with bladder scanner 64ml; residual #2 with bladder scanner 48ml. MD notified.

## 2018-06-07 NOTE — Progress Notes (Signed)
1 Day Post-Op Subjective: The patient's catheter was removed prior to me seeing him this morning.  He has been able to void a small amount, but states that he had severe bladder spasms with the urge to urinate.  He admits to being sore around his right-sided nephrostomy tube, but has no other complaints.  He is tolerating a regular diet and is not having any nausea. Objective: Vital signs in last 24 hours: Temp:  [97.7 F (36.5 C)-99.5 F (37.5 C)] 99.5 F (37.5 C) (05/30 0446) Pulse Rate:  [80-98] 92 (05/30 0446) Resp:  [13-22] 20 (05/30 0446) BP: (125-165)/(49-99) 142/73 (05/30 0446) SpO2:  [90 %-100 %] 90 % (05/30 0446) Weight:  [69.9 kg] 69.9 kg (05/29 1043)  Intake/Output from previous day: 05/29 0701 - 05/30 0700 In: 2395.8 [I.V.:2395.8] Out: 1610 [Urine:3475; Blood:50]  Intake/Output this shift: Total I/O In: -  Out: 180 [Urine:180]  Physical Exam:  General: Alert and oriented CV: RRR, palpable distal pulses Lungs: CTAB, equal chest rise Abdomen: Soft, NTND, no rebound or guarding Gu: Right nephrostomy tube in place and draining amber urine.  The patient's urinal has approximately 50 cc of blood-tinged urine in it. Ext: NT, No erythema  Lab Results: Recent Labs    06/06/18 1455  HGB 13.0  HCT 40.7   BMET Recent Labs    06/06/18 1455  NA 139  K 3.3*  CL 104  CO2 25  GLUCOSE 146*  BUN 18  CREATININE 1.47*  CALCIUM 8.1*     Studies/Results: Dg C-arm 1-60 Min-no Report  Result Date: 06/06/2018 Fluoroscopy was utilized by the requesting physician.  No radiographic interpretation.    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  Keep PCN in place.  Will continue to  monitor.   LOS: 0 days   Ellison Hughs, MD [facility] Pager: [phone]  06/07/2018, 9:05 AM

## 2018-06-07 NOTE — Plan of Care (Signed)
Reviewed discharge instructions; copy given. IV removed; patient ready for discharge.  

## 2018-06-07 NOTE — Plan of Care (Signed)
Patient lying in bed this morning. Pain controlled currently; no other complaints or concerns noted at this time. Will continue to monitor.

## 2018-06-09 ENCOUNTER — Encounter (HOSPITAL_COMMUNITY): Payer: Self-pay | Admitting: Interventional Radiology

## 2018-06-11 NOTE — Discharge Summary (Signed)
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 Briefly, [name] is a 73 y.o. year old patient with recurrent muscle invasive bladder cancer.   Hospital Course: The patient was monitored on the floor.  He was able to void a small amount of bloody urine on POD1 and had >700 mL out of his right sided PCN.    Physical Exam:  General: Alert and oriented CV: RRR, palpable distal pulses Lungs: CTAB, equal chest rise Abdomen: Soft, NTND, no rebound or guarding.  Right PCN in place and draining yellow urine Ext: NT, No erythema  Laboratory values: No results for input(s): HGB, HCT in the last 72 hours. No results for input(s): CREATININE in the last 72 hours.  Disposition: Home  Discharge instruction: The patient was instructed to be ambulatory but told to refrain from heavy lifting, strenuous activity, or driving.  Discharge medications:  Allergies as of 06/07/2018   No Known Allergies     Medication List    STOP taking these medications   ibuprofen 200 MG tablet Commonly known as:  ADVIL     TAKE these medications   albuterol (2.5 MG/3ML) 0.083% nebulizer solution Commonly known as:  PROVENTIL Take 2.5 mg by nebulization every 6 (six) hours as needed for wheezing or shortness of breath. What changed:  Another medication with the same name was changed. Make sure you understand how and when to take each.   albuterol 108 (90 Base) MCG/ACT inhaler Commonly known as:  Proventil HFA Inhale 2 puffs into the lungs 2 (two) times daily. What changed:    when to take this  reasons to take this   amLODipine 10 MG tablet Commonly known as:  NORVASC Take 10 mg by mouth daily.   aspirin EC 81 MG tablet Take 81 mg by mouth daily.   folic acid 1 MG  tablet Commonly known as:  FOLVITE Take 1 mg by mouth daily.   meloxicam 15 MG tablet Commonly known as:  MOBIC Take 15 mg by mouth daily.   metFORMIN 500 MG tablet Commonly known as:  GLUCOPHAGE Take 500 mg by mouth 2 (two) times daily with a meal.   nitroGLYCERIN 0.4 MG SL tablet Commonly known as:  Nitrostat Place 1 tablet (0.4 mg total) under the tongue every 5 (five) minutes as needed for chest pain.   omeprazole 20 MG capsule Commonly known as:  PRILOSEC Take 1 capsule (20 mg total) by mouth 2 (two) times daily before a meal. Please schedule appointment for refills.   phenazopyridine 200 MG tablet Commonly known as:  Pyridium Take 1 tablet (200 mg total) by mouth 3 (three) times daily as needed for pain.   rosuvastatin 40 MG tablet Commonly known as:  CRESTOR Take 40 mg by mouth daily.   Symbicort 160-4.5 MCG/ACT inhaler Generic drug:  budesonide-formoterol Inhale 2 puffs into the lungs 2 (two) times a day.   tamsulosin 0.4 MG Caps capsule Commonly known as:  FLOMAX Take 2 capsules (0.8 mg total) by mouth every evening. What changed:    how much to take  when to take this   Tiotropium Bromide Monohydrate 2.5 MCG/ACT Aers Commonly known as:  Spiriva Respimat 2 pffs each am   Toviaz 8 MG Tb24 tablet Generic drug:  fesoterodine Take 8 mg  by mouth daily.   traMADol 50 MG tablet Commonly known as:  Ultram Take 1 tablet (50 mg total) by mouth every 6 (six) hours as needed.   Vitamin D (Ergocalciferol) 1.25 MG (50000 UT) Caps capsule Commonly known as:  DRISDOL Take 50,000 Units by mouth once a week.       Followup:  Follow-up Information    [name], MD On 06/23/2018.   Specialty:  Urology Why:  1:30 Contact information: [address] [place] [address] [phone]

## 2018-06-16 NOTE — Progress Notes (Signed)
LMTCB

## 2018-06-24 ENCOUNTER — Other Ambulatory Visit (HOSPITAL_COMMUNITY): Payer: Self-pay | Admitting: Urology

## 2018-06-24 DIAGNOSIS — C678 Malignant neoplasm of overlapping sites of bladder: Secondary | ICD-10-CM

## 2018-07-04 ENCOUNTER — Other Ambulatory Visit: Payer: Self-pay | Admitting: Physician Assistant

## 2018-07-07 ENCOUNTER — Other Ambulatory Visit (HOSPITAL_COMMUNITY): Payer: Self-pay | Admitting: Urology

## 2018-07-07 ENCOUNTER — Other Ambulatory Visit (HOSPITAL_COMMUNITY): Payer: Self-pay | Admitting: Interventional Radiology

## 2018-07-07 ENCOUNTER — Ambulatory Visit (HOSPITAL_COMMUNITY)
Admission: RE | Admit: 2018-07-07 | Discharge: 2018-07-07 | Disposition: A | Discharge status: Home / Self Care | Payer: Medicare HMO | Source: Ambulatory Visit | Attending: Urology | Admitting: Urology

## 2018-07-07 ENCOUNTER — Other Ambulatory Visit: Payer: Self-pay

## 2018-07-07 ENCOUNTER — Encounter (HOSPITAL_COMMUNITY): Payer: Self-pay

## 2018-07-07 DIAGNOSIS — Z9221 Personal history of antineoplastic chemotherapy: Secondary | ICD-10-CM | POA: Insufficient documentation

## 2018-07-07 DIAGNOSIS — E785 Hyperlipidemia, unspecified: Secondary | ICD-10-CM | POA: Insufficient documentation

## 2018-07-07 DIAGNOSIS — I10 Essential (primary) hypertension: Secondary | ICD-10-CM | POA: Diagnosis not present

## 2018-07-07 DIAGNOSIS — C678 Malignant neoplasm of overlapping sites of bladder: Secondary | ICD-10-CM

## 2018-07-07 DIAGNOSIS — J449 Chronic obstructive pulmonary disease, unspecified: Secondary | ICD-10-CM | POA: Insufficient documentation

## 2018-07-07 DIAGNOSIS — I700 Atherosclerosis of aorta: Secondary | ICD-10-CM | POA: Insufficient documentation

## 2018-07-07 DIAGNOSIS — Z79899 Other long term (current) drug therapy: Secondary | ICD-10-CM | POA: Diagnosis not present

## 2018-07-07 DIAGNOSIS — Z7982 Long term (current) use of aspirin: Secondary | ICD-10-CM | POA: Diagnosis not present

## 2018-07-07 DIAGNOSIS — I2510 Atherosclerotic heart disease of native coronary artery without angina pectoris: Secondary | ICD-10-CM | POA: Diagnosis not present

## 2018-07-07 DIAGNOSIS — E119 Type 2 diabetes mellitus without complications: Secondary | ICD-10-CM | POA: Insufficient documentation

## 2018-07-07 DIAGNOSIS — K219 Gastro-esophageal reflux disease without esophagitis: Secondary | ICD-10-CM | POA: Diagnosis not present

## 2018-07-07 DIAGNOSIS — Z7984 Long term (current) use of oral hypoglycemic drugs: Secondary | ICD-10-CM | POA: Diagnosis not present

## 2018-07-07 DIAGNOSIS — Z923 Personal history of irradiation: Secondary | ICD-10-CM | POA: Insufficient documentation

## 2018-07-07 DIAGNOSIS — N1330 Unspecified hydronephrosis: Secondary | ICD-10-CM | POA: Insufficient documentation

## 2018-07-07 DIAGNOSIS — I252 Old myocardial infarction: Secondary | ICD-10-CM | POA: Diagnosis not present

## 2018-07-07 DIAGNOSIS — Z87891 Personal history of nicotine dependence: Secondary | ICD-10-CM | POA: Insufficient documentation

## 2018-07-07 HISTORY — PX: IR NEPHROSTOMY EXCHANGE RIGHT: IMG6070

## 2018-07-07 HISTORY — PX: IR URETERAL STENT PLACEMENT EXISTING ACCESS RIGHT: IMG6074

## 2018-07-07 LAB — PROTIME-INR
INR: 1 (ref 0.8–1.2)
Prothrombin Time: 12.8 seconds (ref 11.4–15.2)

## 2018-07-07 LAB — BASIC METABOLIC PANEL
Anion gap: 11 (ref 5–15)
BUN: 28 mg/dL — ABNORMAL HIGH (ref 8–23)
CO2: 25 mmol/L (ref 22–32)
Calcium: 8.8 mg/dL — ABNORMAL LOW (ref 8.9–10.3)
Chloride: 103 mmol/L (ref 98–111)
Creatinine, Ser: 1.14 mg/dL (ref 0.61–1.24)
GFR calc Af Amer: >60 mL/min (ref >60)
GFR calc non Af Amer: >60 mL/min (ref >60)
Glucose, Bld: 97 mg/dL (ref 70–99)
Potassium: 3.1 mmol/L — ABNORMAL LOW (ref 3.5–5.1)
Sodium: 139 mmol/L (ref 135–145)

## 2018-07-07 LAB — GLUCOSE, CAPILLARY: Glucose-Capillary: 86 mg/dL (ref 70–99)

## 2018-07-07 LAB — APTT: aPTT: 30 seconds (ref 24–36)

## 2018-07-07 LAB — CBC
HCT: 40.4 % (ref 39.0–52.0)
Hemoglobin: 12.9 g/dL — ABNORMAL LOW (ref 13.0–17.0)
MCH: 27 pg (ref 26.0–34.0)
MCHC: 31.9 g/dL (ref 30.0–36.0)
MCV: 84.7 fL (ref 80.0–100.0)
Platelets: 354 10*3/uL (ref 150–400)
RBC: 4.77 MIL/uL (ref 4.22–5.81)
RDW: 15.1 % (ref 11.5–15.5)
WBC: 10.5 10*3/uL (ref 4.0–10.5)
nRBC: 0 % (ref 0.0–0.2)

## 2018-07-07 MED ORDER — CIPROFLOXACIN IN D5W 400 MG/200ML IV SOLN
400.0000 mg | Freq: Once | INTRAVENOUS | Status: AC
  Administered 2018-07-07: 14:00:00 400 mg via INTRAVENOUS

## 2018-07-07 MED ORDER — IOHEXOL 300 MG/ML  SOLN
50.0000 mL | Freq: Once | INTRAMUSCULAR | Status: AC | PRN
  Administered 2018-07-07: 20 mL

## 2018-07-07 MED ORDER — FENTANYL CITRATE (PF) 100 MCG/2ML IJ SOLN
INTRAMUSCULAR | Status: AC
  Filled 2018-07-07: qty 2

## 2018-07-07 MED ORDER — CIPROFLOXACIN IN D5W 400 MG/200ML IV SOLN
INTRAVENOUS | Status: AC
  Filled 2018-07-07: qty 200

## 2018-07-07 MED ORDER — LIDOCAINE-EPINEPHRINE (PF) 1 %-1:200000 IJ SOLN
INTRAMUSCULAR | Status: AC | PRN
  Administered 2018-07-07: 5 mL

## 2018-07-07 MED ORDER — LIDOCAINE-EPINEPHRINE (PF) 2 %-1:200000 IJ SOLN
INTRAMUSCULAR | Status: AC
  Filled 2018-07-07: qty 20

## 2018-07-07 MED ORDER — MIDAZOLAM HCL 2 MG/2ML IJ SOLN
INTRAMUSCULAR | Status: AC | PRN
  Administered 2018-07-07 (×4): 1 mg via INTRAVENOUS

## 2018-07-07 MED ORDER — MIDAZOLAM HCL 2 MG/2ML IJ SOLN
INTRAMUSCULAR | Status: AC
  Filled 2018-07-07: qty 4

## 2018-07-07 MED ORDER — SODIUM CHLORIDE 0.9 % IV SOLN
INTRAVENOUS | Status: DC
  Administered 2018-07-07: 13:00:00 via INTRAVENOUS

## 2018-07-07 MED ORDER — FENTANYL CITRATE (PF) 100 MCG/2ML IJ SOLN
INTRAMUSCULAR | Status: AC | PRN
  Administered 2018-07-07 (×2): 50 ug via INTRAVENOUS

## 2018-07-07 NOTE — Procedures (Signed)
Pre Procedure Dx: Bladder Cancer Post Procedure Dx: Same  Successful antegrade placement of a right sided ureteral stent. Successful right sided PCN exchange.  The right sided PCN was capped for a trial of internalization.   EBL: None   No immediate complications.   Ronny Bacon, MD Pager #: [phone]

## 2018-07-07 NOTE — Discharge Instructions (Signed)
Moderate Conscious Sedation, Adult, Care After These instructions provide you with information about caring for yourself after your procedure. Your health care provider may also give you more specific instructions. Your treatment has been planned according to current medical practices, but problems sometimes occur. Call your health care provider if you have any problems or questions after your procedure. What can I expect after the procedure? After your procedure, it is common:  To feel sleepy for several hours.  To feel clumsy and have poor balance for several hours.  To have poor judgment for several hours.  To vomit if you eat too soon. Follow these instructions at home: For at least 24 hours after the procedure:   Do not: ? Participate in activities where you could fall or become injured. ? Drive. ? Use heavy machinery. ? Drink alcohol. ? Take sleeping pills or medicines that cause drowsiness. ? Make important decisions or sign legal documents. ? Take care of children on your own.  Rest. Eating and drinking  Follow the diet recommended by your health care provider.  If you vomit: ? Drink water, juice, or soup when you can drink without vomiting. ? Make sure you have little or no nausea before eating solid foods. General instructions  Have a responsible adult stay with you until you are awake and alert.  Take over-the-counter and prescription medicines only as told by your health care provider.  If you smoke, do not smoke without supervision.  Keep all follow-up visits as told by your health care provider. This is important. Contact a health care provider if:  You keep feeling nauseous or you keep vomiting.  You feel light-headed.  You develop a rash.  You have a fever. Get help right away if:  You have trouble breathing. This information is not intended to replace advice given to you by your health care provider. Make sure you discuss any questions you have  with your health care provider. Document Released: 10/15/2012 Document Revised: 12/07/2016 Document Reviewed: 04/16/2015 Elsevier Patient Education  [employer].   Ureteral Stent Implantation, Care After This sheet gives you information about how to care for yourself after your procedure. Your health care provider may also give you more specific instructions. If you have problems or questions, contact your health care provider. What can I expect after the procedure? After the procedure, it is common to have:  Nausea.  Mild pain when you urinate. You may feel this pain in your lower back or lower abdomen. The pain should stop within a few minutes after you urinate. This may last for up to 1 week.  A small amount of blood in your urine for several days. Follow these instructions at home: Medicines  Take over-the-counter and prescription medicines only as told by your health care provider.  If you were prescribed an antibiotic medicine, take it as told by your health care provider. Do not stop taking the antibiotic even if you start to feel better.  Do not drive for 24 hours if you were given a sedative during your procedure.  Ask your health care provider if the medicine prescribed to you requires you to avoid driving or using heavy machinery. Activity  Rest as told by your health care provider.  Avoid sitting for a long time without moving. Get up to take short walks every 1-2 hours. This is important to improve blood flow and breathing. Ask for help if you feel weak or unsteady.  Return to your normal activities as  told by your health care provider. Ask your health care provider what activities are safe for you. General instructions   Watch for any blood in your urine. Call your health care provider if the amount of blood in your urine increases.  If you have a catheter: ? Follow instructions from your health care provider about taking care of your catheter and collection  bag. ? Do not take baths, swim, or use a hot tub until your health care provider approves. Ask your health care provider if you may take showers. You may only be allowed to take sponge baths.  You may shower tomorrow.  Drink enough fluid to keep your urine pale yellow.  Do not use any products that contain nicotine or tobacco, such as cigarettes, e-cigarettes, and chewing tobacco. These can delay healing after surgery. If you need help quitting, ask your health care provider.  Keep all follow-up visits as told by your health care provider. This is important. Contact a health care provider if:  You have pain that gets worse or does not get better with medicine, especially pain when you urinate.  You have difficulty urinating.  You feel nauseous or you vomit repeatedly during a period of more than 2 days after the procedure. Get help right away if:  Your urine is dark red or has blood clots in it.  You are leaking urine (have incontinence).  The end of the stent comes out of your urethra.  You cannot urinate.  You have sudden, sharp, or severe pain in your abdomen or lower back.  You have a fever.  You have swelling or pain in your legs.  You have difficulty breathing. Summary  After the procedure, it is common to have mild pain when you urinate that goes away within a few minutes after you urinate. This may last for up to 1 week.  Watch for any blood in your urine. Call your health care provider if the amount of blood in your urine increases.  Take over-the-counter and prescription medicines only as told by your health care provider.  Drink enough fluid to keep your urine pale yellow. This information is not intended to replace advice given to you by your health care provider. Make sure you discuss any questions you have with your health care provider. Document Released: 08/27/2012 Document Revised: 10/01/2017 Document Reviewed: 10/02/2017 Elsevier Patient Education  2020  [place].     Percutaneous Nephrostomy Home Guide AS DIRECTED BY MD Percutaneous nephrostomy is a procedure to insert a flexible tube into your kidney so that urine can leave your body. This procedure may be done if a medical condition prevents urine from leaving your kidney in the usual way. During the procedure, the nephrostomy tube is inserted in the right or left side of your lower back and is connected to an external drainage bag. After you have a nephrostomy tube placed, urine will collect in the drainage bag outside of your body. You will need to empty and change the drainage bag as needed. You will also need to take steps to care for the area where the nephrostomy tube was inserted (tube insertion site). How do I care for my nephrostomy tube?  Always keep your tubing, the leg bag, or the bedside drainage bag below the level of your kidney so that your urine drains freely.  Avoid activities that would cause bending or pulling of your tubing. Ask your health care provider what activities are safe for you.  When connecting your  nephrostomy tube to a drainage bag, make sure that there are no kinks in the tubing and that your urine is draining freely. You may want to gently wrap an elastic bandage over the tubing. This will help keep the tubing in place and prevent it from kinking. Make sure there is no tension on the tubing so it does not become dislodged.  At night, you may want to connect your nephrostomy tube or the leg bag to a larger bedside drainage bag. How do I empty the drainage bag? Empty the leg bag or bedside drainage bag whenever it becomes ? full. Also empty it before you go to sleep. Most drainage bags have a drain at the bottom that allows urine to be emptied. Follow these basic steps: 1. Hold the drainage bag over a toilet or collection container. Use a measuring container if your health care provider told you to measure your urine. 2. Open the drain of the bag and allow  the urine to drain out. 3. After all the urine has drained from the drainage bag, close the drain fully. 4. Flush the urine down the toilet. If a collection container was used, rinse the container. How do I change the dressing around the nephrostomy tube? Change your dressing and clean your tube exit site as told by your health care provider. You may need to change the dressing every day for the first 2 weeks after having a nephrostomy tube inserted. After the first 2 weeks, you may be told to change the dressing two times a week. Supplies needed:  Mild soap and water.  Split gauze pads, 4  4 inches (10 x 10 cm).  Gauze pads, 4  4 inches (10 x 10 cm).  Paper tape. How to change the dressing: Because of the location of your nephrostomy tube, you may need help from another person to complete dressing changes. Follow these basic steps: 1. Wash hands with soap and water. 2. Gently remove the tape and dressing from around the nephrostomy tube. Be careful not to pull on the tube while removing the dressing. Avoid using scissors because they may damage the tube. 3. Wash the skin around the tube with mild soap and water, rinse well, and pat the skin dry with a clean cloth. 4. Check the skin around the drain for redness, swelling, pus, warmth, or a bad smell. 5. If the drain was sutured to the skin, check the suture to verify that it is still anchored in the skin. 6. Place two split gauze pads in and around the tube exit site. Do not apply ointments or alcohol to the site. 7. Place a gauze pad on top of the split gauze pad. 8. Coil the tube on top of the gauze. The tubing should rest on the gauze, not on the skin. 9. Place tape around each edge of the gauze pad. 10. Secure the nephrostomy tubing. Make sure that the tube does not kink or become pinched. The tubing should rest on the gauze pad, not on the skin. 11. Dispose of used supplies properly.  How do I replace the drainage bag?  Only if  patient is unable to urinate or feels full/bloated Replace the drainage bag, three-way stopcock, and any extension tubing as told by your health care provider. Make sure you always have an extra drainage bag and connecting tubing available. 1. Empty urine from your drainage bag. 2. Gather a new drainage bag, three-way stopcock, and any extension tubing. 3. Remove the drainage bag,  three-way stopcock, and any extension tubing from the nephrostomy tube. 4. Attach the new leg bag or bedside drainage bag, three-way stopcock, and any extension tubing to the nephrostomy tube. 5. Dispose of the used drainage bag, three-way stopcock, and any extension. Contact a health care provider if:  You have problems with any of the valves or tubing.  You have persistent pain or soreness in your back.  You have more redness, swelling, or pain around your tube insertion site.  You have more fluid or blood coming from your tube insertion site.  Your tube insertion site feels warm to the touch.  You have pus or a bad smell coming from your tube insertion site.  You have increased urine output or you feel burning when urinating. Get help right away if:  You have pain in your abdomen during the first week.  You have chest pain or have trouble breathing.  You have a new appearance of blood in your urine.  You have a fever or chills.  You have back pain that is not relieved by your medicine.  You have decreased urine output.  Your nephrostomy tube comes out. This information is not intended to replace advice given to you by your health care provider. Make sure you discuss any questions you have with your health care provider. Document Released: 10/15/2012 Document Revised: 04/17/2018 Document Reviewed: 10/07/2015 Elsevier Patient Education  2020 [employer].

## 2018-07-07 NOTE — H&P (Signed)
Referring Physician(s): Herrick,Benjamin W  Supervising Physician: Sandi Mariscal  Patient Status:  [facility]  Chief Complaint: Bladder cancer, right hydronephrosis   Subjective: Patient familiar to IR service from right nephrostomy placement on 06/06/2018.  He has a history of diabetes, hyperlipidemia, coronary artery disease with prior MIs in 2011 2014, GERD, hypertension, COPD and stage III bladder carcinoma with prior TURBT and chemoradiation.  He underwent cystoscopy/ureteroscopy on 5/29 with findings of cancer recurrence at right ureteral orifice.  A ureteral stent could not be placed.  He presents again today for right nephrostogram with attempted right ureteral stent placement.  He currently denies fever, headache, chest pain, dyspnea, nausea, vomiting.  He does have occasional cough, ant pelvic/occasional back pain and intermittent hematuria.  Past Medical History:  Diagnosis Date  . Allergic rhinitis   . Aortic atherosclerosis ([place]) 08/03/2016   Noted on CXR  . CAD S/P BMS PCI (mid RCA, prox RCA & RPAV)    cardiologist-  Dr. [name]: a). NSTEMI/[place] 06/2009: Mod CAD & (-) Myoview; b). 02/2012: Inf STEMI s/p Veriflex BMS PCI (3.5 x 12 - 3.75 mm) -mid RCA. c). Exertional Angina / + MYOVIEW -->  BMS PCI x 2- prox RCA (Veriflex BMS 4.0 x 12), rPAV (3.0 x 12);; d) LAST CATH 12/2014: 40% ost LAD, 80% ostial D2, 50% midCx.  Patent RCA - RPAV/PL stents.  Medical Rx  . Centriacinar emphysema ([place])   . Chest pain x 1 in last 6 months  . COPD, group C, by GOLD 2017 classification [facility]) pulmonologist-- dr mcquid   former smoker--- severe OSA w/ hx multiple hospital admissions w/ exacerbation's  . Dysuria   . Emphysema lung ([place]) 08/03/2016   Noted on CXR  . Essential hypertension   . Full dentures   . GERD (gastroesophageal reflux disease)   . Grade I diastolic dysfunction 62/56/3893   noted on ECHO  . Hematuria 04/02/2018  . History of cardiomegaly    Noted on CXR 07/28/2017  . History  of non-ST elevation myocardial infarction (NSTEMI) 06/19/2009   in setting COPD w/ exacerbation;  negative myoview for ischemia;  previously had cardiac cath 06-08-2009 w/ diffuse nonobtructive CAD and  normal LVF  . History of prostatitis   . History of ST elevation myocardial infarction (STEMI) 02/15/2012   in setting of COPD w/ exacerbation; per cardiac cath PCI and stenting to RCA  . Hyperlipidemia   . Lower abdominal pain   . LVH (left ventricular hypertrophy) 07/27/2017   Moderate, noted on ECHO  . Pneumonia 07/28/2017   Noted on CXR   . Pulmonary nodules 03/05/2018   Stable 4 mm left upper lobe nodule  . 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 Type 2 diabetes mellitus ([place])   . Wears glasses    Past Surgical History:  Procedure Laterality Date  . CARDIAC CATHETERIZATION N/A 12/27/2014   Procedure: Left Heart Cath and Coronary Angiography;  Surgeon: Jettie Booze, MD;  Location: [facility] LAB;  Service: Cardiovascular;  Laterality: N/A; Ost-prox LAD 40%, ost D2 (small) 80%, mCFx 50%, mild to moderate left sided disease, patent RCA (p, m & RPAV) stents, normal LVF  . COLONOSCOPY    . CYSTOSCOPY W/ RETROGRADES Bilateral 09/07/2016   Procedure: CYSTOSCOPY WITH BILATERAL RETROGRADE PYELOGRAM;  Surgeon: [name], MD;  Location: [facility];  Service: Urology;  Laterality: Bilateral;  . CYSTOSCOPY W/ RETROGRADES Bilateral 03/15/2017   Procedure: CYSTOSCOPY WITH RETROGRADE PYELOGRAM;  Surgeon: [name], MD;  Location: [facility];  Service: Urology;  Laterality: Bilateral;  . CYSTOSCOPY WITH RETROGRADE PYELOGRAM, URETEROSCOPY AND STENT PLACEMENT Left 10/19/2016   Procedure: CYSTOSCOPY WITH RETROGRADE PYELOGRAM, URETEROSCOPY AND STENT PLACEMENT;  Surgeon: [name], MD;  Location[facility];  Service: Urology;   Laterality: Left;  . CYSTOSCOPY WITH URETEROSCOPY Left 06/06/2018   Procedure: CYSTOSCOPY WITH LEFT RETROGRADE PYELOGRAM;  Surgeon: [name], MD;  Location[facility];  Service: Urology;  Laterality: Left;  . ESOPHAGOGASTRODUODENOSCOPY  2004  . IR NEPHROSTOMY PLACEMENT RIGHT  06/06/2018  . LEFT HEART CATH AND CORONARY ANGIOGRAPHY  06/08/2009   Dr. Burt Knack: LAD 40-50%, LCX 40%, RCA 25% EF 65%m -thought to be related to coronary spasm.  [name] LEFT HEART CATHETERIZATION WITH CORONARY ANGIOGRAM N/A 02/15/2012   Procedure: LEFT HEART CATHETERIZATION WITH CORONARY ANGIOGRAM;  Surgeon: Peter M [name], MD;  Location: [facility] CATH LAB;  Service: Cardiovascular; inferior STEMI:  mid RCA 99% (BMS PCI), normal LVF, diffuse 20-30% distal RCA and 50% RPL/PAB.  Mid LCx 50-60%.  Mid LAD 30-40% at D1 with ostial D1 50%  . LEFT HEART CATHETERIZATION WITH CORONARY ANGIOGRAM N/A 07/01/2012   Procedure: LEFT HEART CATHETERIZATION WITH CORONARY ANGIOGRAM;  Surgeon: Peter M [name], MD;  Location: [facility] CATH LAB;  Service: Cardiovascular:: CLASS III ANGINA & + MYOVIEW (inf Ischemia) -- pRCA RCA 90% (BMS PCI), widely patent mid RCA BMS & diffuse 30-40% dist RCA, RPAV focal 80% (BMS PCI).  30% P-M LAD.  20% circumflex marginal.  EF 55-65%.  [name] LEFT HEART CATHETERIZATION WITH CORONARY ANGIOGRAM N/A 08/03/2013   Procedure: LEFT HEART CATHETERIZATION WITH CORONARY ANGIOGRAM;  Surgeon: Peter M [name], MD;  Location: [facility] CATH LAB;  Service: Cardiovascular;  Laterality: N/A;  nonobstuctive CAD, --  Mild LAD (mid 40%).  Mild circumflex.  Patent mid RCA and PAB stents with mild diffuse 30% disease elsewhere (~30% RPAV ISR).  Normal LV function..   . NM MYOVIEW LTD  06/25/2012   Dr. [name]: INTERMEDIATE RISK - w/ medium-sized, mild primary reversible basal to mid inferior perfusion defect suggestive of ischemia/  normal LV function and wall motion ,ef 63% --> lead to CATH & BMS PCI of  pRCA & RPAV.  [name] PERCUTANEOUS CORONARY STENT INTERVENTION  (PCI-S) N/A 02/15/2012   Procedure: PERCUTANEOUS CORONARY STENT INTERVENTION (PCI-S);  Surgeon: Peter M [name], MD;  Location: [facility] CATH LAB;  Service: Cardiovascular:: mRCA 99% -- Veriflex BMS 3.0 x 12 (3.75 mm) -insetting of inferior STEMI  . PERCUTANEOUS CORONARY STENT INTERVENTION (PCI-S)  07/01/2012   Procedure: PERCUTANEOUS CORONARY STENT INTERVENTION (PCI-S);  Surgeon: Peter M [name], MD;  Location: [facility] CATH LAB;  Service: Cardiovascular;;  pRCA PCI - Veriflex BMS 4.0 x 12; RPAV PCI - Veriflex BMS 3.0 x 12   . stents to heart x 2    . TRANSTHORACIC ECHOCARDIOGRAM  07/2017   Mod LVH, EF 50-55%, Gr 1 DD. mild PR and TR, trivial pericardial effusion (no change from 07/2016)  . TRANSURETHRAL RESECTION OF BLADDER TUMOR N/A 09/07/2016   Procedure: TRANSURETHRAL RESECTION OF BLADDER TUMOR (TURBT);  Surgeon: [name], MD;  [facility];  Service: Urology;  Laterality: N/A;  . TRANSURETHRAL RESECTION OF BLADDER TUMOR N/A 10/19/2016   Procedure: TRANSURETHRAL RESECTION OF BLADDER TUMOR (TURBT);  Surgeon: [name], MD;  Location[facility];  Service: Urology;  Laterality: N/A;  .  TRANSURETHRAL RESECTION OF BLADDER TUMOR N/A 03/15/2017   Procedure: TRANSURETHRAL RESECTION OF BLADDER TUMOR (TURBT);  Surgeon: [name], MD;  Location[facility];  Service: Urology;  Laterality: N/A;  . TRANSURETHRAL RESECTION OF BLADDER TUMOR N/A 06/06/2018   Procedure: TRANSURETHRAL RESECTION OF BLADDER TUMOR (TURBT);  Surgeon: [name], MD;  [facility];  Service: Urology;  Laterality: N/A;      Allergies: Patient has no known allergies.  Medications: Prior to Admission medications   Medication Sig Start Date End Date Taking? Authorizing Provider  albuterol (PROVENTIL HFA) 108 (90 BASE) MCG/ACT inhaler Inhale 2 puffs into the lungs 2 (two) times daily. Patient taking differently: Inhale 2 puffs into the lungs every 6 (six) hours as needed for wheezing or  shortness of breath.  05/06/14   Robbie Lis, MD  albuterol (PROVENTIL) (2.5 MG/3ML) 0.083% nebulizer solution Take 2.5 mg by nebulization every 6 (six) hours as needed for wheezing or shortness of breath.    [provider]  amLODipine (NORVASC) 10 MG tablet Take 10 mg by mouth daily.  04/07/17   [provider]  aspirin EC 81 MG tablet Take 81 mg by mouth daily.    [provider]  folic acid (FOLVITE) 1 MG tablet Take 1 mg by mouth daily. 04/20/18   [provider]  meloxicam (MOBIC) 15 MG tablet Take 15 mg by mouth daily.    [provider]  metFORMIN (GLUCOPHAGE) 500 MG tablet Take 500 mg by mouth 2 (two) times daily with a meal.     [provider]  nitroGLYCERIN (NITROSTAT) 0.4 MG SL tablet Place 1 tablet (0.4 mg total) under the tongue every 5 (five) minutes as needed for chest pain. 01/05/15   [name], PA  omeprazole (PRILOSEC) 20 MG capsule Take 1 capsule (20 mg total) by mouth 2 (two) times daily before a meal. Please schedule appointment for refills. 01/23/18   [name], Peter M, MD  phenazopyridine (PYRIDIUM) 200 MG tablet Take 1 tablet (200 mg total) by mouth 3 (three) times daily as needed for pain. 06/06/18   [name], MD  rosuvastatin (CRESTOR) 40 MG tablet Take 40 mg by mouth daily. 11/29/17   [provider]  SYMBICORT 160-4.5 MCG/ACT inhaler Inhale 2 puffs into the lungs 2 (two) times a day.  05/29/17   [provider]  tamsulosin (FLOMAX) 0.4 MG CAPS capsule Take 2 capsules (0.8 mg total) by mouth every evening. Patient taking differently: Take 0.4 mg by mouth daily after breakfast.  08/08/16   Waldron Session, MD  Tiotropium Bromide Monohydrate (SPIRIVA RESPIMAT) 2.5 MCG/ACT AERS 2 pffs each am 06/03/18   Tanda Rockers, MD  TOVIAZ 8 MG TB24 tablet Take 8 mg by mouth daily.  06/16/17   [provider]  traMADol (ULTRAM) 50 MG tablet Take 1 tablet (50 mg total) by mouth every 6 (six) hours as needed.  06/06/18 06/06/19  [name], MD  Vitamin D, Ergocalciferol, (DRISDOL) 50000 units CAPS capsule Take 50,000 Units by mouth once a week.  03/28/17   [provider]  budesonide (PULMICORT) 0.5 MG/2ML nebulizer solution Take 2 mLs (0.5 mg total) by nebulization 2 (two) times daily. DX:  496 02/07/11 03/21/11  Parrett, Fonnie Mu, NP  esomeprazole (NEXIUM) 40 MG capsule Take 1 capsule (40 mg total) by mouth daily. 12/28/10 03/21/11  Elsie Stain, MD     Vital Signs: Pulse 88   Temp 98.5 F (36.9  C) (Oral)   Resp 18   SpO2 98%   Physical Exam awake, alert.  Chest with distant breath sounds bilaterally.  Heart with normal rate, occasional ectopy noted.  Abdomen soft, positive bowel sounds, mildly tender anterior pelvic region to palpation.  Right nephrostomy tube intact draining yellow urine.  1+ pretibial edema bilaterally.  Imaging: No results found.  Labs:  CBC: Recent Labs    03/05/18 0838 04/03/18 1006 06/03/18 1010 06/06/18 1455  WBC 6.0 9.0 9.4 9.9  HGB 14.2 12.9* 12.7* 13.0  HCT 44.1 40.5 39.2 40.7  PLT 288 289 277 296    COAGS: Recent Labs    06/06/18 1455  INR 1.0    BMP: Recent Labs    03/05/18 0838 04/03/18 1006 06/03/18 1010 06/06/18 1455  NA 142 139 141 139  K 3.8 3.7 4.6 3.3*  CL 106 102 111 104  CO2 25 25 23 25   GLUCOSE 111* 173* 137* 146*  BUN 10 15 14 18   CALCIUM 9.1 8.8* 8.9 8.1*  CREATININE 0.96 1.09 1.52* 1.47*  GFRNONAA >60 >60 45* 47*  GFRAA >60 >60 52* 54*    LIVER FUNCTION TESTS: Recent Labs    07/26/17 1700 07/27/17 0413 10/30/17 0928 03/05/18 0838  BILITOT 0.6 0.6 0.4 0.5  AST 26 27 18 22   ALT 24 25 24 21   ALKPHOS 47 50 69 69  PROT 6.6 6.6 7.1 7.4  ALBUMIN 3.6 3.6 3.7 3.8    Assessment and Plan: Pt with history of diabetes, hyperlipidemia, coronary artery disease with prior MIs in 2011/ 2014, GERD, hypertension, COPD and stage III bladder carcinoma with prior TURBT and chemoradiation.  He underwent  cystoscopy/ureteroscopy on 06/06/18 with findings of cancer recurrence at right ureteral orifice.  A ureteral stent could not be placed.  He presents again today for right nephrostogram with attempted right ureteral stent placement.  Details/risks of procedure, including but not limited to, internal bleeding, infection, injury to adjacent structures, inability to place stent discussed with patient via interpreter with his understanding and consent.   Electronically Signed: D. Rowe Robert, PA-C 07/07/2018, 12:32 PM   I spent a total of 25 minutes at the the patient's bedside AND on the patient's hospital floor or unit, greater than 50% of which was counseling/coordinating care for right nephrostogram with possible ureteral stent placement

## 2018-07-08 ENCOUNTER — Other Ambulatory Visit: Payer: Self-pay | Admitting: Urology

## 2018-07-12 ENCOUNTER — Other Ambulatory Visit: Payer: Self-pay | Admitting: Nurse Practitioner

## 2018-07-14 ENCOUNTER — Encounter (HOSPITAL_COMMUNITY): Payer: Self-pay | Admitting: Interventional Radiology

## 2018-07-14 ENCOUNTER — Ambulatory Visit (HOSPITAL_COMMUNITY)
Admission: RE | Admit: 2018-07-14 | Discharge: 2018-07-14 | Disposition: A | Discharge status: Home / Self Care | Payer: Medicare HMO | Source: Ambulatory Visit | Attending: Interventional Radiology | Admitting: Interventional Radiology

## 2018-07-14 DIAGNOSIS — C678 Malignant neoplasm of overlapping sites of bladder: Secondary | ICD-10-CM | POA: Diagnosis not present

## 2018-07-14 DIAGNOSIS — Z436 Encounter for attention to other artificial openings of urinary tract: Secondary | ICD-10-CM | POA: Diagnosis not present

## 2018-07-14 HISTORY — PX: IR NEPHROSTOGRAM RIGHT THRU EXISTING ACCESS: IMG6062

## 2018-07-14 MED ORDER — IOHEXOL 300 MG/ML  SOLN
50.0000 mL | Freq: Once | INTRAMUSCULAR | Status: AC | PRN
  Administered 2018-07-14: 15 mL

## 2018-07-14 NOTE — Procedures (Signed)
S/p rt nephrostogram and pcn removal  Patent ureteral stent No comp Stable ebl min Full report in pacs

## 2018-07-14 NOTE — Addendum Note (Signed)
Addended by: [name] on: 07/14/2018 08:50 AM   Modules accepted: Orders

## 2018-07-29 ENCOUNTER — Other Ambulatory Visit (HOSPITAL_COMMUNITY): Payer: Medicare HMO

## 2018-07-30 ENCOUNTER — Encounter (HOSPITAL_COMMUNITY): Payer: Self-pay | Admitting: Physician Assistant

## 2018-07-30 ENCOUNTER — Encounter (HOSPITAL_COMMUNITY): Payer: Self-pay | Admitting: Anesthesiology

## 2018-07-30 NOTE — Progress Notes (Signed)
(  Epic)  07-27-17 ( ECHO,  04-03-18 (EKG) 06-01-18 Cardiac clearance from Dr. Ellyn Hack for his 06-06-18 surgery with Dr. [name] 06-03-18 Pulmonary clearance from Dr. Melvyn Novas for his 06-06-18 surgery with Dr. [name]

## 2018-07-30 NOTE — Patient Instructions (Addendum)
YOU HAD A COVID 19 TEST ON 07-31-18  @ 9:40 AM, PLEASE CONTINUE THE QUARANTINE INSTRUCTIONS AS OUTLINED IN YOUR HANDOUT.                [name]  07/30/2018   Your procedure is scheduled on:  08-01-18    Report to [facility] Main  Entrance    Report to Admitting at 10:15 AM    Call this number if you have problems the morning of surgery [phone]    Remember: No food after Midnight.      Take these medicines the morning of surgery with A SIP OF WATER:  Amlodipine (Norvasc), Omeprazole (Prilosec), Rosuvastatin (Crestor), and Toviaz. You may also use and bring your inhaler.    BRUSH YOUR TEETH MORNING OF SURGERY AND RINSE YOUR MOUTH OUT, NO CHEWING GUM CANDY OR MINTS.   DO NOT TAKE ANY DIABETIC MEDICATIONS DAY OF YOUR SURGERY                               You may not have any metal on your body including hair pins and              piercings     Do not wear jewelry, cologne, lotions, powders or deodorant                        Men may shave face and neck.   Do not bring valuables to the hospital. [place].  Contacts, dentures or bridgework may not be worn into surgery.    Patients discharged the day of surgery will not be allowed to drive home. IF YOU ARE HAVING SURGERY AND GOING HOME THE SAME DAY, YOU MUST HAVE AN ADULT TO DRIVE YOU HOME AND BE WITH YOU FOR 24 HOURS. YOU MAY GO HOME BY TAXI OR UBER OR ORTHERWISE, BUT AN ADULT MUST ACCOMPANY YOU HOME AND STAY WITH YOU FOR 24 HOURS.    Name and phone number of your driver: [name] [phone]  Special Instructions: N/A              Please read over the following fact sheets you were given: _____________________________________________________________________             How to Manage Your Diabetes Before and After Surgery  Why is it important to control my blood sugar before and after surgery? . Improving blood sugar levels before and after surgery helps  healing and can limit problems. . A way of improving blood sugar control is eating a healthy diet by: o  Eating less sugar and carbohydrates o  Increasing activity/exercise o  Talking with your doctor about reaching your blood sugar goals . High blood sugars (greater than 180 mg/dL) can raise your risk of infections and slow your recovery, so you will need to focus on controlling your diabetes during the weeks before surgery. . Make sure that the doctor who takes care of your diabetes knows about your planned surgery including the date and location.  How do I manage my blood sugar before surgery? . Check your blood sugar at least 4 times a day, starting 2 days before surgery, to make sure that the level is not too high or low. o Check your blood sugar the morning of your surgery when you  wake up and every 2 hours until you get to the Short Stay unit. . If your blood sugar is less than 70 mg/dL, you will need to treat for low blood sugar: o Do not take insulin. o Treat a low blood sugar (less than 70 mg/dL) with  cup of clear juice (cranberry or apple), 4 glucose tablets, OR glucose gel. o Recheck blood sugar in 15 minutes after treatment (to make sure it is greater than 70 mg/dL). If your blood sugar is not greater than 70 mg/dL on recheck, call [phone] for further instructions. . Report your blood sugar to the short stay nurse when you get to Short Stay.  . If you are admitted to the hospital after surgery: o Your blood sugar will be checked by the staff and you will probably be given insulin after surgery (instead of oral diabetes medicines) to make sure you have good blood sugar levels. o The goal for blood sugar control after surgery is 80-180 mg/dL.   WHAT DO I DO ABOUT MY DIABETES MEDICATION?  [name] Do not take oral diabetes medicines (pills) the morning of surgery.  . THE DAY BEFORE SURGERY, take your usual dose of Metformin              [place] - Preparing for  Surgery Before surgery, you can play an important role.  Because skin is not sterile, your skin needs to be as free of germs as possible.  You can reduce the number of germs on your skin by washing with CHG (chlorahexidine gluconate) soap before surgery.  CHG is an antiseptic cleaner which kills germs and bonds with the skin to continue killing germs even after washing. Please DO NOT use if you have an allergy to CHG or antibacterial soaps.  If your skin becomes reddened/irritated stop using the CHG and inform your nurse when you arrive at Short Stay. Do not shave (including legs and underarms) for at least 48 hours prior to the first CHG shower.  You may shave your face/neck. Please follow these instructions carefully:  1.  Shower with CHG Soap the night before surgery and the  morning of Surgery.  2.  If you choose to wash your hair, wash your hair first as usual with your  normal  shampoo.  3.  After you shampoo, rinse your hair and body thoroughly to remove the  shampoo.                           4.  Use CHG as you would any other liquid soap.  You can apply chg directly  to the skin and wash                       Gently with a scrungie or clean washcloth.  5.  Apply the CHG Soap to your body ONLY FROM THE NECK DOWN.   Do not use on face/ open                           Wound or open sores. Avoid contact with eyes, ears mouth and genitals (private parts).                       Wash face,  Genitals (private parts) with your normal soap.             6.  Wash thoroughly, paying  special attention to the area where your surgery  will be performed.  7.  Thoroughly rinse your body with warm water from the neck down.  8.  DO NOT shower/wash with your normal soap after using and rinsing off  the CHG Soap.                9.  Pat yourself dry with a clean towel.            10.  Wear clean pajamas.            11.  Place clean sheets on your bed the night of your first shower and do not  sleep with pets. Day  of Surgery : Do not apply any lotions/deodorants the morning of surgery.  Please wear clean clothes to the hospital/surgery center.  FAILURE TO FOLLOW THESE INSTRUCTIONS MAY RESULT IN THE CANCELLATION OF YOUR SURGERY PATIENT SIGNATURE_________________________________  NURSE SIGNATURE__________________________________  ________________________________________________________________________

## 2018-07-31 ENCOUNTER — Encounter (INDEPENDENT_AMBULATORY_CARE_PROVIDER_SITE_OTHER): Payer: Self-pay

## 2018-07-31 ENCOUNTER — Other Ambulatory Visit: Payer: Self-pay

## 2018-07-31 ENCOUNTER — Other Ambulatory Visit (HOSPITAL_COMMUNITY)
Admission: RE | Admit: 2018-07-31 | Discharge: 2018-07-31 | Disposition: A | Discharge status: Home / Self Care | Payer: Medicare HMO | Source: Ambulatory Visit | Attending: Urology | Admitting: Urology

## 2018-07-31 ENCOUNTER — Encounter (HOSPITAL_COMMUNITY)
Admission: RE | Admit: 2018-07-31 | Discharge: 2018-07-31 | Disposition: A | Discharge status: Home / Self Care | Payer: Medicare HMO | Source: Ambulatory Visit | Attending: Urology | Admitting: Urology

## 2018-07-31 ENCOUNTER — Encounter (HOSPITAL_COMMUNITY): Payer: Self-pay

## 2018-07-31 DIAGNOSIS — A4159 Other Gram-negative sepsis: Secondary | ICD-10-CM | POA: Diagnosis not present

## 2018-07-31 DIAGNOSIS — E119 Type 2 diabetes mellitus without complications: Secondary | ICD-10-CM | POA: Insufficient documentation

## 2018-07-31 DIAGNOSIS — I252 Old myocardial infarction: Secondary | ICD-10-CM | POA: Insufficient documentation

## 2018-07-31 DIAGNOSIS — Z955 Presence of coronary angioplasty implant and graft: Secondary | ICD-10-CM | POA: Insufficient documentation

## 2018-07-31 DIAGNOSIS — M545 Low back pain: Secondary | ICD-10-CM | POA: Diagnosis not present

## 2018-07-31 DIAGNOSIS — C679 Malignant neoplasm of bladder, unspecified: Secondary | ICD-10-CM | POA: Insufficient documentation

## 2018-07-31 DIAGNOSIS — Z79899 Other long term (current) drug therapy: Secondary | ICD-10-CM | POA: Insufficient documentation

## 2018-07-31 DIAGNOSIS — J449 Chronic obstructive pulmonary disease, unspecified: Secondary | ICD-10-CM | POA: Insufficient documentation

## 2018-07-31 DIAGNOSIS — K219 Gastro-esophageal reflux disease without esophagitis: Secondary | ICD-10-CM | POA: Insufficient documentation

## 2018-07-31 DIAGNOSIS — G4733 Obstructive sleep apnea (adult) (pediatric): Secondary | ICD-10-CM | POA: Insufficient documentation

## 2018-07-31 DIAGNOSIS — Z7982 Long term (current) use of aspirin: Secondary | ICD-10-CM | POA: Insufficient documentation

## 2018-07-31 DIAGNOSIS — Z87891 Personal history of nicotine dependence: Secondary | ICD-10-CM | POA: Insufficient documentation

## 2018-07-31 DIAGNOSIS — I10 Essential (primary) hypertension: Secondary | ICD-10-CM | POA: Insufficient documentation

## 2018-07-31 DIAGNOSIS — Z7951 Long term (current) use of inhaled steroids: Secondary | ICD-10-CM | POA: Insufficient documentation

## 2018-07-31 DIAGNOSIS — Z7984 Long term (current) use of oral hypoglycemic drugs: Secondary | ICD-10-CM | POA: Insufficient documentation

## 2018-07-31 DIAGNOSIS — E785 Hyperlipidemia, unspecified: Secondary | ICD-10-CM | POA: Insufficient documentation

## 2018-07-31 DIAGNOSIS — I2510 Atherosclerotic heart disease of native coronary artery without angina pectoris: Secondary | ICD-10-CM | POA: Insufficient documentation

## 2018-07-31 DIAGNOSIS — Z01812 Encounter for preprocedural laboratory examination: Secondary | ICD-10-CM | POA: Insufficient documentation

## 2018-07-31 LAB — BASIC METABOLIC PANEL
Anion gap: 10 (ref 5–15)
BUN: 17 mg/dL (ref 8–23)
CO2: 25 mmol/L (ref 22–32)
Calcium: 8.2 mg/dL — ABNORMAL LOW (ref 8.9–10.3)
Chloride: 103 mmol/L (ref 98–111)
Creatinine, Ser: 1.16 mg/dL (ref 0.61–1.24)
GFR calc Af Amer: >60 mL/min (ref >60)
GFR calc non Af Amer: >60 mL/min (ref >60)
Glucose, Bld: 108 mg/dL — ABNORMAL HIGH (ref 70–99)
Potassium: 4.7 mmol/L (ref 3.5–5.1)
Sodium: 138 mmol/L (ref 135–145)

## 2018-07-31 LAB — CBC
HCT: 37.8 % — ABNORMAL LOW (ref 39.0–52.0)
Hemoglobin: 11.4 g/dL — ABNORMAL LOW (ref 13.0–17.0)
MCH: 25.3 pg — ABNORMAL LOW (ref 26.0–34.0)
MCHC: 30.2 g/dL (ref 30.0–36.0)
MCV: 83.8 fL (ref 80.0–100.0)
Platelets: 298 10*3/uL (ref 150–400)
RBC: 4.51 MIL/uL (ref 4.22–5.81)
RDW: 15.6 % — ABNORMAL HIGH (ref 11.5–15.5)
WBC: 11.2 10*3/uL — ABNORMAL HIGH (ref 4.0–10.5)
nRBC: 0 % (ref 0.0–0.2)

## 2018-07-31 LAB — SARS CORONAVIRUS 2 (TAT 6-24 HRS): SARS Coronavirus 2: NEGATIVE

## 2018-07-31 LAB — HEMOGLOBIN A1C
Hgb A1c MFr Bld: 6.8 % — ABNORMAL HIGH (ref 4.8–5.6)
Mean Plasma Glucose: 148.46 mg/dL

## 2018-07-31 LAB — GLUCOSE, CAPILLARY: Glucose-Capillary: 107 mg/dL — ABNORMAL HIGH (ref 70–99)

## 2018-07-31 NOTE — Progress Notes (Signed)
Anesthesia Chart Review   Case: [id number] Date/Time: 08/01/18 1226   Procedures:      TRANSURETHRAL RESECTION OF BLADDER TUMOR (TURBT) (N/A )     CYSTOSCOPY WITH RETROGRADE PYELOGRAM/URETERAL STENT PLACEMENT (Bilateral )   Anesthesia type: General   Pre-op diagnosis: BLADDER CANCER   [facility] RO[facility] 03 [facility]   Surgeon: [name], MD      DISCUSSION:73 yo former smoker (50 pack years, quit 01/09/08) with h/o GERD, HLD,severeCOPD GOLD II, CAD (NSTEMI, BMS 7425), diastolic heart failure, HTN, DM II, bladder cancer scheduled for above procedure 08/01/2018 with Dr. [name].  Pt last seen by cardiologist, Dr. Glenetta Hew, 05/26/2018.  Per his note, "Robertson seems to be doing relatively well overall from a cardiac standpoint. He has his baseline dyspnea from COPD (which he calls asthma). I tried really hard to get an understanding of any progression of symptoms, and he really seems to feel as though this is stable dyspnea from his COPD.  Truthfully, I think his COPD is probably the highest risk for his surgery.  Further cardiac testing would not change management at this time. From a cardiac standpoint LOW RISK for an LOW RISK surgery.  Estimated Risk of MI, PE, VF/VT (Cardiac Arrest), Complete Heart Block:<1% Okay to hold aspirin if necessary."  Pt seen by pulmonologist, Dr. Christinia Gully, 05/27/18.  Per his note, "Considering this patient had no clue whatsoever as to how and when to use his medications he is actually doing fairly well without limiting dyspnea nor tendency to exacerbation at this point and so it is difficult to know what he needs long-term but for the short-term would recommend he be maintained on Lab[place] l[place] and ICS as per the AVS instructions.  I did confirm that he can read [name] and so gave him instructions in [name] to try to help him understand how and when t o use his various inhalers and have cleared him for bladder surgery at this point."  S/p  TURBT 06/06/2018 with no anesthesia complications noted.  Stable since last procedure.    Pt can proceed with planned procedure barring acute status change.   VS: BP (!) 163/77   Pulse 87   Temp 37 C (Oral)   Resp 16   Ht 5\' 1"  (1.549 m)   Wt 63.3 kg   SpO2 99%   BMI 26.38 kg/m   PROVIDERS: [name], MD is PCP   Glenetta Hew, MD is Cardiologist  Christinia Gully, MD is Pulmonologist  LABS: Labs reviewed: Acceptable for surgery. (all labs ordered are listed, but only abnormal results are displayed)  Labs Reviewed  HEMOGLOBIN A1C - Abnormal; Notable for the following components:      Result Value   Hgb A1c MFr Bld 6.8 (*)    All other components within normal limits  CBC - Abnormal; Notable for the following components:   WBC 11.2 (*)    Hemoglobin 11.4 (*)    HCT 37.8 (*)    MCH 25.3 (*)    RDW 15.6 (*)    All other components within normal limits  BASIC METABOLIC PANEL - Abnormal; Notable for the following components:   Glucose, Bld 108 (*)    Calcium 8.2 (*)    All other components within normal limits  GLUCOSE, CAPILLARY - Abnormal; Notable for the following components:   Glucose-Capillary 107 (*)    All other components within normal limits     IMAGES: Chest Xray 06/03/2018 FINDINGS: Lung volumes  are normal. No consolidative airspace disease. No pleural effusions. No pneumothorax. No pulmonary nodule or mass noted. Pulmonary vasculature and the cardiomediastinal silhouette are within normal limits.  IMPRESSION: No radiographic evidence of acute cardiopulmonary disease.   EKG: 04/03/2018 Rate 87 bpm Normal sinus rhythm Normal ECG No significant change since last tracing  CV: Echo 07/27/17 Study Conclusions  - Left ventricle: Wall thickness was increased in a pattern of moderate LVH. Systolic function was normal. The estimated ejection fraction was in the range of 50% to 55%. Doppler parameters are consistent with abnormal left  ventricular relaxation (grade 1 diastolic dysfunction). - Aortic valve: Sclerosis without stenosis. - Aorta: There was mild dilation of the sinus(es) of Valsalva. Aortic root dimension: 41 mm (ED). - Mitral valve: There was trivial regurgitation. - Right ventricle: Systolic function was normal. - Tricuspid valve: There was trivial regurgitation. - Pulmonic valve: There was no significant regurgitation. - Pericardium, extracardiac: A trivial pericardial effusion was identified.  Impressions:  - Unchanged from prior study.  Cardiac Cath 12/27/14  Ost LAD to Prox LAD lesion, 40% stenosed.  Ost 2nd Diag to 2nd Diag lesion, 80% stenosed.  Mid Cx lesion, 50% stenosed.  The left ventricular systolic function is normal.  Patent stents in the RCA.  Mild to moderate left sided disease.  Continue aggressive secondary prevention. He will be watched for bleeding at his radial site. He will then go back to [facility] with further w/u per the internal medicine team. Past Medical History:  Diagnosis Date  . Allergic rhinitis   . Aortic atherosclerosis ([place]) 08/03/2016   Noted on CXR  . CAD S/P BMS PCI (mid RCA, prox RCA & RPAV)    cardiologist-  Dr. [name]: a). NSTEMI/[place] 06/2009: Mod CAD & (-) Myoview; b). 02/2012: Inf STEMI s/p Veriflex BMS PCI (3.5 x 12 - 3.75 mm) -mid RCA. c). Exertional Angina / + MYOVIEW -->  BMS PCI x 2- prox RCA (Veriflex BMS 4.0 x 12), rPAV (3.0 x 12);; d) LAST CATH 12/2014: 40% ost LAD, 80% ostial D2, 50% midCx.  Patent RCA - RPAV/PL stents.  Medical Rx  . Centriacinar emphysema ([place])   . Chest pain x 1 in last 6 months  . COPD, group C, by GOLD 2017 classification [facility]) pulmonologist-- dr mcquid   former smoker--- severe OSA w/ hx multiple hospital admissions w/ exacerbation's  . Dysuria   . Emphysema lung ([place]) 08/03/2016   Noted on CXR  . Essential hypertension   . Full dentures   . GERD (gastroesophageal reflux disease)   . Grade I  diastolic dysfunction 29/56/2130   noted on ECHO  . Hematuria 04/02/2018  . History of cardiomegaly    Noted on CXR 07/28/2017  . History of non-ST elevation myocardial infarction (NSTEMI) 06/19/2009   in setting COPD w/ exacerbation;  negative myoview for ischemia;  previously had cardiac cath 06-08-2009 w/ diffuse nonobtructive CAD and  normal LVF  . History of prostatitis   . History of ST elevation myocardial infarction (STEMI) 02/15/2012   in setting of COPD w/ exacerbation; per cardiac cath PCI and stenting to RCA  . Hyperlipidemia   . Lower abdominal pain   . LVH (left ventricular hypertrophy) 07/27/2017   Moderate, noted on ECHO  . Pneumonia 07/28/2017   Noted on CXR   . Pulmonary nodules 03/05/2018   Stable 4 mm left upper lobe nodule  . 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 Type 2 diabetes mellitus ([place])   . Wears glasses     Past Surgical History:  Procedure Laterality Date  . CARDIAC CATHETERIZATION N/A 12/27/2014   Procedure: Left Heart Cath and Coronary Angiography;  Surgeon: Jettie Booze, MD;  Location: [place] CV LAB;  Service: Cardiovascular;  Laterality: N/A; Ost-prox LAD 40%, ost D2 (small) 80%, mCFx 50%, mild to moderate left sided disease, patent RCA (p, m & RPAV) stents, normal LVF  . COLONOSCOPY    . CYSTOSCOPY W/ RETROGRADES Bilateral 09/07/2016   Procedure: CYSTOSCOPY WITH BILATERAL RETROGRADE PYELOGRAM;  Surgeon: [name], MD;  [facility];  Service: Urology;  Laterality: Bilateral;  . CYSTOSCOPY W/ RETROGRADES Bilateral 03/15/2017   Procedure: CYSTOSCOPY WITH RETROGRADE PYELOGRAM;  Surgeon: [name], MD;  [facility]: [facility];  Service: Urology;  Laterality: Bilateral;  . CYSTOSCOPY WITH RETROGRADE PYELOGRAM, URETEROSCOPY AND STENT PLACEMENT Left 10/19/2016   Procedure:  CYSTOSCOPY WITH RETROGRADE PYELOGRAM, URETEROSCOPY AND STENT PLACEMENT;  Surgeon: [name], MD;  [facility];  Service: Urology;  Laterality: Left;  . CYSTOSCOPY WITH URETEROSCOPY Left 06/06/2018   Procedure: CYSTOSCOPY WITH LEFT RETROGRADE PYELOGRAM;  Surgeon: [name], MD;  [facility];  Service: Urology;  Laterality: Left;  . ESOPHAGOGASTRODUODENOSCOPY  2004  . IR NEPHROSTOGRAM RIGHT THRU EXISTING ACCESS  07/14/2018  . IR NEPHROSTOMY EXCHANGE RIGHT  07/07/2018  . IR NEPHROSTOMY PLACEMENT RIGHT  06/06/2018  . IR URETERAL STENT PLACEMENT EXISTING ACCESS RIGHT  07/07/2018  . LEFT HEART CATH AND CORONARY ANGIOGRAPHY  06/08/2009   Dr. Burt Knack: LAD 40-50%, LCX 40%, RCA 25% EF 65%m -thought to be related to coronary spasm.  [name] LEFT HEART CATHETERIZATION WITH CORONARY ANGIOGRAM N/A 02/15/2012   Procedure: LEFT HEART CATHETERIZATION WITH CORONARY ANGIOGRAM;  Surgeon: Peter M [name], MD;  Location: [facility] CATH LAB;  Service: Cardiovascular; inferior STEMI:  mid RCA 99% (BMS PCI), normal LVF, diffuse 20-30% distal RCA and 50% RPL/PAB.  Mid LCx 50-60%.  Mid LAD 30-40% at D1 with ostial D1 50%  . LEFT HEART CATHETERIZATION WITH CORONARY ANGIOGRAM N/A 07/01/2012   Procedure: LEFT HEART CATHETERIZATION WITH CORONARY ANGIOGRAM;  Surgeon: Peter M [name], MD;  Location: [facility] CATH LAB;  Service: Cardiovascular:: CLASS III ANGINA & + MYOVIEW (inf Ischemia) -- pRCA RCA 90% (BMS PCI), widely patent mid RCA BMS & diffuse 30-40% dist RCA, RPAV focal 80% (BMS PCI).  30% P-M LAD.  20% circumflex marginal.  EF 55-65%.  [name] LEFT HEART CATHETERIZATION WITH CORONARY ANGIOGRAM N/A 08/03/2013   Procedure: LEFT HEART CATHETERIZATION WITH CORONARY ANGIOGRAM;  Surgeon: Peter M [name], MD;  Location: [facility] CATH LAB;  Service: Cardiovascular;  Laterality: N/A;  nonobstuctive CAD, --  Mild LAD (mid 40%).  Mild circumflex.  Patent mid RCA and PAB stents with mild diffuse 30% disease elsewhere (~30% RPAV ISR).  Normal LV  function..   . NM MYOVIEW LTD  06/25/2012   Dr. [name]: INTERMEDIATE RISK - w/ medium-sized, mild primary reversible basal to mid inferior perfusion defect suggestive of ischemia/  normal LV function and wall motion ,ef 63% --> lead to CATH & BMS PCI of  pRCA & RPAV.  [name] PERCUTANEOUS CORONARY STENT INTERVENTION (PCI-S) N/A 02/15/2012   Procedure: PERCUTANEOUS CORONARY STENT INTERVENTION (PCI-S);  Surgeon: Peter M [name], MD;  Location: [facility] CATH LAB;  Service: Cardiovascular:: mRCA 99% -- Veriflex BMS 3.0 x 12 (3.75 mm) -insetting of  inferior STEMI  . PERCUTANEOUS CORONARY STENT INTERVENTION (PCI-S)  07/01/2012   Procedure: PERCUTANEOUS CORONARY STENT INTERVENTION (PCI-S);  Surgeon: Peter M [name], MD;  Location: [facility] CATH LAB;  Service: Cardiovascular;;  pRCA PCI - Veriflex BMS 4.0 x 12; RPAV PCI - Veriflex BMS 3.0 x 12   . stents to heart x 2    . TRANSTHORACIC ECHOCARDIOGRAM  07/2017   Mod LVH, EF 50-55%, Gr 1 DD. mild PR and TR, trivial pericardial effusion (no change from 07/2016)  . TRANSURETHRAL RESECTION OF BLADDER TUMOR N/A 09/07/2016   Procedure: TRANSURETHRAL RESECTION OF BLADDER TUMOR (TURBT);  Surgeon: [name], MD;  [facility];  Service: Urology;  Laterality: N/A;  . TRANSURETHRAL RESECTION OF BLADDER TUMOR N/A 10/19/2016   Procedure: TRANSURETHRAL RESECTION OF BLADDER TUMOR (TURBT);  Surgeon: [name], MD;  [facility];  Service: Urology;  Laterality: N/A;  . TRANSURETHRAL RESECTION OF BLADDER TUMOR N/A 03/15/2017   Procedure: TRANSURETHRAL RESECTION OF BLADDER TUMOR (TURBT);  Surgeon: [name], MD;  Location[facility];  Service: Urology;  Laterality: N/A;  . TRANSURETHRAL RESECTION OF BLADDER TUMOR N/A 06/06/2018   Procedure: TRANSURETHRAL RESECTION OF BLADDER TUMOR (TURBT);  Surgeon: [name], MD;  [facility];  Service: Urology;  Laterality: N/A;    MEDICATIONS: . albuterol (PROVENTIL HFA) 108 (90 BASE) MCG/ACT  inhaler  . albuterol (PROVENTIL) (2.5 MG/3ML) 0.083% nebulizer solution  . amLODipine (NORVASC) 10 MG tablet  . aspirin EC 81 MG tablet  . folic acid (FOLVITE) 1 MG tablet  . ibuprofen (ADVIL) 200 MG tablet  . meloxicam (MOBIC) 15 MG tablet  . metFORMIN (GLUCOPHAGE) 500 MG tablet  . nitroGLYCERIN (NITROSTAT) 0.4 MG SL tablet  . omeprazole (PRILOSEC) 20 MG capsule  . phenazopyridine (PYRIDIUM) 200 MG tablet  . potassium chloride SA (K-DUR) 20 MEQ tablet  . rosuvastatin (CRESTOR) 40 MG tablet  . SYMBICORT 160-4.5 MCG/ACT inhaler  . tamsulosin (FLOMAX) 0.4 MG CAPS capsule  . Tiotropium Bromide Monohydrate (SPIRIVA RESPIMAT) 2.5 MCG/ACT AERS  . TOVIAZ 8 MG TB24 tablet  . traMADol (ULTRAM) 50 MG tablet  . Vitamin D, Ergocalciferol, (DRISDOL) 50000 units CAPS capsule   No current facility-administered medications for this encounter.      Maia Plan [facility] Pre-Surgical Testing [phone] 07/31/18  1:35 PM

## 2018-07-31 NOTE — Anesthesia Preprocedure Evaluation (Deleted)
Anesthesia Evaluation    Airway        Dental   Pulmonary COPD,  COPD inhaler, former smoker (quit 2010),  07/31/2018 SARS coronavirus NEG          Cardiovascular hypertension, Pt. on medications + CAD, + Past MI and + Peripheral Vascular Disease    '19 ECHO: EF 50-55%, valves OK '16 Cath: non-obstructive ASCAD, medical management   Neuro/Psych    GI/Hepatic GERD  Medicated,  Endo/Other  diabetes (glu 122), Oral Hypoglycemic Agents  Renal/GU    Bladder tumor    Musculoskeletal   Abdominal   Peds  Hematology   Anesthesia Other Findings   Reproductive/Obstetrics                            Anesthesia Physical Anesthesia Plan  ASA:   Anesthesia Plan:    Post-op Pain Management:    Induction:   PONV Risk Score and Plan:   Airway Management Planned:   Additional Equipment:   Intra-op Plan:   Post-operative Plan:   Informed Consent:   Plan Discussed with:   Anesthesia Plan Comments: (See PAT note 07/31/2018, Konrad Felix, PA-C)        Anesthesia Quick Evaluation

## 2018-08-01 ENCOUNTER — Emergency Department (HOSPITAL_COMMUNITY): Payer: Medicare HMO

## 2018-08-01 ENCOUNTER — Inpatient Hospital Stay (HOSPITAL_COMMUNITY): Payer: Medicare HMO

## 2018-08-01 ENCOUNTER — Other Ambulatory Visit: Payer: Self-pay

## 2018-08-01 ENCOUNTER — Ambulatory Visit (HOSPITAL_COMMUNITY)
Admission: RE | Admit: 2018-08-01 | Discharge: 2018-08-01 | Disposition: A | Discharge status: Home / Self Care | Payer: Medicare HMO | Source: Home / Self Care | Attending: Urology | Admitting: Urology

## 2018-08-01 ENCOUNTER — Inpatient Hospital Stay (HOSPITAL_COMMUNITY)
Admission: EM | Admit: 2018-08-01 | Discharge: 2018-08-06 | DRG: 872 | Disposition: A | Discharge status: Home Health | Payer: Medicare HMO | Attending: Internal Medicine | Admitting: Internal Medicine

## 2018-08-01 ENCOUNTER — Encounter (HOSPITAL_COMMUNITY)
Admission: RE | Disposition: A | Discharge status: Home / Self Care | Payer: Self-pay | Source: Home / Self Care | Attending: Urology

## 2018-08-01 ENCOUNTER — Encounter (HOSPITAL_COMMUNITY): Payer: Self-pay

## 2018-08-01 DIAGNOSIS — A4150 Gram-negative sepsis, unspecified: Secondary | ICD-10-CM | POA: Diagnosis present

## 2018-08-01 DIAGNOSIS — N136 Pyonephrosis: Secondary | ICD-10-CM | POA: Diagnosis present

## 2018-08-01 DIAGNOSIS — Z7982 Long term (current) use of aspirin: Secondary | ICD-10-CM

## 2018-08-01 DIAGNOSIS — I2510 Atherosclerotic heart disease of native coronary artery without angina pectoris: Secondary | ICD-10-CM | POA: Insufficient documentation

## 2018-08-01 DIAGNOSIS — M545 Low back pain, unspecified: Secondary | ICD-10-CM

## 2018-08-01 DIAGNOSIS — I110 Hypertensive heart disease with heart failure: Secondary | ICD-10-CM | POA: Insufficient documentation

## 2018-08-01 DIAGNOSIS — N130 Hydronephrosis with ureteropelvic junction obstruction: Secondary | ICD-10-CM | POA: Insufficient documentation

## 2018-08-01 DIAGNOSIS — K769 Liver disease, unspecified: Secondary | ICD-10-CM

## 2018-08-01 DIAGNOSIS — Z791 Long term (current) use of non-steroidal anti-inflammatories (NSAID): Secondary | ICD-10-CM | POA: Insufficient documentation

## 2018-08-01 DIAGNOSIS — Z955 Presence of coronary angioplasty implant and graft: Secondary | ICD-10-CM | POA: Diagnosis not present

## 2018-08-01 DIAGNOSIS — Z7951 Long term (current) use of inhaled steroids: Secondary | ICD-10-CM | POA: Insufficient documentation

## 2018-08-01 DIAGNOSIS — K219 Gastro-esophageal reflux disease without esophagitis: Secondary | ICD-10-CM | POA: Insufficient documentation

## 2018-08-01 DIAGNOSIS — N390 Urinary tract infection, site not specified: Secondary | ICD-10-CM | POA: Diagnosis not present

## 2018-08-01 DIAGNOSIS — A419 Sepsis, unspecified organism: Secondary | ICD-10-CM

## 2018-08-01 DIAGNOSIS — Z5309 Procedure and treatment not carried out because of other contraindication: Secondary | ICD-10-CM | POA: Insufficient documentation

## 2018-08-01 DIAGNOSIS — Z7984 Long term (current) use of oral hypoglycemic drugs: Secondary | ICD-10-CM

## 2018-08-01 DIAGNOSIS — R1084 Generalized abdominal pain: Secondary | ICD-10-CM

## 2018-08-01 DIAGNOSIS — Z8551 Personal history of malignant neoplasm of bladder: Secondary | ICD-10-CM

## 2018-08-01 DIAGNOSIS — E7800 Pure hypercholesterolemia, unspecified: Secondary | ICD-10-CM | POA: Diagnosis present

## 2018-08-01 DIAGNOSIS — Z79899 Other long term (current) drug therapy: Secondary | ICD-10-CM | POA: Insufficient documentation

## 2018-08-01 DIAGNOSIS — Z87891 Personal history of nicotine dependence: Secondary | ICD-10-CM

## 2018-08-01 DIAGNOSIS — C787 Secondary malignant neoplasm of liver and intrahepatic bile duct: Secondary | ICD-10-CM | POA: Diagnosis present

## 2018-08-01 DIAGNOSIS — C7801 Secondary malignant neoplasm of right lung: Secondary | ICD-10-CM | POA: Diagnosis present

## 2018-08-01 DIAGNOSIS — E785 Hyperlipidemia, unspecified: Secondary | ICD-10-CM | POA: Diagnosis present

## 2018-08-01 DIAGNOSIS — I252 Old myocardial infarction: Secondary | ICD-10-CM

## 2018-08-01 DIAGNOSIS — B965 Pseudomonas (aeruginosa) (mallei) (pseudomallei) as the cause of diseases classified elsewhere: Secondary | ICD-10-CM | POA: Diagnosis present

## 2018-08-01 DIAGNOSIS — R160 Hepatomegaly, not elsewhere classified: Secondary | ICD-10-CM

## 2018-08-01 DIAGNOSIS — I10 Essential (primary) hypertension: Secondary | ICD-10-CM | POA: Diagnosis present

## 2018-08-01 DIAGNOSIS — C799 Secondary malignant neoplasm of unspecified site: Secondary | ICD-10-CM

## 2018-08-01 DIAGNOSIS — J449 Chronic obstructive pulmonary disease, unspecified: Secondary | ICD-10-CM | POA: Diagnosis present

## 2018-08-01 DIAGNOSIS — Z20828 Contact with and (suspected) exposure to other viral communicable diseases: Secondary | ICD-10-CM | POA: Diagnosis present

## 2018-08-01 DIAGNOSIS — A4159 Other Gram-negative sepsis: Principal | ICD-10-CM | POA: Diagnosis present

## 2018-08-01 DIAGNOSIS — E119 Type 2 diabetes mellitus without complications: Secondary | ICD-10-CM | POA: Diagnosis present

## 2018-08-01 DIAGNOSIS — G4733 Obstructive sleep apnea (adult) (pediatric): Secondary | ICD-10-CM | POA: Insufficient documentation

## 2018-08-01 DIAGNOSIS — I5030 Unspecified diastolic (congestive) heart failure: Secondary | ICD-10-CM | POA: Insufficient documentation

## 2018-08-01 DIAGNOSIS — J439 Emphysema, unspecified: Secondary | ICD-10-CM | POA: Insufficient documentation

## 2018-08-01 DIAGNOSIS — Z1159 Encounter for screening for other viral diseases: Secondary | ICD-10-CM | POA: Insufficient documentation

## 2018-08-01 DIAGNOSIS — C679 Malignant neoplasm of bladder, unspecified: Secondary | ICD-10-CM | POA: Insufficient documentation

## 2018-08-01 DIAGNOSIS — N3001 Acute cystitis with hematuria: Secondary | ICD-10-CM

## 2018-08-01 DIAGNOSIS — C672 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: Secondary | ICD-10-CM | POA: Diagnosis present

## 2018-08-01 DIAGNOSIS — E1169 Type 2 diabetes mellitus with other specified complication: Secondary | ICD-10-CM | POA: Diagnosis present

## 2018-08-01 LAB — CBC WITH DIFFERENTIAL/PLATELET
Abs Immature Granulocytes: 0.07 10*3/uL (ref 0.00–0.07)
Basophils Absolute: 0 10*3/uL (ref 0.0–0.1)
Basophils Relative: 0 %
Eosinophils Absolute: 0.3 10*3/uL (ref 0.0–0.5)
Eosinophils Relative: 2 %
HCT: 39.4 % (ref 39.0–52.0)
Hemoglobin: 12.3 g/dL — ABNORMAL LOW (ref 13.0–17.0)
Immature Granulocytes: 1 %
Lymphocytes Relative: 7 %
Lymphs Abs: 0.7 10*3/uL (ref 0.7–4.0)
MCH: 25.8 pg — ABNORMAL LOW (ref 26.0–34.0)
MCHC: 31.2 g/dL (ref 30.0–36.0)
MCV: 82.8 fL (ref 80.0–100.0)
Monocytes Absolute: 0.8 10*3/uL (ref 0.1–1.0)
Monocytes Relative: 7 %
Neutro Abs: 9.3 10*3/uL — ABNORMAL HIGH (ref 1.7–7.7)
Neutrophils Relative %: 83 %
Platelets: 267 10*3/uL (ref 150–400)
RBC: 4.76 MIL/uL (ref 4.22–5.81)
RDW: 15.9 % — ABNORMAL HIGH (ref 11.5–15.5)
WBC: 11.1 10*3/uL — ABNORMAL HIGH (ref 4.0–10.5)
nRBC: 0 % (ref 0.0–0.2)

## 2018-08-01 LAB — LIPASE, BLOOD: Lipase: 18 U/L (ref 11–51)

## 2018-08-01 LAB — COMPREHENSIVE METABOLIC PANEL
ALT: 56 U/L — ABNORMAL HIGH (ref 0–44)
AST: 42 U/L — ABNORMAL HIGH (ref 15–41)
Albumin: 3.3 g/dL — ABNORMAL LOW (ref 3.5–5.0)
Alkaline Phosphatase: 408 U/L — ABNORMAL HIGH (ref 38–126)
Anion gap: 13 (ref 5–15)
BUN: 16 mg/dL (ref 8–23)
CO2: 22 mmol/L (ref 22–32)
Calcium: 8.3 mg/dL — ABNORMAL LOW (ref 8.9–10.3)
Chloride: 101 mmol/L (ref 98–111)
Creatinine, Ser: 1.27 mg/dL — ABNORMAL HIGH (ref 0.61–1.24)
GFR calc Af Amer: >60 mL/min (ref >60)
GFR calc non Af Amer: 56 mL/min — ABNORMAL LOW (ref >60)
Glucose, Bld: 122 mg/dL — ABNORMAL HIGH (ref 70–99)
Potassium: 3.8 mmol/L (ref 3.5–5.1)
Sodium: 136 mmol/L (ref 135–145)
Total Bilirubin: 0.9 mg/dL (ref 0.3–1.2)
Total Protein: 7.6 g/dL (ref 6.5–8.1)

## 2018-08-01 LAB — URINALYSIS, ROUTINE W REFLEX MICROSCOPIC
Bilirubin Urine: NEGATIVE
Glucose, UA: NEGATIVE mg/dL
Ketones, ur: NEGATIVE mg/dL
Nitrite: POSITIVE — AB
Protein, ur: 30 mg/dL — AB
RBC / HPF: >50 RBC/hpf — ABNORMAL HIGH (ref 0–5)
Specific Gravity, Urine: 1.006 (ref 1.005–1.030)
WBC, UA: >50 WBC/hpf — ABNORMAL HIGH (ref 0–5)
pH: 8 (ref 5.0–8.0)

## 2018-08-01 LAB — GLUCOSE, CAPILLARY
Glucose-Capillary: 122 mg/dL — ABNORMAL HIGH (ref 70–99)
Glucose-Capillary: 107 mg/dL — ABNORMAL HIGH (ref 70–99)

## 2018-08-01 LAB — CBG MONITORING, ED: Glucose-Capillary: 97 mg/dL (ref 70–99)

## 2018-08-01 LAB — APTT: aPTT: 31 seconds (ref 24–36)

## 2018-08-01 LAB — TROPONIN I (HIGH SENSITIVITY)
Troponin I (High Sensitivity): 16 ng/L (ref <18)
Troponin I (High Sensitivity): 17 ng/L (ref <18)

## 2018-08-01 LAB — PROTIME-INR
INR: 1.1 (ref 0.8–1.2)
Prothrombin Time: 14.3 seconds (ref 11.4–15.2)

## 2018-08-01 LAB — LACTIC ACID, PLASMA: Lactic Acid, Venous: 1.4 mmol/L (ref 0.5–1.9)

## 2018-08-01 SURGERY — TURBT (TRANSURETHRAL RESECTION OF BLADDER TUMOR)
Anesthesia: General

## 2018-08-01 MED ORDER — TIOTROPIUM BROMIDE MONOHYDRATE 2.5 MCG/ACT IN AERS: 2.0000 | INHALATION_SPRAY | Freq: Every morning | RESPIRATORY_TRACT | Status: DC

## 2018-08-01 MED ORDER — TAMSULOSIN HCL 0.4 MG PO CAPS
0.4000 mg | ORAL_CAPSULE | Freq: Every day | ORAL | Status: DC
  Administered 2018-08-02 – 2018-08-06 (×5): 0.4 mg via ORAL
  Filled 2018-08-01 (×5): qty 1

## 2018-08-01 MED ORDER — ONDANSETRON HCL 4 MG/2ML IJ SOLN
4.0000 mg | Freq: Four times a day (QID) | INTRAMUSCULAR | Status: DC | PRN
  Administered 2018-08-01 – 2018-08-03 (×5): 4 mg via INTRAVENOUS
  Filled 2018-08-01 (×5): qty 2

## 2018-08-01 MED ORDER — FENTANYL CITRATE (PF) 100 MCG/2ML IJ SOLN
INTRAMUSCULAR | Status: AC
  Filled 2018-08-01: qty 2

## 2018-08-01 MED ORDER — POTASSIUM CHLORIDE CRYS ER 20 MEQ PO TBCR
20.0000 meq | EXTENDED_RELEASE_TABLET | Freq: Every day | ORAL | Status: DC
  Administered 2018-08-02 – 2018-08-06 (×5): 20 meq via ORAL
  Filled 2018-08-01 (×5): qty 1

## 2018-08-01 MED ORDER — IOHEXOL 350 MG/ML SOLN: 100.0000 mL | Freq: Once | INTRAVENOUS | Status: DC | PRN

## 2018-08-01 MED ORDER — SODIUM CHLORIDE 0.9 % IV SOLN
2.0000 g | Freq: Once | INTRAVENOUS | Status: AC
  Administered 2018-08-01: 2 g via INTRAVENOUS
  Filled 2018-08-01: qty 20

## 2018-08-01 MED ORDER — CEFAZOLIN SODIUM-DEXTROSE 2-4 GM/100ML-% IV SOLN
2.0000 g | INTRAVENOUS | Status: DC
  Filled 2018-08-01: qty 100

## 2018-08-01 MED ORDER — SODIUM CHLORIDE 0.9 % IV BOLUS
500.0000 mL | Freq: Once | INTRAVENOUS | Status: AC
  Administered 2018-08-01: 500 mL via INTRAVENOUS

## 2018-08-01 MED ORDER — NITROGLYCERIN 0.4 MG SL SUBL: 0.4000 mg | SUBLINGUAL_TABLET | SUBLINGUAL | Status: DC | PRN

## 2018-08-01 MED ORDER — ALBUTEROL SULFATE (2.5 MG/3ML) 0.083% IN NEBU: 2.5000 mg | INHALATION_SOLUTION | Freq: Four times a day (QID) | RESPIRATORY_TRACT | Status: DC | PRN

## 2018-08-01 MED ORDER — INSULIN ASPART 100 UNIT/ML ~~LOC~~ SOLN
0.0000 [IU] | Freq: Every day | SUBCUTANEOUS | Status: DC
  Administered 2018-08-03: 3 [IU] via SUBCUTANEOUS
  Filled 2018-08-01: qty 0.05

## 2018-08-01 MED ORDER — METRONIDAZOLE IN NACL 5-0.79 MG/ML-% IV SOLN
500.0000 mg | Freq: Once | INTRAVENOUS | Status: AC
  Administered 2018-08-01: 500 mg via INTRAVENOUS
  Filled 2018-08-01: qty 100

## 2018-08-01 MED ORDER — AMLODIPINE BESYLATE 10 MG PO TABS
10.0000 mg | ORAL_TABLET | Freq: Every day | ORAL | Status: DC
  Administered 2018-08-02 – 2018-08-06 (×5): 10 mg via ORAL
  Filled 2018-08-01 (×5): qty 1

## 2018-08-01 MED ORDER — ROSUVASTATIN CALCIUM 10 MG PO TABS
40.0000 mg | ORAL_TABLET | Freq: Every day | ORAL | Status: DC
  Administered 2018-08-02 – 2018-08-06 (×4): 40 mg via ORAL
  Filled 2018-08-01 (×4): qty 4

## 2018-08-01 MED ORDER — ONDANSETRON HCL 4 MG/2ML IJ SOLN
INTRAMUSCULAR | Status: AC
  Filled 2018-08-01: qty 2

## 2018-08-01 MED ORDER — SODIUM CHLORIDE 0.9 % IV SOLN
2.0000 g | INTRAVENOUS | Status: DC
  Administered 2018-08-02 – 2018-08-03 (×2): 2 g via INTRAVENOUS
  Filled 2018-08-01: qty 20
  Filled 2018-08-01: qty 2

## 2018-08-01 MED ORDER — MORPHINE SULFATE (PF) 2 MG/ML IV SOLN
2.0000 mg | INTRAVENOUS | Status: DC | PRN
  Administered 2018-08-01 – 2018-08-03 (×8): 2 mg via INTRAVENOUS
  Filled 2018-08-01 (×8): qty 1

## 2018-08-01 MED ORDER — ACETAMINOPHEN 325 MG PO TABS
650.0000 mg | ORAL_TABLET | Freq: Once | ORAL | Status: AC
  Administered 2018-08-01: 650 mg via ORAL
  Filled 2018-08-01: qty 2

## 2018-08-01 MED ORDER — IBUPROFEN 200 MG PO TABS
400.0000 mg | ORAL_TABLET | Freq: Two times a day (BID) | ORAL | Status: DC
  Administered 2018-08-01 – 2018-08-06 (×9): 400 mg via ORAL
  Filled 2018-08-01 (×9): qty 2

## 2018-08-01 MED ORDER — METRONIDAZOLE IN NACL 5-0.79 MG/ML-% IV SOLN
500.0000 mg | Freq: Three times a day (TID) | INTRAVENOUS | Status: DC
  Administered 2018-08-01 – 2018-08-02 (×3): 500 mg via INTRAVENOUS
  Filled 2018-08-01 (×3): qty 100

## 2018-08-01 MED ORDER — FOLIC ACID 1 MG PO TABS
1.0000 mg | ORAL_TABLET | Freq: Every day | ORAL | Status: DC
  Administered 2018-08-02 – 2018-08-06 (×5): 1 mg via ORAL
  Filled 2018-08-01 (×5): qty 1

## 2018-08-01 MED ORDER — ACETAMINOPHEN 650 MG RE SUPP: 650.0000 mg | Freq: Four times a day (QID) | RECTAL | Status: DC | PRN

## 2018-08-01 MED ORDER — SODIUM CHLORIDE 0.9 % IV SOLN
INTRAVENOUS | Status: DC | PRN
  Administered 2018-08-01 – 2018-08-06 (×2): 250 mL via INTRAVENOUS

## 2018-08-01 MED ORDER — SODIUM CHLORIDE (PF) 0.9 % IJ SOLN
INTRAMUSCULAR | Status: AC
  Filled 2018-08-01: qty 50

## 2018-08-01 MED ORDER — ENOXAPARIN SODIUM 40 MG/0.4ML ~~LOC~~ SOLN
40.0000 mg | SUBCUTANEOUS | Status: DC
  Administered 2018-08-01: 40 mg via SUBCUTANEOUS
  Filled 2018-08-01: qty 0.4

## 2018-08-01 MED ORDER — CIPROFLOXACIN IN D5W 400 MG/200ML IV SOLN
400.0000 mg | INTRAVENOUS | Status: DC
  Filled 2018-08-01: qty 200

## 2018-08-01 MED ORDER — UMECLIDINIUM BROMIDE 62.5 MCG/INH IN AEPB
1.0000 | INHALATION_SPRAY | Freq: Every day | RESPIRATORY_TRACT | Status: DC
  Administered 2018-08-02 – 2018-08-06 (×5): 1 via RESPIRATORY_TRACT
  Filled 2018-08-01: qty 7

## 2018-08-01 MED ORDER — ONDANSETRON HCL 4 MG PO TABS: 4.0000 mg | ORAL_TABLET | Freq: Four times a day (QID) | ORAL | Status: DC | PRN

## 2018-08-01 MED ORDER — PROPOFOL 10 MG/ML IV BOLUS
INTRAVENOUS | Status: AC
  Filled 2018-08-01: qty 20

## 2018-08-01 MED ORDER — ASPIRIN EC 81 MG PO TBEC
81.0000 mg | DELAYED_RELEASE_TABLET | Freq: Every day | ORAL | Status: DC
  Administered 2018-08-02 – 2018-08-06 (×4): 81 mg via ORAL
  Filled 2018-08-01 (×4): qty 1

## 2018-08-01 MED ORDER — INSULIN ASPART 100 UNIT/ML ~~LOC~~ SOLN
0.0000 [IU] | Freq: Three times a day (TID) | SUBCUTANEOUS | Status: DC
  Administered 2018-08-02 – 2018-08-03 (×2): 2 [IU] via SUBCUTANEOUS
  Administered 2018-08-05 (×2): 1 [IU] via SUBCUTANEOUS
  Administered 2018-08-06: 12:00:00 2 [IU] via SUBCUTANEOUS
  Filled 2018-08-01: qty 0.09

## 2018-08-01 MED ORDER — IOHEXOL 300 MG/ML  SOLN
100.0000 mL | Freq: Once | INTRAMUSCULAR | Status: AC | PRN
  Administered 2018-08-01: 100 mL via INTRAVENOUS

## 2018-08-01 MED ORDER — LIDOCAINE 2% (20 MG/ML) 5 ML SYRINGE
INTRAMUSCULAR | Status: AC
  Filled 2018-08-01: qty 5

## 2018-08-01 MED ORDER — MOMETASONE FURO-FORMOTEROL FUM 200-5 MCG/ACT IN AERO
2.0000 | INHALATION_SPRAY | Freq: Two times a day (BID) | RESPIRATORY_TRACT | Status: DC
  Administered 2018-08-02 – 2018-08-06 (×8): 2 via RESPIRATORY_TRACT
  Filled 2018-08-01: qty 8.8

## 2018-08-01 MED ORDER — ALBUTEROL SULFATE HFA 108 (90 BASE) MCG/ACT IN AERS: 2.0000 | INHALATION_SPRAY | Freq: Four times a day (QID) | RESPIRATORY_TRACT | Status: DC | PRN

## 2018-08-01 MED ORDER — ACETAMINOPHEN 325 MG PO TABS
650.0000 mg | ORAL_TABLET | Freq: Four times a day (QID) | ORAL | Status: DC | PRN
  Administered 2018-08-04: 650 mg via ORAL
  Filled 2018-08-01: qty 2

## 2018-08-01 MED ORDER — DEXAMETHASONE SODIUM PHOSPHATE 10 MG/ML IJ SOLN
INTRAMUSCULAR | Status: AC
  Filled 2018-08-01: qty 1
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
## 2018-08-01 NOTE — Progress Notes (Signed)
Patient ID: [name], male   DOB: 1945-09-24, 73 y.o.   MRN: [id number]   Interventional Radiology Brief Note  Patient brought to IR and I examined his abdomen and performed a bedside US.  Very unfortunately he las innumerable solid metastases throughout his liver.  The largest which was described as potentially an abscess on his CT scan earlier today appears to be a solid mass on [place].  No internal fluid component to suggest abscess. Patient not acutely tender over the liver.    There is nothing to drain.  We will return patient to ER.   Signed,  Criselda Peaches, MD
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
## 2018-08-01 NOTE — H&P (Signed)
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 Left malig hydro at time. Treated with XRT by Lisbeth Renshaw 60Gy to pelvis 2019 + Carbopalstin by [facility], Completed therapy at the end of June 2019   06/2017 - CT -His most recent CT scans that showed no evidence of disease. There is also no longer any left-sided hydroureteronephrosis.   03/2018: The patient had a CT scan which was performed by Dr. [name]. This demonstrated some bladder wall thickening with an irregular wall thickening in the posterior bladder wall at the right UVJ. Otherwise, it was largely unremarkable demonstrating no clear evidence of systemic disease.   06/2018: TURBT demonstrated recurrent muscle invasive TCC at right bladder neck/trigone. Unable to access right UO, admitted for right PCN.   PMH sig for IDDM2, He is [name], his daughter is very involved in his care.   Intv: Patient follows-up today for post-op check and discussion of his recent surgery. He now has a right nephrostomy tube. The patient states that he is feeling better, continues to recover from the surgery. He has had a little bit of ongoing hematuria. He is tolerating the for ostomy to well.     ALLERGIES: None   MEDICATIONS: Omeprazole 20 mg capsule,delayed release  Sildenafil Citrate 100 mg tablet 1 tablet PO Daily  Toviaz 8 mg tablet, extended release 24 hr  Acetaminophen 500 mg tablet  Amlodipine Besylate 10 mg tablet tablet  Aspirin Ec 81 mg tablet, delayed release  Folic Acid  Losartan Potassium 50 mg tablet  Nitroglycerin 0.4 mg tablet, sublingual  Pyridium 200 mg tablet 1 tablet PO Q 8 H PRN  Spiriva  Symbicort 160 mcg-4.5 mcg/actuation hfa aerosol with adapter  Tramadol Hcl 50 mg tablet 1-2 tablet PO Q8 PRN     GU PSH: Bladder Instill AntiCA Agent - 12/19/2016, 11/28/2016, 11/21/2016 Cysto Remove Stent FB Sim - 03/11/2017 Cystoscopy - 03/11/2018, 10/29/2017, 08/13/2016 Cystoscopy Insert Stent, Left - 10/19/2016 Cystoscopy  TURBT >5 cm - 03/15/2017 Cystoscopy TURBT 2-5 cm - 06/06/2018, 10/19/2016, 09/07/2016 Cystoscopy Ureteroscopy, Left - 10/19/2016 Locm 300-399Mg /Ml Iodine,1Ml - 03/04/2017      PSH Notes: heart stent   NON-GU PSH: None   GU PMH: 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   Tobacco Use Assessment Completed: Used Tobacco in last 30 days? Has never drank.  Drinks 1 caffeinated drink per day.    REVIEW OF SYSTEMS:    GU Review Male:   Patient reports burning/ pain with urination. Patient denies frequent urination, hard to postpone urination, get up at night to urinate, leakage of urine, stream starts and stops, trouble starting your stream, have to strain to urinate , erection problems, and penile pain.  Gastrointestinal (Upper):   Patient denies nausea, vomiting, and indigestion/ heartburn.  Gastrointestinal (Lower):   Patient denies diarrhea and constipation.  Constitutional:   Patient denies fever, night sweats, weight loss, and fatigue.  Skin:   Patient denies itching and skin rash/ lesion.  Eyes:   Patient denies blurred vision and double vision.  Ears/ Nose/  Throat:   Patient denies sore throat and sinus problems.  Hematologic/Lymphatic:   Patient denies swollen glands and easy bruising.  Cardiovascular:   Patient denies leg swelling and chest pains.  Respiratory:   Patient denies cough and shortness of breath.  Endocrine:   Patient denies excessive thirst.   Musculoskeletal:   Patient denies back pain and joint pain.  Neurological:   Patient denies headaches and dizziness.  Psychologic:   Patient denies depression and anxiety.   VITAL SIGNS:      06/23/2018 01:22 PM  Weight 154 lb / 69.85 kg  Height 61 in / 154.94 cm  BP 174/101 mmHg  Pulse 99 /min  Temperature 98.8 F / 37.1 C  BMI 29.1 kg/m   GU PHYSICAL EXAMINATION:      Notes: The neph to site is clean/dry/intact, and is draining straw-colored urine   MULTI-SYSTEM PHYSICAL EXAMINATION:    Constitutional: Well-nourished. No physical deformities. Normally developed. Good grooming.  Respiratory: Normal breath sounds. No labored breathing, no use of accessory muscles.   Cardiovascular: Regular rate and rhythm. No murmur, no gallop. Normal temperature, normal extremity pulses, no swelling, no varicosities.      PAST DATA REVIEWED:  Source Of History:  Patient  Records Review:   Pathology Reports, Previous Doctor Records, Previous Patient Records, POC Tool  X-Ray Review: C.T. Abdomen/Pelvis: Reviewed Films. Discussed With Patient.     PROCEDURES:          Urinalysis w/Scope Dipstick Dipstick Cont'd Micro  Color: Yellow Bilirubin: Neg mg/dL WBC/hpf: 6 - 10/hpf  Appearance: Clear Ketones: Neg mg/dL RBC/hpf: 0 - 2/hpf  Specific Gravity: <=1.005 Blood: 3+ ery/uL Bacteria: Rare (0-9/hpf)  pH: <=5.0 Protein: Neg mg/dL Cystals: NS (Not Seen)  Glucose: Neg mg/dL Urobilinogen: 0.2 mg/dL Casts: NS (Not Seen)    Nitrites: Neg Trichomonas: Not Present    Leukocyte Esterase: 1+ leu/uL Mucous: Not Present      Epithelial Cells: 0 - 5/hpf      Yeast: NS (Not Seen)      Sperm: Not Present    Notes: MICROSCOPIC NOT CONCENTRATED    ASSESSMENT:      ICD-10 Details  1 GU:   Bladder Cancer overlapping sites - C67.8   2   Hydronephrosis - N13.0    PLAN:           Orders Labs Urine Culture  X-Rays: Interventional Radiology With and Without I.V. Contrast - Please evaluate for placement  of ureteral stent and removal of nephrostomy tube.          Schedule         Document Letter(s):  Created for Patient: Clinical Summary         Notes:   Plan to internalize his stent in his right kidney with interventional Radiology. We will then proceed to OR in ~1 month to re-resect his tumor in the area of the right trigone and change his stent.  The patient and I discussed this with his daughter (over the phone). He has agreed to proceed with this at least initially, but will call back to confirm. We will definitely schedule the stent placement, I will wait for them to contact us to schedule the re-resection of the tumor.
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
## 2018-08-01 NOTE — Progress Notes (Signed)
Pt transferred to the ER. Report given to [facility]. Pt taken to room 8. See Dr. [name] note for more.
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
## 2018-08-02 DIAGNOSIS — C672 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: Secondary | ICD-10-CM

## 2018-08-02 DIAGNOSIS — C799 Secondary malignant neoplasm of unspecified site: Secondary | ICD-10-CM

## 2018-08-02 DIAGNOSIS — K219 Gastro-esophageal reflux disease without esophagitis: Secondary | ICD-10-CM

## 2018-08-02 DIAGNOSIS — C679 Malignant neoplasm of bladder, unspecified: Secondary | ICD-10-CM

## 2018-08-02 DIAGNOSIS — E1169 Type 2 diabetes mellitus with other specified complication: Secondary | ICD-10-CM

## 2018-08-02 DIAGNOSIS — E785 Hyperlipidemia, unspecified: Secondary | ICD-10-CM

## 2018-08-02 DIAGNOSIS — I10 Essential (primary) hypertension: Secondary | ICD-10-CM

## 2018-08-02 LAB — CBC
HCT: 36.5 % — ABNORMAL LOW (ref 39.0–52.0)
Hemoglobin: 10.9 g/dL — ABNORMAL LOW (ref 13.0–17.0)
MCH: 25.1 pg — ABNORMAL LOW (ref 26.0–34.0)
MCHC: 29.9 g/dL — ABNORMAL LOW (ref 30.0–36.0)
MCV: 84.1 fL (ref 80.0–100.0)
Platelets: 256 10*3/uL (ref 150–400)
RBC: 4.34 MIL/uL (ref 4.22–5.81)
RDW: 15.7 % — ABNORMAL HIGH (ref 11.5–15.5)
WBC: 11.1 10*3/uL — ABNORMAL HIGH (ref 4.0–10.5)
nRBC: 0 % (ref 0.0–0.2)

## 2018-08-02 LAB — BASIC METABOLIC PANEL
Anion gap: 10 (ref 5–15)
BUN: 21 mg/dL (ref 8–23)
CO2: 21 mmol/L — ABNORMAL LOW (ref 22–32)
Calcium: 7.6 mg/dL — ABNORMAL LOW (ref 8.9–10.3)
Chloride: 105 mmol/L (ref 98–111)
Creatinine, Ser: 1.3 mg/dL — ABNORMAL HIGH (ref 0.61–1.24)
GFR calc Af Amer: >60 mL/min (ref >60)
GFR calc non Af Amer: 54 mL/min — ABNORMAL LOW (ref >60)
Glucose, Bld: 89 mg/dL (ref 70–99)
Potassium: 4 mmol/L (ref 3.5–5.1)
Sodium: 136 mmol/L (ref 135–145)

## 2018-08-02 LAB — GLUCOSE, CAPILLARY
Glucose-Capillary: 69 mg/dL — ABNORMAL LOW (ref 70–99)
Glucose-Capillary: 109 mg/dL — ABNORMAL HIGH (ref 70–99)
Glucose-Capillary: 98 mg/dL (ref 70–99)
Glucose-Capillary: 182 mg/dL — ABNORMAL HIGH (ref 70–99)
Glucose-Capillary: 103 mg/dL — ABNORMAL HIGH (ref 70–99)

## 2018-08-02 LAB — PROTIME-INR
INR: 1.2 (ref 0.8–1.2)
Prothrombin Time: 15.1 seconds (ref 11.4–15.2)

## 2018-08-02 MED ORDER — ENOXAPARIN SODIUM 40 MG/0.4ML ~~LOC~~ SOLN
40.0000 mg | SUBCUTANEOUS | Status: AC
  Administered 2018-08-02: 40 mg via SUBCUTANEOUS
  Filled 2018-08-02: qty 0.4

## 2018-08-02 MED ORDER — SENNOSIDES-DOCUSATE SODIUM 8.6-50 MG PO TABS
1.0000 | ORAL_TABLET | Freq: Two times a day (BID) | ORAL | Status: DC
  Administered 2018-08-02 – 2018-08-06 (×9): 1 via ORAL
  Filled 2018-08-02 (×9): qty 1

## 2018-08-02 NOTE — Progress Notes (Signed)
[name] admitted to room 1505 for sepsis due to UTI. This patient is anxious and irritable, and has  limited [name].  Unable to complete this Patient H&P at this time. With the use of the [employer] Video Interpreter this nurse was able to review plan of care for the patient concerning safety, comfort and treatment for his UTI. After medicating [name] for comfort, this patient was unableto stay awake to complete his H&P. Will continue to monitor and assess this patient needs and concerns
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
## 2018-08-02 NOTE — Progress Notes (Addendum)
Hypoglycemic Event  CBG: 69   Treatment: pt able to tolerate po, juice given. Will recheck in 15 minutes per protocol.   Symptoms: n/a pt alert/oriented  Follow-up CBG: Time: 0805 CBG Result: 109   Possible Reasons for Event: decreased PO intake      [name]
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
## 2018-08-02 NOTE — Progress Notes (Signed)
RT talked to patient about wearing a CPAP. RT explained what it was for the best she could. He said no once I explained it to hi. He stated he would be okay.

## 2018-08-03 LAB — COMPREHENSIVE METABOLIC PANEL
ALT: 40 U/L (ref 0–44)
AST: 44 U/L — ABNORMAL HIGH (ref 15–41)
Albumin: 3 g/dL — ABNORMAL LOW (ref 3.5–5.0)
Alkaline Phosphatase: 365 U/L — ABNORMAL HIGH (ref 38–126)
Anion gap: 8 (ref 5–15)
BUN: 22 mg/dL (ref 8–23)
CO2: 24 mmol/L (ref 22–32)
Calcium: 8.2 mg/dL — ABNORMAL LOW (ref 8.9–10.3)
Chloride: 105 mmol/L (ref 98–111)
Creatinine, Ser: 1.31 mg/dL — ABNORMAL HIGH (ref 0.61–1.24)
GFR calc Af Amer: >60 mL/min (ref >60)
GFR calc non Af Amer: 54 mL/min — ABNORMAL LOW (ref >60)
Glucose, Bld: 98 mg/dL (ref 70–99)
Potassium: 3.6 mmol/L (ref 3.5–5.1)
Sodium: 137 mmol/L (ref 135–145)
Total Bilirubin: 0.8 mg/dL (ref 0.3–1.2)
Total Protein: 6.9 g/dL (ref 6.5–8.1)

## 2018-08-03 LAB — URINE CULTURE: Culture: >=100000 — AB

## 2018-08-03 LAB — CBC WITH DIFFERENTIAL/PLATELET
Abs Immature Granulocytes: 0.05 10*3/uL (ref 0.00–0.07)
Basophils Absolute: 0 10*3/uL (ref 0.0–0.1)
Basophils Relative: 0 %
Eosinophils Absolute: 0.3 10*3/uL (ref 0.0–0.5)
Eosinophils Relative: 3 %
HCT: 36.3 % — ABNORMAL LOW (ref 39.0–52.0)
Hemoglobin: 11 g/dL — ABNORMAL LOW (ref 13.0–17.0)
Immature Granulocytes: 1 %
Lymphocytes Relative: 6 %
Lymphs Abs: 0.6 10*3/uL — ABNORMAL LOW (ref 0.7–4.0)
MCH: 25.1 pg — ABNORMAL LOW (ref 26.0–34.0)
MCHC: 30.3 g/dL (ref 30.0–36.0)
MCV: 82.9 fL (ref 80.0–100.0)
Monocytes Absolute: 0.8 10*3/uL (ref 0.1–1.0)
Monocytes Relative: 9 %
Neutro Abs: 7.6 10*3/uL (ref 1.7–7.7)
Neutrophils Relative %: 81 %
Platelets: 301 10*3/uL (ref 150–400)
RBC: 4.38 MIL/uL (ref 4.22–5.81)
RDW: 15.6 % — ABNORMAL HIGH (ref 11.5–15.5)
WBC: 9.3 10*3/uL (ref 4.0–10.5)
nRBC: 0 % (ref 0.0–0.2)

## 2018-08-03 LAB — GLUCOSE, CAPILLARY
Glucose-Capillary: 96 mg/dL (ref 70–99)
Glucose-Capillary: 109 mg/dL — ABNORMAL HIGH (ref 70–99)
Glucose-Capillary: 170 mg/dL — ABNORMAL HIGH (ref 70–99)
Glucose-Capillary: 253 mg/dL — ABNORMAL HIGH (ref 70–99)

## 2018-08-03 LAB — MAGNESIUM: Magnesium: 2.2 mg/dL (ref 1.7–2.4)

## 2018-08-03 MED ORDER — CARVEDILOL 3.125 MG PO TABS
3.1250 mg | ORAL_TABLET | Freq: Two times a day (BID) | ORAL | Status: DC
  Administered 2018-08-03 – 2018-08-06 (×6): 3.125 mg via ORAL
  Filled 2018-08-03 (×6): qty 1

## 2018-08-03 MED ORDER — CIPROFLOXACIN IN D5W 400 MG/200ML IV SOLN
400.0000 mg | Freq: Two times a day (BID) | INTRAVENOUS | Status: DC
  Administered 2018-08-03 – 2018-08-05 (×6): 400 mg via INTRAVENOUS
  Filled 2018-08-03 (×7): qty 200

## 2018-08-03 MED ORDER — PANTOPRAZOLE SODIUM 40 MG PO TBEC
40.0000 mg | DELAYED_RELEASE_TABLET | Freq: Every day | ORAL | Status: DC
  Administered 2018-08-04 – 2018-08-06 (×3): 40 mg via ORAL
  Filled 2018-08-03 (×3): qty 1

## 2018-08-03 MED ORDER — OXYCODONE HCL 5 MG PO TABS
5.0000 mg | ORAL_TABLET | ORAL | Status: DC | PRN
  Administered 2018-08-03 – 2018-08-06 (×4): 5 mg via ORAL
  Filled 2018-08-03 (×4): qty 1

## 2018-08-03 MED ORDER — MORPHINE SULFATE (PF) 2 MG/ML IV SOLN
2.0000 mg | INTRAVENOUS | Status: DC | PRN
  Administered 2018-08-03 – 2018-08-05 (×4): 2 mg via INTRAVENOUS
  Filled 2018-08-03 (×4): qty 1
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
## 2018-08-04 ENCOUNTER — Inpatient Hospital Stay (HOSPITAL_COMMUNITY): Payer: Medicare HMO

## 2018-08-04 LAB — COMPREHENSIVE METABOLIC PANEL
ALT: 46 U/L — ABNORMAL HIGH (ref 0–44)
AST: 73 U/L — ABNORMAL HIGH (ref 15–41)
Albumin: 2.6 g/dL — ABNORMAL LOW (ref 3.5–5.0)
Alkaline Phosphatase: 457 U/L — ABNORMAL HIGH (ref 38–126)
Anion gap: 12 (ref 5–15)
BUN: 21 mg/dL (ref 8–23)
CO2: 22 mmol/L (ref 22–32)
Calcium: 7.9 mg/dL — ABNORMAL LOW (ref 8.9–10.3)
Chloride: 103 mmol/L (ref 98–111)
Creatinine, Ser: 1.25 mg/dL — ABNORMAL HIGH (ref 0.61–1.24)
GFR calc Af Amer: >60 mL/min (ref >60)
GFR calc non Af Amer: 57 mL/min — ABNORMAL LOW (ref >60)
Glucose, Bld: 102 mg/dL — ABNORMAL HIGH (ref 70–99)
Potassium: 3.9 mmol/L (ref 3.5–5.1)
Sodium: 137 mmol/L (ref 135–145)
Total Bilirubin: 0.6 mg/dL (ref 0.3–1.2)
Total Protein: 6.3 g/dL — ABNORMAL LOW (ref 6.5–8.1)

## 2018-08-04 LAB — CBC WITH DIFFERENTIAL/PLATELET
Abs Immature Granulocytes: 0.05 10*3/uL (ref 0.00–0.07)
Basophils Absolute: 0 10*3/uL (ref 0.0–0.1)
Basophils Relative: 0 %
Eosinophils Absolute: 0.2 10*3/uL (ref 0.0–0.5)
Eosinophils Relative: 2 %
HCT: 35.4 % — ABNORMAL LOW (ref 39.0–52.0)
Hemoglobin: 10.7 g/dL — ABNORMAL LOW (ref 13.0–17.0)
Immature Granulocytes: 1 %
Lymphocytes Relative: 6 %
Lymphs Abs: 0.6 10*3/uL — ABNORMAL LOW (ref 0.7–4.0)
MCH: 25.4 pg — ABNORMAL LOW (ref 26.0–34.0)
MCHC: 30.2 g/dL (ref 30.0–36.0)
MCV: 83.9 fL (ref 80.0–100.0)
Monocytes Absolute: 1 10*3/uL (ref 0.1–1.0)
Monocytes Relative: 9 %
Neutro Abs: 8.4 10*3/uL — ABNORMAL HIGH (ref 1.7–7.7)
Neutrophils Relative %: 82 %
Platelets: 278 10*3/uL (ref 150–400)
RBC: 4.22 MIL/uL (ref 4.22–5.81)
RDW: 15.7 % — ABNORMAL HIGH (ref 11.5–15.5)
WBC: 10.3 10*3/uL (ref 4.0–10.5)
nRBC: 0 % (ref 0.0–0.2)

## 2018-08-04 MED ORDER — FENTANYL CITRATE (PF) 100 MCG/2ML IJ SOLN
INTRAMUSCULAR | Status: AC
  Filled 2018-08-04: qty 2

## 2018-08-04 MED ORDER — FENTANYL CITRATE (PF) 100 MCG/2ML IJ SOLN
INTRAMUSCULAR | Status: AC | PRN
  Administered 2018-08-04: 50 ug via INTRAVENOUS

## 2018-08-04 MED ORDER — ENOXAPARIN SODIUM 40 MG/0.4ML ~~LOC~~ SOLN
40.0000 mg | SUBCUTANEOUS | Status: DC
  Administered 2018-08-05 – 2018-08-06 (×2): 40 mg via SUBCUTANEOUS
  Filled 2018-08-04 (×2): qty 0.4

## 2018-08-04 MED ORDER — MIDAZOLAM HCL 2 MG/2ML IJ SOLN
INTRAMUSCULAR | Status: AC | PRN
  Administered 2018-08-04: 1 mg via INTRAVENOUS

## 2018-08-04 MED ORDER — LIDOCAINE-EPINEPHRINE (PF) 2 %-1:200000 IJ SOLN
INTRAMUSCULAR | Status: AC
  Filled 2018-08-04: qty 20

## 2018-08-04 MED ORDER — GELATIN ABSORBABLE 12-7 MM EX MISC
CUTANEOUS | Status: AC
  Filled 2018-08-04: qty 1

## 2018-08-04 MED ORDER — MIDAZOLAM HCL 2 MG/2ML IJ SOLN
INTRAMUSCULAR | Status: AC
  Filled 2018-08-04: qty 2

## 2018-08-04 MED ORDER — LIDOCAINE HCL (PF) 1 % IJ SOLN
INTRAMUSCULAR | Status: AC | PRN
  Administered 2018-08-04: 5 mL

## 2018-08-04 NOTE — Progress Notes (Signed)
At this time the patient is not in the room RN Jenny Reichmann notified to replace IV consult if needed when patient returns from procedure

## 2018-08-04 NOTE — Evaluation (Signed)
Occupational Therapy Evaluation Patient Details Name: [name] MRN: [id number] DOB: 08-Nov-1945 Today's Date: 08/04/2018    History of Present Illness 73 yo male admitted with sepsi 2* UTI. hx of bladder cancer with mets, CAD, DM, COPD   Clinical Impression   Pt admitted with sepsis. Pt currently with functional limitations due to the deficits listed below (see OT Problem List).  Pt will benefit from skilled OT to increase their safety and independence with ADL and functional mobility for ADL to facilitate discharge to venue listed below.      Follow Up Recommendations  Home health OT;Supervision/Assistance - 24 hour    Equipment Recommendations  None recommended by OT    Recommendations for Other Services       Precautions / Restrictions Precautions Precautions: None Restrictions Weight Bearing Restrictions: No      Mobility Bed Mobility Overal bed mobility: Modified Independent                Transfers Overall transfer level: Modified independent                    Balance Overall balance assessment: Mild deficits observed, not formally tested                                         ADL either performed or assessed with clinical judgement   ADL Overall ADL's : Needs assistance/impaired                                       General ADL Comments: Pt on bedrest s/p procedure.  Upon OT walking past room pt sitting up and peeing inthe floor.  RN called. Pts gown and sheets wet.  OT and RN changed linen and Aed pt back to bed .  Explained bedrest to patient     Vision Patient Visual Report: No change from baseline              Pertinent Vitals/Pain Pain Assessment: No/denies pain     Hand Dominance     Extremity/Trunk Assessment         Cervical / Trunk Assessment Cervical / Trunk Assessment: Normal   Communication Communication Communication: Prefers language other than English(can speak/understand  English)   Cognition Arousal/Alertness: Awake/alert Behavior During Therapy: WFL for tasks assessed/performed Overall Cognitive Status: History of cognitive impairments - at baseline                                                [place] expects to be discharged to:: Private residence Living Arrangements: Spouse/significant other                           Home Equipment: Kasandra Knudsen - single point          Prior Functioning/Environment Level of Independence: Independent with assistive device(s)                 OT Problem List: Decreased activity tolerance      OT Treatment/Interventions: Self-care/ADL training;Patient/family education    OT Goals(Current goals can be found in the care plan section) Acute Rehab OT Goals  Patient Stated Goal: get up! OT Goal Formulation: With patient  OT Frequency: Min 2X/week    AM-PAC OT "6 Clicks" Daily Activity     Outcome Measure Help from another person eating meals?: None Help from another person taking care of personal grooming?: None Help from another person toileting, which includes using toliet, bedpan, or urinal?: A Little Help from another person bathing (including washing, rinsing, drying)?: A Little Help from another person to put on and taking off regular upper body clothing?: A Little Help from another person to put on and taking off regular lower body clothing?: A Little 6 Click Score: 20   End of Session Nurse Communication: Mobility status  Activity Tolerance: Patient tolerated treatment well Patient left: in bed;with call bell/phone within reach;with nursing/sitter in room  OT Visit Diagnosis: Muscle weakness (generalized) (M62.81)                Time: 9030-0923 OT Time Calculation (min): 10 min Charges:  OT General Charges $OT Visit: 1 Visit OT Evaluation $OT Eval Moderate Complexity: 1 Mod  Kari Baars, OT Acute Rehabilitation Services [phone] Office- [phone]     Kritika Stukes, Edwena Felty D 08/04/2018, 6:28 PM

## 2018-08-04 NOTE — Evaluation (Signed)
Physical Therapy Evaluation-1x Patient Details Name: [name] MRN: [id number] DOB: 06-02-1945 Today's Date: 08/04/2018   History of Present Illness  73 yo male admitted with sepsi 2* UTI. hx of bladder cancer with mets, CAD, DM, COPD  Clinical Impression  On eval, pt was Mod Ind with mobility. He walked to/from bathroom while managing with IV pole without difficulty. No acute PT needs. 1x eval. Will sign off.     Follow Up Recommendations No PT follow up    Equipment Recommendations  None recommended by PT    Recommendations for Other Services       Precautions / Restrictions Precautions Precautions: None Restrictions Weight Bearing Restrictions: No      Mobility  Bed Mobility Overal bed mobility: Modified Independent                Transfers Overall transfer level: Modified independent                  Ambulation/Gait Ambulation/Gait assistance: Modified independent (Device/Increase time) Gait Distance (Feet): 15 Feet(x2) Assistive device: None;IV Airline pilot    Modified Rankin (Stroke Patients Only)       Balance Overall balance assessment: Mild deficits observed, not formally tested                                           Pertinent Vitals/Pain Pain Assessment: No/denies pain    Home Living Family/patient expects to be discharged to:: Private residence Living Arrangements: Spouse/significant other             Home Equipment: Cane - single point      Prior Function Level of Independence: Independent with assistive device(s)               Hand Dominance        Extremity/Trunk Assessment   Upper Extremity Assessment Upper Extremity Assessment: Overall WFL for tasks assessed    Lower Extremity Assessment Lower Extremity Assessment: Generalized weakness    Cervical / Trunk Assessment Cervical / Trunk Assessment: Normal  Communication   Communication: Prefers language other than English(can speak/understand English)  Cognition Arousal/Alertness: Awake/alert Behavior During Therapy: WFL for tasks assessed/performed Overall Cognitive Status: Within Functional Limits for tasks assessed                                        General Comments      Exercises     Assessment/Plan    PT Assessment Patent does not need any further PT services  PT Problem List         PT Treatment Interventions      PT Goals (Current goals can be found in the Care Plan section)  Acute Rehab PT Goals Patient Stated Goal: none stated PT Goal Formulation: All assessment and education complete, DC therapy    Frequency     Barriers to discharge        Co-evaluation               AM-PAC PT "6 Clicks" Mobility  Outcome Measure Help needed turning from your back to your side while in a flat bed without using bedrails?: None Help needed moving  from lying on your back to sitting on the side of a flat bed without using bedrails?: None Help needed moving to and from a bed to a chair (including a wheelchair)?: None Help needed standing up from a chair using your arms (e.g., wheelchair or bedside chair)?: None Help needed to walk in hospital room?: A Little Help needed climbing 3-5 steps with a railing? : A Little 6 Click Score: 22    End of Session   Activity Tolerance: Patient tolerated treatment well Patient left: in bed;with call bell/phone within reach        Time: 1023-1031 PT Time Calculation (min) (ACUTE ONLY): 8 min   Charges:   PT Evaluation $PT Eval Low Complexity: [place], PT Acute Rehabilitation Services Pager: [phone] Office: [phone]

## 2018-08-04 NOTE — Procedures (Signed)
Pre Procedure Dx: Liver mass Post Procedural Dx: Same  Technically successful US guided biopsy of infiltrative mass within the central aspect of the left lobe of the liver.   EBL: None No immediate complications.   Ronny Bacon, MD Pager #: [phone]

## 2018-08-04 NOTE — Care Management Important Message (Signed)
Important Message  Patient Details IM Letter given to Velva Harman RN to present to the Patient Name: [name] MRN: [id number] Date of Birth: Aug 11, 1945   Medicare Important Message Given:  Yes     Kerin Salen 08/04/2018, 10:51 AM

## 2018-08-04 NOTE — Progress Notes (Signed)
Events since he was hospitalized noted.  He is known to me with history of bladder cancer now has developed multiple hepatic lesions suspicious for stage IV disease.  I agree with obtaining tissue biopsy and will arrange follow-up upon his discharge to discuss the results and treatment options.  Palliative fat chemotherapy versus immunotherapy would be choices for him versus hospice care depending on his performance status and willingness to proceed with any further treatment.
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
## 2018-08-04 NOTE — Progress Notes (Signed)
MEDICATION-RELATED CONSULT NOTE   IR Procedure Consult - Anticoagulant/Antiplatelet PTA/Inpatient Med List Review by Pharmacist    Procedure: US guided liver lesion biopsy    Completed: 08/04/2018 at 1352  Post-Procedural bleeding risk per IR MD assessment: high  Antithrombotic medications on inpatient or PTA profile prior to procedure:   Aspirin 81mg  daily (not stopped) Lovenox 40mg  SQ q24h (last dose 08/02/2018)    Recommended restart time per IR Post-Procedure Guidelines: Aspirin 81mg  daily - unlikely to be stopped Prophylactic dose LMWH - next AM      Plan:     Continue Aspirin 81mg  PO daily as ordered Per discussion with Dr. Grandville Silos, okay to resume lovenox 40mg  SQ q24h for DVT prophylaxis on 08/05/2018 at 1000.   Lindell Spar, PharmD, BCPS Clinical Pharmacist  08/04/2018 3:04 PM

## 2018-08-05 DIAGNOSIS — R509 Fever, unspecified: Secondary | ICD-10-CM

## 2018-08-05 LAB — BASIC METABOLIC PANEL
Anion gap: 9 (ref 5–15)
BUN: 21 mg/dL (ref 8–23)
CO2: 22 mmol/L (ref 22–32)
Calcium: 7.9 mg/dL — ABNORMAL LOW (ref 8.9–10.3)
Chloride: 106 mmol/L (ref 98–111)
Creatinine, Ser: 1.34 mg/dL — ABNORMAL HIGH (ref 0.61–1.24)
GFR calc Af Amer: >60 mL/min (ref >60)
GFR calc non Af Amer: 52 mL/min — ABNORMAL LOW (ref >60)
Glucose, Bld: 127 mg/dL — ABNORMAL HIGH (ref 70–99)
Potassium: 4.5 mmol/L (ref 3.5–5.1)
Sodium: 137 mmol/L (ref 135–145)

## 2018-08-05 LAB — CBC WITH DIFFERENTIAL/PLATELET
Abs Immature Granulocytes: 0.07 10*3/uL (ref 0.00–0.07)
Basophils Absolute: 0 10*3/uL (ref 0.0–0.1)
Basophils Relative: 0 %
Eosinophils Absolute: 0.2 10*3/uL (ref 0.0–0.5)
Eosinophils Relative: 2 %
HCT: 34.7 % — ABNORMAL LOW (ref 39.0–52.0)
Hemoglobin: 10.4 g/dL — ABNORMAL LOW (ref 13.0–17.0)
Immature Granulocytes: 1 %
Lymphocytes Relative: 8 %
Lymphs Abs: 0.8 10*3/uL (ref 0.7–4.0)
MCH: 24.7 pg — ABNORMAL LOW (ref 26.0–34.0)
MCHC: 30 g/dL (ref 30.0–36.0)
MCV: 82.4 fL (ref 80.0–100.0)
Monocytes Absolute: 0.9 10*3/uL (ref 0.1–1.0)
Monocytes Relative: 9 %
Neutro Abs: 8.2 10*3/uL — ABNORMAL HIGH (ref 1.7–7.7)
Neutrophils Relative %: 80 %
Platelets: 310 10*3/uL (ref 150–400)
RBC: 4.21 MIL/uL — ABNORMAL LOW (ref 4.22–5.81)
RDW: 15.9 % — ABNORMAL HIGH (ref 11.5–15.5)
WBC: 10.3 10*3/uL (ref 4.0–10.5)
nRBC: 0 % (ref 0.0–0.2)

## 2018-08-05 LAB — GLUCOSE, CAPILLARY
Glucose-Capillary: 129 mg/dL — ABNORMAL HIGH (ref 70–99)
Glucose-Capillary: 123 mg/dL — ABNORMAL HIGH (ref 70–99)
Glucose-Capillary: 150 mg/dL — ABNORMAL HIGH (ref 70–99)
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
## 2018-08-05 NOTE — Progress Notes (Signed)
Occupational Therapy Treatment and Discharge  Patient Details Name: [name] MRN: [id number] DOB: 14-Oct-1945 Today's Date: 08/05/2018    History of present illness 73 yo male admitted with sepsi 2* UTI. hx of bladder cancer with mets, CAD, DM, COPD   OT comments  Pt reports he feels great today.  He was able to perform ADLs mod I today as well as ambulate mod I in hallway.  He has met all OT goals.  He reports family is available to assist him as needed at discharge.  OT will sign off at this time, and pt agrees with goals achieved.   Follow Up Recommendations  No OT follow up    Equipment Recommendations  None recommended by OT    Recommendations for Other Services      Precautions / Restrictions Precautions Precautions: None Restrictions Weight Bearing Restrictions: No       Mobility Bed Mobility                  Transfers Overall transfer level: Modified independent                    Balance Overall balance assessment: No apparent balance deficits (not formally assessed)                                         ADL either performed or assessed with clinical judgement   ADL Overall ADL's : Modified independent                                             Vision       Perception     Praxis      Cognition Arousal/Alertness: Awake/alert Behavior During Therapy: WFL for tasks assessed/performed Overall Cognitive Status: Within Functional Limits for tasks assessed                                 General Comments: for basic info         Exercises     Shoulder Instructions       General Comments Pt reports today is a good day and he feels great.  He was able to perofrm ADLs and ambulate in hallway without LOB or dyspnea     Pertinent Vitals/ Pain       Pain Assessment: No/denies pain  Home Living                                          Prior  Functioning/Environment              Frequency           Progress Toward Goals  OT Goals(current goals can now be found in the care plan section)  Progress towards OT goals: Progressing toward goals;Goals met/education completed, patient discharged from OT  ADL Goals Pt Will Perform Upper Body Bathing: with modified independence;standing;sitting Pt Will Perform Lower Body Bathing: with modified independence;sit to/from stand Pt Will Perform Upper Body Dressing: with modified independence;sitting Pt Will Perform Lower Body Dressing: with modified independence;sit to/from stand Pt Will Transfer to Toilet: with  modified independence;ambulating;regular height toilet Pt Will Perform Toileting - Clothing Manipulation and hygiene: with modified independence;sit to/from stand  Plan Discharge plan needs to be updated;All goals met and education completed, patient discharged from OT services    Co-evaluation                 AM-PAC OT "6 Clicks" Daily Activity     Outcome Measure   Help from another person eating meals?: None Help from another person taking care of personal grooming?: None Help from another person toileting, which includes using toliet, bedpan, or urinal?: None Help from another person bathing (including washing, rinsing, drying)?: None Help from another person to put on and taking off regular upper body clothing?: None Help from another person to put on and taking off regular lower body clothing?: None 6 Click Score: 24    End of Session    OT Visit Diagnosis: Muscle weakness (generalized) (M62.81)   Activity Tolerance Patient tolerated treatment well   Patient Left in bed;with call bell/phone within reach   Nurse Communication Mobility status        Time: 0092-3300 OT Time Calculation (min): 11 min  Charges: OT General Charges $OT Visit: 1 Visit OT Treatments $Self Care/Home Management : 8-22 mins  Lucille Passy, OTR/L [place] Pager [phone] Office [phone]    Lucille Passy M 08/05/2018, 12:28 PM

## 2018-08-05 NOTE — Progress Notes (Signed)
I am following this patient for advanced bladder cancer.  He was admitted from the preop holding area prior to being able to re-resect his bladder tumor and exchange his stent.  In the emergency department a CT scan was performed demonstrating diffuse metastatic disease.   Interval: Today, the patient looks significantly better than he did upon admission.  He is eating better and has few complaints.  He does have some pain diffusely in his back and over the area where the liver biopsy was performed. The patient denies any significant voiding symptoms.  He is not any fevers or chills.  His appetite is diminished, but denies nausea.  Scheduled Meds: . amLODipine  10 mg Oral Daily  . aspirin EC  81 mg Oral Daily  . carvedilol  3.125 mg Oral BID  . enoxaparin (LOVENOX) injection  40 mg Subcutaneous Q24H  . folic acid  1 mg Oral Daily  . ibuprofen  400 mg Oral BID  . insulin aspart  0-5 Units Subcutaneous QHS  . insulin aspart  0-9 Units Subcutaneous TID WC  . mometasone-formoterol  2 puff Inhalation BID  . pantoprazole  40 mg Oral Q0600  . potassium chloride SA  20 mEq Oral Daily  . rosuvastatin  40 mg Oral Daily  . senna-docusate  1 tablet Oral BID  . tamsulosin  0.4 mg Oral QPC breakfast  . umeclidinium bromide  1 puff Inhalation Daily   Continuous Infusions: . sodium chloride 250 mL (08/01/18 2046)  . ciprofloxacin 400 mg (08/05/18 0846)   PRN Meds:.sodium chloride, acetaminophen **OR** acetaminophen, albuterol, morphine injection, nitroGLYCERIN, ondansetron **OR** ondansetron (ZOFRAN) IV, oxyCODONE  Patient Active Problem List   Diagnosis Date Noted  . Metastatic disease ([place])   . Sepsis due to gram-negative UTI ([place]) 08/01/2018  . Liver lesion, left lobe 08/01/2018  . Bladder cancer ([place]) 06/06/2018  . Pulmonary nodules 06/04/2018  . COPD GOLD II  06/03/2018  . Preop cardiovascular exam 04/27/2018  . 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 ([place]) 03/15/2017  . Hematuria  08/03/2016  . Dysuria 03/01/2015  . Type 2 diabetes mellitus with circulatory disorder ([place])   . PVC's (premature ventricular contractions)   . CAD S/P BMS PCI - pRCA, mRCA, rPAV 06/30/2012  . History of ST elevation myocardial infarction (STEMI) of inferior wall ([place]) 02/17/2012  . Hyperlipidemia associated with type 2 diabetes mellitus ([place]) 02/17/2012  . Essential hypertension 02/17/2012  . Tobacco abuse, has stopped 02/17/2012  . Allergic rhinitis 02/17/2012  . COPD with exacerbation ([place]) 05/31/2009  . OBSTRUCTIVE CHRONIC BRONCHITIS 11/13/2006  . GERD 11/13/2006     NAD Vitals:   08/05/18 0527 08/05/18 0748 08/05/18 1312 08/05/18 1654  BP: (!) 151/73  133/72   Pulse: 77 94 74   Resp: 17 18 18    Temp: 98.7 F (37.1 C)  98.3 F (36.8 C) 98.5 F (36.9 C)  TempSrc:   Oral Oral  SpO2: 96% 96% 98%   Weight:      Height:        Intake/Output Summary (Last 24 hours) at 08/05/2018 1807 Last data filed at 08/05/2018 1300 Gross per 24 hour  Intake 688.56 ml  Output 326 ml  Net 362.56 ml   NAD Non-labored breathing Abdomen is soft - puncture site is without bleeding or bruising. Ext symmetric  Recent Labs    08/03/18 0621 08/04/18 0621 08/05/18 0515  WBC 9.3 10.3 10.3  HGB 11.0* 10.7* 10.4*  HCT 36.3* 35.4* 34.7*   Recent Labs  08/03/18 0621 08/04/18 0621 08/05/18 0515  NA 137 137 137  K 3.6 3.9 4.5  CL 105 103 106  CO2 24 22 22   GLUCOSE 98 102* 127*  BUN 22 21 21   CREATININE 1.31* 1.25* 1.34*  CALCIUM 8.2* 7.9* 7.9*   No results for input(s): LABPT, INR in the last 72 hours. No results for input(s): PSA in the last 72 hours. No results for input(s): LABURIN in the last 72 hours. Results for orders placed or performed during the hospital encounter of 08/01/18  Culture, blood (routine x 2)     Status: None (Preliminary result)   Collection Time: 08/01/18 11:44 AM   Specimen: BLOOD  Result Value Ref Range Status   Specimen Description   Final     BLOOD LEFT ANTECUBITAL Performed at [facility] [address], [place], [place] [address]    Special Requests   Final    BOTTLES DRAWN AEROBIC AND ANAEROBIC Blood Culture adequate volume Performed at [facility] [address], [place], [place] [address]    Culture   Final    NO GROWTH 4 DAYS Performed at [facility], [place] [address] [place], [place] [address]    Report Status PENDING  Incomplete  Culture, blood (routine x 2)     Status: None (Preliminary result)   Collection Time: 08/01/18 11:49 AM   Specimen: BLOOD  Result Value Ref Range Status   Specimen Description   Final    BLOOD RIGHT ANTECUBITAL Performed at [facility] [address], [place], [place] [address]    Special Requests   Final    BOTTLES DRAWN AEROBIC AND ANAEROBIC Blood Culture results may not be optimal due to an excessive volume of blood received in culture bottles Performed at [facility] [address], [place], [place] [address]    Culture   Final    NO GROWTH 4 DAYS Performed at [facility], [place] [address], [place], [place] [address]    Report Status PENDING  Incomplete  Urine culture     Status: Abnormal   Collection Time: 08/01/18 12:27 PM   Specimen: In/Out Cath Urine  Result Value Ref Range Status   Specimen Description   Final    IN/OUT CATH URINE Performed at [facility] [address] [place],  [address]    Special Requests   Final    NONE Performed at [facility], [place] [address], [place],  [address]    Culture (A)  Final    >=100,000 COLONIES/mL KLEBSIELLA PNEUMONIAE >=100,000 COLONIES/mL PSEUDOMONAS AERUGINOSA    Report Status 08/03/2018 FINAL  Final   Organism ID, Bacteria KLEBSIELLA PNEUMONIAE (A)  Final   Organism ID, Bacteria PSEUDOMONAS AERUGINOSA (A)  Final      Susceptibility   Klebsiella pneumoniae - MIC*    AMPICILLIN  >=32 RESISTANT Resistant     CEFAZOLIN <=4 SENSITIVE Sensitive     CEFTRIAXONE <=1 SENSITIVE Sensitive     CIPROFLOXACIN <=0.25 SENSITIVE Sensitive     GENTAMICIN <=1 SENSITIVE Sensitive     IMIPENEM <=0.25 SENSITIVE Sensitive     NITROFURANTOIN 128 RESISTANT Resistant     TRIMETH/SULFA <=20 SENSITIVE Sensitive     AMPICILLIN/SULBACTAM >=32 RESISTANT Resistant     PIP/TAZO 8 SENSITIVE Sensitive     Extended ESBL NEGATIVE Sensitive     * >=100,000 COLONIES/mL KLEBSIELLA PNEUMONIAE   Pseudomonas aeruginosa - MIC*  CEFTAZIDIME 4 SENSITIVE Sensitive     CIPROFLOXACIN <=0.25 SENSITIVE Sensitive     GENTAMICIN 2 SENSITIVE Sensitive     IMIPENEM 2 SENSITIVE Sensitive     PIP/TAZO 8 SENSITIVE Sensitive     CEFEPIME 4 SENSITIVE Sensitive     * >=100,000 COLONIES/mL PSEUDOMONAS AERUGINOSA    Imp: The patient has what appears to be advanced/high-grade metastatic bladder cancer.  His biopsy results are pending.  He also is being treated for Pseudomonas urinary tract infection.  Overall, the patient appears significantly better with better pain control today.  Recommendations: The patient should be treated for his UTI for total 7 days.  He needs no additional treatment for that beyond antibiotic therapy.  His stent will likely remain in indefinitely given his overall poor prognosis.  He will be scheduled to follow-up with oncology at the time of discharge for further discussion of treatment options which would largely be palliative in nature.  I will sign off on this patient, but I am happy to see him if an additional need or questions arise.  I did update the patient's family on his current status and circumstance.

## 2018-08-06 ENCOUNTER — Telehealth: Payer: Self-pay | Admitting: Oncology

## 2018-08-06 DIAGNOSIS — R160 Hepatomegaly, not elsewhere classified: Secondary | ICD-10-CM

## 2018-08-06 DIAGNOSIS — N3001 Acute cystitis with hematuria: Secondary | ICD-10-CM

## 2018-08-06 LAB — GLUCOSE, CAPILLARY
Glucose-Capillary: 117 mg/dL — ABNORMAL HIGH (ref 70–99)
Glucose-Capillary: 102 mg/dL — ABNORMAL HIGH (ref 70–99)
Glucose-Capillary: 107 mg/dL — ABNORMAL HIGH (ref 70–99)
Glucose-Capillary: 202 mg/dL — ABNORMAL HIGH (ref 70–99)
Glucose-Capillary: 110 mg/dL — ABNORMAL HIGH (ref 70–99)
Glucose-Capillary: 101 mg/dL — ABNORMAL HIGH (ref 70–99)
Glucose-Capillary: 176 mg/dL — ABNORMAL HIGH (ref 70–99)

## 2018-08-06 LAB — CULTURE, BLOOD (ROUTINE X 2)
Culture: NO GROWTH
Culture: NO GROWTH
Special Requests: ADEQUATE

## 2018-08-06 MED ORDER — ACETAMINOPHEN 325 MG PO TABS: 650.0000 mg | ORAL_TABLET | Freq: Four times a day (QID) | ORAL | Status: AC | PRN

## 2018-08-06 MED ORDER — CIPROFLOXACIN HCL 500 MG PO TABS: 500.0000 mg | ORAL_TABLET | Freq: Two times a day (BID) | ORAL | 0 refills | Status: AC

## 2018-08-06 MED ORDER — PANTOPRAZOLE SODIUM 40 MG PO TBEC: 40.0000 mg | DELAYED_RELEASE_TABLET | Freq: Every day | ORAL | 2 refills | Status: AC

## 2018-08-06 MED ORDER — SENNOSIDES-DOCUSATE SODIUM 8.6-50 MG PO TABS: 1.0000 | ORAL_TABLET | Freq: Two times a day (BID) | ORAL | Status: AC

## 2018-08-06 MED ORDER — CIPROFLOXACIN HCL 500 MG PO TABS
500.0000 mg | ORAL_TABLET | Freq: Two times a day (BID) | ORAL | Status: DC
  Administered 2018-08-06: 500 mg via ORAL
  Filled 2018-08-06: qty 1

## 2018-08-06 MED ORDER — OXYCODONE HCL 5 MG PO TABS: 5.0000 mg | ORAL_TABLET | ORAL | 0 refills | Status: AC | PRN

## 2018-08-06 MED ORDER — CARVEDILOL 3.125 MG PO TABS: 3.1250 mg | ORAL_TABLET | Freq: Two times a day (BID) | ORAL | 2 refills | Status: AC

## 2018-08-06 NOTE — Plan of Care (Signed)
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
## 2018-08-06 NOTE — Telephone Encounter (Signed)
Scheduled appt per 7/29 sch message- pt daughter is aware of appt date and time

## 2018-08-06 NOTE — Progress Notes (Signed)
[name] to be D/C'd Home per MD order.  Discussed prescriptions and follow up appointments with the patient. Prescriptions given to patient, medication list explained in detail. Pt verbalized understanding.  Allergies as of 08/06/2018   No Known Allergies     Medication List    TAKE these medications   acetaminophen 325 MG tablet Commonly known as: TYLENOL Take 2 tablets (650 mg total) by mouth every 6 (six) hours as needed for mild pain (or Fever >/= 101).   albuterol (2.5 MG/3ML) 0.083% nebulizer solution Commonly known as: PROVENTIL Take 2.5 mg by nebulization every 6 (six) hours as needed for wheezing or shortness of breath. What changed: Another medication with the same name was changed. Make sure you understand how and when to take each.   albuterol 108 (90 Base) MCG/ACT inhaler Commonly known as: Proventil HFA Inhale 2 puffs into the lungs 2 (two) times daily. What changed:   when to take this  reasons to take this   amLODipine 10 MG tablet Commonly known as: NORVASC Take 10 mg by mouth daily.   aspirin EC 81 MG tablet Take 81 mg by mouth daily.   carvedilol 3.125 MG tablet Commonly known as: COREG Take 1 tablet (3.125 mg total) by mouth 2 (two) times daily.   ciprofloxacin 500 MG tablet Commonly known as: CIPRO Take 1 tablet (500 mg total) by mouth 2 (two) times daily for 5 days.   folic acid 1 MG tablet Commonly known as: FOLVITE Take 1 mg by mouth daily.   ibuprofen 200 MG tablet Commonly known as: ADVIL Take 400 mg by mouth 2 (two) times a day.   metFORMIN 500 MG tablet Commonly known as: GLUCOPHAGE Take 500 mg by mouth 2 (two) times daily with a meal.   nitroGLYCERIN 0.4 MG SL tablet Commonly known as: Nitrostat Place 1 tablet (0.4 mg total) under the tongue every 5 (five) minutes as needed for chest pain.   oxyCODONE 5 MG immediate release tablet Commonly known as: Oxy IR/ROXICODONE Take 1 tablet (5 mg total) by mouth every 4 (four) hours as  needed for up to 5 days for moderate pain.   pantoprazole 40 MG tablet Commonly known as: PROTONIX Take 1 tablet (40 mg total) by mouth daily at 6 (six) AM. Start taking on: August 07, 2018   potassium chloride SA 20 MEQ tablet Commonly known as: K-DUR Take 20 mEq by mouth daily.   rosuvastatin 40 MG tablet Commonly known as: CRESTOR Take 40 mg by mouth daily.   senna-docusate 8.6-50 MG tablet Commonly known as: Senokot-S Take 1 tablet by mouth 2 (two) times daily.   Symbicort 160-4.5 MCG/ACT inhaler Generic drug: budesonide-formoterol Inhale 2 puffs into the lungs 2 (two) times a day.   tamsulosin 0.4 MG Caps capsule Commonly known as: FLOMAX Take 2 capsules (0.8 mg total) by mouth every evening. What changed:   how much to take  when to take this   Tiotropium Bromide Monohydrate 2.5 MCG/ACT Aers Commonly known as: Spiriva Respimat 2 pffs each am What changed:   how much to take  how to take this  when to take this   Vitamin D (Ergocalciferol) 1.25 MG (50000 UT) Caps capsule Commonly known as: DRISDOL Take 50,000 Units by mouth once a week.       Vitals:   08/06/18 0850 08/06/18 1407  BP:  117/66  Pulse:  73  Resp:  18  Temp:  98.6 F (37 C)  SpO2: 93% 95%  Skin clean, dry and intact without evidence of skin break down, no evidence of skin tears noted. IV catheter discontinued intact. Site without signs and symptoms of complications. Dressing and pressure applied. Pt denies pain at this time. No complaints noted.  An After Visit Summary was printed and given to the patient. Patient escorted via [place], and D/C home via private auto.  Lolita Rieger 08/06/2018 3:49 PM

## 2018-08-10 LAB — CULTURE, BLOOD (ROUTINE X 2)
Culture: NO GROWTH
Culture: NO GROWTH
Special Requests: ADEQUATE

## 2018-08-11 ENCOUNTER — Other Ambulatory Visit: Payer: Self-pay | Admitting: Oncology

## 2018-08-11 ENCOUNTER — Inpatient Hospital Stay: Payer: Medicare HMO | Attending: Oncology | Admitting: Oncology

## 2018-08-11 NOTE — Progress Notes (Signed)
An attempted virtual visit was not as successful as [name] was not available according to his daughter.  I continue discussions with his daughter regarding the findings of recent CT scans and we have decided to reschedule his visit for 1 week from today to discuss results and treatment plan.  Tentatively will be evaluated on 08/18/2018.

## 2018-08-11 NOTE — Progress Notes (Signed)
This encounter was created in error - please disregard.

## 2018-08-18 ENCOUNTER — Inpatient Hospital Stay: Payer: Medicare HMO | Admitting: Oncology

## 2018-08-18 ENCOUNTER — Other Ambulatory Visit: Payer: Self-pay | Admitting: Oncology

## 2018-08-18 NOTE — Progress Notes (Signed)
I attempted to reach the patient and his daughter today for their scheduled video conference visit.  I was unable to successfully reach them.  Visit will be rescheduled in the time being.

## 2018-08-18 NOTE — Progress Notes (Signed)
This encounter was created in error - please disregard.

## 2018-08-19 ENCOUNTER — Emergency Department (HOSPITAL_COMMUNITY): Payer: Medicare HMO

## 2018-08-19 ENCOUNTER — Inpatient Hospital Stay (HOSPITAL_COMMUNITY)
Admission: EM | Admit: 2018-08-19 | Discharge: 2018-09-09 | DRG: 871 | Disposition: E | Payer: Medicare HMO | Attending: Internal Medicine | Admitting: Internal Medicine

## 2018-08-19 ENCOUNTER — Encounter (HOSPITAL_COMMUNITY): Payer: Self-pay | Admitting: Emergency Medicine

## 2018-08-19 DIAGNOSIS — I2510 Atherosclerotic heart disease of native coronary artery without angina pectoris: Secondary | ICD-10-CM | POA: Diagnosis present

## 2018-08-19 DIAGNOSIS — Z79899 Other long term (current) drug therapy: Secondary | ICD-10-CM

## 2018-08-19 DIAGNOSIS — C787 Secondary malignant neoplasm of liver and intrahepatic bile duct: Secondary | ICD-10-CM | POA: Diagnosis present

## 2018-08-19 DIAGNOSIS — Z955 Presence of coronary angioplasty implant and graft: Secondary | ICD-10-CM

## 2018-08-19 DIAGNOSIS — R918 Other nonspecific abnormal finding of lung field: Secondary | ICD-10-CM | POA: Diagnosis present

## 2018-08-19 DIAGNOSIS — Z87440 Personal history of urinary (tract) infections: Secondary | ICD-10-CM

## 2018-08-19 DIAGNOSIS — R109 Unspecified abdominal pain: Secondary | ICD-10-CM | POA: Diagnosis not present

## 2018-08-19 DIAGNOSIS — N179 Acute kidney failure, unspecified: Secondary | ICD-10-CM | POA: Diagnosis present

## 2018-08-19 DIAGNOSIS — N183 Chronic kidney disease, stage 3 (moderate): Secondary | ICD-10-CM | POA: Diagnosis present

## 2018-08-19 DIAGNOSIS — J9602 Acute respiratory failure with hypercapnia: Secondary | ICD-10-CM | POA: Diagnosis not present

## 2018-08-19 DIAGNOSIS — Z7189 Other specified counseling: Secondary | ICD-10-CM | POA: Diagnosis not present

## 2018-08-19 DIAGNOSIS — D649 Anemia, unspecified: Secondary | ICD-10-CM

## 2018-08-19 DIAGNOSIS — E1122 Type 2 diabetes mellitus with diabetic chronic kidney disease: Secondary | ICD-10-CM | POA: Diagnosis present

## 2018-08-19 DIAGNOSIS — C7800 Secondary malignant neoplasm of unspecified lung: Secondary | ICD-10-CM | POA: Diagnosis present

## 2018-08-19 DIAGNOSIS — K7290 Hepatic failure, unspecified without coma: Secondary | ICD-10-CM | POA: Diagnosis present

## 2018-08-19 DIAGNOSIS — R188 Other ascites: Secondary | ICD-10-CM | POA: Diagnosis present

## 2018-08-19 DIAGNOSIS — K750 Abscess of liver: Secondary | ICD-10-CM | POA: Diagnosis present

## 2018-08-19 DIAGNOSIS — Z20828 Contact with and (suspected) exposure to other viral communicable diseases: Secondary | ICD-10-CM | POA: Diagnosis present

## 2018-08-19 DIAGNOSIS — Z66 Do not resuscitate: Secondary | ICD-10-CM | POA: Diagnosis not present

## 2018-08-19 DIAGNOSIS — J9601 Acute respiratory failure with hypoxia: Secondary | ICD-10-CM | POA: Diagnosis not present

## 2018-08-19 DIAGNOSIS — A419 Sepsis, unspecified organism: Principal | ICD-10-CM | POA: Diagnosis present

## 2018-08-19 DIAGNOSIS — K659 Peritonitis, unspecified: Secondary | ICD-10-CM | POA: Diagnosis present

## 2018-08-19 DIAGNOSIS — I5189 Other ill-defined heart diseases: Secondary | ICD-10-CM | POA: Diagnosis present

## 2018-08-19 DIAGNOSIS — Z515 Encounter for palliative care: Secondary | ICD-10-CM | POA: Diagnosis not present

## 2018-08-19 DIAGNOSIS — Z8701 Personal history of pneumonia (recurrent): Secondary | ICD-10-CM

## 2018-08-19 DIAGNOSIS — D72829 Elevated white blood cell count, unspecified: Secondary | ICD-10-CM | POA: Diagnosis not present

## 2018-08-19 DIAGNOSIS — N3000 Acute cystitis without hematuria: Secondary | ICD-10-CM

## 2018-08-19 DIAGNOSIS — E876 Hypokalemia: Secondary | ICD-10-CM | POA: Diagnosis present

## 2018-08-19 DIAGNOSIS — R0603 Acute respiratory distress: Secondary | ICD-10-CM | POA: Diagnosis not present

## 2018-08-19 DIAGNOSIS — R609 Edema, unspecified: Secondary | ICD-10-CM | POA: Diagnosis not present

## 2018-08-19 DIAGNOSIS — Z960 Presence of urogenital implants: Secondary | ICD-10-CM | POA: Diagnosis present

## 2018-08-19 DIAGNOSIS — I252 Old myocardial infarction: Secondary | ICD-10-CM

## 2018-08-19 DIAGNOSIS — Z7984 Long term (current) use of oral hypoglycemic drugs: Secondary | ICD-10-CM

## 2018-08-19 DIAGNOSIS — J22 Unspecified acute lower respiratory infection: Secondary | ICD-10-CM

## 2018-08-19 DIAGNOSIS — C799 Secondary malignant neoplasm of unspecified site: Secondary | ICD-10-CM

## 2018-08-19 DIAGNOSIS — C672 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: Secondary | ICD-10-CM | POA: Diagnosis present

## 2018-08-19 DIAGNOSIS — R52 Pain, unspecified: Secondary | ICD-10-CM | POA: Diagnosis not present

## 2018-08-19 DIAGNOSIS — B370 Candidal stomatitis: Secondary | ICD-10-CM | POA: Diagnosis present

## 2018-08-19 DIAGNOSIS — R509 Fever, unspecified: Secondary | ICD-10-CM | POA: Diagnosis not present

## 2018-08-19 DIAGNOSIS — N419 Inflammatory disease of prostate, unspecified: Secondary | ICD-10-CM | POA: Diagnosis present

## 2018-08-19 DIAGNOSIS — I959 Hypotension, unspecified: Secondary | ICD-10-CM | POA: Diagnosis present

## 2018-08-19 DIAGNOSIS — Z7982 Long term (current) use of aspirin: Secondary | ICD-10-CM

## 2018-08-19 DIAGNOSIS — D62 Acute posthemorrhagic anemia: Secondary | ICD-10-CM | POA: Diagnosis present

## 2018-08-19 DIAGNOSIS — K219 Gastro-esophageal reflux disease without esophagitis: Secondary | ICD-10-CM | POA: Diagnosis present

## 2018-08-19 DIAGNOSIS — E785 Hyperlipidemia, unspecified: Secondary | ICD-10-CM | POA: Diagnosis present

## 2018-08-19 DIAGNOSIS — R8281 Pyuria: Secondary | ICD-10-CM | POA: Diagnosis present

## 2018-08-19 DIAGNOSIS — J439 Emphysema, unspecified: Secondary | ICD-10-CM | POA: Diagnosis present

## 2018-08-19 DIAGNOSIS — N1330 Unspecified hydronephrosis: Secondary | ICD-10-CM | POA: Diagnosis present

## 2018-08-19 DIAGNOSIS — I129 Hypertensive chronic kidney disease with stage 1 through stage 4 chronic kidney disease, or unspecified chronic kidney disease: Secondary | ICD-10-CM | POA: Diagnosis present

## 2018-08-19 DIAGNOSIS — Z791 Long term (current) use of non-steroidal anti-inflammatories (NSAID): Secondary | ICD-10-CM

## 2018-08-19 DIAGNOSIS — R319 Hematuria, unspecified: Secondary | ICD-10-CM | POA: Diagnosis present

## 2018-08-19 DIAGNOSIS — R410 Disorientation, unspecified: Secondary | ICD-10-CM | POA: Diagnosis not present

## 2018-08-19 DIAGNOSIS — I700 Atherosclerosis of aorta: Secondary | ICD-10-CM | POA: Diagnosis present

## 2018-08-19 DIAGNOSIS — R7989 Other specified abnormal findings of blood chemistry: Secondary | ICD-10-CM | POA: Diagnosis not present

## 2018-08-19 DIAGNOSIS — Z7951 Long term (current) use of inhaled steroids: Secondary | ICD-10-CM

## 2018-08-19 LAB — CBC WITH DIFFERENTIAL/PLATELET
Abs Immature Granulocytes: 0.15 10*3/uL — ABNORMAL HIGH (ref 0.00–0.07)
Basophils Absolute: 0 10*3/uL (ref 0.0–0.1)
Basophils Relative: 0 %
Eosinophils Absolute: 0 10*3/uL (ref 0.0–0.5)
Eosinophils Relative: 0 %
HCT: 19.4 % — ABNORMAL LOW (ref 39.0–52.0)
Hemoglobin: 6 g/dL — CL (ref 13.0–17.0)
Immature Granulocytes: 1 %
Lymphocytes Relative: 5 %
Lymphs Abs: 0.6 10*3/uL — ABNORMAL LOW (ref 0.7–4.0)
MCH: 24.2 pg — ABNORMAL LOW (ref 26.0–34.0)
MCHC: 30.9 g/dL (ref 30.0–36.0)
MCV: 78.2 fL — ABNORMAL LOW (ref 80.0–100.0)
Monocytes Absolute: 1.1 10*3/uL — ABNORMAL HIGH (ref 0.1–1.0)
Monocytes Relative: 9 %
Neutro Abs: 9.7 10*3/uL — ABNORMAL HIGH (ref 1.7–7.7)
Neutrophils Relative %: 85 %
Platelets: 163 10*3/uL (ref 150–400)
RBC: 2.48 MIL/uL — ABNORMAL LOW (ref 4.22–5.81)
RDW: 18 % — ABNORMAL HIGH (ref 11.5–15.5)
WBC: 11.5 10*3/uL — ABNORMAL HIGH (ref 4.0–10.5)
nRBC: 0 % (ref 0.0–0.2)

## 2018-08-19 LAB — PREPARE RBC (CROSSMATCH)

## 2018-08-19 LAB — URINALYSIS, ROUTINE W REFLEX MICROSCOPIC
Bilirubin Urine: NEGATIVE
Glucose, UA: NEGATIVE mg/dL
Ketones, ur: NEGATIVE mg/dL
Nitrite: NEGATIVE
Protein, ur: 30 mg/dL — AB
RBC / HPF: >50 RBC/hpf — ABNORMAL HIGH (ref 0–5)
Specific Gravity, Urine: 1.015 (ref 1.005–1.030)
WBC, UA: >50 WBC/hpf — ABNORMAL HIGH (ref 0–5)
pH: 5 (ref 5.0–8.0)

## 2018-08-19 LAB — COMPREHENSIVE METABOLIC PANEL
ALT: 67 U/L — ABNORMAL HIGH (ref 0–44)
AST: 129 U/L — ABNORMAL HIGH (ref 15–41)
Albumin: 2.1 g/dL — ABNORMAL LOW (ref 3.5–5.0)
Alkaline Phosphatase: 642 U/L — ABNORMAL HIGH (ref 38–126)
Anion gap: 10 (ref 5–15)
BUN: 24 mg/dL — ABNORMAL HIGH (ref 8–23)
CO2: 22 mmol/L (ref 22–32)
Calcium: 7.6 mg/dL — ABNORMAL LOW (ref 8.9–10.3)
Chloride: 103 mmol/L (ref 98–111)
Creatinine, Ser: 1.28 mg/dL — ABNORMAL HIGH (ref 0.61–1.24)
GFR calc Af Amer: >60 mL/min (ref >60)
GFR calc non Af Amer: 55 mL/min — ABNORMAL LOW (ref >60)
Glucose, Bld: 170 mg/dL — ABNORMAL HIGH (ref 70–99)
Potassium: 3.4 mmol/L — ABNORMAL LOW (ref 3.5–5.1)
Sodium: 135 mmol/L (ref 135–145)
Total Bilirubin: 2.5 mg/dL — ABNORMAL HIGH (ref 0.3–1.2)
Total Protein: 5.6 g/dL — ABNORMAL LOW (ref 6.5–8.1)

## 2018-08-19 LAB — CBC
HCT: 19.5 % — ABNORMAL LOW (ref 39.0–52.0)
Hemoglobin: 5.9 g/dL — CL (ref 13.0–17.0)
MCH: 24.1 pg — ABNORMAL LOW (ref 26.0–34.0)
MCHC: 30.3 g/dL (ref 30.0–36.0)
MCV: 79.6 fL — ABNORMAL LOW (ref 80.0–100.0)
Platelets: 158 10*3/uL (ref 150–400)
RBC: 2.45 MIL/uL — ABNORMAL LOW (ref 4.22–5.81)
RDW: 18.1 % — ABNORMAL HIGH (ref 11.5–15.5)
WBC: 12.6 10*3/uL — ABNORMAL HIGH (ref 4.0–10.5)
nRBC: 0 % (ref 0.0–0.2)

## 2018-08-19 LAB — LACTIC ACID, PLASMA: Lactic Acid, Venous: 1.7 mmol/L (ref 0.5–1.9)

## 2018-08-19 LAB — POC OCCULT BLOOD, ED: Fecal Occult Bld: NEGATIVE

## 2018-08-19 LAB — PROTIME-INR
INR: 1.4 — ABNORMAL HIGH (ref 0.8–1.2)
Prothrombin Time: 17.3 seconds — ABNORMAL HIGH (ref 11.4–15.2)

## 2018-08-19 LAB — LIPASE, BLOOD: Lipase: 16 U/L (ref 11–51)

## 2018-08-19 LAB — GLUCOSE, CAPILLARY: Glucose-Capillary: 123 mg/dL — ABNORMAL HIGH (ref 70–99)

## 2018-08-19 MED ORDER — SODIUM CHLORIDE 0.9 % IV SOLN: 10.0000 mL/h | Freq: Once | INTRAVENOUS | Status: DC

## 2018-08-19 MED ORDER — ENOXAPARIN SODIUM 40 MG/0.4ML ~~LOC~~ SOLN: 40.0000 mg | SUBCUTANEOUS | Status: DC

## 2018-08-19 MED ORDER — SENNOSIDES-DOCUSATE SODIUM 8.6-50 MG PO TABS
1.0000 | ORAL_TABLET | Freq: Two times a day (BID) | ORAL | Status: DC
  Administered 2018-08-19 – 2018-08-23 (×9): 1 via ORAL
  Filled 2018-08-19 (×10): qty 1

## 2018-08-19 MED ORDER — SODIUM CHLORIDE 0.9 % IV SOLN
2.0000 g | INTRAVENOUS | Status: AC
  Administered 2018-08-19: 21:00:00 2 g via INTRAVENOUS
  Filled 2018-08-19 (×2): qty 2

## 2018-08-19 MED ORDER — TIOTROPIUM BROMIDE MONOHYDRATE 2.5 MCG/ACT IN AERS: 2.0000 | INHALATION_SPRAY | Freq: Every morning | RESPIRATORY_TRACT | Status: DC

## 2018-08-19 MED ORDER — ONDANSETRON HCL 4 MG/2ML IJ SOLN
4.0000 mg | Freq: Once | INTRAMUSCULAR | Status: AC
  Administered 2018-08-19: 15:00:00 4 mg via INTRAVENOUS
  Filled 2018-08-19: qty 2

## 2018-08-19 MED ORDER — IOHEXOL 300 MG/ML  SOLN
100.0000 mL | Freq: Once | INTRAMUSCULAR | Status: AC | PRN
  Administered 2018-08-19: 17:00:00 100 mL via INTRAVENOUS

## 2018-08-19 MED ORDER — INSULIN ASPART 100 UNIT/ML ~~LOC~~ SOLN
0.0000 [IU] | Freq: Every day | SUBCUTANEOUS | Status: DC
  Administered 2018-08-21: 2 [IU] via SUBCUTANEOUS

## 2018-08-19 MED ORDER — TAMSULOSIN HCL 0.4 MG PO CAPS
0.4000 mg | ORAL_CAPSULE | Freq: Every day | ORAL | Status: DC
  Administered 2018-08-20 – 2018-08-23 (×4): 0.4 mg via ORAL
  Filled 2018-08-19 (×5): qty 1

## 2018-08-19 MED ORDER — CARVEDILOL 3.125 MG PO TABS
3.1250 mg | ORAL_TABLET | Freq: Two times a day (BID) | ORAL | Status: DC
  Administered 2018-08-19 – 2018-08-23 (×9): 3.125 mg via ORAL
  Filled 2018-08-19 (×9): qty 1

## 2018-08-19 MED ORDER — HYDROMORPHONE HCL 1 MG/ML IJ SOLN
1.0000 mg | Freq: Once | INTRAMUSCULAR | Status: AC
  Administered 2018-08-19: 15:00:00 1 mg via INTRAVENOUS
  Filled 2018-08-19: qty 1

## 2018-08-19 MED ORDER — FUROSEMIDE 10 MG/ML IJ SOLN
40.0000 mg | Freq: Once | INTRAMUSCULAR | Status: DC
  Administered 2018-08-19: 21:00:00 40 mg via INTRAVENOUS
  Filled 2018-08-19: qty 4

## 2018-08-19 MED ORDER — SODIUM CHLORIDE 0.9 % IV SOLN
INTRAVENOUS | Status: DC
  Administered 2018-08-19 – 2018-08-20 (×3): via INTRAVENOUS

## 2018-08-19 MED ORDER — ASPIRIN EC 81 MG PO TBEC
81.0000 mg | DELAYED_RELEASE_TABLET | Freq: Every day | ORAL | Status: DC
  Administered 2018-08-20 – 2018-08-23 (×4): 81 mg via ORAL
  Filled 2018-08-19 (×5): qty 1

## 2018-08-19 MED ORDER — ALBUTEROL SULFATE HFA 108 (90 BASE) MCG/ACT IN AERS
2.0000 | INHALATION_SPRAY | Freq: Two times a day (BID) | RESPIRATORY_TRACT | Status: DC
  Filled 2018-08-19: qty 6.7

## 2018-08-19 MED ORDER — ORAL CARE MOUTH RINSE
15.0000 mL | Freq: Two times a day (BID) | OROMUCOSAL | Status: DC
  Administered 2018-08-19 – 2018-08-24 (×10): 15 mL via OROMUCOSAL

## 2018-08-19 MED ORDER — BUDESONIDE 0.25 MG/2ML IN SUSP: 0.2500 mg | Freq: Two times a day (BID) | RESPIRATORY_TRACT | Status: DC

## 2018-08-19 MED ORDER — SODIUM CHLORIDE 0.9 % IV SOLN
2.0000 g | Freq: Two times a day (BID) | INTRAVENOUS | Status: DC
  Administered 2018-08-20 – 2018-08-22 (×6): 2 g via INTRAVENOUS
  Filled 2018-08-19 (×7): qty 2

## 2018-08-19 MED ORDER — UMECLIDINIUM BROMIDE 62.5 MCG/INH IN AEPB
1.0000 | INHALATION_SPRAY | Freq: Every day | RESPIRATORY_TRACT | Status: DC
  Administered 2018-08-21 – 2018-08-24 (×4): 1 via RESPIRATORY_TRACT
  Filled 2018-08-19: qty 7

## 2018-08-19 MED ORDER — HYDROMORPHONE HCL 1 MG/ML IJ SOLN
1.0000 mg | Freq: Once | INTRAMUSCULAR | Status: AC
  Administered 2018-08-19: 19:00:00 1 mg via INTRAVENOUS
  Filled 2018-08-19: qty 1

## 2018-08-19 MED ORDER — VITAMIN D (ERGOCALCIFEROL) 1.25 MG (50000 UNIT) PO CAPS: 50000.0000 [IU] | ORAL_CAPSULE | ORAL | Status: DC

## 2018-08-19 MED ORDER — FUROSEMIDE 10 MG/ML IJ SOLN
40.0000 mg | Freq: Once | INTRAMUSCULAR | Status: AC
  Administered 2018-08-20: 40 mg via INTRAVENOUS
  Filled 2018-08-19: qty 4

## 2018-08-19 MED ORDER — POTASSIUM CHLORIDE CRYS ER 20 MEQ PO TBCR
40.0000 meq | EXTENDED_RELEASE_TABLET | ORAL | Status: AC
  Administered 2018-08-19 – 2018-08-20 (×2): 40 meq via ORAL
  Filled 2018-08-19 (×2): qty 2

## 2018-08-19 MED ORDER — IPRATROPIUM-ALBUTEROL 0.5-2.5 (3) MG/3ML IN SOLN: 3.0000 mL | Freq: Four times a day (QID) | RESPIRATORY_TRACT | Status: DC

## 2018-08-19 MED ORDER — IPRATROPIUM-ALBUTEROL 0.5-2.5 (3) MG/3ML IN SOLN: 3.0000 mL | Freq: Four times a day (QID) | RESPIRATORY_TRACT | Status: DC | PRN

## 2018-08-19 MED ORDER — AMLODIPINE BESYLATE 5 MG PO TABS
10.0000 mg | ORAL_TABLET | Freq: Every day | ORAL | Status: DC
  Administered 2018-08-20 – 2018-08-22 (×3): 10 mg via ORAL
  Filled 2018-08-19 (×3): qty 2

## 2018-08-19 MED ORDER — SODIUM CHLORIDE 0.9 % IV BOLUS
1000.0000 mL | Freq: Once | INTRAVENOUS | Status: AC
  Administered 2018-08-19: 15:00:00 1000 mL via INTRAVENOUS

## 2018-08-19 MED ORDER — SODIUM CHLORIDE 0.9 % IV SOLN
1.0000 g | Freq: Once | INTRAVENOUS | Status: AC
  Administered 2018-08-19: 17:00:00 1 g via INTRAVENOUS
  Filled 2018-08-19: qty 10

## 2018-08-19 MED ORDER — ACETAMINOPHEN 325 MG PO TABS
650.0000 mg | ORAL_TABLET | Freq: Once | ORAL | Status: AC
  Administered 2018-08-19: 20:00:00 650 mg via ORAL
  Filled 2018-08-19: qty 2

## 2018-08-19 MED ORDER — PANTOPRAZOLE SODIUM 40 MG PO TBEC
40.0000 mg | DELAYED_RELEASE_TABLET | Freq: Every day | ORAL | Status: DC
  Administered 2018-08-21 – 2018-08-23 (×3): 40 mg via ORAL
  Filled 2018-08-19 (×3): qty 1

## 2018-08-19 MED ORDER — INSULIN ASPART 100 UNIT/ML ~~LOC~~ SOLN
0.0000 [IU] | Freq: Three times a day (TID) | SUBCUTANEOUS | Status: DC
  Administered 2018-08-21: 1 [IU] via SUBCUTANEOUS
  Administered 2018-08-21: 2 [IU] via SUBCUTANEOUS
  Administered 2018-08-22: 1 [IU] via SUBCUTANEOUS
  Administered 2018-08-22: 3 [IU] via SUBCUTANEOUS
  Administered 2018-08-22: 17:00:00 2 [IU] via SUBCUTANEOUS
  Administered 2018-08-23 (×2): 1 [IU] via SUBCUTANEOUS

## 2018-08-19 MED ORDER — MOMETASONE FURO-FORMOTEROL FUM 200-5 MCG/ACT IN AERO
2.0000 | INHALATION_SPRAY | Freq: Two times a day (BID) | RESPIRATORY_TRACT | Status: DC
  Administered 2018-08-20 – 2018-08-24 (×10): 2 via RESPIRATORY_TRACT
  Filled 2018-08-19: qty 8.8

## 2018-08-19 MED ORDER — LATANOPROST 0.005 % OP SOLN
1.0000 [drp] | Freq: Every day | OPHTHALMIC | Status: DC
  Administered 2018-08-20 – 2018-08-23 (×4): 1 [drp] via OPHTHALMIC
  Filled 2018-08-19: qty 2.5

## 2018-08-19 NOTE — H&P (Signed)
History and Physical  Gualberto Wahlen RJJ:[id number] DOB: 1945/02/24 DOA: 08/13/2018  Referring physician: ER provider, Dr. Dorie Rank PCP: [name], MD  Outpatient Specialists: Patient did not follow with oncology on discharge Patient coming from: Home  Chief Complaint: Abdominal pain, shortness of breath and low-grade fever  HPI:  Patient is a 73 year old male with multiple cardiac and medical problems.  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  Patient was discharged from this hospital on 08/06/2018 after inpatient care for UTI sepsis, bladder cancer with liver biopsy done on 08/04/2018 revealing neoplastic cells and ischemic necrosis.  On discharge from the hospital on 08/06/2018, the plan was for the patient to follow-up with the oncology team but patient did not.  Patient was seen alongside patient's daughter who assisted with interpretation.  Patient is not a particularly good historian.  Patient presents with abdominal pain, shortness of breath and low-grade fever.  Pertinent work-up done so far revealed hemoglobin of 6 g/dL, down from 10.4 g/dL documented on 08/05/2018, UA revealed significant RBC per high-power field (greater than 50), with negative stool occult blood.  UA also revealed large leukocyte, greater than 50 WBC but rare bacteria.  Liver function test revealed worsening alkaline phosphatase of 642 (up from 457), albumin of 2.1, AST of 129 (up from 72), AST of 67 (up from 46), total bilirubin of 2.5 (up from 0.6).  CT scan of the abdomen and pelvis with contrast done revealed "Numerous hypodense liver masses seen throughout which appear larger and more  numerous in size from the prior exam August 01, 2018.  This is concerning for infectious liver abscesses.  New small amount perihepatic and abdominal ascites.  Right-sided double-J ureteral stent with mild hydronephrosis  Bladder wall thickening/soft tissue nodule as on the prior exam".  No headache, no neck pain, no URI symptoms, no chest pain, no vomiting or change in bowel habit.  Patient has nausea.  Patient is currently being transfused with packed red blood cells.  Hospitalist team has been asked to admit patient for further assessment and management.  ED Course:  On presentation to the hospital, T-max of 102.4 F was documented, heart rate of 92 to 103 bpm, blood pressure of 174/70, heart rate of 18 and O2 sat of 98%. Labs reveal potassium of 3.4, BUN of 24, serum creatinine of 1.28, alkaline phosphatase of 642, bilirubin of 2.5, WBC of 11.5, hemoglobin of 6, platelet count of 163.  CT abdomen and pelvis is as documented above.  Patient is currently being transfused with packed red blood cells.  UA is as documented above.  IV Rocephin was given by the ER provider.  Hospitalist team was asked to admit patient for further assessment and management.  Pertinent labs: Please see above.  Imaging: independently reviewed.   Review of Systems:  Negative for fever, visual changes, sore throat, rash, new muscle aches, chest pain, dysuria, bleeding, vomiting abdominal pain.  Please note patient is a poor historian.   Past Medical History:  Diagnosis Date   Allergic rhinitis    Aortic atherosclerosis ([place]) 08/03/2016   Noted on CXR   CAD S/P BMS PCI (mid RCA, prox RCA & RPAV)    cardiologist-  Dr. [name]: a). NSTEMI/[place] 06/2009: Mod  CAD & (-) Myoview; b). 02/2012: Inf STEMI s/p Veriflex BMS PCI (3.5 x 12 - 3.75 mm) -mid RCA. c). Exertional Angina / + MYOVIEW -->  BMS PCI x 2- prox RCA (Veriflex BMS 4.0 x 12), rPAV (3.0 x 12);; d) LAST CATH 12/2014: 40% ost LAD, 80% ostial D2, 50% midCx.  Patent RCA -  RPAV/PL stents.  Medical Rx   Centriacinar emphysema [facility])    Chest pain x 1 in last 6 months   COPD, group C, by GOLD 2017 classification [facility]) pulmonologist-- dr mcquid   former smoker--- severe OSA w/ hx multiple hospital admissions w/ exacerbation's   Dysuria    Emphysema lung ([place]) 08/03/2016   Noted on CXR   Essential hypertension    Full dentures    GERD (gastroesophageal reflux disease)    Grade I diastolic dysfunction 50/35/4656   noted on ECHO   Hematuria 04/02/2018   History of cardiomegaly    Noted on CXR 07/28/2017   History of non-ST elevation myocardial infarction (NSTEMI) 06/19/2009   in setting COPD w/ exacerbation;  negative myoview for ischemia;  previously had cardiac cath 06-08-2009 w/ diffuse nonobtructive CAD and  normal LVF   History of prostatitis    History of ST elevation myocardial infarction (STEMI) 02/15/2012   in setting of COPD w/ exacerbation; per cardiac cath PCI and stenting to RCA   Hyperlipidemia    Lower abdominal pain    LVH (left ventricular hypertrophy) 07/27/2017   Moderate, noted on ECHO   Pneumonia 07/28/2017   Noted on CXR    Pulmonary nodules 03/05/2018   Stable 4 mm left upper lobe nodule   Recurrent malignant neoplasm of bladder [facility]) urologist-  dr [name]   previously dx 08/ 2018 and 10/ 2018 w/ high grade urothelial carcinoma with no muscle involvement   Type 2 diabetes mellitus ([place])    Wears glasses     Past Surgical History:  Procedure Laterality Date   CARDIAC CATHETERIZATION N/A 12/27/2014   Procedure: Left Heart Cath and Coronary Angiography;  Surgeon: Jettie Booze, MD;  Location: [facility];  Service: Cardiovascular;  Laterality: N/A; Ost-prox LAD 40%, ost D2 (small) 80%, mCFx 50%, mild to moderate left sided disease, patent RCA (p, m & RPAV) stents, normal LVF   COLONOSCOPY     CYSTOSCOPY W/ RETROGRADES Bilateral 09/07/2016   Procedure: CYSTOSCOPY WITH BILATERAL RETROGRADE  PYELOGRAM;  Surgeon: [name], MD;  Location: [facility];  Service: Urology;  Laterality: Bilateral;   CYSTOSCOPY W/ RETROGRADES Bilateral 03/15/2017   Procedure: CYSTOSCOPY WITH RETROGRADE PYELOGRAM;  Surgeon: [name], MD;  Location: [facility];  Service: Urology;  Laterality: Bilateral;   CYSTOSCOPY WITH RETROGRADE PYELOGRAM, URETEROSCOPY AND STENT PLACEMENT Left 10/19/2016   Procedure: CYSTOSCOPY WITH RETROGRADE PYELOGRAM, URETEROSCOPY AND STENT PLACEMENT;  Surgeon: [name], MD;  Location: [facility];  Service: Urology;  Laterality: Left;   CYSTOSCOPY WITH URETEROSCOPY Left 06/06/2018   Procedure: CYSTOSCOPY WITH LEFT RETROGRADE PYELOGRAM;  Surgeon: [name], MD;  Location: [facility];  Service: Urology;  Laterality: Left;   ESOPHAGOGASTRODUODENOSCOPY  2004   IR NEPHROSTOGRAM RIGHT THRU EXISTING ACCESS  07/14/2018   IR NEPHROSTOMY EXCHANGE RIGHT  07/07/2018   IR NEPHROSTOMY PLACEMENT RIGHT  06/06/2018   IR URETERAL STENT PLACEMENT EXISTING ACCESS RIGHT  07/07/2018   LEFT HEART CATH AND CORONARY ANGIOGRAPHY  06/08/2009   Dr. Burt Knack: LAD 40-50%, LCX 40%, RCA 25% EF 65%m -thought to be related to  coronary spasm.   LEFT HEART CATHETERIZATION WITH CORONARY ANGIOGRAM N/A 02/15/2012   Procedure: LEFT HEART CATHETERIZATION WITH CORONARY ANGIOGRAM;  Surgeon: Peter M [name], MD;  Location: [facility] CATH LAB;  Service: Cardiovascular; inferior STEMI:  mid RCA 99% (BMS PCI), normal LVF, diffuse 20-30% distal RCA and 50% RPL/PAB.  Mid LCx 50-60%.  Mid LAD 30-40% at D1 with ostial D1 50%   LEFT HEART CATHETERIZATION WITH CORONARY ANGIOGRAM N/A 07/01/2012   Procedure: LEFT HEART CATHETERIZATION WITH CORONARY ANGIOGRAM;  Surgeon: Peter M [name], MD;  Location: [facility] CATH LAB;  Service: Cardiovascular:: CLASS III ANGINA & + MYOVIEW (inf Ischemia) -- pRCA RCA 90% (BMS PCI), widely patent mid RCA BMS & diffuse 30-40% dist RCA, RPAV focal 80% (BMS PCI).  30% P-M LAD.  20%  circumflex marginal.  EF 55-65%.   LEFT HEART CATHETERIZATION WITH CORONARY ANGIOGRAM N/A 08/03/2013   Procedure: LEFT HEART CATHETERIZATION WITH CORONARY ANGIOGRAM;  Surgeon: Peter M [name], MD;  Location[facility] C[facility] LAB;  Service: Cardiovascular;  Laterality: N/A;  nonobstuctive CAD, --  Mild LAD (mid 40%).  Mild circumflex.  Patent mid RCA and PAB stents with mild diffuse 30% disease elsewhere (~30% RPAV ISR).  Normal LV function.[name]    NM MYOVIEW LTD  06/25/2012   Dr. [name]: INTERMEDIATE RISK - w/ medium-sized, mild primary reversible basal to mid inferior perfusion defect suggestive of ischemia/  normal LV function and wall motion ,ef 63% --> lead to CATH & BMS PCI of  pRCA & RPAV.   PERCUTANEOUS CORONARY STENT INTERVENTION (PCI-S) N/A 02/15/2012   Procedure: PERCUTANEOUS CORONARY STENT INTERVENTION (PCI-S);  Surgeon: Peter M [name], MD;  Location: [facility] CATH LAB;  Service: Cardiovascular:: mRCA 99% -- Veriflex BMS 3.0 x 12 (3.75 mm) -insetting of inferior STEMI   PERCUTANEOUS CORONARY STENT INTERVENTION (PCI-S)  07/01/2012   Procedure: PERCUTANEOUS CORONARY STENT INTERVENTION (PCI-S);  Surgeon: Peter M [name], MD;  Location: [facility] CATH LAB;  Service: Cardiovascular;;  pRCA PCI - Veriflex BMS 4.0 x 12; RPAV PCI - Veriflex BMS 3.0 x 12    stents to heart x 2     TRANSTHORACIC ECHOCARDIOGRAM  07/2017   Mod LVH, EF 50-55%, Gr 1 DD. mild PR and TR, trivial pericardial effusion (no change from 07/2016)   TRANSURETHRAL RESECTION OF BLADDER TUMOR N/A 09/07/2016   Procedure: TRANSURETHRAL RESECTION OF BLADDER TUMOR (TURBT);  Surgeon: [name], MD;  [facility];  Service: Urology;  Laterality: N/A;   TRANSURETHRAL RESECTION OF BLADDER TUMOR N/A 10/19/2016   Procedure: TRANSURETHRAL RESECTION OF BLADDER TUMOR (TURBT);  Surgeon: [name], MD;  [facility];  Service: Urology;  Laterality: N/A;   TRANSURETHRAL RESECTION OF BLADDER TUMOR N/A 03/15/2017   Procedure: TRANSURETHRAL  RESECTION OF BLADDER TUMOR (TURBT);  Surgeon: [name], MD;  Location: [facility];  Service: Urology;  Laterality: N/A;   TRANSURETHRAL RESECTION OF BLADDER TUMOR N/A 06/06/2018   Procedure: TRANSURETHRAL RESECTION OF BLADDER TUMOR (TURBT);  Surgeon: [name], MD;  [facility];  Service: Urology;  Laterality: N/A;     reports that he quit smoking about 10 years ago. His smoking use included cigarettes. He has a 50.00 pack-year smoking history. He has never used smokeless tobacco. He reports previous alcohol use. He reports that he does not use drugs.  No Known Allergies  Family History  Problem Relation Age of Onset   CAD Neg Hx        no known FHx of early  CAD     Prior to Admission medications   Medication Sig Start Date End Date Taking? Authorizing Provider  acetaminophen (TYLENOL) 325 MG tablet Take 2 tablets (650 mg total) by mouth every 6 (six) hours as needed for mild pain (or Fever >/= 101). 08/06/18  Yes Eugenie Filler, MD  albuterol (PROVENTIL HFA) 108 (90 BASE) MCG/ACT inhaler Inhale 2 puffs into the lungs 2 (two) times daily. Patient taking differently: Inhale 2 puffs into the lungs every 6 (six) hours as needed for wheezing or shortness of breath.  05/06/14  Yes Robbie Lis, MD  albuterol (PROVENTIL) (2.5 MG/3ML) 0.083% nebulizer solution Take 2.5 mg by nebulization every 6 (six) hours as needed for wheezing or shortness of breath.   Yes [provider]  amLODipine (NORVASC) 10 MG tablet Take 10 mg by mouth daily.  04/07/17  Yes [provider]  aspirin EC 81 MG tablet Take 81 mg by mouth daily.   Yes [provider]  carvedilol (COREG) 3.125 MG tablet Take 1 tablet (3.125 mg total) by mouth 2 (two) times daily. 08/06/18  Yes Eugenie Filler, MD  folic acid (FOLVITE) 1 MG tablet Take 1 mg by mouth daily. 04/20/18  Yes [provider]  ibuprofen (ADVIL) 200 MG tablet Take 400 mg by mouth 2 (two)  times a day.   Yes [provider]  latanoprost (XALATAN) 0.005 % ophthalmic solution Place 1 drop into both eyes at bedtime. 07/14/18  Yes [provider]  meloxicam (MOBIC) 15 MG tablet Take 15 mg by mouth daily. 08/07/18  Yes [provider]  metFORMIN (GLUCOPHAGE) 500 MG tablet Take 500 mg by mouth 2 (two) times daily with a meal.    Yes [provider]  nitroGLYCERIN (NITROSTAT) 0.4 MG SL tablet Place 1 tablet (0.4 mg total) under the tongue every 5 (five) minutes as needed for chest pain. 01/05/15  Yes [name], PA  pantoprazole (PROTONIX) 40 MG tablet Take 1 tablet (40 mg total) by mouth daily at 6 (six) AM. 08/07/18  Yes Eugenie Filler, MD  potassium chloride SA (K-DUR) 20 MEQ tablet Take 20 mEq by mouth daily.   Yes [provider]  rosuvastatin (CRESTOR) 40 MG tablet Take 40 mg by mouth daily. 11/29/17  Yes [provider]  senna-docusate (SENOKOT-S) 8.6-50 MG tablet Take 1 tablet by mouth 2 (two) times daily. 08/06/18  Yes Eugenie Filler, MD  SYMBICORT 160-4.5 MCG/ACT inhaler Inhale 2 puffs into the lungs 2 (two) times a day.  05/29/17  Yes [provider]  tamsulosin (FLOMAX) 0.4 MG CAPS capsule Take 2 capsules (0.8 mg total) by mouth every evening. Patient taking differently: Take 0.4 mg by mouth daily after breakfast.  08/08/16  Yes Waldron Session, MD  Tiotropium Bromide Monohydrate (SPIRIVA RESPIMAT) 2.5 MCG/ACT AERS 2 pffs each am Patient taking differently: Take 2 puffs by mouth every morning. 2 pffs each am 06/03/18  Yes Tanda Rockers, MD  Vitamin D, Ergocalciferol, (DRISDOL) 50000 units CAPS capsule Take 50,000 Units by mouth once a week.  03/28/17  Yes [provider]  budesonide (PULMICORT) 0.5 MG/2ML nebulizer solution Take 2 mLs (0.5 mg total) by nebulization 2 (two) times daily. DX:  496 02/07/11 03/21/11  Parrett, Fonnie Mu, NP  esomeprazole (NEXIUM) 40 MG capsule Take 1 capsule (40 mg total) by mouth daily.  12/28/10 03/21/11  Elsie Stain, MD    Physical Exam: Vitals:   08/17/2018 1600 08/14/2018 1607 08/13/2018 1630  09/07/2018 1701  BP: (!) 152/72 (!) 152/72 (!) 148/72 (!) 165/77  Pulse: 92 93 92 (!) 102  Resp: 20 18 (!) 22 (!) 21  Temp:  98.4 F (36.9 C)    TempSrc:  Oral    SpO2: 93% 96% 98% 97%   Constitutional:   Acute on chronically ill looking.   Eyes:   Pallor and jaundice.    ENMT:   external ears, nose appear normal Neck:   Neck is supple. No JVD Respiratory:   Decreased air entry with expiratory wheeze.   Cardiovascular:   S1S2 Abdomen:   Abdomen is obese, soft with vague suprapubic, right upper quadrant and epigastric tenderness.  Organs are difficult to assess. Neurologic:   Awake and alert.  Moves all limbs.  Wt Readings from Last 3 Encounters:  08/01/18 63 kg  07/31/18 63.3 kg  06/06/18 69.9 kg    I have personally reviewed following labs and imaging studies  Labs on Admission:  CBC: Recent Labs  Lab 08/16/2018 1449  WBC 11.5*  NEUTROABS 9.7*  HGB 6.0*  HCT 19.4*  MCV 78.2*  PLT 503   Basic Metabolic Panel: Recent Labs  Lab 08/27/2018 1449  NA 135  K 3.4*  CL 103  CO2 22  GLUCOSE 170*  BUN 24*  CREATININE 1.28*  CALCIUM 7.6*   Liver Function Tests: Recent Labs  Lab 08/21/2018 1449  AST 129*  ALT 67*  ALKPHOS 642*  BILITOT 2.5*  PROT 5.6*  ALBUMIN 2.1*   Recent Labs  Lab 09/07/2018 1449  LIPASE 16   No results for input(s): AMMONIA in the last 168 hours. Coagulation Profile: No results for input(s): INR, PROTIME in the last 168 hours. Cardiac Enzymes: No results for input(s): CKTOTAL, CKMB, CKMBINDEX, TROPONINI in the last 168 hours. BNP (last 3 results) No results for input(s): PROBNP in the last 8760 hours. HbA1C: No results for input(s): HGBA1C in the last 72 hours. CBG: No results for input(s): GLUCAP in the last 168 hours. Lipid Profile: No results for input(s): CHOL, HDL, LDLCALC, TRIG, CHOLHDL, LDLDIRECT in  the last 72 hours. Thyroid Function Tests: No results for input(s): TSH, T4TOTAL, FREET4, T3FREE, THYROIDAB in the last 72 hours. Anemia Panel: No results for input(s): VITAMINB12, FOLATE, FERRITIN, TIBC, IRON, RETICCTPCT in the last 72 hours. Urine analysis:    Component Value Date/Time   COLORURINE AMBER (A) 08/16/2018 1523   APPEARANCEUR HAZY (A) 08/25/2018 1523   LABSPEC 1.015 08/22/2018 1523   PHURINE 5.0 08/14/2018 1523   GLUCOSEU NEGATIVE 09/03/2018 1523   GLUCOSEU NEGATIVE 03/01/2015 1719   HGBUR LARGE (A) 08/18/2018 1523   BILIRUBINUR NEGATIVE 08/31/2018 1523   KETONESUR NEGATIVE 08/23/2018 1523   PROTEINUR 30 (A) 08/12/2018 1523   UROBILINOGEN 0.2 03/01/2015 1719   NITRITE NEGATIVE 08/12/2018 1523   LEUKOCYTESUR LARGE (A) 09/06/2018 1523   Sepsis Labs: @LABRCNTIP (procalcitonin:4,lacticidven:4) )No results found for this or any previous visit (from the past 240 hour(s)).    Radiological Exams on Admission: Ct Abdomen Pelvis W Contrast  Result Date: 08/29/2018 CLINICAL DATA:  Abdominal pain, known malignancy, recurrence neoplasm of the bladder EXAM: CT ABDOMEN AND PELVIS WITH CONTRAST TECHNIQUE: Multidetector CT imaging of the abdomen and pelvis was performed using the standard protocol following bolus administration of intravenous contrast. CONTRAST:  121mL OMNIPAQUE IOHEXOL 300 MG/ML  SOLN COMPARISON:  August 01, 2018 FINDINGS: Lower chest: There is mild cardiomegaly. There is streaky atelectasis or scarring at the right lung base. No hiatal hernia. The visualized portions  of the lungs are clear. Hepatobiliary: Multiple peripherally enhancing hypodense liver masses are seen throughout the liver parenchyma they have increased in size and number from the prior exam. For reference in the inferior right liver lobe the mass measured 1.2 cm on series 2, image 72, and now measures 3.1 cm seen on series 2, image 44. The main portal vein is patent. There is a moderate amount of  perihepatic ascites. No evidence of calcified gallstones, gallbladder wall thickening or biliary dilatation. Pancreas: Unremarkable. No pancreatic ductal dilatation or surrounding inflammatory changes. Spleen: Normal in size without focal abnormality. Adrenals/Urinary Tract: Both adrenal glands appear normal. Again noted is a left-sided double-J ureteral stent. There is moderate right hydronephrosis which is slightly decreased from the prior exam. There is bladder wall thickening, predominantly within the right bladder wall and bladder dome which appears unchanged from prior. Stomach/Bowel: The stomach, small bowel, and colon are normal in appearance. No inflammatory changes, wall thickening, or obstructive findings. Vascular/Lymphatic: There are no enlarged mesenteric, retroperitoneal, or pelvic lymph nodes. Scattered aortic atherosclerotic calcifications are seen without aneurysmal dilatation. Reproductive: The prostate is unremarkable. Other: No evidence of abdominal wall mass or hernia. Musculoskeletal: No acute or significant osseous findings. IMPRESSION: 1. Numerous hypodense liver masses seen throughout which appear larger and more numerous in size from the prior exam August 01, 2018. This is concerning for infectious liver abscesses. 2. New small amount perihepatic and abdominal ascites. 3. Right-sided double-J ureteral stent with mild hydronephrosis 4. Bladder wall thickening/soft tissue nodule as on the prior exam. Electronically Signed   By: Prudencio Pair M.D.   On: 08/31/2018 18:15   Dg Chest Portable 1 View  Result Date: 08/12/2018 CLINICAL DATA:  RIGHT upper quadrant pain, distension and tenderness, shortness of breath. History of kidney cancer with hepatic metastases. EXAM: PORTABLE CHEST 1 VIEW COMPARISON:  Chest x-rays dated 08/01/2018 and 07/28/2017. FINDINGS: Heart size is within normal limits. Again noted is the RIGHT upper lobe pulmonary mass, not appreciably changed compared to most recent  chest x-ray of 08/01/2018, better demonstrated on chest CT of 08/01/2018. Least mild mediastinal prominence corresponding to the mediastinal and hilar lymph node metastases described on earlier chest CT. No new lung findings. No pleural effusion or pneumothorax seen. Osseous structures about the chest are unremarkable. IMPRESSION: 1. No acute findings.  No evidence of pneumonia or pulmonary edema. 2. Grossly stable appearance of patient's previously demonstrated RIGHT upper lobe pulmonary mass. The mediastinal and hilar prominence is compatible with the previously demonstrated lymph node metastases. Electronically Signed   By: Franki Cabot M.D.   On: 08/23/2018 15:59    Active Problems:   * No active hospital problems. *   Assessment/Plan: Abdominal pain: Admit patient for further assessment and management. Patient has history of bladder cancer with metastatic to liver and lung disease. Worsening liver metastasis is noted. Pain could be from worsening liver metastasis. -Patient is also noted to have elevated alkaline phosphatase and elevated bilirubin -Rule out biliary sepsis/obstructive jaundice -Check direct bilirubin -Adequate pain control -Low threshold to consult GI and oncology team -Further management depend on hospital course -Continue to discuss goal of care with patient and patient's family  Bladder cancer with metastatic liver and lung disease:  -Patient is currently having hematuria -Please consult urology in the morning -Transfuse packed red blood cells PRN -Further management depend on hospital course.  Acute blood anemia likely secondary to hematuria from bladder cancer: -Patient currently has significant hematuria -Continue to monitor closely. -Monitor H&H. -  Transfuse packed red blood cells for hemoglobin less than 7 g/dL.  Fever: -Could be multifactorial. -Rule out biliary sepsis -Patient has recent history of UTI/sepsis -Panculture patient -Could also be  secondary to tumor load/malignancy -Further management will depend on hospital course -Start patient on broad-spectrum antibiotics.  History of recent UTI/sepsis: Repeat urine culture Antibiotics as above.  Shortness of breath: Likely multifactorial Patient has history of COPD. Patient is currently wheezing. We will treat COPD Patient also has metastasis to the lungs Further management will depend on above.  COPD with possible mild exacerbation: -See above.  Chronic kidney disease stage III: -Stable.  Mild hypokalemia: -Monitor and replete.  Overall, patient's prognosis is guarded.  DVT prophylaxis: SCD Code Status: Full code for now.  Continue to discuss with patient and patient's daughter Family Communication: Patient daughter Disposition Plan: This will depend on hospital course Consults called: Please consult GI, oncology and urology in the morning Admission status: Inpatient  Time spent: 65 minutes  Dana Allan, MD  [facility] Pager #: [phone] 7PM-7AM contact night coverage as above  08/15/2018, 7:39 PM

## 2018-08-19 NOTE — ED Notes (Signed)
Blood bank has 2 units of blood ready for this pt. Informed Angela,RN.

## 2018-08-19 NOTE — Progress Notes (Signed)
Pharmacy Antibiotic Note  Rithwik Schmieg is a 73 y.o. male admitted on 09/07/2018 with intra-abdominal infection.  Pharmacy has been consulted for Cefepime dosing.  09/05/2018:  Tm 102.50F  WBC elevated- 12.6  Scr 1.28, est CrCl ~4ml/min (Last documented weight 63kg on 08/01/18)  Plan: Cefepime 2gm IV q12h     Temp (24hrs), Avg:101 F (38.3 C), Min:98.4 F (36.9 C), Max:102.4 F (39.1 C)  Recent Labs  Lab 08/23/2018 1449 09/08/2018 1454 09/05/2018 1925  WBC 11.5*  --  12.6*  CREATININE 1.28*  --   --   LATICACIDVEN  --  1.7  --     CrCl cannot be calculated (Unknown ideal weight.).    No Known Allergies  Antimicrobials this admission: 8/11 Cefepime >>   Dose adjustments this admission:  Microbiology results: 8/11 BCx:  8/11 UCx:    Thank you for allowing pharmacy to be a part of this patient's care.  Biagio Borg 08/14/2018 9:07 PM

## 2018-08-19 NOTE — ED Notes (Signed)
ED TO INPATIENT HANDOFF REPORT  Name/Age/Gender [name] 73 y.o. male  Code Status Code Status History    Date Active Date Inactive Code Status Order ID Comments User Context   08/01/2018 2002 08/06/2018 1944 Full Code [id number]  Barb Merino, MD Inpatient   07/27/2017 0227 07/28/2017 1718 Full Code [id number]  Rise Patience, MD Inpatient   08/03/2016 1120 08/08/2016 1826 Full Code [id number]  Rondel Jumbo, PA-C ED   06/01/2015 1440 06/05/2015 1633 Full Code [id number]  Willia Craze, NP ED   12/27/2014 0924 12/27/2014 2029 Full Code [id number]  Jettie Booze, MD Inpatient   12/23/2014 1744 12/27/2014 0924 Full Code [id number]  Thurnell Lose, MD Inpatient   05/01/2014 0243 05/06/2014 1235 Full Code [id number]  Theressa Millard, MD Inpatient   08/26/2013 1837 08/31/2013 1954 Full Code [id number]  Donne Hazel, MD ED   08/03/2013 1620 08/04/2013 1834 Full Code [id number]  [name], Peter M, MD Inpatient   08/01/2013 1652 08/03/2013 1608 Full Code [id number]  [name], Peter M, MD Inpatient   12/20/2010 1326 12/27/2010 1835 Full Code [id number]  Cheryln Manly, RN ED   Advance Care Planning Activity      Home/SNF/Other Home  Chief Complaint flank pain  Level of Care/Admitting Diagnosis ED Disposition    ED Disposition Condition [facility]: [facility] [[id number]]  Level of Care: Stepdown [14]  Admit to SDU based on following criteria: Hemodynamic compromise or significant risk of instability:  Patient requiring short term acute titration and management of vasoactive drips, and invasive monitoring (i.e., CVP and Arterial line).  Admit to SDU based on following criteria: Respiratory Distress:  Frequent assessment and/or intervention to maintain adequate ventilation/respiration, pulmonary toilet, and respiratory treatment.  Covid Evaluation: Asymptomatic Screening Protocol (No Symptoms)  Diagnosis: Abdominal pain [025427]  Admitting  Physician: Bonnell Public [[id number]]  Attending Physician: Dana Allan I [[id number]]  Estimated length of stay: 3 - 4 days  Certification:: I certify this patient will need inpatient services for at least 2 midnights  PT Class (Do Not Modify): Inpatient [101]  PT Acc Code (Do Not Modify): Private [1]       Medical History Past Medical History:  Diagnosis Date  . Allergic rhinitis   . Aortic atherosclerosis ([place]) 08/03/2016   Noted on CXR  . CAD S/P BMS PCI (mid RCA, prox RCA & RPAV)    cardiologist-  Dr. [name]: a). NSTEMI/Demand 06/2009: Mod CAD & (-) Myoview; b). 02/2012: Inf STEMI s/p Veriflex BMS PCI (3.5 x 12 - 3.75 mm) -mid RCA. c). Exertional Angina / + MYOVIEW -->  BMS PCI x 2- prox RCA (Veriflex BMS 4.0 x 12), rPAV (3.0 x 12);; d) LAST CATH 12/2014: 40% ost LAD, 80% ostial D2, 50% midCx.  Patent RCA - RPAV/PL stents.  Medical Rx  . Centriacinar emphysema ([place])   . Chest pain x 1 in last 6 months  . COPD, group C, by GOLD 2017 classification [facility]) pulmonologist-- dr mcquid   former smoker--- severe OSA w/ hx multiple hospital admissions w/ exacerbation's  . Dysuria   . Emphysema lung ([place]) 08/03/2016   Noted on CXR  . Essential hypertension   . Full dentures   . GERD (gastroesophageal reflux disease)   . Grade I diastolic dysfunction 07/01/7626   noted on ECHO  . Hematuria 04/02/2018  . History of cardiomegaly    Noted on CXR 07/28/2017  . History of non-ST elevation myocardial infarction (  NSTEMI) 06/19/2009   in setting COPD w/ exacerbation;  negative myoview for ischemia;  previously had cardiac cath 06-08-2009 w/ diffuse nonobtructive CAD and  normal LVF  . History of prostatitis   . History of ST elevation myocardial infarction (STEMI) 02/15/2012   in setting of COPD w/ exacerbation; per cardiac cath PCI and stenting to RCA  . Hyperlipidemia   . Lower abdominal pain   . LVH (left ventricular hypertrophy) 07/27/2017   Moderate, noted on ECHO  . Pneumonia 07/28/2017    Noted on CXR   . Pulmonary nodules 03/05/2018   Stable 4 mm left upper lobe nodule  . 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 Type 2 diabetes mellitus ([place])   . Wears glasses     Allergies No Known Allergies  IV Location/Drains/Wounds Patient Lines/Drains/Airways Status   Active Line/Drains/Airways    Name:   Placement date:   Placement time:   Site:   Days:   Peripheral IV 08/12/2018 Left Antecubital   09/03/2018    1514    Antecubital   less than 1   Peripheral IV 08/27/2018 Right Hand   08/15/2018    1212    Hand   less than 1   Ureteral Drain/Stent Left ureter 6 Fr.   10/19/16    0846    Left ureter   669   Incision (Closed) 10/19/16 Penis   10/19/16    0903     669   Incision (Closed) 03/15/17 Penis   03/15/17    0728     522   Incision (Closed) 06/06/18 N/A   06/06/18    1232     74          Labs/Imaging Results for orders placed or performed during the hospital encounter of 08/18/2018 (from the past 48 hour(s))  Comprehensive metabolic panel     Status: Abnormal   Collection Time: 09/07/2018  2:49 PM  Result Value Ref Range   Sodium 135 135 - 145 mmol/L   Potassium 3.4 (L) 3.5 - 5.1 mmol/L   Chloride 103 98 - 111 mmol/L   CO2 22 22 - 32 mmol/L   Glucose, Bld 170 (H) 70 - 99 mg/dL   BUN 24 (H) 8 - 23 mg/dL   Creatinine, Ser 1.28 (H) 0.61 - 1.24 mg/dL   Calcium 7.6 (L) 8.9 - 10.3 mg/dL   Total Protein 5.6 (L) 6.5 - 8.1 g/dL   Albumin 2.1 (L) 3.5 - 5.0 g/dL   AST 129 (H) 15 - 41 U/L   ALT 67 (H) 0 - 44 U/L   Alkaline Phosphatase 642 (H) 38 - 126 U/L   Total Bilirubin 2.5 (H) 0.3 - 1.2 mg/dL   GFR calc non Af Amer 55 (L) >60 mL/min   GFR calc Af Amer >60 >60 mL/min   Anion gap 10 5 - 15    Comment: Performed at [facility], [place] [address], [place], [place] [address]  Lipase, blood     Status: None   Collection Time:  08/28/2018  2:49 PM  Result Value Ref Range   Lipase 16 11 - 51 U/L    Comment: Performed at [facility], [place] [address], [place], [place] [address]  CBC with Diff     Status: Abnormal   Collection Time: 08/26/2018  2:49 PM  Result  Value Ref Range   WBC 11.5 (H) 4.0 - 10.5 K/uL   RBC 2.48 (L) 4.22 - 5.81 MIL/uL   Hemoglobin 6.0 (LL) 13.0 - 17.0 g/dL    Comment: REPEATED TO VERIFY THIS CRITICAL RESULT HAS VERIFIED AND BEEN CALLED TO BULLOCKSKI,A RN BY MARINDA BLACK ON 08 11 2020 AT 1620, AND HAS BEEN READ BACK. CRITCAL HGB RBV BULLOCKSKI, A RN     HCT 19.4 (L) 39.0 - 52.0 %   MCV 78.2 (L) 80.0 - 100.0 fL   MCH 24.2 (L) 26.0 - 34.0 pg   MCHC 30.9 30.0 - 36.0 g/dL   RDW 18.0 (H) 11.5 - 15.5 %   Platelets 163 150 - 400 K/uL   nRBC 0.0 0.0 - 0.2 %   Neutrophils Relative % 85 %   Neutro Abs 9.7 (H) 1.7 - 7.7 K/uL   Lymphocytes Relative 5 %   Lymphs Abs 0.6 (L) 0.7 - 4.0 K/uL   Monocytes Relative 9 %   Monocytes Absolute 1.1 (H) 0.1 - 1.0 K/uL   Eosinophils Relative 0 %   Eosinophils Absolute 0.0 0.0 - 0.5 K/uL   Basophils Relative 0 %   Basophils Absolute 0.0 0.0 - 0.1 K/uL   Immature Granulocytes 1 %   Abs Immature Granulocytes 0.15 (H) 0.00 - 0.07 K/uL    Comment: Performed at [facility], [place] [address] [place], [place] [address]  Lactic acid, plasma     Status: None   Collection Time: 09/03/2018  2:54 PM  Result Value Ref Range   Lactic Acid, Venous 1.7 0.5 - 1.9 mmol/L    Comment: Performed at [facility], [place] [address], [place], [place] [address]  Urinalysis, Routine w reflex microscopic     Status: Abnormal   Collection Time: 08/20/2018  3:23 PM  Result Value Ref Range   Color, Urine AMBER (A) YELLOW    Comment: BIOCHEMICALS MAY BE AFFECTED BY COLOR   APPearance HAZY (A) CLEAR   Specific Gravity, Urine 1.015 1.005 - 1.030   pH 5.0 5.0 - 8.0   Glucose, UA NEGATIVE NEGATIVE mg/dL   Hgb urine dipstick LARGE (A)  NEGATIVE   Bilirubin Urine NEGATIVE NEGATIVE   Ketones, ur NEGATIVE NEGATIVE mg/dL   Protein, ur 30 (A) NEGATIVE mg/dL   Nitrite NEGATIVE NEGATIVE   Leukocytes,Ua LARGE (A) NEGATIVE   RBC / HPF >50 (H) 0 - 5 RBC/hpf   WBC, UA >50 (H) 0 - 5 WBC/hpf   Bacteria, UA RARE (A) NONE SEEN   Squamous Epithelial / LPF 0-5 0 - 5   Mucus PRESENT     Comment: Performed at [facility], [place] [address] [place], [place] [address]  Type and screen [place]     Status: None (Preliminary result)   Collection Time: 09/01/2018  5:18 PM  Result Value Ref Range   ABO/RH(D) B POS    Antibody Screen NEG    Sample Expiration 08/22/2018,2359    Unit Number [id number]    Blood Component Type RED CELLS,LR    Unit division 00    Status of Unit ISSUED    Transfusion Status OK TO TRANSFUSE    Crossmatch Result      Compatible Performed at [facility], [place] [address] [place], [place] [address]    Unit Number [id number]    Blood Component Type RED CELLS,LR    Unit division 00    Status of Unit ALLOCATED  Transfusion Status OK TO TRANSFUSE    Crossmatch Result Compatible   Prepare RBC     Status: None   Collection Time: 08/27/2018  5:18 PM  Result Value Ref Range   Order Confirmation      ORDER PROCESSED BY BLOOD BANK Performed at [place] [address] [place], [place] [address]   ABO/Rh     Status: None (Preliminary result)   Collection Time: 08/26/2018  5:18 PM  Result Value Ref Range   ABO/RH(D)      B POS Performed at [facility], [place] [address] [place], [place] [address]   POC occult blood, ED     Status: None   Collection Time: 08/10/2018  5:29 PM  Result Value Ref Range   Fecal Occult Bld NEGATIVE NEGATIVE  CBC     Status: Abnormal   Collection Time: 09/06/2018  7:25 PM  Result Value Ref Range   WBC 12.6 (H) 4.0 - 10.5 K/uL   RBC 2.45 (L) 4.22 - 5.81 MIL/uL   Hemoglobin 5.9 (LL)  13.0 - 17.0 g/dL    Comment: CRITICAL VALUE NOTED.  VALUE IS CONSISTENT WITH PREVIOUSLY REPORTED AND CALLED VALUE.   HCT 19.5 (L) 39.0 - 52.0 %   MCV 79.6 (L) 80.0 - 100.0 fL   MCH 24.1 (L) 26.0 - 34.0 pg   MCHC 30.3 30.0 - 36.0 g/dL   RDW 18.1 (H) 11.5 - 15.5 %   Platelets 158 150 - 400 K/uL   nRBC 0.0 0.0 - 0.2 %    Comment: Performed at [facility], [place] [address] [place], [place] [address]  Protime-INR     Status: Abnormal   Collection Time: 08/29/2018  7:25 PM  Result Value Ref Range   Prothrombin Time 17.3 (H) 11.4 - 15.2 seconds   INR 1.4 (H) 0.8 - 1.2    Comment: (NOTE) INR goal varies based on device and disease states. Performed at [facility], [place] [address], [place], [place] [address]    Ct Abdomen Pelvis W Contrast  Result Date: 08/17/2018 CLINICAL DATA:  Abdominal pain, known malignancy, recurrence neoplasm of the bladder EXAM: CT ABDOMEN AND PELVIS WITH CONTRAST TECHNIQUE: Multidetector CT imaging of the abdomen and pelvis was performed using the standard protocol following bolus administration of intravenous contrast. CONTRAST:  146mL OMNIPAQUE IOHEXOL 300 MG/ML  SOLN COMPARISON:  August 01, 2018 FINDINGS: Lower chest: There is mild cardiomegaly. There is streaky atelectasis or scarring at the right lung base. No hiatal hernia. The visualized portions of the lungs are clear. Hepatobiliary: Multiple peripherally enhancing hypodense liver masses are seen throughout the liver parenchyma they have increased in size and number from the prior exam. For reference in the inferior right liver lobe the mass measured 1.2 cm on series 2, image 72, and now measures 3.1 cm seen on series 2, image 44. The main portal vein is patent. There is a moderate amount of perihepatic ascites. No evidence of calcified gallstones, gallbladder wall thickening or biliary dilatation. Pancreas: Unremarkable. No pancreatic ductal dilatation or surrounding inflammatory  changes. Spleen: Normal in size without focal abnormality. Adrenals/Urinary Tract: Both adrenal glands appear normal. Again noted is a left-sided double-J ureteral stent. There is moderate right hydronephrosis which is slightly decreased from the prior exam. There is bladder wall thickening, predominantly within the right bladder wall and bladder dome which appears unchanged from prior. Stomach/Bowel: The stomach, small bowel, and colon are normal in appearance. No inflammatory  changes, wall thickening, or obstructive findings. Vascular/Lymphatic: There are no enlarged mesenteric, retroperitoneal, or pelvic lymph nodes. Scattered aortic atherosclerotic calcifications are seen without aneurysmal dilatation. Reproductive: The prostate is unremarkable. Other: No evidence of abdominal wall mass or hernia. Musculoskeletal: No acute or significant osseous findings. IMPRESSION: 1. Numerous hypodense liver masses seen throughout which appear larger and more numerous in size from the prior exam August 01, 2018. This is concerning for infectious liver abscesses. 2. New small amount perihepatic and abdominal ascites. 3. Right-sided double-J ureteral stent with mild hydronephrosis 4. Bladder wall thickening/soft tissue nodule as on the prior exam. Electronically Signed   By: Prudencio Pair M.D.   On: 08/28/2018 18:15   Dg Chest Portable 1 View  Result Date: 08/23/2018 CLINICAL DATA:  RIGHT upper quadrant pain, distension and tenderness, shortness of breath. History of kidney cancer with hepatic metastases. EXAM: PORTABLE CHEST 1 VIEW COMPARISON:  Chest x-rays dated 08/01/2018 and 07/28/2017. FINDINGS: Heart size is within normal limits. Again noted is the RIGHT upper lobe pulmonary mass, not appreciably changed compared to most recent chest x-ray of 08/01/2018, better demonstrated on chest CT of 08/01/2018. Least mild mediastinal prominence corresponding to the mediastinal and hilar lymph node metastases described on earlier  chest CT. No new lung findings. No pleural effusion or pneumothorax seen. Osseous structures about the chest are unremarkable. IMPRESSION: 1. No acute findings.  No evidence of pneumonia or pulmonary edema. 2. Grossly stable appearance of patient's previously demonstrated RIGHT upper lobe pulmonary mass. The mediastinal and hilar prominence is compatible with the previously demonstrated lymph node metastases. Electronically Signed   By: Franki Cabot M.D.   On: 08/29/2018 15:59    Pending Labs Unresulted Labs (From admission, onward)    Start     Ordered   09/05/2018 1937  SARS CORONAVIRUS 2 Nasal Swab Aptima Multi Swab  (Asymptomatic/Tier 2 Patients Labs)  Once,   STAT    Question Answer Comment  Is this test for diagnosis or screening Screening   Symptomatic for COVID-19 as defined by CDC No   Hospitalized for COVID-19 No   Admitted to ICU for COVID-19 No   Previously tested for COVID-19 Yes   Resident in a congregate (group) care setting No   Employed in healthcare setting No      09/05/2018 1937   08/29/2018 1453  Urine Culture  Once,   STAT     08/14/2018 1453   08/10/2018 1453  Blood culture (routine x 2)  BLOOD CULTURE X 2,   STAT     09/02/2018 1453   Signed and Held  CBC  (enoxaparin (LOVENOX)    CrCl >/= 30 ml/min)  Once,   R    Comments: Baseline for enoxaparin therapy IF NOT ALREADY DRAWN.  Notify MD if PLT < 100 K.    Signed and Held   Signed and Held  Creatinine, serum  (enoxaparin (LOVENOX)    CrCl >/= 30 ml/min)  Once,   R    Comments: Baseline for enoxaparin therapy IF NOT ALREADY DRAWN.    Signed and Held   Signed and Held  Creatinine, serum  (enoxaparin (LOVENOX)    CrCl >/= 30 ml/min)  Weekly,   R    Comments: while on enoxaparin therapy    Signed and Held   Signed and Held  Magnesium  Once,   R     Signed and Held   Signed and Held  Phosphorus  Once,   R  Signed and Held   Signed and Held  Urine culture  Once,   R     Signed and Held   Signed and [employer],  sputum-assessment  Once,   R     Signed and Held   Signed and Held  Culture, blood (routine x 2)  BLOOD CULTURE X 2,   R     Signed and Held   Signed and Held  Comprehensive metabolic panel  Tomorrow morning,   R     Signed and Held   Signed and Held  CBC  Tomorrow morning,   R     Signed and Held   Signed and Held  Bilirubin, direct  Once,   R     Signed and Held          Vitals/Pain Today's Vitals   08/31/2018 1949 08/09/2018 1949 08/22/2018 2014 08/31/2018 2057  BP: (!) 165/73 (!) 165/73 (!) 167/72 (!) 166/72  Pulse: (!) 102 (!) 102 99 (!) 103  Resp: (!) 22 (!) 22 20 20   Temp: (!) 101.7 F (38.7 C) (!) 101.7 F (38.7 C) (!) 102.4 F (39.1 C) (!) 102 F (38.9 C)  TempSrc: Oral Oral Oral Oral  SpO2:  93% 97% 98%    Isolation Precautions No active isolations  Medications Medications  sodium chloride 0.9 % bolus 1,000 mL (0 mLs Intravenous Stopped 09/01/2018 2028)    And  0.9 %  sodium chloride infusion ( Intravenous New Bag/Given 08/21/2018 1712)  0.9 %  sodium chloride infusion (0 mL/hr Intravenous Hold 08/11/2018 2005)  ceFEPIme (MAXIPIME) 2 g in sodium chloride 0.9 % 100 mL IVPB (has no administration in time range)  furosemide (LASIX) injection 40 mg (has no administration in time range)  ondansetron (ZOFRAN) injection 4 mg (4 mg Intravenous Given 08/23/2018 1518)  HYDROmorphone (DILAUDID) injection 1 mg (1 mg Intravenous Given 08/29/2018 1515)  cefTRIAXone (ROCEPHIN) 1 g in sodium chloride 0.9 % 100 mL IVPB (0 g Intravenous Stopped 08/27/2018 1745)  HYDROmorphone (DILAUDID) injection 1 mg (1 mg Intravenous Given 09/05/2018 1859)  iohexol (OMNIPAQUE) 300 MG/ML solution 100 mL (100 mLs Intravenous Contrast Given 08/10/2018 1727)  acetaminophen (TYLENOL) tablet 650 mg (650 mg Oral Given 08/18/2018 2029)    Mobility walks

## 2018-08-19 NOTE — ED Provider Notes (Signed)
[facility] DEPT Provider Note   CSN: [id number] Arrival date & time: 08/28/2018  1325  Translator used during the visit  History   Chief Complaint Chief Complaint  Patient presents with   Abdominal Pain    HPI [name] is a 73 y.o. male.     HPI Patient presents to the emergency room for evaluation of abdominal pain.  Patient was recently admitted to the hospital on July 24.  He was discharged on July 29.  At that time the patient was admitted to the hospital for gram-negative UTI sepsis.  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  Patient was noted to be febrile during his hospital stay at that time and they felt some of his fever may be due to tumor burden.  Patient had a biopsy of the liver on July 27.  Patient was discharged with a prescription for oxycodone.  Patient states he ran out of that prescription several days ago.  His pain in his abdomen has increased since then.  He continues to have pain in his upper abdomen.  He gets nauseated but has not had any vomiting.  He continues to have low-grade fevers.  He denies any diarrhea or dysuria.  According to the electronic medical record, the patient was supposed to have outpatient follow-up visits with oncology.  The records indicate that he was not available for either visit. Past Medical History:  Diagnosis Date   Allergic rhinitis    Aortic atherosclerosis ([place]) 08/03/2016   Noted on CXR   CAD S/P BMS PCI (mid RCA, prox RCA & RPAV)    cardiologist-  Dr. [name]: a). NSTEMI/[place] 06/2009: Mod CAD & (-) Myoview; b). 02/2012: Inf STEMI s/p Veriflex BMS PCI (3.5 x 12 - 3.75 mm) -mid RCA. c). Exertional Angina / + MYOVIEW -->  BMS PCI x 2- prox RCA (Veriflex BMS 4.0 x 12), rPAV (3.0 x 12);; d) LAST CATH 12/2014: 40% ost LAD, 80% ostial D2, 50% midCx.  Patent RCA - RPAV/PL stents.  Medical Rx   Centriacinar  emphysema [facility])    Chest pain x 1 in last 6 months   COPD, group C, by GOLD 2017 classification [facility]) pulmonologist-- dr mcquid   former smoker--- severe OSA w/ hx multiple hospital admissions w/ exacerbation's   Dysuria    Emphysema lung ([place]) 08/03/2016   Noted on CXR   Essential hypertension    Full dentures    GERD (gastroesophageal reflux disease)    Grade I diastolic dysfunction 23/76/2831   noted on ECHO   Hematuria 04/02/2018   History of cardiomegaly    Noted on CXR 07/28/2017   History of non-ST elevation myocardial infarction (NSTEMI) 06/19/2009   in setting COPD w/ exacerbation;  negative myoview for ischemia;  previously had cardiac cath 06-08-2009 w/ diffuse nonobtructive CAD and  normal LVF   History of prostatitis    History of ST elevation myocardial infarction (STEMI) 02/15/2012   in setting of COPD w/ exacerbation; per cardiac cath PCI and stenting to RCA   Hyperlipidemia    Lower abdominal pain    LVH (left ventricular hypertrophy) 07/27/2017   Moderate, noted on ECHO   Pneumonia 07/28/2017   Noted on CXR    Pulmonary nodules 03/05/2018   Stable 4 mm left upper lobe nodule   Recurrent malignant neoplasm of bladder [facility]) urologist-  dr [name]  previously dx 08/ 2018 and 10/ 2018 w/ high grade urothelial carcinoma with no muscle involvement   Type 2 diabetes mellitus ([place])    Wears glasses     Patient Active Problem List   Diagnosis Date Noted   Acute cystitis with hematuria    Metastatic disease ([place])    Sepsis due to gram-negative UTI ([place]) 08/01/2018   Liver mass, right lobe 08/01/2018   Bladder cancer ([place]) 06/06/2018   Pulmonary nodules 06/04/2018   COPD GOLD II  06/03/2018   Preop cardiovascular exam 04/27/2018   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 ([place]) 03/15/2017   Hematuria 08/03/2016   Dysuria 03/01/2015   Type 2 diabetes mellitus with circulatory disorder (HCC)    PVC's (premature ventricular  contractions)    CAD S/P BMS PCI - pRCA, mRCA, rPAV 06/30/2012   History of ST elevation myocardial infarction (STEMI) of inferior wall ([place]) 02/17/2012   Hyperlipidemia associated with type 2 diabetes mellitus ([place]) 02/17/2012   Essential hypertension 02/17/2012   Tobacco abuse, has stopped 02/17/2012   Allergic rhinitis 02/17/2012   COPD with exacerbation ([place]) 05/31/2009   OBSTRUCTIVE CHRONIC BRONCHITIS 11/13/2006   GERD 11/13/2006    Past Surgical History:  Procedure Laterality Date   CARDIAC CATHETERIZATION N/A 12/27/2014   Procedure: Left Heart Cath and Coronary Angiography;  Surgeon: Jettie Booze, MD;  Location: [place] CV LAB;  Service: Cardiovascular;  Laterality: N/A; Ost-prox LAD 40%, ost D2 (small) 80%, mCFx 50%, mild to moderate left sided disease, patent RCA (p, m & RPAV) stents, normal LVF   COLONOSCOPY     CYSTOSCOPY W/ RETROGRADES Bilateral 09/07/2016   Procedure: CYSTOSCOPY WITH BILATERAL RETROGRADE PYELOGRAM;  Surgeon: [name], MD;  Location: [facility];  Service: Urology;  Laterality: Bilateral;   CYSTOSCOPY W/ RETROGRADES Bilateral 03/15/2017   Procedure: CYSTOSCOPY WITH RETROGRADE PYELOGRAM;  Surgeon: [name], MD;  Location: [facility];  Service: Urology;  Laterality: Bilateral;   CYSTOSCOPY WITH RETROGRADE PYELOGRAM, URETEROSCOPY AND STENT PLACEMENT Left 10/19/2016   Procedure: CYSTOSCOPY WITH RETROGRADE PYELOGRAM, URETEROSCOPY AND STENT PLACEMENT;  Surgeon: [name], MD;  Location: [facility];  Service: Urology;  Laterality: Left;   CYSTOSCOPY WITH URETEROSCOPY Left 06/06/2018   Procedure: CYSTOSCOPY WITH LEFT RETROGRADE PYELOGRAM;  Surgeon: [name], MD;  [facility];  Service: Urology;  Laterality: Left;   ESOPHAGOGASTRODUODENOSCOPY  2004   IR NEPHROSTOGRAM RIGHT THRU EXISTING ACCESS  07/14/2018   IR NEPHROSTOMY EXCHANGE RIGHT  07/07/2018   IR NEPHROSTOMY PLACEMENT RIGHT  06/06/2018     IR URETERAL STENT PLACEMENT EXISTING ACCESS RIGHT  07/07/2018   LEFT HEART CATH AND CORONARY ANGIOGRAPHY  06/08/2009   Dr. Burt Knack: LAD 40-50%, LCX 40%, RCA 25% EF 65%m -thought to be related to coronary spasm.   LEFT HEART CATHETERIZATION WITH CORONARY ANGIOGRAM N/A 02/15/2012   Procedure: LEFT HEART CATHETERIZATION WITH CORONARY ANGIOGRAM;  Surgeon: Peter M [name], MD;  Location: [facility] CATH LAB;  Service: Cardiovascular; inferior STEMI:  mid RCA 99% (BMS PCI), normal LVF, diffuse 20-30% distal RCA and 50% RPL/PAB.  Mid LCx 50-60%.  Mid LAD 30-40% at D1 with ostial D1 50%   LEFT HEART CATHETERIZATION WITH CORONARY ANGIOGRAM N/A 07/01/2012   Procedure: LEFT HEART CATHETERIZATION WITH CORONARY ANGIOGRAM;  Surgeon: Peter M [name], MD;  Location: [facility] CATH LAB;  Service: Cardiovascular:: CLASS III ANGINA & + MYOVIEW (inf Ischemia) -- pRCA RCA 90% (BMS PCI), widely patent mid RCA BMS & diffuse 30-40% dist  RCA, RPAV focal 80% (BMS PCI).  30% P-M LAD.  20% circumflex marginal.  EF 55-65%.   LEFT HEART CATHETERIZATION WITH CORONARY ANGIOGRAM N/A 08/03/2013   Procedure: LEFT HEART CATHETERIZATION WITH CORONARY ANGIOGRAM;  Surgeon: Peter M [name], MD;  Location: [facility] C[facility] LAB;  Service: Cardiovascular;  Laterality: N/A;  nonobstuctive CAD, --  Mild LAD (mid 40%).  Mild circumflex.  Patent mid RCA and PAB stents with mild diffuse 30% disease elsewhere (~30% RPAV ISR).  Normal LV function.[name]    NM MYOVIEW LTD  06/25/2012   Dr. [name]: INTERMEDIATE RISK - w/ medium-sized, mild primary reversible basal to mid inferior perfusion defect suggestive of ischemia/  normal LV function and wall motion ,ef 63% --> lead to CATH & BMS PCI of  pRCA & RPAV.   PERCUTANEOUS CORONARY STENT INTERVENTION (PCI-S) N/A 02/15/2012   Procedure: PERCUTANEOUS CORONARY STENT INTERVENTION (PCI-S);  Surgeon: Peter M [name], MD;  Location: [facility] CATH LAB;  Service: Cardiovascular:: mRCA 99% -- Veriflex BMS 3.0 x 12 (3.75 mm) -insetting of inferior STEMI    PERCUTANEOUS CORONARY STENT INTERVENTION (PCI-S)  07/01/2012   Procedure: PERCUTANEOUS CORONARY STENT INTERVENTION (PCI-S);  Surgeon: Peter M [name], MD;  Location: [facility] CATH LAB;  Service: Cardiovascular;;  pRCA PCI - Veriflex BMS 4.0 x 12; RPAV PCI - Veriflex BMS 3.0 x 12    stents to heart x 2     TRANSTHORACIC ECHOCARDIOGRAM  07/2017   Mod LVH, EF 50-55%, Gr 1 DD. mild PR and TR, trivial pericardial effusion (no change from 07/2016)   TRANSURETHRAL RESECTION OF BLADDER TUMOR N/A 09/07/2016   Procedure: TRANSURETHRAL RESECTION OF BLADDER TUMOR (TURBT);  Surgeon: [name], MD;  [facility];  Service: Urology;  Laterality: N/A;   TRANSURETHRAL RESECTION OF BLADDER TUMOR N/A 10/19/2016   Procedure: TRANSURETHRAL RESECTION OF BLADDER TUMOR (TURBT);  Surgeon: [name], MD;  [facility];  Service: Urology;  Laterality: N/A;   TRANSURETHRAL RESECTION OF BLADDER TUMOR N/A 03/15/2017   Procedure: TRANSURETHRAL RESECTION OF BLADDER TUMOR (TURBT);  Surgeon: [name], MD;  Location: [facility];  Service: Urology;  Laterality: N/A;   TRANSURETHRAL RESECTION OF BLADDER TUMOR N/A 06/06/2018   Procedure: TRANSURETHRAL RESECTION OF BLADDER TUMOR (TURBT);  Surgeon: [name], MD;  [facility];  Service: Urology;  Laterality: N/A;        Home Medications    Prior to Admission medications   Medication Sig Start Date End Date Taking? Authorizing Provider  acetaminophen (TYLENOL) 325 MG tablet Take 2 tablets (650 mg total) by mouth every 6 (six) hours as needed for mild pain (or Fever >/= 101). 08/06/18  Yes Eugenie Filler, MD  albuterol (PROVENTIL HFA) 108 (90 BASE) MCG/ACT inhaler Inhale 2 puffs into the lungs 2 (two) times daily. Patient taking differently: Inhale 2 puffs into the lungs every 6 (six) hours as needed for wheezing or shortness of breath.  05/06/14  Yes Robbie Lis, MD  albuterol (PROVENTIL) (2.5 MG/3ML) 0.083%  nebulizer solution Take 2.5 mg by nebulization every 6 (six) hours as needed for wheezing or shortness of breath.   Yes [provider]  amLODipine (NORVASC) 10 MG tablet Take 10 mg by mouth daily.  04/07/17  Yes [provider]  aspirin EC 81 MG tablet Take 81 mg by mouth daily.   Yes [provider]  carvedilol (COREG) 3.125 MG tablet Take 1 tablet (3.125 mg total) by mouth 2 (two) times daily. 08/06/18  Yes Eugenie Filler, MD  folic acid (FOLVITE) 1 MG tablet Take 1 mg by mouth daily. 04/20/18  Yes [provider]  ibuprofen (ADVIL) 200 MG tablet Take 400 mg by mouth 2 (two) times a day.   Yes [provider]  latanoprost (XALATAN) 0.005 % ophthalmic solution Place 1 drop into both eyes at bedtime. 07/14/18  Yes [provider]  meloxicam (MOBIC) 15 MG tablet Take 15 mg by mouth daily. 08/07/18  Yes [provider]  metFORMIN (GLUCOPHAGE) 500 MG tablet Take 500 mg by mouth 2 (two) times daily with a meal.    Yes [provider]  nitroGLYCERIN (NITROSTAT) 0.4 MG SL tablet Place 1 tablet (0.4 mg total) under the tongue every 5 (five) minutes as needed for chest pain. 01/05/15  Yes [name], PA  pantoprazole (PROTONIX) 40 MG tablet Take 1 tablet (40 mg total) by mouth daily at 6 (six) AM. 08/07/18  Yes Eugenie Filler, MD  potassium chloride SA (K-DUR) 20 MEQ tablet Take 20 mEq by mouth daily.   Yes [provider]  rosuvastatin (CRESTOR) 40 MG tablet Take 40 mg by mouth daily. 11/29/17  Yes [provider]  senna-docusate (SENOKOT-S) 8.6-50 MG tablet Take 1 tablet by mouth 2 (two) times daily. 08/06/18  Yes Eugenie Filler, MD  SYMBICORT 160-4.5 MCG/ACT inhaler Inhale 2 puffs into the lungs 2 (two) times a day.  05/29/17  Yes [provider]  tamsulosin (FLOMAX) 0.4 MG CAPS capsule Take 2 capsules (0.8 mg total) by mouth every evening. Patient taking differently: Take 0.4 mg by mouth daily after  breakfast.  08/08/16  Yes Waldron Session, MD  Tiotropium Bromide Monohydrate (SPIRIVA RESPIMAT) 2.5 MCG/ACT AERS 2 pffs each am Patient taking differently: Take 2 puffs by mouth every morning. 2 pffs each am 06/03/18  Yes Tanda Rockers, MD  Vitamin D, Ergocalciferol, (DRISDOL) 50000 units CAPS capsule Take 50,000 Units by mouth once a week.  03/28/17  Yes [provider]  budesonide (PULMICORT) 0.5 MG/2ML nebulizer solution Take 2 mLs (0.5 mg total) by nebulization 2 (two) times daily. DX:  496 02/07/11 03/21/11  Parrett, Fonnie Mu, NP  esomeprazole (NEXIUM) 40 MG capsule Take 1 capsule (40 mg total) by mouth daily. 12/28/10 03/21/11  Elsie Stain, MD    Family History Family History  Problem Relation Age of Onset   CAD Neg Hx        no known FHx of early CAD    Social History Social History   Tobacco Use   Smoking status: Former Smoker    Packs/day: 1.00    Years: 50.00    Pack years: 50.00    Types: Cigarettes    Quit date: 01/09/2008    Years since quitting: 10.6   Smokeless tobacco: Never Used   Tobacco comment: rolled own cigarettes since age of 59,   Substance Use Topics   Alcohol use: Not Currently    Comment: occ   Drug use: No     Allergies   Patient has no known allergies.   Review of Systems Review of Systems  All other systems reviewed and are negative.    Physical Exam Updated Vital Signs BP (!) 165/77    Pulse (!) 102    Temp 98.4 F (36.9 C) (Oral)    Resp (!) 21    SpO2 97%   Physical Exam Vitals signs and nursing note reviewed.  Constitutional:      Appearance: He is  well-developed. He is ill-appearing.  HENT:     Head: Normocephalic and atraumatic.     Right Ear: External ear normal.     Left Ear: External ear normal.  Eyes:     General: No scleral icterus.       Right eye: No discharge.        Left eye: No discharge.     Conjunctiva/sclera: Conjunctivae normal.  Neck:     Musculoskeletal: Neck supple.     Trachea: No tracheal  deviation.  Cardiovascular:     Rate and Rhythm: Normal rate and regular rhythm.  Pulmonary:     Effort: Pulmonary effort is normal. No respiratory distress.     Breath sounds: Normal breath sounds. No stridor. No wheezing or rales.  Abdominal:     General: Bowel sounds are normal. There is no distension.     Palpations: Abdomen is soft.     Tenderness: There is abdominal tenderness in the right upper quadrant and epigastric area. There is no guarding or rebound.  Musculoskeletal:        General: No tenderness.  Skin:    General: Skin is warm and dry.     Findings: No rash.  Neurological:     Mental Status: He is alert.     Cranial Nerves: No cranial nerve deficit (no facial droop, extraocular movements intact, no slurred speech).     Sensory: No sensory deficit.     Motor: No abnormal muscle tone or seizure activity.     Coordination: Coordination normal.      ED Treatments / Results  Labs (all labs ordered are listed, but only abnormal results are displayed) Labs Reviewed  COMPREHENSIVE METABOLIC PANEL - Abnormal; Notable for the following components:      Result Value   Potassium 3.4 (*)    Glucose, Bld 170 (*)    BUN 24 (*)    Creatinine, Ser 1.28 (*)    Calcium 7.6 (*)    Total Protein 5.6 (*)    Albumin 2.1 (*)    AST 129 (*)    ALT 67 (*)    Alkaline Phosphatase 642 (*)    Total Bilirubin 2.5 (*)    GFR calc non Af Amer 55 (*)    All other components within normal limits  CBC WITH DIFFERENTIAL/PLATELET - Abnormal; Notable for the following components:   WBC 11.5 (*)    RBC 2.48 (*)    Hemoglobin 6.0 (*)    HCT 19.4 (*)    MCV 78.2 (*)    MCH 24.2 (*)    RDW 18.0 (*)    Neutro Abs 9.7 (*)    Lymphs Abs 0.6 (*)    Monocytes Absolute 1.1 (*)    Abs Immature Granulocytes 0.15 (*)    All other components within normal limits  URINALYSIS, ROUTINE W REFLEX MICROSCOPIC - Abnormal; Notable for the following components:   Color, Urine AMBER (*)    APPearance  HAZY (*)    Hgb urine dipstick LARGE (*)    Protein, ur 30 (*)    Leukocytes,Ua LARGE (*)    RBC / HPF >50 (*)    WBC, UA >50 (*)    Bacteria, UA RARE (*)    All other components within normal limits  URINE CULTURE  CULTURE, BLOOD (ROUTINE X 2)  CULTURE, BLOOD (ROUTINE X 2)  LIPASE, BLOOD  LACTIC ACID, PLASMA  CBC  POC OCCULT BLOOD, ED  TYPE AND SCREEN  PREPARE RBC (CROSSMATCH)  ABO/RH    EKG None  Radiology Ct Abdomen Pelvis W Contrast  Result Date: 08/22/2018 CLINICAL DATA:  Abdominal pain, known malignancy, recurrence neoplasm of the bladder EXAM: CT ABDOMEN AND PELVIS WITH CONTRAST TECHNIQUE: Multidetector CT imaging of the abdomen and pelvis was performed using the standard protocol following bolus administration of intravenous contrast. CONTRAST:  125mL OMNIPAQUE IOHEXOL 300 MG/ML  SOLN COMPARISON:  August 01, 2018 FINDINGS: Lower chest: There is mild cardiomegaly. There is streaky atelectasis or scarring at the right lung base. No hiatal hernia. The visualized portions of the lungs are clear. Hepatobiliary: Multiple peripherally enhancing hypodense liver masses are seen throughout the liver parenchyma they have increased in size and number from the prior exam. For reference in the inferior right liver lobe the mass measured 1.2 cm on series 2, image 72, and now measures 3.1 cm seen on series 2, image 44. The main portal vein is patent. There is a moderate amount of perihepatic ascites. No evidence of calcified gallstones, gallbladder wall thickening or biliary dilatation. Pancreas: Unremarkable. No pancreatic ductal dilatation or surrounding inflammatory changes. Spleen: Normal in size without focal abnormality. Adrenals/Urinary Tract: Both adrenal glands appear normal. Again noted is a left-sided double-J ureteral stent. There is moderate right hydronephrosis which is slightly decreased from the prior exam. There is bladder wall thickening, predominantly within the right bladder wall  and bladder dome which appears unchanged from prior. Stomach/Bowel: The stomach, small bowel, and colon are normal in appearance. No inflammatory changes, wall thickening, or obstructive findings. Vascular/Lymphatic: There are no enlarged mesenteric, retroperitoneal, or pelvic lymph nodes. Scattered aortic atherosclerotic calcifications are seen without aneurysmal dilatation. Reproductive: The prostate is unremarkable. Other: No evidence of abdominal wall mass or hernia. Musculoskeletal: No acute or significant osseous findings. IMPRESSION: 1. Numerous hypodense liver masses seen throughout which appear larger and more numerous in size from the prior exam August 01, 2018. This is concerning for infectious liver abscesses. 2. New small amount perihepatic and abdominal ascites. 3. Right-sided double-J ureteral stent with mild hydronephrosis 4. Bladder wall thickening/soft tissue nodule as on the prior exam. Electronically Signed   By: Prudencio Pair M.D.   On: 09/01/2018 18:15   Dg Chest Portable 1 View  Result Date: 09/06/2018 CLINICAL DATA:  RIGHT upper quadrant pain, distension and tenderness, shortness of breath. History of kidney cancer with hepatic metastases. EXAM: PORTABLE CHEST 1 VIEW COMPARISON:  Chest x-rays dated 08/01/2018 and 07/28/2017. FINDINGS: Heart size is within normal limits. Again noted is the RIGHT upper lobe pulmonary mass, not appreciably changed compared to most recent chest x-ray of 08/01/2018, better demonstrated on chest CT of 08/01/2018. Least mild mediastinal prominence corresponding to the mediastinal and hilar lymph node metastases described on earlier chest CT. No new lung findings. No pleural effusion or pneumothorax seen. Osseous structures about the chest are unremarkable. IMPRESSION: 1. No acute findings.  No evidence of pneumonia or pulmonary edema. 2. Grossly stable appearance of patient's previously demonstrated RIGHT upper lobe pulmonary mass. The mediastinal and hilar  prominence is compatible with the previously demonstrated lymph node metastases. Electronically Signed   By: Franki Cabot M.D.   On: 08/28/2018 15:59    Procedures .Critical Care Performed by: Dorie Rank, MD Authorized by: Dorie Rank, MD   Critical care provider statement:    Critical care time (minutes):  45   Critical care was time spent personally by me on the following activities:  Discussions with consultants, evaluation of patient's response to treatment, examination of patient,  ordering and performing treatments and interventions, ordering and review of laboratory studies, ordering and review of radiographic studies, pulse oximetry, re-evaluation of patient's condition, obtaining history from patient or surrogate and review of old charts   (including critical care time)  Medications Ordered in ED Medications  sodium chloride 0.9 % bolus 1,000 mL (1,000 mLs Intravenous New Bag/Given 08/18/2018 1520)    And  0.9 %  sodium chloride infusion ( Intravenous New Bag/Given 09/06/2018 1712)  0.9 %  sodium chloride infusion (has no administration in time range)  ondansetron (ZOFRAN) injection 4 mg (4 mg Intravenous Given 08/22/2018 1518)  HYDROmorphone (DILAUDID) injection 1 mg (1 mg Intravenous Given 08/21/2018 1515)  cefTRIAXone (ROCEPHIN) 1 g in sodium chloride 0.9 % 100 mL IVPB (0 g Intravenous Stopped 08/17/2018 1745)  HYDROmorphone (DILAUDID) injection 1 mg (1 mg Intravenous Given 08/21/2018 1859)  iohexol (OMNIPAQUE) 300 MG/ML solution 100 mL (100 mLs Intravenous Contrast Given 09/05/2018 1727)     Initial Impression / Assessment and Plan / ED Course  I have reviewed the triage vital signs and the nursing notes.  Pertinent labs & imaging results that were available during my care of the patient were reviewed by me and considered in my medical decision making (see chart for details).  Clinical Course as of Aug 19 1922  Tue Aug 19, 2018  1642 Significant drop in hemoglobin.  Urinalysis suggests  urinary tract infection.  Persistent elevation in LFTs with worsening bilirubin.   [JK]  1923 Hemoccult test is negative.  CT scan shows worsening liver lesions.  Suggestive of abscess.  However patient did recently have an ultrasound biopsy and there was no evidence of abscess   [JK]    Clinical Course User Index [JK] Dorie Rank, MD     Patient presented to the emergency room for evaluation of worsening abdominal pain in the setting of known metastatic cancer.  Patient's labs are notable for acute anemia.  No signs of active bleeding.  CT scan does not show evidence of intra-abdominal hemorrhage.  Unclear what the etiology of this is.  Plan on repeat lab testing.  Urinalysis does suggest UTI.  CT scan does suggest hepatic abscess however he did have a recent biopsy.  I suspect this is metastatic lesions.  I will consult the medical service for admission and anticipate oncology will consult in the morning.  Final Clinical Impressions(s) / ED Diagnoses   Final diagnoses:  Metastatic cancer ([place])  Acute cystitis without hematuria  Anemia, unspecified type      Dorie Rank, MD 08/31/2018 1926

## 2018-08-19 NOTE — ED Notes (Signed)
Placed on O2 2 L [place]

## 2018-08-19 NOTE — ED Triage Notes (Signed)
Recently the patient was about to have a stent placed due to kidney cancer but since cancer has moved to the liver stent is being reconsidered. A little over 1 hour ago the patient noticed 9/10 right upper quadrant pain. EMS also noted distention and tenderness. He has had a decrease in urine output. EMS administered 100 mcg of Fentanyl.    EMS vitals: 108 HR 260 CBG 97% O2 sat on room air 157/90 BP 98 Temp

## 2018-08-19 NOTE — ED Notes (Signed)
Interpretor # [id number] assisted with assessment.

## 2018-08-20 ENCOUNTER — Inpatient Hospital Stay (HOSPITAL_COMMUNITY): Payer: Medicare HMO

## 2018-08-20 ENCOUNTER — Telehealth: Payer: Self-pay | Admitting: Oncology

## 2018-08-20 DIAGNOSIS — R609 Edema, unspecified: Secondary | ICD-10-CM

## 2018-08-20 DIAGNOSIS — D649 Anemia, unspecified: Secondary | ICD-10-CM

## 2018-08-20 DIAGNOSIS — D72829 Elevated white blood cell count, unspecified: Secondary | ICD-10-CM

## 2018-08-20 DIAGNOSIS — N3000 Acute cystitis without hematuria: Secondary | ICD-10-CM

## 2018-08-20 DIAGNOSIS — Z7189 Other specified counseling: Secondary | ICD-10-CM

## 2018-08-20 DIAGNOSIS — R0603 Acute respiratory distress: Secondary | ICD-10-CM

## 2018-08-20 DIAGNOSIS — C787 Secondary malignant neoplasm of liver and intrahepatic bile duct: Secondary | ICD-10-CM

## 2018-08-20 DIAGNOSIS — R52 Pain, unspecified: Secondary | ICD-10-CM

## 2018-08-20 DIAGNOSIS — R7989 Other specified abnormal findings of blood chemistry: Secondary | ICD-10-CM

## 2018-08-20 DIAGNOSIS — C672 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: Secondary | ICD-10-CM

## 2018-08-20 DIAGNOSIS — K750 Abscess of liver: Secondary | ICD-10-CM

## 2018-08-20 LAB — TYPE AND SCREEN
ABO/RH(D): B POS
Antibody Screen: NEGATIVE
Unit division: 0
Unit division: 0

## 2018-08-20 LAB — EXPECTORATED SPUTUM ASSESSMENT W GRAM STAIN, RFLX TO RESP C

## 2018-08-20 LAB — BILIRUBIN, DIRECT: Bilirubin, Direct: 2.4 mg/dL — ABNORMAL HIGH (ref 0.0–0.2)

## 2018-08-20 LAB — COMPREHENSIVE METABOLIC PANEL
ALT: 71 U/L — ABNORMAL HIGH (ref 0–44)
AST: 148 U/L — ABNORMAL HIGH (ref 15–41)
Albumin: 2.1 g/dL — ABNORMAL LOW (ref 3.5–5.0)
Alkaline Phosphatase: 586 U/L — ABNORMAL HIGH (ref 38–126)
Anion gap: 7 (ref 5–15)
BUN: 21 mg/dL (ref 8–23)
CO2: 23 mmol/L (ref 22–32)
Calcium: 7.3 mg/dL — ABNORMAL LOW (ref 8.9–10.3)
Chloride: 106 mmol/L (ref 98–111)
Creatinine, Ser: 1.21 mg/dL (ref 0.61–1.24)
GFR calc Af Amer: >60 mL/min (ref >60)
GFR calc non Af Amer: 59 mL/min — ABNORMAL LOW (ref >60)
Glucose, Bld: 112 mg/dL — ABNORMAL HIGH (ref 70–99)
Potassium: 3.9 mmol/L (ref 3.5–5.1)
Sodium: 136 mmol/L (ref 135–145)
Total Bilirubin: 3.7 mg/dL — ABNORMAL HIGH (ref 0.3–1.2)
Total Protein: 5.5 g/dL — ABNORMAL LOW (ref 6.5–8.1)

## 2018-08-20 LAB — MAGNESIUM: Magnesium: 1.7 mg/dL (ref 1.7–2.4)

## 2018-08-20 LAB — CBC
HCT: 28.8 % — ABNORMAL LOW (ref 39.0–52.0)
Hemoglobin: 9.2 g/dL — ABNORMAL LOW (ref 13.0–17.0)
MCH: 26.7 pg (ref 26.0–34.0)
MCHC: 31.9 g/dL (ref 30.0–36.0)
MCV: 83.5 fL (ref 80.0–100.0)
Platelets: 136 10*3/uL — ABNORMAL LOW (ref 150–400)
RBC: 3.45 MIL/uL — ABNORMAL LOW (ref 4.22–5.81)
RDW: 17.2 % — ABNORMAL HIGH (ref 11.5–15.5)
WBC: 14.3 10*3/uL — ABNORMAL HIGH (ref 4.0–10.5)
nRBC: 0.2 % (ref 0.0–0.2)

## 2018-08-20 LAB — BPAM RBC
Blood Product Expiration Date: 202009052359
Blood Product Expiration Date: 202009052359
ISSUE DATE / TIME: 202008111940
ISSUE DATE / TIME: 202008112343
Unit Type and Rh: 7300
Unit Type and Rh: 7300

## 2018-08-20 LAB — ABO/RH: ABO/RH(D): B POS

## 2018-08-20 LAB — GLUCOSE, CAPILLARY
Glucose-Capillary: 97 mg/dL (ref 70–99)
Glucose-Capillary: 91 mg/dL (ref 70–99)
Glucose-Capillary: 111 mg/dL — ABNORMAL HIGH (ref 70–99)
Glucose-Capillary: 129 mg/dL — ABNORMAL HIGH (ref 70–99)

## 2018-08-20 LAB — MRSA PCR SCREENING: MRSA by PCR: NEGATIVE

## 2018-08-20 LAB — URINE CULTURE: Culture: <10000 — AB

## 2018-08-20 LAB — AMMONIA: Ammonia: 29 umol/L (ref 9–35)

## 2018-08-20 LAB — LACTIC ACID, PLASMA
Lactic Acid, Venous: 1.6 mmol/L (ref 0.5–1.9)
Lactic Acid, Venous: 1.4 mmol/L (ref 0.5–1.9)

## 2018-08-20 LAB — PHOSPHORUS: Phosphorus: 3.2 mg/dL (ref 2.5–4.6)

## 2018-08-20 LAB — SARS CORONAVIRUS 2 (TAT 6-24 HRS): SARS Coronavirus 2: NEGATIVE

## 2018-08-20 MED ORDER — HYDRALAZINE HCL 20 MG/ML IJ SOLN
5.0000 mg | Freq: Once | INTRAMUSCULAR | Status: AC
  Administered 2018-08-20: 08:00:00 5 mg via INTRAVENOUS
  Filled 2018-08-20: qty 1

## 2018-08-20 MED ORDER — MORPHINE SULFATE (PF) 2 MG/ML IV SOLN
1.0000 mg | INTRAVENOUS | Status: DC | PRN
  Administered 2018-08-20 – 2018-08-21 (×5): 2 mg via INTRAVENOUS
  Filled 2018-08-20 (×5): qty 1

## 2018-08-20 MED ORDER — CHLORHEXIDINE GLUCONATE CLOTH 2 % EX PADS
6.0000 | MEDICATED_PAD | Freq: Every day | CUTANEOUS | Status: DC
  Administered 2018-08-20 – 2018-08-24 (×5): 6 via TOPICAL

## 2018-08-20 MED ORDER — ALBUTEROL SULFATE HFA 108 (90 BASE) MCG/ACT IN AERS
2.0000 | INHALATION_SPRAY | RESPIRATORY_TRACT | Status: DC | PRN
  Administered 2018-08-20 – 2018-08-24 (×4): 2 via RESPIRATORY_TRACT

## 2018-08-20 MED ORDER — IPRATROPIUM-ALBUTEROL 0.5-2.5 (3) MG/3ML IN SOLN
3.0000 mL | Freq: Four times a day (QID) | RESPIRATORY_TRACT | Status: DC
  Administered 2018-08-20 – 2018-08-23 (×11): 3 mL via RESPIRATORY_TRACT
  Filled 2018-08-20 (×13): qty 3

## 2018-08-20 MED ORDER — HYDRALAZINE HCL 20 MG/ML IJ SOLN
10.0000 mg | Freq: Once | INTRAMUSCULAR | Status: AC
  Administered 2018-08-20: 06:00:00 10 mg via INTRAVENOUS
  Filled 2018-08-20: qty 1

## 2018-08-20 MED ORDER — MORPHINE SULFATE (PF) 2 MG/ML IV SOLN
2.0000 mg | INTRAVENOUS | Status: DC | PRN
  Administered 2018-08-20: 1 mg via INTRAVENOUS
  Administered 2018-08-20: 2 mg via INTRAVENOUS
  Filled 2018-08-20 (×2): qty 1

## 2018-08-20 MED ORDER — FUROSEMIDE 10 MG/ML IJ SOLN
40.0000 mg | Freq: Once | INTRAMUSCULAR | Status: AC
  Administered 2018-08-20: 08:00:00 40 mg via INTRAVENOUS
  Filled 2018-08-20: qty 4
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
## 2018-08-20 NOTE — Progress Notes (Signed)
Events overnight noted.  Full note to follow.  This is a 73 year old man known to me with bladder cancer was treated with radiation therapy concomitantly with chemotherapy.  He developed hepatic metastasis that are biopsy-proven to be malignant based on previous hospitalization on August 04, 2018.  He presents with a abdominal pain with increased hepatic metastasis are more numerous and also concerning for liver abscesses.  He is experiencing respiratory distress and appears septic.  These hepatic lesions are certainly malignant in nature and they were present during his previous hospitalization but could have hepatic abscesses in addition.  Input from infectious disease would be helpful.  He is overall prognosis from cancer standpoint would be for as his disease would not be curable and given the aggressive nature of his metastasis and limited life expectancy even with treatment.  I would be in favor of palliative medicine involvement to help establish goals of care with the patient and his daughter.

## 2018-08-20 NOTE — Consult Note (Signed)
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  PCCM consulted for acute respiratory distress  SIGNIFICANT EVENTS  7/24 to 7/29 -admission for UTI/sepsis 7/27 liver biopsy -malignancy  STUDIES:  CT abdomen/pelvis 8/11 >> numerous hypodense liver masses, increased in size compared to 7/24.  Right-sided double-J stent with mild hydronephrosis bladder wall thickening and soft tissue nodule as prior   HISTORY OF PRESENT ILLNESS: 73 year old with obese man who was admitted 7/24 to 7/29 for UTI and sepsis.  During this admission he was found to have liver metastases diagnosed on liver biopsy .  He was seen by oncology and urology.  Urine cultures showed Klebsiella and Pseudomonas.  He initially received IV ceftriaxone and was discharged on Cipro He was readmitted 8/12 due to fever 102 and abdominal pain .  Labs showed hemoglobin had dropped to 6 from 10.4 on previous discharge, he was transfused with 2 units PRBC, given IV Rocephin and admitted to stepdown unit he developed acute onset respiratory distress morning of 8/12 requiring nonrebreather.  Hence he PCCM was consulted and he was placed on BiPAP   PAST MEDICAL HISTORY :   has a past medical history of Allergic rhinitis, Aortic atherosclerosis ([place]) (08/03/2016), CAD S/P BMS PCI (mid RCA, prox RCA & RPAV), Centriacinar emphysema ([place]), Chest pain (x 1 in last 6 months), COPD, group C, by GOLD 2017 classification [facility]) (pulmonologist-- dr Waunita Schooner), Dysuria, Emphysema lung ([place]) (08/03/2016), Essential hypertension, Full dentures, GERD (gastroesophageal reflux disease), Grade I diastolic dysfunction (81/82/9937), Hematuria (04/02/2018),  History of cardiomegaly, History of non-ST elevation myocardial infarction (NSTEMI) (06/19/2009), History of prostatitis, History of ST elevation myocardial infarction (STEMI) (02/15/2012), Hyperlipidemia, Lower abdominal pain, LVH (left ventricular hypertrophy) (07/27/2017), Pneumonia (07/28/2017), Pulmonary nodules (03/05/2018), Recurrent malignant neoplasm of bladder ([place]) (urologist-  dr [name]), Type 2 diabetes mellitus ([place]), and Wears glasses.  has a past surgical history that includes left heart catheterization with coronary angiogram (N/A, 02/15/2012); percutaneous coronary stent intervention (pci-s) (N/A, 02/15/2012); left heart catheterization with coronary angiogram (N/A, 07/01/2012); percutaneous coronary stent intervention (pci-s) (07/01/2012); left heart catheterization with coronary angiogram (N/A, 08/03/2013); Transurethral resection of bladder tumor (N/A, 09/07/2016); Cystoscopy w/ retrogrades (Bilateral, 09/07/2016); Transurethral resection of bladder tumor (N/A, 10/19/2016); Cystoscopy with retrograde pyelogram, ureteroscopy and stent placement (Left, 10/19/2016); Cardiac catheterization (N/A, 12/27/2014); transthoracic echocardiogram (07/2017); Transurethral resection of bladder tumor (N/A, 03/15/2017); Cystoscopy w/ retrogrades (Bilateral, 03/15/2017); stents to heart x 2; LEFT HEART CATH AND CORONARY ANGIOGRAPHY (06/08/2009); NM MYOVIEW LTD (06/25/2012); Esophagogastroduodenoscopy (2004); Colonoscopy; Transurethral resection of bladder tumor (N/A, 06/06/2018); Cystoscopy with ureteroscopy (Left, 06/06/2018); IR NEPHROSTOMY PLACEMENT RIGHT (06/06/2018); IR URETERAL STENT PLACEMENT EXISTING ACCESS RIGHT (07/07/2018); IR NEPHROSTOMY EXCHANGE RIGHT (07/07/2018); and IR NEPHROSTOGRAM RIGHT THRU EXISTING ACCESS (07/14/2018). Prior to Admission medications   Medication Sig Start Date End Date Taking? Authorizing Provider  acetaminophen (TYLENOL) 325 MG tablet Take 2 tablets (650 mg total) by mouth every 6 (six)  hours as needed for mild pain (or Fever >/= 101). 08/06/18  Yes Eugenie Filler, MD  albuterol (PROVENTIL HFA) 108 (90 BASE) MCG/ACT inhaler Inhale 2 puffs into the lungs 2 (two) times daily. Patient taking differently: Inhale 2 puffs into the lungs every 6 (six) hours as needed for wheezing  or shortness of breath.  05/06/14  Yes Robbie Lis, MD  albuterol (PROVENTIL) (2.5 MG/3ML) 0.083% nebulizer solution Take 2.5 mg by nebulization every 6 (six) hours as needed for wheezing or shortness of breath.   Yes [provider]  amLODipine (NORVASC) 10 MG tablet Take 10 mg by mouth daily.  04/07/17  Yes [provider]  aspirin EC 81 MG tablet Take 81 mg by mouth daily.   Yes [provider]  carvedilol (COREG) 3.125 MG tablet Take 1 tablet (3.125 mg total) by mouth 2 (two) times daily. 08/06/18  Yes Eugenie Filler, MD  folic acid (FOLVITE) 1 MG tablet Take 1 mg by mouth daily. 04/20/18  Yes [provider]  ibuprofen (ADVIL) 200 MG tablet Take 400 mg by mouth 2 (two) times a day.   Yes [provider]  latanoprost (XALATAN) 0.005 % ophthalmic solution Place 1 drop into both eyes at bedtime. 07/14/18  Yes [provider]  meloxicam (MOBIC) 15 MG tablet Take 15 mg by mouth daily. 08/07/18  Yes [provider]  metFORMIN (GLUCOPHAGE) 500 MG tablet Take 500 mg by mouth 2 (two) times daily with a meal.    Yes [provider]  nitroGLYCERIN (NITROSTAT) 0.4 MG SL tablet Place 1 tablet (0.4 mg total) under the tongue every 5 (five) minutes as needed for chest pain. 01/05/15  Yes [name], PA  pantoprazole (PROTONIX) 40 MG tablet Take 1 tablet (40 mg total) by mouth daily at 6 (six) AM. 08/07/18  Yes Eugenie Filler, MD  potassium chloride SA (K-DUR) 20 MEQ tablet Take 20 mEq by mouth daily.   Yes [provider]  rosuvastatin (CRESTOR) 40 MG tablet Take 40 mg by mouth daily. 11/29/17  Yes [provider]  senna-docusate  (SENOKOT-S) 8.6-50 MG tablet Take 1 tablet by mouth 2 (two) times daily. 08/06/18  Yes Eugenie Filler, MD  SYMBICORT 160-4.5 MCG/ACT inhaler Inhale 2 puffs into the lungs 2 (two) times a day.  05/29/17  Yes [provider]  tamsulosin (FLOMAX) 0.4 MG CAPS capsule Take 2 capsules (0.8 mg total) by mouth every evening. Patient taking differently: Take 0.4 mg by mouth daily after breakfast.  08/08/16  Yes Waldron Session, MD  Tiotropium Bromide Monohydrate (SPIRIVA RESPIMAT) 2.5 MCG/ACT AERS 2 pffs each am Patient taking differently: Take 2 puffs by mouth every morning. 2 pffs each am 06/03/18  Yes Tanda Rockers, MD  Vitamin D, Ergocalciferol, (DRISDOL) 50000 units CAPS capsule Take 50,000 Units by mouth once a week.  03/28/17  Yes [provider]  budesonide (PULMICORT) 0.5 MG/2ML nebulizer solution Take 2 mLs (0.5 mg total) by nebulization 2 (two) times daily. DX:  496 02/07/11 03/21/11  Parrett, Fonnie Mu, NP  esomeprazole (NEXIUM) 40 MG capsule Take 1 capsule (40 mg total) by mouth daily. 12/28/10 03/21/11  Elsie Stain, MD   No Known Allergies  FAMILY HISTORY:  family history is not on file. SOCIAL HISTORY:  reports that he quit smoking about 10 years ago. His smoking use included cigarettes. He has a 50.00 pack-year smoking history. He has never used smokeless tobacco. He reports previous alcohol use. He reports that he does not use drugs.  REVIEW OF SYSTEMS:   Constitutional: Positive for fever, chills, weight loss, malaise/fatigue and diaphoresis.  HENT: Negative for hearing loss, ear pain, nosebleeds, congestion, sore throat, neck pain, tinnitus and ear discharge.   Eyes: Negative for blurred vision, double vision, photophobia, pain, discharge and  redness.  Respiratory: Negative for cough, hemoptysis, sputum production, wheezing and stridor.   Cardiovascular: Negative for chest pain, palpitations, orthopnea, claudication, leg swelling and PND.  Gastrointestinal: Negative  for heartburn, nausea, vomiting, abdominal pain, diarrhea, constipation, blood in stool and melena.  Genitourinary: Positive for dysuria, urgency, frequency, hematuria , no  flank pain.  Musculoskeletal: Negative for myalgias, back pain, joint pain and falls.  Skin: Negative for itching and rash.  Neurological: Negative for dizziness, tingling, tremors, sensory change, speech change, focal weakness, seizures, loss of consciousness, weakness and headaches.  Endo/Heme/Allergies: Negative for environmental allergies and polydipsia. Does not bruise/bleed easily.  SUBJECTIVE:   VITAL SIGNS: Temp:  [97.5 F (36.4 C)-102.4 F (39.1 C)] 100.2 F (37.9 C) (08/12 1000) Pulse Rate:  [79-115] 108 (08/12 1000) Resp:  [17-30] 26 (08/12 1000) BP: (139-201)/(64-90) 183/78 (08/12 1000) SpO2:  [93 %-100 %] 97 % (08/12 1000) FiO2 (%):  [100 %] 100 % (08/12 0807) Weight:  [61.4 kg] 61.4 kg (08/11 2150)  PHYSICAL EXAMINATION: General: Elderly man on BiPAP, in moderate distress, full facemask Neuro: Grossly nonfocal, follows commands and moves all 4 extremities HEENT: Pallor +, no icterus, no JVD Cardiovascular: S1-S2 tacky, regular Lungs: No rhonchi, decreased breath sounds bilateral on BiPAP transmitted sounds Abdomen: Soft nontender no guarding Musculoskeletal: No deformity Skin: No rash or mottling  Recent Labs  Lab 08/22/2018 1449 08/20/18 0509  NA 135 136  K 3.4* 3.9  CL 103 106  CO2 22 23  BUN 24* 21  CREATININE 1.28* 1.21  GLUCOSE 170* 112*   Recent Labs  Lab 08/21/2018 1449 08/16/2018 1925 08/20/18 0509  HGB 6.0* 5.9* 9.2*  HCT 19.4* 19.5* 28.8*  WBC 11.5* 12.6* 14.3*  PLT 163 158 136*   Dg Chest 1 View  Result Date: 08/20/2018 CLINICAL DATA:  Shortness of breath. Infection of larynx, trachea bronchi or lungs. EXAM: CHEST  1 VIEW COMPARISON:  Radiographs of August 19, 2018. CT scan of July 24th 2020. FINDINGS: Stable cardiac silhouette. Stable appearance of right upper lobe mass  with right paratracheal prominence consistent with adenopathy. No pneumothorax or pleural effusion is noted. No consolidative process is noted. Bony thorax is unremarkable. IMPRESSION: Stable appearance of right upper lobe mass with right paratracheal adenopathy. No significant change compared to prior exam. Electronically Signed   By: Marijo Conception M.D.   On: 08/20/2018 08:20   Ct Abdomen Pelvis W Contrast  Result Date: 08/12/2018 CLINICAL DATA:  Abdominal pain, known malignancy, recurrence neoplasm of the bladder EXAM: CT ABDOMEN AND PELVIS WITH CONTRAST TECHNIQUE: Multidetector CT imaging of the abdomen and pelvis was performed using the standard protocol following bolus administration of intravenous contrast. CONTRAST:  1107mL OMNIPAQUE IOHEXOL 300 MG/ML  SOLN COMPARISON:  August 01, 2018 FINDINGS: Lower chest: There is mild cardiomegaly. There is streaky atelectasis or scarring at the right lung base. No hiatal hernia. The visualized portions of the lungs are clear. Hepatobiliary: Multiple peripherally enhancing hypodense liver masses are seen throughout the liver parenchyma they have increased in size and number from the prior exam. For reference in the inferior right liver lobe the mass measured 1.2 cm on series 2, image 72, and now measures 3.1 cm seen on series 2, image 44. The main portal vein is patent. There is a moderate amount of perihepatic ascites. No evidence of calcified gallstones, gallbladder wall thickening or biliary dilatation. Pancreas: Unremarkable. No pancreatic ductal dilatation or surrounding inflammatory changes. Spleen: Normal in size without focal abnormality. Adrenals/Urinary Tract: Both  adrenal glands appear normal. Again noted is a left-sided double-J ureteral stent. There is moderate right hydronephrosis which is slightly decreased from the prior exam. There is bladder wall thickening, predominantly within the right bladder wall and bladder dome which appears unchanged from  prior. Stomach/Bowel: The stomach, small bowel, and colon are normal in appearance. No inflammatory changes, wall thickening, or obstructive findings. Vascular/Lymphatic: There are no enlarged mesenteric, retroperitoneal, or pelvic lymph nodes. Scattered aortic atherosclerotic calcifications are seen without aneurysmal dilatation. Reproductive: The prostate is unremarkable. Other: No evidence of abdominal wall mass or hernia. Musculoskeletal: No acute or significant osseous findings. IMPRESSION: 1. Numerous hypodense liver masses seen throughout which appear larger and more numerous in size from the prior exam August 01, 2018. This is concerning for infectious liver abscesses. 2. New small amount perihepatic and abdominal ascites. 3. Right-sided double-J ureteral stent with mild hydronephrosis 4. Bladder wall thickening/soft tissue nodule as on the prior exam. Electronically Signed   By: Prudencio Pair M.D.   On: 08/16/2018 18:15   Dg Chest Portable 1 View  Result Date: 08/18/2018 CLINICAL DATA:  RIGHT upper quadrant pain, distension and tenderness, shortness of breath. History of kidney cancer with hepatic metastases. EXAM: PORTABLE CHEST 1 VIEW COMPARISON:  Chest x-rays dated 08/01/2018 and 07/28/2017. FINDINGS: Heart size is within normal limits. Again noted is the RIGHT upper lobe pulmonary mass, not appreciably changed compared to most recent chest x-ray of 08/01/2018, better demonstrated on chest CT of 08/01/2018. Least mild mediastinal prominence corresponding to the mediastinal and hilar lymph node metastases described on earlier chest CT. No new lung findings. No pleural effusion or pneumothorax seen. Osseous structures about the chest are unremarkable. IMPRESSION: 1. No acute findings.  No evidence of pneumonia or pulmonary edema. 2. Grossly stable appearance of patient's previously demonstrated RIGHT upper lobe pulmonary mass. The mediastinal and hilar prominence is compatible with the previously  demonstrated lymph node metastases. Electronically Signed   By: Franki Cabot M.D.   On: 08/25/2018 15:59   Chest x-ray personally reviewed which shows stable right upper lobe mass, no evidence of edema  ASSESSMENT / PLAN:  Acute respiratory distress-unclear cause, x-ray appears to be unchanged there is no evidence of pneumonia or overt heart failure although he is received significant fluid and PRBCs.  ABG shows no AA gradient and good ventilation, so PE appears unlikely. Sepsis still a possible cause-his UA still shows more than 50 white cells, liver mets appear larger and raises consideration of abscesses but prior biopsy has shown malignant cells.  Recommend -Agree with IV cefepime while awaiting cultures -Continue BiPAP for now and give him a break in 2 to 4 hours to see if he tolerates -Advanced directives were discussed with patient and daughter, oncology input appreciated, DNR issued   Leanora Murin V. Elsworth Soho MD Pulmonary and [place] Pager: [phone]  08/20/2018, 10:41 AM

## 2018-08-20 NOTE — Progress Notes (Signed)
Wasted 1mg  of Morphine in stericycle with Janine Limbo RN. Order for 2mg , per physician to only give 1mg . Charted in [facility].

## 2018-08-20 NOTE — Progress Notes (Signed)
Bilateral lower extremity venous duplex completed. Verbal Preliminary report given face to face to [facility]. preliminary report pending in Chart review CV Proc. [facility], [place] 08/20/2018, 10:07 AM

## 2018-08-20 NOTE — Progress Notes (Addendum)
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  Patient presents to the ED with abdominal pain, fever and shortness of breath.  He was found to have a hemoglobin at 6 down from 10.  UA with large leukocytes more than 50 white blood cell.  Increase liver function test.  CT abdomen and pelvis show numerous hypodense liver masses which appear larger and more numerous in size from prior exam in July, concerning with liver abscess.  Mild ascites.  Right side double-J ureteral stent with mild hydronephrosis.  Patient was admitted to the stepdown unit, started on IV cefepime.  Overnight he became more tachypneic and hypoxic requiring nonrebreather mask.  On my evaluation patient continues to be tachypneic, using accessory muscles to breath.  Chest x-ray ordered stat, BiPAP order, IV Lasix and blood gas ordered.  Assessment & Plan:   Active Problems:   Abdominal pain  1-Acute hypoxic respiratory failure;  Patient develops worsening hypoxemia, hypercapnia overnight.  Getting chest x ray, ABG  IV lasix and BIPAP.  CCM consulted.  Per nursing staff Daughter on her way to hospital.   2-Bladder cancer, metastatic to liver and lung;  Discussed with Dr [name] patient is poor prognosis, he will follow in consultation.   3-Multiple liver masses; Transaminases Prior Biopsy showed; ischemic necrosis neoplasm.  Check Ammonia level.  Follow Dr [name] Recommendation.   4-Sepsis;  Patient presents with  fever, leukocytosis;  Follow urine culture, bloo culture.  ? Superimpose infection on  liver metaststatic diseases. Continue with cefepime. ID consulted.  5-Abdominal pain; related to liver mets.  PRN morphine.   6-HTN;  Continue with coreg, norvasc, PRN hydralazine.   7-Acute blood loss anemia;  S/P transfusion 2 units PRBC. Monitor for sign of bleeding.   8-COPD exacerbation Schedule nebulizer.    Estimated body mass index is 23.23 kg/m as calculated from the following:   Height as of this encounter: 5\' 4"  (1.626 m).   Weight as of this encounter: 61.4 kg.   DVT prophylaxis: scd Code Status: Discussed with Daughter who were at bedside, they agree with DNR status. Plan for palliative consult for goal of care.  Family Communication: no family at bedside Disposition Plan: remain in the step down Consultants:   CCM  Oncology   Procedures:   none  Antimicrobials:  cefepime  Subjective: He relate I have liver and bladder cancer.  I am short of breath.   Objective: Vitals:   08/20/18 0539 08/20/18 0601 08/20/18 0608 08/20/18 0732  BP:   (!) 193/84 (!) 201/81  Pulse:  (!) 106 (!) 103 (!) 112  Resp:  (!) 25 (!) 30 (!) 27  Temp:    (!) 100.7 F (38.2 C)  TempSrc:      SpO2: 97% 99% 100% 100%  Weight:      Height:        Intake/Output Summary (Last 24 hours) at 08/20/2018 0757 Last data filed at 08/20/2018 0144 Gross per 24 hour  Intake 833.54 ml  Output 875 ml  Net -41.46 ml  Filed Weights   08/25/2018 2150  Weight: 61.4 kg    Examination:  General exam: in distress Respiratory system: tachypnea, bilateral crackles.  Cardiovascular system: S1 & S2 heard, RRR. No JVD, murmurs, rubs, gallops or clicks. No pedal edema. Gastrointestinal system: BS present, distended, tender.  Central nervous system: Alert and oriented. Extremities: Symmetric 5 x 5 power. Skin: No rashes, lesions or ulcers Psychiatry: Judgement and insight appear normal. Mood & affect  appropriate.     Data Reviewed: I have personally reviewed following labs and imaging studies  CBC: Recent Labs  Lab 08/18/2018 1449 08/13/2018 1925 08/20/18 0509  WBC 11.5* 12.6* 14.3*  NEUTROABS 9.7*  --   --   HGB 6.0* 5.9* 9.2*  HCT 19.4* 19.5* 28.8*  MCV 78.2* 79.6* 83.5  PLT 163 158 938*   Basic Metabolic Panel: Recent Labs  Lab 09/01/2018 1449 08/20/18 0509  NA 135 136  K 3.4* 3.9  CL 103 106  CO2 22 23  GLUCOSE 170* 112*  BUN 24* 21  CREATININE 1.28* 1.21  CALCIUM 7.6* 7.3*  MG  --  1.7  PHOS  --  3.2   GFR: Estimated Creatinine Clearance: 45.5 mL/min (by C-G formula based on SCr of 1.21 mg/dL). Liver Function Tests: Recent Labs  Lab 08/27/2018 1449 08/20/18 0509  AST 129* 148*  ALT 67* 71*  ALKPHOS 642* 586*  BILITOT 2.5* 3.7*  PROT 5.6* 5.5*  ALBUMIN 2.1* 2.1*   Recent Labs  Lab 08/15/2018 1449  LIPASE 16   No results for input(s): AMMONIA in the last 168 hours. Coagulation Profile: Recent Labs  Lab 08/16/2018 1925  INR 1.4*   Cardiac Enzymes: No results for input(s): CKTOTAL, CKMB, CKMBINDEX, TROPONINI in the last 168 hours. BNP (last 3 results) No results for input(s): PROBNP in the last 8760 hours. HbA1C: No results for input(s): HGBA1C in the last 72 hours. CBG: Recent Labs  Lab 09/05/2018 2209 08/20/18 0730  GLUCAP 123* 97   Lipid Profile: No results for input(s): CHOL, HDL, LDLCALC, TRIG, CHOLHDL, LDLDIRECT in the last 72 hours. Thyroid Function Tests: No results for input(s): TSH, T4TOTAL, FREET4, T3FREE, THYROIDAB in the last 72 hours. Anemia Panel: No results for input(s): VITAMINB12, FOLATE, FERRITIN, TIBC, IRON, RETICCTPCT in the last 72 hours. Sepsis Labs: Recent Labs  Lab 08/17/2018 1454  LATICACIDVEN 1.7    Recent Results (from the past 240 hour(s))  SARS CORONAVIRUS 2 Nasal Swab Aptima Multi Swab     Status: None   Collection Time: 08/18/2018  7:37 PM   Specimen: Aptima Multi Swab; Nasal Swab  Result Value Ref Range  Status   SARS Coronavirus 2 NEGATIVE NEGATIVE Final    Comment: (NOTE) SARS-CoV-2 target nucleic acids are NOT DETECTED. The SARS-CoV-2 RNA is generally detectable in upper and lower respiratory specimens during the acute phase of infection. Negative results do not preclude SARS-CoV-2 infection, do not rule out co-infections with other pathogens, and should not be used as the sole basis for treatment or other patient management decisions. Negative results must be combined with clinical observations, patient history, and epidemiological information. The expected result is Negative. Fact Sheet for Patients: SugarRoll.be Fact Sheet for Healthcare Providers: https://www.woods-mathews.com/ This test is not yet approved or cleared by the [place] FDA and  has been authorized for detection and/or diagnosis of SARS-CoV-2 by FDA under an Emergency Use Authorization (EUA). This EUA will remain  in effect (meaning this test can be used) for the duration of the COVID-19 declaration under  Section 56 4(b)(1) of the Act, 21 U.S.C. section 360bbb-3(b)(1), unless the authorization is terminated or revoked sooner. Performed at [facility], [place] [address] [place], [place] [address]   MRSA PCR Screening     Status: None   Collection Time: 08/29/2018 10:42 PM   Specimen: Nasal Mucosa; Nasopharyngeal  Result Value Ref Range Status   MRSA by PCR NEGATIVE NEGATIVE Final    Comment:        The GeneXpert MRSA Assay (FDA approved for NASAL specimens only), is one component of a comprehensive MRSA colonization surveillance program. It is not intended to diagnose MRSA infection nor to guide or monitor treatment for MRSA infections. Performed at [facility], [place] [address] [place], [place] [address]          Radiology Studies: Ct Abdomen Pelvis W Contrast  Result Date: 08/31/2018 CLINICAL DATA:  Abdominal pain, known  malignancy, recurrence neoplasm of the bladder EXAM: CT ABDOMEN AND PELVIS WITH CONTRAST TECHNIQUE: Multidetector CT imaging of the abdomen and pelvis was performed using the standard protocol following bolus administration of intravenous contrast. CONTRAST:  182mL OMNIPAQUE IOHEXOL 300 MG/ML  SOLN COMPARISON:  August 01, 2018 FINDINGS: Lower chest: There is mild cardiomegaly. There is streaky atelectasis or scarring at the right lung base. No hiatal hernia. The visualized portions of the lungs are clear. Hepatobiliary: Multiple peripherally enhancing hypodense liver masses are seen throughout the liver parenchyma they have increased in size and number from the prior exam. For reference in the inferior right liver lobe the mass measured 1.2 cm on series 2, image 72, and now measures 3.1 cm seen on series 2, image 44. The main portal vein is patent. There is a moderate amount of perihepatic ascites. No evidence of calcified gallstones, gallbladder wall thickening or biliary dilatation. Pancreas: Unremarkable. No pancreatic ductal dilatation or surrounding inflammatory changes. Spleen: Normal in size without focal abnormality. Adrenals/Urinary Tract: Both adrenal glands appear normal. Again noted is a left-sided double-J ureteral stent. There is moderate right hydronephrosis which is slightly decreased from the prior exam. There is bladder wall thickening, predominantly within the right bladder wall and bladder dome which appears unchanged from prior. Stomach/Bowel: The stomach, small bowel, and colon are normal in appearance. No inflammatory changes, wall thickening, or obstructive findings. Vascular/Lymphatic: There are no enlarged mesenteric, retroperitoneal, or pelvic lymph nodes. Scattered aortic atherosclerotic calcifications are seen without aneurysmal dilatation. Reproductive: The prostate is unremarkable. Other: No evidence of abdominal wall mass or hernia. Musculoskeletal: No acute or significant osseous  findings. IMPRESSION: 1. Numerous hypodense liver masses seen throughout which appear larger and more numerous in size from the prior exam August 01, 2018. This is concerning for infectious liver abscesses. 2. New small amount perihepatic and abdominal ascites. 3. Right-sided double-J ureteral stent with mild hydronephrosis 4. Bladder wall thickening/soft tissue nodule as on the prior exam. Electronically Signed   By: Prudencio Pair M.D.   On: 08/20/2018 18:15   Dg Chest Portable 1 View  Result Date: 08/26/2018 CLINICAL DATA:  RIGHT upper quadrant pain, distension and tenderness, shortness of breath. History of kidney cancer with hepatic metastases. EXAM: PORTABLE CHEST 1 VIEW COMPARISON:  Chest x-rays dated 08/01/2018 and 07/28/2017. FINDINGS: Heart size is within normal limits. Again noted is the RIGHT upper lobe pulmonary mass, not appreciably changed compared to most recent chest x-ray of 08/01/2018, better demonstrated on chest CT of 08/01/2018. Least mild mediastinal prominence corresponding to the mediastinal and hilar lymph node metastases described  on earlier chest CT. No new lung findings. No pleural effusion or pneumothorax seen. Osseous structures about the chest are unremarkable. IMPRESSION: 1. No acute findings.  No evidence of pneumonia or pulmonary edema. 2. Grossly stable appearance of patient's previously demonstrated RIGHT upper lobe pulmonary mass. The mediastinal and hilar prominence is compatible with the previously demonstrated lymph node metastases. Electronically Signed   By: Franki Cabot M.D.   On: 08/09/2018 15:59        Scheduled Meds:  amLODipine  10 mg Oral Daily   aspirin EC  81 mg Oral Daily   carvedilol  3.125 mg Oral BID   Chlorhexidine Gluconate Cloth  6 each Topical Daily   insulin aspart  0-5 Units Subcutaneous QHS   insulin aspart  0-9 Units Subcutaneous TID WC   ipratropium-albuterol  3 mL Nebulization Q6H   latanoprost  1 drop Both Eyes QHS   mouth  rinse  15 mL Mouth Rinse BID   mometasone-formoterol  2 puff Inhalation BID   pantoprazole  40 mg Oral Q0600   senna-docusate  1 tablet Oral BID   tamsulosin  0.4 mg Oral Daily   umeclidinium bromide  1 puff Inhalation Daily   [START ON 08/25/2018] Vitamin D (Ergocalciferol)  50,000 Units Oral Weekly   Continuous Infusions:  sodium chloride Stopped (08/14/2018 2005)   ceFEPime (MAXIPIME) IV       LOS: 1 day    Time spent: 35 minutes.     Elmarie Shiley, MD [facility] Pager [phone]  If 7PM-7AM, please contact night-coverage [email or web address] Password [facility] 08/20/2018, 7:57 AM

## 2018-08-20 NOTE — Consult Note (Signed)
[facility] Infectious Disease  Total days of antibiotics 2 cefepime       Reason for Consult:liver lesions in setting of fever    Referring Physician: regalado  Active Problems:   Abdominal pain    HPI: [name] is a 73 y.o. male vietnamese who speaks english, with hx of CAD, COPD, with bladder cancer diagnosed in March 2018. He was found to have T2N0 urothelial carcinoma status post TURBT done on March 15, 2017 and  status post definitive radiation therapy with weekly carboplatin completed on May 23, 2017. He did well up until May 2020 when he was found to have recurrence of muscle invasive bladder cancer with right sided hydroureternephrosis s/p right neprhostomy tube. He was scheduled to have bladder cancer reresection and stent exchange on 7/24 however started to feel poorly and have fevers. He was admitted and work up revealed multiple hepatic lesions concerning for metastatic disease/stage IV. He underwent liver biopsy that showed ischemic necrotic neoplasm. He was also found to have kleb/PsA UTi for which he was treated and discharged on 7/29 to follow up with urology/oncology. He represents on 8/11 with worsening abdominal pain and nausea and fevers. Temp of 102F and associated tachycardia. WBC of 11.5K but also significant anemia with hgb 6 down from 10 roughly 12 days prior visit. repeat abdominal imaging suggests increase in liver metastatic lesions and new ascites but infection could not be ruled out. UA showed pyuria, rare bacteria. He was started empirically on cefepime. ID asked to weigh in on need for abtx. He was placed on NRB due to increase work of breathing but now feeling better. He is being supported on supplemental Oxygen via [place]. He feels poorly ,with abdominal discomfort but no longer febrile.   Past Medical History:  Diagnosis Date  . Allergic rhinitis   . Aortic atherosclerosis ([place]) 08/03/2016   Noted on CXR  . CAD S/P BMS PCI (mid RCA, prox RCA & RPAV)    cardiologist-  Dr. [name]: a). NSTEMI/[place] 06/2009: Mod CAD & (-) Myoview; b). 02/2012: Inf STEMI s/p Veriflex BMS PCI (3.5 x 12 - 3.75 mm) -mid RCA. c). Exertional Angina / + MYOVIEW -->  BMS PCI x 2- prox RCA (Veriflex BMS 4.0 x 12), rPAV (3.0 x 12);; d) LAST CATH 12/2014: 40% ost LAD, 80% ostial D2, 50% midCx.  Patent RCA - RPAV/PL stents.  Medical Rx  . Centriacinar emphysema ([place])   . Chest pain x 1 in last 6 months  . COPD, group C, by GOLD 2017 classification [facility]) pulmonologist-- dr [email or web address]   former smoker--- severe OSA w/ hx multiple hospital admissions w/ exacerbation's  . Dysuria   . Emphysema lung ([place]) 08/03/2016   Noted on CXR  . Essential hypertension   . Full dentures   . GERD (gastroesophageal reflux disease)   . Grade I diastolic dysfunction 94/07/6806   noted on ECHO  . Hematuria 04/02/2018  . History of cardiomegaly    Noted on CXR 07/28/2017  . History of non-ST elevation myocardial infarction (NSTEMI) 06/19/2009   in setting COPD w/ exacerbation;  negative myoview for ischemia;  previously had cardiac cath 06-08-2009 w/ diffuse nonobtructive CAD and  normal LVF  . History of prostatitis   . History of ST elevation myocardial infarction (STEMI) 02/15/2012   in setting of COPD w/ exacerbation; per cardiac cath PCI and stenting to RCA  . Hyperlipidemia   . Lower abdominal pain   . LVH (left ventricular hypertrophy) 07/27/2017  Moderate, noted on ECHO  . Pneumonia 07/28/2017   Noted on CXR   . Pulmonary nodules 03/05/2018   Stable 4 mm left upper lobe nodule  . Recurrent malignant neoplasm of bladder [facility]) urologist-  dr [name]   previously dx 08/ 2018 and 10/ 2018 w/ high grade urothelial carcinoma with no muscle involvement  . Type 2 diabetes mellitus ([place])   . Wears glasses     Allergies: No Known Allergies  MEDICATIONS: . amLODipine  10 mg Oral Daily  . aspirin EC  81 mg Oral Daily  . carvedilol  3.125 mg Oral BID  . Chlorhexidine Gluconate Cloth  6 each  Topical Daily  . insulin aspart  0-5 Units Subcutaneous QHS  . insulin aspart  0-9 Units Subcutaneous TID WC  . ipratropium-albuterol  3 mL Nebulization Q6H  . latanoprost  1 drop Both Eyes QHS  . mouth rinse  15 mL Mouth Rinse BID  . mometasone-formoterol  2 puff Inhalation BID  . pantoprazole  40 mg Oral Q0600  . senna-docusate  1 tablet Oral BID  . tamsulosin  0.4 mg Oral Daily  . umeclidinium bromide  1 puff Inhalation Daily  . [START ON 08/25/2018] Vitamin D (Ergocalciferol)  50,000 Units Oral Weekly    Social History   Tobacco Use  . Smoking status: Former Smoker    Packs/day: 1.00    Years: 50.00    Pack years: 50.00    Types: Cigarettes    Quit date: 01/09/2008    Years since quitting: 10.6  . Smokeless tobacco: Never Used  . Tobacco comment: rolled own cigarettes since age of 60,   Substance Use Topics  . Alcohol use: Not Currently    Comment: occ  . Drug use: No    Family History  Problem Relation Age of Onset  . CAD Neg Hx        no known FHx of early CAD    Social History   Tobacco Use  . Smoking status: Former Smoker    Packs/day: 1.00    Years: 50.00    Pack years: 50.00    Types: Cigarettes    Quit date: 01/09/2008    Years since quitting: 10.6  . Smokeless tobacco: Never Used  . Tobacco comment: rolled own cigarettes since age of 5,   Substance Use Topics  . Alcohol use: Not Currently    Comment: occ  . Drug use: No    Family hx: no hx of liver cancer, but has family with DM and CAD  Review of Systems  Constitutional: positive for fever, chills, diaphoresis, activity change, appetite change, fatigue and unexpected weight change.  HENT: Negative for congestion, sore throat, rhinorrhea, sneezing, trouble swallowing and sinus pressure.  Eyes: Negative for photophobia and visual disturbance.  Respiratory: Negative for cough, chest tightness, shortness of breath, wheezing and stridor.  Cardiovascular: Negative for chest pain, palpitations and leg  swelling.  Gastrointestinal: positive for nausea, vomiting, abdominal pain, but no diarrhea, constipation, blood in stool, abdominal distention and anal bleeding.  Genitourinary: Negative for dysuria, hematuria, flank pain and difficulty urinating.  Musculoskeletal: Negative for myalgias, back pain, joint swelling, arthralgias and gait problem.  Skin: Negative for color change, pallor, rash and wound.  Neurological: Negative for dizziness, tremors, weakness and light-headedness.  Hematological: Negative for adenopathy. Does not bruise/bleed easily.  Psychiatric/Behavioral: Negative for behavioral problems, confusion, sleep disturbance, dysphoric mood, decreased concentration and agitation.     OBJECTIVE: Temp:  [97.5 F (36.4 C)-102.4 F (  39.1 C)] 100 F (37.8 C) (08/12 1600) Pulse Rate:  [79-115] 95 (08/12 1238) Resp:  [17-30] 23 (08/12 1600) BP: (133-201)/(58-90) 137/61 (08/12 1600) SpO2:  [93 %-100 %] 100 % (08/12 1553) FiO2 (%):  [100 %] 100 % (08/12 0807) Weight:  [61.4 kg] 61.4 kg (08/11 2150) Physical Exam  Constitutional: He is oriented to person, place, and time. He appears stated age and well-nourished. No distress.  HENT: closes right eye Mouth/Throat: Oropharynx is clear and moist. No oropharyngeal exudate.  Cardiovascular: tachycardic, regular rhythm and normal heart sounds. Exam reveals no gallop and no friction rub.  No murmur heard.  Pulmonary/Chest: Effort normal and breath sounds normal. No respiratory distress. He has no wheezes.  Abdominal: Soft. Bowel sounds are normal. He is diffusely tender with palpable liver edge/contour.[name]  Lymphadenopathy:  He has no cervical adenopathy.  Neurological: He is alert and oriented to person, place, and time.  Skin: Skin is warm and dry. No rash noted. No erythema.  Psychiatric: He has a normal mood and affect. His behavior is normal.     LABS: Results for orders placed or performed during the hospital encounter of 08/23/2018  (from the past 48 hour(s))  Comprehensive metabolic panel     Status: Abnormal   Collection Time: 08/11/2018  2:49 PM  Result Value Ref Range   Sodium 135 135 - 145 mmol/L   Potassium 3.4 (L) 3.5 - 5.1 mmol/L   Chloride 103 98 - 111 mmol/L   CO2 22 22 - 32 mmol/L   Glucose, Bld 170 (H) 70 - 99 mg/dL   BUN 24 (H) 8 - 23 mg/dL   Creatinine, Ser 1.28 (H) 0.61 - 1.24 mg/dL   Calcium 7.6 (L) 8.9 - 10.3 mg/dL   Total Protein 5.6 (L) 6.5 - 8.1 g/dL   Albumin 2.1 (L) 3.5 - 5.0 g/dL   AST 129 (H) 15 - 41 U/L   ALT 67 (H) 0 - 44 U/L   Alkaline Phosphatase 642 (H) 38 - 126 U/L   Total Bilirubin 2.5 (H) 0.3 - 1.2 mg/dL   GFR calc non Af Amer 55 (L) >60 mL/min   GFR calc Af Amer >60 >60 mL/min   Anion gap 10 5 - 15    Comment: Performed at [facility], [place] [address], [place], [place] [address]  Lipase, blood     Status: None   Collection Time: 08/26/2018  2:49 PM  Result Value Ref Range   Lipase 16 11 - 51 U/L    Comment: Performed at [facility], [place] [address], [place], [place] [address]  CBC with Diff     Status: Abnormal   Collection Time: 09/05/2018  2:49 PM  Result Value Ref Range   WBC 11.5 (H) 4.0 - 10.5 K/uL   RBC 2.48 (L) 4.22 - 5.81 MIL/uL   Hemoglobin 6.0 (LL) 13.0 - 17.0 g/dL    Comment: REPEATED TO VERIFY THIS CRITICAL RESULT HAS VERIFIED AND BEEN CALLED TO BULLOCKSKI,A RN BY MARINDA BLACK ON 08 11 2020 AT 1620, AND HAS BEEN READ BACK. CRITCAL HGB RBV BULLOCKSKI, A RN     HCT 19.4 (L) 39.0 - 52.0 %   MCV 78.2 (L) 80.0 - 100.0 fL   MCH 24.2 (L) 26.0 - 34.0 pg   MCHC 30.9 30.0 - 36.0 g/dL   RDW 18.0 (H) 11.5 - 15.5 %   Platelets 163 150 - 400 K/uL   nRBC 0.0 0.0 - 0.2 %  Neutrophils Relative % 85 %   Neutro Abs 9.7 (H) 1.7 - 7.7 K/uL   Lymphocytes Relative 5 %   Lymphs Abs 0.6 (L) 0.7 - 4.0 K/uL   Monocytes Relative 9 %   Monocytes Absolute 1.1 (H) 0.1 - 1.0 K/uL   Eosinophils Relative 0 %   Eosinophils Absolute 0.0 0.0 - 0.5 K/uL    Basophils Relative 0 %   Basophils Absolute 0.0 0.0 - 0.1 K/uL   Immature Granulocytes 1 %   Abs Immature Granulocytes 0.15 (H) 0.00 - 0.07 K/uL    Comment: Performed at [facility], [place] [address], [place], [place] [address]  Lactic acid, plasma     Status: None   Collection Time: 08/21/2018  2:54 PM  Result Value Ref Range   Lactic Acid, Venous 1.7 0.5 - 1.9 mmol/L    Comment: Performed at [facility], [place] [address] [place], [place] [address]  Blood culture (routine x 2)     Status: None (Preliminary result)   Collection Time: 08/09/2018  3:22 PM   Specimen: BLOOD  Result Value Ref Range   Specimen Description      BLOOD LEFT ANTECUBITAL Performed at [facility], [place] [address] [place], [place] [address]    Special Requests      BOTTLES DRAWN AEROBIC AND ANAEROBIC Blood Culture adequate volume Performed at [place] [address] [place], [place] [address]    Culture      NO GROWTH < 24 HOURS Performed at [place] [address], [place], [place] [address]    Report Status PENDING   Blood culture (routine x 2)     Status: None (Preliminary result)   Collection Time: 08/17/2018  3:22 PM   Specimen: BLOOD RIGHT HAND  Result Value Ref Range   Specimen Description      BLOOD RIGHT HAND Performed at [place] [address] [place], [place] [address]    Special Requests      BOTTLES DRAWN AEROBIC AND ANAEROBIC Blood Culture adequate volume Performed at [place] [address] [place], [place] [address]    Culture      NO GROWTH < 24 HOURS Performed at [place] [address] [place], [place] [address]    Report Status PENDING   Urinalysis, Routine w reflex microscopic     Status: Abnormal   Collection Time: 08/23/2018  3:23 PM  Result Value Ref Range   Color, Urine AMBER (A) YELLOW    Comment: BIOCHEMICALS MAY BE  AFFECTED BY COLOR   APPearance HAZY (A) CLEAR   Specific Gravity, Urine 1.015 1.005 - 1.030   pH 5.0 5.0 - 8.0   Glucose, UA NEGATIVE NEGATIVE mg/dL   Hgb urine dipstick LARGE (A) NEGATIVE   Bilirubin Urine NEGATIVE NEGATIVE   Ketones, ur NEGATIVE NEGATIVE mg/dL   Protein, ur 30 (A) NEGATIVE mg/dL   Nitrite NEGATIVE NEGATIVE   Leukocytes,Ua LARGE (A) NEGATIVE   RBC / HPF >50 (H) 0 - 5 RBC/hpf   WBC, UA >50 (H) 0 - 5 WBC/hpf   Bacteria, UA RARE (A) NONE SEEN   Squamous Epithelial / LPF 0-5 0 - 5   Mucus PRESENT     Comment: Performed at [facility], [place] [address], [place], [place] [address]  Urine Culture     Status: Abnormal   Collection Time: 08/09/2018  3:23 PM  Specimen: Urine, Clean Catch  Result Value Ref Range   Specimen Description      URINE, CLEAN CATCH Performed at [facility], [place] [address], [place], [place] [address]    Special Requests      NONE Performed at [facility], [place] [address], [place], [place] [address]    Culture (A)     <10,000 COLONIES/mL INSIGNIFICANT GROWTH Performed at [place] [address], [place], [place] [address]    Report Status 08/20/2018 FINAL   Type and screen [place]     Status: None   Collection Time: 09/02/2018  5:18 PM  Result Value Ref Range   ABO/RH(D) B POS    Antibody Screen NEG    Sample Expiration 08/22/2018,2359    Unit Number [id number]    Blood Component Type RED CELLS,LR    Unit division 00    Status of Unit ISSUED,FINAL    Transfusion Status OK TO TRANSFUSE    Crossmatch Result      Compatible Performed at [facility], [place] [address] [place], [place] [address]    Unit Number [id number]    Blood Component Type RED CELLS,LR    Unit division 00    Status of Unit ISSUED,FINAL    Transfusion Status OK TO TRANSFUSE    Crossmatch Result Compatible   Prepare RBC     Status: None   Collection Time:  08/18/2018  5:18 PM  Result Value Ref Range   Order Confirmation      ORDER PROCESSED BY BLOOD BANK Performed at [facility], [place] [address], [place], [place] [address]   ABO/Rh     Status: None   Collection Time: 08/26/2018  5:18 PM  Result Value Ref Range   ABO/RH(D)      B POS Performed at [facility], [place] [address] [place], [place] [address]   POC occult blood, ED     Status: None   Collection Time: 08/14/2018  5:29 PM  Result Value Ref Range   Fecal Occult Bld NEGATIVE NEGATIVE  CBC     Status: Abnormal   Collection Time: 08/21/2018  7:25 PM  Result Value Ref Range   WBC 12.6 (H) 4.0 - 10.5 K/uL   RBC 2.45 (L) 4.22 - 5.81 MIL/uL   Hemoglobin 5.9 (LL) 13.0 - 17.0 g/dL    Comment: CRITICAL VALUE NOTED.  VALUE IS CONSISTENT WITH PREVIOUSLY REPORTED AND CALLED VALUE.   HCT 19.5 (L) 39.0 - 52.0 %   MCV 79.6 (L) 80.0 - 100.0 fL   MCH 24.1 (L) 26.0 - 34.0 pg   MCHC 30.3 30.0 - 36.0 g/dL   RDW 18.1 (H) 11.5 - 15.5 %   Platelets 158 150 - 400 K/uL   nRBC 0.0 0.0 - 0.2 %    Comment: Performed at [facility], [place] [address] [place], [place] [address]  Protime-INR     Status: Abnormal   Collection Time: 08/17/2018  7:25 PM  Result Value Ref Range   Prothrombin Time 17.3 (H) 11.4 - 15.2 seconds   INR 1.4 (H) 0.8 - 1.2    Comment: (NOTE) INR goal varies based on device and disease states. Performed at [facility], [place] [address] [place], [place] [address]   SARS CORONAVIRUS 2 Nasal Swab Aptima Multi Swab     Status: None   Collection Time: 08/31/2018  7:37 PM   Specimen: Aptima  Multi Swab; Nasal Swab  Result Value Ref Range   SARS Coronavirus 2 NEGATIVE NEGATIVE    Comment: (NOTE) SARS-CoV-2 target nucleic acids are NOT DETECTED. The SARS-CoV-2 RNA is generally detectable in upper and lower respiratory specimens during the acute phase of infection. Negative results do not preclude SARS-CoV-2 infection,  do not rule out co-infections with other pathogens, and should not be used as the sole basis for treatment or other patient management decisions. Negative results must be combined with clinical observations, patient history, and epidemiological information. The expected result is Negative. Fact Sheet for Patients: SugarRoll.be Fact Sheet for Healthcare Providers: https://www.woods-mathews.com/ This test is not yet approved or cleared by the [place] FDA and  has been authorized for detection and/or diagnosis of SARS-CoV-2 by FDA under an Emergency Use Authorization (EUA). This EUA will remain  in effect (meaning this test can be used) for the duration of the COVID-19 declaration under Section 56 4(b)(1) of the Act, 21 U.S.C. section 360bbb-3(b)(1), unless the authorization is terminated or revoked sooner. Performed at [facility], [place] [address] [place], [place] [address]   Glucose, capillary     Status: Abnormal   Collection Time: 08/28/2018 10:09 PM  Result Value Ref Range   Glucose-Capillary 123 (H) 70 - 99 mg/dL  MRSA PCR Screening     Status: None   Collection Time: 08/09/2018 10:42 PM   Specimen: Nasal Mucosa; Nasopharyngeal  Result Value Ref Range   MRSA by PCR NEGATIVE NEGATIVE    Comment:        The GeneXpert MRSA Assay (FDA approved for NASAL specimens only), is one component of a comprehensive MRSA colonization surveillance program. It is not intended to diagnose MRSA infection nor to guide or monitor treatment for MRSA infections. Performed at [facility], [place] [address] [place], [place] [address]   Magnesium     Status: None   Collection Time: 08/20/18  5:09 AM  Result Value Ref Range   Magnesium 1.7 1.7 - 2.4 mg/dL    Comment: Performed at [facility], [place] [address], [place], [place] [address]  Phosphorus     Status: None   Collection Time: 08/20/18  5:09 AM   Result Value Ref Range   Phosphorus 3.2 2.5 - 4.6 mg/dL    Comment: Performed at [facility], [place] [address], [place], [place] [address]  Comprehensive metabolic panel     Status: Abnormal   Collection Time: 08/20/18  5:09 AM  Result Value Ref Range   Sodium 136 135 - 145 mmol/L   Potassium 3.9 3.5 - 5.1 mmol/L   Chloride 106 98 - 111 mmol/L   CO2 23 22 - 32 mmol/L   Glucose, Bld 112 (H) 70 - 99 mg/dL   BUN 21 8 - 23 mg/dL   Creatinine, Ser 1.21 0.61 - 1.24 mg/dL   Calcium 7.3 (L) 8.9 - 10.3 mg/dL   Total Protein 5.5 (L) 6.5 - 8.1 g/dL   Albumin 2.1 (L) 3.5 - 5.0 g/dL   AST 148 (H) 15 - 41 U/L   ALT 71 (H) 0 - 44 U/L   Alkaline Phosphatase 586 (H) 38 - 126 U/L   Total Bilirubin 3.7 (H) 0.3 - 1.2 mg/dL   GFR calc non Af Amer 59 (L) >60 mL/min   GFR calc Af Amer >60 >60 mL/min   Anion gap 7 5 - 15    Comment: Performed at [facility], [place]  [address] [place], [place] [address]  CBC     Status: Abnormal   Collection Time: 08/20/18  5:09 AM  Result Value Ref Range   WBC 14.3 (H) 4.0 - 10.5 K/uL   RBC 3.45 (L) 4.22 - 5.81 MIL/uL   Hemoglobin 9.2 (L) 13.0 - 17.0 g/dL    Comment: REPEATED TO VERIFY POST TRANSFUSION SPECIMEN DELTA CHECK NOTED    HCT 28.8 (L) 39.0 - 52.0 %   MCV 83.5 80.0 - 100.0 fL   MCH 26.7 26.0 - 34.0 pg   MCHC 31.9 30.0 - 36.0 g/dL   RDW 17.2 (H) 11.5 - 15.5 %   Platelets 136 (L) 150 - 400 K/uL    Comment: REPEATED TO VERIFY SPECIMEN CHECKED FOR CLOTS CONSISTENT WITH PREVIOUS RESULT    nRBC 0.2 0.0 - 0.2 %    Comment: Performed at [facility], [place] [address] [place], [place] [address]  Bilirubin, direct     Status: Abnormal   Collection Time: 08/20/18  5:09 AM  Result Value Ref Range   Bilirubin, Direct 2.4 (H) 0.0 - 0.2 mg/dL    Comment: Performed at [facility], [place] [address] [place], [place] [address]  Glucose, capillary     Status: None   Collection Time: 08/20/18   7:30 AM  Result Value Ref Range   Glucose-Capillary 97 70 - 99 mg/dL  Culture, sputum-assessment     Status: None   Collection Time: 08/20/18  7:47 AM   Specimen: Sputum  Result Value Ref Range   Specimen Description SPUTUM    Special Requests NONE    Sputum evaluation      THIS SPECIMEN IS ACCEPTABLE FOR SPUTUM CULTURE Performed at [facility], [place] [address] [place], [place] [address]    Report Status 08/20/2018 FINAL   Culture, respiratory     Status: None (Preliminary result)   Collection Time: 08/20/18  7:47 AM   Specimen: SPU  Result Value Ref Range   Specimen Description      SPUTUM Performed at [facility], [place] [address], [place], [place] [address]    Special Requests      NONE Reflexed from [id number] Performed at [facility], [place] [address], [place], [place] [address]    Gram Stain      RARE WBC PRESENT, PREDOMINANTLY PMN FEW GRAM POSITIVE COCCI FEW GRAM NEGATIVE RODS Performed at [facility], [place] [address], [place], [place] [address]    Culture PENDING    Report Status PENDING   Lactic acid, plasma     Status: None   Collection Time: 08/20/18  8:00 AM  Result Value Ref Range   Lactic Acid, Venous 1.6 0.5 - 1.9 mmol/L    Comment: Performed at [facility], [place] [address], [place], [place] [address]  Ammonia     Status: None   Collection Time: 08/20/18  8:00 AM  Result Value Ref Range   Ammonia 29 9 - 35 umol/L    Comment: Performed at [facility], [place] [address], [place], [place] [address]  Blood gas, arterial     Status: Abnormal (Preliminary result)   Collection Time: 08/20/18  8:14 AM  Result Value Ref Range   FIO2 1.00    Delivery systems BILEVEL POSITIVE AIRWAY PRESSURE    Mode BILEVEL POSITIVE AIRWAY PRESSURE    Inspiratory PAP 10.0    Expiratory PAP 5.0    pH, Arterial 7.497 (  H) 7.350 - 7.450   pCO2 arterial 26.2 (L) 32.0 - 48.0 mmHg   pO2,  Arterial 443 (H) 83.0 - 108.0 mmHg   Bicarbonate 20.1 20.0 - 28.0 mmol/L   Acid-base deficit 1.9 0.0 - 2.0 mmol/L   O2 Saturation 99.9 %   Patient temperature 98.6    Collection site RIGHT RADIAL    Drawn by [id number]    Sample type ARTERIAL DRAW     Comment: Performed at [facility], [place] [address], [place], [place] [address]   Allens test (pass/fail) PENDING PASS  Lactic acid, plasma     Status: None   Collection Time: 08/20/18 10:26 AM  Result Value Ref Range   Lactic Acid, Venous 1.4 0.5 - 1.9 mmol/L    Comment: Performed at [facility], [place] [address], [place], [place] [address]  Glucose, capillary     Status: None   Collection Time: 08/20/18 11:42 AM  Result Value Ref Range   Glucose-Capillary 91 70 - 99 mg/dL  Glucose, capillary     Status: Abnormal   Collection Time: 08/20/18  3:46 PM  Result Value Ref Range   Glucose-Capillary 111 (H) 70 - 99 mg/dL    MICRO: reviwed IMAGING: IMPRESSION: 1. Numerous hypodense liver masses seen throughout which appear larger and more numerous in size from the prior exam August 01, 2018. This is concerning for infectious liver abscesses. 2. New small amount perihepatic and abdominal ascites. 3. Right-sided double-J ureteral stent with mild hydronephrosis 4. Bladder wall thickening/soft tissue nodule as on the prior exam.  Assessment/Plan:  73yo M with DM, CAD, COPD with metastatic bladder ca admitted for fevers, abdominal pain found to have worsening liver lesions, infection not rule out as of yet. Empirically on cefepime since hx of GNR UTI  - continue on cefepime for now - follow culture data - consider having ascites sampled for count and culture - suspect fevers are due to malignancy  - if cultures ngtd at 48-72hr, would discontinue abtx  - defer to oncology how to best move forward with providing relief/palliative treatment - anemia appears improved post transfusion  Jacquelene Kopecky B. [place] [facility] Infect[facility] [phone]

## 2018-08-20 NOTE — Telephone Encounter (Signed)
Called pt daughter per 8/12 sch message- no answer - left message and mailed letter with appt .

## 2018-08-20 NOTE — Consult Note (Signed)
Consultation Note Date: 08/20/2018   Patient Name: [name]  DOB: 25-May-1945  MRN: [id number]  Age / Sex: 73 y.o., male  PCP: [name], MD Referring Physician: Elmarie Shiley, MD  Reason for Consultation: Establishing goals of care  HPI/Patient Profile: 73 y.o. 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  Palliative consulted for GOC   Clinical Assessment and Goals of Care: Palliative care consult received.  Chart reviewed including personal review of pertinent labs and imaging.    Discussed with Dr. Tyrell Antonio and bedside RN.  I met today with patient at his bedside.  He is limited in his [name], but was not interested in trying to communicate of use interprative services.  States only, "Get me water."  I called and was able to reach his daughter, [name].  She reports that the physicians have been doing a good job of explaining things and she understands how ill her father is.  We reviewed his clinical course with his cancer and this hospitalization.  She has discussed with family and they understand how ill he is and the need for discussions regarding [place].  At the same time, family feels that they need to see how he does over the next couple of days as this will impact decision making.  SUMMARY OF RECOMMENDATIONS   - Continue current interventions.  Family would like input from ID and to see his clinical course over the next 24-48 hours before considering long term [place].  They have established limit of DNR/DNI. - Daughter to be at bedside tomorrow.  Will plan for f/u tomorrow.  Code Status/Advance Care Planning:  DNR   Prognosis:  Guarded  Discharge Planning: To Be Determined      Primary Diagnoses: Present on Admission: . Abdominal pain   I have reviewed the medical record,  interviewed the patient and family, and examined the patient. The following aspects are pertinent.  Past Medical History:  Diagnosis Date  . Allergic rhinitis   . Aortic atherosclerosis ([place]) 08/03/2016   Noted on CXR  . CAD S/P BMS PCI (mid RCA, prox RCA & RPAV)    cardiologist-  Dr. [name]: a). NSTEMI/[place] 06/2009: Mod CAD & (-) Myoview; b). 02/2012: Inf STEMI s/p Veriflex BMS PCI (3.5 x 12 - 3.75 mm) -mid RCA. c). Exertional Angina / + MYOVIEW -->  BMS PCI x 2- prox RCA (Veriflex BMS 4.0 x 12), rPAV (3.0 x 12);; d) LAST CATH 12/2014: 40% ost LAD, 80% ostial D2, 50% midCx.  Patent RCA - RPAV/PL stents.  Medical Rx  . Centriacinar emphysema ([place])   . Chest pain x 1 in last 6 months  . COPD, group C, by GOLD 2017 classification [facility]) pulmonologist-- dr mcquid   former smoker--- severe OSA w/ hx multiple hospital admissions w/ exacerbation's  . Dysuria   . Emphysema lung ([place]) 08/03/2016   Noted on CXR  . Essential hypertension   . Full dentures   .  GERD (gastroesophageal reflux disease)   . Grade I diastolic dysfunction 80/99/8338   noted on ECHO  . Hematuria 04/02/2018  . History of cardiomegaly    Noted on CXR 07/28/2017  . History of non-ST elevation myocardial infarction (NSTEMI) 06/19/2009   in setting COPD w/ exacerbation;  negative myoview for ischemia;  previously had cardiac cath 06-08-2009 w/ diffuse nonobtructive CAD and  normal LVF  . History of prostatitis   . History of ST elevation myocardial infarction (STEMI) 02/15/2012   in setting of COPD w/ exacerbation; per cardiac cath PCI and stenting to RCA  . Hyperlipidemia   . Lower abdominal pain   . LVH (left ventricular hypertrophy) 07/27/2017   Moderate, noted on ECHO  . Pneumonia 07/28/2017   Noted on CXR   . Pulmonary nodules 03/05/2018   Stable 4 mm left upper lobe nodule  . Recurrent malignant neoplasm of bladder [facility]) urologist-  dr [name]   previously dx 08/ 2018 and 10/ 2018 w/ high grade urothelial carcinoma  with no muscle involvement  . Type 2 diabetes mellitus ([place])   . Wears glasses    Social History   Socioeconomic History  . Marital status: Married    Spouse name: Not on file  . Number of children: Not on file  . Years of education: Not on file  . Highest education level: Not on file  Occupational History  . Occupation: disability  Social Needs  . Financial resource strain: Not on file  . Food insecurity    Worry: Not on file    Inability: Not on file  . Transportation needs    Medical: Not on file    Non-medical: Not on file  Tobacco Use  . Smoking status: Former Smoker    Packs/day: 1.00    Years: 50.00    Pack years: 50.00    Types: Cigarettes    Quit date: 01/09/2008    Years since quitting: 10.6  . Smokeless tobacco: Never Used  . Tobacco comment: rolled own cigarettes since age of 85,   Substance and Sexual Activity  . Alcohol use: Not Currently    Comment: occ  . Drug use: No  . Sexual activity: Not on file  Lifestyle  . Physical activity    Days per week: Not on file    Minutes per session: Not on file  . Stress: Not on file  Relationships  . Social Herbalist on phone: Not on file    Gets together: Not on file    Attends religious service: Not on file    Active member of club or organization: Not on file    Attends meetings of clubs or organizations: Not on file    Relationship status: Not on file  Other Topics Concern  . Not on file  Social History Narrative   Married, 4 children.   Disability secondary to lung disease.    No known family history of premature CAD.   Family History  Problem Relation Age of Onset  . CAD Neg Hx        no known FHx of early CAD   Scheduled Meds: . amLODipine  10 mg Oral Daily  . aspirin EC  81 mg Oral Daily  . carvedilol  3.125 mg Oral BID  . Chlorhexidine Gluconate Cloth  6 each Topical Daily  . insulin aspart  0-5 Units Subcutaneous QHS  . insulin aspart  0-9 Units Subcutaneous TID WC  .  ipratropium-albuterol  3  mL Nebulization Q6H  . latanoprost  1 drop Both Eyes QHS  . mouth rinse  15 mL Mouth Rinse BID  . mometasone-formoterol  2 puff Inhalation BID  . pantoprazole  40 mg Oral Q0600  . senna-docusate  1 tablet Oral BID  . tamsulosin  0.4 mg Oral Daily  . umeclidinium bromide  1 puff Inhalation Daily  . [START ON 08/25/2018] Vitamin D (Ergocalciferol)  50,000 Units Oral Weekly   Continuous Infusions: . sodium chloride Stopped (08/25/2018 2005)  . ceFEPime (MAXIPIME) IV Stopped (08/20/18 1048)   PRN Meds:.albuterol, morphine injection Medications Prior to Admission:  Prior to Admission medications   Medication Sig Start Date End Date Taking? Authorizing Provider  acetaminophen (TYLENOL) 325 MG tablet Take 2 tablets (650 mg total) by mouth every 6 (six) hours as needed for mild pain (or Fever >/= 101). 08/06/18  Yes Eugenie Filler, MD  albuterol (PROVENTIL HFA) 108 (90 BASE) MCG/ACT inhaler Inhale 2 puffs into the lungs 2 (two) times daily. Patient taking differently: Inhale 2 puffs into the lungs every 6 (six) hours as needed for wheezing or shortness of breath.  05/06/14  Yes Robbie Lis, MD  albuterol (PROVENTIL) (2.5 MG/3ML) 0.083% nebulizer solution Take 2.5 mg by nebulization every 6 (six) hours as needed for wheezing or shortness of breath.   Yes [provider]  amLODipine (NORVASC) 10 MG tablet Take 10 mg by mouth daily.  04/07/17  Yes [provider]  aspirin EC 81 MG tablet Take 81 mg by mouth daily.   Yes [provider]  carvedilol (COREG) 3.125 MG tablet Take 1 tablet (3.125 mg total) by mouth 2 (two) times daily. 08/06/18  Yes Eugenie Filler, MD  folic acid (FOLVITE) 1 MG tablet Take 1 mg by mouth daily. 04/20/18  Yes [provider]  ibuprofen (ADVIL) 200 MG tablet Take 400 mg by mouth 2 (two) times a day.   Yes [provider]  latanoprost (XALATAN) 0.005 % ophthalmic solution Place 1 drop into both eyes at  bedtime. 07/14/18  Yes [provider]  meloxicam (MOBIC) 15 MG tablet Take 15 mg by mouth daily. 08/07/18  Yes [provider]  metFORMIN (GLUCOPHAGE) 500 MG tablet Take 500 mg by mouth 2 (two) times daily with a meal.    Yes [provider]  nitroGLYCERIN (NITROSTAT) 0.4 MG SL tablet Place 1 tablet (0.4 mg total) under the tongue every 5 (five) minutes as needed for chest pain. 01/05/15  Yes [name], PA  pantoprazole (PROTONIX) 40 MG tablet Take 1 tablet (40 mg total) by mouth daily at 6 (six) AM. 08/07/18  Yes Eugenie Filler, MD  potassium chloride SA (K-DUR) 20 MEQ tablet Take 20 mEq by mouth daily.   Yes [provider]  rosuvastatin (CRESTOR) 40 MG tablet Take 40 mg by mouth daily. 11/29/17  Yes [provider]  senna-docusate (SENOKOT-S) 8.6-50 MG tablet Take 1 tablet by mouth 2 (two) times daily. 08/06/18  Yes Eugenie Filler, MD  SYMBICORT 160-4.5 MCG/ACT inhaler Inhale 2 puffs into the lungs 2 (two) times a day.  05/29/17  Yes [provider]  tamsulosin (FLOMAX) 0.4 MG CAPS capsule Take 2 capsules (0.8 mg total) by mouth every evening. Patient taking differently: Take 0.4 mg by mouth daily after breakfast.  08/08/16  Yes Waldron Session, MD  Tiotropium Bromide Monohydrate (SPIRIVA RESPIMAT) 2.5 MCG/ACT AERS 2 pffs each am Patient taking differently: Take 2 puffs by mouth every morning. 2  pffs each am 06/03/18  Yes Tanda Rockers, MD  Vitamin D, Ergocalciferol, (DRISDOL) 50000 units CAPS capsule Take 50,000 Units by mouth once a week.  03/28/17  Yes [provider]  budesonide (PULMICORT) 0.5 MG/2ML nebulizer solution Take 2 mLs (0.5 mg total) by nebulization 2 (two) times daily. DX:  496 02/07/11 03/21/11  Parrett, Fonnie Mu, NP  esomeprazole (NEXIUM) 40 MG capsule Take 1 capsule (40 mg total) by mouth daily. 12/28/10 03/21/11  Elsie Stain, MD   No Known Allergies Review of Systems Does not participate in [place]  Physical Exam  General: Alert, awake, in no acute distress. Irritable. Frail appearing. Heart: Regular rate and rhythm. No murmur appreciated. Lungs: Fair air movement Abdomen: Soft, nontender, nondistended, positive bowel sounds.  Ext: No significant edema Skin: Warm and dry Neuro: Grossly intact, nonfocal.  Vital Signs: BP (!) 155/72 (BP Location: Right Arm)   Pulse 95   Temp 99.3 F (37.4 C) (Rectal)   Resp (!) 21   Ht 5' 4"  (1.626 m) Comment: per patient  Wt 61.4 kg   SpO2 100%   BMI 23.23 kg/m  Pain Scale: 0-10   Pain Score: 9    SpO2: SpO2: 100 % O2 Device:SpO2: 100 % O2 Flow Rate: .O2 Flow Rate (L/min): 33 L/min  IO: Intake/output summary:   Intake/Output Summary (Last 24 hours) at 08/20/2018 2205 Last data filed at 08/20/2018 2035 Gross per 24 hour  Intake 946.61 ml  Output 2075 ml  Net -1128.39 ml    LBM: Last BM Date: 08/20/18 Baseline Weight: Weight: 61.4 kg Most recent weight: Weight: 61.4 kg     Palliative Assessment/Data:     Time In: 1600  Time Out: 1700 Time Total: 60 Greater than 50%  of this time was spent counseling and coordinating care related to the above assessment and plan.  Signed by: Micheline Rough, MD   Please contact Palliative Medicine Team phone at [phone] for questions and concerns.  For individual provider: See Shea Evans

## 2018-08-20 NOTE — Progress Notes (Signed)
Late entry for 08/20/2018 at 0700 hrs.  Called by RN that pt was tachypneic with increased WOB. CXR had been ordered. No hypoxia and mental status was still fairly clear. Stopped by to talk to RN and see pt. He is coughing up clear sputum. + use of accessory muscles. SaO2 is normal.  NP agrees with CXR. Pt may need Lasix. Extra Hydralazine given for hypertension. Told RN to apply NRB. Report given to oncoming attending about present circumstances. Care passed on to Dr. Tyrell Antonio.  KJKG, NP [facility]

## 2018-08-21 ENCOUNTER — Inpatient Hospital Stay (HOSPITAL_COMMUNITY): Payer: Medicare HMO

## 2018-08-21 LAB — CBC
HCT: 25.9 % — ABNORMAL LOW (ref 39.0–52.0)
Hemoglobin: 8.5 g/dL — ABNORMAL LOW (ref 13.0–17.0)
MCH: 26.3 pg (ref 26.0–34.0)
MCHC: 32.8 g/dL (ref 30.0–36.0)
MCV: 80.2 fL (ref 80.0–100.0)
Platelets: 150 10*3/uL (ref 150–400)
RBC: 3.23 MIL/uL — ABNORMAL LOW (ref 4.22–5.81)
RDW: 18 % — ABNORMAL HIGH (ref 11.5–15.5)
WBC: 16.5 10*3/uL — ABNORMAL HIGH (ref 4.0–10.5)
nRBC: 0.1 % (ref 0.0–0.2)

## 2018-08-21 LAB — COMPREHENSIVE METABOLIC PANEL
ALT: 89 U/L — ABNORMAL HIGH (ref 0–44)
AST: 218 U/L — ABNORMAL HIGH (ref 15–41)
Albumin: 1.9 g/dL — ABNORMAL LOW (ref 3.5–5.0)
Alkaline Phosphatase: 467 U/L — ABNORMAL HIGH (ref 38–126)
Anion gap: 18 — ABNORMAL HIGH (ref 5–15)
BUN: 46 mg/dL — ABNORMAL HIGH (ref 8–23)
CO2: 15 mmol/L — ABNORMAL LOW (ref 22–32)
Calcium: 7.7 mg/dL — ABNORMAL LOW (ref 8.9–10.3)
Chloride: 105 mmol/L (ref 98–111)
Creatinine, Ser: 1.63 mg/dL — ABNORMAL HIGH (ref 0.61–1.24)
GFR calc Af Amer: 48 mL/min — ABNORMAL LOW (ref >60)
GFR calc non Af Amer: 41 mL/min — ABNORMAL LOW (ref >60)
Glucose, Bld: 141 mg/dL — ABNORMAL HIGH (ref 70–99)
Potassium: 4.7 mmol/L (ref 3.5–5.1)
Sodium: 138 mmol/L (ref 135–145)
Total Bilirubin: 4 mg/dL — ABNORMAL HIGH (ref 0.3–1.2)
Total Protein: 5.4 g/dL — ABNORMAL LOW (ref 6.5–8.1)

## 2018-08-21 LAB — GLUCOSE, CAPILLARY
Glucose-Capillary: 122 mg/dL — ABNORMAL HIGH (ref 70–99)
Glucose-Capillary: 153 mg/dL — ABNORMAL HIGH (ref 70–99)
Glucose-Capillary: 148 mg/dL — ABNORMAL HIGH (ref 70–99)
Glucose-Capillary: 203 mg/dL — ABNORMAL HIGH (ref 70–99)

## 2018-08-21 LAB — GLUCOSE, PLEURAL OR PERITONEAL FLUID: Glucose, Fluid: 144 mg/dL

## 2018-08-21 LAB — ALBUMIN, PLEURAL OR PERITONEAL FLUID: Albumin, Fluid: 1.2 g/dL

## 2018-08-21 LAB — BODY FLUID CELL COUNT WITH DIFFERENTIAL
Lymphs, Fluid: 3 %
Monocyte-Macrophage-Serous Fluid: 26 % — ABNORMAL LOW (ref 50–90)
Neutrophil Count, Fluid: 71 % — ABNORMAL HIGH (ref 0–25)
Total Nucleated Cell Count, Fluid: 2300 cu mm — ABNORMAL HIGH (ref 0–1000)

## 2018-08-21 MED ORDER — LIDOCAINE HCL 1 % IJ SOLN
INTRAMUSCULAR | Status: AC
  Filled 2018-08-21: qty 20

## 2018-08-21 MED ORDER — HYDROMORPHONE HCL 1 MG/ML IJ SOLN
0.5000 mg | INTRAMUSCULAR | Status: DC | PRN
  Administered 2018-08-21 – 2018-08-22 (×3): 1 mg via INTRAVENOUS
  Filled 2018-08-21 (×4): qty 1

## 2018-08-21 MED ORDER — SODIUM BICARBONATE-DEXTROSE 150-5 MEQ/L-% IV SOLN: 150.0000 meq | INTRAVENOUS | Status: DC

## 2018-08-21 MED ORDER — METRONIDAZOLE IN NACL 5-0.79 MG/ML-% IV SOLN
500.0000 mg | Freq: Three times a day (TID) | INTRAVENOUS | Status: DC
  Administered 2018-08-21 – 2018-08-24 (×9): 500 mg via INTRAVENOUS
  Filled 2018-08-21 (×9): qty 100

## 2018-08-21 MED ORDER — FLUCONAZOLE 100 MG PO TABS
100.0000 mg | ORAL_TABLET | Freq: Every day | ORAL | Status: DC
  Administered 2018-08-21 – 2018-08-23 (×3): 100 mg via ORAL
  Filled 2018-08-21 (×4): qty 1

## 2018-08-21 MED ORDER — ONDANSETRON HCL 4 MG/2ML IJ SOLN
4.0000 mg | Freq: Four times a day (QID) | INTRAMUSCULAR | Status: DC | PRN
  Administered 2018-08-21 – 2018-08-23 (×6): 4 mg via INTRAVENOUS
  Filled 2018-08-21 (×6): qty 2

## 2018-08-21 MED ORDER — SODIUM BICARBONATE-DEXTROSE 150-5 MEQ/L-% IV SOLN
150.0000 meq | INTRAVENOUS | Status: DC
  Administered 2018-08-21 – 2018-08-23 (×3): 150 meq via INTRAVENOUS
  Filled 2018-08-21 (×9): qty 1000

## 2018-08-21 MED ORDER — NYSTATIN 100000 UNIT/ML MT SUSP
5.0000 mL | Freq: Four times a day (QID) | OROMUCOSAL | Status: DC
  Administered 2018-08-21 – 2018-08-24 (×11): 500000 [IU] via ORAL
  Filled 2018-08-21 (×11): qty 5

## 2018-08-21 NOTE — Procedures (Signed)
PROCEDURE SUMMARY:  Successful US guided paracentesis from right lower abdomen.  Yielded 450 mL of blood.  No immediate complications.  Patient tolerated well.  EBL = trace  Specimen was sent for labs.  Verdis Bassette S Elliannah Wayment PA-C 08/21/2018 10:21 AM

## 2018-08-21 NOTE — Progress Notes (Addendum)
[facility] for Infectious Disease    Date of Admission:  09/02/2018   Total days of antibiotics 3  ID: Pilar Lukacs is a 73 y.o. male with  Metastatic bladder cancer and worsening liver metastasis Active Problems:   Abdominal pain    Subjective: Afebrile, but complains of still significant abdominal discomfort  Underwent paracentesis with 467mL of bloody fluid removed. Cell count suggests bacterial perotinitis  Labs reveal AKI  Medications:  . amLODipine  10 mg Oral Daily  . aspirin EC  81 mg Oral Daily  . carvedilol  3.125 mg Oral BID  . Chlorhexidine Gluconate Cloth  6 each Topical Daily  . fluconazole  100 mg Oral Daily  . insulin aspart  0-5 Units Subcutaneous QHS  . insulin aspart  0-9 Units Subcutaneous TID WC  . ipratropium-albuterol  3 mL Nebulization Q6H  . latanoprost  1 drop Both Eyes QHS  . lidocaine      . mouth rinse  15 mL Mouth Rinse BID  . mometasone-formoterol  2 puff Inhalation BID  . nystatin  5 mL Oral QID  . pantoprazole  40 mg Oral Q0600  . senna-docusate  1 tablet Oral BID  . tamsulosin  0.4 mg Oral Daily  . umeclidinium bromide  1 puff Inhalation Daily  . [START ON 08/25/2018] Vitamin D (Ergocalciferol)  50,000 Units Oral Weekly    Objective: Vital signs in last 24 hours: Temp:  [98.6 F (37 C)-99.5 F (37.5 C)] 98.8 F (37.1 C) (08/13 0900) Pulse Rate:  [79-97] 89 (08/13 1400) Resp:  [17-28] 18 (08/13 1400) BP: (119-163)/(47-72) 156/61 (08/13 1400) SpO2:  [97 %-100 %] 98 % (08/13 1400) FiO2 (%):  [100 %] 100 % (08/13 0801) Physical Exam  Constitutional: He is oriented to person, place, and time. He appears well-developed and well-nourished. No distress.  HENT: +mild scleral icterus Mouth/Throat: Oropharynx is clear and moist. No oropharyngeal exudate.  Cardiovascular: Normal rate, regular rhythm and normal heart sounds. Exam reveals no gallop and no friction rub.  No murmur heard.  Pulmonary/Chest: Effort normal and breath sounds  normal. No respiratory distress. He has no wheezes.  Abdominal: Soft. Bowel sounds are decrease. Diffuse tenderness Lymphadenopathy:  He has no cervical adenopathy.  Neurological: He is alert and oriented to person, place, and time.  Skin: Skin is warm and dry. No rash noted. No erythema.  Psychiatric: He has a normal mood and affect. His behavior is normal.    Lab Results Recent Labs    08/20/18 0509 08/21/18 0741  WBC 14.3* 16.5*  HGB 9.2* 8.5*  HCT 28.8* 25.9*  NA 136 138  K 3.9 4.7  CL 106 105  CO2 23 15*  BUN 21 46*  CREATININE 1.21 1.63*   Liver Panel Recent Labs    08/20/18 0509 08/21/18 0741  PROT 5.5* 5.4*  ALBUMIN 2.1* 1.9*  AST 148* 218*  ALT 71* 89*  ALKPHOS 586* 467*  BILITOT 3.7* 4.0*  BILIDIR 2.4*  --     Microbiology: reviewed Studies/Results: Dg Chest 1 View  Result Date: 08/20/2018 CLINICAL DATA:  Shortness of breath. Infection of larynx, trachea bronchi or lungs. EXAM: CHEST  1 VIEW COMPARISON:  Radiographs of August 19, 2018. CT scan of July 24th 2020. FINDINGS: Stable cardiac silhouette. Stable appearance of right upper lobe mass with right paratracheal prominence consistent with adenopathy. No pneumothorax or pleural effusion is noted. No consolidative process is noted. Bony thorax is unremarkable. IMPRESSION: Stable appearance of right upper lobe  mass with right paratracheal adenopathy. No significant change compared to prior exam. Electronically Signed   By: Marijo Conception M.D.   On: 08/20/2018 08:20   Ct Abdomen Pelvis W Contrast  Result Date: 08/29/2018 CLINICAL DATA:  Abdominal pain, known malignancy, recurrence neoplasm of the bladder EXAM: CT ABDOMEN AND PELVIS WITH CONTRAST TECHNIQUE: Multidetector CT imaging of the abdomen and pelvis was performed using the standard protocol following bolus administration of intravenous contrast. CONTRAST:  173mL OMNIPAQUE IOHEXOL 300 MG/ML  SOLN COMPARISON:  August 01, 2018 FINDINGS: Lower chest: There is  mild cardiomegaly. There is streaky atelectasis or scarring at the right lung base. No hiatal hernia. The visualized portions of the lungs are clear. Hepatobiliary: Multiple peripherally enhancing hypodense liver masses are seen throughout the liver parenchyma they have increased in size and number from the prior exam. For reference in the inferior right liver lobe the mass measured 1.2 cm on series 2, image 72, and now measures 3.1 cm seen on series 2, image 44. The main portal vein is patent. There is a moderate amount of perihepatic ascites. No evidence of calcified gallstones, gallbladder wall thickening or biliary dilatation. Pancreas: Unremarkable. No pancreatic ductal dilatation or surrounding inflammatory changes. Spleen: Normal in size without focal abnormality. Adrenals/Urinary Tract: Both adrenal glands appear normal. Again noted is a left-sided double-J ureteral stent. There is moderate right hydronephrosis which is slightly decreased from the prior exam. There is bladder wall thickening, predominantly within the right bladder wall and bladder dome which appears unchanged from prior. Stomach/Bowel: The stomach, small bowel, and colon are normal in appearance. No inflammatory changes, wall thickening, or obstructive findings. Vascular/Lymphatic: There are no enlarged mesenteric, retroperitoneal, or pelvic lymph nodes. Scattered aortic atherosclerotic calcifications are seen without aneurysmal dilatation. Reproductive: The prostate is unremarkable. Other: No evidence of abdominal wall mass or hernia. Musculoskeletal: No acute or significant osseous findings. IMPRESSION: 1. Numerous hypodense liver masses seen throughout which appear larger and more numerous in size from the prior exam August 01, 2018. This is concerning for infectious liver abscesses. 2. New small amount perihepatic and abdominal ascites. 3. Right-sided double-J ureteral stent with mild hydronephrosis 4. Bladder wall thickening/soft tissue  nodule as on the prior exam. Electronically Signed   By: Prudencio Pair M.D.   On: 08/23/2018 18:15   US Paracentesis  Result Date: 08/21/2018 INDICATION: Bladder cancer with metastatic disease to the liver. Ascites. Request for diagnostic and therapeutic paracentesis. EXAM: ULTRASOUND GUIDED PARACENTESIS MEDICATIONS: 1% lidocaine is mL COMPLICATIONS: None immediate. PROCEDURE: Informed written consent was obtained from the patient after a discussion of the risks, benefits and alternatives to treatment. A timeout was performed prior to the initiation of the procedure. Initial ultrasound scanning demonstrates a small amount of ascites within the right lower abdominal quadrant. The right lower abdomen was prepped and draped in the usual sterile fashion. 1% lidocaine with epinephrine was used for local anesthesia. Following this, a 19 gauge, 7-cm, Yueh catheter was introduced. An ultrasound image was saved for documentation purposes. The paracentesis was performed. The catheter was removed and a dressing was applied. The patient tolerated the procedure well without immediate post procedural complication. FINDINGS: A total of approximately 450 mL of blood was removed. Samples were sent to the laboratory as requested by the clinical team. IMPRESSION: Successful ultrasound-guided paracentesis yielding 450 mL of blood. Read by: Gareth Eagle, PA-C Electronically Signed   By: Jacqulynn Cadet M.D.   On: 08/21/2018 10:20   Vas [place] Lower Extremity Venous (  dvt)  Result Date: 08/21/2018  Lower Venous Study Indications: Pain, and Edema.  Risk Factors: Cancer Bladder with metastasis to the hepatic system Respiratory failure. Comparison Study: No previous study aailable for comparison Performing Technologist: [employer] [facility]  Examination Guidelines: A complete evaluation includes B-mode imaging, spectral Doppler, color Doppler, and power Doppler as needed of all accessible portions of each vessel. Bilateral testing is  considered an integral part of a complete examination. Limited examinations for reoccurring indications may be performed as noted.  +---------+---------------+---------+-----------+----------+-------+ RIGHT    CompressibilityPhasicitySpontaneityPropertiesSummary +---------+---------------+---------+-----------+----------+-------+ CFV      Full           No       Yes                          +---------+---------------+---------+-----------+----------+-------+ SFJ      Full           No                                    +---------+---------------+---------+-----------+----------+-------+ FV Prox  Full           No       Yes                          +---------+---------------+---------+-----------+----------+-------+ FV Mid   Full           No       Yes                          +---------+---------------+---------+-----------+----------+-------+ FV DistalFull           Yes      Yes                          +---------+---------------+---------+-----------+----------+-------+ PFV      Full           No                                    +---------+---------------+---------+-----------+----------+-------+ POP      Full           No                                    +---------+---------------+---------+-----------+----------+-------+ PTV      Full                                                 +---------+---------------+---------+-----------+----------+-------+ PERO     Full                                                 +---------+---------------+---------+-----------+----------+-------+   Right Technical Findings: Unable to visualize the iliac due to abdominal distention. Venous flow pattern is abnormal throughout consistent with a more proximal obstruction  +---------+---------------+---------+-----------+----------+-------+ LEFT     CompressibilityPhasicitySpontaneityPropertiesSummary  +---------+---------------+---------+-----------+----------+-------+ CFV  Full           Yes      Yes                          +---------+---------------+---------+-----------+----------+-------+ SFJ      Full                                                 +---------+---------------+---------+-----------+----------+-------+ FV Prox  Full           Yes      Yes                          +---------+---------------+---------+-----------+----------+-------+ FV Mid   Full                                                 +---------+---------------+---------+-----------+----------+-------+ FV DistalFull           Yes      Yes                          +---------+---------------+---------+-----------+----------+-------+ PFV      Full           Yes      Yes                          +---------+---------------+---------+-----------+----------+-------+ POP      Full           Yes      Yes                          +---------+---------------+---------+-----------+----------+-------+ PTV      Full                                                 +---------+---------------+---------+-----------+----------+-------+ PERO     Full                                                 +---------+---------------+---------+-----------+----------+-------+     Summary: Right: There is no evidence of deep vein thrombosis in the lower extremity. No cystic structure found in the popliteal fossa. See technical findings listed above Left: There is no evidence of deep vein thrombosis in the lower extremity. No cystic structure found in the popliteal fossa.  *See table(s) above for measurements and observations. Electronically signed by Curt Jews MD on 08/21/2018 at 7:16:49 AM.    Final      Assessment/Plan: Fever in immunocompromised host/ bladder ca with worsening hepatic metastatic lesions +/- hepatic abscess and concern for secondary bacterial peritonitis. Afebrile, but  leukocytosis  is worsening. - will send ascites fluid for cytology - will recommend to add metronidazole  - continue to follow CMP daily to see if transaminitis is worsening. - metastatic bladder ca - defer to dr [name]  for further treatment at this time  aki = would watch for any worsening to decide need for dose adjustment on abtx.     [facility] for Infectious Diseases Cell: [phone] Pager: [phone]  08/21/2018, 5:33 PM

## 2018-08-21 NOTE — Progress Notes (Signed)
Daily Progress Note   Patient Name: [name]       Date: 08/21/2018 DOB: 11/23/1945  Age: 73 y.o. MRN#: [id number] Attending Physician: Elmarie Shiley, MD Primary Care Physician: [name], MD Admit Date: 08/23/2018  Reason for Consultation/Follow-up: Establishing goals of care  Subjective: I saw and examined Mr. Cleland this evening.  His daughter was also present in the room.  We discussed clinical course including concern for bacterial peritonitis.   He reports having continued pain despite medications.  Reports pain is predominantly in abdomen, intermittently worsens but no identifiable exacerbating factor, some relief with pain medication but not sufficient relief.  Endorses having nausea earlier but currently this is doing better.  Length of Stay: 2  Current Medications: Scheduled Meds:  . amLODipine  10 mg Oral Daily  . aspirin EC  81 mg Oral Daily  . carvedilol  3.125 mg Oral BID  . Chlorhexidine Gluconate Cloth  6 each Topical Daily  . fluconazole  100 mg Oral Daily  . insulin aspart  0-5 Units Subcutaneous QHS  . insulin aspart  0-9 Units Subcutaneous TID WC  . ipratropium-albuterol  3 mL Nebulization Q6H  . latanoprost  1 drop Both Eyes QHS  . lidocaine      . mouth rinse  15 mL Mouth Rinse BID  . mometasone-formoterol  2 puff Inhalation BID  . nystatin  5 mL Oral QID  . pantoprazole  40 mg Oral Q0600  . senna-docusate  1 tablet Oral BID  . tamsulosin  0.4 mg Oral Daily  . umeclidinium bromide  1 puff Inhalation Daily  . [START ON 08/25/2018] Vitamin D (Ergocalciferol)  50,000 Units Oral Weekly    Continuous Infusions: . ceFEPime (MAXIPIME) IV Stopped (08/21/18 0941)  . metronidazole 500 mg (08/21/18 1824)  . sodium bicarbonate 150 mEq in dextrose 5% 1000 mL       PRN Meds: albuterol, HYDROmorphone (DILAUDID) injection, ondansetron (ZOFRAN) IV  Physical Exam    General: Alert, awake, in some distress from pain.  Frail appearing. Heart: Regular rate and rhythm. No murmur appreciated. Lungs: Fair air movement Abdomen: Soft, diffuse tender, nondistended, positive bowel sounds.  Ext: No significant edema Skin: Warm and dry Neuro: Grossly intact, nonfocal.       Vital Signs: BP (!) 154/72   Pulse 85  Temp 99.3 F (37.4 C)   Resp 19   Ht 5\' 4"  (1.626 m) Comment: per patient  Wt 61.4 kg   SpO2 95%   BMI 23.23 kg/m  SpO2: SpO2: 95 % O2 Device: O2 Device: Nasal Cannula O2 Flow Rate: O2 Flow Rate (L/min): 2 L/min  Intake/output summary:   Intake/Output Summary (Last 24 hours) at 08/21/2018 1834 Last data filed at 08/21/2018 1749 Gross per 24 hour  Intake -  Output 800 ml  Net -800 ml   LBM: Last BM Date: 08/20/18 Baseline Weight: Weight: 61.4 kg Most recent weight: Weight: 61.4 kg       Palliative Assessment/Data:      Patient Active Problem List   Diagnosis Date Noted  . Abdominal pain 08/10/2018  . Acute cystitis with hematuria   . Metastatic disease ([place])   . Sepsis due to gram-negative UTI ([place]) 08/01/2018  . Liver mass, right lobe 08/01/2018  . Bladder cancer ([place]) 06/06/2018  . Pulmonary nodules 06/04/2018  . COPD GOLD II  06/03/2018  . Preop cardiovascular exam 04/27/2018  . 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 Hematuria 08/03/2016  . Dysuria 03/01/2015  . Type 2 diabetes mellitus with circulatory disorder ([place])   . PVC's (premature ventricular contractions)   . CAD S/P BMS PCI - pRCA, mRCA, rPAV 06/30/2012  . History of ST elevation myocardial infarction (STEMI) of inferior wall ([place]) 02/17/2012  . Hyperlipidemia associated with type 2 diabetes mellitus ([place]) 02/17/2012  . Essential hypertension 02/17/2012  . Tobacco abuse, has stopped 02/17/2012  . Allergic rhinitis  02/17/2012  . COPD with exacerbation ([place]) 05/31/2009  . OBSTRUCTIVE CHRONIC BRONCHITIS 11/13/2006  . GERD 11/13/2006    Palliative Care Assessment & Plan   Patient Profile: 73 y.o. 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  Palliative consulted for GOC   Assessment: Patient Active Problem List   Diagnosis Date Noted  . Abdominal pain 08/12/2018  . Acute cystitis with hematuria   . Metastatic disease ([place])   . Sepsis due to gram-negative UTI ([place]) 08/01/2018  . Liver mass, right lobe 08/01/2018  . Bladder cancer ([place]) 06/06/2018  . Pulmonary nodules 06/04/2018  . COPD GOLD II  06/03/2018  . Preop cardiovascular exam 04/27/2018  . 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 Hematuria 08/03/2016  . Dysuria 03/01/2015  . Type 2 diabetes mellitus with circulatory disorder ([place])   . PVC's (premature ventricular contractions)   . CAD S/P BMS PCI - pRCA, mRCA, rPAV 06/30/2012  . History of ST elevation myocardial infarction (STEMI) of inferior wall ([place]) 02/17/2012  . Hyperlipidemia associated with type 2 diabetes mellitus ([place]) 02/17/2012  . Essential hypertension 02/17/2012  . Tobacco abuse, has stopped 02/17/2012  . Allergic rhinitis 02/17/2012  . COPD with exacerbation ([employer]) 05/31/2009  . OBSTRUCTIVE CHRONIC BRONCHITIS 11/13/2006  . GERD 11/13/2006   Recommendations/Plan:  Pain: reports insufficient relief with current medication.  Chart reviewed and he has been getting morphine prn.  With this renal function, would be preferable to transition away from morphine.  Addition of dilaudid 0.5-1mg  every 4 hours as needed  Nausea: reports nausea earlier today.  Zofran as needed.  DNR/DNI  Discussed clinical course and concern for bacterial peritonitis with patient and his daughter.  Reviewed care plan including changing of abx regimen today.  Family has  noted that they understand  need for goals of care discussions but feel that they need to see how he responds to current interventions before making any decisions.  We discussed plan to follow his clinical course for another 48 hours and reassess.  If he continues to worsen from acute infection, this will drive decision making differently than if his infection improves and goals will be driven more by progression and plan for treatment of his underlying cancer.  PMT to continue to follow.  Code Status:    Code Status Orders  (From admission, onward)         Start     Ordered   08/20/18 0821  Do not attempt resuscitation (DNR)  Continuous    Question Answer Comment  In the event of cardiac or respiratory ARREST Do not call a "code blue"   In the event of cardiac or respiratory ARREST Do not perform Intubation, CPR, defibrillation or ACLS   In the event of cardiac or respiratory ARREST Use medication by any route, position, wound care, and other measures to relive pain and suffering. May use oxygen, suction and manual treatment of airway obstruction as needed for comfort.      08/20/18 0820        Code Status History    Date Active Date Inactive Code Status Order ID Comments User Context   08/23/2018 2126 08/20/2018 0820 Full Code [id number]  Bonnell Public, MD ED   08/01/2018 2002 08/06/2018 1944 Full Code [id number]  Barb Merino, MD Inpatient   07/27/2017 0227 07/28/2017 1718 Full Code [id number]  Rise Patience, MD Inpatient   08/03/2016 1120 08/08/2016 1826 Full Code [id number]  Rondel Jumbo, PA-C ED   06/01/2015 1440 06/05/2015 1633 Full Code [id number]  Willia Craze, NP ED   12/27/2014 0924 12/27/2014 2029 Full Code [id number]  Jettie Booze, MD Inpatient   12/23/2014 1744 12/27/2014 0924 Full Code [id number]  Thurnell Lose, MD Inpatient   05/01/2014 0243 05/06/2014 1235 Full Code [id number]  Theressa Millard, MD Inpatient   08/26/2013 1837 08/31/2013 1954 Full Code  [id number]  Donne Hazel, MD ED   08/03/2013 1620 08/04/2013 1834 Full Code [id number]  [name], Peter M, MD Inpatient   08/01/2013 1652 08/03/2013 1608 Full Code [id number]  [name], Peter M, MD Inpatient   12/20/2010 1326 12/27/2010 1835 Full Code [id number]  Cheryln Manly, RN ED   Advance Care Planning Activity       Prognosis:   Unable to determine  Discharge Planning:  To Be Determined  Care plan was discussed with patient, daughter  Thank you for allowing the Palliative Medicine Team to assist in the care of this patient.   Time In: 1800 Time Out: 1845 Total Time 45 Prolonged Time Billed No      Greater than 50%  of this time was spent counseling and coordinating care related to the above assessment and plan.  Micheline Rough, MD  Please contact Palliative Medicine Team phone at [phone] for questions and concerns.

## 2018-08-21 NOTE — Progress Notes (Signed)
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  Patient presents to the ED with abdominal pain, fever and shortness of breath.  He was found to have a hemoglobin at 6 down from 10.  UA with large leukocytes more than 50 white blood cell.  Increase liver function test.  CT abdomen and pelvis show numerous hypodense liver masses which appear larger and more numerous in size from prior exam in July, concerning with liver abscess.  Mild ascites.  Right side double-J ureteral stent with mild hydronephrosis.  Patient was admitted to the stepdown unit, started on IV cefepime.  Overnight he became more tachypneic and hypoxic requiring nonrebreather mask.  patient continues to be tachypneic, using accessory muscles to breath.  Chest x-ray ordered stat, BiPAP order, IV Lasix and blood gas ordered. ID, CCM oncology consulted.  ID is recommending paracentesis and continuation of antibiotics.   Assessment & Plan:   Active Problems:   Abdominal pain  1-Acute hypoxic respiratory failure;  Patient develops worsening hypoxemia, hypercapnia overnight 8/12.  Chest x ray stable right upper lobe mass.  BIPAP PRN. He is off BIPAP this am. 2 L oxygen, mild tachypnea.  CCM consulted. Appreciate assistance. Doppler LE negative Patient is now DNR, DNI.  Continue with nebulizer.  Hold on lasix.   2-Bladder cancer, metastatic to liver and lung; transaminases.  Discussed with Dr [name] patient is poor prognosis, he  will follow in consultation.  Prior Biopsy showed; ischemic necrosis neoplasm.  Ammonia nl.    3-Sepsis;  Patient presents with fever, leukocytosis;  Follow urine culture, bloo culture.  ? Superimpose infection on  liver metaststatic diseases. Continue with cefepime. ID consulted. Paracentesis ordered.  Afebrile this am.  Continue with cefepime.   4-Abdominal pain; related to liver mets.  PRN morphine.   5-HTN;  Continue with coreg, norvasc, PRN hydralazine.   6-Acute blood loss anemia;  S/P transfusion 2 units PRBC. Monitor for sign of bleeding.  Hb decrease to 8.5 Follow trend. Monitor for bleeding.   7-COPD exacerbation Schedule nebulizer.   8-oral thrush: Start nystatin and fluconazole.   Estimated body mass index is 23.23 kg/m as calculated from the following:   Height as of this encounter: 5\' 4"  (1.626 m).   Weight as of this encounter: 61.4 kg.   DVT prophylaxis: scd Code Status: Discussed with Daughter who were at bedside, they agree with DNR status. Plan for palliative consult for goal of care.  Family Communication: no family at bedside Disposition Plan: remain in the step down Consultants:   CCM  Oncology   Procedures:   none  Antimicrobials:  cefepime  Subjective: He appears less anxious, less tachypnea.  He relates nebulizer help with breathing.  Complaining of nausea and abdominal pain.  Would like something to drink.   Objective: Vitals:   08/21/18 0250 08/21/18 0300 08/21/18 0350 08/21/18 0400  BP:  (!) 152/65  (!) 144/62  Pulse: 93 97 87 87  Resp: (!) 23 (!) 21 (!) 24 (!) 23  Temp: 99 F (37.2 C) 98.6  F (37 C) 98.6 F (37 C) 98.6 F (37 C)  TempSrc:  Rectal  Rectal  SpO2: 98% 100% 98% 99%  Weight:      Height:        Intake/Output Summary (Last 24 hours) at 08/21/2018 0810 Last data filed at 08/20/2018 2035 Gross per 24 hour  Intake 219.11 ml  Output 1200 ml  Net -980.89 ml   Filed Weights   08/22/2018 2150  Weight:  61.4 kg    Examination:  General exam: more calm  Respiratory system: bilateral air movement, no ronchus Cardiovascular system: S 1, S 2 RRR Gastrointestinal system: BS present, soft, distended, tender  Central nervous system: alert, oriented.  Extremities: symmetric power Skin: no rashes or ulcer Psychiatry: mood and affect appropriate  Data Reviewed: I have personally reviewed following labs and imaging studies  CBC: Recent Labs  Lab 08/20/2018 1449 08/28/2018 1925 08/20/18 0509 08/21/18 0741  WBC 11.5* 12.6* 14.3* 16.5*  NEUTROABS 9.7*  --   --   --   HGB 6.0* 5.9* 9.2* 8.5*  HCT 19.4* 19.5* 28.8* 25.9*  MCV 78.2* 79.6* 83.5 80.2  PLT 163 158 136* 778   Basic Metabolic Panel: Recent Labs  Lab 08/29/2018 1449 08/20/18 0509  NA 135 136  K 3.4* 3.9  CL 103 106  CO2 22 23  GLUCOSE 170* 112*  BUN 24* 21  CREATININE 1.28* 1.21  CALCIUM 7.6* 7.3*  MG  --  1.7  PHOS  --  3.2   GFR: Estimated Creatinine Clearance: 45.5 mL/min (by C-G formula based on SCr of 1.21 mg/dL). Liver Function Tests: Recent Labs  Lab 08/10/2018 1449 08/20/18 0509  AST 129* 148*  ALT 67* 71*  ALKPHOS 642* 586*  BILITOT 2.5* 3.7*  PROT 5.6* 5.5*  ALBUMIN 2.1* 2.1*   Recent Labs  Lab 08/18/2018 1449  LIPASE 16   Recent Labs  Lab 08/20/18 0800  AMMONIA 29   Coagulation Profile: Recent Labs  Lab 08/15/2018 1925  INR 1.4*   Cardiac Enzymes: No results for input(s): CKTOTAL, CKMB, CKMBINDEX, TROPONINI in the last 168 hours. BNP (last 3 results) No results for input(s): PROBNP in the last 8760 hours. HbA1C: No results for input(s): HGBA1C in the last 72 hours. CBG: Recent Labs  Lab 08/20/18 0730 08/20/18 1142 08/20/18 1546 08/20/18 2152 08/21/18 0802  GLUCAP 97 91 111* 129* 122*   Lipid Profile: No results for input(s): CHOL, HDL, LDLCALC, TRIG, CHOLHDL, LDLDIRECT in the last 72 hours. Thyroid Function Tests: No results for input(s): TSH, T4TOTAL, FREET4, T3FREE, THYROIDAB  in the last 72 hours. Anemia Panel: No results for input(s): VITAMINB12, FOLATE, FERRITIN, TIBC, IRON, RETICCTPCT in the last 72 hours. Sepsis Labs: Recent Labs  Lab 09/07/2018 1454 08/20/18 0800 08/20/18 1026  LATICACIDVEN 1.7 1.6 1.4    Recent Results (from the past 240 hour(s))  Blood culture (routine x 2)     Status: None (Preliminary result)   Collection Time: 08/12/2018  3:22 PM   Specimen: BLOOD  Result Value Ref Range Status   Specimen Description   Final    BLOOD LEFT ANTECUBITAL Performed at [facility], [place] [address], [place], [place] [address]    Special Requests   Final    BOTTLES DRAWN AEROBIC AND ANAEROBIC Blood Culture adequate volume Performed at [place] [address], [place], [place] [address]    Culture   Final    NO GROWTH < 24 HOURS Performed at [facility], [address], [place], [place] [address]    Report Status PENDING  Incomplete  Blood culture (routine x 2)     Status: None (Preliminary result)   Collection Time: 08/10/2018  3:22 PM   Specimen: BLOOD RIGHT HAND  Result Value Ref Range Status   Specimen Description   Final    BLOOD RIGHT HAND Performed at [place] [address] [place], [place] [address]    Special Requests   Final    BOTTLES DRAWN AEROBIC AND ANAEROBIC Blood Culture adequate volume Performed at [place] [address] [place], [place] [address]    Culture   Final    NO GROWTH < 24 HOURS Performed at [place] [address] [place], [place] [address]    Report Status PENDING  Incomplete  Urine Culture     Status: Abnormal   Collection Time: 08/23/2018  3:23 PM   Specimen: Urine, Clean Catch  Result Value Ref Range Status   Specimen Description   Final    URINE, CLEAN CATCH Performed at [facility], [place] [address] [place], [place] [address]    Special Requests   Final    NONE Performed at  [facility], [place] [address] [place], [place] [address]    Culture (A)  Final    <10,000 COLONIES/mL INSIGNIFICANT GROWTH Performed at [place] [address], [place], [place] [address]    Report Status 08/20/2018 FINAL  Final  SARS CORONAVIRUS 2 Nasal Swab Aptima Multi Swab     Status: None   Collection Time: 09/07/2018  7:37 PM   Specimen: Aptima Multi Swab; Nasal Swab  Result Value Ref Range Status   SARS Coronavirus 2 NEGATIVE NEGATIVE Final    Comment: (NOTE) SARS-CoV-2 target nucleic acids are NOT DETECTED. The SARS-CoV-2 RNA is generally detectable in upper and lower respiratory specimens during the acute phase of infection. Negative results do not preclude SARS-CoV-2 infection, do not rule out co-infections with other pathogens, and should not be used as the sole basis for treatment or other patient management decisions. Negative results must be combined with clinical observations, patient history, and epidemiological information. The expected result is Negative. Fact Sheet for Patients: SugarRoll.be Fact Sheet for Healthcare Providers: https://www.woods-mathews.com/ This test is not yet approved or cleared by the [place] FDA and  has been authorized for detection and/or diagnosis of SARS-CoV-2 by FDA under an Emergency Use Authorization (EUA). This EUA will remain  in effect (meaning this test can be used) for the duration of the COVID-19 declaration under Section 56 4(b)(1) of the Act, 21 U.S.C. section 360bbb-3(b)(1), unless the authorization is terminated or revoked sooner. Performed at [facility], [place] [address], [place], [place] [address]   MRSA PCR Screening     Status: None   Collection Time: 08/11/2018 10:42 PM   Specimen: Nasal Mucosa; Nasopharyngeal  Result Value Ref Range Status   MRSA by PCR NEGATIVE NEGATIVE Final    Comment:        The GeneXpert MRSA Assay (FDA approved  for NASAL specimens only), is one component of a comprehensive MRSA colonization surveillance program. It is not intended to diagnose MRSA infection nor to guide or monitor treatment for MRSA infections. Performed at [facility], [place] [address] [place], [place] [address]   Culture, sputum-assessment     Status: None   Collection Time: 08/20/18  7:47 AM  Specimen: Sputum  Result Value Ref Range Status   Specimen Description SPUTUM  Final   Special Requests NONE  Final   Sputum evaluation   Final    THIS SPECIMEN IS ACCEPTABLE FOR SPUTUM CULTURE Performed at [facility], [place] [address], [place], [place] [address]    Report Status 08/20/2018 FINAL  Final  Culture, respiratory     Status: None (Preliminary result)   Collection Time: 08/20/18  7:47 AM   Specimen: SPU  Result Value Ref Range Status   Specimen Description   Final    SPUTUM Performed at [place] [address] [place], [place] [address]    Special Requests   Final    NONE Reflexed from [id number] Performed at [facility], [place] [address] [place], [place] [address]    Gram Stain   Final    RARE WBC PRESENT, PREDOMINANTLY PMN FEW GRAM POSITIVE COCCI FEW GRAM NEGATIVE RODS Performed at [facility], [place] [address] [place], [place] [address]    Culture PENDING  Incomplete   Report Status PENDING  Incomplete         Radiology Studies: Dg Chest 1 View  Result Date: 08/20/2018 CLINICAL DATA:  Shortness of breath. Infection of larynx, trachea bronchi or lungs. EXAM: CHEST  1 VIEW COMPARISON:  Radiographs of August 19, 2018. CT scan of July 24th 2020. FINDINGS: Stable cardiac silhouette. Stable appearance of right upper lobe mass with right paratracheal prominence consistent with adenopathy. No pneumothorax or pleural effusion is noted. No consolidative process is noted. Bony thorax is unremarkable. IMPRESSION: Stable appearance  of right upper lobe mass with right paratracheal adenopathy. No significant change compared to prior exam. Electronically Signed   By: Marijo Conception M.D.   On: 08/20/2018 08:20   Ct Abdomen Pelvis W Contrast  Result Date: 08/27/2018 CLINICAL DATA:  Abdominal pain, known malignancy, recurrence neoplasm of the bladder EXAM: CT ABDOMEN AND PELVIS WITH CONTRAST TECHNIQUE: Multidetector CT imaging of the abdomen and pelvis was performed using the standard protocol following bolus administration of intravenous contrast. CONTRAST:  19mL OMNIPAQUE IOHEXOL 300 MG/ML  SOLN COMPARISON:  August 01, 2018 FINDINGS: Lower chest: There is mild cardiomegaly. There is streaky atelectasis or scarring at the right lung base. No hiatal hernia. The visualized portions of the lungs are clear. Hepatobiliary: Multiple peripherally enhancing hypodense liver masses are seen throughout the liver parenchyma they have increased in size and number from the prior exam. For reference in the inferior right liver lobe the mass measured 1.2 cm on series 2, image 72, and now measures 3.1 cm seen on series 2, image 44. The main portal vein is patent. There is a moderate amount of perihepatic ascites. No evidence of calcified gallstones, gallbladder wall thickening or biliary dilatation. Pancreas: Unremarkable. No pancreatic ductal dilatation or surrounding inflammatory changes. Spleen: Normal in size without focal abnormality. Adrenals/Urinary Tract: Both adrenal glands appear normal. Again noted is a left-sided double-J ureteral stent. There is moderate right hydronephrosis which is slightly decreased from the prior exam. There is bladder wall thickening, predominantly within the right bladder wall and bladder dome which appears unchanged from prior. Stomach/Bowel: The stomach, small bowel, and colon are normal in appearance. No inflammatory changes, wall thickening, or obstructive findings. Vascular/Lymphatic: There are no enlarged mesenteric,  retroperitoneal, or pelvic lymph nodes. Scattered aortic atherosclerotic calcifications are seen without aneurysmal dilatation. Reproductive: The prostate is unremarkable. Other: No evidence of abdominal wall mass or  hernia. Musculoskeletal: No acute or significant osseous findings. IMPRESSION: 1. Numerous hypodense liver masses seen throughout which appear larger and more numerous in size from the prior exam August 01, 2018. This is concerning for infectious liver abscesses. 2. New small amount perihepatic and abdominal ascites. 3. Right-sided double-J ureteral stent with mild hydronephrosis 4. Bladder wall thickening/soft tissue nodule as on the prior exam. Electronically Signed   By: Prudencio Pair M.D.   On: 08/12/2018 18:15   Dg Chest Portable 1 View  Result Date: 08/14/2018 CLINICAL DATA:  RIGHT upper quadrant pain, distension and tenderness, shortness of breath. History of kidney cancer with hepatic metastases. EXAM: PORTABLE CHEST 1 VIEW COMPARISON:  Chest x-rays dated 08/01/2018 and 07/28/2017. FINDINGS: Heart size is within normal limits. Again noted is the RIGHT upper lobe pulmonary mass, not appreciably changed compared to most recent chest x-ray of 08/01/2018, better demonstrated on chest CT of 08/01/2018. Least mild mediastinal prominence corresponding to the mediastinal and hilar lymph node metastases described on earlier chest CT. No new lung findings. No pleural effusion or pneumothorax seen. Osseous structures about the chest are unremarkable. IMPRESSION: 1. No acute findings.  No evidence of pneumonia or pulmonary edema. 2. Grossly stable appearance of patient's previously demonstrated RIGHT upper lobe pulmonary mass. The mediastinal and hilar prominence is compatible with the previously demonstrated lymph node metastases. Electronically Signed   By: Franki Cabot M.D.   On: 08/09/2018 15:59   Vas [place] Lower Extremity Venous (dvt)  Result Date: 08/21/2018  Lower Venous Study Indications: Pain,  and Edema.  Risk Factors: Cancer Bladder with metastasis to the hepatic system Respiratory failure. Comparison Study: No previous study aailable for comparison Performing Technologist: [employer] [facility]  Examination Guidelines: A complete evaluation includes B-mode imaging, spectral Doppler, color Doppler, and power Doppler as needed of all accessible portions of each vessel. Bilateral testing is considered an integral part of a complete examination. Limited examinations for reoccurring indications may be performed as noted.  +---------+---------------+---------+-----------+----------+-------+  RIGHT     Compressibility Phasicity Spontaneity Properties Summary  +---------+---------------+---------+-----------+----------+-------+  CFV       Full            No        Yes                             +---------+---------------+---------+-----------+----------+-------+  SFJ       Full            No                                        +---------+---------------+---------+-----------+----------+-------+  FV Prox   Full            No        Yes                             +---------+---------------+---------+-----------+----------+-------+  FV Mid    Full            No        Yes                             +---------+---------------+---------+-----------+----------+-------+  FV Distal Full            Yes  Yes                             +---------+---------------+---------+-----------+----------+-------+  PFV       Full            No                                        +---------+---------------+---------+-----------+----------+-------+  POP       Full            No                                        +---------+---------------+---------+-----------+----------+-------+  PTV       Full                                                      +---------+---------------+---------+-----------+----------+-------+  PERO      Full                                                       +---------+---------------+---------+-----------+----------+-------+   Right Technical Findings: Unable to visualize the iliac due to abdominal distention. Venous flow pattern is abnormal throughout consistent with a more proximal obstruction  +---------+---------------+---------+-----------+----------+-------+  LEFT      Compressibility Phasicity Spontaneity Properties Summary  +---------+---------------+---------+-----------+----------+-------+  CFV       Full            Yes       Yes                             +---------+---------------+---------+-----------+----------+-------+  SFJ       Full                                                      +---------+---------------+---------+-----------+----------+-------+  FV Prox   Full            Yes       Yes                             +---------+---------------+---------+-----------+----------+-------+  FV Mid    Full                                                      +---------+---------------+---------+-----------+----------+-------+  FV Distal Full            Yes       Yes                             +---------+---------------+---------+-----------+----------+-------+  PFV       Full            Yes       Yes                             +---------+---------------+---------+-----------+----------+-------+  POP       Full            Yes       Yes                             +---------+---------------+---------+-----------+----------+-------+  PTV       Full                                                      +---------+---------------+---------+-----------+----------+-------+  PERO      Full                                                      +---------+---------------+---------+-----------+----------+-------+     Summary: Right: There is no evidence of deep vein thrombosis in the lower extremity. No cystic structure found in the popliteal fossa. See technical findings listed above Left: There is no evidence of deep vein thrombosis in the lower extremity. No  cystic structure found in the popliteal fossa.  *See table(s) above for measurements and observations. Electronically signed by Curt Jews MD on 08/21/2018 at 7:16:49 AM.    Final         Scheduled Meds:  amLODipine  10 mg Oral Daily   aspirin EC  81 mg Oral Daily   carvedilol  3.125 mg Oral BID   Chlorhexidine Gluconate Cloth  6 each Topical Daily   insulin aspart  0-5 Units Subcutaneous QHS   insulin aspart  0-9 Units Subcutaneous TID WC   ipratropium-albuterol  3 mL Nebulization Q6H   latanoprost  1 drop Both Eyes QHS   mouth rinse  15 mL Mouth Rinse BID   mometasone-formoterol  2 puff Inhalation BID   pantoprazole  40 mg Oral Q0600   senna-docusate  1 tablet Oral BID   tamsulosin  0.4 mg Oral Daily   umeclidinium bromide  1 puff Inhalation Daily   [START ON 08/25/2018] Vitamin D (Ergocalciferol)  50,000 Units Oral Weekly   Continuous Infusions:  sodium chloride Stopped (09/06/2018 2005)   ceFEPime (MAXIPIME) IV Stopped (08/20/18 2249)     LOS: 2 days    Time spent: 35 minutes.     Elmarie Shiley, MD [facility] Pager [phone]  If 7PM-7AM, please contact night-coverage [email or web address] Password [email or web address] 08/21/2018, 8:10 AM

## 2018-08-22 LAB — COMPREHENSIVE METABOLIC PANEL
ALT: 101 U/L — ABNORMAL HIGH (ref 0–44)
AST: 273 U/L — ABNORMAL HIGH (ref 15–41)
Albumin: 1.8 g/dL — ABNORMAL LOW (ref 3.5–5.0)
Alkaline Phosphatase: 411 U/L — ABNORMAL HIGH (ref 38–126)
Anion gap: 10 (ref 5–15)
BUN: 54 mg/dL — ABNORMAL HIGH (ref 8–23)
CO2: 20 mmol/L — ABNORMAL LOW (ref 22–32)
Calcium: 7.4 mg/dL — ABNORMAL LOW (ref 8.9–10.3)
Chloride: 103 mmol/L (ref 98–111)
Creatinine, Ser: 1.7 mg/dL — ABNORMAL HIGH (ref 0.61–1.24)
GFR calc Af Amer: 45 mL/min — ABNORMAL LOW (ref >60)
GFR calc non Af Amer: 39 mL/min — ABNORMAL LOW (ref >60)
Glucose, Bld: 170 mg/dL — ABNORMAL HIGH (ref 70–99)
Potassium: 4.1 mmol/L (ref 3.5–5.1)
Sodium: 133 mmol/L — ABNORMAL LOW (ref 135–145)
Total Bilirubin: 4.6 mg/dL — ABNORMAL HIGH (ref 0.3–1.2)
Total Protein: 5.2 g/dL — ABNORMAL LOW (ref 6.5–8.1)

## 2018-08-22 LAB — CBC
HCT: 25.1 % — ABNORMAL LOW (ref 39.0–52.0)
Hemoglobin: 8.1 g/dL — ABNORMAL LOW (ref 13.0–17.0)
MCH: 26.5 pg (ref 26.0–34.0)
MCHC: 32.3 g/dL (ref 30.0–36.0)
MCV: 82 fL (ref 80.0–100.0)
Platelets: 143 10*3/uL — ABNORMAL LOW (ref 150–400)
RBC: 3.06 MIL/uL — ABNORMAL LOW (ref 4.22–5.81)
RDW: 19.5 % — ABNORMAL HIGH (ref 11.5–15.5)
WBC: 14.9 10*3/uL — ABNORMAL HIGH (ref 4.0–10.5)
nRBC: 0 % (ref 0.0–0.2)

## 2018-08-22 LAB — GLUCOSE, CAPILLARY
Glucose-Capillary: 202 mg/dL — ABNORMAL HIGH (ref 70–99)
Glucose-Capillary: 146 mg/dL — ABNORMAL HIGH (ref 70–99)
Glucose-Capillary: 180 mg/dL — ABNORMAL HIGH (ref 70–99)
Glucose-Capillary: 142 mg/dL — ABNORMAL HIGH (ref 70–99)

## 2018-08-22 LAB — CULTURE, RESPIRATORY W GRAM STAIN: Culture: NORMAL

## 2018-08-22 LAB — ACID FAST SMEAR (AFB, MYCOBACTERIA): Acid Fast Smear: NEGATIVE

## 2018-08-22 MED ORDER — ACETAMINOPHEN 500 MG PO TABS
500.0000 mg | ORAL_TABLET | Freq: Once | ORAL | Status: AC
  Administered 2018-08-22: 500 mg via ORAL
  Filled 2018-08-22: qty 1

## 2018-08-22 MED ORDER — ACETAMINOPHEN 500 MG PO TABS: 500.0000 mg | ORAL_TABLET | Freq: Four times a day (QID) | ORAL | Status: DC | PRN

## 2018-08-22 MED ORDER — HYDROMORPHONE HCL 1 MG/ML IJ SOLN
1.0000 mg | INTRAMUSCULAR | Status: DC | PRN
  Administered 2018-08-23: 08:00:00 1 mg via INTRAVENOUS
  Administered 2018-08-23: 2 mg via INTRAVENOUS
  Filled 2018-08-22: qty 2
  Filled 2018-08-22: qty 1

## 2018-08-22 MED ORDER — HYDROMORPHONE HCL 1 MG/ML IJ SOLN
1.0000 mg | Freq: Once | INTRAMUSCULAR | Status: AC
  Administered 2018-08-22: 1 mg via INTRAVENOUS

## 2018-08-22 MED ORDER — FENTANYL CITRATE (PF) 100 MCG/2ML IJ SOLN
50.0000 ug | Freq: Once | INTRAMUSCULAR | Status: AC
  Administered 2018-08-22: 20:00:00 50 ug via INTRAVENOUS

## 2018-08-22 MED ORDER — FENTANYL CITRATE (PF) 100 MCG/2ML IJ SOLN
INTRAMUSCULAR | Status: AC
  Administered 2018-08-22: 20:00:00 50 ug via INTRAVENOUS
  Filled 2018-08-22: qty 2

## 2018-08-22 MED ORDER — HYDROMORPHONE HCL 1 MG/ML IJ SOLN
0.5000 mg | INTRAMUSCULAR | Status: DC | PRN
  Administered 2018-08-22 (×3): 1 mg via INTRAVENOUS
  Filled 2018-08-22 (×3): qty 1

## 2018-08-22 NOTE — Progress Notes (Signed)
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  Patient presents to the ED with abdominal pain, fever and shortness of breath.  He was found to have a hemoglobin at 6 down from 10.  UA with large leukocytes more than 50 white blood cell.  Increase liver function test.  CT abdomen and pelvis show numerous hypodense liver masses which appear larger and more numerous in size from prior exam in July, concerning with liver abscess.  Mild ascites.  Right side double-J ureteral stent with mild hydronephrosis.  Patient was admitted to the stepdown unit, started on IV cefepime.  Overnight he became more tachypneic and hypoxic requiring nonrebreather mask.  patient continues to be tachypneic, using accessory muscles to breath.  Chest x-ray ordered stat, BiPAP order, IV Lasix and blood gas ordered. ID, CCM oncology consulted.  ID is recommending paracentesis and continuation of antibiotics.   Assessment & Plan:   Active Problems:   Abdominal pain  1-Acute hypoxic respiratory failure;  Patient develops worsening hypoxemia, hypercapnia overnight 8/12.  Chest x ray stable right upper lobe mass.  BIPAP PRN. He is off BIPAP this am. 2 L oxygen, mild tachypnea.  CCM consulted. Appreciate assistance. Doppler LE negative Patient is now DNR, DNI.  Continue with nebulizer.  Hold on lasix.  Stable.   2-Bladder cancer, metastatic to liver and lung; transaminases.  Discussed with Dr [name] patient is poor  prognosis, he will follow in consultation.  Prior Biopsy showed; ischemic necrosis neoplasm.  Ammonia nl.  Appreciate Dr [name] visit today.   3-Sepsis; ? Liver abscess, peritonitis.  Patient presents with fever, leukocytosis;  Follow urine culture, bloo culture.  ? Superimpose infection on  liver metaststatic diseases. Continue with cefepime. ID consulted. Paracentesis ordered. Yielding 2300 WBC, neutrophil 73, consistent with peritonitis. Follow culture.  Appreciate Dr Baxter Flattery help.  Continue with cefepime.  WBC trending down.   4-Abdominal pain; related to liver mets.  PRN dilaudid.   5-HTN;  Continue with coreg, norvasc, PRN hydralazine.   6-Acute blood loss anemia;  S/P transfusion 2 units PRBC. Monitor for sign of bleeding.  Hb decrease to 8.5 Follow trend. Monitor for bleeding.   7-COPD exacerbation Schedule nebulizer.   8-Oral thrush: Continue nystatin and fluconazole.   Estimated body mass index is 23.2 kg/m as calculated from the following:   Height as of this encounter: 5\' 4"  (1.626 m).   Weight as of this encounter: 61.3 kg.   DVT prophylaxis: scd Code Status: Discussed with Daughter who were at bedside, they agree with DNR status. Plan for palliative consult for goal of care.  Family Communication: no family at bedside Disposition Plan: remain in the step down Consultants:   CCM  Oncology   Procedures:   none  Antimicrobials:  cefepime  Subjective: He is alert, pain is little better, still with nausea.    Objective: Vitals:   08/22/18 0417 08/22/18 0437 08/22/18 0500 08/22/18 0600  BP: (!) 135/55  (!) 135/49 (!) 121/46  Pulse: 81  81 77  Resp: 19  19  18  Temp: 99.3 F (37.4 C)  99.1 F (37.3 C) 99 F (37.2 C)  TempSrc:      SpO2: 91%  92% 90%  Weight:  61.3 kg    Height:        Intake/Output Summary (Last 24 hours) at 08/22/2018 0818 Last data filed at 08/22/2018 0400 Gross per 24 hour  Intake 1196.73 ml  Output 600 ml  Net  596.73 ml   Filed Weights   08/28/2018 2150 08/22/18 0437  Weight: 61.4 kg 61.3 kg    Examination:  General exam: NAD Respiratory system: Bilateral air movement. No ronchus.  Cardiovascular system: S 1, S 2 RRR Gastrointestinal system: BS present, soft, distended, tender.  Central nervous system: alert, in no acute distress.  Extremities: Symmetric power.  Skin: No rashes or ulcer.  Psychiatry: Mood and affect appropriate.   Data Reviewed: I have personally reviewed following labs and imaging studies  CBC: Recent Labs  Lab 09/03/2018 1449 09/03/2018 1925 08/20/18 0509 08/21/18 0741 08/22/18 0201  WBC 11.5* 12.6* 14.3* 16.5* 14.9*  NEUTROABS 9.7*  --   --   --   --   HGB 6.0* 5.9* 9.2* 8.5* 8.1*  HCT 19.4* 19.5* 28.8* 25.9* 25.1*  MCV 78.2* 79.6* 83.5 80.2 82.0  PLT 163 158 136* 150 062*   Basic Metabolic Panel: Recent Labs  Lab 08/09/2018 1449 08/20/18 0509 08/21/18 0741 08/22/18 0201  NA 135 136 138 133*  K 3.4* 3.9 4.7 4.1  CL 103 106 105 103  CO2 22 23 15* 20*  GLUCOSE 170* 112* 141* 170*  BUN 24* 21 46* 54*  CREATININE 1.28* 1.21 1.63* 1.70*  CALCIUM 7.6* 7.3* 7.7* 7.4*  MG  --  1.7  --   --   PHOS  --  3.2  --   --    GFR: Estimated Creatinine Clearance: 32.4 mL/min (A) (by C-G formula based on SCr of 1.7 mg/dL (H)). Liver Function Tests: Recent Labs  Lab 08/26/2018 1449 08/20/18 0509 08/21/18 0741 08/22/18 0201  AST 129* 148* 218* 273*  ALT 67* 71* 89* 101*  ALKPHOS 642* 586* 467* 411*  BILITOT 2.5* 3.7* 4.0* 4.6*  PROT 5.6* 5.5* 5.4* 5.2*  ALBUMIN 2.1* 2.1* 1.9* 1.8*   Recent Labs  Lab 08/12/2018 1449  LIPASE 16   Recent Labs  Lab 08/20/18 0800  AMMONIA 29   Coagulation Profile: Recent Labs  Lab 08/27/2018 1925  INR 1.4*   Cardiac Enzymes: No results for input(s): CKTOTAL, CKMB, CKMBINDEX, TROPONINI in the last 168 hours. BNP (last 3 results) No results for input(s): PROBNP in the last 8760 hours. HbA1C: No results for input(s):  HGBA1C in the last 72 hours. CBG: Recent Labs  Lab 08/21/18 0802 08/21/18 1116 08/21/18 1539 08/21/18 2116 08/22/18 0733  GLUCAP 122* 153* 148* 203* 202*   Lipid Profile: No results for input(s): CHOL, HDL, LDLCALC, TRIG, CHOLHDL, LDLDIRECT in the last 72 hours. Thyroid Function Tests: No results for input(s): TSH, T4TOTAL, FREET4, T3FREE, THYROIDAB in the last 72 hours. Anemia Panel: No results for input(s): VITAMINB12, FOLATE, FERRITIN, TIBC, IRON, RETICCTPCT in the last 72 hours. Sepsis Labs: Recent Labs  Lab 09/01/2018 1454 08/20/18 0800 08/20/18 1026  LATICACIDVEN 1.7 1.6 1.4    Recent Results (from the past 240 hour(s))  Blood culture (routine x 2)     Status: None (Preliminary result)   Collection Time: 08/23/2018  3:22 PM   Specimen: BLOOD  Result Value Ref Range Status   Specimen Description  Final    BLOOD LEFT ANTECUBITAL Performed at [facility] [address], [place], [place] [address]    Special Requests   Final    BOTTLES DRAWN AEROBIC AND ANAEROBIC Blood Culture adequate volume Performed at [facility] [address], [place], [place] [address]    Culture   Final    NO GROWTH 2 DAYS Performed at [facility] [address], [place], [place] [address]    Report Status PENDING  Incomplete  Blood culture (routine x 2)     Status: None (Preliminary result)   Collection Time: 08/27/2018  3:22 PM   Specimen: BLOOD RIGHT HAND  Result Value Ref Range Status   Specimen Description   Final    BLOOD RIGHT HAND Performed at [facility] [address], [place], [place] [address]    Special Requests   Final    BOTTLES DRAWN AEROBIC AND ANAEROBIC Blood Culture adequate volume Performed at [facility] [address], [place], [place] [address]    Culture   Final    NO GROWTH 2 DAYS Performed at [facility] [address], [place], [place] [address]    Report  Status PENDING  Incomplete  Urine Culture     Status: Abnormal   Collection Time: 08/23/2018  3:23 PM   Specimen: Urine, Clean Catch  Result Value Ref Range Status   Specimen Description   Final    URINE, CLEAN CATCH Performed at [facility], [place] [address], [place], [place] [address]    Special Requests   Final    NONE Performed at [facility], [place] [address] [place], [place] [address]    Culture (A)  Final    <10,000 COLONIES/mL INSIGNIFICANT GROWTH Performed at [facility] [address] [place], [place] [address]    Report Status 08/20/2018 FINAL  Final  SARS CORONAVIRUS 2 Nasal Swab Aptima Multi Swab     Status: None   Collection Time: 08/30/2018  7:37 PM   Specimen: Aptima Multi Swab; Nasal Swab  Result Value Ref Range Status   SARS Coronavirus 2 NEGATIVE NEGATIVE Final    Comment: (NOTE) SARS-CoV-2 target nucleic acids are NOT DETECTED. The SARS-CoV-2 RNA is generally detectable in upper and lower respiratory specimens during the acute phase of infection. Negative results do not preclude SARS-CoV-2 infection, do not rule out co-infections with other pathogens, and should not be used as the sole basis for treatment or other patient management decisions. Negative results must be combined with clinical observations, patient history, and epidemiological information. The expected result is Negative. Fact Sheet for Patients: SugarRoll.be Fact Sheet for Healthcare Providers: https://www.woods-mathews.com/ This test is not yet approved or cleared by the [place] FDA and  has been authorized for detection and/or diagnosis of SARS-CoV-2 by FDA under an Emergency Use Authorization (EUA). This EUA will remain  in effect (meaning this test can be used) for the duration of the COVID-19 declaration under Section 56 4(b)(1) of the Act, 21 U.S.C. section 360bbb-3(b)(1), unless the authorization  is terminated or revoked sooner. Performed at [facility], [place] [address], [place], [place] [address]   Culture, blood (routine x 2)     Status: None (Preliminary result)   Collection Time: 08/10/2018 10:22 PM   Specimen: BLOOD RIGHT ARM  Result Value Ref Range Status   Specimen Description   Final    BLOOD RIGHT ARM  Performed at [facility], [place] [address], [place], [place] [address]    Special Requests   Final    BOTTLES DRAWN AEROBIC AND ANAEROBIC Blood Culture adequate volume Performed at [facility] [address], [place], [place] [address]    Culture   Final    NO GROWTH 1 DAY Performed at [facility], [place] [address] [place], [place] [address]    Report Status PENDING  Incomplete  Culture, blood (routine x 2)     Status: None (Preliminary result)   Collection Time: 08/16/2018 10:22 PM   Specimen: BLOOD  Result Value Ref Range Status   Specimen Description   Final    BLOOD RIGHT WRIST Performed at [facility] [address], [place], [place] [address]    Special Requests   Final    BOTTLES DRAWN AEROBIC AND ANAEROBIC Blood Culture adequate volume Performed at [facility] [address], [place], [place] [address]    Culture   Final    NO GROWTH 1 DAY Performed at [facility], [place] [address] [place], [place] [address]    Report Status PENDING  Incomplete  MRSA PCR Screening     Status: None   Collection Time: 08/30/2018 10:42 PM   Specimen: Nasal Mucosa; Nasopharyngeal  Result Value Ref Range Status   MRSA by PCR NEGATIVE NEGATIVE Final    Comment:        The GeneXpert MRSA Assay (FDA approved for NASAL specimens only), is one component of a comprehensive MRSA colonization surveillance program. It is not intended to diagnose MRSA infection nor to guide or monitor treatment for MRSA infections. Performed at [facility], [place] [address]  [place], [place] [address]   Culture, sputum-assessment     Status: None   Collection Time: 08/20/18  7:47 AM   Specimen: Sputum  Result Value Ref Range Status   Specimen Description SPUTUM  Final   Special Requests NONE  Final   Sputum evaluation   Final    THIS SPECIMEN IS ACCEPTABLE FOR SPUTUM CULTURE Performed at [facility], [place] [address], [place], [place] [address]    Report Status 08/20/2018 FINAL  Final  Culture, respiratory     Status: None (Preliminary result)   Collection Time: 08/20/18  7:47 AM   Specimen: SPU  Result Value Ref Range Status   Specimen Description   Final    SPUTUM Performed at [facility] [address] [place], [place] [address]    Special Requests   Final    NONE Reflexed from [phone] Performed at [facility], [place] [address], [place], [place] [address]    Gram Stain   Final    RARE WBC PRESENT, PREDOMINANTLY PMN FEW GRAM POSITIVE COCCI FEW GRAM NEGATIVE RODS    Culture   Final    CULTURE REINCUBATED FOR BETTER GROWTH Performed at [facility], [place] [address], [place], [place] [address]    Report Status PENDING  Incomplete  Body fluid culture     Status: None (Preliminary result)   Collection Time: 08/21/18 10:01 AM   Specimen: Paracentesis; Peritoneal Fluid  Result Value Ref Range Status   Specimen Description   Final    PARACENTESIS PERITONEAL Performed at [facility] [address] [place], [place] [address]    Special Requests   Final    NONE Performed at [facility], [place] [address] [place], [place] [address]    Gram Stain   Final    FEW WBC PRESENT, PREDOMINANTLY PMN NO ORGANISMS SEEN Performed at [facility], [place] [address], [place], [place] [address]    Culture PENDING  Incomplete   Report Status PENDING  Incomplete         Radiology Studies: US Paracentesis  Result Date: 08/21/2018 INDICATION: Bladder cancer  with metastatic disease to the liver. Ascites. Request for diagnostic and therapeutic paracentesis. EXAM: ULTRASOUND GUIDED PARACENTESIS MEDICATIONS: 1% lidocaine is mL COMPLICATIONS: None immediate. PROCEDURE: Informed written consent was obtained from the patient after a discussion of the risks, benefits and alternatives to treatment. A timeout was performed prior to the initiation of the procedure. Initial ultrasound scanning demonstrates a small amount of ascites within the right lower abdominal quadrant. The right lower abdomen was prepped and draped in the usual sterile fashion. 1% lidocaine with epinephrine was used for local anesthesia. Following this, a 19 gauge, 7-cm, Yueh catheter was introduced. An ultrasound image was saved for documentation purposes. The paracentesis was performed. The catheter was removed and a dressing was applied. The patient tolerated the procedure well without immediate post procedural complication. FINDINGS: A total of approximately 450 mL of blood was removed. Samples were sent to the laboratory as requested by the clinical team. IMPRESSION: Successful ultrasound-guided paracentesis yielding 450 mL of blood. Read by: Gareth Eagle, PA-C Electronically Signed   By: Jacqulynn Cadet M.D.   On: 08/21/2018 10:20   Vas [place] Lower Extremity Venous (dvt)  Result Date: 08/21/2018  Lower Venous Study Indications: Pain, and Edema.  Risk Factors: Cancer Bladder with metastasis to the hepatic system Respiratory failure. Comparison Study: No previous study aailable for comparison Performing Technologist: [facility]  Examination Guidelines: A complete evaluation includes B-mode imaging, spectral Doppler, color Doppler, and power Doppler as needed of all accessible portions of each vessel. Bilateral testing is considered an integral part of a complete examination. Limited examinations for reoccurring indications may be performed as noted.   +---------+---------------+---------+-----------+----------+-------+ RIGHT    CompressibilityPhasicitySpontaneityPropertiesSummary +---------+---------------+---------+-----------+----------+-------+ CFV      Full           No       Yes                          +---------+---------------+---------+-----------+----------+-------+ SFJ      Full           No                                    +---------+---------------+---------+-----------+----------+-------+ FV Prox  Full           No       Yes                          +---------+---------------+---------+-----------+----------+-------+ FV Mid   Full           No       Yes                          +---------+---------------+---------+-----------+----------+-------+ FV DistalFull           Yes      Yes                          +---------+---------------+---------+-----------+----------+-------+  PFV      Full           No                                    +---------+---------------+---------+-----------+----------+-------+ POP      Full           No                                    +---------+---------------+---------+-----------+----------+-------+ PTV      Full                                                 +---------+---------------+---------+-----------+----------+-------+ PERO     Full                                                 +---------+---------------+---------+-----------+----------+-------+   Right Technical Findings: Unable to visualize the iliac due to abdominal distention. Venous flow pattern is abnormal throughout consistent with a more proximal obstruction  +---------+---------------+---------+-----------+----------+-------+ LEFT     CompressibilityPhasicitySpontaneityPropertiesSummary +---------+---------------+---------+-----------+----------+-------+ CFV      Full           Yes      Yes                           +---------+---------------+---------+-----------+----------+-------+ SFJ      Full                                                 +---------+---------------+---------+-----------+----------+-------+ FV Prox  Full           Yes      Yes                          +---------+---------------+---------+-----------+----------+-------+ FV Mid   Full                                                 +---------+---------------+---------+-----------+----------+-------+ FV DistalFull           Yes      Yes                          +---------+---------------+---------+-----------+----------+-------+ PFV      Full           Yes      Yes                          +---------+---------------+---------+-----------+----------+-------+ POP      Full           Yes      Yes                          +---------+---------------+---------+-----------+----------+-------+  PTV      Full                                                 +---------+---------------+---------+-----------+----------+-------+ PERO     Full                                                 +---------+---------------+---------+-----------+----------+-------+     Summary: Right: There is no evidence of deep vein thrombosis in the lower extremity. No cystic structure found in the popliteal fossa. See technical findings listed above Left: There is no evidence of deep vein thrombosis in the lower extremity. No cystic structure found in the popliteal fossa.  *See table(s) above for measurements and observations. Electronically signed by Curt Jews MD on 08/21/2018 at 7:16:49 AM.    Final         Scheduled Meds: . amLODipine  10 mg Oral Daily  . aspirin EC  81 mg Oral Daily  . carvedilol  3.125 mg Oral BID  . Chlorhexidine Gluconate Cloth  6 each Topical Daily  . fluconazole  100 mg Oral Daily  . insulin aspart  0-5 Units Subcutaneous QHS  . insulin aspart  0-9 Units Subcutaneous TID WC  . ipratropium-albuterol   3 mL Nebulization Q6H  . latanoprost  1 drop Both Eyes QHS  . mouth rinse  15 mL Mouth Rinse BID  . mometasone-formoterol  2 puff Inhalation BID  . nystatin  5 mL Oral QID  . pantoprazole  40 mg Oral Q0600  . senna-docusate  1 tablet Oral BID  . tamsulosin  0.4 mg Oral Daily  . umeclidinium bromide  1 puff Inhalation Daily  . [START ON 08/25/2018] Vitamin D (Ergocalciferol)  50,000 Units Oral Weekly   Continuous Infusions: . ceFEPime (MAXIPIME) IV Stopped (08/21/18 2205)  . metronidazole Stopped (08/22/18 0207)  . sodium bicarbonate 150 mEq in dextrose 5% 1000 mL 75 mL/hr at 08/22/18 0211     LOS: 3 days    Time spent: 35 minutes.     Elmarie Shiley, MD [facility] Pager [phone]  If 7PM-7AM, please contact night-coverage [email or web address] Password [facility] 08/22/2018, 8:18 AM

## 2018-08-22 NOTE — Progress Notes (Signed)
Pharmacy Antibiotic Note  [name] is a 73 y.o. male admitted on 08/17/2018 with intra-abdominal infection.  Pharmacy has been consulted for Cefepime dosing.  08/22/2018: T max 100.8, WBC 14.9, SCr up to 1.7, CrCl ~ 32 ml/min S/p paracentesis 8/13> bacterial peritonitis.  Flagyl added by ID  Plan: Continue Cefepime 2gm IV q12h Flagy 500 IV q8 Fluconazole 100 mg PO qday & nystatin for thrush F/u renal fxn and culture data  Height: 5\' 4"  (162.6 cm)(per patient) Weight: 135 lb 2.3 oz (61.3 kg) IBW/kg (Calculated) : 59.2  Temp (24hrs), Avg:99.7 F (37.6 C), Min:98.7 F (37.1 C), Max:100.8 F (38.2 C)  Recent Labs  Lab 08/25/2018 1449 09/04/2018 1454 08/12/2018 1925 08/20/18 0509 08/20/18 0800 08/20/18 1026 08/21/18 0741 08/22/18 0201  WBC 11.5*  --  12.6* 14.3*  --   --  16.5* 14.9*  CREATININE 1.28*  --   --  1.21  --   --  1.63* 1.70*  LATICACIDVEN  --  1.7  --   --  1.6 1.4  --   --     Estimated Creatinine Clearance: 32.4 mL/min (A) (by C-G formula based on SCr of 1.7 mg/dL (H)).    No Known Allergies Antimicrobials this admission: 8/11 Cefepime >>  8/11 CTX x 1 dose  8/13 fluconazole 100 po qday/nystatin for thrush>> 8/13 Flagyl >> Dose adjustments this admission:   Microbiology results: 8/11 BCx: ngtd 8/11 UCx: insig growth F 8/12: sputum: normal flora F 8/12 BCx2:ngtd 8/11 MRSA PCR neg 8/11 Sars neg 8/13 peritoneal fluid: ngtd 8/13 AFB peritoneal fluid x2:sent  7/24 Ucx: > 100 K kleb pnemo. > 100 pseudomos aeruginosa  Thank you for allowing pharmacy to be a part of this patient's care.  Eudelia Bunch, Pharm.D [phone] 08/22/2018 10:16 AM

## 2018-08-22 NOTE — Consult Note (Signed)
Stopped by to see patient, well known to me.  I don't have much to offer medically but do know the patient and his family quite well.  He looks better to me today then when he was admitted, abx seem to be helping.  He asked me to remove his catheter, which seems reasonable to me.  I did call both [name] and Janann August, his daughters, and gave them an update with the message that his cancer has grown incredibly fast and now he has a superimposed infection.  My message to both was that he's clinically better today after starting treatment for his infection, and that if the infection clears it may be reasonable to try some palliative chemo and see how he tolerates it.  Overall, his prognosis was poor.  I told them that keeping him comfortable should be their focus.  His daughter Janann August is pregnant and due at the end of this month, he would like to meet his new granddaughter.

## 2018-08-22 NOTE — TOC Initial Note (Signed)
Transition of Care [facility]) - Initial/Assessment Note    Patient Details  Name: [name] MRN: [id number] Date of Birth: [birth year]  Transition of [facility]) CM/SW Contact:    Dashiel Bergquist, Marjie Skiff, RN Phone Number: 08/22/2018, 11:08 AM  Clinical Narrative:                   Expected Discharge Plan: Home/Self Care          Admission diagnosis:  Metastatic cancer ([place]) [C79.9] Acute cystitis without hematuria [N30.00] Anemia, unspecified type [D64.9] Abdominal pain [R10.9] Patient Active Problem List   Diagnosis Date Noted  . Abdominal pain 09/04/2018  . Acute cystitis with hematuria   . Metastatic disease ([place])   . Sepsis due to gram-negative UTI ([place]) 08/01/2018  . Liver mass, right lobe 08/01/2018  . Bladder cancer ([place]) 06/06/2018  . Pulmonary nodules 06/04/2018  . COPD GOLD II  06/03/2018  . Preop cardiovascular exam 04/27/2018  . 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 ([place]) 03/15/2017  . Hematuria 08/03/2016  . Dysuria 03/01/2015  . Type 2 diabetes mellitus with circulatory disorder ([place])   . PVC's (premature ventricular contractions)   . CAD S/P BMS PCI - pRCA, mRCA, rPAV 06/30/2012  . History of ST elevation myocardial infarction (STEMI) of inferior wall ([place]) 02/17/2012  . Hyperlipidemia associated with type 2 diabetes mellitus ([place]) 02/17/2012  . Essential hypertension 02/17/2012  . Tobacco abuse, has stopped 02/17/2012  . Allergic rhinitis 02/17/2012  . COPD with exacerbation ([place]) 05/31/2009  . OBSTRUCTIVE CHRONIC BRONCHITIS 11/13/2006  . GERD 11/13/2006   PCP:  [name], MD Pharmacy:   [id number] - [place], [place] [address] [place] [address] Phone: [phone] Fax: [phone]  [facility] [place], [place] [address] Phone: [phone] Fax: [phone]  [facility] [place], [place] [address] Phone: [phone] Fax: [phone]     Social Determinants of Health (SDOH) Interventions    Readmission Risk Interventions Readmission Risk Prevention Plan 08/22/2018  Transportation Screening Complete  PCP or Specialist Appt within 3-5 Days Not Complete  Not Complete comments Not ready for dc  HRI or [place] Complete  Social Work Consult for [place] Planning/Counseling Not Complete  SW consult not completed comments NA  Palliative Care Screening Not Applicable  Medication Review Press photographer) Complete  Some recent data might be hidden

## 2018-08-22 NOTE — Progress Notes (Signed)
[facility] for Infectious Disease    Date of Admission:  08/18/2018   Total days of antibiotics 4/cefepime/metro/flagyl           ID: [name] is a 72 y.o. 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 c/b probable secondary bacterial peritonitis Active Problems:   Abdominal pain    Subjective: Low grade temp of 100.4 this afternoon and last night up to 100.8, but still feeling poorly with abdominal discomfort, sleeping this afternoon  Medications:   amLODipine  10 mg Oral Daily   aspirin EC  81 mg Oral Daily   carvedilol  3.125 mg Oral BID   Chlorhexidine Gluconate Cloth  6 each Topical Daily   fluconazole  100 mg Oral Daily   insulin aspart  0-5 Units Subcutaneous QHS   insulin aspart  0-9 Units Subcutaneous TID WC   ipratropium-albuterol  3 mL Nebulization Q6H   latanoprost  1 drop Both Eyes QHS   mouth rinse  15 mL Mouth Rinse BID   mometasone-formoterol  2 puff Inhalation BID   nystatin  5 mL Oral QID   pantoprazole  40 mg Oral Q0600   senna-docusate  1 tablet Oral BID   tamsulosin  0.4 mg Oral Daily   umeclidinium bromide  1 puff Inhalation Daily   [START ON 08/25/2018] Vitamin D (Ergocalciferol)  50,000 Units Oral Weekly    Objective: Vital signs in last 24 hours: Temp:  [98.5 F (36.9 C)-100.8 F (38.2 C)] 100.2 F (37.9 C) (08/14 1500) Pulse Rate:  [77-96] 77 (08/14 1500) Resp:  [16-25] 21 (08/14 1500) BP: (114-164)/(45-72) 114/45 (08/14 1500) SpO2:  [88 %-100 %] 89 % (08/14 1500) Weight:  [61.3 kg] 61.3 kg (08/14 0437)   Physical Exam  Constitutional: He is oriented to person, sleeping, easily arousable. He appears chronically ill. No distress.  HENT:  Mouth/Throat: Oropharynx is clear and moist. No oropharyngeal exudate.  Cardiovascular: Normal rate, regular rhythm and normal heart sounds. Exam reveals no gallop and no friction rub.  No murmur heard.  Pulmonary/Chest: Effort normal and breath sounds normal. No  respiratory distress. He has no wheezes.  Abdominal: Soft. Protuberant abdomen, with mild tenderness. Bowel sounds are normal. He exhibits no distension.   Skin: Skin is warm to touch     Lab Results Recent Labs    08/21/18 0741 08/22/18 0201  WBC 16.5* 14.9*  HGB 8.5* 8.1*  HCT 25.9* 25.1*  NA 138 133*  K 4.7 4.1  CL 105 103  CO2 15* 20*  BUN 46* 54*  CREATININE 1.63* 1.70*   Liver Panel Recent Labs    08/20/18 0509 08/21/18 0741 08/22/18 0201  PROT 5.5* 5.4* 5.2*  ALBUMIN 2.1* 1.9* 1.8*  AST 148* 218* 273*  ALT 71* 89* 101*  ALKPHOS 586* 467* 411*  BILITOT 3.7* 4.0* 4.6*  BILIDIR 2.4*  --   --     Microbiology: reviewed Studies/Results: US Paracentesis  Result Date: 08/21/2018 INDICATION: Bladder cancer with metastatic disease to the liver. Ascites. Request for diagnostic and therapeutic paracentesis. EXAM: ULTRASOUND GUIDED PARACENTESIS MEDICATIONS: 1% lidocaine is mL COMPLICATIONS: None immediate. PROCEDURE: Informed written consent was obtained from the patient after a discussion of the risks, benefits and alternatives to treatment. A timeout was performed prior to the initiation of the procedure. Initial ultrasound scanning demonstrates a small amount of ascites within the right lower abdominal quadrant. The right lower abdomen was prepped and draped in the usual sterile fashion. 1% lidocaine with  epinephrine was used for local anesthesia. Following this, a 19 gauge, 7-cm, Yueh catheter was introduced. An ultrasound image was saved for documentation purposes. The paracentesis was performed. The catheter was removed and a dressing was applied. The patient tolerated the procedure well without immediate post procedural complication. FINDINGS: A total of approximately 450 mL of blood was removed. Samples were sent to the laboratory as requested by the clinical team. IMPRESSION: Successful ultrasound-guided paracentesis yielding 450 mL of blood. Read by: Gareth Eagle, PA-C  Electronically Signed   By: Jacqulynn Cadet M.D.   On: 08/21/2018 10:20     Assessment/Plan: Bacterial peritonitis = will continue with cefepime plus metronidazole for now. May consider to narrow but would like to see decrease in leukocytosis - though it may not be able to decrease given response to malignancy  Intermittent fevers = likely multifactorial, malignancy and infection. Will plan to treat for SBP  Malignancy = potentially candidate for palliative chemo after infection however overall prognosis is poor.  Dr Lucianne Lei dam available for questions over the weekend and will look out for results  [facility] for Infectious Diseases Cell: [phone] Pager: [phone]  08/22/2018, 4:22 PM

## 2018-08-23 DIAGNOSIS — R509 Fever, unspecified: Secondary | ICD-10-CM

## 2018-08-23 LAB — CBC
HCT: 22.9 % — ABNORMAL LOW (ref 39.0–52.0)
Hemoglobin: 7.3 g/dL — ABNORMAL LOW (ref 13.0–17.0)
MCH: 26.4 pg (ref 26.0–34.0)
MCHC: 31.9 g/dL (ref 30.0–36.0)
MCV: 82.7 fL (ref 80.0–100.0)
Platelets: 129 10*3/uL — ABNORMAL LOW (ref 150–400)
RBC: 2.77 MIL/uL — ABNORMAL LOW (ref 4.22–5.81)
RDW: 19.8 % — ABNORMAL HIGH (ref 11.5–15.5)
WBC: 11.9 10*3/uL — ABNORMAL HIGH (ref 4.0–10.5)
nRBC: 0 % (ref 0.0–0.2)

## 2018-08-23 LAB — COMPREHENSIVE METABOLIC PANEL
ALT: 117 U/L — ABNORMAL HIGH (ref 0–44)
AST: 374 U/L — ABNORMAL HIGH (ref 15–41)
Albumin: 1.7 g/dL — ABNORMAL LOW (ref 3.5–5.0)
Alkaline Phosphatase: 329 U/L — ABNORMAL HIGH (ref 38–126)
Anion gap: 13 (ref 5–15)
BUN: 61 mg/dL — ABNORMAL HIGH (ref 8–23)
CO2: 26 mmol/L (ref 22–32)
Calcium: 7.4 mg/dL — ABNORMAL LOW (ref 8.9–10.3)
Chloride: 99 mmol/L (ref 98–111)
Creatinine, Ser: 2.15 mg/dL — ABNORMAL HIGH (ref 0.61–1.24)
GFR calc Af Amer: 34 mL/min — ABNORMAL LOW (ref >60)
GFR calc non Af Amer: 29 mL/min — ABNORMAL LOW (ref >60)
Glucose, Bld: 202 mg/dL — ABNORMAL HIGH (ref 70–99)
Potassium: 3.5 mmol/L (ref 3.5–5.1)
Sodium: 138 mmol/L (ref 135–145)
Total Bilirubin: 5.4 mg/dL — ABNORMAL HIGH (ref 0.3–1.2)
Total Protein: 5.1 g/dL — ABNORMAL LOW (ref 6.5–8.1)

## 2018-08-23 LAB — GLUCOSE, CAPILLARY
Glucose-Capillary: 150 mg/dL — ABNORMAL HIGH (ref 70–99)
Glucose-Capillary: 122 mg/dL — ABNORMAL HIGH (ref 70–99)
Glucose-Capillary: 81 mg/dL (ref 70–99)
Glucose-Capillary: 76 mg/dL (ref 70–99)

## 2018-08-23 MED ORDER — FENTANYL 25 MCG/HR TD PT72
1.0000 | MEDICATED_PATCH | TRANSDERMAL | Status: DC
  Administered 2018-08-23: 1 via TRANSDERMAL
  Filled 2018-08-23: qty 1

## 2018-08-23 MED ORDER — SODIUM CHLORIDE 0.9 % IV SOLN
2.0000 g | INTRAVENOUS | Status: DC
  Administered 2018-08-23: 20:00:00 2 g via INTRAVENOUS
  Filled 2018-08-23 (×2): qty 2

## 2018-08-23 MED ORDER — IPRATROPIUM-ALBUTEROL 20-100 MCG/ACT IN AERS
1.0000 | INHALATION_SPRAY | Freq: Four times a day (QID) | RESPIRATORY_TRACT | Status: DC
  Administered 2018-08-23 – 2018-08-24 (×3): 1 via RESPIRATORY_TRACT
  Filled 2018-08-23: qty 4

## 2018-08-23 MED ORDER — IPRATROPIUM BROMIDE HFA 17 MCG/ACT IN AERS: 2.0000 | INHALATION_SPRAY | RESPIRATORY_TRACT | Status: DC

## 2018-08-23 MED ORDER — HYDROMORPHONE HCL 1 MG/ML IJ SOLN
1.0000 mg | INTRAMUSCULAR | Status: DC | PRN
  Administered 2018-08-23 – 2018-08-24 (×5): 2 mg via INTRAVENOUS
  Filled 2018-08-23 (×6): qty 2

## 2018-08-23 MED ORDER — HYDROMORPHONE HCL 2 MG/ML IJ SOLN
0.5000 mg | Freq: Once | INTRAMUSCULAR | Status: AC
  Administered 2018-08-23: 09:00:00 0.5 mg via INTRAVENOUS
  Filled 2018-08-23: qty 1

## 2018-08-23 MED ORDER — ALBUTEROL SULFATE HFA 108 (90 BASE) MCG/ACT IN AERS
2.0000 | INHALATION_SPRAY | Freq: Four times a day (QID) | RESPIRATORY_TRACT | Status: DC
  Filled 2018-08-23: qty 6.7

## 2018-08-23 MED ORDER — SODIUM CHLORIDE 0.9 % IV SOLN
INTRAVENOUS | Status: DC
  Administered 2018-08-23: 11:00:00 via INTRAVENOUS

## 2018-08-23 NOTE — Progress Notes (Signed)
Events are noted in the last few days.  Mr. Stepter continues to clinically decline with continues complain of abdominal and back pain.  He appears to be more restless and delirious at times.  He does not appear comfortable and appears anxious at this time.  I reiterated to him that he is not a candidate for any additional therapy for his cancer and comfort should be the goal.  Aggressively addressing his symptoms is the focus moving forward with the help of the primary team, infectious disease as well as the palliative medicine services.  His overall prognosis is very poor with limited life expectancy and transitioning to comfort measures would be reasonable given his overall decline.  This was reiterated to him as well but I am not quite sure he is coherent enough to understand the message.

## 2018-08-23 NOTE — Progress Notes (Signed)
Pharmacy Antibiotic Note  [name] is a 73 y.o. male admitted on 09/01/2018 with intra-abdominal infection.  Pharmacy has been consulted for Cefepime dosing.  08/23/2018: T max 100.9, WBC 11.9 - coming down, SCr up to 2.15, CrCl ~ 26 ml/min S/p paracentesis 8/13> bacterial peritonitis.  Flagyl added by ID  Plan: decrease Cefepime to  2gm IV q24h for CrCl < 30 Flagy 500 IV q8 Fluconazole 100 mg PO qday & nystatin for thrush F/u renal fxn and culture data  Height: 5\' 4"  (162.6 cm)(per patient) Weight: 135 lb 2.3 oz (61.3 kg) IBW/kg (Calculated) : 59.2  Temp (24hrs), Avg:99.5 F (37.5 C), Min:97.9 F (36.6 C), Max:100.9 F (38.3 C)  Recent Labs  Lab 08/26/2018 1449 08/27/2018 1454 08/29/2018 1925 08/20/18 0509 08/20/18 0800 08/20/18 1026 08/21/18 0741 08/22/18 0201 08/23/18 0142  WBC 11.5*  --  12.6* 14.3*  --   --  16.5* 14.9* 11.9*  CREATININE 1.28*  --   --  1.21  --   --  1.63* 1.70* 2.15*  LATICACIDVEN  --  1.7  --   --  1.6 1.4  --   --   --     Estimated Creatinine Clearance: 25.6 mL/min (A) (by C-G formula based on SCr of 2.15 mg/dL (H)).    No Known Allergies Antimicrobials this admission: 8/11 Cefepime >>  8/11 CTX x 1 dose  8/13 fluconazole 100 po qday/nystatin for thrush>> 8/13 Flagyl >> Dose adjustments this admission: 8/15 cefepime 2 g q12>>decr q24 Microbiology results: 8/11 BCx: ngtd 8/11 UCx: insig growth F 8/12: sputum: normal flora F 8/12 BCx2:ngtd 8/11 MRSA PCR neg 8/11 Sars neg 8/13 peritoneal fluid: ngtd 8/13 AFB peritoneal fluid x2:neg  7/24 Ucx: > 100 K kleb pnemo. > 100 pseudomos aeruginosa  Thank you for allowing pharmacy to be a part of this patient's care.  Eudelia Bunch, Pharm.D [phone] 08/23/2018 8:00 AM

## 2018-08-23 NOTE — Progress Notes (Signed)
Daily Progress Note   Patient Name: [name]       Date: 08/23/2018 DOB: May 22, 1945  Age: 73 y.o. MRN#: [id number] Attending Physician: Elmarie Shiley, MD Primary Care Physician: [name], MD Admit Date: 08/22/2018  Reason for Consultation/Follow-up: Establishing goals of care, Pain control and Psychosocial/spiritual support  Subjective:  Patient is moaning, yelling out. He is congested and coughing up thick mucous. He does not seem coherent and is very agitated.  Length of Stay: 4  Current Medications: Scheduled Meds:  . aspirin EC  81 mg Oral Daily  . carvedilol  3.125 mg Oral BID  . Chlorhexidine Gluconate Cloth  6 each Topical Daily  . fluconazole  100 mg Oral Daily  . insulin aspart  0-5 Units Subcutaneous QHS  . insulin aspart  0-9 Units Subcutaneous TID WC  . ipratropium-albuterol  3 mL Nebulization Q6H  . latanoprost  1 drop Both Eyes QHS  . mouth rinse  15 mL Mouth Rinse BID  . mometasone-formoterol  2 puff Inhalation BID  . nystatin  5 mL Oral QID  . pantoprazole  40 mg Oral Q0600  . senna-docusate  1 tablet Oral BID  . tamsulosin  0.4 mg Oral Daily  . umeclidinium bromide  1 puff Inhalation Daily  . [START ON 08/25/2018] Vitamin D (Ergocalciferol)  50,000 Units Oral Weekly    Continuous Infusions: . sodium chloride 75 mL/hr at 08/23/18 1039  . ceFEPime (MAXIPIME) IV    . metronidazole 500 mg (08/23/18 1044)    PRN Meds: albuterol, HYDROmorphone (DILAUDID) injection, ondansetron (ZOFRAN) IV  Physical Exam          Vital Signs: BP (!) 150/58   Pulse 86   Temp 98.2 F (36.8 C) (Oral)   Resp 12   Ht 5\' 4"  (1.626 m) Comment: per patient  Wt 61.3 kg   SpO2 95%   BMI 23.20 kg/m  SpO2: SpO2: 95 % O2 Device: O2 Device: Room Air O2 Flow Rate: O2  Flow Rate (L/min): 2 L/min  Intake/output summary:   Intake/Output Summary (Last 24 hours) at 08/23/2018 1053 Last data filed at 08/23/2018 0300 Gross per 24 hour  Intake 1590.16 ml  Output 350 ml  Net 1240.16 ml   LBM: Last BM Date: 08/20/18 Baseline Weight: Weight: 61.4 kg Most recent weight: Weight: 61.3  kg       Palliative Assessment/Data:      Patient Active Problem List   Diagnosis Date Noted  . Abdominal pain 08/20/2018  . Acute cystitis with hematuria   . Metastatic disease ([place])   . Sepsis due to gram-negative UTI ([place]) 08/01/2018  . Liver mass, right lobe 08/01/2018  . Bladder cancer ([place]) 06/06/2018  . Pulmonary nodules 06/04/2018  . COPD GOLD II  06/03/2018  . Preop cardiovascular exam 04/27/2018  . 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 ([place]) 03/15/2017  . Hematuria 08/03/2016  . Dysuria 03/01/2015  . Type 2 diabetes mellitus with circulatory disorder ([place])   . PVC's (premature ventricular contractions)   . CAD S/P BMS PCI - pRCA, mRCA, rPAV 06/30/2012  . History of ST elevation myocardial infarction (STEMI) of inferior wall ([place]) 02/17/2012  . Hyperlipidemia associated with type 2 diabetes mellitus ([place]) 02/17/2012  . Essential hypertension 02/17/2012  . Tobacco abuse, has stopped 02/17/2012  . Allergic rhinitis 02/17/2012  . COPD with exacerbation ([place]) 05/31/2009  . OBSTRUCTIVE CHRONIC BRONCHITIS 11/13/2006  . GERD 11/13/2006    Palliative Care Assessment & Plan   Patient Profile: 73 yo with widely metastatic bladder cancer, admitted with sepsis and respiratory failure.  Assessment: [name] continues to decline and is not responding to antibiotics and medical treatment. He appears to be in significant pain and distress.   Based on my assessment today, it would be completely unrealistic to believe that medically he can recover to the degree he could ever tolerate chemotherapy or any kind of invasive treatment- he may not survive this  hospitalization.  Multiple very poor prognostic indicators:  Albumin 1.7  Creatinine 1.7-->2.2  LFTs AP high  Rising WBC  Low Hb 7.3  Metastatic Cancer w/ progression  Delirium  Sepsis-possible intraabdominal infection or liver abscessses  Large Lung Mass  Additionally, I have recognized his and his families goal is for him to see his grandchild be born -due in a month. This is a very important goal for the family but I do not believe this is realistic-or at best we should prepare them that this most likely will not occur- He is approaching EOL and is in critical condition -also he is requiring a high level of inpatient care and is incoherent/delirious.  Recommendations/Plan:  We need to have a meeting with his daughters as soon as possible- and all providers need to agree with prognosis and messaging about his condition as well as what treatments will and will not be offered now and in there future.   He needs more aggressive symptom management- he is extremely agitated and distressed on my visit today. RN administered PRN hydromorphone.   Thank you for allowing the Palliative Medicine Team to assist in the care of this patient.   Time: 35 min    Greater than 50%  of this time was spent counseling and coordinating care related to the above assessment and plan.  Lane Hacker, DO  Please contact Palliative Medicine Team phone at [phone] for questions and concerns.

## 2018-08-23 NOTE — Progress Notes (Signed)
Patient resting comfortably at this time, BIPAP not indicated.  Will continue to monitor.

## 2018-08-23 NOTE — Progress Notes (Addendum)
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  Patient presents to the ED with abdominal pain, fever and shortness of breath.  He was found to have a hemoglobin at 6 down from 10.  UA with large leukocytes more than 50 white blood cell.  Increase liver function test.  CT abdomen and pelvis show numerous hypodense liver masses which appear larger and more numerous in size from prior exam in July, concerning with liver abscess.  Mild ascites.  Right side double-J ureteral stent with mild hydronephrosis.  Patient was admitted to the stepdown unit, started on IV cefepime.  Overnight he became more tachypneic and hypoxic requiring nonrebreather mask.  patient continues to be tachypneic, using accessory muscles to breath.  Chest x-ray ordered stat, BiPAP order, IV Lasix and blood gas ordered. ID, CCM oncology consulted.  ID is recommending paracentesis and continuation of antibiotics.   Assessment & Plan:   Active Problems:   Abdominal pain  1-Acute hypoxic respiratory failure;  Patient develops worsening hypoxemia, hypercapnia overnight 8/12.  Chest x ray stable right upper lobe mass.  BIPAP PRN. He is off BIPAP this am. 2 L oxygen, mild tachypnea.  CCM consulted. Appreciate assistance. Doppler LE negative Patient is now DNR, DNI.  Continue with nebulizer.  Hold on lasix.  Stable.   2-Bladder cancer, metastatic to liver and lung; transaminases.  Discussed with Dr [name] patient is poor  prognosis, he will follow in consultation.  Prior Biopsy showed; ischemic necrosis neoplasm.  Ammonia nl.  Appreciate Dr [name] visit.   3-Sepsis; ? Liver abscess, peritonitis.  Patient presents with fever, leukocytosis;  Follow urine culture, bloo culture.  ? Superimpose infection on  liver metaststatic diseases. Continue with cefepime. ID consulted. Paracentesis ordered. Yielding 2300 WBC, neutrophil 73, consistent with peritonitis. Follow culture. No growth to date.  Appreciate Dr Baxter Flattery help.  Continue with cefepime and flagyl.  WBC trending down.   4-AKI on chronic renal failure stage III, prior cr 1.3.  Renal function worsening.  Check UA.  Discussed with urology, Dr Tresa Moore will see patient today do to worsening renal function.  Patient has been getting IV fluids, despite his renal function continue to get worse. Will do bladder scan.  Suspect obstructive uropathy from malignancy. Will repeat UA.      5-Abdominal pain; related to liver mets.  PRN dilaudid.  He is complaining of pain and nausea this am. He wants to speak with Dr.  [name]. Will contact Dr [name].   6-HTN;  Continue with coreg, norvasc, PRN hydralazine.   7-Acute blood loss anemia;  S/P transfusion 2 units PRBC. Monitor for sign of bleeding.  Hb decrease to 8.5 Follow trend. Monitor for bleeding.  Hb drop again, will transfuse if less than 7 to avoid volume overload in setting of renal failure.   8-COPD exacerbation Schedule nebulizer.   9-Oral thrush: Continue nystatin and fluconazole.   10-end of life care; I discussed with daughter, patient critical situation, worsening decline, worsening renal failure liver failure.  I expressed that we should move  forward to a more comfort care approach.  Daughter would like to speak with the rest of the family today and she is willing to meet with palliative care tomorrow.  She wants to make sure that his father pain is controlled and that if he is comfortable.  We  will change Dilaudid to every 2 hours as needed.   Estimated body mass index is 23.2 kg/m as calculated from the following:   Height as of this encounter: 5\' 4"  (1.626 m).   Weight as of this encounter: 61.3 kg.   DVT prophylaxis: scd Code Status: DNR/DNI. Discussed with Daughter  Family Communication: Daughter 43-14. Will try to speak with daughter later, patient renal function and liver function continue to get worsened. I think he is declining further.  Disposition Plan: remain in the step down Consultants:   CCM  Oncology   Procedures:   none  Antimicrobials:  cefepime  Subjective: He is alert, complaining of abdominal pain, moaning, just received pain medication.  He is asking to speak with Dr [name], he said "I have cancer liver".   Objective: Vitals:   08/23/18 0028 08/23/18 0440 08/23/18 0500 08/23/18 0602  BP: (!) 136/41 (!) 144/52 (!) 142/61 128/64  Pulse: 76 78 76 78  Resp: 17 20 15 19   Temp:    97.9 F (36.6 C)  TempSrc:    Oral  SpO2: 95% 93% 95% 94%  Weight:      Height:        Intake/Output Summary (Last 24 hours) at 08/23/2018 0706 Last data filed at 08/23/2018 0300 Gross per 24 hour  Intake 2052.62 ml  Output 350 ml  Net 1702.62 ml   Filed Weights   09/02/2018 2150 08/22/18 0437  Weight: 61.4 kg 61.3 kg    Examination:  General exam: Uncomfortable, in pain  Respiratory system: mild tachypnea, bilateral ronchus.  Cardiovascular system: S 1, S 2 RRR Gastrointestinal system: BS present, soft, distended, tender.  Central nervous system: alert   Extremities; Symmetric power.  Skin: No rashes Psychiatry: in pain.   Data Reviewed: I have personally reviewed following labs and imaging studies  CBC: Recent Labs  Lab 08/21/2018 1449 08/27/2018 1925 08/20/18 0509 08/21/18 0741 08/22/18 0201 08/23/18 0142  WBC 11.5* 12.6* 14.3* 16.5* 14.9* 11.9*  NEUTROABS 9.7*  --   --   --   --   --   HGB 6.0* 5.9* 9.2* 8.5* 8.1* 7.3*  HCT 19.4* 19.5*  28.8* 25.9* 25.1* 22.9*  MCV 78.2* 79.6* 83.5 80.2 82.0 82.7  PLT 163 158 136* 150 143* 341*   Basic Metabolic Panel: Recent Labs  Lab 08/18/2018 1449 08/20/18 0509 08/21/18 0741 08/22/18 0201 08/23/18 0142  NA 135 136 138 133* 138  K 3.4* 3.9 4.7 4.1 3.5  CL 103 106 105 103 99  CO2 22 23 15* 20* 26  GLUCOSE 170* 112* 141* 170* 202*  BUN 24* 21 46* 54* 61*  CREATININE 1.28* 1.21 1.63* 1.70* 2.15*  CALCIUM 7.6* 7.3* 7.7* 7.4* 7.4*  MG  --  1.7  --   --   --   PHOS  --  3.2  --   --   --    GFR: Estimated Creatinine Clearance: 25.6 mL/min (A) (by C-G formula based on SCr of 2.15 mg/dL (H)). Liver Function Tests: Recent Labs  Lab 08/20/2018 1449 08/20/18 0509 08/21/18 0741 08/22/18 0201 08/23/18 0142  AST 129* 148* 218* 273* 374*  ALT 67* 71* 89* 101* 117*  ALKPHOS 642*  586* 467* 411* 329*  BILITOT 2.5* 3.7* 4.0* 4.6* 5.4*  PROT 5.6* 5.5* 5.4* 5.2* 5.1*  ALBUMIN 2.1* 2.1* 1.9* 1.8* 1.7*   Recent Labs  Lab 09/01/2018 1449  LIPASE 16   Recent Labs  Lab 08/20/18 0800  AMMONIA 29   Coagulation Profile: Recent Labs  Lab 08/09/2018 1925  INR 1.4*   Cardiac Enzymes: No results for input(s): CKTOTAL, CKMB, CKMBINDEX, TROPONINI in the last 168 hours. BNP (last 3 results) No results for input(s): PROBNP in the last 8760 hours. HbA1C: No results for input(s): HGBA1C in the last 72 hours. CBG: Recent Labs  Lab 08/21/18 2116 08/22/18 0733 08/22/18 1124 08/22/18 1621 08/22/18 2131  GLUCAP 203* 202* 146* 180* 142*   Lipid Profile: No results for input(s): CHOL, HDL, LDLCALC, TRIG, CHOLHDL, LDLDIRECT in the last 72 hours. Thyroid Function Tests: No results for input(s): TSH, T4TOTAL, FREET4, T3FREE, THYROIDAB in the last 72 hours. Anemia Panel: No results for input(s): VITAMINB12, FOLATE, FERRITIN, TIBC, IRON, RETICCTPCT in the last 72 hours. Sepsis Labs: Recent Labs  Lab 08/15/2018 1454 08/20/18 0800 08/20/18 1026  LATICACIDVEN 1.7 1.6 1.4    Recent Results  (from the past 240 hour(s))  Blood culture (routine x 2)     Status: None (Preliminary result)   Collection Time: 08/25/2018  3:22 PM   Specimen: BLOOD  Result Value Ref Range Status   Specimen Description   Final    BLOOD LEFT ANTECUBITAL Performed at [facility], [place] [address], [place], [place] [address]    Special Requests   Final    BOTTLES DRAWN AEROBIC AND ANAEROBIC Blood Culture adequate volume Performed at [place] [address] [place], [place] [address]    Culture   Final    NO GROWTH 3 DAYS Performed at [facility], [place] [address] [place], [place] [address]    Report Status PENDING  Incomplete  Blood culture (routine x 2)     Status: None (Preliminary result)   Collection Time: 08/25/2018  3:22 PM   Specimen: BLOOD RIGHT HAND  Result Value Ref Range Status   Specimen Description   Final    BLOOD RIGHT HAND Performed at [place] [address] [place], [place] [address]    Special Requests   Final    BOTTLES DRAWN AEROBIC AND ANAEROBIC Blood Culture adequate volume Performed at [place] [address], [place], [place] [address]    Culture   Final    NO GROWTH 3 DAYS Performed at [facility], [place] [address], [place], [place] [address]    Report Status PENDING  Incomplete  Urine Culture     Status: Abnormal   Collection Time: 08/29/2018  3:23 PM   Specimen: Urine, Clean Catch  Result Value Ref Range Status   Specimen Description   Final    URINE, CLEAN CATCH Performed at [facility], [place] [address], [place], [place] [address]    Special Requests   Final    NONE Performed at [facility], [place] [address] [place], [place] [address]    Culture (A)  Final    <10,000 COLONIES/mL INSIGNIFICANT GROWTH Performed at [place] [address], [place], [place] [address]    Report Status 08/20/2018 FINAL  Final  SARS  CORONAVIRUS 2 Nasal Swab Aptima Multi Swab     Status: None   Collection Time: 08/18/2018  7:37 PM  Specimen: Aptima Multi Swab; Nasal Swab  Result Value Ref Range Status   SARS Coronavirus 2 NEGATIVE NEGATIVE Final    Comment: (NOTE) SARS-CoV-2 target nucleic acids are NOT DETECTED. The SARS-CoV-2 RNA is generally detectable in upper and lower respiratory specimens during the acute phase of infection. Negative results do not preclude SARS-CoV-2 infection, do not rule out co-infections with other pathogens, and should not be used as the sole basis for treatment or other patient management decisions. Negative results must be combined with clinical observations, patient history, and epidemiological information. The expected result is Negative. Fact Sheet for Patients: SugarRoll.be Fact Sheet for Healthcare Providers: https://www.woods-mathews.com/ This test is not yet approved or cleared by the [place] FDA and  has been authorized for detection and/or diagnosis of SARS-CoV-2 by FDA under an Emergency Use Authorization (EUA). This EUA will remain  in effect (meaning this test can be used) for the duration of the COVID-19 declaration under Section 56 4(b)(1) of the Act, 21 U.S.C. section 360bbb-3(b)(1), unless the authorization is terminated or revoked sooner. Performed at [facility], [place] [address] [place], [place] [address]   Culture, blood (routine x 2)     Status: None (Preliminary result)   Collection Time: 09/02/2018 10:22 PM   Specimen: BLOOD RIGHT ARM  Result Value Ref Range Status   Specimen Description   Final    BLOOD RIGHT ARM Performed at [place] [address], [place], [place] [address]    Special Requests   Final    BOTTLES DRAWN AEROBIC AND ANAEROBIC Blood Culture adequate volume Performed at [place] [address] [place], [place] [address]    Culture   Final     NO GROWTH 2 DAYS Performed at [place] [name], [place], [place] [address]    Report Status PENDING  Incomplete  Culture, blood (routine x 2)     Status: None (Preliminary result)   Collection Time: 08/21/2018 10:22 PM   Specimen: BLOOD  Result Value Ref Range Status   Specimen Description   Final    BLOOD RIGHT WRIST Performed at [place] [address], [place], [place] [address]    Special Requests   Final    BOTTLES DRAWN AEROBIC AND ANAEROBIC Blood Culture adequate volume Performed at [place] [address], [place], [place] [address]    Culture   Final    NO GROWTH 2 DAYS Performed at [place] [address] [place], [place] [address]    Report Status PENDING  Incomplete  MRSA PCR Screening     Status: None   Collection Time: 08/30/2018 10:42 PM   Specimen: Nasal Mucosa; Nasopharyngeal  Result Value Ref Range Status   MRSA by PCR NEGATIVE NEGATIVE Final    Comment:        The GeneXpert MRSA Assay (FDA approved for NASAL specimens only), is one component of a comprehensive MRSA colonization surveillance program. It is not intended to diagnose MRSA infection nor to guide or monitor treatment for MRSA infections. Performed at [facility], [place] [address] [place], [place] [address]   Culture, sputum-assessment     Status: None   Collection Time: 08/20/18  7:47 AM   Specimen: Sputum  Result Value Ref Range Status   Specimen Description SPUTUM  Final   Special Requests NONE  Final   Sputum evaluation   Final    THIS SPECIMEN  IS ACCEPTABLE FOR SPUTUM CULTURE Performed at [facility], [place] [address] [place], [place] [address]    Report Status 08/20/2018 FINAL  Final  Culture, respiratory     Status: None   Collection Time: 08/20/18  7:47 AM   Specimen: SPU  Result Value Ref Range Status   Specimen Description   Final    SPUTUM Performed at [place] [address], [place], [place] [address]    Special Requests   Final    NONE Reflexed from [id number] Performed at [facility], [place] [address], [place], [place] [address]    Gram Stain   Final    RARE WBC PRESENT, PREDOMINANTLY PMN FEW GRAM POSITIVE COCCI FEW GRAM NEGATIVE RODS    Culture   Final    FEW Consistent with normal respiratory flora. Performed at [facility], [place] [address], [place], [place] [address]    Report Status 08/22/2018 FINAL  Final  Body fluid culture     Status: None (Preliminary result)   Collection Time: 08/21/18 10:01 AM   Specimen: Paracentesis; Peritoneal Fluid  Result Value Ref Range Status   Specimen Description   Final    PARACENTESIS PERITONEAL Performed at [place] [address] [place], [place] [address]    Special Requests   Final    NONE Performed at [facility], [place] [address], [place], [place] [address]    Gram Stain   Final    FEW WBC PRESENT, PREDOMINANTLY PMN NO ORGANISMS SEEN    Culture   Final    NO GROWTH < 24 HOURS Performed at [place] [address] [place], [place] [address]    Report Status PENDING  Incomplete  Acid Fast Smear (AFB)     Status: None   Collection Time: 08/21/18 10:01 AM   Specimen: Paracentesis; Peritoneal Fluid  Result Value Ref Range Status   AFB Specimen Processing Concentration  Final   Acid Fast Smear Negative  Final    Comment: (NOTE) Performed At: [facility] [place], [place] [id number] Rush Farmer MD VO:[id number]    Source (AFB) PERITONEAL  Final    Comment: Performed at [facility], [place] [address] [place], [place] [address]         Radiology Studies: US Paracentesis  Result Date: 08/21/2018 INDICATION: Bladder cancer with metastatic disease to the liver. Ascites. Request for diagnostic and therapeutic paracentesis. EXAM: ULTRASOUND  GUIDED PARACENTESIS MEDICATIONS: 1% lidocaine is mL COMPLICATIONS: None immediate. PROCEDURE: Informed written consent was obtained from the patient after a discussion of the risks, benefits and alternatives to treatment. A timeout was performed prior to the initiation of the procedure. Initial ultrasound scanning demonstrates a small amount of ascites within the right lower abdominal quadrant. The right lower abdomen was prepped and draped in the usual sterile fashion. 1% lidocaine with epinephrine was used for local anesthesia. Following this, a 19 gauge, 7-cm, Yueh catheter was introduced. An ultrasound image was saved for documentation purposes. The paracentesis was performed. The catheter was removed and a dressing was applied. The patient tolerated the procedure well without immediate post procedural complication. FINDINGS: A total of approximately 450 mL of blood was removed. Samples were sent to the laboratory as requested by the clinical team. IMPRESSION: Successful ultrasound-guided paracentesis yielding 450 mL of blood. Read by: Gareth Eagle, PA-C Electronically Signed   By: Jacqulynn Cadet M.D.   On:  08/21/2018 10:20        Scheduled Meds: . amLODipine  10 mg Oral Daily  . aspirin EC  81 mg Oral Daily  . carvedilol  3.125 mg Oral BID  . Chlorhexidine Gluconate Cloth  6 each Topical Daily  . fluconazole  100 mg Oral Daily  . insulin aspart  0-5 Units Subcutaneous QHS  . insulin aspart  0-9 Units Subcutaneous TID WC  . ipratropium-albuterol  3 mL Nebulization Q6H  . latanoprost  1 drop Both Eyes QHS  . mouth rinse  15 mL Mouth Rinse BID  . mometasone-formoterol  2 puff Inhalation BID  . nystatin  5 mL Oral QID  . pantoprazole  40 mg Oral Q0600  . senna-docusate  1 tablet Oral BID  . tamsulosin  0.4 mg Oral Daily  . umeclidinium bromide  1 puff Inhalation Daily  . [START ON 08/25/2018] Vitamin D (Ergocalciferol)  50,000 Units Oral Weekly   Continuous Infusions: . ceFEPime  (MAXIPIME) IV Stopped (08/22/18 2124)  . metronidazole Stopped (08/23/18 0329)  . sodium bicarbonate 150 mEq in dextrose 5% 1000 mL 75 mL/hr at 08/23/18 0300     LOS: 4 days    Time spent: 35 minutes.     Elmarie Shiley, MD [facility] Pager [phone]  If 7PM-7AM, please contact night-coverage [email or web address] Password [email or web address] 08/23/2018, 7:06 AM

## 2018-08-23 NOTE — Progress Notes (Signed)
Subjective/Chief Complaint:  1 - Widely Metastatic Bladder Cancer - TURBT 5/20 with high grade urothelial carcinoma. Liver BX 07/2018 with mets. CT 08/2018 this admission with very large volume liver mets, some Rt pelvic adenopathy. Albumin <2.   2 - Rt Malignant Hydronephrosis - s/p Rt JJ stent previously for mild hydro. CT 08/17/2018 with stent in good position and no sig hydro.  3- Acute Renal Failure - Cr low 2's up from baseline of <1.5. CT this admission w/o hydro. He has widely metastatic cancer and liver dysfunction.  4 - End of Life Care - Pt has incurable widely metastatic bladder cancer with life expectancy <51mo, likely just weeks. Per history he would like to see birth of grandson.   Today "[name]" is seen for opinion on above. He normall follows with dr. [name] in our group. He is having rapid functional decline.    Objective: Vital signs in last 24 hours: Temp:  [97.9 F (36.6 C)-100.9 F (38.3 C)] 98.2 F (36.8 C) (08/15 0800) Pulse Rate:  [76-92] 78 (08/15 0602) Resp:  [12-21] 12 (08/15 0838) BP: (109-144)/(41-64) 128/58 (08/15 0838) SpO2:  [88 %-98 %] 95 % (08/15 0838) Last BM Date: 08/20/18  Intake/Output from previous day: 08/14 0701 - 08/15 0700 In: 2052.6 [I.V.:1606.1; IV Piggyback:446.6] Out: 350 [Urine:350] Intake/Output this shift: No intake/output data recorded.  General appearance: Some delirium. Few broken [name] phrases that he keeps repeating. Minimally purposeful movmenets.  Eyes: negative Nose: Nares normal. Septum midline. Mucosa normal. No drainage or sinus tenderness. Throat: lips, mucosa, and tongue normal; teeth and gums normal Back: symmetric, no curvature. ROM normal. No CVA tenderness. Resp: non-labored on room air.  Cardio: regular mild tachycardia Male genitalia: normal Extremities: Mild Rt edema Pulses: 2+ and symmetric Neurologic: Mental status: AOx1.   Abdominal ascites with fluid wave that is non tense.   Lab Results:   Recent Labs    08/22/18 0201 08/23/18 0142  WBC 14.9* 11.9*  HGB 8.1* 7.3*  HCT 25.1* 22.9*  PLT 143* 129*   BMET Recent Labs    08/22/18 0201 08/23/18 0142  NA 133* 138  K 4.1 3.5  CL 103 99  CO2 20* 26  GLUCOSE 170* 202*  BUN 54* 61*  CREATININE 1.70* 2.15*  CALCIUM 7.4* 7.4*   PT/INR No results for input(s): LABPROT, INR in the last 72 hours. ABG No results for input(s): PHART, HCO3 in the last 72 hours.  Invalid input(s): PCO2, PO2  Studies/Results: US Paracentesis  Result Date: 08/21/2018 INDICATION: Bladder cancer with metastatic disease to the liver. Ascites. Request for diagnostic and therapeutic paracentesis. EXAM: ULTRASOUND GUIDED PARACENTESIS MEDICATIONS: 1% lidocaine is mL COMPLICATIONS: None immediate. PROCEDURE: Informed written consent was obtained from the patient after a discussion of the risks, benefits and alternatives to treatment. A timeout was performed prior to the initiation of the procedure. Initial ultrasound scanning demonstrates a small amount of ascites within the right lower abdominal quadrant. The right lower abdomen was prepped and draped in the usual sterile fashion. 1% lidocaine with epinephrine was used for local anesthesia. Following this, a 19 gauge, 7-cm, Yueh catheter was introduced. An ultrasound image was saved for documentation purposes. The paracentesis was performed. The catheter was removed and a dressing was applied. The patient tolerated the procedure well without immediate post procedural complication. FINDINGS: A total of approximately 450 mL of blood was removed. Samples were sent to the laboratory as requested by the clinical team. IMPRESSION: Successful ultrasound-guided paracentesis yielding 450 mL of  blood. Read by: Gareth Eagle, PA-C Electronically Signed   By: Jacqulynn Cadet M.D.   On: 08/21/2018 10:20    Anti-infectives: Anti-infectives (From admission, onward)   Start     Dose/Rate Route Frequency Ordered Stop    08/23/18 2000  ceFEPIme (MAXIPIME) 2 g in sodium chloride 0.9 % 100 mL IVPB     2 g 200 mL/hr over 30 Minutes Intravenous Every 24 hours 08/23/18 0800     08/21/18 1800  metroNIDAZOLE (FLAGYL) IVPB 500 mg     500 mg 100 mL/hr over 60 Minutes Intravenous Every 8 hours 08/21/18 1741     08/21/18 1000  fluconazole (DIFLUCAN) tablet 100 mg     100 mg Oral Daily 08/21/18 0817     08/20/18 1000  ceFEPIme (MAXIPIME) 2 g in sodium chloride 0.9 % 100 mL IVPB  Status:  Discontinued     2 g 200 mL/hr over 30 Minutes Intravenous Every 12 hours 08/27/2018 2114 08/23/18 0800   08/17/2018 2015  ceFEPIme (MAXIPIME) 2 g in sodium chloride 0.9 % 100 mL IVPB     2 g 200 mL/hr over 30 Minutes Intravenous STAT 08/11/2018 2011 08/20/18 0731   09/07/2018 1645  cefTRIAXone (ROCEPHIN) 1 g in sodium chloride 0.9 % 100 mL IVPB     1 g 200 mL/hr over 30 Minutes Intravenous  Once 08/14/2018 1642 08/28/2018 1745      Assessment/Plan:  1 - Widely Metastatic Bladder Cancer - prognosis grave. No role for cancer specific therapy. Strongly rec hospice / palliative transition.   2 - Rt Malignant Hydronephrosis - current stent in adequate position. He if very risk operative candidate. I see no role for stent change.   3- Acute Renal Failure - likely multifactorial but suspect central is pre-renal and hepato-renal. Imaging this admission w/o signs of significant GU obstruction.   4 - End of Life Care - Again rec consider palliative only transition. I feel his life expectency is quite limited regardless of therapies. Certainly <6MA, perhaps just days-weeks.    Alexis Frock 08/23/2018

## 2018-08-24 DIAGNOSIS — C799 Secondary malignant neoplasm of unspecified site: Secondary | ICD-10-CM

## 2018-08-24 DIAGNOSIS — Z515 Encounter for palliative care: Secondary | ICD-10-CM

## 2018-08-24 DIAGNOSIS — R509 Fever, unspecified: Secondary | ICD-10-CM

## 2018-08-24 LAB — GLUCOSE, CAPILLARY
Glucose-Capillary: 77 mg/dL (ref 70–99)
Glucose-Capillary: 73 mg/dL (ref 70–99)
Glucose-Capillary: 83 mg/dL (ref 70–99)
Glucose-Capillary: 136 mg/dL — ABNORMAL HIGH (ref 70–99)

## 2018-08-24 LAB — COMPREHENSIVE METABOLIC PANEL
ALT: 166 U/L — ABNORMAL HIGH (ref 0–44)
AST: 836 U/L — ABNORMAL HIGH (ref 15–41)
Albumin: 1.6 g/dL — ABNORMAL LOW (ref 3.5–5.0)
Alkaline Phosphatase: 287 U/L — ABNORMAL HIGH (ref 38–126)
Anion gap: 14 (ref 5–15)
BUN: 62 mg/dL — ABNORMAL HIGH (ref 8–23)
CO2: 23 mmol/L (ref 22–32)
Calcium: 7.3 mg/dL — ABNORMAL LOW (ref 8.9–10.3)
Chloride: 101 mmol/L (ref 98–111)
Creatinine, Ser: 2.84 mg/dL — ABNORMAL HIGH (ref 0.61–1.24)
GFR calc Af Amer: 24 mL/min — ABNORMAL LOW (ref >60)
GFR calc non Af Amer: 21 mL/min — ABNORMAL LOW (ref >60)
Glucose, Bld: 76 mg/dL (ref 70–99)
Potassium: 4.6 mmol/L (ref 3.5–5.1)
Sodium: 138 mmol/L (ref 135–145)
Total Bilirubin: 5.9 mg/dL — ABNORMAL HIGH (ref 0.3–1.2)
Total Protein: 4.7 g/dL — ABNORMAL LOW (ref 6.5–8.1)

## 2018-08-24 LAB — CBC
HCT: 23.5 % — ABNORMAL LOW (ref 39.0–52.0)
Hemoglobin: 7.2 g/dL — ABNORMAL LOW (ref 13.0–17.0)
MCH: 26.2 pg (ref 26.0–34.0)
MCHC: 30.6 g/dL (ref 30.0–36.0)
MCV: 85.5 fL (ref 80.0–100.0)
Platelets: 144 10*3/uL — ABNORMAL LOW (ref 150–400)
RBC: 2.75 MIL/uL — ABNORMAL LOW (ref 4.22–5.81)
RDW: 20.6 % — ABNORMAL HIGH (ref 11.5–15.5)
WBC: 14.8 10*3/uL — ABNORMAL HIGH (ref 4.0–10.5)
nRBC: 0.3 % — ABNORMAL HIGH (ref 0.0–0.2)

## 2018-08-24 LAB — CULTURE, BLOOD (ROUTINE X 2)
Culture: NO GROWTH
Culture: NO GROWTH
Special Requests: ADEQUATE
Special Requests: ADEQUATE

## 2018-08-24 LAB — BODY FLUID CULTURE: Culture: NO GROWTH

## 2018-08-24 MED ORDER — HALOPERIDOL 1 MG PO TABS: 0.5000 mg | ORAL_TABLET | ORAL | Status: DC | PRN

## 2018-08-24 MED ORDER — HALOPERIDOL LACTATE 2 MG/ML PO CONC
0.5000 mg | ORAL | Status: DC | PRN
  Filled 2018-08-24: qty 0.3

## 2018-08-24 MED ORDER — DEXTROSE-NACL 5-0.9 % IV SOLN
INTRAVENOUS | Status: DC
  Administered 2018-08-24: 09:00:00 via INTRAVENOUS

## 2018-08-24 MED ORDER — GLYCOPYRROLATE 1 MG PO TABS: 1.0000 mg | ORAL_TABLET | ORAL | Status: DC | PRN

## 2018-08-24 MED ORDER — SODIUM CHLORIDE 0.9 % IV BOLUS
500.0000 mL | Freq: Once | INTRAVENOUS | Status: AC
  Administered 2018-08-24: 500 mL via INTRAVENOUS

## 2018-08-24 MED ORDER — HALOPERIDOL LACTATE 5 MG/ML IJ SOLN: 0.5000 mg | INTRAMUSCULAR | Status: DC | PRN

## 2018-08-24 MED ORDER — GLYCOPYRROLATE 0.2 MG/ML IJ SOLN: 0.2000 mg | INTRAMUSCULAR | Status: DC | PRN

## 2018-08-24 MED ORDER — SODIUM CHLORIDE 0.9 % IV BOLUS
250.0000 mL | Freq: Once | INTRAVENOUS | Status: AC
  Administered 2018-08-24: 11:00:00 250 mL via INTRAVENOUS

## 2018-08-24 MED ORDER — HYDROMORPHONE HCL 1 MG/ML IJ SOLN: 0.5000 mg | INTRAMUSCULAR | Status: DC | PRN

## 2018-08-24 MED ORDER — BIOTENE DRY MOUTH MT LIQD: 15.0000 mL | OROMUCOSAL | Status: DC | PRN

## 2018-08-24 MED ORDER — POLYVINYL ALCOHOL 1.4 % OP SOLN
1.0000 [drp] | Freq: Four times a day (QID) | OPHTHALMIC | Status: DC | PRN
  Filled 2018-08-24: qty 15

## 2018-08-25 LAB — CULTURE, BLOOD (ROUTINE X 2)
Culture: NO GROWTH
Culture: NO GROWTH
Special Requests: ADEQUATE
Special Requests: ADEQUATE

## 2018-08-25 NOTE — Progress Notes (Signed)
Family left at [place], [place] donor service was called, referral number recorded in chart. Pt family did not have a designated funeral home at this time. Before leaving family asked that we do something to keep pt mouth closed, and to keep him covered because they didn't want him to be naked, and to keep his socks on. RN will put plastic gown on him, and keep the socks on. RN called [facility] and they recommended taping pt mouth shut. Family also wants for his wedding band to stay on.

## 2018-09-09 NOTE — Progress Notes (Signed)
Daily Progress Note   Patient Name: [name]       Date: September 14, 2018 DOB: 1945-04-04  Age: 73 y.o. MRN#: [id number] Attending Physician: Elmarie Shiley, MD Primary Care Physician: [name], MD Admit Date: 08/23/2018  Reason for Consultation/Follow-up: Establishing goals of care, Non pain symptom management, Pain control and Psychosocial/spiritual support  Subjective: I received a call from the RN this am.  Dr. Tyrell Antonio has seen the patient this morning.  His presentation is worse.  His labs are worse.  She is concerned and would like for the family to be close to him.  She requests that the [place] meeting scheduled for 2:00 pm be held earlier if possible.  I assessed the patient.  He startles awake to my voice.  He alternately throws his arms around in the bed and groans - then he smiles and holds his hands in prayer position.  He does this repeatedly.  Family gathered in the afternoon including wife, daughters [name] and [name]), sons [name] and [name]).  We spoke at bedside and then in a private conference room.  The questions in the room varied widely from individual to individual.  We discussed what cancer is and what [name] course has been.  We then talked about his current hospitalization. I explained that while we are treating him for an infection - so far the blood work, cultures, and microscopy have not shown any infection.  I believe his illness is purely due to rapid progression of metastatic bladder cancer.  Many questions were answered.  We discussed a prognosis of hours to days and shifting to comfort measures.  The family is in agreement with focusing on his comfort.  They ask that we attempt to keep him alert as possible as long as he is not suffering.   If we are fortunate and he  stabilizes perhaps he will move to a floor bed.  We will reassess day by day.   Assessment: 73 yo gentleman with metastatic bladder cancer suffering with delirium from possible bacterial peritonitis.  Clinical presentation, renal and hepatic labs are worsening despite ICU treatment.  He appears to be nearing end of life.   Patient Profile/HPI:  73 y.o. 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  Palliative consulted for [name]   Length of Stay: 5  Current Medications: Scheduled Meds:  . aspirin EC  81 mg Oral Daily  . carvedilol  3.125 mg Oral BID  . Chlorhexidine Gluconate Cloth  6 each Topical Daily  . fentaNYL  1 patch Transdermal Q72H  . fluconazole  100 mg Oral Daily  . insulin aspart  0-5 Units Subcutaneous QHS  . insulin aspart  0-9 Units Subcutaneous TID WC  . Ipratropium-Albuterol  1 puff Inhalation Q6H  . latanoprost  1 drop Both Eyes QHS  . mouth rinse  15 mL Mouth Rinse BID  . mometasone-formoterol  2 puff Inhalation BID  . nystatin  5 mL Oral QID  . pantoprazole  40 mg Oral Q0600  . senna-docusate  1 tablet Oral BID  . tamsulosin  0.4 mg Oral Daily  . umeclidinium bromide  1 puff Inhalation Daily  . [START ON 08/25/2018] Vitamin D (Ergocalciferol)  50,000 Units Oral Weekly    Continuous Infusions: . ceFEPime (MAXIPIME) IV Stopped (08/23/18 2053)  . dextrose 5 % and 0.9% NaCl    . metronidazole Stopped (09/03/2018 0205)    PRN Meds: albuterol, HYDROmorphone (DILAUDID) injection, ondansetron (ZOFRAN) IV  Physical Exam        Well developed male. Awake, agitated, moaning and throwing himself from side to side CV - unable to listen due to patient agitation Resp no apparent distress. Abdomen soft, non-distended Extremities 1+ edema.  Vital Signs: BP (!) 88/43   Pulse 88   Temp 99.1 F (37.3 C) (Oral)   Resp 18   Ht 5\' 4"  (1.626 m) Comment: per patient  Wt 61.3  kg   SpO2 94%   BMI 23.20 kg/m  SpO2: SpO2: 94 % O2 Device: O2 Device: High Flow Nasal Cannula O2 Flow Rate: O2 Flow Rate (L/min): 5 L/min  Intake/output summary:   Intake/Output Summary (Last 24 hours) at 2018/09/03 0849 Last data filed at 03-Sep-2018 0600 Gross per 24 hour  Intake 1254.58 ml  Output 200 ml  Net 1054.58 ml   LBM: Last BM Date: 08/20/18 Baseline Weight: Weight: 61.4 kg Most recent weight: Weight: 61.3 kg       Palliative Assessment/Data: 20%      Patient Active Problem List   Diagnosis Date Noted  . Abdominal pain 08/28/2018  . Acute cystitis with hematuria   . Metastatic disease ([place])   . Sepsis due to gram-negative UTI ([place]) 08/01/2018  . Liver mass, right lobe 08/01/2018  . Bladder cancer ([place]) 06/06/2018  . Pulmonary nodules 06/04/2018  . COPD GOLD II  06/03/2018  . Preop cardiovascular exam 04/27/2018  . 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 ([place]) 03/15/2017  . Hematuria 08/03/2016  . Dysuria 03/01/2015  . Type 2 diabetes mellitus with circulatory disorder ([place])   . PVC's (premature ventricular contractions)   . CAD S/P BMS PCI - pRCA, mRCA, rPAV 06/30/2012  . History of ST elevation myocardial infarction (STEMI) of inferior wall ([place]) 02/17/2012  . Hyperlipidemia associated with type 2 diabetes mellitus ([place]) 02/17/2012  . Essential hypertension 02/17/2012  . Tobacco abuse, has stopped 02/17/2012  . Allergic rhinitis 02/17/2012  . COPD with exacerbation ([place]) 05/31/2009  . OBSTRUCTIVE CHRONIC BRONCHITIS 11/13/2006  . GERD 11/13/2006    Palliative Care Plan    Recommendations/Plan:  Shift to comfort measures.  Orders adjusted.  Family requests that we keep him as alert as possible as long as he is not suffering.  Please lift  visiting restrictions for family (allow 4 people in).  Family needs to remain at bedside to help [place] interpret what he needs as he is delirious and speaks vietnamese.  PMT will reassess daily to ensure  comfort and determine if he is eligible to leave the hospital for Hospice house.  Goals of Care and Additional Recommendations:  Limitations on Scope of Treatment: Full Comfort Care  Code Status:  DNR  Prognosis:   Hours - Days   Discharge Planning:  Anticipated Hospital Death.  If he stabilizes we will attempt to get him to hospice house.  Care plan was discussed with family, RN and attending MD  Thank you for allowing the Palliative Medicine Team to assist in the care of this patient.  Total time spent:  2 hours. Time in 12:20 Time out 2:20 pm     Greater than 50%  of this time was spent counseling and coordinating care related to the above assessment and plan.  Florentina Jenny, PA-C Palliative Medicine  Please contact Palliative MedicineTeam phone at [phone] for questions and concerns between 7 am - 7 pm.   Please see AMION for individual provider pager numbers.

## 2018-09-09 NOTE — Discharge Summary (Signed)
Death Summary  [name] ZDG:[id number] DOB: 06-07-1945 DOA: 2018/09/05  PCP: [name], MD PCP/Office notified:   Admit date: September 05, 2018 Date of Death: 2018-09-10    Final Diagnoses:    Bladder cancer, metastatic to liver and lung; transaminases.    Sepsis secondary to peritonitis, presume liver abscess on metastasis to  Liver.   AKI   Acute blood loss anemia   COPD   Acute hypoxic Respiratory Failure   Metastatic cancer (HCC)   Fever   Palliative care encounter   Comfort measures only status     History of present illness:  73 year old with past medical history significant for diabetes, bladder cancer with metastasis to the liver and lung, coronary artery disease, history of ST segment elevation MI status post angioplasty, chronic kidney disease stage III, COPD who was recently discharged from the hospital on July 29 after inpatient care for UTI, sepsis bladder cancer with new mets to liver status post liver biopsy revealing neoplastic ischemic necrosis.  Patient presents to the ED with abdominal pain, fever and shortness of breath.  He was found to have a hemoglobin at 6 down from 10.  UA with large leukocytes more than 50 white blood cell.  Increase liver function test.  CT abdomen and pelvis show numerous hypodense liver masses which appear larger and more numerous in size from prior exam in July, concerning with liver abscess.  Mild ascites.  Right side double-J ureteral stent with mild hydronephrosis.  Patient was admitted to the stepdown unit, started on IV cefepime.  Overnight he became more tachypneic and hypoxic requiring nonrebreather mask.  patient continues to be tachypneic, using accessory muscles to breath.  Chest x-ray ordered stat, BiPAP order, IV Lasix and blood gas ordered. ID, CCM oncology consulted.  ID is recommending paracentesis and continuation of antibiotics.  Patient was treated aggressively with fluids, IV antibiotics, underwent paracentesis. Despite  aggressive care patient continue to decline. Family met with palliative care and decision was made to focus on comfort care.  Hospital Course:   1-Acute hypoxic respiratory failure;  Patient develops worsening hypoxemia, hypercapnia overnight 8/12.  Chest x ray stable right upper lobe mass.  BIPAP PRN. He is off BIPAP this am. 2 L oxygen, mild tachypnea.  CCM consulted. Appreciate assistance. Doppler LE negative Patient is now DNR, DNI.  Continue with nebulizer.  Patient requiring 5 L oxygen, unable to give him lasix due to low blood pressure.   2-Bladder cancer, metastatic to liver and lung; transaminases.  Discussed with Dr [name] patient has  poor prognosis.  Prior Biopsy showed; ischemic necrosis neoplasm.  Ammonia nl.  Appreciate Urology evaluation, no need for stent exchange.   3-Sepsis; ? Liver abscess, peritonitis.  Patient presents with fever, leukocytosis;  Follow urine culture, bloo culture.  ? Superimpose infection on  liver metaststatic diseases. Continue with cefepime. ID consulted. Paracentesis ordered. Yielding 2300 WBC, neutrophil 73, consistent with peritonitis. Follow culture. No growth to date.  Appreciate Dr Baxter Flattery help.  Continue with cefepime and flagyl.  WBC increasing, BP low this am. IV bolus ordered.  Patient continue to decline, liver function is worse, WBC increasing, renal function is worse also. Family met with palliative decision was made to focus on comfort care.    4-AKI on chronic renal failure stage III, prior cr 1.3.  Renal function continue to get worse on IV fluids.   5-Abdominal pain; related to liver mets.  PRN dilaudid.  Appreciate Dr [name] evaluation, patient is not candidate for chemo, poor  prognosis. We should focus on comfort.   6-HTN;  Hold  coreg, norvasc in the setting of hypotension.  7-Acute blood loss anemia;  S/P transfusion 2 units PRBC. Monitor for sign of bleeding.  Hb decrease to 8.5 Follow trend. Monitor for  bleeding.  Hb drop again,  transfuse if less than 7 to avoid volume overload in setting of renal failure.   8-COPD exacerbation Schedule nebulizer.   9-Oral thrush: Continue nystatin and fluconazole.    Time: 21;51  Signed:  Belkys A Regalado  [facility] 08/26/2018, 5:23 PM

## 2018-09-09 NOTE — Progress Notes (Signed)
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  Patient presents to the ED with abdominal pain, fever and shortness of breath.  He was found to have a hemoglobin at 6 down from 10.  UA with large leukocytes more than 50 white blood cell.  Increase liver function test.  CT abdomen and pelvis show numerous hypodense liver masses which appear larger and more numerous in size from prior exam in July, concerning with liver abscess.  Mild ascites.  Right side double-J ureteral stent with mild hydronephrosis.  Patient was admitted to the stepdown unit, started on IV cefepime.  Overnight he became more tachypneic and hypoxic requiring nonrebreather mask.  patient continues to be tachypneic, using accessory muscles to breath.  Chest x-ray ordered stat, BiPAP order, IV Lasix and blood gas ordered. ID, CCM oncology consulted.  ID is recommending paracentesis and continuation of antibiotics.   Assessment & Plan:   Active Problems:   Abdominal pain  1-Acute hypoxic respiratory failure;  Patient develops worsening hypoxemia, hypercapnia overnight 8/12.  Chest x ray stable right upper lobe mass.  BIPAP PRN. He is off BIPAP this am. 2 L oxygen, mild tachypnea.  CCM consulted. Appreciate assistance. Doppler LE negative Patient is now DNR, DNI.  Continue with nebulizer.  Patient requiring 5 L oxygen, unable to give him lasix due to low blood pressure.   2-Bladder cancer, metastatic to liver and lung;  transaminases.  Discussed with Dr [name] patient has  poor prognosis.  Prior Biopsy showed; ischemic necrosis neoplasm.  Ammonia nl.  Appreciate Urology evaluation, no need for stent exchange.   3-Sepsis; ? Liver abscess, peritonitis.  Patient presents with fever, leukocytosis;  Follow urine culture, bloo culture.  ? Superimpose infection on  liver metaststatic diseases. Continue with cefepime. ID consulted. Paracentesis ordered. Yielding 2300 WBC, neutrophil 73, consistent with peritonitis. Follow culture. No growth to date.  Appreciate Dr Baxter Flattery help.  Continue with cefepime and flagyl.  WBC increasing, BP low this am. IV bolus ordered.  Patient continue to decline, liver function is worse, WBC increasing, renal function is worse also.  Plan for palliative care meeting today.   4-AKI on chronic renal failure stage III, prior cr 1.3.  Renal function continue to get worse on IV fluids.   5-Abdominal pain; related to liver mets.  PRN dilaudid.  Appreciate Dr [name] evaluation, patient is not candidate for chemo, poor prognosis. We should focus on comfort.   6-HTN;  Hold  coreg, norvasc in the setting of hypotension.  7-Acute blood loss anemia;  S/P transfusion 2 units PRBC. Monitor for sign of bleeding.  Hb decrease to 8.5 Follow trend. Monitor for bleeding.  Hb drop again, will transfuse if less than 7 to avoid volume overload in setting of renal failure.   8-COPD exacerbation Schedule nebulizer.   9-Oral thrush: Continue nystatin and fluconazole.   10-end of life care; Family meeting today for goals of care.    Estimated body mass index is 23.2 kg/m as calculated from the following:   Height as of  this encounter: 5\' 4"  (1.626 m).   Weight as of this encounter: 61.3 kg.   DVT prophylaxis: scd Code Status: DNR/DNI. Discussed with Daughter  Family Communication: Daughter 40-15 Disposition Plan: remain in the step down Consultants:   CCM  Oncology   Procedures:    none  Antimicrobials:  cefepime  Subjective: Sleepy, appears comfortable.    Objective: Vitals:   06-Sep-2018 0600 09-06-2018 0700 09/06/2018 0702 06-Sep-2018 0710  BP: (!) 90/44  (S) (!) 88/43   Pulse: 85 82 85   Resp: 18 19 (!) 23   Temp:    99.1 F (37.3 C)  TempSrc:    Oral  SpO2: 97% 95% 95%   Weight:      Height:        Intake/Output Summary (Last 24 hours) at September 06, 2018 0711 Last data filed at 2018/09/06 0600 Gross per 24 hour  Intake 1254.58 ml  Output 200 ml  Net 1054.58 ml   Filed Weights   08/29/2018 2150 08/22/18 0437  Weight: 61.4 kg 61.3 kg    Examination:  General exam: sleepy, appears comfortable.  Respiratory system: tachypnea.  Cardiovascular system; S 1, S 2 RRR Gastrointestinal system: BS present., distended.  Central nervous system: Sleepy Extremities; trace edema Skin: no rashes.   Data Reviewed: I have personally reviewed following labs and imaging studies  CBC: Recent Labs  Lab 08/30/2018 1449  08/20/18 0509 08/21/18 0741 08/22/18 0201 08/23/18 0142 09-06-2018 0446  WBC 11.5*   < > 14.3* 16.5* 14.9* 11.9* 14.8*  NEUTROABS 9.7*  --   --   --   --   --   --   HGB 6.0*   < > 9.2* 8.5* 8.1* 7.3* 7.2*  HCT 19.4*   < > 28.8* 25.9* 25.1* 22.9* 23.5*  MCV 78.2*   < > 83.5 80.2 82.0 82.7 85.5  PLT 163   < > 136* 150 143* 129* 144*   < > = values in this interval not displayed.   Basic Metabolic Panel: Recent Labs  Lab 08/20/18 0509 08/21/18 0741 08/22/18 0201 08/23/18 0142 2018-09-06 0446  NA 136 138 133* 138 138  K 3.9 4.7 4.1 3.5 4.6  CL 106 105 103 99 101  CO2 23 15* 20* 26 23  GLUCOSE 112* 141* 170* 202* 76  BUN 21 46* 54* 61* 62*  CREATININE 1.21 1.63* 1.70* 2.15* 2.84*  CALCIUM 7.3* 7.7* 7.4* 7.4* 7.3*  MG 1.7  --   --   --   --   PHOS 3.2  --   --   --   --    GFR: Estimated Creatinine Clearance: 19.4 mL/min (A) (by C-G formula based on SCr of 2.84 mg/dL (H)). Liver Function Tests: Recent Labs  Lab 08/20/18 0509 08/21/18  0741 08/22/18 0201 08/23/18 0142 06-Sep-2018 0446  AST 148* 218* 273* 374* 836*  ALT 71* 89* 101* 117* 166*  ALKPHOS 586* 467* 411* 329* 287*  BILITOT 3.7* 4.0* 4.6* 5.4* 5.9*  PROT 5.5* 5.4* 5.2* 5.1* 4.7*  ALBUMIN 2.1* 1.9* 1.8* 1.7* 1.6*   Recent Labs  Lab 09/06/2018 1449  LIPASE 16   Recent Labs  Lab 08/20/18 0800  AMMONIA 29   Coagulation Profile: Recent Labs  Lab 08/23/2018 1925  INR 1.4*   Cardiac Enzymes: No results for input(s): CKTOTAL, CKMB, CKMBINDEX, TROPONINI in the last 168 hours. BNP (last 3 results) No results for input(s): PROBNP in the last 8760 hours. HbA1C: No results for input(s): HGBA1C in the last  72 hours. CBG: Recent Labs  Lab 08/22/18 2131 08/23/18 0728 08/23/18 1134 08/23/18 1735 08/23/18 2150  GLUCAP 142* 150* 122* 81 76   Lipid Profile: No results for input(s): CHOL, HDL, LDLCALC, TRIG, CHOLHDL, LDLDIRECT in the last 72 hours. Thyroid Function Tests: No results for input(s): TSH, T4TOTAL, FREET4, T3FREE, THYROIDAB in the last 72 hours. Anemia Panel: No results for input(s): VITAMINB12, FOLATE, FERRITIN, TIBC, IRON, RETICCTPCT in the last 72 hours. Sepsis Labs: Recent Labs  Lab 08/28/2018 1454 08/20/18 0800 08/20/18 1026  LATICACIDVEN 1.7 1.6 1.4    Recent Results (from the past 240 hour(s))  Blood culture (routine x 2)     Status: None (Preliminary result)   Collection Time: 09/06/2018  3:22 PM   Specimen: BLOOD  Result Value Ref Range Status   Specimen Description   Final    BLOOD LEFT ANTECUBITAL Performed at [facility], [place] [address], [place], [place] [address]    Special Requests   Final    BOTTLES DRAWN AEROBIC AND ANAEROBIC Blood Culture adequate volume Performed at [place] [address] [place], [place] [address]    Culture   Final    NO GROWTH 4 DAYS Performed at [facility], [place] [address], [place], [place] [address]    Report Status PENDING  Incomplete   Blood culture (routine x 2)     Status: None (Preliminary result)   Collection Time: 08/29/2018  3:22 PM   Specimen: BLOOD RIGHT HAND  Result Value Ref Range Status   Specimen Description   Final    BLOOD RIGHT HAND Performed at [facility] [address] [place], [place] [address]    Special Requests   Final    BOTTLES DRAWN AEROBIC AND ANAEROBIC Blood Culture adequate volume Performed at [place] [address], [place], [place] [address]    Culture   Final    NO GROWTH 4 DAYS Performed at [facility], [place] [address] [place], [place] [address]    Report Status PENDING  Incomplete  Urine Culture     Status: Abnormal   Collection Time: 08/14/2018  3:23 PM   Specimen: Urine, Clean Catch  Result Value Ref Range Status   Specimen Description   Final    URINE, CLEAN CATCH Performed at [facility], [place] [address], [place], [place] [address]    Special Requests   Final    NONE Performed at [facility], [place] [address] [place], [place] [address]    Culture (A)  Final    <10,000 COLONIES/mL INSIGNIFICANT GROWTH Performed at [place] [address] [place],  [address]    Report Status 08/20/2018 FINAL  Final  SARS CORONAVIRUS 2 Nasal Swab Aptima Multi Swab     Status: None   Collection Time: 09/07/2018  7:37 PM   Specimen: Aptima Multi Swab; Nasal Swab  Result Value Ref Range Status   SARS Coronavirus 2 NEGATIVE NEGATIVE Final    Comment: (NOTE) SARS-CoV-2 target nucleic acids are NOT DETECTED. The SARS-CoV-2 RNA is generally detectable in upper and lower respiratory specimens during the acute phase of infection. Negative results do not preclude SARS-CoV-2 infection, do not rule out co-infections with other pathogens, and should not be used as the sole basis for treatment or other patient management decisions. Negative results must be combined with clinical observations,  patient history, and epidemiological information. The expected result is Negative.  Fact Sheet for Patients: SugarRoll.be Fact Sheet for Healthcare Providers: https://www.woods-mathews.com/ This test is not yet approved or cleared by the [place] FDA and  has been authorized for detection and/or diagnosis of SARS-CoV-2 by FDA under an Emergency Use Authorization (EUA). This EUA will remain  in effect (meaning this test can be used) for the duration of the COVID-19 declaration under Section 56 4(b)(1) of the Act, 21 U.S.C. section 360bbb-3(b)(1), unless the authorization is terminated or revoked sooner. Performed at [facility], [place] [address], [place], [place] [address]   Culture, blood (routine x 2)     Status: None (Preliminary result)   Collection Time: 09/03/2018 10:22 PM   Specimen: BLOOD RIGHT ARM  Result Value Ref Range Status   Specimen Description   Final    BLOOD RIGHT ARM Performed at [facility] [address], [place], [place] [address]    Special Requests   Final    BOTTLES DRAWN AEROBIC AND ANAEROBIC Blood Culture adequate volume Performed at [place] [address], [place], [place] [address]    Culture   Final    NO GROWTH 3 DAYS Performed at [facility], [place] [address], [place], [place] [address]    Report Status PENDING  Incomplete  Culture, blood (routine x 2)     Status: None (Preliminary result)   Collection Time: 08/26/2018 10:22 PM   Specimen: BLOOD  Result Value Ref Range Status   Specimen Description   Final    BLOOD RIGHT WRIST Performed at [place] [address] [place], [place] [address]    Special Requests   Final    BOTTLES DRAWN AEROBIC AND ANAEROBIC Blood Culture adequate volume Performed at [place] [address], [place], [place] [address]    Culture   Final    NO GROWTH 3 DAYS  Performed at [facility], [place] [address], [place], [place] [address]    Report Status PENDING  Incomplete  MRSA PCR Screening     Status: None   Collection Time: 08/29/2018 10:42 PM   Specimen: Nasal Mucosa; Nasopharyngeal  Result Value Ref Range Status   MRSA by PCR NEGATIVE NEGATIVE Final    Comment:        The GeneXpert MRSA Assay (FDA approved for NASAL specimens only), is one component of a comprehensive MRSA colonization surveillance program. It is not intended to diagnose MRSA infection nor to guide or monitor treatment for MRSA infections. Performed at [facility], [place] [address], [place], [place] [address]   Culture, sputum-assessment     Status: None   Collection Time: 08/20/18  7:47 AM   Specimen: Sputum  Result Value Ref Range Status   Specimen Description SPUTUM  Final   Special Requests NONE  Final   Sputum evaluation   Final    THIS SPECIMEN IS ACCEPTABLE FOR SPUTUM CULTURE Performed at [facility], [place] [address], [place], [place] [address]    Report Status 08/20/2018 FINAL  Final  Culture, respiratory     Status: None   Collection Time: 08/20/18  7:47 AM   Specimen: SPU  Result Value Ref Range Status   Specimen Description   Final    SPUTUM Performed at [place] [address], [place], [place] [address]    Special Requests   Final    NONE Reflexed from [id number] Performed at [facility],  [place] [address], [place], [place] [address]    Gram Stain   Final    RARE WBC PRESENT, PREDOMINANTLY PMN FEW GRAM POSITIVE COCCI FEW GRAM NEGATIVE RODS    Culture   Final    FEW Consistent with normal respiratory flora. Performed at [facility], [place] [address], [place], [place] [address]    Report Status 08/22/2018 FINAL  Final  Body fluid culture     Status: None (Preliminary result)   Collection Time: 08/21/18 10:01 AM   Specimen: Paracentesis; Peritoneal Fluid   Result Value Ref Range Status   Specimen Description   Final    PARACENTESIS PERITONEAL Performed at [place] [address] [place], [place] [address]    Special Requests   Final    NONE Performed at [facility], [place] [address], [place], [place] [address]    Gram Stain   Final    FEW WBC PRESENT, PREDOMINANTLY PMN NO ORGANISMS SEEN    Culture   Final    NO GROWTH 2 DAYS Performed at [place] [address] [place], [place] [address]    Report Status PENDING  Incomplete  Acid Fast Smear (AFB)     Status: None   Collection Time: 08/21/18 10:01 AM   Specimen: Paracentesis; Peritoneal Fluid  Result Value Ref Range Status   AFB Specimen Processing Concentration  Final   Acid Fast Smear Negative  Final    Comment: (NOTE) Performed At: [facility] [place], [place] [id number] Rush Farmer MD NA:[id number]    Source (AFB) PERITONEAL  Final    Comment: Performed at [facility], [place] [address] [place], [place] [address]         Radiology Studies: No results found.      Scheduled Meds: . aspirin EC  81 mg Oral Daily  . carvedilol  3.125 mg Oral BID  . Chlorhexidine Gluconate Cloth  6 each Topical Daily  . fentaNYL  1 patch Transdermal Q72H  . fluconazole  100 mg Oral Daily  . insulin aspart  0-5 Units Subcutaneous QHS  . insulin aspart  0-9 Units Subcutaneous TID WC  . Ipratropium-Albuterol  1 puff Inhalation Q6H  . latanoprost  1 drop Both Eyes QHS  . mouth rinse  15 mL Mouth Rinse BID  . mometasone-formoterol  2 puff Inhalation BID  . nystatin  5 mL Oral QID  . pantoprazole  40 mg Oral Q0600  . senna-docusate  1 tablet Oral BID  . tamsulosin  0.4 mg Oral Daily  . umeclidinium bromide  1 puff Inhalation Daily  . [START ON 08/25/2018] Vitamin D (Ergocalciferol)  50,000 Units Oral Weekly   Continuous Infusions: . ceFEPime (MAXIPIME) IV Stopped (08/23/18 2053)  .  dextrose 5 % and 0.9% NaCl    . metronidazole Stopped (09-03-2018 0205)     LOS: 5 days    Time spent: 35 minutes.     Elmarie Shiley, MD [facility] Pager [phone]  If 7PM-7AM, please contact night-coverage [email or web address] Password [email or web address] 03-Sep-2018, 7:11 AM

## 2018-09-09 NOTE — Progress Notes (Signed)
Pt passed at 2150, wife at bedside. Jennye Moccasin RN dual verified. RN called daughter [name] to make her aware, family is on their way to hospital. Will make MD aware.

## 2018-09-09 DEATH — deceased

## 2018-10-04 LAB — ACID FAST CULTURE WITH REFLEXED SENSITIVITIES (MYCOBACTERIA): Acid Fast Culture: NEGATIVE

## 2018-10-14 ENCOUNTER — Other Ambulatory Visit: Payer: Medicare HMO

## 2018-10-16 ENCOUNTER — Ambulatory Visit: Payer: Medicare HMO

## 2018-10-16 ENCOUNTER — Ambulatory Visit: Payer: Medicare HMO | Admitting: Oncology

## 2018-10-29 LAB — BLOOD GAS, ARTERIAL
Acid-base deficit: 1.9 mmol/L (ref 0.0–2.0)
Bicarbonate: 20.1 mmol/L (ref 20.0–28.0)
Delivery systems: POSITIVE
Drawn by: 270211
Expiratory PAP: 5
FIO2: 1
Inspiratory PAP: 10
Mode: POSITIVE
O2 Saturation: 99.9 %
Patient temperature: 98.6
pCO2 arterial: 26.2 mmHg — ABNORMAL LOW (ref 32.0–48.0)
pH, Arterial: 7.497 — ABNORMAL HIGH (ref 7.350–7.450)
pO2, Arterial: 443 mmHg — ABNORMAL HIGH (ref 83.0–108.0)

## 2019-07-16 IMAGING — DX DG ABDOMEN 1V
1 series · 1 of 1 positions shown · non-contrast
Comparison: None.

CLINICAL DATA: Sepsis.

EXAM:
ABDOMEN - 1 VIEW

[abdomen kub]
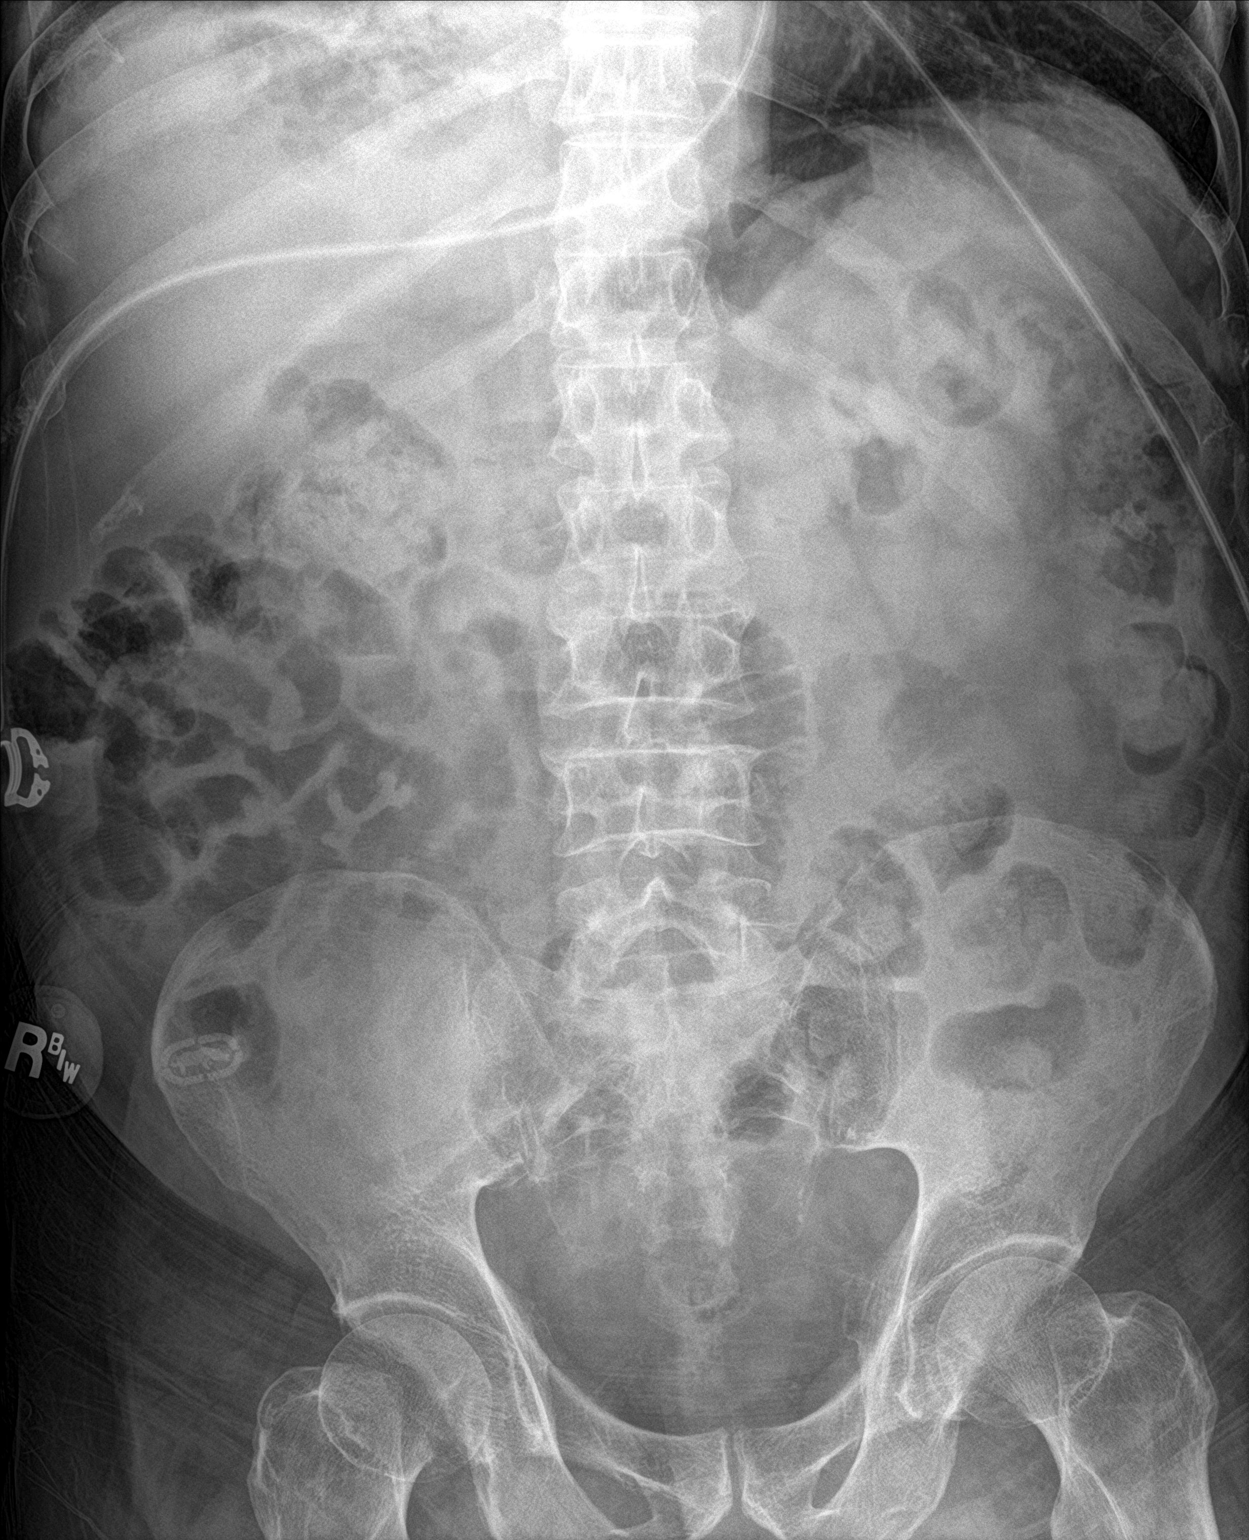

[1 of 1 positions shown; findings below may reference images not displayed]

FINDINGS: The bowel gas pattern is normal. No radio-opaque calculi or other
significant radiographic abnormality are seen. Radio- opaque density
over the right iliac crest is likely external to the patient.
IMPRESSION: Negative.

## 2019-07-18 IMAGING — CT CT CHEST W/O CM
2 of 5 series · 14 of 46 positions shown, 16 images · non-contrast
Comparison: 04/30/2014

CLINICAL DATA: Abdominal pain.  Evaluate for mass.

EXAM:
CT CHEST, ABDOMEN AND PELVIS WITHOUT CONTRAST
TECHNIQUE: Multidetector CT imaging of the chest, abdomen and pelvis was
performed following the standard protocol without IV contrast.

[Series 5: cap w/o 3.0 mm st cor · coronal · non-contrast · 0.68mm/px · 3 of 117 slices shown]
[im 39/117  soft-tissue]
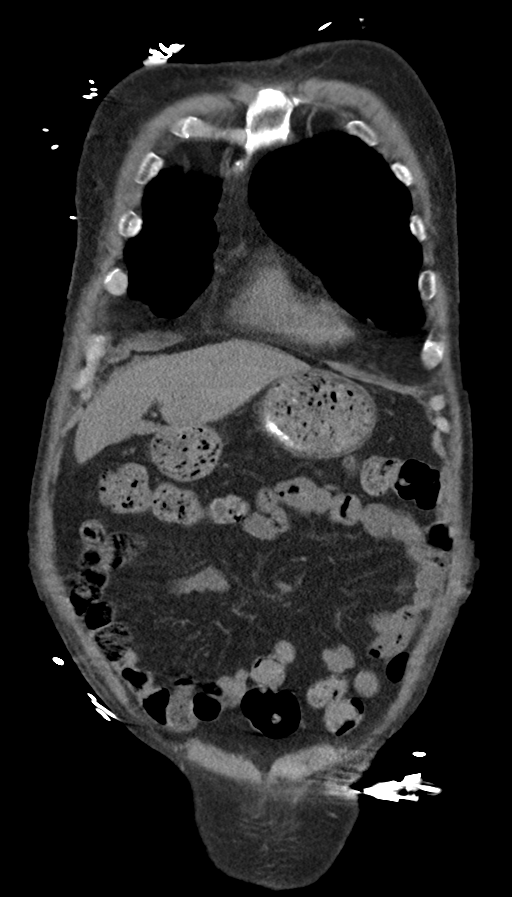
[im 52/117  soft-tissue]
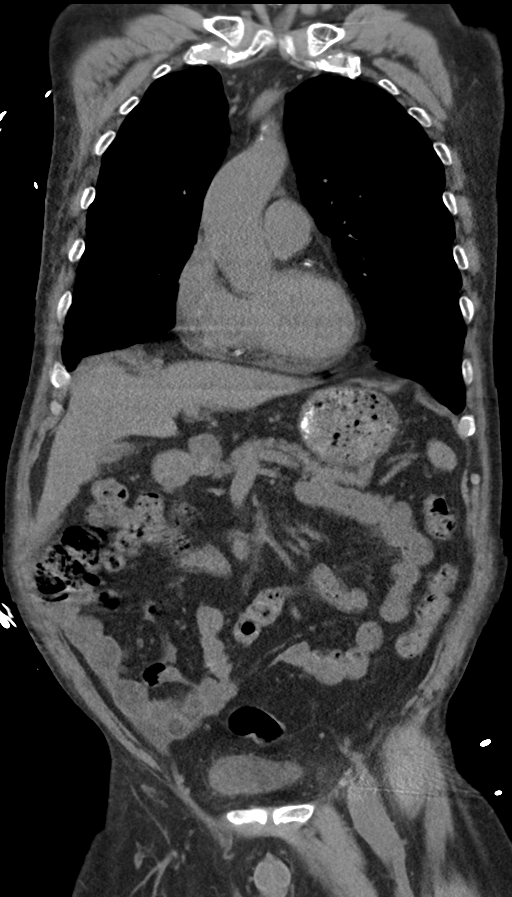
[im 65/117  soft-tissue]
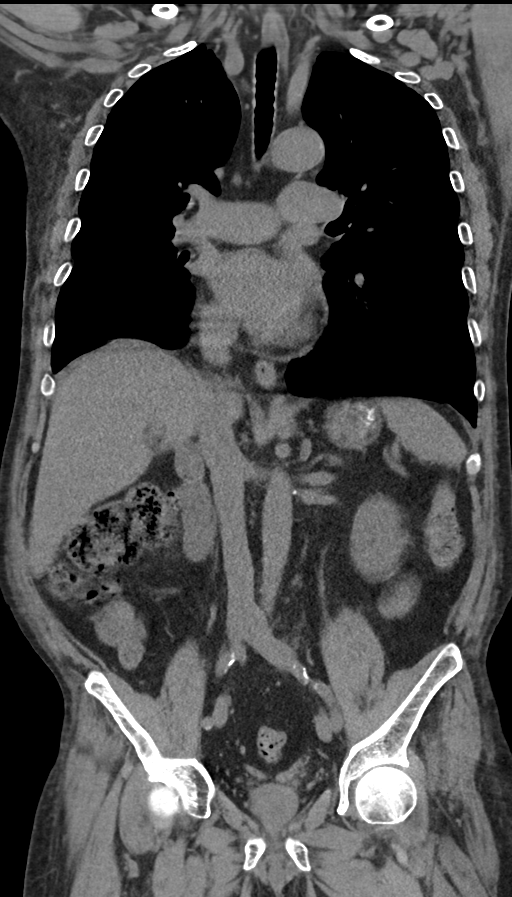

[Series 8: cap w/o 2.0 mm st · axial · non-contrast · 0.72mm/px · z∈[+770,+1304]mm · 11 of 307 slices shown, 13 images]
[im 20/307  soft-tissue]
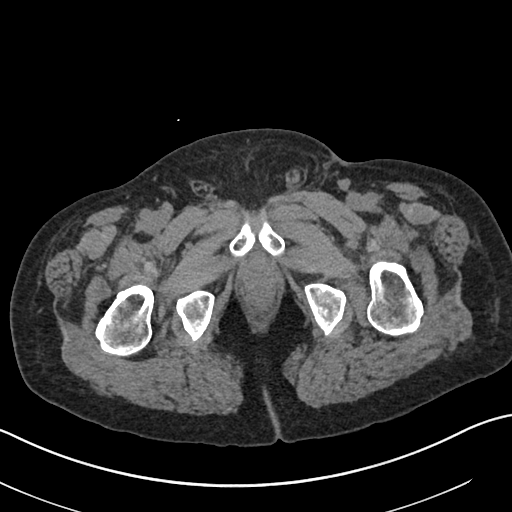
[im 20/307  bone]
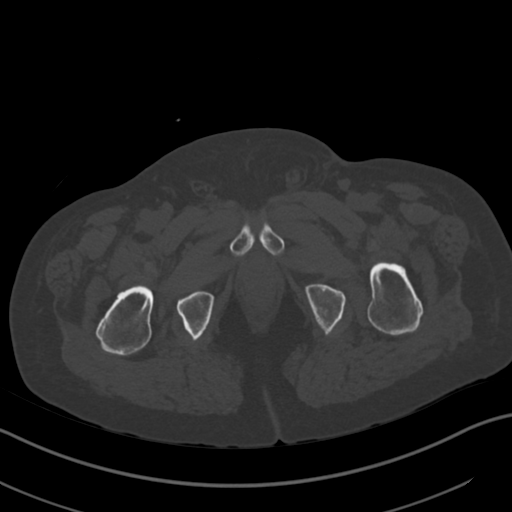
[im 58/307  soft-tissue]
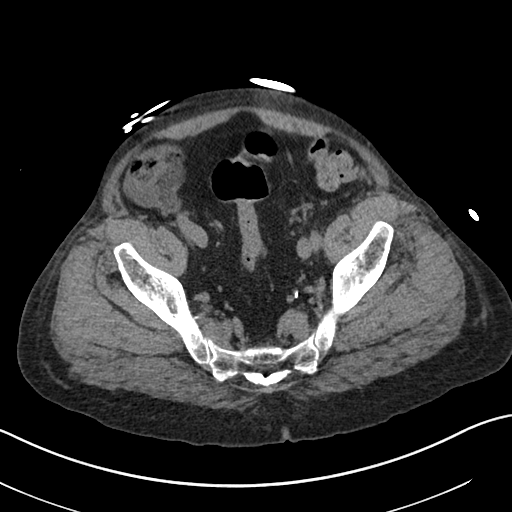
[im 77/307  soft-tissue]
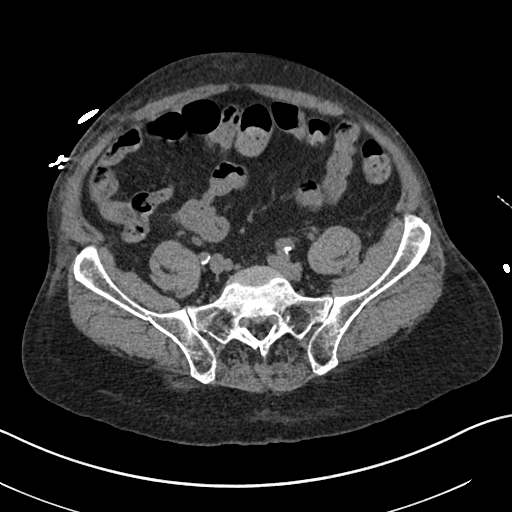
[im 96/307  soft-tissue]
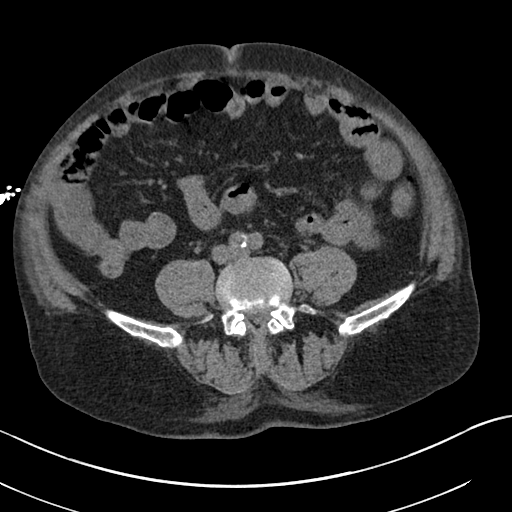
[im 134/307  soft-tissue]
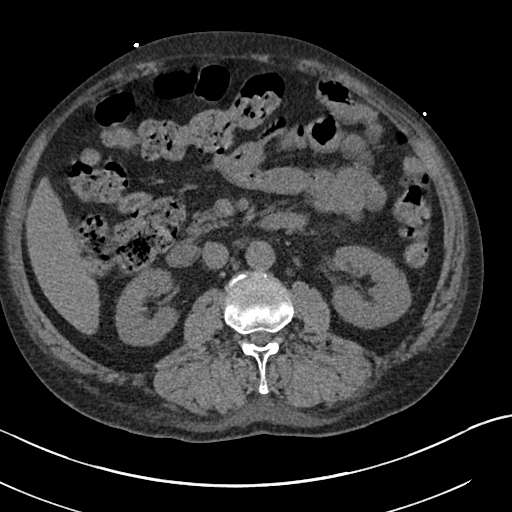
[im 154/307  soft-tissue]
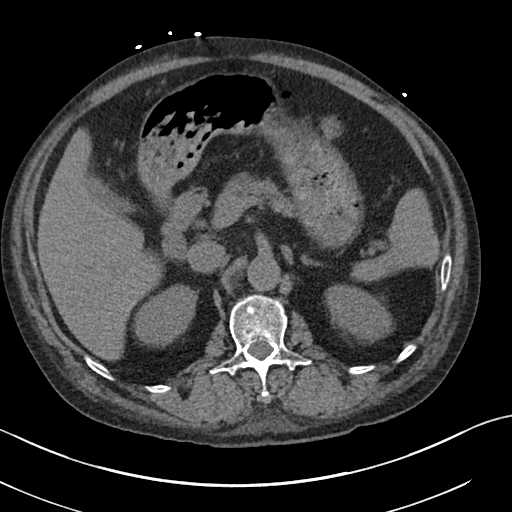
[im 173/307  soft-tissue]
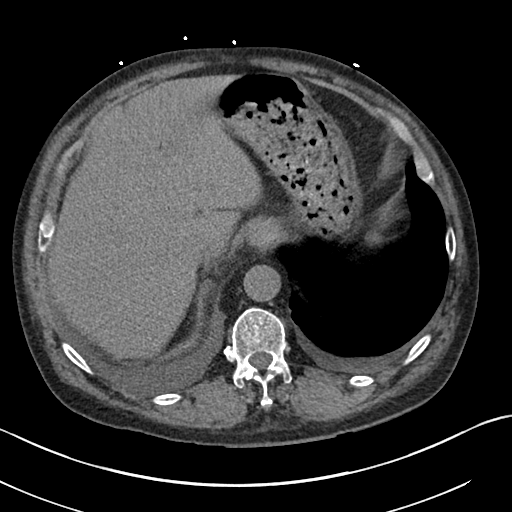
[im 211/307  soft-tissue]
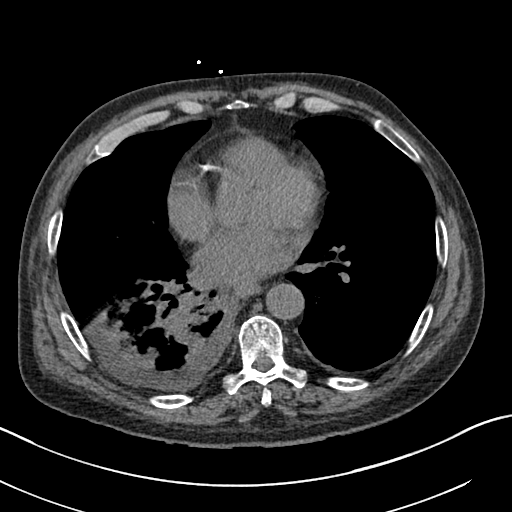
[im 230/307  soft-tissue]
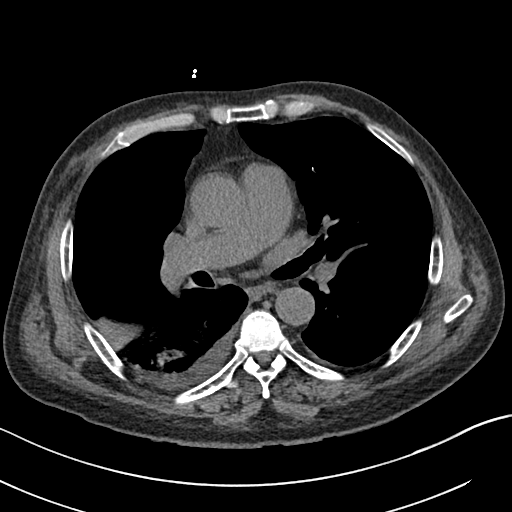
[im 230/307  bone]
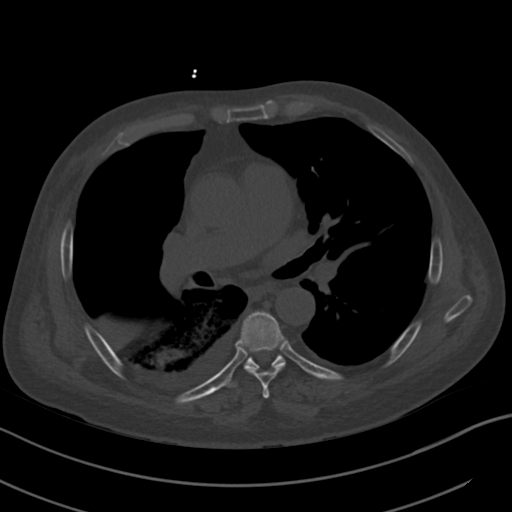
[im 249/307  soft-tissue]
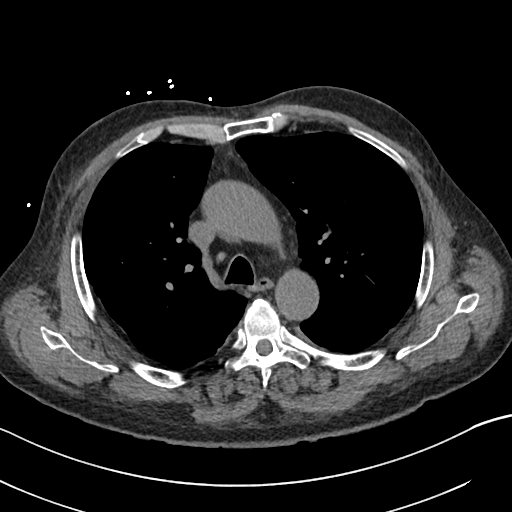
[im 287/307  soft-tissue]
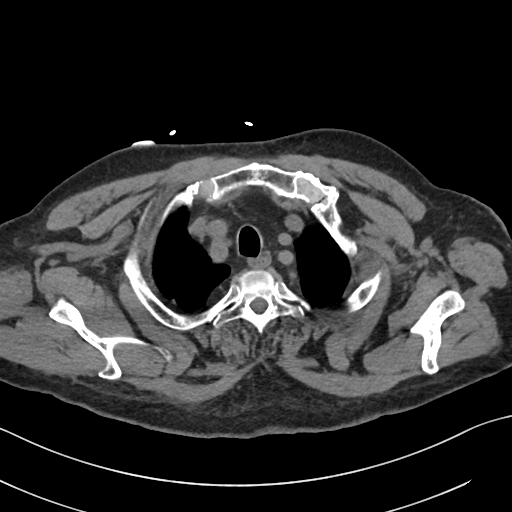

[14 of 46 positions shown; findings below may reference images not displayed]

FINDINGS: CT CHEST FINDINGS

Cardiovascular: The heart size appears within normal limits. There
is aortic atherosclerosis noted. Calcification within the LAD and
RCA coronary artery identified.

Mediastinum/Nodes: No enlarged mediastinal or hilar lymph nodes
identified. No axillary or supraclavicular adenopathy.

Lungs/Pleura: Small to moderate right pleural effusion. Small left
effusion is identified. There is dense airspace consolidation
involving the right lower lobe peer mild changes of centrilobular
emphysema identified.

Musculoskeletal: No aggressive lytic or sclerotic bone lesions
identified.

CT ABDOMEN PELVIS FINDINGS

Hepatobiliary: No focal liver abnormality. The gallbladder is
normal. No biliary dilatation.

Pancreas: Normal appearance of the pancreas.

Spleen: Normal in size without focal abnormality.

Adrenals/Urinary Tract: The kidneys are unremarkable. Urinary
bladder appears normal.

Stomach/Bowel: The stomach is unremarkable. No pathologic dilatation
of the large or small bowel loops. The appendix is visualized and
appears normal.

Vascular/Lymphatic: Aortic atherosclerosis. No aneurysm. No
abdominal or pelvic adenopathy.

Reproductive: Prostate is unremarkable.

Other: No free fluid or fluid collections within the abdomen or
pelvis.

Musculoskeletal: No acute or significant osseous findings.
IMPRESSION: 1. There is dense airspace consolidation in the left lower lobe. In
the acute setting findings are compatible with pneumonia. If there
are signs or symptoms of infection then followup imaging of the
chest following a trial of antibiotic therapy is advised to ensure
resolution and to rule out underlying malignancy. If there are no
symptoms of pneumonia then further investigation with bronchoscopy
may be considered to assess for underlying predisposing lesion.
2. Bilateral pleural effusions.  Right greater than left.
3. Aortic Atherosclerosis ([id number]-[id number].T). Multi vessel coronary
artery calcifications.

## 2020-05-29 IMAGING — CT CT ABD-PELV W/ CM
2 of 5 series · 16 of 46 positions shown, 18 images · IV contrast (ISOVUE)
Comparison: 03/04/2017

CLINICAL DATA: Bladder cancer, status post surgery 03/15/2017.
Receiving radiation chemotherapy. Worsening pelvic pain, dysuria,
abdominal distension

EXAM:
CT ABDOMEN AND PELVIS WITH CONTRAST
TECHNIQUE: Multidetector CT imaging of the abdomen and pelvis was performed
using the standard protocol following bolus administration of
intravenous contrast.
CONTRAST:  100mL FIVCQZ-9EE IOPAMIDOL (FIVCQZ-9EE) INJECTION 61%

[Series 2: axial st · axial · 0.85mm/px · z∈[+1059,+1419]mm · 13 of 86 slices shown, 15 images]
[im 7/86  soft-tissue]
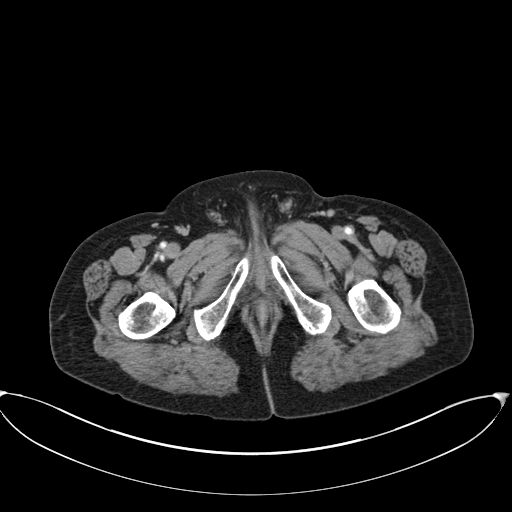
[im 7/86  bone]
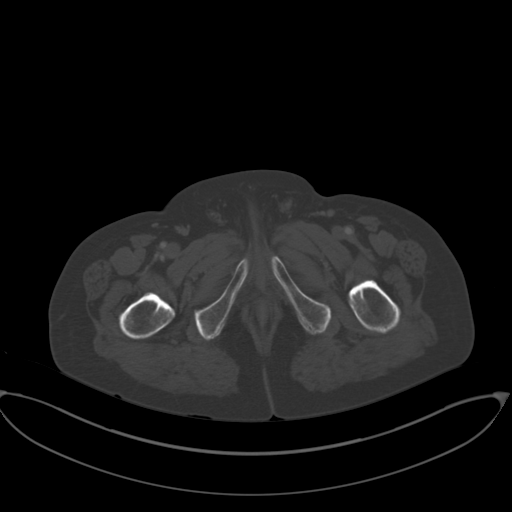
[im 13/86  soft-tissue]
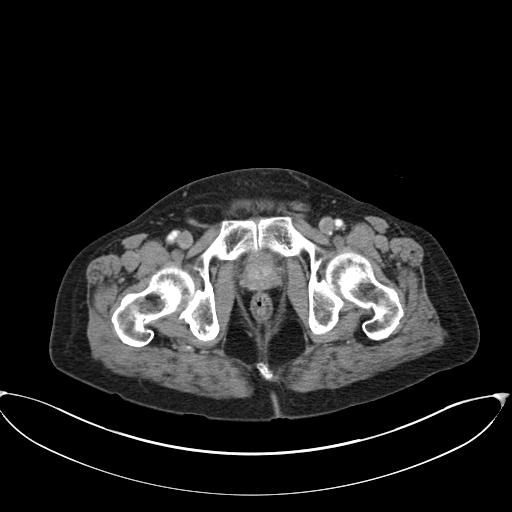
[im 19/86  soft-tissue]
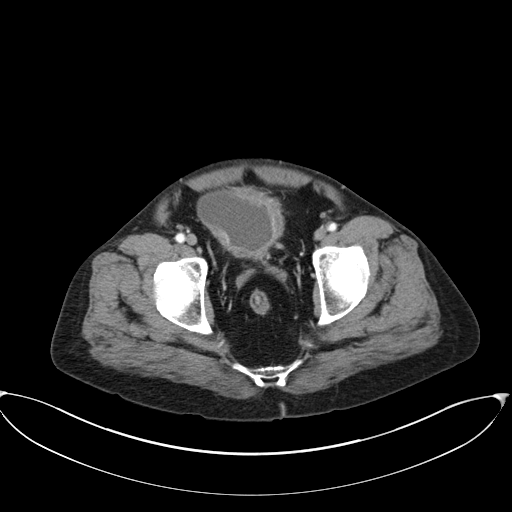
[im 25/86  soft-tissue]
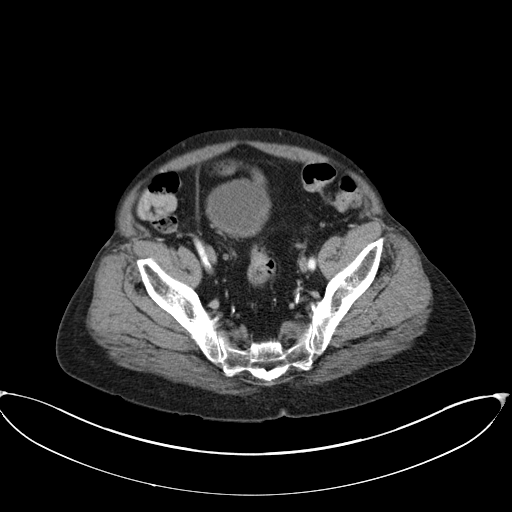
[im 31/86  soft-tissue]
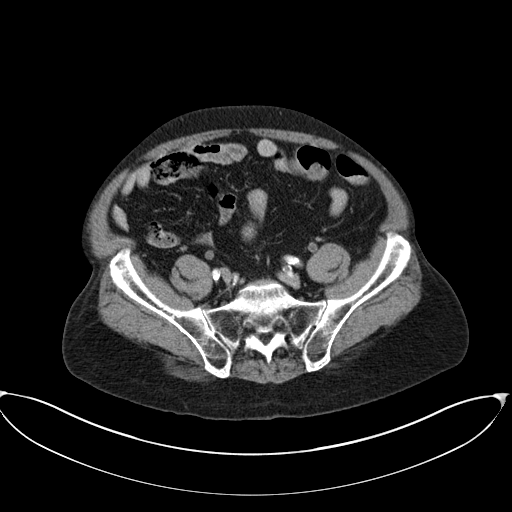
[im 37/86  soft-tissue]
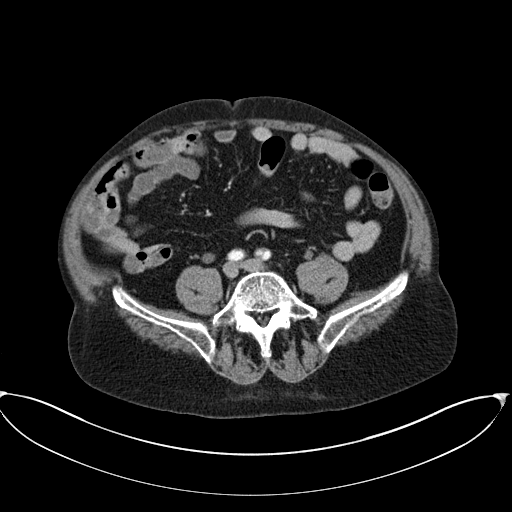
[im 43/86  soft-tissue]
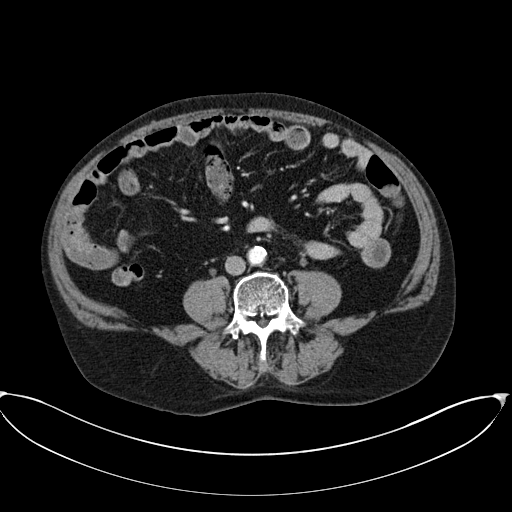
[im 49/86  soft-tissue]
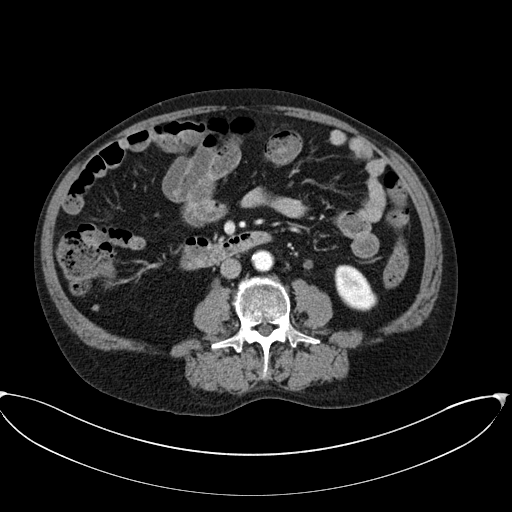
[im 55/86  soft-tissue]
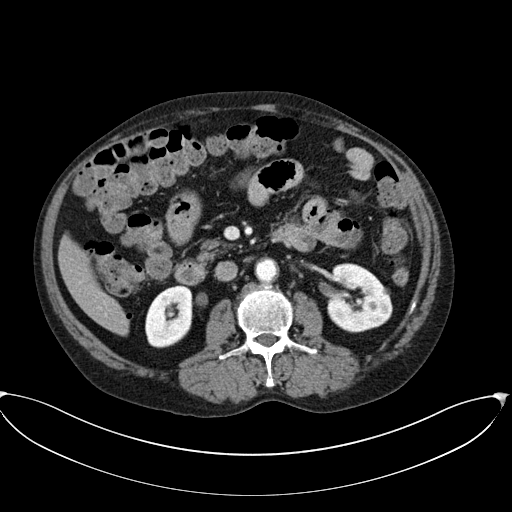
[im 55/86  bone]
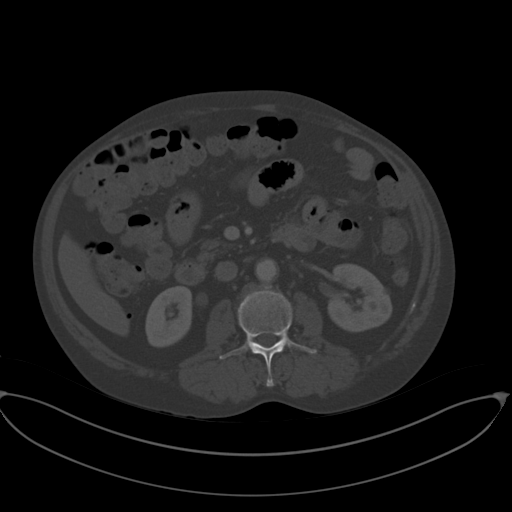
[im 61/86  soft-tissue]
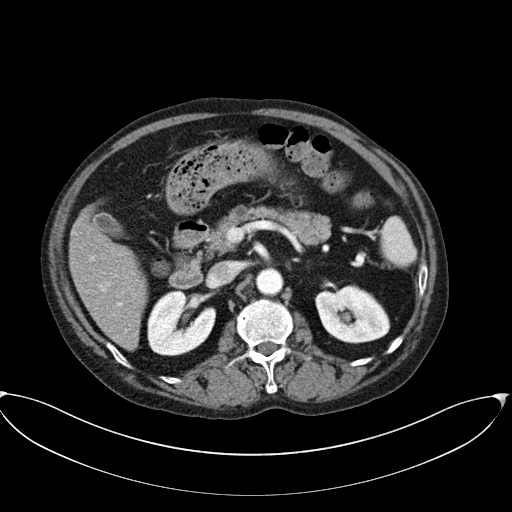
[im 67/86  soft-tissue]
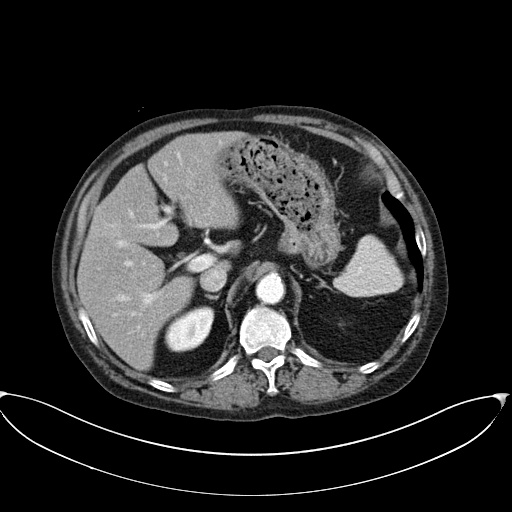
[im 73/86  soft-tissue]
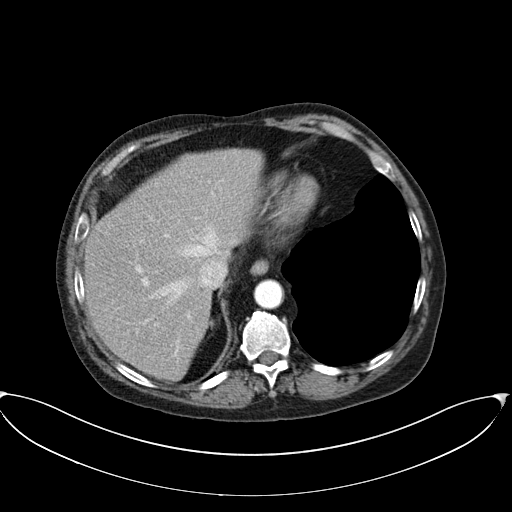
[im 79/86  soft-tissue]
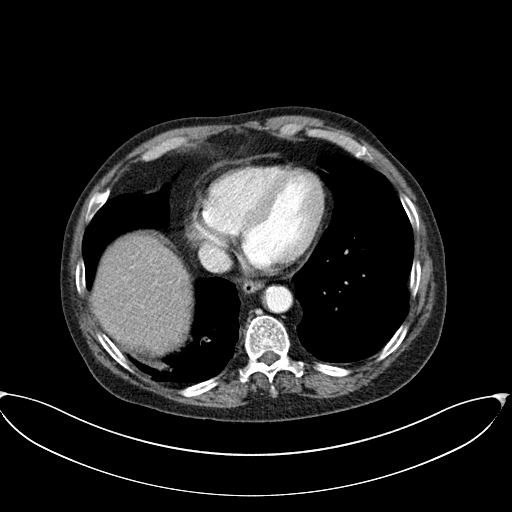

[Series 5: coronal st · coronal · 0.75mm/px · 3 of 101 slices shown]
[im 34/101  soft-tissue]
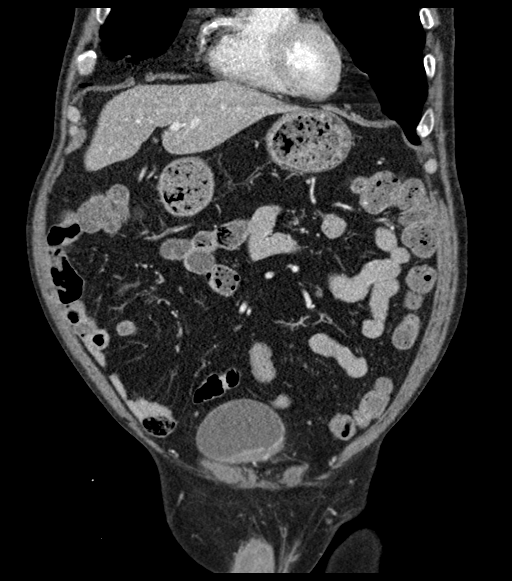
[im 45/101  soft-tissue]
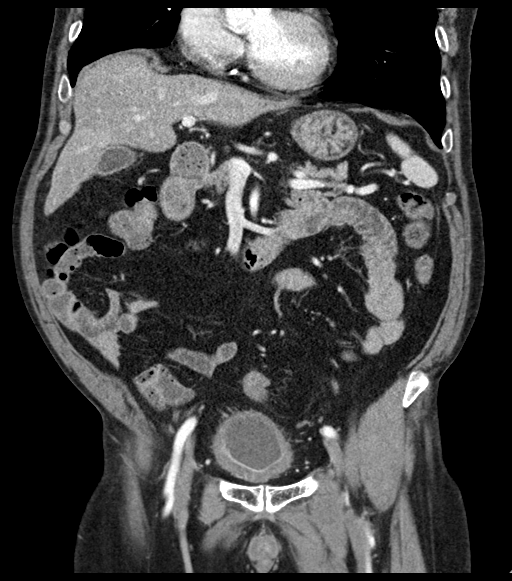
[im 56/101  soft-tissue]
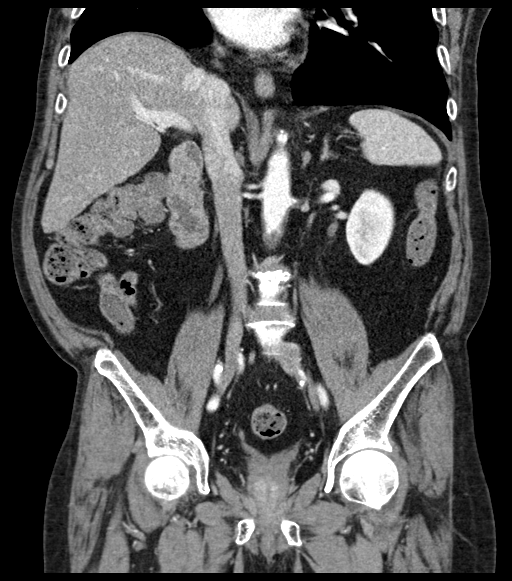

[16 of 46 positions shown; findings below may reference images not displayed]

FINDINGS: Lower chest: Increased right basilar atelectasis. Left lung base
clear. No pericardial or pleural effusion. Normal heart size.

Hepatobiliary: No focal liver abnormality is seen. No gallstones,
gallbladder wall thickening, or biliary dilatation.

Pancreas: Stable 10 mm hypodense pancreas body cystic lesion. No
ductal dilatation. No surrounding inflammatory process.

Spleen: Normal in size without focal abnormality.

Adrenals/Urinary Tract: Normal adrenal glands. Stable scattered
subcentimeter renal cortical cysts bilaterally. No hydronephrosis.
Interval removal of the left ureteral stent. Nearly circumferential
bladder wall thickening with mucosal enhancement compatible with
known bladder cancer. No UVJ abnormality or obstruction. No pelvic
fluid collection, hematoma or abscess.

Stomach/Bowel: Negative for bowel obstruction, significant
dilatation, ileus, or free air. Normal appendix. No fluid collection
or abscess.

Vascular/Lymphatic: Aortic atherosclerosis noted. Negative for
aneurysm. No occlusive disease. Mesenteric and renal vasculature
demonstrate atherosclerosis but remain patent. Iliac atherosclerosis
noted without occlusive disease.

Reproductive: Prostate gland is not enlarged. Seminal vesicles are
atrophic.

Other: No inguinal or abdominal wall hernia.  No ascites.

Musculoskeletal: Bones are osteopenic.  No acute osseous finding.
IMPRESSION: Nearly circumferential diffuse bladder wall thickening with mucosal
enhancement compatible with known bladder cancer. No UVJ
obstruction, hydroureter or hydronephrosis. Interval removal of the
left ureteral stent.

No pelvic fluid collection, abscess, or hematoma.

No other acute intra-abdominal or pelvic finding.

Atherosclerosis without aneurysm

Other ancillary findings as above.
